# Patient Record
Sex: Male | Born: 1950 | Race: White | Hispanic: No | Marital: Married | Smoking: Current every day smoker
Health system: Southern US, Community
[De-identification: ages and names within clinical notes are randomized; demographics above are authoritative.]

## PROBLEM LIST (undated history)

## (undated) DIAGNOSIS — N184 Chronic kidney disease, stage 4 (severe): Secondary | ICD-10-CM

## (undated) DIAGNOSIS — I499 Cardiac arrhythmia, unspecified: Secondary | ICD-10-CM

## (undated) DIAGNOSIS — I471 Supraventricular tachycardia, unspecified: Secondary | ICD-10-CM

## (undated) DIAGNOSIS — I10 Essential (primary) hypertension: Secondary | ICD-10-CM

## (undated) DIAGNOSIS — J40 Bronchitis, not specified as acute or chronic: Secondary | ICD-10-CM

## (undated) DIAGNOSIS — J449 Chronic obstructive pulmonary disease, unspecified: Secondary | ICD-10-CM

## (undated) DIAGNOSIS — I5030 Unspecified diastolic (congestive) heart failure: Secondary | ICD-10-CM

## (undated) DIAGNOSIS — R058 Other specified cough: Secondary | ICD-10-CM

## (undated) DIAGNOSIS — I2510 Atherosclerotic heart disease of native coronary artery without angina pectoris: Secondary | ICD-10-CM

## (undated) DIAGNOSIS — E785 Hyperlipidemia, unspecified: Secondary | ICD-10-CM

## (undated) DIAGNOSIS — T464X5A Adverse effect of angiotensin-converting-enzyme inhibitors, initial encounter: Secondary | ICD-10-CM

## (undated) DIAGNOSIS — M1990 Unspecified osteoarthritis, unspecified site: Secondary | ICD-10-CM

## (undated) DIAGNOSIS — I509 Heart failure, unspecified: Secondary | ICD-10-CM

## (undated) DIAGNOSIS — I779 Disorder of arteries and arterioles, unspecified: Secondary | ICD-10-CM

## (undated) DIAGNOSIS — F419 Anxiety disorder, unspecified: Secondary | ICD-10-CM

## (undated) DIAGNOSIS — J189 Pneumonia, unspecified organism: Secondary | ICD-10-CM

## (undated) DIAGNOSIS — I219 Acute myocardial infarction, unspecified: Secondary | ICD-10-CM

## (undated) DIAGNOSIS — N183 Chronic kidney disease, stage 3 unspecified: Secondary | ICD-10-CM

## (undated) DIAGNOSIS — Z720 Tobacco use: Secondary | ICD-10-CM

## (undated) DIAGNOSIS — I480 Paroxysmal atrial fibrillation: Secondary | ICD-10-CM

## (undated) DIAGNOSIS — I272 Pulmonary hypertension, unspecified: Secondary | ICD-10-CM

## (undated) DIAGNOSIS — D649 Anemia, unspecified: Secondary | ICD-10-CM

## (undated) DIAGNOSIS — E119 Type 2 diabetes mellitus without complications: Secondary | ICD-10-CM

## (undated) DIAGNOSIS — R05 Cough: Secondary | ICD-10-CM

## (undated) HISTORY — DX: Cough: R05

## (undated) HISTORY — DX: Chronic kidney disease, stage 4 (severe): N18.4

## (undated) HISTORY — DX: Supraventricular tachycardia, unspecified: I47.10

## (undated) HISTORY — DX: Disorder of arteries and arterioles, unspecified: I77.9

## (undated) HISTORY — DX: Chronic obstructive pulmonary disease, unspecified: J44.9

## (undated) HISTORY — DX: Supraventricular tachycardia: I47.1

## (undated) HISTORY — DX: Other specified cough: R05.8

## (undated) HISTORY — DX: Paroxysmal atrial fibrillation: I48.0

## (undated) HISTORY — DX: Hyperlipidemia, unspecified: E78.5

## (undated) HISTORY — DX: Type 2 diabetes mellitus without complications: E11.9

## (undated) HISTORY — DX: Unspecified diastolic (congestive) heart failure: I50.30

## (undated) HISTORY — DX: Pulmonary hypertension, unspecified: I27.20

## (undated) HISTORY — DX: Tobacco use: Z72.0

## (undated) HISTORY — DX: Cardiac arrhythmia, unspecified: I49.9

## (undated) HISTORY — DX: Adverse effect of angiotensin-converting-enzyme inhibitors, initial encounter: T46.4X5A

## (undated) HISTORY — DX: Heart failure, unspecified: I50.9

## (undated) HISTORY — DX: Unspecified osteoarthritis, unspecified site: M19.90

## (undated) HISTORY — DX: Chronic kidney disease, stage 3 unspecified: N18.30

## (undated) HISTORY — DX: Anemia, unspecified: D64.9

## (undated) HISTORY — DX: Atherosclerotic heart disease of native coronary artery without angina pectoris: I25.10

## (undated) HISTORY — DX: Essential (primary) hypertension: I10

## (undated) HISTORY — DX: Anxiety disorder, unspecified: F41.9

## (undated) HISTORY — PX: ESOPHAGOGASTRODUODENOSCOPY ENDOSCOPY: SHX5814

---
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
## 1999-07-29 DIAGNOSIS — I2510 Atherosclerotic heart disease of native coronary artery without angina pectoris: Secondary | ICD-10-CM

## 1999-07-29 HISTORY — DX: Atherosclerotic heart disease of native coronary artery without angina pectoris: I25.10

## 2000-05-06 HISTORY — PX: CARDIAC CATHETERIZATION: SHX172

## 2001-04-27 ENCOUNTER — Encounter: Payer: Self-pay | Admitting: Family Medicine

## 2001-04-27 LAB — CONVERTED CEMR LAB: PSA: 0.4 ng/mL

## 2001-11-25 ENCOUNTER — Encounter: Payer: Self-pay | Admitting: Family Medicine

## 2002-07-28 ENCOUNTER — Encounter: Payer: Self-pay | Admitting: Family Medicine

## 2003-04-28 ENCOUNTER — Encounter: Payer: Self-pay | Admitting: Family Medicine

## 2003-08-29 ENCOUNTER — Encounter: Payer: Self-pay | Admitting: Family Medicine

## 2004-06-18 ENCOUNTER — Ambulatory Visit: Payer: Self-pay | Admitting: Physician Assistant

## 2004-07-18 ENCOUNTER — Other Ambulatory Visit: Payer: Self-pay

## 2004-07-25 ENCOUNTER — Ambulatory Visit: Payer: Self-pay | Admitting: Unknown Physician Specialty

## 2004-07-25 HISTORY — PX: KNEE ARTHROSCOPY: SUR90

## 2004-09-25 ENCOUNTER — Encounter: Payer: Self-pay | Admitting: Family Medicine

## 2004-09-25 LAB — CONVERTED CEMR LAB
Hgb A1c MFr Bld: 7.8 %
Hgb A1c MFr Bld: 7.8 %

## 2004-09-27 ENCOUNTER — Ambulatory Visit: Payer: Self-pay | Admitting: Family Medicine

## 2004-11-01 ENCOUNTER — Ambulatory Visit: Payer: Self-pay | Admitting: Family Medicine

## 2004-11-22 ENCOUNTER — Ambulatory Visit: Payer: Self-pay | Admitting: Family Medicine

## 2004-12-26 ENCOUNTER — Encounter: Payer: Self-pay | Admitting: Family Medicine

## 2005-01-01 ENCOUNTER — Ambulatory Visit: Payer: Self-pay | Admitting: Family Medicine

## 2005-01-03 ENCOUNTER — Ambulatory Visit: Payer: Self-pay | Admitting: Family Medicine

## 2005-02-25 ENCOUNTER — Encounter: Payer: Self-pay | Admitting: Family Medicine

## 2005-02-25 LAB — CONVERTED CEMR LAB: PSA: 0.5 ng/mL

## 2005-03-14 ENCOUNTER — Ambulatory Visit: Payer: Self-pay | Admitting: Family Medicine

## 2005-03-18 ENCOUNTER — Ambulatory Visit: Payer: Self-pay | Admitting: Family Medicine

## 2005-04-25 ENCOUNTER — Ambulatory Visit: Payer: Self-pay | Admitting: Family Medicine

## 2005-08-28 ENCOUNTER — Encounter: Payer: Self-pay | Admitting: Family Medicine

## 2005-08-28 LAB — CONVERTED CEMR LAB
Hgb A1c MFr Bld: 6.7 %
Hgb A1c MFr Bld: 6.7 %

## 2005-09-16 ENCOUNTER — Ambulatory Visit: Payer: Self-pay | Admitting: Family Medicine

## 2005-09-18 ENCOUNTER — Ambulatory Visit: Payer: Self-pay | Admitting: Family Medicine

## 2006-06-27 ENCOUNTER — Encounter: Payer: Self-pay | Admitting: Family Medicine

## 2006-06-27 LAB — CONVERTED CEMR LAB
Hgb A1c MFr Bld: 7.1 %
Microalbumin U total vol: 3.2 mg/L

## 2006-07-06 ENCOUNTER — Ambulatory Visit: Payer: Self-pay | Admitting: Family Medicine

## 2006-07-08 ENCOUNTER — Ambulatory Visit: Payer: Self-pay | Admitting: Family Medicine

## 2006-08-06 ENCOUNTER — Ambulatory Visit: Payer: Self-pay | Admitting: Family Medicine

## 2007-01-06 ENCOUNTER — Ambulatory Visit: Payer: Self-pay | Admitting: Family Medicine

## 2007-01-06 DIAGNOSIS — IMO0002 Reserved for concepts with insufficient information to code with codable children: Secondary | ICD-10-CM | POA: Insufficient documentation

## 2007-01-06 DIAGNOSIS — E1159 Type 2 diabetes mellitus with other circulatory complications: Secondary | ICD-10-CM

## 2007-01-06 DIAGNOSIS — E119 Type 2 diabetes mellitus without complications: Secondary | ICD-10-CM | POA: Insufficient documentation

## 2007-01-06 DIAGNOSIS — N521 Erectile dysfunction due to diseases classified elsewhere: Secondary | ICD-10-CM

## 2007-01-06 DIAGNOSIS — F17200 Nicotine dependence, unspecified, uncomplicated: Secondary | ICD-10-CM | POA: Insufficient documentation

## 2007-01-06 DIAGNOSIS — R454 Irritability and anger: Secondary | ICD-10-CM | POA: Insufficient documentation

## 2007-01-06 DIAGNOSIS — E785 Hyperlipidemia, unspecified: Secondary | ICD-10-CM | POA: Insufficient documentation

## 2007-01-06 DIAGNOSIS — Z720 Tobacco use: Secondary | ICD-10-CM | POA: Insufficient documentation

## 2007-01-06 DIAGNOSIS — J449 Chronic obstructive pulmonary disease, unspecified: Secondary | ICD-10-CM | POA: Insufficient documentation

## 2007-01-06 DIAGNOSIS — E1165 Type 2 diabetes mellitus with hyperglycemia: Secondary | ICD-10-CM

## 2007-01-06 LAB — CONVERTED CEMR LAB: Hgb A1c MFr Bld: 8.3 % — ABNORMAL HIGH (ref 4.6–6.0)

## 2007-01-07 DIAGNOSIS — I10 Essential (primary) hypertension: Secondary | ICD-10-CM | POA: Insufficient documentation

## 2007-01-08 ENCOUNTER — Encounter (INDEPENDENT_AMBULATORY_CARE_PROVIDER_SITE_OTHER): Payer: Self-pay | Admitting: *Deleted

## 2007-01-12 ENCOUNTER — Telehealth (INDEPENDENT_AMBULATORY_CARE_PROVIDER_SITE_OTHER): Payer: Self-pay | Admitting: *Deleted

## 2007-01-12 ENCOUNTER — Ambulatory Visit: Payer: Self-pay | Admitting: Family Medicine

## 2007-03-09 ENCOUNTER — Ambulatory Visit: Payer: Self-pay | Admitting: Oncology

## 2007-06-16 ENCOUNTER — Telehealth: Payer: Self-pay | Admitting: Family Medicine

## 2007-07-01 ENCOUNTER — Ambulatory Visit: Payer: Self-pay | Admitting: Family Medicine

## 2007-07-01 LAB — CONVERTED CEMR LAB
ALT: 32 units/L (ref 0–53)
AST: 23 units/L (ref 0–37)
Albumin: 4 g/dL (ref 3.5–5.2)
Alkaline Phosphatase: 44 units/L (ref 39–117)
BUN: 11 mg/dL (ref 6–23)
Basophils Absolute: 0 10*3/uL (ref 0.0–0.1)
Calcium: 9.3 mg/dL (ref 8.4–10.5)
Chloride: 102 meq/L (ref 96–112)
Creatinine, Ser: 1.2 mg/dL (ref 0.4–1.5)
Direct LDL: 96.8 mg/dL
GFR calc non Af Amer: 66 mL/min
HCT: 46.5 % (ref 39.0–52.0)
HDL: 25.6 mg/dL — ABNORMAL LOW (ref >39.0)
Hgb A1c MFr Bld: 8.4 % — ABNORMAL HIGH (ref 4.6–6.0)
MCHC: 35.3 g/dL (ref 30.0–36.0)
Monocytes Relative: 8.1 % (ref 3.0–11.0)
Neutrophils Relative %: 56.2 % (ref 43.0–77.0)
PSA: 0.4 ng/mL (ref 0.10–4.00)
Platelets: 238 10*3/uL (ref 150–400)
RBC: 5.39 M/uL (ref 4.22–5.81)
RDW: 12.9 % (ref 11.5–14.6)
Total Bilirubin: 0.7 mg/dL (ref 0.3–1.2)
Total CHOL/HDL Ratio: 6
Triglycerides: 214 mg/dL (ref 0–149)

## 2007-07-27 ENCOUNTER — Ambulatory Visit: Payer: Self-pay | Admitting: Family Medicine

## 2007-08-05 ENCOUNTER — Telehealth: Payer: Self-pay | Admitting: Family Medicine

## 2007-10-18 ENCOUNTER — Ambulatory Visit: Payer: Self-pay | Admitting: Family Medicine

## 2007-10-18 LAB — CONVERTED CEMR LAB: Hgb A1c MFr Bld: 8.1 % — ABNORMAL HIGH (ref 4.6–6.0)

## 2007-10-22 ENCOUNTER — Telehealth: Payer: Self-pay | Admitting: Internal Medicine

## 2007-10-25 ENCOUNTER — Ambulatory Visit: Payer: Self-pay | Admitting: Family Medicine

## 2007-10-28 ENCOUNTER — Telehealth: Payer: Self-pay | Admitting: Family Medicine

## 2007-12-06 ENCOUNTER — Telehealth: Payer: Self-pay | Admitting: Family Medicine

## 2007-12-22 ENCOUNTER — Telehealth: Payer: Self-pay | Admitting: Family Medicine

## 2007-12-28 ENCOUNTER — Telehealth: Payer: Self-pay | Admitting: Family Medicine

## 2008-01-20 ENCOUNTER — Ambulatory Visit: Payer: Self-pay | Admitting: Family Medicine

## 2008-01-20 LAB — CONVERTED CEMR LAB: Hgb A1c MFr Bld: 8.3 % — ABNORMAL HIGH (ref 4.6–6.0)

## 2008-01-25 ENCOUNTER — Ambulatory Visit: Payer: Self-pay | Admitting: Family Medicine

## 2008-05-17 ENCOUNTER — Ambulatory Visit: Payer: Self-pay | Admitting: Family Medicine

## 2008-06-26 ENCOUNTER — Ambulatory Visit: Payer: Self-pay | Admitting: Family Medicine

## 2008-06-26 LAB — CONVERTED CEMR LAB
ALT: 34 units/L (ref 0–53)
AST: 20 units/L (ref 0–37)
Albumin: 4 g/dL (ref 3.5–5.2)
BUN: 14 mg/dL (ref 6–23)
Basophils Absolute: 0 10*3/uL (ref 0.0–0.1)
Basophils Relative: 0.4 % (ref 0.0–3.0)
CO2: 31 meq/L (ref 19–32)
Calcium: 9.3 mg/dL (ref 8.4–10.5)
Chloride: 103 meq/L (ref 96–112)
Creatinine, Ser: 1.2 mg/dL (ref 0.4–1.5)
Creatinine,U: 48.4 mg/dL
Eosinophils Absolute: 0.2 10*3/uL (ref 0.0–0.7)
Eosinophils Relative: 2.5 % (ref 0.0–5.0)
Hemoglobin: 16.9 g/dL (ref 13.0–17.0)
Lymphocytes Relative: 33.1 % (ref 12.0–46.0)
MCHC: 34 g/dL (ref 30.0–36.0)
MCV: 86.4 fL (ref 78.0–100.0)
Microalb, Ur: 0.6 mg/dL (ref 0.0–1.9)
Neutro Abs: 3.8 10*3/uL (ref 1.4–7.7)
Neutrophils Relative %: 56.2 % (ref 43.0–77.0)
RBC: 5.76 M/uL (ref 4.22–5.81)
Total Bilirubin: 0.6 mg/dL (ref 0.3–1.2)
Total Protein: 6.9 g/dL (ref 6.0–8.3)
WBC: 6.7 10*3/uL (ref 4.5–10.5)

## 2008-06-29 ENCOUNTER — Ambulatory Visit: Payer: Self-pay | Admitting: Family Medicine

## 2008-07-10 ENCOUNTER — Telehealth: Payer: Self-pay | Admitting: Family Medicine

## 2008-07-12 ENCOUNTER — Telehealth: Payer: Self-pay | Admitting: Family Medicine

## 2008-07-20 ENCOUNTER — Ambulatory Visit: Payer: Self-pay | Admitting: Family Medicine

## 2008-07-20 ENCOUNTER — Encounter (INDEPENDENT_AMBULATORY_CARE_PROVIDER_SITE_OTHER): Payer: Self-pay | Admitting: *Deleted

## 2008-07-20 LAB — CONVERTED CEMR LAB
OCCULT 1: NEGATIVE
OCCULT 2: NEGATIVE
OCCULT 3: NEGATIVE

## 2008-08-07 ENCOUNTER — Ambulatory Visit: Payer: Self-pay | Admitting: Family Medicine

## 2008-08-07 LAB — CONVERTED CEMR LAB: Hgb A1c MFr Bld: 7.7 % — ABNORMAL HIGH (ref 4.6–6.0)

## 2008-08-10 ENCOUNTER — Ambulatory Visit: Payer: Self-pay | Admitting: Family Medicine

## 2008-10-31 ENCOUNTER — Ambulatory Visit: Payer: Self-pay | Admitting: Family Medicine

## 2008-12-14 ENCOUNTER — Telehealth: Payer: Self-pay | Admitting: Family Medicine

## 2009-01-02 ENCOUNTER — Ambulatory Visit: Payer: Self-pay | Admitting: Family Medicine

## 2009-01-18 ENCOUNTER — Telehealth: Payer: Self-pay | Admitting: Family Medicine

## 2009-04-26 ENCOUNTER — Telehealth: Payer: Self-pay | Admitting: Family Medicine

## 2009-07-09 ENCOUNTER — Telehealth: Payer: Self-pay | Admitting: Family Medicine

## 2009-07-13 ENCOUNTER — Ambulatory Visit: Payer: Self-pay | Admitting: Family Medicine

## 2009-07-19 ENCOUNTER — Encounter: Payer: Self-pay | Admitting: Family Medicine

## 2009-09-06 ENCOUNTER — Telehealth: Payer: Self-pay | Admitting: Family Medicine

## 2009-09-17 ENCOUNTER — Telehealth: Payer: Self-pay | Admitting: Family Medicine

## 2009-10-12 ENCOUNTER — Telehealth: Payer: Self-pay | Admitting: Family Medicine

## 2009-11-19 ENCOUNTER — Telehealth: Payer: Self-pay | Admitting: Family Medicine

## 2009-11-21 ENCOUNTER — Ambulatory Visit: Payer: Self-pay | Admitting: Family Medicine

## 2009-11-21 LAB — CONVERTED CEMR LAB
AST: 24 units/L (ref 0–37)
Albumin: 4.2 g/dL (ref 3.5–5.2)
BUN: 14 mg/dL (ref 6–23)
Basophils Absolute: 0.1 10*3/uL (ref 0.0–0.1)
Calcium: 9.4 mg/dL (ref 8.4–10.5)
Cholesterol: 169 mg/dL (ref 0–200)
Creatinine, Ser: 1.2 mg/dL (ref 0.4–1.5)
Creatinine,U: 87.7 mg/dL
Eosinophils Absolute: 0.2 10*3/uL (ref 0.0–0.7)
GFR calc non Af Amer: 65.57 mL/min (ref >60)
Glucose, Bld: 173 mg/dL — ABNORMAL HIGH (ref 70–99)
Hemoglobin: 16.5 g/dL (ref 13.0–17.0)
Hgb A1c MFr Bld: 8.4 % — ABNORMAL HIGH (ref 4.6–6.5)
Lymphocytes Relative: 34.2 % (ref 12.0–46.0)
Lymphs Abs: 2.4 10*3/uL (ref 0.7–4.0)
MCHC: 34 g/dL (ref 30.0–36.0)
Microalb, Ur: 1.1 mg/dL (ref 0.0–1.9)
Neutro Abs: 4 10*3/uL (ref 1.4–7.7)
Platelets: 253 10*3/uL (ref 150.0–400.0)
Potassium: 4.8 meq/L (ref 3.5–5.1)
RDW: 14.1 % (ref 11.5–14.6)
TSH: 1.65 microintl units/mL (ref 0.35–5.50)
Total Bilirubin: 0.6 mg/dL (ref 0.3–1.2)
Triglycerides: 265 mg/dL — ABNORMAL HIGH (ref 0.0–149.0)
VLDL: 53 mg/dL — ABNORMAL HIGH (ref 0.0–40.0)

## 2009-11-28 ENCOUNTER — Ambulatory Visit: Payer: Self-pay | Admitting: Family Medicine

## 2009-12-26 ENCOUNTER — Ambulatory Visit: Payer: Self-pay | Admitting: Family Medicine

## 2009-12-26 ENCOUNTER — Encounter: Payer: Self-pay | Admitting: Family Medicine

## 2010-01-01 ENCOUNTER — Ambulatory Visit: Payer: Self-pay | Admitting: Family Medicine

## 2010-01-01 LAB — CONVERTED CEMR LAB
OCCULT 2: NEGATIVE
OCCULT 3: NEGATIVE

## 2010-01-01 LAB — FECAL OCCULT BLOOD, GUAIAC: Fecal Occult Blood: NEGATIVE

## 2010-01-02 ENCOUNTER — Encounter (INDEPENDENT_AMBULATORY_CARE_PROVIDER_SITE_OTHER): Payer: Self-pay | Admitting: *Deleted

## 2010-01-29 ENCOUNTER — Telehealth: Payer: Self-pay | Admitting: Family Medicine

## 2010-02-26 ENCOUNTER — Ambulatory Visit: Payer: Self-pay | Admitting: Family Medicine

## 2010-03-04 ENCOUNTER — Telehealth: Payer: Self-pay | Admitting: Family Medicine

## 2010-03-05 ENCOUNTER — Ambulatory Visit: Payer: Self-pay | Admitting: Family Medicine

## 2010-03-05 LAB — HM DIABETES FOOT EXAM

## 2010-03-13 ENCOUNTER — Telehealth: Payer: Self-pay | Admitting: Family Medicine

## 2010-03-25 ENCOUNTER — Telehealth: Payer: Self-pay | Admitting: Family Medicine

## 2010-04-17 ENCOUNTER — Ambulatory Visit: Payer: Self-pay | Admitting: Gastroenterology

## 2010-04-17 ENCOUNTER — Encounter (INDEPENDENT_AMBULATORY_CARE_PROVIDER_SITE_OTHER): Payer: Self-pay | Admitting: *Deleted

## 2010-05-29 ENCOUNTER — Telehealth: Payer: Self-pay | Admitting: Family Medicine

## 2010-06-03 ENCOUNTER — Ambulatory Visit: Payer: Self-pay | Admitting: Gastroenterology

## 2010-06-03 LAB — HM COLONOSCOPY

## 2010-06-05 ENCOUNTER — Encounter: Payer: Self-pay | Admitting: Gastroenterology

## 2010-08-01 ENCOUNTER — Telehealth: Payer: Self-pay | Admitting: Family Medicine

## 2010-08-09 ENCOUNTER — Telehealth: Payer: Self-pay | Admitting: Family Medicine

## 2010-08-28 NOTE — Progress Notes (Signed)
Summary: refill request for xanax  Phone Note Refill Request   Refills Requested: Medication #1:  XANAX 0.5 MG TABS Take 1 tablet by mouth every six hours as needed   Last Refilled: 11/19/2009 Faxed request from [facility], [phone].  Initial call taken by: Marty Heck CMA,  January 29, 2010 9:47 AM  Follow-up for Phone Call        Medication phoned to pharmacy.  Follow-up by: Christena Deem CMA Deborra Medina),  January 29, 2010 2:31 PM    Prescriptions: XANAX 0.5 MG TABS (ALPRAZOLAM) Take 1 tablet by mouth every six hours as needed  #50 x 0   Entered and Authorized by:   Elsie Stain MD   Signed by:   Elsie Stain MD on 01/29/2010   Method used:   Telephoned to ...       [id number]       [address], [place]  [address]       Ph: [phone] or [id number]       Fax: [phone]   RxID:   [id number]

## 2010-08-28 NOTE — Progress Notes (Signed)
Summary: fenofibrate  Phone Note Refill Request Call back at Home Phone [phone] Message from:  Patient on March 13, 2010 9:27 AM  Refills Requested: Medication #1:  FENOFIBRATE 160 MG TABS one tab by mouth daily Patient was given rx on 5-4, patient says that he never had it filled and lost the rx. He is [facility] [address]   Initial call taken by: Lacretia Nicks,  March 13, 2010 9:27 AM    Prescriptions: FENOFIBRATE 160 MG TABS (FENOFIBRATE) one tab by mouth daily  #90 x 3   Entered and Authorized by:   Elsie Stain MD   Signed by:   Elsie Stain MD on 03/13/2010   Method used:   [facility]ronically [facility]        [id number]       [address], [place]  [address]       Ph: [phone] or [id number]       Fax: [phone]   RxID:   [id number]

## 2010-08-28 NOTE — Letter (Signed)
Summary: Results Follow up Letter  [place] [facility]  [address] [place], [place] [address]   Phone: [phone]  Fax: [phone]    01/02/2010 MRN: [id number]  [facility] [address] [place], [place]  [address]  Dear Mr. Braniff,  The following are the results of your recent test(s):  Test         Result    Pap Smear:        Normal _____  Not Normal _____ Comments: ______________________________________________________ Cholesterol: LDL(Bad cholesterol):         Your goal is less than:         HDL (Good cholesterol):       Your goal is more than: Comments:  ______________________________________________________ Mammogram:        Normal _____  Not Normal _____ Comments:  ___________________________________________________________________ Hemoccult:        Normal ___X__  Not normal _______ Comments:  Please repeat in one year.  _____________________________________________________________________ Other Tests:    We routinely do not discuss normal results over the telephone.  If you desire a copy of the results, or you have any questions about this information we can discuss them at your next office visit.   Sincerely,    Teresa Pelton, MD

## 2010-08-28 NOTE — Letter (Signed)
Summary: Anticoagulation Modification Letter  [facility]  [place], [place] [address]   Phone: [phone]  Fax: [phone]    April 17, 2010  Re:    Joshua Roy DOB:    1950-12-05 MRN:  [id number]    Dear Dr. Damita Dunnings:  We have scheduled the above patient for an endoscopic procedure with Dr. Ardis Hughs. Our records show that he is on anticoagulation therapy. Please advise as to how long the patient may come off their therapy of Plavix prior to the scheduled procedure on June 03, 2010   Please fax back the completed form to Christian Mate, [place] at [phone].  Thank you for your help with this matter.  Sincerely,  Abelino Derrick CMA Deborra Medina)   Physician Recommendation:  Hold Plavix 5 days prior ________________   Appended Document: Anticoagulation Modification Letter Pt is to hold plavix for 5 days before procedure.  Please fax back.   Appended Document: Anticoagulation Modification Letter Faxed.  Appended Document: Anticoagulation Modification Letter not sure if you saw this was returned  Appended Document: Anticoagulation Modification Letter left message on machine to call back   Appended Document: Anticoagulation Modification Letter pt aware of the plavix hold

## 2010-08-28 NOTE — Progress Notes (Signed)
Summary: Rx Xanax  Phone Note Refill Request Call back at [phone] Message from:  Gerrard on November 19, 2009 9:33 AM  Refills Requested: Medication #1:  XANAX 0.5 MG TABS Take 1 tablet by mouth every six hours as needed   Last Refilled: 10/12/2009 Received faxed refill request   Method Requested: Telephone to Pharmacy Initial call taken by: Sherrian Divers CMA Deborra Medina),  November 19, 2009 9:33 AM  Follow-up for Phone Call        Pls let pt know, he needs to be seen for next refill. Follow-up by: Raenette Rover MD,  November 19, 2009 1:12 PM  Additional Follow-up for Phone Call Additional follow up Details #1::        Rx called to pharmacy, left message on machine at home for patient to return call.  Sherrian Divers CMA Deborra Medina)  November 19, 2009 1:25 PM   Advised pt's wife.         Marty Heck CMA  November 20, 2009 8:19 AM     Prescriptions: Duanne Moron 0.5 MG TABS (ALPRAZOLAM) Take 1 tablet by mouth every six hours as needed  #50 x 0   Entered and Authorized by:   Raenette Rover MD   Signed by:   Raenette Rover MD on 11/19/2009   Method used:   Telephoned to ...       [id number]       [address] [place], [place]  [address]       Ph: [phone] or [id number]       Fax: [phone]   RxID:   [id number]

## 2010-08-28 NOTE — Progress Notes (Signed)
Summary: refill request for alprazolam  Phone Note Refill Request Message from:  Fax from Pharmacy  Refills Requested: Medication #1:  XANAX 0.5 MG TABS Take 1 tablet by mouth every six hours as needed   Last Refilled: 01/29/2010 Faxed request from [facility], [phone].  Initial call taken by: Marty Heck CMA,  March 25, 2010 9:17 AM  Follow-up for Phone Call        please call in.  Follow-up by: Elsie Stain MD,  March 25, 2010 1:53 PM  Additional Follow-up for Phone Call Additional follow up Details #1::        Medication phoned to pharmacy.  Additional Follow-up by: Christena Deem CMA Deborra Medina),  March 25, 2010 2:44 PM    New/Updated Medications: XANAX 0.5 MG TABS (ALPRAZOLAM) Take 1 tablet by mouth every six hours as needed Prescriptions: XANAX 0.5 MG TABS (ALPRAZOLAM) Take 1 tablet by mouth every six hours as needed  #50 x 0   Entered and Authorized by:   Elsie Stain MD   Signed by:   Elsie Stain MD on 03/25/2010   Method used:   Telephoned to ...       [id number]       [address], [place]  [address]       Ph: [phone] or [phone]       Fax: [phone]   RxID:   [id number]

## 2010-08-28 NOTE — Assessment & Plan Note (Signed)
Summary: CPX/CLE   Vital Signs:  Patient profile:   60 year old male Weight:      255 pounds Temp:     98.2 degrees F oral Pulse rate:   80 / minute Pulse rhythm:   regular BP sitting:   140 / 88  (left arm) Cuff size:   large  Vitals Entered By: Emelia Salisbury LPN (May  4, [id number] PM) CC: 30 Minute checkup, hemoccult cards given to patient   History of Present Illness: Pt here for Comp Exam. His use of Xanax has increased because his wife has been using them.  He is having ringing in the ears. He works in a lab with high speed equipment. He has a mole on his back. He has bilat knee pain. He used Vicodin he had been given for back pain. He fell off wagon in thanksgiving holiday time and started watching carefully a few weks ago.  Preventive Screening-Counseling & Management  Alcohol-Tobacco     Alcohol drinks/day: <1     Alcohol type: [place]     Smoking Status: current     Smoking Cessation Counseling: yes     Packs/Day: 1.0+     Year Started: 1968     Cans of tobacco/week: no     Passive Smoke Exposure: no  Caffeine-Diet-Exercise     Caffeine use/day: 3     Does Patient Exercise: no  Problems Prior to Update: 1)  Neck Pain  (ICD-723.1) 2)  Bronchitis, Acute  (ICD-466.0) 3)  Special Screening Malig Neoplasms Other Sites  (ICD-V76.49) 4)  Health Maintenance Exam  (ICD-V70.0) 5)  Special Screening Malignant Neoplasm of Prostate  (ICD-V76.44) 6)  Disorder, Tobacco Use  (ICD-305.1) 7)  Personal Problem  () 8)  Hypertension  (ICD-401.9) 9)  Hyperlipidemia  (ICD-272.4) 10)  Coronary Artery Disease Non Q Wave Mi  (ICD-414.00) 11)  COPD  (ICD-496) 12)  Anxiety  (ICD-300.00) 13)  Diabetes Mellitus, Type II  (ICD-250.00)  Medications Prior to Update: 1)  Viagra 100 Mg Tabs (Sildenafil Citrate) .... 1/2 Tablet 1 Hour Prior As Needed 2)  Simvastatin 80 Mg Tabs (Simvastatin) .Marland Kitchen.. 1 By Mouth At Bedtime 3)  Plavix 75 Mg Tabs (Clopidogrel Bisulfate) .Marland Kitchen.. 1 By Mouth Once  Daily 4)  Xanax 0.5 Mg Tabs (Alprazolam) .... Take 1 Tablet By Mouth Every Six Hours As Needed 5)  Aspir-Low 81 Mg Tbec (Aspirin) .Marland Kitchen.. 1 By Mouth Daily 6)  Multivitamins  Tabs (Multiple Vitamin) .Marland Kitchen.. 1 By Mouth Daily 7)  Fish Oil 1000 Mg Caps (Omega-3 Fatty Acids) .Marland Kitchen.. 1 By Mouth Two Times A Day 8)  Glucophage 1000 Mg  Tabs (Metformin Hcl) .... One Tab By Mouth Two Times A Day 9)  Accu-Chek Active   Strp (Glucose Blood) .... Check Daily 10)  Tricor 145 Mg  Tabs (Fenofibrate) .Marland Kitchen.. 1 Daily By Mouth 11)  Cyclobenzaprine Hcl 10 Mg  Tabs (Cyclobenzaprine Hcl) .Marland Kitchen.. 1 By Mouth 2 Times Daily As Needed For Back Pain 12)  Hydrocodone-Acetaminophen 5-325 Mg Tabs (Hydrocodone-Acetaminophen) .Marland Kitchen.. 1 By Mouth Up To 4 Times Per Day As Needed For Pain  Allergies: 1)  ! Altace 2)  ! 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 hypokinesis EF  49% 100% ROA 11/01        ETT nml. Fair exercise for age 41/28/01   Echo nml. EF 55% 05/13/02   Exercise cardiolite (+) EF 46% 07/25/04   Left knee arthroscopy  Family History: Last updated: 11/28/2009 Father: Dec. 76 CAD, MI x 3, HTN Mother: Alive 26 Siblings: 7     l Sister DM, died 2023/05/27    1 Brother  A 69  Lymphoma, remission    1 Sister A 66  CABG x 3    4 others okay CV:  (+) father BP:  (+) father DM:  (+) sister CA:  (+) brother lymphoma ETOH:  (+) father  Social History: Last updated: 01/06/2007 Marital Status: Married, lives with wife Children: 3  Occupation: Dental Tech/Dental Lab Current Smoker, 2 PPD since 40 Alcohol use-yes, 5-6 cocktails 2 x/week Drug use-no  Risk Factors: Alcohol Use: <1 (11/28/2009) Caffeine Use: 3 (11/28/2009) Exercise: no (11/28/2009)  Risk Factors: Smoking Status:  current (11/28/2009) Packs/Day: 1.0+ (11/28/2009) Cans of tobacco/wk: no (11/28/2009) Passive Smoke Exposure: no (11/28/2009)  Family History: Father: Dec. 76 CAD, MI x 3, HTN Mother: Alive 27 Siblings: 7     l Sister DM, died 2023-05-27    1 Brother  A 56  Lymphoma, remission    1 Sister A 66  CABG x 3    4 others okay CV:  (+) father BP:  (+) father DM:  (+) sister CA:  (+) brother lymphoma ETOH:  (+) father  Social History: Packs/Day:  1.0+  Review of Systems General:  Complains of fatigue; denies chills, fever, sweats, weakness, and weight loss. Eyes:  Denies blurring, discharge, and eye pain. ENT:  Complains of ringing in ears; denies decreased hearing and earache. CV:  Complains of shortness of breath with exertion; denies chest pain or discomfort, fainting, fatigue, palpitations, swelling of feet, and swelling of hands. Resp:  Denies cough, shortness of breath, and wheezing. GI:  Complains of hemorrhoids; denies abdominal pain, bloody stools, change in bowel habits, constipation, dark tarry stools, diarrhea, indigestion, loss of appetite, nausea, vomiting, vomiting blood, and yellowish skin color. GU:  Denies discharge, dysuria, nocturia, and urinary frequency. MS:  Complains of joint pain and stiffness; denies low back pain, muscle aches, and cramps; knees . Derm:  Denies dryness, itching, and rash; mole on back. Neuro:  Denies numbness, poor balance, tingling, and tremors.  Physical Exam  General:  Well-developed,well-nourished,in no acute distress; alert,appropriate and cooperative throughout examination, obese. Head:  Normocephalic and atraumatic without obvious abnormalities. No apparent alopecia or balding. Sinuses NT. Eyes:  Conjunctiva clear bilaterally.  Ears:  External ear exam shows no significant lesions or deformities.  Otoscopic examination reveals clear canals, tympanic membranes are intact bilaterally without bulging, retraction, inflammation or discharge.  Hearing is grossly normal bilaterally. Mild to mod cerumen bilat. Nose:  External nasal examination shows no deformity or inflammation. Nasal mucosa are pink and moist without lesions or exudates. Mouth:  Oral mucosa and oropharynx without lesions or exudates.  Teeth in good repair. Neck:  No deformities, masses, or tenderness noted. Chest Wall:  No deformities, masses, tenderness or gynecomastia noted. Breasts:  No masses or gynecomastia noted Lungs:  Normal respiratory effort, chest expands symmetrically. Lungs are clear to auscultation, no crackles or wheezes. Heart:  Normal rate and regular rhythm. S1 and S2 normal without gallop, murmur, click, rub or other extra sounds. Abdomen:  Bowel sounds positive,abdomen soft and non-tender without masses, organomegaly or hernias noted. Panniculous noted. Rectal:  No external abnormalities noted. Normal sphincter tone. No rectal masses or tenderness. G neg. Genitalia:  Testes bilaterally descended without nodularity, tenderness or masses. No scrotal masses or lesions. No penis lesions or urethral discharge. Prostate:  Prostate gland firm and smooth, no enlargement, nodularity, tenderness, mass, asymmetry or induration. 20-30gms. Msk:  No deformity or scoliosis noted of thoracic or lumbar spine.   Pulses:  R and L carotid,radial,femoral,dorsalis pedis and posterior tibial pulses are full and equal bilaterally Extremities:  No clubbing, cyanosis, edema, or deformity noted with normal full range of motion of all joints, knees slightly decreased.   Neurologic:  No cranial nerve deficits noted. Station and gait are normal. Plantar reflexes are down-going bilaterally. DTRs are symmetrical throughout. Sensory, motor and coordinative functions appear intact. Skin:  Intact without suspicious lesions or rashes, dry skin of LEs and feet bilat. Cervical Nodes:  No lymphadenopathy noted Inguinal Nodes:  No significant adenopathy Psych:  Cognition and judgment appear  intact. Alert and cooperative with normal attention span and concentration. No apparent delusions, illusions, hallucinations  Diabetes Management Exam:    Foot Exam (with socks and/or shoes not present):       Sensory-Pinprick/Light touch:          Left medial foot (L-4): normal          Left dorsal foot (L-5): normal          Left lateral foot (S-1): normal          Right medial foot (L-4): normal          Right dorsal foot (L-5): normal          Right lateral foot (S-1): normal       Sensory-Monofilament:          Left foot: normal          Right foot: normal       Sensory-other: Dry skin bilat.       Inspection:          Left foot: normal          Right foot: normal       Nails:          Left foot: normal          Right foot: normal   Impression & Recommendations:  Problem # 1:  HEALTH MAINTENANCE EXAM (ICD-V70.0)  Reviewed preventive care protocols, scheduled due services, and updated immunizations.  Problem # 2:  KNEE PAIN, BILATERAL L>R (ICD-719.46) Declines Referral at this point, will lose weight and see if improves. He is again motivated to get back on the routine. The following medications were removed from the medication list:    Cyclobenzaprine Hcl 10 Mg Tabs (Cyclobenzaprine hcl) .Marland Kitchen... 1 by mouth 2 times daily as needed for back pain    Hydrocodone-acetaminophen 5-325 Mg Tabs (Hydrocodone-acetaminophen) .Marland Kitchen... 1 by mouth up to 4 times per day as needed for pain His updated medication list for this problem includes:    Aspir-low 81 Mg Tbec (Aspirin) .Marland Kitchen... 1 by mouth daily  Problem # 3:  SPECIAL SCREENING MALIGNANT NEOPLASM OF PROSTATE (ICD-V76.44) Assessment: Unchanged Stable PSA and exam.  Problem # 4:  DISORDER, TOBACCO USE (ICD-305.1) Assessment: Improved  Is cutting down. Again encouraged to quit.  Encouraged smoking cessation and discussed different methods for smoking cessation.   Problem # 5:  * PERSONAL PROBLEM Assessment: Unchanged Stable but  needs to be careful with Xanax use. He understands and has been careful. He is not  the one who has been using his XAnax. He uses them very infrequently.  Problem # 6:  HYPERTENSION (ICD-401.9) Assessment: Improved But still high. He really should be on ACEI tx but continues to decline. He thinks he can get his BP ddown. I want him on irrespective of that but he declines. BP today: 140/88 Prior BP: 154/92 (07/13/2009)  Labs Reviewed: K+: 4.8 (11/21/2009) Creat: : 1.2 (11/21/2009)   Chol: 169 (11/21/2009)   HDL: 33.90 (11/21/2009)   LDL: DEL (06/26/2008)   TG: 265.0 (11/21/2009)  Problem # 7:  HYPERLIPIDEMIA (ICD-272.4) Assessment: Deteriorated Nos higher because his diet has been uncontrolled for months. Cont meds. Says he will get back in gear. His updated medication list for this problem includes:    Simvastatin 80 Mg Tabs (Simvastatin) .Marland Kitchen... 1 by mouth at bedtime    Fenofibrate 160 Mg Tabs (Fenofibrate) ..... One tab by mouth daily  Labs Reviewed: SGOT: 24 (11/21/2009)   SGPT: 31 (11/21/2009)   HDL:33.90 (11/21/2009), 32.7 (06/26/2008)  LDL:DEL (06/26/2008), DEL (07/01/2007)  Chol:169 (11/21/2009), 181 (06/26/2008)  Trig:265.0 (11/21/2009), 237 (06/26/2008)  Problem # 8:  CORONARY ARTERY DISEASE  NON Q WAVE MI (ICD-414.00) Assessment: Unchanged  Big risk. His updated medication list for this problem includes:    Plavix 75 Mg Tabs (Clopidogrel bisulfate) .Marland Kitchen... 1 by mouth once daily    Aspir-low 81 Mg Tbec (Aspirin) .Marland Kitchen... 1 by mouth daily  Labs Reviewed: Chol: 169 (11/21/2009)   HDL: 33.90 (11/21/2009)   LDL: DEL (06/26/2008)   TG: 265.0 (11/21/2009)  Problem # 9:  DIABETES MELLITUS, TYPE II (ICD-250.00) Assessment: Deteriorated Bad news...needs to be back in the groove...discussed at length. His updated medication list for this problem includes:    Aspir-low 81 Mg Tbec (Aspirin) .Marland Kitchen... 1 by mouth daily    Glucophage 1000 Mg Tabs (Metformin hcl) ..... One tab by mouth two times  a day  Labs Reviewed: Creat: 1.2 (11/21/2009)   Microalbumin: 3.2 (06/27/2006) Reviewed HgBA1c results: 8.4 (11/21/2009)  7.7 (08/07/2008)  Problem # 10:  ANXIETY (ICD-300.00) Assessment: Unchanged Stable. Needs to restrict use of Xanax to himself. His updated medication list for this problem includes:    Xanax 0.5 Mg Tabs (Alprazolam) .Marland Kitchen... Take 1 tablet by mouth every six hours as needed  Discussed medication use and relaxation techniques.   Problem # 11:  COPD (ICD-496) Assessment: Unchanged Will get much better if quits smoking. Vaccines Reviewed: Flu Vax: Fluvax 3+ (05/17/2008)  Complete Medication List: 1)  Viagra 100 Mg Tabs (Sildenafil citrate) .... 1/2 tablet 1 hour prior as needed 2)  Simvastatin 80 Mg Tabs (Simvastatin) .Marland Kitchen.. 1 by mouth at bedtime 3)  Plavix 75 Mg Tabs (Clopidogrel bisulfate) .Marland Kitchen.. 1 by mouth once daily 4)  Xanax 0.5 Mg Tabs (Alprazolam) .... Take 1 tablet by mouth every six hours as needed 5)  Aspir-low 81 Mg Tbec (Aspirin) .Marland Kitchen.. 1 by mouth daily 6)  Multivitamins Tabs (Multiple vitamin) .Marland Kitchen.. 1 by mouth daily 7)  Fish Oil 1000 Mg Caps (Omega-3 fatty acids) .Marland Kitchen.. 1 by mouth two times a day 8)  Glucophage 1000 Mg Tabs (Metformin hcl) .... One tab by mouth two times a day 9)  Accu-chek Active Strp (Glucose blood) .... Check daily 10)  Fenofibrate 160 Mg Tabs (Fenofibrate) .... One tab by mouth daily  Patient Instructions: 1)  RTC 3 mos with [name] 2)  A1C prior 250.00 Prescriptions: FENOFIBRATE 160 MG TABS (FENOFIBRATE) one tab by mouth daily  #90 x 3   Entered  and Authorized by:   Raenette Rover MD   Signed by:   Raenette Rover MD on 11/28/2009   Method used:   Print then Give to Patient   RxID:   [id number] GLUCOPHAGE 1000 MG  TABS (METFORMIN HCL) one tab by mouth two times a day  #180 x 3   Entered and Authorized by:   Raenette Rover MD   Signed by:   Raenette Rover MD on 11/28/2009   Method used:   [id number]       [address], [place]  [address]       Ph: [phone] or [id number]       Fax: [phone]   RxID:   [id number] PLAVIX 75 MG TABS (CLOPIDOGREL BISULFATE) 1 by mouth once daily  #90 Tablet x 3   Entered and Authorized by:   Raenette Rover MD   Signed by:   Raenette Rover MD on 11/28/2009   Method used:   [facility]        [id number]       [address], [place]  [address]       Ph: [phone] or [id number]       Fax: [phone]   RxID:   [id number] SIMVASTATIN 80 MG TABS (SIMVASTATIN) 1 by mouth at bedtime  #90 Tablet x 3   Entered and Authorized by:   Raenette Rover MD   Signed by:   Raenette Rover MD on 11/28/2009   Method used:   [id number]       [address], [place]  [address]       Ph: [phone] or [id number]       Fax: [phone]   [id number]   Current Allergies (reviewed today): ! ALTACE ! DIOVAN

## 2010-08-28 NOTE — Procedures (Signed)
Summary: Colonoscopy  Patient: Joshua Roy Note: All result statuses are Final unless otherwise noted.  Tests: (1) Colonoscopy (COL)   COL Colonoscopy           [facility].     [place], [place]  [address]           COLONOSCOPY PROCEDURE REPORT           PATIENT:  Joshua Roy, Joshua Roy  MR#:  [id number]     BIRTHDATE:  07/20/1951, 58 yrs. old  GENDER:  male     ENDOSCOPIST:  Milus Banister, MD     REF. BY:  Elsie Stain, M.D.     PROCEDURE DATE:  06/03/2010     PROCEDURE:  Colonoscopy with snare polypectomy     ASA CLASS:  Class II     INDICATIONS:  Routine Risk Screening     MEDICATIONS:   Fentanyl 75 mcg IV, Versed 5 mg IV           DESCRIPTION OF PROCEDURE:   After the risks benefits and     alternatives of the procedure were thoroughly explained, informed     consent was obtained.  Digital rectal exam was performed and     revealed no rectal masses.   The LB [id number] endoscope     was introduced through the anus and advanced to the cecum, which     was identified by both the appendix and ileocecal valve, without     limitations.  The quality of the prep was good, using MoviPrep.     The instrument was then slowly withdrawn as the colon was fully     examined.     <<PROCEDUREIMAGES>>     FINDINGS:  Five small, sessile polyps were found. All were removed     and all were sent to pathology (jar 1). These were located in     ascending and descending segments. Ranged in size from 41mm to 27mm.     The largest two polyps (one in descending, one in ascending) were     removed with snare/cautery. The other 3 polyps were removed with     cold snare (see image3).  This was otherwise a normal examination     of the colon (see image1, image2, and image4).   Retroflexed views     in the rectum revealed no abnormalities.    The scope was then     withdrawn from the patient and the procedure completed.     COMPLICATIONS:  None          ENDOSCOPIC IMPRESSION:     1) Five subcentimeter polyps, all removed and all sent to     pathology     2) Otherwise normal examination           RECOMMENDATIONS:     1) If the polyp(s) removed today are proven to be adenomatous     (pre-cancerous) polyps, you will need a colonoscopy in 3-5 years.     Otherwise you should continue to follow colorectal cancer     screening guidelines for "routine risk" patients with a     colonoscopy in 10 years.     2) You will receive a letter within 1-2 weeks with the results     of your biopsy as well as final recommendations. Please call my     office if you have not received a letter after  3 weeks.           ______________________________     Milus Banister, MD           n.     eSIGNED:   Milus Banister at 06/03/2010 08:56 AM           Prescilla Sours, [id number]  Note: An exclamation mark (!) indicates a result that was not dispersed into the flowsheet. Document Creation Date: 06/03/2010 8:56 AM _______________________________________________________________________  (1) Order result status: Final Collection or observation date-time: 06/03/2010 08:52 Requested date-time:  Receipt date-time:  Reported date-time:  Referring Physician:   Ordering Physician: Owens Loffler [phone]) Specimen Source:  Source: Tawanna Cooler Order Number: [id number] Lab site:   Appended Document: Colonoscopy     Procedures Next Due Date:    Colonoscopy: 05/2013

## 2010-08-28 NOTE — Progress Notes (Signed)
Summary: refill request for xanax  Phone Note Refill Request Message from:  Fax from Pharmacy  Refills Requested: Medication #1:  XANAX 0.5 MG TABS Take 1 tablet by mouth every six hours as needed   Last Refilled: 03/25/2010 Faxed request from [facility], [phone]   Initial call taken by: Marty Heck CMA, AAMA,  May 29, 2010 10:21 AM  Follow-up for Phone Call        please call in.  Follow-up by: Elsie Stain MD,  May 29, 2010 1:15 PM  Additional Follow-up for Phone Call Additional follow up Details #1::        Rx called to pharmacy Additional Follow-up by: Sherrian Divers CMA Deborra Medina),  May 29, 2010 3:02 PM    Prescriptions: XANAX 0.5 MG TABS (ALPRAZOLAM) Take 1 tablet by mouth every six hours as needed  #50 x 0   Entered and Authorized by:   Elsie Stain MD   Signed by:   Elsie Stain MD on 05/29/2010   Method used:   Telephoned to ...       [id number] * (retail)       [address], [place]  [address]       Ph: [phone] or [id number]       Fax: [phone]   RxID:   [id number]

## 2010-08-28 NOTE — Assessment & Plan Note (Signed)
Summary: 2:30 COLD,COUGH/CLE   Vital Signs:  Patient profile:   60 year old male Height:      69 inches Weight:      250.25 pounds BMI:     37.09 Temp:     97.6 degrees F oral Pulse rate:   76 / minute Pulse rhythm:   regular BP sitting:   142 / 82  (left arm) Cuff size:   large  Vitals Entered By: Ozzie Hoyle LPN (June  1, 624THL [id number] PM) CC: dry cough can't rest at night for coughing and stuffy head   History of Present Illness: came down with a cold 1 week ago monday took some mucinex  got some better and then worse  now dry cough is severe -- in fact has a sore rib as well   has had bronchitis and pneumonia in past  is a smoker- heavy has some copd   does want to quit  tried the electric cigarette knows he will worsen / copd until he does  is not sure he is ready  really stuffy head - hard to breathe   no fever  no wheeze or sob  last night was up all night -- spells of cough  has tried mucinex and alka selzer am and pm and nyquil -- just not helping   having trouble affording his cholesterol medicine     Allergies: 1)  ! Altace 2)  ! 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 hypokinesis EF 49% 100% ROA 11/01        ETT nml. Fair exercise for age 37/28/01   Echo nml. EF 55% 05/13/02   Exercise cardiolite (+) EF 46% 07/25/04   Left knee arthroscopy  Family History: Last updated: 11/28/2009 Father: Dec. 76 CAD, MI x 3, HTN Mother: Alive 85 Siblings: 7     l Sister DM, died 2023/05/10    1 Brother  A 3  Lymphoma, remission    1 Sister A 66  CABG x 3    4 others okay CV:  (+) father BP:  (+) father DM:  (+) sister CA:  (+) brother lymphoma ETOH:  (+) father  Social History: Last updated: 01/06/2007 Marital  Status: Married, lives with wife Children: 3  Occupation: Dental Tech/Dental Lab Current Smoker, 2 PPD since 42 Alcohol use-yes, 5-6 cocktails 2 x/week Drug use-no  Risk Factors: Alcohol Use: <1 (11/28/2009) Caffeine Use: 3 (11/28/2009) Exercise: no (11/28/2009)  Risk Factors: Smoking Status: current (11/28/2009) Packs/Day: 1.0+ (11/28/2009) Cans of tobacco/wk: no (11/28/2009) Passive Smoke Exposure: no (11/28/2009)  Review of Systems General:  Complains of fatigue; denies chills, fever, loss of appetite, and malaise. Eyes:  Denies blurring, discharge, and eye irritation. ENT:  Complains of hoarseness, nasal congestion, postnasal drainage, sinus pressure, and sore throat. CV:  Denies chest pain or discomfort, palpitations, shortness of breath with exertion, and swelling of feet. Resp:  Denies cough and wheezing. GI:  Denies abdominal pain, bloody stools, and change in bowel habits. MS:  Denies joint pain, joint redness, and joint swelling. Derm:  Denies lesion(s), poor wound healing, and rash.  Physical Exam  General:  overweight but generally well appearing  Head:  normocephalic, atraumatic, and no abnormalities observed.  Eyes:  vision grossly intact, pupils equal, pupils round, pupils reactive to light, and no injection.   Ears:  R ear normal and L ear normal.   Nose:  nares boggy and somewhat congested  Mouth:  pharynx pink and moist, no erythema, and no exudates.   Neck:  supple with full rom and no masses or thyromegally, no JVD or carotid bruit  Chest Wall:  No deformities, masses, tenderness or gynecomastia noted. Lungs:  diffusely distant bs rhonchi at both bases no rales harsh bs and scant wheeze on forced exp only Heart:  Normal rate and regular rhythm. S1 and S2 normal without gallop, murmur, click, rub or other extra sounds. Skin:  Intact without suspicious lesions or rashes Cervical Nodes:  No lymphadenopathy noted Psych:  normal affect, talkative and  pleasant    Impression & Recommendations:  Problem # 1:  BRONCHITIS- ACUTE (ICD-466.0) Assessment New  in smoker s/p uri with malaise and bad cough will cover with zithromax and update  tessalon for cough as needed  pt advised to update me if symptoms worsen or do not improve -- esp if any wheeze or sob adv to quit smoking disc inhaler use as needed  His updated medication list for this problem includes:    Mucinex Maximum Strength 1200 Mg Xr12h-tab (Guaifenesin) ..... Otc as directed.    Zithromax Z-pak 250 Mg Tabs (Azithromycin) .Marland Kitchen... Take by mouth as directed    Tessalon 200 Mg Caps (Benzonatate) .Marland Kitchen... 1 by mouth three times a day as needed cough  swallow whole  Orders: Prescription Created Electronically [id number])  Problem # 2:  DISORDER, TOBACCO USE (ICD-305.1)  discussed in detail risks of smoking, and possible outcomes including COPD, vascular dz, cancer and also respiratory infections/sinus problems   pt aware he likely has copd  has tried electric cigarette- does not work for him disc best time to quit is when ill  he agress and will think about it   Orders: Prescription Created Electronically [id number])  Complete Medication List: 1)  Viagra 100 Mg Tabs (Sildenafil citrate) .... 1/2 tablet 1 hour prior as needed 2)  Simvastatin 80 Mg Tabs (Simvastatin) .Marland Kitchen.. 1 by mouth at bedtime 3)  Plavix 75 Mg Tabs (Clopidogrel bisulfate) .Marland Kitchen.. 1 by mouth once daily 4)  Xanax 0.5 Mg Tabs (Alprazolam) .... Take 1 tablet by mouth every six hours as needed 5)  Aspir-low 81 Mg Tbec (Aspirin) .Marland Kitchen.. 1 by mouth daily 6)  Multivitamins Tabs (Multiple vitamin) .Marland Kitchen.. 1 by mouth daily 7)  Fish Oil 1000 Mg Caps (Omega-3 fatty acids) .Marland Kitchen.. 1 by mouth two times a day 8)  Glucophage 1000 Mg Tabs (Metformin hcl) .... One tab by mouth two times a day 9)  Accu-chek Active Strp (Glucose blood) .... Check daily 10)  Fenofibrate 160 Mg Tabs (Fenofibrate) .... One tab by mouth daily 11)  Osteo Bi-flex Regular  Strength 250-200 Mg Tabs (Glucosamine-chondroitin) .... Take two tabs by mouth daily 12)  Mucinex Maximum Strength 1200 Mg Xr12h-tab (Guaifenesin) .... Otc as directed. 13)  Zithromax Z-pak 250 Mg Tabs (Azithromycin) .... Take by mouth as directed 14)  Tessalon 200 Mg Caps (Benzonatate) .Marland Kitchen.. 1 by mouth three times a day as needed cough  swallow whole  Patient Instructions: 1)  you can try mucinex over the counter twice daily as directed and nasal saline spray for congestion 2)  tylenol over the counter as directed may help with aches, headache and fever 3)  call if symptoms worsen or if not  improved in 4-5 days  4)  try the tessalon for cough  5)  take the zithromax as directed  Prescriptions: TESSALON 200 MG CAPS (BENZONATATE) 1 by mouth three times a day as needed cough  swallow whole  #30 x 0   Entered and Authorized by:   Allena Earing MD   Signed by:   Allena Earing MD on 12/26/2009   Method used:   [facility]ron[facility] to        [id number] ([facility]       [address], [place]  [address]       Ph: [phone] or [id number]       Fax: [phone]   RxID:   [id number] ZITHROMAX Z-PAK 250 MG TABS (AZITHROMYCIN) take by mouth as directed  #1 pack x 0   Entered and Authorized by:   Allena Earing MD   Signed by:   Allena Earing MD on 12/26/2009   Method used:   [facility]        [id number] ([facility]       [address], [place]  [address]       Ph: [phone] or [id number]       Fax: [phone]   RxID:   [id number]   Current Allergies (reviewed today): ! ALTACE ! DIOVAN

## 2010-08-28 NOTE — Assessment & Plan Note (Signed)
Summary: ROA W/ [name]..PER DR [facility] CYD   Vital Signs:  Patient profile:   60 year old male Height:      69 inches Weight:      247 pounds BMI:     36.61 Temp:     97.6 degrees F oral Pulse rate:   80 / minute Pulse rhythm:   regular BP sitting:   142 / 90  (left arm) Cuff size:   large  Vitals Entered By: Christena Deem CMA Deborra Medina) (March 05, 2010 11:51 AM) CC: Transfer from [facility]   History of Present Illness: Diabetes: improved from prev A1c, working on low carb diet Using medications without difficulties: yes Hypoglycemic episodes: rare Hyperglycemic episodes: no Feet problems:no Blood Sugars averaging: 130s in AM eye exam within last year: follow up pending per patient.  Intolerant of ACE/ARB. D/w patient [id number]  Never had colonoscopy.  Asking about that.  He is also asking about second opinion from cards.  Allergies: 1)  ! Altace 2)  ! Diovan  Past History:  Family History: Last updated: 11/28/2009 Father: Dec. 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 Father: Dec. 76 CAD, MI x 3, HTN Mother: Alive 95 Siblings: 7     l Sister DM, died 05/06/2023    1 Brother  A 38  Lymphoma, remission    1 Sister A 66  CABG x 3    4 others okay CV:  (+) father BP:  (+) father DM:  (+) sister CA:  (+) brother  lymphoma ETOH:  (+) father  Social History: Reviewed history from 01/06/2007 and no changes required. 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  Otherwise negative.    Physical Exam  General:  GEN: nad, alert and oriented HEENT: mucous membranes moist NECK: supple w/o LA CV: rrr.  no murmur PULM: ctab, no inc wob ABD: soft, +bs EXT: no edema SKIN: no acute rash   Diabetes Management Exam:    Foot Exam (with socks and/or shoes not present):       Sensory-Pinprick/Light touch:          Left medial foot (L-4): normal          Left dorsal foot (L-5): normal          Left lateral foot (S-1): normal          Right medial foot (L-4): normal          Right dorsal foot (L-5): normal          Right  lateral foot (S-1): normal       Sensory-Monofilament:          Left foot: normal          Right foot: normal       Inspection:          Left foot: normal          Right foot: normal       Nails:          Left foot: normal          Right foot: normal   Impression & Recommendations:  Problem # 1:  DIABETES MELLITUS, TYPE II (ICD-250.00) No change in meds today.  patient is working on diet and exercise along with weight.  He has made some improvement.  Will recheck in 3 months.  Return for A1c.  intolerant of ACE/ARB.  Pt to monitor BP and report back.  It has usually been lower.  >25 min spent with patient, at least half of which was spent on counseling re:DM2.  His updated medication list for this problem includes:    Aspir-low 81 Mg Tbec (Aspirin) .Marland Kitchen... 1 by mouth daily    Glucophage 1000 Mg Tabs (Metformin hcl) ..... One tab by mouth two times a day  Problem # 2:  SPECIAL SCREENING MALIG NEOPLASMS OTHER SITES (ICD-V76.49)  refer to GI.  App GI help.    Orders: Gastroenterology Referral (GI)  Complete Medication List: 1)  Viagra 100 Mg Tabs (Sildenafil citrate) .... 1/2 tablet 1 hour prior as needed 2)  Simvastatin 80 Mg Tabs (Simvastatin) .Marland Kitchen.. 1 by mouth at bedtime 3)  Plavix 75 Mg Tabs (Clopidogrel bisulfate) .Marland Kitchen.. 1 by mouth once daily 4)  Xanax 0.5 Mg Tabs (Alprazolam) .... Take 1 tablet by mouth every six hours as needed 5)  Aspir-low 81 Mg Tbec (Aspirin) .Marland Kitchen.. 1 by mouth daily 6)  Multivitamins Tabs (Multiple vitamin) .Marland Kitchen.. 1 by mouth daily 7)  Fish Oil 1000 Mg Caps (Omega-3 fatty acids) .Marland Kitchen.. 1 by mouth two times a day 8)  Glucophage 1000 Mg Tabs (Metformin hcl) .... One tab by mouth two times a day 9)  Accu-chek Active Strp (Glucose blood) .... Check daily 10)  Fenofibrate 160 Mg Tabs (Fenofibrate) .... One tab by mouth daily 11)  Osteo Bi-flex Regular Strength 250-200 Mg Tabs (Glucosamine-chondroitin) .... Take two tabs by mouth daily  Patient Instructions: 1)  Call me back if you would like a referral to cardiology.  2)  See Rosaria Ferries about your referral before your leave today.   3)  Return in 3 months.  Get labs drawn a few days before the visit.  You don't have to come in fasting.  4)  Hgb A1c 250.00  Current Allergies (reviewed today): ! ALTACE ! DIOVAN

## 2010-08-28 NOTE — Progress Notes (Signed)
Summary: refill request for xanax  Phone Note Refill Request Message from:  Fax from Pharmacy  Refills Requested: Medication #1:  XANAX 0.5 MG TABS Take 1 tablet by mouth every six hours as needed   Last Refilled: 09/06/2009 Faxed request from [facility], phone [phone].  Initial call taken by: Marty Heck CMA,  September 17, 2009 10:41 AM  Follow-up for Phone Call        Even if pt taking every 6 hrs, which he typically does not do, he would not be due a refill yet. If he needs a refill now (assuming our chart is correct and he just gotr 50 11 days ago, he'll need tobe seen to discuss increased dose. Follow-up by: Raenette Rover MD,  September 17, 2009 1:23 PM  Additional Follow-up for Phone Call Additional follow up Details #1::        Advised pharmacist. Additional Follow-up by: Marty Heck CMA,  September 17, 2009 2:21 PM

## 2010-08-28 NOTE — Assessment & Plan Note (Signed)
History of Present Illness Visit Type: consult  Primary GI MD: Owens Loffler MD Primary Provider: Tonia Ghent, MD Requesting Provider: Tonia Ghent, MD Chief Complaint: Consult colon  History of Present Illness:     very pleasant 60 year old manWho is here to discuss screening colonoscopy.  A good friend of his was just diagnosed with colon cancer, s/p surgery and now chemo.    He has not had a colonoscpoy.  No contitpation, no diarrhea.  Does have minor rectal bleedint occaisionally for many years.  he has been on plavix, due to AMI at age 33.  Does not have a stent placed.  he has held the Plavix without problem for orthopedic procedures in the past.           Current Medications (verified): 1)  Viagra 100 Mg Tabs (Sildenafil Citrate) .... 1/2 Tablet 1 Hour Prior As Needed 2)  Simvastatin 80 Mg Tabs (Simvastatin) .Marland Kitchen.. 1 By Mouth At Bedtime 3)  Plavix 75 Mg Tabs (Clopidogrel Bisulfate) .Marland Kitchen.. 1 By Mouth Once Daily 4)  Xanax 0.5 Mg Tabs (Alprazolam) .... Take 1 Tablet By Mouth Every Six Hours As Needed 5)  Aspir-Low 81 Mg Tbec (Aspirin) .Marland Kitchen.. 1 By Mouth Daily 6)  Multivitamins  Tabs (Multiple Vitamin) .Marland Kitchen.. 1 By Mouth Daily 7)  Fish Oil 1000 Mg Caps (Omega-3 Fatty Acids) .Marland Kitchen.. 1 By Mouth Two Times A Day 8)  Glucophage 1000 Mg  Tabs (Metformin Hcl) .... One Tab By Mouth Two Times A Day 9)  Accu-Chek Active   Strp (Glucose Blood) .... Check Daily 10)  Fenofibrate 160 Mg Tabs (Fenofibrate) .... One Tab By Mouth Daily 11)  Osteo Bi-Flex Regular Strength 250-200 Mg Tabs (Glucosamine-Chondroitin) .... Take Two Tabs By Mouth Daily  Allergies (verified): 1)  ! Altace 2)  ! 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 hypokinesis EF 49% 100% ROA 11/01        ETT nml. Fair  exercise for age 32/28/01   Echo nml. EF 55% 05/13/02   Exercise cardiolite (+) EF 46% 07/25/04   Left knee arthroscopy     Family History: Father: Dec. 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  All other review of systems was otherwise negative.   Vital Signs:  Patient profile:   60 year old male Height:      69 inches Weight:      247 pounds BMI:  36.61 BSA:     2.26 Pulse rate:   76 / minute Pulse rhythm:   regular BP sitting:   146 / 84  (left arm) Cuff size:   regular  Vitals Entered By: Hope Pigeon CMA (April 17, 2010 8:46 AM)  Physical Exam  Additional Exam:  Constitutional: generally well appearing Psychiatric: alert and oriented times 3 Eyes: extraocular movements intact Mouth: oropharynx moist, no lesions Neck: supple, no lymphadenopathy Cardiovascular: heart regular rate and rythm Lungs: CTA bilaterally Abdomen: soft, non-tender, non-distended, no obvious ascites, no peritoneal signs, normal bowel sounds Extremities: no lower extremity edema bilaterally Skin: no lesions on visible extremities    Impression & Recommendations:  Problem # 1:  routine risk for colon cancer he is on Plavix for coronary artery disease. He had a non-Q-wave MI 10 years ago and has been taking Plavix ever since. It is not. He had a stent  placed. He wonders if he still needs to be on Plavix I told him I don't have the expertise really to answer that question. He does understand that staying on Plavix we'll put him at increased risk for bleeding complications during a colonoscopy and so we will get in touch with his primary care physician about him holding the medicine for 5 days prior to such a procedure. He may be interested in getting a new cardiologist, he wants to think about that.  Problem # 2:  smoker I gave him information on smoking cessation.  Patient Instructions: 1)  You will be scheduled to have a colonoscopy.  2)  We will get in touch with Dr. Damita Dunnings about holding your plavix for 5 days prior to colonoscopy. 3)  One of your biggest health concerns is your smoking.  You should try your absolute best to stop.  If you need assistence, please contact your PCP or Smoking Cessation Class at [facility] [phone]) or [place] (1-800-QUIT-NOW). 4)  The medication list was reviewed and reconciled.  All changed / newly prescribed medications were explained.  A complete medication list was provided to the patient / caregiver.  Appended Document: Orders Update    Clinical Lists Changes  Problems: Added new problem of SPECIAL SCREENING FOR MALIGNANT NEOPLASMS COLON (ICD-V76.51) Medications: Added new medication of MOVIPREP 100 GM  SOLR (PEG-KCL-NACL-NASULF-NA ASC-C) As per prep instructions. - Signed Rx of MOVIPREP 100 GM  SOLR (PEG-KCL-NACL-NASULF-NA ASC-C) As per prep instructions.;  #1 x 0;  Signed;  Entered by: Abelino Derrick CMA (AAMA);  Authorized by: Milus Banister MD;  Method used: [id number] [address], [place], [place]  [address], Ph: [phone] or [phone], Fax: [phone] Orders: Added new Test order of Colonoscopy (Colon) - Signed    Prescriptions: MOVIPREP 100 GM  SOLR (PEG-KCL-NACL-NASULF-NA ASC-C) As per prep instructions.  #1 x 0    Entered by:   Abelino Derrick CMA ([place])   Authorized by:   Milus Banister MD   Signed by:   Abelino Derrick CMA ([place]) on 04/17/2010   Method used:   [facility]        [id number]ail[facility]       [address], [place]  [address]       Ph: [phone] or [phone]       Fax: [phone]   RxID:   [id number]

## 2010-08-28 NOTE — Progress Notes (Signed)
Summary: refill request for xanax  Phone Note Refill Request Message from:  Fax from Pharmacy  Refills Requested: Medication #1:  XANAX 0.5 MG TABS Take 1 tablet by mouth every six hours as needed   Last Refilled: 07/25/2009 Faxed request from [facility], phone [phone].  Initial call taken by: Marty Heck CMA,  September 06, 2009 11:19 AM  Follow-up for Phone Call        [facility] Follow-up by: Marty Heck CMA,  September 06, 2009 12:44 PM    Prescriptions: Duanne Moron 0.5 MG TABS (ALPRAZOLAM) Take 1 tablet by mouth every six hours as needed  #50 x 0   Entered and Authorized by:   Raenette Rover MD   Signed by:   Raenette Rover MD on 09/06/2009   Method used:   Telephoned to ...       [id number] (retail[facility]       [address] [place] [place], [place]  [address]       Ph: [phone] or [phone]       Fax: [phone]   RxID:   [id number]

## 2010-08-28 NOTE — Progress Notes (Signed)
Summary: Rx Alprazolam  Phone Note Refill Request Call back at [phone] Message [facility]:  [facility] on October 12, 2009 9:42 AM  Refills Requested: Medication #1:  XANAX 0.5 MG TABS Take 1 tablet by mouth every six hours as needed   Last Refilled: 09/06/2009 Received faxed refill request, please advise.  Dr. Council Mechanic on vacation all next week.     Method Requested: Telephone to Pharmacy Initial call taken by: Sherrian Divers CMA Deborra Medina),  October 12, 2009 9:42 AM  Follow-up for Phone Call        Medication phoned to pharmacy.  Follow-up by: Christena Deem CMA Deborra Medina),  October 12, 2009 11:16 AM    Prescriptions: XANAX 0.5 MG TABS (ALPRAZOLAM) Take 1 tablet by mouth every six hours as needed  #50 x 0   Entered and Authorized by:   Arnette Norris MD   Signed by:   Arnette Norris MD on 10/12/2009   Method used:   Handwritten   [id number]

## 2010-08-28 NOTE — Progress Notes (Signed)
Summary: Rx Viagra  Phone Note Refill Request Call back at [phone] Message from:  [place]. on January 29, 2010 8:32 AM  Refills Requested: Medication #1:  VIAGRA 100 MG TABS 1/2 tablet 1 hour prior as needed  Method Requested: Electronic Initial call taken by: Emelia Salisbury LPN,  July  5, 624THL 075-GRM AM  Follow-up for Phone Call        ? are Dr Melanie Crazier pt calls now supposed to go to [name]? - please let me know  Follow-up by: Allena Earing MD,  January 29, 2010 8:36 AM  Additional Follow-up for Phone Call Additional follow up Details #1::        Rx sent and will see patient as scheduled.  Additional Follow-up by: Elsie Stain MD,  January 29, 2010 2:48 PM    Prescriptions: VIAGRA 100 MG TABS (SILDENAFIL CITRATE) 1/2 tablet 1 hour prior as needed  #9 x 12   Entered and Authorized by:   Elsie Stain MD   Signed by:   Elsie Stain MD on 01/29/2010   Method used:   [facility]        [id number]ail[facility]       [address] [place], [place]  [address]       Ph: [phone] or [phone]       Fax: [phone]   RxID:   [id number]

## 2010-08-28 NOTE — Letter (Signed)
Summary: [facility] Instructions  [facility]  [place], [place] [address]   Phone: [phone]  Fax: [phone]       BERIC PANAGAKOS    06-07-1951    MRN: [id number]      Procedure Day Sudie Grumbling: Adelfa Koh, 06/03/10     Arrival Time: 7:30 AM      Procedure Time: 8:30 AM    Location of Procedure:                    _X_  [facility] (4th Floor)  [place]   Starting 5 days prior to your procedure 05/29/10 do not eat nuts, seeds, popcorn, corn, beans, peas,  salads, or any raw vegetables.  Do not take any fiber supplements (e.g. Metamucil, Citrucel, and Benefiber).  THE DAY BEFORE YOUR PROCEDURE         SUNDAY, 06/03/10  1.  Drink clear liquids the entire day-NO SOLID FOOD  2.  Do not drink anything colored red or purple.  Avoid juices with pulp.  No orange juice.  3.  Drink at least 64 oz. (8 glasses) of fluid/clear liquids during the day to prevent dehydration and help the prep work efficiently.  CLEAR LIQUIDS INCLUDE: Water Jello Ice Popsicles Tea (sugar ok, no milk/cream) Powdered fruit flavored drinks Coffee (sugar ok, no milk/cream) Gatorade Juice: apple, white grape, white cranberry  Lemonade Clear bullion, consomm, broth Carbonated beverages (any kind) Strained chicken noodle soup Hard Candy                           4.  In the morning, mix first dose of MoviPrep solution:    Empty 1 Pouch A and 1 Pouch B into the disposable container    Add lukewarm drinking water to the top line of the container. Mix to dissolve    Refrigerate (mixed solution should be used within 24 hrs)  5.  Begin drinking the prep at 5:00 p.m. The MoviPrep container is divided by 4 marks.   Every 15 minutes drink the solution down to the next mark (approximately 8 oz) until the full liter is complete.   6.  Follow completed prep with 16 oz of clear liquid of your choice (Nothing red or purple).  Continue to drink clear  liquids until bedtime.  7.  Before going to bed, mix second dose of MoviPrep solution:    Empty 1 Pouch A and 1 Pouch B into the disposable container    Add lukewarm drinking water to the top line of the container. Mix to dissolve    Refrigerate  THE DAY OF YOUR PROCEDURE      MONDAY, 06/03/10  Beginning at 3:30 AM (5 hours before procedure):         1. Every 15 minutes, drink the solution down to the next mark (approx 8 oz) until the full liter is complete.  2. Follow completed prep with 16 oz. of clear liquid of your choice.    3. You may drink clear liquids until 6:30 AM (2 HOURS BEFORE PROCEDURE).  MEDICATION INSTRUCTIONS  Unless otherwise instructed, you should take regular prescription medications with a small sip of water   as early as possible the morning of your procedure.  Stop taking Plavix or Aggrenox on  _  _  (7 days before procedure).  You will be contacted by our office prior to your procedure for directions on  holding your Plavix.  If you do not hear from our office 1 week prior to your scheduled procedure, please call [phone] to discuss.    Additional medication instructions: No Viagra 48 hours prior to procedure.        OTHER INSTRUCTIONS  You will need a responsible adult at least 62 years of age to accompany you and drive you home.   This person must remain in the waiting room during your procedure.  Wear loose fitting clothing that is easily removed.  Leave jewelry and other valuables at home.  However, you may wish to bring a book to read or  an iPod/MP3 player to listen to music as you wait for your procedure to start.  Remove all body piercing jewelry and leave at home.  Total time from sign-in until discharge is approximately 2-3 hours.  You should go home directly after your procedure and rest.  You can resume normal activities the  day after your procedure.              The day of your procedure you should not:   Drive    Make legal decisions   Operate machinery   Drink alcohol   Return to work  You will receive specific instructions about eating, activities and medications before you leave.    The above instructions have been reviewed and explained to me by   _______________________    I fully understand and can verbalize these instructions _____________________________ Date _________

## 2010-08-28 NOTE — Progress Notes (Signed)
Summary: Rx Tricor  Phone Note Refill Request Call back at [phone] Message [id number] on March 04, 2010 9:40 AM  Received a refill request from pharmcy for Tricor 145mg , take one by mouth daily. Medications is not on med sheet.    Method Requested: Electronic Initial call taken by: Emelia Salisbury LPN,  August  8, 624THL 9:42 AM  Follow-up for Phone Call        Deny this.  The patient prev had rx for 160mg  strength sent in.  please verify with pharmacy.  see note from cpe 5/11.  Follow-up by: Elsie Stain MD,  March 04, 2010 1:55 PM  Additional Follow-up for Phone Call Additional follow up Details #1::        Denied. Additional Follow-up by: Christena Deem CMA ([place]),  March 04, 2010 2:26 PM

## 2010-08-28 NOTE — Miscellaneous (Signed)
Summary: Controlled Substance Agreement  Controlled Substance Agreement   Imported By: Edmonia James 01/02/2010 09:01:54  _____________________________________________________________________  External Attachment:    Type:   Image     Comment:   External Document

## 2010-08-28 NOTE — Letter (Signed)
Summary: Results Letter  [facility]  [place], [place] [address]   Phone: [phone]  Fax: [phone]        June 05, 2010 MRN: [id number]    [facility] [place],   [address]    Dear Mr. 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  These are pre-cancerous polyps that may have grown into cancers if they had not been removed.  Based on current nationally recognized surveillance guidelines, I recommend that you have a repeat colonoscopy in 3 years.   We will therefore put your information in our reminder system and will contact you in 3 years to schedule a repeat procedure.  Please call if you have any questions or concerns.       Sincerely,  Milus Banister MD  This letter has been electronically signed by your physician.  Appended Document: Results Letter letter mailed

## 2010-08-29 NOTE — Progress Notes (Signed)
Summary: wants rx for tessalon   Phone Note Call from Patient Call back at Home Phone [phone] Call back at [phone]   Caller: Patient Call For: Elsie Stain MD Summary of Call: Patient sasy that he has a really nagging cough that is expecially bad at night, making it hard for him to sleep. I offered patient appt, but he says with his work schedule and him being 30 min away it is very difficult for him to come in. He is asking if he can get a rx for tessalon perles. He says that he has used this in the past and it really worked well for him. [facility]. Please advise.  Initial call taken by: Lacretia Nicks,  August 01, 2010 10:43 AM  Follow-up for Phone Call        tessalon sent in.  please have patient follow up if the cough doesn't improve.  thanks.  Follow-up by: Elsie Stain MD,  August 01, 2010 1:44 PM  Additional Follow-up for Phone Call Additional follow up Details #1::        Patient Advised.  Additional Follow-up by: Christena Deem CMA ([place]),  August 01, 2010 2:53 PM    New/Updated Medications: TESSALON 200 MG CAPS (BENZONATATE) 1 by mouth three times a day as needed for cough Prescriptions: TESSALON 200 MG CAPS (BENZONATATE) 1 by mouth three times a day as needed for cough  #30 x 1   Entered and Authorized by:   Elsie Stain MD   Signed by:   Elsie Stain MD on 08/01/2010   Method used:   [facility]        [id number]       [address], [place]  [address]       Ph: [phone] or [phone]       Fax: [phone]   RxID:   [id number]

## 2010-08-29 NOTE — Progress Notes (Signed)
Summary: xanax   Phone Note Refill Request Message from:  Fax from Pharmacy on August 09, 2010 2:30 PM  Refills Requested: Medication #1:  XANAX 0.5 MG TABS Take 1 tablet by mouth every six hours as needed   Last Refilled: 05/29/2010 [facility] [address] [id number]  Initial call taken by: Lacretia Nicks,  August 09, 2010 2:33 PM  Follow-up for Phone Call        please call in.  He's due for A1c.  see OV note from 03/05/10.  please get him 27min OV a few days after the labs are done.  Follow-up by: Elsie Stain MD,  August 09, 2010 2:49 PM  Additional Follow-up for Phone Call Additional follow up Details #1::        Medication phoned to pharmacy.  Patient Advised. Lab appointment scheduled  11/12/2010.  Appointment scheduled  11/14/2010 Additional Follow-up by: Christena Deem CMA ([place]),  August 09, 2010 3:15 PM    Prescriptions: XANAX 0.5 MG TABS (ALPRAZOLAM) Take 1 tablet by mouth every six hours as needed  #50 x 0   Entered and Authorized by:   Elsie Stain MD   Signed by:   Elsie Stain MD on 08/09/2010   Method used:   Telephoned to ...       [id number]       [address], [place]  [address]       Ph: [phone] or [phone]       Fax: [phone]   RxID:   [id number]

## 2010-09-17 ENCOUNTER — Ambulatory Visit (INDEPENDENT_AMBULATORY_CARE_PROVIDER_SITE_OTHER): Payer: Self-pay | Admitting: Family Medicine

## 2010-09-17 ENCOUNTER — Encounter: Payer: Self-pay | Admitting: Family Medicine

## 2010-09-17 DIAGNOSIS — J449 Chronic obstructive pulmonary disease, unspecified: Secondary | ICD-10-CM

## 2010-09-24 NOTE — Assessment & Plan Note (Signed)
Summary: ccough/rbh   Vital Signs:  Patient profile:   60 year old male Weight:      256.50 pounds O2 Sat:      96 % on Room air Temp:     98.3 degrees F Pulse rate:   64 / minute Pulse rhythm:   regular BP sitting:   160 / 98  (left arm) Cuff size:   large  Vitals Entered By: Emelia Salisbury LPN (February 21, [id number] 3:07 PM)  O2 Flow:  Room air CC: Productive cough/white X 1 week   History of Present Illness: Cough.  H/o COPD.  "I thought I had a cold and cough."  Had been using some tessalon and was getting better until last week.  Some URI symptoms with congestion but coughing fits are bothering the patient.  Some wheezing, worse at night.  Cutting back on smoking.  No fevers.  Some white sputum.  No ear pain, ST.  Occ HA from the coughing.     Allergies: 1)  ! Altace 2)  ! Diovan  Past History:  Past Medical History: Anxiety COPD Coronary artery disease Hyperlipidemia Hypertension ACE cough    Diabetes mellitus, type II  Social History: Reviewed history from 04/17/2010 and no changes required. 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  Otherwise negative.    Physical Exam  General:  no apparent distress normocephalic atraumatic tm wnl nasal exam with some clear rhinorrhea, moderate congestion OP w/o exudates neck supple regular rate and rhythm clear to auscultation bilaterally except for occ exp wheezes.  no focal decrease in bs. no increase in wob.    Impression & Recommendations:  Problem # 1:  COPD (ICD-496) I would hold the antibiotics for now since he is w/o fever, shortness of breath, and discoloration of sputum.  I would start symbicort, use ventolin as needed, and cut back on  smoking.  Rinse after symbicort use.  He used the sample symbicort here in the office w/o difficulty.  He can refill the tessalon.  He agrees/understands.  Nontoxic and okay for outpatient follow up.  His updated medication list for this problem includes:    Symbicort 160-4.5 Mcg/act Aero (Budesonide-formoterol fumarate) .Marland Kitchen... 1 puff two times a day, rinse after use    Ventolin Hfa 108 (90 Base) Mcg/act Aers (Albuterol sulfate) .Marland Kitchen... 2 puffs every 4 hours as needed for cough  Complete Medication List: 1)  Viagra 100 Mg Tabs (Sildenafil citrate) .... 1/2 tablet 1 hour prior as needed 2)  Simvastatin 80 Mg Tabs (Simvastatin) .Marland Kitchen.. 1 by mouth at bedtime 3)  Plavix 75 Mg Tabs (Clopidogrel bisulfate) .Marland Kitchen.. 1 by mouth once daily 4)  Xanax 0.5 Mg Tabs (Alprazolam) .... Take 1 tablet by mouth every six hours as needed 5)  Aspir-low 81 Mg Tbec (Aspirin) .Marland Kitchen.. 1 by mouth daily 6)  Multivitamins Tabs (Multiple vitamin) .Marland Kitchen.. 1 by mouth daily 7)  Fish Oil 1000 Mg Caps (Omega-3 fatty acids) .Marland Kitchen.. 1 by mouth two times a day 8)  Glucophage 1000 Mg Tabs (Metformin hcl) .... One tab by mouth two times a day 9)  Accu-chek Active Strp (Glucose blood) .... Check daily 10)  Fenofibrate 160 Mg Tabs (Fenofibrate) .Marland KitchenMarland KitchenMarland Kitchen  One tab by mouth daily 11)  Osteo Bi-flex Regular Strength 250-200 Mg Tabs (Glucosamine-chondroitin) .... Take two tabs by mouth daily 12)  Tessalon 200 Mg Caps (Benzonatate) .Marland Kitchen.. 1 by mouth three times a day as needed for cough 13)  Symbicort 160-4.5 Mcg/act Aero (Budesonide-formoterol fumarate) .Marland Kitchen.. 1 puff two times a day, rinse after use 14)  Ventolin Hfa 108 (90 Base) Mcg/act Aers (Albuterol sulfate) .... 2 puffs every 4 hours as needed for cough 15)  Zithromax 250 Mg Tabs (Azithromycin) .... 2 by mouth x1 day and then 1 by mouth once daily for 4days.  Patient Instructions: 1)  Get a fasting cmet/lipid/A1c/MALB in 10/2010. 250.00 2)  Come see me for a 5min OV a few days later.  3)  Check your pressure  a few times before you come to the visit and bring in the records.   4)  Use the symbicort 1 puff  two times a day  and rinse after use.  5)  Use the ventolin 2 puffs every 4 hours as needed. 6)  Start the antibiotics in 2-3 days if you aren't improving.   7)  Try to cut down on smoking.   Prescriptions: VENTOLIN HFA 108 (90 BASE) MCG/ACT AERS (ALBUTEROL SULFATE) 2 puffs every 4 hours as needed for cough  #1 x 2   Entered and Authorized by:   Elsie Stain MD   Signed by:   Elsie Stain MD on 09/17/2010   Method used:   [facility]        [id number]       [address], [place]  [address]       Ph: [phone] or [phone]       Fax: [phone]   RxID:   [id number] ZITHROMAX 250 MG TABS (AZITHROMYCIN) 2 by mouth x1 day and then 1 by mouth once daily for 4days.  #6 x 0   Entered and Authorized by:   Elsie Stain MD   Signed by:   Elsie Stain MD on 09/17/2010   Method used:   Print then Give to Patient   RxID:   [id number]    Orders Added: 1)  Est. Patient Level III [id number]    Current Allergies (reviewed today): ! ALTACE ! DIOVAN

## 2010-09-25 ENCOUNTER — Encounter: Payer: Self-pay | Admitting: Family Medicine

## 2010-09-26 ENCOUNTER — Encounter: Payer: Self-pay | Admitting: Family Medicine

## 2010-09-26 ENCOUNTER — Telehealth: Payer: Self-pay | Admitting: Family Medicine

## 2010-10-03 NOTE — Miscellaneous (Signed)
  Clinical Lists Changes  Medications: Added new medication of TRICOR 145 MG TABS (FENOFIBRATE) Take 1 tablet by mouth once a day

## 2010-10-03 NOTE — Progress Notes (Signed)
Summary: [place]  Phone Note Refill Request Message from:  Fax from Pharmacy on September 26, 2010 4:26 PM  Refills Requested: Medication #1:  TRICOR 145 MG TABS Take 1 tablet by mouth once a day.   Supp[facility] Requested[facility] [place], [place]  Phone:   [phone]   Fax:   [phone]    This was not on patient's med list, added for refill request.   Method Requested: Electronic Initial call taken by: Christena Deem CMA Deborra Medina),  September 26, 2010 4:27 PM  Follow-up for Phone Call        he was prev on fenofibrate 160mg .  this is okay to change.  rx sent in.  Follow-up by: Elsie Stain MD,  September 26, 2010 4:48 PM    Prescriptions: TRICOR 145 MG TABS (FENOFIBRATE) Take 1 tablet by mouth once a day  #90 x 3   Entered and Authorized by:   Elsie Stain MD   Signed by:   Elsie Stain MD on 09/26/2010   Method used:   [facility]        [facility]  [id number] [facility] ([facility]       [address], [place]  [id number]       Ph: [phone] or [phone]       Fax: [phone]   RxID:   [id number]

## 2010-10-08 LAB — GLUCOSE, CAPILLARY

## 2010-10-15 ENCOUNTER — Other Ambulatory Visit: Payer: Self-pay | Admitting: Family Medicine

## 2010-10-15 MED ORDER — ALPRAZOLAM 0.5 MG PO TABS: 0.5000 mg | ORAL_TABLET | Freq: Four times a day (QID) | ORAL | Status: DC | PRN

## 2010-10-16 NOTE — Telephone Encounter (Signed)
Dr. Damita Dunnings patient

## 2010-10-16 NOTE — Telephone Encounter (Signed)
Phoned medication in to pharmacy.

## 2010-11-08 ENCOUNTER — Other Ambulatory Visit: Payer: Self-pay | Admitting: Family Medicine

## 2010-11-08 DIAGNOSIS — E785 Hyperlipidemia, unspecified: Secondary | ICD-10-CM

## 2010-11-08 DIAGNOSIS — E119 Type 2 diabetes mellitus without complications: Secondary | ICD-10-CM

## 2010-11-08 DIAGNOSIS — I10 Essential (primary) hypertension: Secondary | ICD-10-CM

## 2010-11-12 ENCOUNTER — Other Ambulatory Visit (INDEPENDENT_AMBULATORY_CARE_PROVIDER_SITE_OTHER): Payer: BLUE CROSS/BLUE SHIELD | Admitting: Family Medicine

## 2010-11-12 DIAGNOSIS — E785 Hyperlipidemia, unspecified: Secondary | ICD-10-CM

## 2010-11-12 DIAGNOSIS — E119 Type 2 diabetes mellitus without complications: Secondary | ICD-10-CM

## 2010-11-12 DIAGNOSIS — I10 Essential (primary) hypertension: Secondary | ICD-10-CM

## 2010-11-12 LAB — MICROALBUMIN / CREATININE URINE RATIO
Creatinine,U: 73.9 mg/dL
Microalb Creat Ratio: 1.6 mg/g (ref 0.0–30.0)

## 2010-11-12 LAB — COMPREHENSIVE METABOLIC PANEL
ALT: 24 U/L (ref 0–53)
AST: 20 U/L (ref 0–37)
Alkaline Phosphatase: 36 U/L — ABNORMAL LOW (ref 39–117)
BUN: 17 mg/dL (ref 6–23)
Creatinine, Ser: 1.2 mg/dL (ref 0.4–1.5)

## 2010-11-12 LAB — LIPID PANEL
Cholesterol: 164 mg/dL (ref 0–200)
HDL: 33.1 mg/dL — ABNORMAL LOW (ref >39.00)
Total CHOL/HDL Ratio: 5
VLDL: 49 mg/dL — ABNORMAL HIGH (ref 0.0–40.0)

## 2010-11-12 LAB — HEMOGLOBIN A1C: Hgb A1c MFr Bld: 8.4 % — ABNORMAL HIGH (ref 4.6–6.5)

## 2010-11-12 LAB — LDL CHOLESTEROL, DIRECT: Direct LDL: 104.8 mg/dL

## 2010-11-14 ENCOUNTER — Encounter: Payer: Self-pay | Admitting: Family Medicine

## 2010-11-14 ENCOUNTER — Ambulatory Visit (INDEPENDENT_AMBULATORY_CARE_PROVIDER_SITE_OTHER): Payer: BLUE CROSS/BLUE SHIELD | Admitting: Family Medicine

## 2010-11-14 VITALS — BP 146/86 | HR 84 | Temp 98.3°F | Wt 250.0 lb

## 2010-11-14 DIAGNOSIS — E119 Type 2 diabetes mellitus without complications: Secondary | ICD-10-CM

## 2010-11-14 NOTE — Progress Notes (Signed)
I was behind and patient needed to leave.  I called and apologized to him.  I was behind due to other clinical duties.  He understood.  He said he would rechedule.  He didn't get his copay back for today, so we'll credit him on the next visit.

## 2010-11-14 NOTE — Assessment & Plan Note (Signed)
Will reschedule

## 2010-11-18 ENCOUNTER — Encounter: Payer: Self-pay | Admitting: Family Medicine

## 2010-11-18 ENCOUNTER — Ambulatory Visit (INDEPENDENT_AMBULATORY_CARE_PROVIDER_SITE_OTHER): Payer: BLUE CROSS/BLUE SHIELD | Admitting: Family Medicine

## 2010-11-18 VITALS — BP 146/86 | HR 84 | Temp 98.3°F | Wt 250.6 lb

## 2010-11-18 DIAGNOSIS — F17200 Nicotine dependence, unspecified, uncomplicated: Secondary | ICD-10-CM

## 2010-11-18 DIAGNOSIS — E119 Type 2 diabetes mellitus without complications: Secondary | ICD-10-CM

## 2010-11-18 DIAGNOSIS — M25569 Pain in unspecified knee: Secondary | ICD-10-CM | POA: Insufficient documentation

## 2010-11-18 DIAGNOSIS — I2510 Atherosclerotic heart disease of native coronary artery without angina pectoris: Secondary | ICD-10-CM

## 2010-11-18 DIAGNOSIS — I10 Essential (primary) hypertension: Secondary | ICD-10-CM

## 2010-11-18 DIAGNOSIS — E785 Hyperlipidemia, unspecified: Secondary | ICD-10-CM

## 2010-11-18 MED ORDER — TRAMADOL HCL 50 MG PO TABS: 50.0000 mg | ORAL_TABLET | Freq: Four times a day (QID) | ORAL | Status: AC | PRN

## 2010-11-18 MED ORDER — SIMVASTATIN 80 MG PO TABS: 40.0000 mg | ORAL_TABLET | Freq: Every day | ORAL | Status: DC

## 2010-11-18 NOTE — Assessment & Plan Note (Signed)
Will need to work on weight.  Will follow in clinic. Cut simva to 40mg  a day.  D/w pt.

## 2010-11-18 NOTE — Assessment & Plan Note (Addendum)
Refer to ortho.  Tramadol for pain in meantime.

## 2010-11-18 NOTE — Assessment & Plan Note (Addendum)
Will need to work on weight.  D/w pt re:DM2 diet and handout given.  Will follow in clinic. Return for labs.  If A1c not improved, will need more medical intervention. He understood.

## 2010-11-18 NOTE — Progress Notes (Signed)
L knee pain.  H/o B knee surgery/OA.  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  Having to take steps slowly.  The knee feels weak.  Pain with each step.  Taking advil frequently.  See plan.   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  No CP, sob, ble edema.  Smoker.  He is asking about second opinion on cards and plavix.  He would like to be set up with [employer] [employer] cards.    Diabetes:  Using medications without difficulties:yes Hypoglycemic episodes:no Hyperglycemic episodes:no Feet problems:no Blood Sugars averaging: ~150-160 in AM Last eye exam: pt to call about this.   "I'm not doing good eating wise."  Hypertension:    Using medication without problems or lightheadedness: yes Chest pain with exertion:no Edema:no Short of breath:no Average home BPs:not checked.  Elevated Cholesterol: Using medications without problems:now Muscle aches: occ aches, mild Other complaints:see above.   PMH and SH reviewed.   Vital signs, Meds and allergies reviewed.  ROS: See HPI.  Otherwise nontributory.   GEN: nad, alert and oriented HEENT: mucous membranes moist NECK: supple w/o LA CV: rrr PULM: ctab, no inc wob ABD: soft, +bs EXT: no edema SKIN: no acute rash  Diabetic foot exam: Normal inspection No skin breakdown No calluses  Normal DP pulses Normal sensation to light tough and monofilament Nails normal

## 2010-11-18 NOTE — Assessment & Plan Note (Signed)
Will need to work on weight.  Will follow in clinic.

## 2010-11-18 NOTE — Assessment & Plan Note (Signed)
Encouraged to quit. 

## 2010-11-18 NOTE — Patient Instructions (Addendum)
See Rosaria Ferries about your referral before your leave today. Schedule a follow up appointment for labs in 3 months with a 34min OV a few days after that. Work on a low carb diet in the meantime to decrease your weight.  Try to cut bask on salt and fried foods to help your blood pressure.  Take care.

## 2010-11-27 ENCOUNTER — Ambulatory Visit: Payer: BLUE CROSS/BLUE SHIELD | Admitting: Cardiovascular Disease

## 2010-12-11 ENCOUNTER — Other Ambulatory Visit: Payer: Self-pay | Admitting: *Deleted

## 2010-12-11 MED ORDER — ALPRAZOLAM 0.5 MG PO TABS: 0.5000 mg | ORAL_TABLET | Freq: Four times a day (QID) | ORAL | Status: DC | PRN

## 2010-12-11 NOTE — Telephone Encounter (Signed)
Ok to phone in.  Notify pt.

## 2010-12-11 NOTE — Telephone Encounter (Signed)
Rx called in as directed.   

## 2010-12-20 ENCOUNTER — Other Ambulatory Visit: Payer: Self-pay | Admitting: *Deleted

## 2010-12-20 MED ORDER — SIMVASTATIN 80 MG PO TABS: 40.0000 mg | ORAL_TABLET | Freq: Every day | ORAL | Status: DC

## 2010-12-30 ENCOUNTER — Other Ambulatory Visit: Payer: Self-pay | Admitting: *Deleted

## 2010-12-30 MED ORDER — CLOPIDOGREL BISULFATE 75 MG PO TABS: 75.0000 mg | ORAL_TABLET | Freq: Every day | ORAL | Status: DC

## 2010-12-30 NOTE — Telephone Encounter (Signed)
Pt states plavix is now available in generic and he would like to change to that, due to cost,  [facility].

## 2010-12-30 NOTE — Telephone Encounter (Signed)
Done

## 2011-01-02 ENCOUNTER — Other Ambulatory Visit: Payer: Self-pay | Admitting: Family Medicine

## 2011-01-02 MED ORDER — FENOFIBRATE 160 MG PO TABS: 160.0000 mg | ORAL_TABLET | Freq: Every day | ORAL | Status: DC

## 2011-02-06 ENCOUNTER — Other Ambulatory Visit: Payer: Self-pay | Admitting: Family Medicine

## 2011-02-06 MED ORDER — SILDENAFIL CITRATE 100 MG PO TABS: ORAL_TABLET | ORAL | Status: DC

## 2011-02-06 MED ORDER — ALPRAZOLAM 0.5 MG PO TABS: 0.5000 mg | ORAL_TABLET | Freq: Four times a day (QID) | ORAL | Status: DC | PRN

## 2011-02-06 NOTE — Telephone Encounter (Signed)
Okay to refill? He has an appt scheduled in 2 weeks.

## 2011-02-06 NOTE — Telephone Encounter (Signed)
Meds called in as directed. Simvastatin was 40mg  daily.

## 2011-02-06 NOTE — Telephone Encounter (Signed)
The simvastatin was cut to 40mg  a day at the last OV.  Please clarify with pharmacy.  Please refill 40mg , #90, 3rf.  Thanks.  Please call in the others.

## 2011-02-17 ENCOUNTER — Other Ambulatory Visit (INDEPENDENT_AMBULATORY_CARE_PROVIDER_SITE_OTHER): Payer: BLUE CROSS/BLUE SHIELD | Admitting: Family Medicine

## 2011-02-17 DIAGNOSIS — E119 Type 2 diabetes mellitus without complications: Secondary | ICD-10-CM

## 2011-02-19 ENCOUNTER — Ambulatory Visit (INDEPENDENT_AMBULATORY_CARE_PROVIDER_SITE_OTHER): Payer: BLUE CROSS/BLUE SHIELD | Admitting: Family Medicine

## 2011-02-19 ENCOUNTER — Encounter: Payer: Self-pay | Admitting: Family Medicine

## 2011-02-19 VITALS — BP 150/88 | HR 69 | Temp 97.8°F | Ht 69.0 in | Wt 242.0 lb

## 2011-02-19 DIAGNOSIS — I10 Essential (primary) hypertension: Secondary | ICD-10-CM

## 2011-02-19 DIAGNOSIS — E119 Type 2 diabetes mellitus without complications: Secondary | ICD-10-CM

## 2011-02-19 DIAGNOSIS — F17200 Nicotine dependence, unspecified, uncomplicated: Secondary | ICD-10-CM

## 2011-02-19 DIAGNOSIS — M25569 Pain in unspecified knee: Secondary | ICD-10-CM

## 2011-02-19 NOTE — Assessment & Plan Note (Signed)
Improved, esp considering the steroid injection.  Continue to work on [place].  Recheck A1c 3 months.

## 2011-02-19 NOTE — Assessment & Plan Note (Signed)
Improved, s/p injection.

## 2011-02-19 NOTE — Progress Notes (Signed)
Diabetes:  Using medications without difficulties:yes Hypoglycemic episodes:no Hyperglycemic episodes: as below Feet problems:no Blood Sugars averaging: ~120 in AM eye exam within last year: pending- he's going soon.  A1c some better at 7.8.  D/w pt. Down 8 lbs with diet change.  Low carb diet.    Cards eval- he's going to call about this.  He got his records to take over.    Knee pain.  He had ortho eval.  He asked about injection.  He had steroid injection.  He had a brief bump in glucose but o/w it was controlled.  Pain in knee is better.    Smoking- ~1.5 PPD.  We talked about options.  He's thinking about electric/smokeless cigarettes.  It's worth trying and we talked about it.    PMH and SH reviewed  Meds, vitals, and allergies reviewed.   ROS: See HPI.  Otherwise negative.    GEN: nad, alert and oriented HEENT: mucous membranes moist NECK: supple w/o LA CV: rrr. PULM: ctab, no inc wob ABD: soft, +bs EXT: no edema SKIN: no acute rash  Diabetic foot exam: Normal inspection No skin breakdown No calluses  Normal DP pulses Normal sensation to light touch and monofilament Nails normal

## 2011-02-19 NOTE — Assessment & Plan Note (Signed)
D/w pt about cessation.

## 2011-02-19 NOTE — Assessment & Plan Note (Signed)
Not at goal, continue weight loss.  D/w pt about salt in diet.

## 2011-02-19 NOTE — Patient Instructions (Signed)
Keep working on [employer].  Let me know if you want to talk about stopping smoking.  Call about the heart and eye appointments. Take care.  Glad to see you.   Recheck A1c in 3 months with 30 min OV a few days after that.

## 2011-03-20 ENCOUNTER — Other Ambulatory Visit: Payer: Self-pay | Admitting: *Deleted

## 2011-03-20 MED ORDER — ALPRAZOLAM 0.5 MG PO TABS: 0.5000 mg | ORAL_TABLET | Freq: Four times a day (QID) | ORAL | Status: DC | PRN

## 2011-03-20 NOTE — Telephone Encounter (Signed)
Please call in

## 2011-03-21 NOTE — Telephone Encounter (Signed)
Rx. Phoned to pharmacy

## 2011-04-08 ENCOUNTER — Other Ambulatory Visit: Payer: Self-pay | Admitting: *Deleted

## 2011-04-08 MED ORDER — GLUCOSE BLOOD VI STRP: ORAL_STRIP | Status: AC

## 2011-05-08 ENCOUNTER — Encounter: Payer: Self-pay | Admitting: Cardiovascular Disease

## 2011-05-12 ENCOUNTER — Ambulatory Visit (INDEPENDENT_AMBULATORY_CARE_PROVIDER_SITE_OTHER): Payer: BLUE CROSS/BLUE SHIELD | Admitting: Cardiovascular Disease

## 2011-05-12 ENCOUNTER — Encounter: Payer: Self-pay | Admitting: Cardiovascular Disease

## 2011-05-12 ENCOUNTER — Encounter: Payer: Self-pay | Admitting: *Deleted

## 2011-05-12 DIAGNOSIS — R0602 Shortness of breath: Secondary | ICD-10-CM

## 2011-05-12 DIAGNOSIS — E119 Type 2 diabetes mellitus without complications: Secondary | ICD-10-CM

## 2011-05-12 DIAGNOSIS — R079 Chest pain, unspecified: Secondary | ICD-10-CM

## 2011-05-12 DIAGNOSIS — F17200 Nicotine dependence, unspecified, uncomplicated: Secondary | ICD-10-CM

## 2011-05-12 DIAGNOSIS — E785 Hyperlipidemia, unspecified: Secondary | ICD-10-CM

## 2011-05-12 DIAGNOSIS — I10 Essential (primary) hypertension: Secondary | ICD-10-CM

## 2011-05-12 MED ORDER — METOPROLOL TARTRATE 25 MG PO TABS: 25.0000 mg | ORAL_TABLET | Freq: Two times a day (BID) | ORAL | Status: DC

## 2011-05-12 NOTE — Progress Notes (Signed)
Patient ID: Joshua Roy, male    DOB: June 30, 1951, 60 y.o.   MRN: [id number]  HPI Comments: Mr. Gunsallus a very pleasant 59 year old gentleman with a history of coronary artery disease, occluded RCA noted by cardiac catheterization in 2001, residual 60% mid LAD disease at that time, 30% diagonal disease, 10% left main disease, 20% proximal left circumflex disease. He has a long history of smoking and continues to smoke at least one pack per day. He has hyperlipidemia, obesity and diabetes.   He presents to establish care. He reports that he has had some shortness of breath with exertion. 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 Recently, with digging holes to put posts in the ground, he did have some shortness of breath. He denies any lower extremity edema. Uncertain if he is having chest pain or chest tightness with shortness of breath. He does not check his blood pressure at home. He reports that he has never been on a beta blocker.  His last stress test was 2009 when he had a small fixed inferior perfusion defect, ejection fraction 51%  EKG today shows normal sinus rhythm with diffuse T-wave abnormality in the anterior lateral leads, anterior leads     Outpatient Encounter Prescriptions as of 05/12/2011  Medication Sig Dispense Refill  . albuterol (VENTOLIN HFA) 108 (90 BASE) MCG/ACT inhaler Inhale 2 puffs into the lungs every 4 (four) hours as needed.        . ALPRAZolam (XANAX) 0.5 MG tablet Take 1 tablet (0.5 mg total) by mouth every 6 (six) hours as needed.  50 tablet  0  . aspirin 81 MG tablet Take 81 mg by mouth daily.        . clopidogrel (PLAVIX) 75 MG tablet Take 1 tablet (75 mg total) by mouth daily.  30 tablet  12  . fenofibrate 160 MG tablet Take 1 tablet (160 mg total) by mouth daily.  90 tablet  3  . glucose blood (ACCU-CHEK AVIVA PLUS) test strip Use as instructed  100 each  12  . metFORMIN (GLUCOPHAGE) 1000 MG tablet TAKE 1 TABLET BY MOUTH  TWICE A DAY  180 tablet  3  . Multiple Vitamins-Minerals (MULTIVITAMIN WITH MINERALS) tablet Take 1 tablet by mouth daily.        . Omega-3 Fatty Acids (FISH OIL) 1000 MG CAPS Take by mouth 2 (two) times daily.        . sildenafil (VIAGRA) 100 MG tablet Take one half tablet 1 hour prior as needed  10 tablet  3  . simvastatin (ZOCOR) 80 MG tablet TAKE 1 TABLET BY MOUTH AT BEDTIME  90 tablet  2  . traMADol (ULTRAM) 50 MG tablet Take 1 tablet (50 mg total) by mouth every 6 (six) hours as needed for pain.  50 tablet  0    Review of Systems  Constitutional: Positive for unexpected weight change.  HENT: Negative.   Eyes: Negative.   Respiratory: Positive for shortness of breath.   Cardiovascular: Negative.   Gastrointestinal: Negative.   Musculoskeletal: Negative.   Skin: Negative.   Neurological: Negative.   Hematological: Negative.   Psychiatric/Behavioral: Negative.   All other systems reviewed and are negative.    BP 160/91  Pulse 74  Ht 5\' 10"  (1.778 m)  Wt 241 lb (109.317 kg)  BMI 34.58 kg/m2  Physical Exam  Nursing note and vitals reviewed. Constitutional: He is oriented to person, place, and time. He appears well-developed and well-nourished.  obese  HENT:  Head: Normocephalic.  Nose: Nose normal.  Mouth/Throat: Oropharynx is clear and moist.  Eyes: Conjunctivae are normal. Pupils are equal, round, and reactive to light.  Neck: Normal range of motion. Neck supple. No JVD present.  Cardiovascular: Normal rate, regular rhythm, S1 normal, S2 normal, normal heart sounds and intact distal pulses.  Exam reveals no gallop and no friction rub.   No murmur heard. Pulmonary/Chest: Effort normal and breath sounds normal. No respiratory distress. He has no wheezes. He has no rales. He exhibits no tenderness.  Abdominal: Soft. Bowel sounds are normal. He exhibits no distension. There is no tenderness.  Musculoskeletal: Normal range of motion. He exhibits no edema and no  tenderness.  Lymphadenopathy:    He has no cervical adenopathy.  Neurological: He is alert and oriented to person, place, and time. Coordination normal.  Skin: Skin is warm and dry. No rash noted. No erythema.  Psychiatric: He has a normal mood and affect. His behavior is normal. Judgment and thought content normal.           Assessment and Plan

## 2011-05-12 NOTE — Assessment & Plan Note (Signed)
We have strongly encouraged him to stop smoking. There are many modalities. He will talk with his wife whether he would like to try chantix. She also smokes which will make it difficult for him

## 2011-05-12 NOTE — Assessment & Plan Note (Signed)
We have stressed to him the importance of hemoglobin A1c less than 7. He will continue to work on his diet

## 2011-05-12 NOTE — Patient Instructions (Addendum)
You are doing well. Please start metoprolol 25 in the am and PM If you have significant fatigue, cut the pill in half We will schedule you for the stress test Please call us if you have new issues that need to be addressed before your next appt.   The office will contact you for a follow up Appt. In 6 months  Your physician has requested that you have en exercise stress myoview. For further information please visit [email or web address]. Please follow instruction sheet, as given. HOLD METOPROLOL THE EVENING PRIOR TO PROCEDURE AND MORNING OF.

## 2011-05-12 NOTE — Assessment & Plan Note (Signed)
Blood pressure is elevated today. We will add metoprolol tartrate 25 mg b.i.d.. If he has profound fatigue, we have suggested he cut this in half and take BID

## 2011-05-12 NOTE — Assessment & Plan Note (Signed)
Cholesterol is slightly above goal. I suspect that it should improve with his most recent weight loss. We will continue him on his current medications.

## 2011-05-12 NOTE — Assessment & Plan Note (Signed)
Shortness of breath is concerning for angina. He does have diabetes, continues to smoke, and 60% LAD disease 10 years ago. Last stress test was 3 years ago. We have ordered a treadmill Myoview further later this week.

## 2011-05-14 ENCOUNTER — Other Ambulatory Visit (INDEPENDENT_AMBULATORY_CARE_PROVIDER_SITE_OTHER): Payer: BC Managed Care – PPO

## 2011-05-14 DIAGNOSIS — E119 Type 2 diabetes mellitus without complications: Secondary | ICD-10-CM

## 2011-05-15 ENCOUNTER — Ambulatory Visit: Payer: Self-pay | Admitting: Cardiovascular Disease

## 2011-05-15 DIAGNOSIS — R079 Chest pain, unspecified: Secondary | ICD-10-CM

## 2011-05-19 ENCOUNTER — Other Ambulatory Visit: Payer: Self-pay | Admitting: *Deleted

## 2011-05-19 ENCOUNTER — Encounter: Payer: Self-pay | Admitting: *Deleted

## 2011-05-19 MED ORDER — ALPRAZOLAM 0.5 MG PO TABS: 0.5000 mg | ORAL_TABLET | Freq: Four times a day (QID) | ORAL | Status: DC | PRN

## 2011-05-19 NOTE — Telephone Encounter (Signed)
Please call in, not pharmacy on label.

## 2011-05-20 NOTE — Telephone Encounter (Signed)
Medication phoned to pharmacy.  

## 2011-05-21 ENCOUNTER — Encounter: Payer: Self-pay | Admitting: Family Medicine

## 2011-05-21 ENCOUNTER — Encounter: Payer: Self-pay | Admitting: Cardiovascular Disease

## 2011-05-21 ENCOUNTER — Ambulatory Visit (INDEPENDENT_AMBULATORY_CARE_PROVIDER_SITE_OTHER): Payer: BC Managed Care – PPO | Admitting: Family Medicine

## 2011-05-21 DIAGNOSIS — E785 Hyperlipidemia, unspecified: Secondary | ICD-10-CM

## 2011-05-21 DIAGNOSIS — I2510 Atherosclerotic heart disease of native coronary artery without angina pectoris: Secondary | ICD-10-CM

## 2011-05-21 DIAGNOSIS — F411 Generalized anxiety disorder: Secondary | ICD-10-CM

## 2011-05-21 DIAGNOSIS — E119 Type 2 diabetes mellitus without complications: Secondary | ICD-10-CM

## 2011-05-21 MED ORDER — DIAZEPAM 5 MG PO TABS: 2.5000 mg | ORAL_TABLET | Freq: Three times a day (TID) | ORAL | Status: DC | PRN

## 2011-05-21 NOTE — Assessment & Plan Note (Signed)
D/w pt about try BB and if fatigued, then cut back to 1/2 dose.  He agreed.  Will call back as needed.

## 2011-05-21 NOTE — Assessment & Plan Note (Signed)
Change to valium for prn use with sedation caution.  He'll call back with update as needed.  He was using this episodically and hopefully with business improving, he'll do some better.

## 2011-05-21 NOTE — Progress Notes (Signed)
Anxiety.  Inc in job stress.  Asking about getting off xanax and changing to valium.  He had a 'hangover' from the xanax and asked about changing.  He had good effect o/w with episodic use.  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  He didn't fill the last xanax rx.    CAD:  Concerned about potential side effects from metoprolol and he didn't want to start it.   We talked about this.  See plan.   Diabetes:  Using medications without difficulties: yes Hypoglycemic episodes: no Hyperglycemic episodes: no Feet problems: no Blood Sugars averaging: ~120-130 eye exam within last year: yes  Elevated Cholesterol: Using medications without problems:yes Muscle aches: no Diet compliance: discussed today Exercise: at home, around the house  PMH and SH reviewed.   Vital signs, Meds and allergies reviewed.  ROS: See HPI.  Otherwise nontributory.   GEN: nad, alert and oriented HEENT: mucous membranes moist NECK: supple w/o LA CV: rrr PULM: ctab, no inc wob ABD: soft, +bs EXT: no edema SKIN: no acute rash  Diabetic foot exam: Normal inspection No skin breakdown No calluses  Normal DP pulses Normal sensation to light tough and monofilament Nails normal

## 2011-05-21 NOTE — Assessment & Plan Note (Signed)
Current meds tolerated, given his h/o CAD, no change in statin.  No aches.

## 2011-05-21 NOTE — Assessment & Plan Note (Signed)
Not at goal, but some improvement. Continue with diet and exercise and current meds.  Recheck A1c in 6 months.

## 2011-05-21 NOTE — Patient Instructions (Addendum)
Schedule a physical in April 2013 with labs ahead of time.  Try to keep working on your diet and weight.   Take care and try to plan ahead for the holidays.  Take the valium instead of the xanax.  If you have trouble with the metoprolol, let me know.

## 2011-05-22 ENCOUNTER — Telehealth: Payer: Self-pay | Admitting: *Deleted

## 2011-05-22 NOTE — Telephone Encounter (Signed)
I was asked by Dr. Damita Dunnings yesterday (Oct. 24,2012) to call and cancel the prescription for Alprazolam for this patient that was sent in on 05/19/11.  I called and left this message on the pharmacy voicemail.  Early this morning, I received a call from the pharmacy saying that the prescription that we wanted cancelled had already been picked up.  Dr. Damita Dunnings asked me to call the patient and ask that the prescription be returned to him (Dr. Damita Dunnings).  The patient says it was picked up by his wife when she went to get her medications and that he's not going to bring it back up here but he will return it to the pharmacy.

## 2011-05-22 NOTE — Telephone Encounter (Signed)
Noted.  Please have pharmacy on notice for the return of the rx.

## 2011-05-22 NOTE — Telephone Encounter (Signed)
Pharmacy advised  

## 2011-05-23 ENCOUNTER — Telehealth: Payer: Self-pay | Admitting: *Deleted

## 2011-05-23 NOTE — Telephone Encounter (Signed)
Pharmacist from [facility] called.  They were told to have pt bring the xanax that he was given back in, which he did, so now they are waiting for a script for something else to be called in.  Please advise.

## 2011-05-23 NOTE — Telephone Encounter (Signed)
LMOVM to submit new Rx to pharmacy for filling.

## 2011-05-23 NOTE — Telephone Encounter (Signed)
I called pharmacy.  They are now aware that I printed off the valium rx.  Pt can get that filled.  Please notify pt to take it in (I don't think it's been dropped off yet).  Thanks.

## 2011-06-24 ENCOUNTER — Encounter: Payer: Self-pay | Admitting: Cardiovascular Disease

## 2011-08-11 ENCOUNTER — Other Ambulatory Visit: Payer: Self-pay | Admitting: Internal Medicine

## 2011-08-11 NOTE — Telephone Encounter (Signed)
Pharmacy faxed refill request last fill date was on 10.26.12.  Was last on 10.24.12

## 2011-08-12 MED ORDER — DIAZEPAM 5 MG PO TABS: 2.5000 mg | ORAL_TABLET | Freq: Three times a day (TID) | ORAL | Status: DC | PRN

## 2011-08-12 NOTE — Telephone Encounter (Signed)
Left message on machine at home advising patient that Rx is ready for pick up will be left at front desk.

## 2011-08-12 NOTE — Telephone Encounter (Signed)
Please give to pt.

## 2011-10-20 ENCOUNTER — Telehealth: Payer: Self-pay | Admitting: Family Medicine

## 2011-10-20 NOTE — Telephone Encounter (Signed)
I called pt.  Pain in L side of back, not very low in back.  Progressive since yesterday.  Slept on a heating pad.  Throbbing pain.  No blood in urine, no dysuria, no abd pain.  Never had kidney stones, no radiation.  No fevers.  No triggers known.  The area isn't tender.  Benign hx, rec stretching, local heat and tylenol.  Call back as needed.

## 2011-10-20 NOTE — Telephone Encounter (Signed)
Pt called, having pain on his left side back area, this pain began on Sunday. Says pain is getting worse as the day goes on. Call back # [phone].Marland KitchenMarland Kitchen

## 2011-12-03 ENCOUNTER — Other Ambulatory Visit: Payer: Self-pay | Admitting: Family Medicine

## 2011-12-03 MED ORDER — DIAZEPAM 5 MG PO TABS: 2.5000 mg | ORAL_TABLET | Freq: Three times a day (TID) | ORAL | Status: DC | PRN

## 2011-12-03 NOTE — Telephone Encounter (Signed)
Rx called in as directed.   

## 2011-12-03 NOTE — Telephone Encounter (Signed)
Ok to refill 

## 2011-12-03 NOTE — Telephone Encounter (Signed)
Patient left message and requesting refill on generic Valium. Please call back.

## 2011-12-03 NOTE — Telephone Encounter (Signed)
Okay to fill, please call in.

## 2012-01-20 ENCOUNTER — Other Ambulatory Visit: Payer: Self-pay | Admitting: Family Medicine

## 2012-01-21 NOTE — Telephone Encounter (Signed)
OK to refill? No recent labs.

## 2012-01-21 NOTE — Telephone Encounter (Signed)
Due for CPE.  Needs labs ahead.  rx sent.

## 2012-01-22 NOTE — Telephone Encounter (Signed)
LMOVM of home phone.

## 2012-01-26 ENCOUNTER — Other Ambulatory Visit: Payer: Self-pay | Admitting: Family Medicine

## 2012-01-27 NOTE — Telephone Encounter (Signed)
Sent!

## 2012-01-27 NOTE — Telephone Encounter (Signed)
Electronic refill request

## 2012-01-29 ENCOUNTER — Other Ambulatory Visit: Payer: Self-pay | Admitting: Family Medicine

## 2012-03-09 ENCOUNTER — Other Ambulatory Visit: Payer: Self-pay | Admitting: Family Medicine

## 2012-03-25 ENCOUNTER — Other Ambulatory Visit: Payer: Self-pay | Admitting: Family Medicine

## 2012-03-25 NOTE — Telephone Encounter (Signed)
Please call in valium.  Needs to schedule a physical with labs ahead of time.  Was due for physical in 4/13.

## 2012-03-25 NOTE — Telephone Encounter (Signed)
Electronic refill request.  Pt last seen in October 2012.  Please advise.

## 2012-03-26 NOTE — Telephone Encounter (Signed)
Medication phoned to pharmacy.  

## 2012-03-26 NOTE — Telephone Encounter (Signed)
Patient says he does not have insurance right now and will try to get it under way by January and he will schedule that CPE at the first of the year.  He asked me to put this notice in his chart so that he will not continue to get these phone calls.  I am not aware of how to do that but will ask for help.

## 2012-04-07 ENCOUNTER — Other Ambulatory Visit: Payer: Self-pay | Admitting: Family Medicine

## 2012-04-08 NOTE — Telephone Encounter (Signed)
This is the gentleman that "blesses me out" every time I call to let him know that he needs to schedule an appt for his refills.  He states he does not have insurance right now and is struggling with his income and hopes to have insurance beginning in January.  He asks that we refill his meds until then.

## 2012-04-09 NOTE — Telephone Encounter (Signed)
Sent!

## 2012-04-12 ENCOUNTER — Other Ambulatory Visit: Payer: Self-pay | Admitting: Family Medicine

## 2012-04-25 ENCOUNTER — Other Ambulatory Visit: Payer: Self-pay | Admitting: Family Medicine

## 2012-05-15 ENCOUNTER — Other Ambulatory Visit: Payer: Self-pay | Admitting: Family Medicine

## 2012-06-15 ENCOUNTER — Telehealth: Payer: Self-pay

## 2012-06-15 NOTE — Telephone Encounter (Signed)
Pt has questions to decide what health care plan to choose; wants ball park figures of different charges for office visits and test.Advise pt I do not have those figures; gave pt billing info # [phone].

## 2012-06-29 ENCOUNTER — Other Ambulatory Visit: Payer: Self-pay | Admitting: Family Medicine

## 2012-06-29 NOTE — Telephone Encounter (Signed)
This is the patient that reminds me each time that he cannot schedule an appt right now because he has no insurance right now.  He is in hopes of getting insurance in January.  Please advise.

## 2012-06-30 NOTE — Telephone Encounter (Signed)
Please call in.  Thanks.   

## 2012-06-30 NOTE — Telephone Encounter (Signed)
Rx called to pharmacy as instructed. 

## 2012-07-24 ENCOUNTER — Other Ambulatory Visit: Payer: Self-pay | Admitting: Family Medicine

## 2012-08-25 ENCOUNTER — Other Ambulatory Visit: Payer: Self-pay | Admitting: *Deleted

## 2012-08-25 NOTE — Telephone Encounter (Signed)
OK to refill? Has not been seen or had labs in over 1 year. Wants 90 day supply.

## 2012-08-26 ENCOUNTER — Encounter: Payer: Self-pay | Admitting: *Deleted

## 2012-08-26 MED ORDER — METFORMIN HCL 1000 MG PO TABS: 1000.0000 mg | ORAL_TABLET | Freq: Two times a day (BID) | ORAL | Status: DC

## 2012-08-26 MED ORDER — FENOFIBRATE 160 MG PO TABS: 160.0000 mg | ORAL_TABLET | Freq: Every day | ORAL | Status: DC

## 2012-08-26 NOTE — Telephone Encounter (Signed)
Sent.  Needs 55min OV.

## 2012-09-20 ENCOUNTER — Ambulatory Visit (INDEPENDENT_AMBULATORY_CARE_PROVIDER_SITE_OTHER): Payer: PRIVATE HEALTH INSURANCE | Admitting: Family Medicine

## 2012-09-20 ENCOUNTER — Encounter: Payer: Self-pay | Admitting: Family Medicine

## 2012-09-20 VITALS — BP 132/90 | HR 73 | Temp 98.5°F | Wt 250.0 lb

## 2012-09-20 DIAGNOSIS — I2510 Atherosclerotic heart disease of native coronary artery without angina pectoris: Secondary | ICD-10-CM

## 2012-09-20 DIAGNOSIS — I10 Essential (primary) hypertension: Secondary | ICD-10-CM

## 2012-09-20 DIAGNOSIS — E785 Hyperlipidemia, unspecified: Secondary | ICD-10-CM

## 2012-09-20 DIAGNOSIS — E119 Type 2 diabetes mellitus without complications: Secondary | ICD-10-CM

## 2012-09-20 LAB — COMPREHENSIVE METABOLIC PANEL
Albumin: 4.2 g/dL (ref 3.5–5.2)
CO2: 30 mEq/L (ref 19–32)
GFR: 72.58 mL/min (ref >60.00)
Glucose, Bld: 165 mg/dL — ABNORMAL HIGH (ref 70–99)
Potassium: 5.3 mEq/L — ABNORMAL HIGH (ref 3.5–5.1)
Sodium: 140 mEq/L (ref 135–145)
Total Bilirubin: 0.5 mg/dL (ref 0.3–1.2)
Total Protein: 7.3 g/dL (ref 6.0–8.3)

## 2012-09-20 LAB — LIPID PANEL: HDL: 37.3 mg/dL — ABNORMAL LOW (ref >39.00)

## 2012-09-20 LAB — HEMOGLOBIN A1C: Hgb A1c MFr Bld: 8.7 % — ABNORMAL HIGH (ref 4.6–6.5)

## 2012-09-20 MED ORDER — SIMVASTATIN 80 MG PO TABS: ORAL_TABLET | ORAL | Status: DC

## 2012-09-20 MED ORDER — GLUCOSE BLOOD VI STRP: ORAL_STRIP | Status: DC

## 2012-09-20 MED ORDER — CLOPIDOGREL BISULFATE 75 MG PO TABS: 75.0000 mg | ORAL_TABLET | Freq: Every day | ORAL | Status: DC

## 2012-09-20 MED ORDER — FENOFIBRATE 160 MG PO TABS: 160.0000 mg | ORAL_TABLET | Freq: Every day | ORAL | Status: DC

## 2012-09-20 MED ORDER — METFORMIN HCL 1000 MG PO TABS: 1000.0000 mg | ORAL_TABLET | Freq: Two times a day (BID) | ORAL | Status: DC

## 2012-09-20 MED ORDER — ALPRAZOLAM 0.5 MG PO TABS: ORAL_TABLET | ORAL | Status: DC

## 2012-09-20 MED ORDER — METOPROLOL TARTRATE 25 MG PO TABS: 25.0000 mg | ORAL_TABLET | Freq: Two times a day (BID) | ORAL | Status: DC

## 2012-09-20 NOTE — Progress Notes (Signed)
Diabetes:  Using medications without difficulties: yes Hypoglycemic episodes: very rare, if prolonged fasting Hyperglycemic episodes: no sx Feet problems: no Blood Sugars averaging: not checked eye exam within last year: due, he'll call about that  Hypertension:    Using medication without problems or lightheadedness: no meds currently Chest pain with exertion: no Edema: no Short of breath: no Pt agreed to try the metoprolol.   Elevated Cholesterol: Using medications without problems: yes Muscle aches: some occ cramping at night, d/w pt Diet compliance: d/w pt.  Exercise: "very little."  D/w pt.   CAD and d/w pt about smoking cessation.  He's thinking about using the E cig.  PMH and SH reviewed.   Vital signs, Meds and allergies reviewed.  ROS: See HPI.  Otherwise nontributory.   GEN: nad, alert and oriented HEENT: mucous membranes moist NECK: supple w/o LA CV: rrr.  no murmur PULM: ctab, no inc wob ABD: soft, +bs EXT: no edema SKIN: no acute rash  Diabetic foot exam: Normal inspection No skin breakdown No calluses  Normal DP pulses Normal sensation to light tough and monofilament Nails normal

## 2012-09-20 NOTE — Assessment & Plan Note (Signed)
D/w pt about smoking cessation.   

## 2012-09-20 NOTE — Assessment & Plan Note (Signed)
Needs to work on diet and weight.   Continue current meds for now.  It aches are worse, he'll notify me.

## 2012-09-20 NOTE — Assessment & Plan Note (Signed)
He agreed to try the metoprolol. He'll cut the dose in half if more fatigued on the medicine.

## 2012-09-20 NOTE — Assessment & Plan Note (Signed)
Needs to work on diet and weight.  Would recheck A1c in 3 months, continue current meds for now.

## 2012-09-20 NOTE — Patient Instructions (Addendum)
Go to the lab on the way out.  We'll contact you with your lab report. Take care.  Keep working on your weight and exercise.   Recheck in 6 months (correction- based on labs would recheck in 3 months).  Labs ahead of time.

## 2012-09-21 ENCOUNTER — Encounter: Payer: Self-pay | Admitting: *Deleted

## 2012-11-20 ENCOUNTER — Other Ambulatory Visit: Payer: Self-pay | Admitting: Cardiovascular Disease

## 2012-11-22 NOTE — Telephone Encounter (Signed)
Lmtcb regarding refill. Pt has not been seen since 04/2011 overdue for 6 month f/u.

## 2012-11-24 ENCOUNTER — Ambulatory Visit: Payer: PRIVATE HEALTH INSURANCE | Admitting: Cardiovascular Disease

## 2012-11-30 ENCOUNTER — Other Ambulatory Visit: Payer: Self-pay | Admitting: Family Medicine

## 2012-12-02 ENCOUNTER — Other Ambulatory Visit: Payer: Self-pay | Admitting: Family Medicine

## 2012-12-02 DIAGNOSIS — E119 Type 2 diabetes mellitus without complications: Secondary | ICD-10-CM

## 2012-12-10 ENCOUNTER — Encounter: Payer: Self-pay | Admitting: Radiology

## 2012-12-13 ENCOUNTER — Other Ambulatory Visit (INDEPENDENT_AMBULATORY_CARE_PROVIDER_SITE_OTHER): Payer: PRIVATE HEALTH INSURANCE

## 2012-12-13 DIAGNOSIS — E119 Type 2 diabetes mellitus without complications: Secondary | ICD-10-CM

## 2012-12-13 LAB — HEMOGLOBIN A1C: Hgb A1c MFr Bld: 8.8 % — ABNORMAL HIGH (ref 4.6–6.5)

## 2012-12-16 ENCOUNTER — Ambulatory Visit: Payer: PRIVATE HEALTH INSURANCE | Admitting: Cardiovascular Disease

## 2012-12-16 ENCOUNTER — Encounter: Payer: Self-pay | Admitting: Family Medicine

## 2012-12-16 ENCOUNTER — Ambulatory Visit (INDEPENDENT_AMBULATORY_CARE_PROVIDER_SITE_OTHER): Payer: PRIVATE HEALTH INSURANCE | Admitting: Family Medicine

## 2012-12-16 ENCOUNTER — Telehealth: Payer: Self-pay | Admitting: Family Medicine

## 2012-12-16 VITALS — BP 146/102 | HR 73 | Temp 97.7°F | Wt 245.2 lb

## 2012-12-16 DIAGNOSIS — F1490 Cocaine use, unspecified, uncomplicated: Secondary | ICD-10-CM | POA: Insufficient documentation

## 2012-12-16 DIAGNOSIS — E119 Type 2 diabetes mellitus without complications: Secondary | ICD-10-CM

## 2012-12-16 MED ORDER — METOPROLOL TARTRATE 25 MG PO TABS: 25.0000 mg | ORAL_TABLET | Freq: Two times a day (BID) | ORAL | Status: DC

## 2012-12-16 MED ORDER — GLIMEPIRIDE 2 MG PO TABS: 2.0000 mg | ORAL_TABLET | Freq: Every day | ORAL | Status: DC

## 2012-12-16 NOTE — Progress Notes (Signed)
Diabetes:  Using medications without difficulties: yes Hypoglycemic episodes: only if prolonged fasting.   Hyperglycemic episodes: not checked often, had checked some recently Feet problems:no Blood Sugars averaging: ~140-150 in AM recently A1c 8.8.  Discussed.  Not improved from last check.  He needs to lose weight.  Discussed.   "I got a sweet tooth." discussed.   We talked about a low carb diet. I offered nutrition eval.  He declined.    Has been off BB for a month with cards f/u pending.  D/w pt about his being overdue for cards f/u.  Still smoking.  Discussed, encouraged cessation.  No chest pain.   PMH and SH reviewed  Meds, vitals, and allergies reviewed.   ROS: See HPI.  Otherwise negative.    GEN: nad, alert and oriented HEENT: mucous membranes moist NECK: supple w/o LA CV: rrr. PULM: ctab, no inc wob ABD: soft, +bs EXT: no edema SKIN: no acute rash

## 2012-12-16 NOTE — Telephone Encounter (Signed)
Called pt.  Cocaine pos on testing.  Per patient, "I didn't know you were going to test me for that." He admitted recent use but became irritated on the phone. I advised him to stop talking and he complied.  Advised pt that we wouldn't rx controlled meds.  He declined NA information.  "I don't have a drug problem."  Advised him that cocaine use, esp with DM2 and his cardiac hx, would not be a healthy habit.  I'll still see him as a patient, with the restrictions above.  He was not dismissed from the practice.

## 2012-12-16 NOTE — Patient Instructions (Addendum)
Start the glimepiride but cut it in half if you have low sugars.   Recheck A1c in 3 months.  Come see me after that.   Take care.

## 2012-12-16 NOTE — Assessment & Plan Note (Signed)
D/w pt.  Would start glimepride in meantime, work on diet and recheck in 3 months.  D/w pt about smoking, weight, labs.  BB rx sent.  Will defer ARB for now. Prev ACE cough noted.

## 2012-12-23 ENCOUNTER — Encounter: Payer: Self-pay | Admitting: Family Medicine

## 2013-01-04 ENCOUNTER — Ambulatory Visit: Payer: PRIVATE HEALTH INSURANCE | Admitting: Cardiovascular Disease

## 2013-01-11 ENCOUNTER — Ambulatory Visit (INDEPENDENT_AMBULATORY_CARE_PROVIDER_SITE_OTHER): Payer: PRIVATE HEALTH INSURANCE | Admitting: Cardiovascular Disease

## 2013-01-11 ENCOUNTER — Encounter: Payer: Self-pay | Admitting: Cardiovascular Disease

## 2013-01-11 VITALS — BP 124/82 | HR 62 | Ht 69.0 in | Wt 248.8 lb

## 2013-01-11 DIAGNOSIS — I2510 Atherosclerotic heart disease of native coronary artery without angina pectoris: Secondary | ICD-10-CM

## 2013-01-11 DIAGNOSIS — I10 Essential (primary) hypertension: Secondary | ICD-10-CM

## 2013-01-11 DIAGNOSIS — E119 Type 2 diabetes mellitus without complications: Secondary | ICD-10-CM

## 2013-01-11 DIAGNOSIS — E785 Hyperlipidemia, unspecified: Secondary | ICD-10-CM

## 2013-01-11 DIAGNOSIS — F17200 Nicotine dependence, unspecified, uncomplicated: Secondary | ICD-10-CM

## 2013-01-11 MED ORDER — SIMVASTATIN 80 MG PO TABS: 80.0000 mg | ORAL_TABLET | Freq: Every day | ORAL | Status: DC

## 2013-01-11 NOTE — Progress Notes (Signed)
Patient ID: JOANNE NOSBISCH, male    DOB: 1950/10/20, 62 y.o.   MRN: [place]:[id number]  HPI Comments: Mr. Landau is a very pleasant 62 year old gentleman with a history of coronary artery disease, occluded mid to distal RCA  by cardiac catheterization in 2001, residual 60% proximal to mid LAD disease at that time, 30% diagonal disease, 10% left main disease, 20% proximal left circumflex disease. He has a long history of smoking and continues to smoke at least one pack per day. He has hyperlipidemia, obesity and diabetes (poorly controlled).  He was last seen in 2012 at which time he had no symptoms. He presents today and states that we will not renew his medications and he therefore came in for followup visit. He has been having trouble finding good health insurance as he is a very high deductible. States that he had a hemoglobin A1c recently that cost him $500 out of pocket.  He is reluctant to have any testing at this time including even an EKG as he does not know what the cost will be out of pocket. He is looking forward to 60 12/14/2008 son up for Medicare.  He did restart his metoprolol when he saw Dr. Damita Dunnings recently and has not had any significant side effects.  He's been trying to lose weight and feels that he has lost 5 pounds in the past several months   His last stress test was 2009 when he had a small fixed inferior perfusion defect, ejection fraction 51%  Previous EKG showed normal sinus rhythm with diffuse T-wave abnormality in the anterior lateral leads, anterior leads     Outpatient Encounter Prescriptions as of 01/11/2013  Medication Sig Dispense Refill  . aspirin 81 MG tablet Take 81 mg by mouth daily.        . clopidogrel (PLAVIX) 75 MG tablet Take 1 tablet (75 mg total) by mouth daily.  30 tablet  12  . fenofibrate 160 MG tablet Take 1 tablet (160 mg total) by mouth daily.  30 tablet  12  . glimepiride (AMARYL) 2 MG tablet Take 1 mg by mouth daily before breakfast.       . glucose blood (ACCU-CHEK AVIVA PLUS) test strip Use as instructed to check sugar, dx 250.00  50 each  3  . metFORMIN (GLUCOPHAGE) 1000 MG tablet TAKE 1 TABLET BY MOUTH TWICE A DAY  180 tablet  0  . metoprolol tartrate (LOPRESSOR) 25 MG tablet Take 1 tablet (25 mg total) by mouth 2 (two) times daily.  60 tablet  12  . Multiple Vitamins-Minerals (MULTIVITAMIN WITH MINERALS) tablet Take 1 tablet by mouth daily.        . Omega-3 Fatty Acids (FISH OIL) 1000 MG CAPS Take by mouth 2 (two) times daily.        Marland Kitchen VIAGRA 100 MG tablet TAKE  1/2 TABLET BY MOUTH 1 HOUR PRIOR TO RELATIONS  24 tablet  5  . simvastatin (ZOCOR) 80 MG tablet TAKE 1/2 TABLET BY MOUTH EVERY NIGHT AT BEDTIME  15 tablet  12    Review of Systems  HENT: Negative.   Eyes: Negative.   Cardiovascular: Negative.   Gastrointestinal: Negative.   Musculoskeletal: Negative.   Skin: Negative.   Neurological: Negative.   Psychiatric/Behavioral: Negative.   All other systems reviewed and are negative.    BP 124/82  Pulse 62  Ht 5\' 9"  (1.753 m)  Wt 248 lb 12 oz (112.832 kg)  BMI 36.72 kg/m2  Physical Exam  Nursing  note and vitals reviewed. Constitutional: He is oriented to person, place, and time. He appears well-developed and well-nourished.  obese  HENT:  Head: Normocephalic.  Nose: Nose normal.  Mouth/Throat: Oropharynx is clear and moist.  Eyes: Conjunctivae are normal. Pupils are equal, round, and reactive to light.  Neck: Normal range of motion. Neck supple. No JVD present.  Cardiovascular: Normal rate, regular rhythm, S1 normal, S2 normal, normal heart sounds and intact distal pulses.  Exam reveals no gallop and no friction rub.   No murmur heard. Pulmonary/Chest: Effort normal and breath sounds normal. No respiratory distress. He has no wheezes. He has no rales. He exhibits no tenderness.  Abdominal: Soft. Bowel sounds are normal. He exhibits no distension. There is no tenderness.  Musculoskeletal: Normal range of  motion. He exhibits no edema and no tenderness.  Lymphadenopathy:    He has no cervical adenopathy.  Neurological: He is alert and oriented to person, place, and time. Coordination normal.  Skin: Skin is warm and dry. No rash noted. No erythema.  Psychiatric: He has a normal mood and affect. His behavior is normal. Judgment and thought content normal.      Assessment and Plan

## 2013-01-11 NOTE — Assessment & Plan Note (Signed)
Currently with no symptoms of angina. No further workup at this time. Continue current medication regimen. He did not want an EKG today as he was unsure of the cost out of pocket. He does not want any testing at this time and prefers to wait until he is covered by Medicare.

## 2013-01-11 NOTE — Patient Instructions (Addendum)
You are doing well. Please increase the simvastatin to 80 mg daily  Call for any shortness of breath or chest pain  Please call us if you have new issues that need to be addressed before your next appt.  Your physician wants you to follow-up in: 12 months.  You will receive a reminder letter in the mail two months in advance. If you don't receive a letter, please call our office to schedule the follow-up appointment.

## 2013-01-11 NOTE — Assessment & Plan Note (Signed)
We have encouraged continued exercise, careful diet management in an effort to lose weight. Hemoglobin A1c is elevated. Encouraged him to continue his weight loss program.

## 2013-01-11 NOTE — Assessment & Plan Note (Signed)
We have encouraged him to continue to work on weaning his cigarettes and smoking cessation. He will continue to work on this and does not want any assistance with chantix.  

## 2013-01-11 NOTE — Assessment & Plan Note (Signed)
We'll increase the dose of his simvastatin to 80 mg daily which he had been taking for many years. He is uncertain at that point the dose was decreased to 40 mg. Cholesterol not at goal on recent lab check. Diabetes not well controlled and he continues to smoke.

## 2013-01-11 NOTE — Assessment & Plan Note (Signed)
Blood pressure is well controlled on today's visit. No changes made to the medications. 

## 2013-02-03 ENCOUNTER — Other Ambulatory Visit: Payer: Self-pay

## 2013-03-14 ENCOUNTER — Other Ambulatory Visit (INDEPENDENT_AMBULATORY_CARE_PROVIDER_SITE_OTHER): Payer: PRIVATE HEALTH INSURANCE

## 2013-03-14 ENCOUNTER — Other Ambulatory Visit: Payer: PRIVATE HEALTH INSURANCE

## 2013-03-14 DIAGNOSIS — E1059 Type 1 diabetes mellitus with other circulatory complications: Secondary | ICD-10-CM

## 2013-03-15 LAB — HEMOGLOBIN A1C: Hgb A1c MFr Bld: 7.6 % — ABNORMAL HIGH (ref 4.8–5.6)

## 2013-03-16 ENCOUNTER — Encounter: Payer: Self-pay | Admitting: Gastroenterology

## 2013-03-21 ENCOUNTER — Ambulatory Visit: Payer: PRIVATE HEALTH INSURANCE | Admitting: Family Medicine

## 2013-03-31 ENCOUNTER — Other Ambulatory Visit: Payer: PRIVATE HEALTH INSURANCE

## 2013-04-07 ENCOUNTER — Ambulatory Visit (INDEPENDENT_AMBULATORY_CARE_PROVIDER_SITE_OTHER): Payer: PRIVATE HEALTH INSURANCE | Admitting: Family Medicine

## 2013-04-07 ENCOUNTER — Encounter: Payer: Self-pay | Admitting: Family Medicine

## 2013-04-07 VITALS — BP 144/94 | HR 69 | Temp 98.1°F | Wt 251.5 lb

## 2013-04-07 DIAGNOSIS — E119 Type 2 diabetes mellitus without complications: Secondary | ICD-10-CM

## 2013-04-07 NOTE — Patient Instructions (Addendum)
I would get a flu shot each fall.   See about getting the flu and pneumonia shot at the pharmacy.  Recheck in 6 months at a physical.  Labs ahead of time.  Check on getting an eye exam.  Take care.

## 2013-04-07 NOTE — Progress Notes (Signed)
Diabetes:  Using medications without difficulties:yes Hypoglycemic episodes: see below Hyperglycemic episodes: no Feet problems:no Blood Sugars averaging: ~100-135 eye exam within last year:  Due, encouraged.   A1c 7.6.  This is improved. Weight is increased.  Diet discussed.   "I don't want more medicine."    He had an episode of presyncope on a hot day, fasting all day.  His BP was low at the time.  He skipped lunch and played golf all day.  That was about 1 month ago. He went home and drank some OJ.  No CP.  Not SOB.  The episode resolved.   Meds, vitals, and allergies reviewed.   ROS: See HPI.  Otherwise negative.    GEN: nad, alert and oriented HEENT: mucous membranes moist NECK: supple w/o LA CV: rrr. PULM: ctab, no inc wob ABD: soft, +bs EXT: no edema SKIN: no acute rash  Diabetic foot exam: Normal inspection No skin breakdown No calluses  Normal DP pulses Normal sensation to light touch and monofilament Nails normal

## 2013-04-08 NOTE — Assessment & Plan Note (Addendum)
Labs discussed, needs to lose weight and continue current meds.  Needs more exercise.  D/w pt about risk of hypoglycemia, needs to avoid prolonged fasting.  See instructions.

## 2013-05-13 ENCOUNTER — Inpatient Hospital Stay: Payer: Self-pay | Admitting: Internal Medicine

## 2013-05-13 ENCOUNTER — Telehealth: Payer: Self-pay | Admitting: Family Medicine

## 2013-05-13 LAB — CBC
HCT: 40.5 % (ref 40.0–52.0)
MCHC: 34.4 g/dL (ref 32.0–36.0)
MCV: 83 fL (ref 80–100)
RDW: 14.2 % (ref 11.5–14.5)
WBC: 10.8 10*3/uL — ABNORMAL HIGH (ref 3.8–10.6)

## 2013-05-13 LAB — DIFFERENTIAL
Basophil #: 0.1 10*3/uL (ref 0.0–0.1)
Basophil %: 0.9 %
Neutrophil #: 6.8 10*3/uL — ABNORMAL HIGH (ref 1.4–6.5)
Neutrophil %: 62.9 %

## 2013-05-13 LAB — COMPREHENSIVE METABOLIC PANEL
Albumin: 3.6 g/dL (ref 3.4–5.0)
Alkaline Phosphatase: 35 U/L — ABNORMAL LOW (ref 50–136)
Anion Gap: 4 — ABNORMAL LOW (ref 7–16)
Bilirubin,Total: 0.4 mg/dL (ref 0.2–1.0)
Co2: 30 mmol/L (ref 21–32)
Creatinine: 1.25 mg/dL (ref 0.60–1.30)
EGFR (African American): >60
Glucose: 169 mg/dL — ABNORMAL HIGH (ref 65–99)
Osmolality: 284 (ref 275–301)
SGOT(AST): 17 U/L (ref 15–37)
SGPT (ALT): 22 U/L (ref 12–78)
Sodium: 135 mmol/L — ABNORMAL LOW (ref 136–145)

## 2013-05-13 LAB — HEMOGLOBIN: HGB: 12.1 g/dL — ABNORMAL LOW (ref 13.0–18.0)

## 2013-05-13 LAB — LIPASE, BLOOD: Lipase: 202 U/L (ref 73–393)

## 2013-05-13 NOTE — Telephone Encounter (Signed)
Patient Information:  Caller Name: [name]  Phone: [phone]  Patient: Joshua Roy, Joshua Roy  Gender: Male  DOB: 03-17-1951  Age: 62 Years  PCP: Elsie Stain Brigitte Pulse) [facility])  Office Follow Up:  Does the office need to follow up with this patient?: No  Instructions For The Office: N/A  RN Note:  Spouse states patient had several episodes of diaphoresis 05/12/13. States patient had vomiting of "black" liquid X 1 05/13/13 and had near syncopal episode 05/12/13. States patient complains of feeling weak. Denies chest pain or dyspnea. States patient had chest tightness 05/07/13 and 05/10/13. Denies chest pain or dyspnea at present. No diarrhea. Denies melena. States patient complains of dizziness when standing. Patient states he feels too weak to ambulate. Care advice given per guidelines. Spouse advised to call 911 for transport to ED immediately. Spouse advised not to leave patient alone. Spouse verbalizes understanding and agreeable.  Symptoms  Reason For Call & Symptoms: Vomiting  Reviewed Health History In EMR: Yes  Reviewed Medications In EMR: Yes  Reviewed Allergies In EMR: Yes  Reviewed Surgeries / Procedures: Yes  Date of Onset of Symptoms: 05/12/2013  Guideline(s) Used:  Vomiting  Disposition Per Guideline:   Call EMS 911 Now  Reason For Disposition Reached:   Shock suspected (e.g., cold/pale/clammy skin, too weak to stand)  Advice Given:  N/A  Patient Will Follow Care Advice:  YES

## 2013-05-13 NOTE — Telephone Encounter (Signed)
Agree with ER.  Thanks.  

## 2013-05-14 LAB — BASIC METABOLIC PANEL
Anion Gap: 6 — ABNORMAL LOW (ref 7–16)
Chloride: 105 mmol/L (ref 98–107)
Co2: 29 mmol/L (ref 21–32)
Creatinine: 1.22 mg/dL (ref 0.60–1.30)
EGFR (African American): >60
EGFR (Non-African Amer.): >60
Glucose: 115 mg/dL — ABNORMAL HIGH (ref 65–99)
Osmolality: 288 (ref 275–301)
Potassium: 3.9 mmol/L (ref 3.5–5.1)

## 2013-05-14 LAB — CBC WITH DIFFERENTIAL/PLATELET
Eosinophil #: 0.1 10*3/uL (ref 0.0–0.7)
Eosinophil %: 2.2 %
HCT: 33.5 % — ABNORMAL LOW (ref 40.0–52.0)
Lymphocyte #: 2.7 10*3/uL (ref 1.0–3.6)
Lymphocyte %: 44.5 %
MCH: 28.6 pg (ref 26.0–34.0)
MCV: 83 fL (ref 80–100)
Monocyte #: 0.4 x10 3/mm (ref 0.2–1.0)

## 2013-05-14 LAB — PROTIME-INR: Prothrombin Time: 13.8 secs (ref 11.5–14.7)

## 2013-05-14 LAB — LIPID PANEL: Ldl Cholesterol, Calc: 44 mg/dL (ref 0–100)

## 2013-05-14 LAB — HEMOGLOBIN A1C: Hemoglobin A1C: 7.9 % — ABNORMAL HIGH (ref 4.2–6.3)

## 2013-05-16 ENCOUNTER — Encounter: Payer: Self-pay | Admitting: Family Medicine

## 2013-05-16 DIAGNOSIS — K259 Gastric ulcer, unspecified as acute or chronic, without hemorrhage or perforation: Secondary | ICD-10-CM | POA: Insufficient documentation

## 2013-05-19 ENCOUNTER — Telehealth: Payer: Self-pay

## 2013-05-19 NOTE — Telephone Encounter (Signed)
Pt has a bleeding ulcer and has been taken off the aspirin and plavix, has questions how long he should be off this. Please call.

## 2013-05-19 NOTE — Telephone Encounter (Signed)
I spoke with the patient. He states that he was in the hospital on Friday and there until Sunday. He had an upper GI and was diagnosed with two bleeding ulcers. He was discharged Sunday. His aspirin and plavix are on hold. He was seen by Dr. Gustavo Lah who performed his procedure. He is not due to see him back for about 3 weeks. He was wanting to make Dr. Rockey Situ aware and to see how long he felt like he could be off aspirin and plavix. I explained if he is actively bleeding, he should stay off his aspirin and plavix for now. Will forward to Dr. Rockey Situ for review and recommendations.

## 2013-05-20 NOTE — Telephone Encounter (Signed)
Spoke w/ pt.  States that he feels fine, appetite is good, taking medications.   He would like a specific time frame to stay off of blood thinners.   He doesn't think he is actively bleeding, denies dark stools or vomiting blood. He sees his GI doc in 3 weeks and wants to make sure that the should stay off of blood thinners for that long.  Instructed him to speak w/ Dr. Marton Redwood office to ensure that ulcers have healed before restarting.

## 2013-05-20 NOTE — Telephone Encounter (Signed)
GI doctor called me and explained the situation. We need time for his ulcer to heal. Certainly at risk without any blood thinners, but at high risk with the blood thinners

## 2013-05-30 ENCOUNTER — Telehealth: Payer: Self-pay

## 2013-05-30 MED ORDER — PNEUMOCOCCAL VAC POLYVALENT 25 MCG/0.5ML IJ INJ: 0.5000 mL | INJECTION | Freq: Once | INTRAMUSCULAR | Status: DC

## 2013-05-30 NOTE — Telephone Encounter (Signed)
Sent. Thanks.   

## 2013-05-30 NOTE — Telephone Encounter (Signed)
Patient advised.

## 2013-05-30 NOTE — Telephone Encounter (Signed)
Pt said had flu shot 05/23/13 and request order for pneumonia vaccine sent to [facility]; pt is aware will need to schedule at pharmacy 30 days after getting flu shot. Pt request cb when prescription for pneumovax sent.

## 2013-05-31 ENCOUNTER — Encounter: Payer: Self-pay | Admitting: Family Medicine

## 2013-06-12 ENCOUNTER — Other Ambulatory Visit: Payer: Self-pay | Admitting: Family Medicine

## 2013-06-13 NOTE — Telephone Encounter (Signed)
Sent!

## 2013-06-13 NOTE — Telephone Encounter (Signed)
Electronic refill request.  Please advise. 

## 2013-08-01 ENCOUNTER — Encounter: Payer: Self-pay | Admitting: Family Medicine

## 2013-08-01 ENCOUNTER — Ambulatory Visit (INDEPENDENT_AMBULATORY_CARE_PROVIDER_SITE_OTHER): Payer: PRIVATE HEALTH INSURANCE | Admitting: Family Medicine

## 2013-08-01 VITALS — BP 168/100 | HR 89 | Temp 97.7°F | Wt 256.5 lb

## 2013-08-01 DIAGNOSIS — R059 Cough, unspecified: Secondary | ICD-10-CM

## 2013-08-01 DIAGNOSIS — R05 Cough: Secondary | ICD-10-CM | POA: Insufficient documentation

## 2013-08-01 MED ORDER — BENZONATATE 200 MG PO CAPS: 200.0000 mg | ORAL_CAPSULE | Freq: Three times a day (TID) | ORAL | Status: DC | PRN

## 2013-08-01 MED ORDER — DOXYCYCLINE HYCLATE 100 MG PO TABS: 100.0000 mg | ORAL_TABLET | Freq: Two times a day (BID) | ORAL | Status: DC

## 2013-08-01 NOTE — Progress Notes (Signed)
Pre-visit discussion using our clinic review tool. No additional management support is needed unless otherwise documented below in the visit note.  He had an episode of vomiting blood in ~10/14.  No more sx in the meantime.  He had a discharge packet but forgot to bring it in today.  I asked him to bring this in.   ED worse over the last few months.  Smoker.  Taking viagra at baseline.  We discussed options.  Advised to stop smoking.    URI sx started about 8 days ago.  Cough continues.  No fevers.  Wife was sick.  More sputum and rhinorrhea than baseline.  Wheeze occ.  Smoker.  No vomiting, no diarrhea, no rash.  BP elevated and insomnia noted after taking mucinex D.   Meds, vitals, and allergies reviewed.   ROS: See HPI.  Otherwise, noncontributory.  GEN: nad, alert and oriented HEENT: mucous membranes moist, tm w/o erythema, nasal exam w/o erythema, clear discharge noted,  OP with cobblestoning, sinuses not ttp x4 NECK: supple w/o LA CV: rrr.   PULM: scattered rhonchi and scant exp wheezes B, no inc wob, speaking in complete sentences.  EXT: no edema SKIN: no acute rash

## 2013-08-01 NOTE — Assessment & Plan Note (Addendum)
Smoker, advised to quit.  Inc in sputum recently, with rhonchi on exam.  Start doxy. Didn't add on SABA due to cardiac hx and BP elevation today.  BP elevation likely from cough meds, advised to stop.  F/u if not improved, if BP not better.

## 2013-08-01 NOTE — Patient Instructions (Addendum)
Drop off the packet from the hospitalization in the meantime.   Stop the mucinex D.  If you BP doesn't go back to normal, then follow up in the clinic next week.   Start doxycycline and tessalon today.  Take care.

## 2013-08-02 ENCOUNTER — Other Ambulatory Visit: Payer: Self-pay | Admitting: Family Medicine

## 2013-08-02 ENCOUNTER — Encounter: Payer: Self-pay | Admitting: Family Medicine

## 2013-08-02 DIAGNOSIS — K2990 Gastroduodenitis, unspecified, without bleeding: Secondary | ICD-10-CM

## 2013-08-02 DIAGNOSIS — K2970 Gastritis, unspecified, without bleeding: Secondary | ICD-10-CM | POA: Insufficient documentation

## 2013-08-02 MED ORDER — PANTOPRAZOLE SODIUM 40 MG PO TBEC: 40.0000 mg | DELAYED_RELEASE_TABLET | Freq: Two times a day (BID) | ORAL | Status: DC

## 2013-09-02 ENCOUNTER — Telehealth: Payer: Self-pay | Admitting: *Deleted

## 2013-09-02 NOTE — Telephone Encounter (Signed)
Received prior auth request viagra. Auth paperwork obtained and placed in your inbox.

## 2013-09-04 NOTE — Telephone Encounter (Signed)
Done, send his DM2 labs with the forms.

## 2013-09-08 NOTE — Telephone Encounter (Signed)
Prior auth forms faxed, pending notification from insurance company.

## 2013-09-09 NOTE — Telephone Encounter (Signed)
Pt request cb when receive PA response for Viagra. Pt can be reached [phone].

## 2013-09-14 NOTE — Telephone Encounter (Signed)
Received notification from insurance that PA request for Viagra has been denied. Denial forms placed in your inbox.

## 2013-09-14 NOTE — Telephone Encounter (Signed)
Noted, notify pt.  Thanks.

## 2013-09-15 NOTE — Telephone Encounter (Signed)
Patient advised.

## 2013-09-23 ENCOUNTER — Other Ambulatory Visit: Payer: Self-pay | Admitting: Family Medicine

## 2013-09-23 DIAGNOSIS — E119 Type 2 diabetes mellitus without complications: Secondary | ICD-10-CM

## 2013-09-26 ENCOUNTER — Other Ambulatory Visit (INDEPENDENT_AMBULATORY_CARE_PROVIDER_SITE_OTHER): Payer: PRIVATE HEALTH INSURANCE

## 2013-09-26 DIAGNOSIS — E119 Type 2 diabetes mellitus without complications: Secondary | ICD-10-CM

## 2013-09-26 LAB — LIPID PANEL
CHOL/HDL RATIO: 5.4 ratio
Cholesterol: 168 mg/dL (ref 0–200)
HDL: 31 mg/dL — ABNORMAL LOW (ref >39)
LDL Cholesterol: 93 mg/dL (ref 0–99)
Triglycerides: 221 mg/dL — ABNORMAL HIGH (ref <150)
VLDL: 44 mg/dL — AB (ref 0–40)

## 2013-09-26 LAB — COMPREHENSIVE METABOLIC PANEL
ALT: 16 U/L (ref 0–53)
AST: 15 U/L (ref 0–37)
Albumin: 4.2 g/dL (ref 3.5–5.2)
Alkaline Phosphatase: 36 U/L — ABNORMAL LOW (ref 39–117)
BILIRUBIN TOTAL: 0.3 mg/dL (ref 0.2–1.2)
BUN: 17 mg/dL (ref 6–23)
CO2: 30 meq/L (ref 19–32)
Calcium: 9.3 mg/dL (ref 8.4–10.5)
Chloride: 100 mEq/L (ref 96–112)
Creat: 1.17 mg/dL (ref 0.50–1.35)
Glucose, Bld: 181 mg/dL — ABNORMAL HIGH (ref 70–99)
Potassium: 5.4 mEq/L — ABNORMAL HIGH (ref 3.5–5.3)
Sodium: 139 mEq/L (ref 135–145)
Total Protein: 6.6 g/dL (ref 6.0–8.3)

## 2013-09-27 LAB — HEMOGLOBIN A1C
Hgb A1c MFr Bld: 9.7 % — ABNORMAL HIGH (ref <5.7)
MEAN PLASMA GLUCOSE: 232 mg/dL — AB (ref <117)

## 2013-10-03 ENCOUNTER — Encounter: Payer: Self-pay | Admitting: Family Medicine

## 2013-10-03 ENCOUNTER — Ambulatory Visit (INDEPENDENT_AMBULATORY_CARE_PROVIDER_SITE_OTHER): Payer: PRIVATE HEALTH INSURANCE | Admitting: Family Medicine

## 2013-10-03 VITALS — BP 150/84 | HR 92 | Temp 98.0°F | Ht 70.0 in | Wt 259.5 lb

## 2013-10-03 DIAGNOSIS — E669 Obesity, unspecified: Secondary | ICD-10-CM

## 2013-10-03 DIAGNOSIS — N529 Male erectile dysfunction, unspecified: Secondary | ICD-10-CM

## 2013-10-03 DIAGNOSIS — L821 Other seborrheic keratosis: Secondary | ICD-10-CM

## 2013-10-03 DIAGNOSIS — M25569 Pain in unspecified knee: Secondary | ICD-10-CM

## 2013-10-03 DIAGNOSIS — N521 Erectile dysfunction due to diseases classified elsewhere: Secondary | ICD-10-CM

## 2013-10-03 DIAGNOSIS — F17200 Nicotine dependence, unspecified, uncomplicated: Secondary | ICD-10-CM

## 2013-10-03 DIAGNOSIS — K259 Gastric ulcer, unspecified as acute or chronic, without hemorrhage or perforation: Secondary | ICD-10-CM

## 2013-10-03 DIAGNOSIS — E119 Type 2 diabetes mellitus without complications: Secondary | ICD-10-CM

## 2013-10-03 DIAGNOSIS — I2510 Atherosclerotic heart disease of native coronary artery without angina pectoris: Secondary | ICD-10-CM

## 2013-10-03 DIAGNOSIS — E1169 Type 2 diabetes mellitus with other specified complication: Secondary | ICD-10-CM

## 2013-10-03 LAB — MICROALBUMIN / CREATININE URINE RATIO
Creatinine,U: 118.6 mg/dL
MICROALB/CREAT RATIO: 3.6 mg/g (ref 0.0–30.0)
Microalb, Ur: 4.3 mg/dL — ABNORMAL HIGH (ref 0.0–1.9)

## 2013-10-03 MED ORDER — TADALAFIL 20 MG PO TABS: 10.0000 mg | ORAL_TABLET | ORAL | Status: DC | PRN

## 2013-10-03 NOTE — Patient Instructions (Signed)
Go to the lab on the way out.  We'll contact you with your lab report. Cut back on sugars and work on your weight.  Recheck A1c in 3 months before another visit.  Call about the cardiology and ophthalmology follow up appointments. The skin spot looks like a benign seborrheic keratosis.   Try the cialis in the meantime.  Cut back smoking.  Take care.

## 2013-10-03 NOTE — Assessment & Plan Note (Signed)
Not inflamed, reassured.

## 2013-10-03 NOTE — Assessment & Plan Note (Signed)
Encouraged attention to diet and gradual weight loss.

## 2013-10-03 NOTE — Assessment & Plan Note (Signed)
Encouraged taper then cessation.

## 2013-10-03 NOTE — Assessment & Plan Note (Signed)
Uncontrolled, admitted diet "was garbage" last few months.  Trial with current meds, attention to diet.  Recheck in 3 months.  MALB on the way out today.  I didn't alter his BP meds yet.

## 2013-10-03 NOTE — Assessment & Plan Note (Signed)
Ortho f/u pending.

## 2013-10-03 NOTE — Assessment & Plan Note (Signed)
Discussed smoking, DM2 control.  Trial of cialis for now.  He agrees.

## 2013-10-03 NOTE — Assessment & Plan Note (Addendum)
Continue PPI qd for now.  He is on plavix, off ASA for now.  I'll defer ASA to cards.

## 2013-10-03 NOTE — Assessment & Plan Note (Signed)
No CP, he'll call about f/u with cards.

## 2013-10-03 NOTE — Progress Notes (Signed)
Pre visit review using our clinic review tool, if applicable. No additional management support is needed unless otherwise documented below in the visit note.  CPE was tabled due to other concerns.  See below.   Diabetes:  Using medications without difficulties: yes Hypoglycemic episodes: no sx Hyperglycemic episodes: no sx Feet problems:no Blood Sugars averaging: not checked eye exam within last year: due, encouraged.  He didn't work on diet.  He did continue his medicine.  A1c up.  D/w pt.    ED.   Couldn't get coverage for viagra.  Discussed options.   H/o CAD.  Due for cards f/u.  Discussed, encouraged smoking less, then quitting.  No CP, not SOB, no BLE edema. He is going to f/u with cards soon.    Skin lesion on back.  Wanted eval.   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  I encouraged him.    His knee pain (L) continues.  Has ortho f/u pending for f/u injection.    PMH and SH reviewed  Meds, vitals, and allergies reviewed.   ROS: See HPI.  Otherwise negative.    GEN: nad, alert and oriented HEENT: mucous membranes moist NECK: supple w/o LA CV: rrr. PULM: ctab, no inc wob ABD: soft, +bs EXT: no edema SKIN: no acute rash but small benign appearing SK noted on the upper mid back.   Diabetic foot exam: Normal inspection No skin breakdown No calluses  Normal DP pulses Normal sensation to light touch and monofilament Nails normal

## 2013-10-04 ENCOUNTER — Telehealth: Payer: Self-pay | Admitting: Family Medicine

## 2013-10-04 ENCOUNTER — Other Ambulatory Visit: Payer: Self-pay | Admitting: Family Medicine

## 2013-10-04 DIAGNOSIS — E119 Type 2 diabetes mellitus without complications: Secondary | ICD-10-CM

## 2013-10-04 MED ORDER — LOSARTAN POTASSIUM 25 MG PO TABS: 25.0000 mg | ORAL_TABLET | Freq: Every day | ORAL | Status: DC

## 2013-10-04 NOTE — Telephone Encounter (Signed)
Relevant patient education assigned to patient using Emmi. ° °

## 2013-10-05 ENCOUNTER — Telehealth: Payer: Self-pay

## 2013-10-05 NOTE — Telephone Encounter (Signed)
Relevant patient education assigned to patient using Emmi. ° °

## 2013-10-17 ENCOUNTER — Other Ambulatory Visit: Payer: Self-pay | Admitting: Family Medicine

## 2013-10-18 ENCOUNTER — Telehealth: Payer: Self-pay | Admitting: Family Medicine

## 2013-10-18 ENCOUNTER — Ambulatory Visit: Payer: PRIVATE HEALTH INSURANCE

## 2013-10-18 ENCOUNTER — Other Ambulatory Visit (INDEPENDENT_AMBULATORY_CARE_PROVIDER_SITE_OTHER): Payer: PRIVATE HEALTH INSURANCE

## 2013-10-18 DIAGNOSIS — E119 Type 2 diabetes mellitus without complications: Secondary | ICD-10-CM

## 2013-10-18 NOTE — Telephone Encounter (Signed)
Pt says that his glucose meter is malfunctioning and he is requesting RX for new meter.

## 2013-10-18 NOTE — Telephone Encounter (Signed)
rx hand written for meter.  Thanks .

## 2013-10-18 NOTE — Telephone Encounter (Signed)
[name]

## 2013-10-19 LAB — BASIC METABOLIC PANEL
BUN/Creatinine Ratio: 12 (ref 10–22)
BUN: 15 mg/dL (ref 8–27)
CALCIUM: 9.6 mg/dL (ref 8.6–10.2)
CO2: 27 mmol/L (ref 18–29)
CREATININE: 1.22 mg/dL (ref 0.76–1.27)
Chloride: 96 mmol/L — ABNORMAL LOW (ref 97–108)
GFR calc Af Amer: 72 mL/min/{1.73_m2} (ref >59)
GFR calc non Af Amer: 62 mL/min/{1.73_m2} (ref >59)
Glucose: 213 mg/dL — ABNORMAL HIGH (ref 65–99)
Potassium: 5.4 mmol/L — ABNORMAL HIGH (ref 3.5–5.2)
SODIUM: 138 mmol/L (ref 134–144)

## 2013-10-20 ENCOUNTER — Other Ambulatory Visit: Payer: Self-pay | Admitting: Family Medicine

## 2013-10-20 DIAGNOSIS — E875 Hyperkalemia: Secondary | ICD-10-CM

## 2013-10-20 MED ORDER — SODIUM POLYSTYRENE SULFONATE 15 GM/60ML PO SUSP: 15.0000 g | Freq: Every day | ORAL | Status: DC

## 2013-10-26 ENCOUNTER — Other Ambulatory Visit (INDEPENDENT_AMBULATORY_CARE_PROVIDER_SITE_OTHER): Payer: PRIVATE HEALTH INSURANCE

## 2013-10-26 ENCOUNTER — Other Ambulatory Visit: Payer: Self-pay | Admitting: *Deleted

## 2013-10-26 DIAGNOSIS — E875 Hyperkalemia: Secondary | ICD-10-CM

## 2013-10-26 MED ORDER — GLUCOSE BLOOD VI STRP: ORAL_STRIP | Status: DC

## 2013-10-26 NOTE — Telephone Encounter (Signed)
Received faxed refill request from pharmacy. Refill sent to pharmacy electronically. 

## 2013-10-27 ENCOUNTER — Other Ambulatory Visit: Payer: Self-pay

## 2013-10-27 LAB — BASIC METABOLIC PANEL
BUN/Creatinine Ratio: 13 (ref 10–22)
BUN: 15 mg/dL (ref 8–27)
CHLORIDE: 96 mmol/L — AB (ref 97–108)
CO2: 27 mmol/L (ref 18–29)
Calcium: 9.4 mg/dL (ref 8.6–10.2)
Creatinine, Ser: 1.18 mg/dL (ref 0.76–1.27)
GFR calc Af Amer: 75 mL/min/{1.73_m2} (ref >59)
GFR calc non Af Amer: 65 mL/min/{1.73_m2} (ref >59)
GLUCOSE: 213 mg/dL — AB (ref 65–99)
Potassium: 5.3 mmol/L — ABNORMAL HIGH (ref 3.5–5.2)
Sodium: 138 mmol/L (ref 134–144)

## 2013-10-27 MED ORDER — GLUCOSE BLOOD VI STRP: ORAL_STRIP | Status: DC

## 2013-10-27 NOTE — Telephone Encounter (Signed)
Joshua Roy with [name] request more detailed instruction for use of test strips; needs how often pt should ck BS. In last Office note said pt was not checking BS. How ofter does [name] want pt to ck BS.

## 2013-10-27 NOTE — Telephone Encounter (Signed)
Sent. Clarified.

## 2013-11-14 ENCOUNTER — Encounter: Payer: Self-pay | Admitting: Gastroenterology

## 2013-11-17 ENCOUNTER — Other Ambulatory Visit: Payer: Self-pay | Admitting: Family Medicine

## 2013-12-20 ENCOUNTER — Other Ambulatory Visit: Payer: Self-pay | Admitting: Family Medicine

## 2013-12-20 NOTE — Telephone Encounter (Signed)
Electronic refill request. This medication is not on his current meds list.  Please advise.

## 2013-12-21 NOTE — Telephone Encounter (Signed)
It appears he was on it and it expired from his med list.  Sent.

## 2013-12-26 ENCOUNTER — Other Ambulatory Visit: Payer: Self-pay | Admitting: Family Medicine

## 2013-12-26 DIAGNOSIS — E119 Type 2 diabetes mellitus without complications: Secondary | ICD-10-CM

## 2014-01-02 ENCOUNTER — Other Ambulatory Visit (INDEPENDENT_AMBULATORY_CARE_PROVIDER_SITE_OTHER): Payer: No Typology Code available for payment source

## 2014-01-02 DIAGNOSIS — E119 Type 2 diabetes mellitus without complications: Secondary | ICD-10-CM

## 2014-01-03 LAB — HEMOGLOBIN A1C
ESTIMATED AVERAGE GLUCOSE: 226 mg/dL
HEMOGLOBIN A1C: 9.5 % — AB (ref 4.8–5.6)

## 2014-01-05 ENCOUNTER — Ambulatory Visit (INDEPENDENT_AMBULATORY_CARE_PROVIDER_SITE_OTHER): Payer: No Typology Code available for payment source | Admitting: Family Medicine

## 2014-01-05 ENCOUNTER — Encounter (INDEPENDENT_AMBULATORY_CARE_PROVIDER_SITE_OTHER): Payer: Self-pay

## 2014-01-05 ENCOUNTER — Encounter: Payer: Self-pay | Admitting: Family Medicine

## 2014-01-05 VITALS — BP 142/76 | HR 72 | Temp 98.1°F | Wt 252.5 lb

## 2014-01-05 DIAGNOSIS — IMO0001 Reserved for inherently not codable concepts without codable children: Secondary | ICD-10-CM

## 2014-01-05 DIAGNOSIS — E119 Type 2 diabetes mellitus without complications: Secondary | ICD-10-CM

## 2014-01-05 DIAGNOSIS — E1165 Type 2 diabetes mellitus with hyperglycemia: Secondary | ICD-10-CM

## 2014-01-05 DIAGNOSIS — IMO0002 Reserved for concepts with insufficient information to code with codable children: Secondary | ICD-10-CM

## 2014-01-05 MED ORDER — INSULIN GLARGINE 100 UNIT/ML SOLOSTAR PEN: PEN_INJECTOR | SUBCUTANEOUS | Status: DC

## 2014-01-05 MED ORDER — INSULIN PEN NEEDLE 31G X 5 MM MISC: Status: DC

## 2014-01-05 NOTE — Patient Instructions (Signed)
Check to make sure your insurance will cover the diabetic teaching.  You'll give your first insulin shot at the class.  Rosaria Ferries will call about your referral. Recheck A1c in about 3 months.  Take care.

## 2014-01-05 NOTE — Progress Notes (Signed)
Pre visit review using our clinic review tool, if applicable. No additional management support is needed unless otherwise documented below in the visit note.  Diabetes:  Using medications without difficulties:yes  Hypoglycemic episodes:no Hyperglycemic episodes:no, see below Feet problems:no Blood Sugars averaging: 120-150s in AMs A1c not much improved. 9.7--> 9.5.   Work demand fell off recently.   BP is improved today.    Meds, vitals, and allergies reviewed.   ROS: See HPI.  Otherwise negative.    GEN: nad, alert and oriented HEENT: mucous membranes moist NECK: supple w/o LA CV: rrr. PULM: ctab, no inc wob ABD: soft, +bs EXT: no edema SKIN: no acute rash  Diabetic foot exam: Normal inspection No skin breakdown No calluses  Normal DP pulses Normal sensation to light touch and monofilament Nails normal

## 2014-01-06 NOTE — Assessment & Plan Note (Signed)
Needs about 40-50 lbs weight loss over the next ~2 years.  D/w pt.   Needs insulin start given his A1c.  Needs work on diet and more exercise. All d/w pt.  rx for lantus and needles given to patient.  Discussed technique.  Will gradually inc his insulin dose based on sugar readings.  Refer for DM2 teaching.  Recheck A1c in about 3 months.

## 2014-03-15 ENCOUNTER — Other Ambulatory Visit: Payer: Self-pay | Admitting: Family Medicine

## 2014-04-07 ENCOUNTER — Other Ambulatory Visit: Payer: Self-pay | Admitting: Family Medicine

## 2014-04-07 DIAGNOSIS — E1165 Type 2 diabetes mellitus with hyperglycemia: Principal | ICD-10-CM

## 2014-04-07 DIAGNOSIS — IMO0001 Reserved for inherently not codable concepts without codable children: Secondary | ICD-10-CM

## 2014-04-09 ENCOUNTER — Other Ambulatory Visit: Payer: Self-pay | Admitting: Family Medicine

## 2014-04-09 DIAGNOSIS — IMO0002 Reserved for concepts with insufficient information to code with codable children: Secondary | ICD-10-CM

## 2014-04-09 DIAGNOSIS — E1165 Type 2 diabetes mellitus with hyperglycemia: Secondary | ICD-10-CM

## 2014-04-10 ENCOUNTER — Other Ambulatory Visit (INDEPENDENT_AMBULATORY_CARE_PROVIDER_SITE_OTHER): Payer: No Typology Code available for payment source

## 2014-04-10 DIAGNOSIS — IMO0001 Reserved for inherently not codable concepts without codable children: Secondary | ICD-10-CM

## 2014-04-10 DIAGNOSIS — E1165 Type 2 diabetes mellitus with hyperglycemia: Secondary | ICD-10-CM

## 2014-04-10 DIAGNOSIS — IMO0002 Reserved for concepts with insufficient information to code with codable children: Secondary | ICD-10-CM

## 2014-04-10 NOTE — Addendum Note (Signed)
Addended by: Ellamae Sia on: 04/10/2014 09:07 AM   Modules accepted: Orders

## 2014-04-11 LAB — HEMOGLOBIN A1C
ESTIMATED AVERAGE GLUCOSE: 200 mg/dL
Hgb A1c MFr Bld: 8.6 % — ABNORMAL HIGH (ref 4.8–5.6)

## 2014-04-13 ENCOUNTER — Ambulatory Visit (INDEPENDENT_AMBULATORY_CARE_PROVIDER_SITE_OTHER): Payer: No Typology Code available for payment source | Admitting: Family Medicine

## 2014-04-13 ENCOUNTER — Encounter: Payer: Self-pay | Admitting: Family Medicine

## 2014-04-13 VITALS — BP 140/86 | HR 70 | Temp 98.1°F | Wt 255.8 lb

## 2014-04-13 DIAGNOSIS — Z23 Encounter for immunization: Secondary | ICD-10-CM

## 2014-04-13 DIAGNOSIS — E1149 Type 2 diabetes mellitus with other diabetic neurological complication: Secondary | ICD-10-CM

## 2014-04-13 MED ORDER — GLIMEPIRIDE 2 MG PO TABS: ORAL_TABLET | ORAL | Status: DC

## 2014-04-13 MED ORDER — SILDENAFIL CITRATE 100 MG PO TABS: 50.0000 mg | ORAL_TABLET | Freq: Every day | ORAL | Status: DC | PRN

## 2014-04-13 NOTE — Patient Instructions (Addendum)
Thanks for your effort with your diet.  Check with your insurance to see if they will cover the shingles shot.   Prevnar pneumonia shot at 6.   When you get on medicare and change/set up a pharmacy, let us know and we'll send your meds.  Recheck in 07/2013.  Labs ahead of time.

## 2014-04-13 NOTE — Progress Notes (Signed)
Pre visit review using our clinic review tool, if applicable. No additional management support is needed unless otherwise documented below in the visit note.  DM2 He couldn't get the DM2 teaching covered, so he really got strict about his diet.  He cut his A1c down a point, d/w pt.  He didn't get started on insulin in the meantime.  He had been losing some weight on his scales at home.  Labs d/w pt.  A1c improved.  R medial 1st toe numb.  Eye exam f/u pending.   Meds, vitals, and allergies reviewed.   ROS: See HPI.  Otherwise, noncontributory.  GEN: nad, alert and oriented HEENT: mucous membranes moist NECK: supple w/o LA CV: rrr.  no murmur PULM: ctab, no inc wob ABD: soft, +bs EXT: no edema SKIN: no acute rash  Diabetic foot exam: Normal inspection No skin breakdown No calluses  Normal DP pulses Normal sensation to light touch and monofilament except for R medial 1st toe numb Nails normal

## 2014-04-14 NOTE — Assessment & Plan Note (Signed)
Now with strict diet, not on insulin. A1c improved, likely lags his progress.  D/w pt.  Continue meds as is for now.  Foot changes noted.  Recheck in about 3 months.  He agrees.

## 2014-05-28 LAB — HM DIABETES EYE EXAM

## 2014-06-13 ENCOUNTER — Other Ambulatory Visit: Payer: Self-pay | Admitting: Family Medicine

## 2014-06-26 ENCOUNTER — Other Ambulatory Visit: Payer: Self-pay

## 2014-06-26 NOTE — Telephone Encounter (Signed)
Pt left v/m; pt is starting on medicare 06/27/14; pt request printed rx for all meds; pt is going to start using a mail order pharmacy. Left v/m for pt to cb to get names of meds needed.

## 2014-06-30 NOTE — Telephone Encounter (Signed)
Pt request printed rx for metoformin, pantoprazole,plavix,metoprolol,glimepiride,losartan,simvastatin,fenofibrate and viagra. Pt going to establish with new mail order pharmacy. Call pt when ready for pick up.

## 2014-07-02 MED ORDER — PANTOPRAZOLE SODIUM 40 MG PO TBEC: 40.0000 mg | DELAYED_RELEASE_TABLET | Freq: Every day | ORAL | Status: DC

## 2014-07-02 MED ORDER — FENOFIBRATE 160 MG PO TABS: 160.0000 mg | ORAL_TABLET | Freq: Every day | ORAL | Status: DC

## 2014-07-02 MED ORDER — GLIMEPIRIDE 2 MG PO TABS: ORAL_TABLET | ORAL | Status: DC

## 2014-07-02 MED ORDER — SIMVASTATIN 80 MG PO TABS: ORAL_TABLET | ORAL | Status: DC

## 2014-07-02 MED ORDER — METFORMIN HCL 1000 MG PO TABS: 1000.0000 mg | ORAL_TABLET | Freq: Two times a day (BID) | ORAL | Status: DC

## 2014-07-02 MED ORDER — LOSARTAN POTASSIUM 25 MG PO TABS: 25.0000 mg | ORAL_TABLET | Freq: Every day | ORAL | Status: DC

## 2014-07-02 MED ORDER — CLOPIDOGREL BISULFATE 75 MG PO TABS: 75.0000 mg | ORAL_TABLET | Freq: Every day | ORAL | Status: DC

## 2014-07-02 MED ORDER — SILDENAFIL CITRATE 100 MG PO TABS: 50.0000 mg | ORAL_TABLET | Freq: Every day | ORAL | Status: DC | PRN

## 2014-07-02 MED ORDER — METOPROLOL TARTRATE 25 MG PO TABS: 25.0000 mg | ORAL_TABLET | Freq: Two times a day (BID) | ORAL | Status: DC

## 2014-07-02 NOTE — Telephone Encounter (Signed)
Printed.  Thanks.  

## 2014-07-03 NOTE — Telephone Encounter (Signed)
Patient notified by telephone that scripts are up front ready for pickup.

## 2014-08-07 ENCOUNTER — Other Ambulatory Visit: Payer: Self-pay | Admitting: Family Medicine

## 2014-08-07 DIAGNOSIS — E1149 Type 2 diabetes mellitus with other diabetic neurological complication: Secondary | ICD-10-CM

## 2014-08-09 ENCOUNTER — Other Ambulatory Visit (INDEPENDENT_AMBULATORY_CARE_PROVIDER_SITE_OTHER): Payer: Medicare Other

## 2014-08-09 DIAGNOSIS — E1149 Type 2 diabetes mellitus with other diabetic neurological complication: Secondary | ICD-10-CM

## 2014-08-09 DIAGNOSIS — E1140 Type 2 diabetes mellitus with diabetic neuropathy, unspecified: Secondary | ICD-10-CM

## 2014-08-09 LAB — HEMOGLOBIN A1C: Hgb A1c MFr Bld: 9.1 % — ABNORMAL HIGH (ref 4.6–6.5)

## 2014-08-16 ENCOUNTER — Ambulatory Visit (INDEPENDENT_AMBULATORY_CARE_PROVIDER_SITE_OTHER): Payer: Medicare Other | Admitting: Family Medicine

## 2014-08-16 ENCOUNTER — Encounter: Payer: Self-pay | Admitting: Family Medicine

## 2014-08-16 VITALS — BP 140/78 | HR 80 | Temp 98.4°F | Wt 255.5 lb

## 2014-08-16 DIAGNOSIS — E1140 Type 2 diabetes mellitus with diabetic neuropathy, unspecified: Secondary | ICD-10-CM

## 2014-08-16 DIAGNOSIS — E1149 Type 2 diabetes mellitus with other diabetic neurological complication: Secondary | ICD-10-CM

## 2014-08-16 MED ORDER — SILDENAFIL CITRATE 100 MG PO TABS: 50.0000 mg | ORAL_TABLET | Freq: Every day | ORAL | Status: DC | PRN

## 2014-08-16 NOTE — Progress Notes (Signed)
Pre visit review using our clinic review tool, if applicable. No additional management support is needed unless otherwise documented below in the visit note.  Diabetes: he was working on his diet and his weight isn't up.  A1c was up.   Using medications without difficulties: yes Hypoglycemic episodes:no sx Hyperglycemic episodes: no sx Feet problems: no Blood Sugars averaging: not checked often eye exam within last year: 1 month ago, no retinopathy.   A1c d/w pt.  Needs rx for a meter.  Done today.    Recent cold sx.  Some better recently.    He is going to see Dr. Maureen Ralphs about a knee injection, Synvisc.  He's checking on this in the meantime.    ED.  100mg  viagra worked for patient.  Needs a new rx.   PMH and SH reviewed  Meds, vitals, and allergies reviewed.   ROS: See HPI.  Otherwise negative.    GEN: nad, alert and oriented HEENT: mucous membranes moist NECK: supple w/o LA CV: rrr. PULM: ctab, no inc wob ABD: soft, +bs EXT: no edema SKIN: no acute rash  Diabetic foot exam: Normal inspection No skin breakdown No calluses  Normal DP pulses Normal sensation to light touch and monofilament except for numbness on R 1st toe.  Nails normal

## 2014-08-16 NOTE — Patient Instructions (Signed)
Recheck labs before a physical in about 3 months.   Check your sugar before and 2 hours after some meals to find your culprit foods and cut back on those.  Give me an update in a few weeks.   Take care.  Glad to see you.

## 2014-08-17 NOTE — Assessment & Plan Note (Signed)
A1c up.  D/w pt.  He'll get a meter and start checking sugar pre/post meals to find offending agents.  D/w pt about weight and diet and exercise.  Exercise limited by knee pain and he'll f/u with ortho about that.  No change in meds at this point.  He can update me with his sugars in the meantime, o/w recheck labs in about 3 months.  He agrees.  rx written for meter/lancets/strips. >25 minutes spent in face to face time with patient, >50% spent in counselling or coordination of care.

## 2014-09-06 DIAGNOSIS — M1711 Unilateral primary osteoarthritis, right knee: Secondary | ICD-10-CM | POA: Diagnosis not present

## 2014-09-06 DIAGNOSIS — M170 Bilateral primary osteoarthritis of knee: Secondary | ICD-10-CM | POA: Diagnosis not present

## 2014-09-06 DIAGNOSIS — M1712 Unilateral primary osteoarthritis, left knee: Secondary | ICD-10-CM | POA: Diagnosis not present

## 2014-09-13 DIAGNOSIS — M1712 Unilateral primary osteoarthritis, left knee: Secondary | ICD-10-CM | POA: Diagnosis not present

## 2014-09-13 DIAGNOSIS — M1711 Unilateral primary osteoarthritis, right knee: Secondary | ICD-10-CM | POA: Diagnosis not present

## 2014-09-20 DIAGNOSIS — M1712 Unilateral primary osteoarthritis, left knee: Secondary | ICD-10-CM | POA: Diagnosis not present

## 2014-09-20 DIAGNOSIS — M1711 Unilateral primary osteoarthritis, right knee: Secondary | ICD-10-CM | POA: Diagnosis not present

## 2014-10-02 ENCOUNTER — Encounter: Payer: Self-pay | Admitting: Cardiovascular Disease

## 2014-10-02 ENCOUNTER — Ambulatory Visit (INDEPENDENT_AMBULATORY_CARE_PROVIDER_SITE_OTHER): Payer: Medicare Other | Admitting: Cardiovascular Disease

## 2014-10-02 VITALS — BP 164/92 | HR 75 | Ht 70.0 in | Wt 257.5 lb

## 2014-10-02 DIAGNOSIS — I10 Essential (primary) hypertension: Secondary | ICD-10-CM | POA: Diagnosis not present

## 2014-10-02 DIAGNOSIS — R0602 Shortness of breath: Secondary | ICD-10-CM

## 2014-10-02 DIAGNOSIS — E1149 Type 2 diabetes mellitus with other diabetic neurological complication: Secondary | ICD-10-CM

## 2014-10-02 DIAGNOSIS — I2510 Atherosclerotic heart disease of native coronary artery without angina pectoris: Secondary | ICD-10-CM | POA: Diagnosis not present

## 2014-10-02 DIAGNOSIS — J439 Emphysema, unspecified: Secondary | ICD-10-CM | POA: Diagnosis not present

## 2014-10-02 DIAGNOSIS — E1140 Type 2 diabetes mellitus with diabetic neuropathy, unspecified: Secondary | ICD-10-CM | POA: Diagnosis not present

## 2014-10-02 DIAGNOSIS — E785 Hyperlipidemia, unspecified: Secondary | ICD-10-CM

## 2014-10-02 NOTE — Patient Instructions (Addendum)
You are doing well. No medication changes were made.  Please call us if you have new issues that need to be addressed before your next appt.  Your physician wants you to follow-up in: 6 months.  You will receive a reminder letter in the mail two months in advance. If you don't receive a letter, please call our office to schedule the follow-up appointment.  PLEASE CALL THE OFFICE IF YOU WOULD LIKE TO SCHEDULE A STRESS TEST:  [place]  Your caregiver has ordered a Stress Test with nuclear imaging. The purpose of this test is to evaluate the blood supply to your heart muscle. This procedure is referred to as a "Non-Invasive Stress Test." This is because other than having an IV started in your vein, nothing is inserted or "invades" your body. Cardiac stress tests are done to find areas of poor blood flow to the heart by determining the extent of coronary artery disease (CAD). Some patients exercise on a treadmill, which naturally increases the blood flow to your heart, while others who are  unable to walk on a treadmill due to physical limitations have a pharmacologic/chemical stress agent called Lexiscan . This medicine will mimic walking on a treadmill by temporarily increasing your coronary blood flow.   Please note: these test may take anywhere between 2-4 hours to complete  PLEASE REPORT TO [place] AT THE FIRST DESK WILL DIRECT YOU WHERE TO GO  Date of Procedure:_____________________________________  Arrival Time for Procedure:______________________________  Instructions regarding medication:   __X__ : Hold diabetes medication the night before or morning of procedure:  METFORMIN   PLEASE NOTIFY THE OFFICE AT LEAST 24 HOURS IN ADVANCE IF YOU ARE UNABLE TO KEEP YOUR APPOINTMENT.  [phone] AND  PLEASE NOTIFY NUCLEAR MEDICINE AT [facility] AT LEAST 24 HOURS IN ADVANCE IF YOU ARE UNABLE TO KEEP YOUR APPOINTMENT. [phone]  How to prepare for your  Myoview test:  1. Do not eat or drink after midnight 2. No caffeine for 24 hours prior to test 3. No smoking 24 hours prior to test. 4. Your medication may be taken with water.  If your doctor stopped a medication because of this test, do not take that medication. 5. Ladies, please do not wear dresses.  Skirts or pants are appropriate. Please wear a short sleeve shirt. 6. No perfume, cologne or lotion.

## 2014-10-02 NOTE — Assessment & Plan Note (Signed)
No recent lipid panel. He will follow-up with primary care for repeat lab work If LDL above goal of less than 70, could consider simvastatin 80 mg or Lipitor 80 mg

## 2014-10-02 NOTE — Assessment & Plan Note (Signed)
Reports blood pressures better controlled at home. Not interested in additional medications

## 2014-10-02 NOTE — Assessment & Plan Note (Signed)
We have encouraged continued exercise, careful diet management in an effort to lose weight. 

## 2014-10-02 NOTE — Assessment & Plan Note (Addendum)
Very high risk of worsening coronary artery disease given poorly controlled diabetes, blood pressure, continued smoking He is requesting a stress test that needs to "think about it" as he is uncertain if he can stop smoking for 24 hours prior to the test which is a requirement We will wait to hear back from him

## 2014-10-02 NOTE — Assessment & Plan Note (Signed)
Discussed watching his diet, increasing exercise in an effort to lose weight

## 2014-10-02 NOTE — Progress Notes (Signed)
Patient ID: Joshua Roy, male    DOB: January 05, 1951, 64 y.o.   MRN: [place]:[id number]  HPI Comments: Joshua Roy is 64 year old gentleman with a history of coronary artery disease, occluded mid to distal RCA  by cardiac catheterization in 2001, residual 60% proximal to mid LAD disease at that time, 30% diagonal disease, 10% left main disease, 20% proximal left circumflex disease. He has a long history of smoking and continues to smoke at least 1 1/2 pack per day. He has hyperlipidemia, obesity and diabetes (poorly controlled). He presents today for follow-up of his coronary artery disease  He reports having worsening shortness of breath at times. Not very active at baseline Previously had difficulty with his medications as he did not have insurance. Now has Medicare Requesting a stress test but does not think he can stop smoking 24 hours prior to the test (test requirement) Denies any significant chest pain, only rare shortness of breath episodes He does not want insulin for his diabetes. Does not watch his weight, only recently started monitoring his diet  Other past medical history His last stress test was 2009 when he had a small fixed inferior perfusion defect, ejection fraction 51%  EKG today  shows normal sinus rhythm with diffuse T-wave abnormality in the anterior lateral leads, anterior leads    Allergies  Allergen Reactions  . Mucinex D [Pseudoephedrine-Guaifenesin Er]     Elevated BP, insomnia  . Ramipril     REACTION: Cough    Outpatient Encounter Prescriptions as of 10/02/2014  Medication Sig  . clopidogrel (PLAVIX) 75 MG tablet Take 1 tablet (75 mg total) by mouth daily.  . fenofibrate 160 MG tablet Take 1 tablet (160 mg total) by mouth daily.  Marland Kitchen glimepiride (AMARYL) 2 MG tablet Take one half tablet (1 mg) by mouth each morning  . glucose blood (ACCU-CHEK AVIVA PLUS) test strip Use as instructed to check sugar, dx 250.00  . losartan (COZAAR) 25 MG tablet Take 1 tablet  (25 mg total) by mouth daily.  . metFORMIN (GLUCOPHAGE) 1000 MG tablet Take 1 tablet (1,000 mg total) by mouth 2 (two) times daily.  . metoprolol tartrate (LOPRESSOR) 25 MG tablet Take 1 tablet (25 mg total) by mouth 2 (two) times daily.  . Multiple Vitamins-Minerals (MULTIVITAMIN WITH MINERALS) tablet Take 1 tablet by mouth daily.    . Omega-3 Fatty Acids (FISH OIL) 1000 MG CAPS Take by mouth 2 (two) times daily.    . pantoprazole (PROTONIX) 40 MG tablet Take 1 tablet (40 mg total) by mouth daily.  . sildenafil (VIAGRA) 100 MG tablet Take 0.5-1 tablets (50-100 mg total) by mouth daily as needed for erectile dysfunction.  . simvastatin (ZOCOR) 80 MG tablet TAKE 1/2 TABLET BY MOUTH EVERY EVENING AT BEDTIME  . [DISCONTINUED] Chlorphen-Phenyleph-ASA (ALKA-SELTZER PLUS COLD PO) Take by mouth as needed.  . [DISCONTINUED] benzonatate (TESSALON) 200 MG capsule Take 200 mg by mouth 3 (three) times daily as needed for cough.  . [DISCONTINUED] guaiFENesin (MUCINEX) 600 MG 12 hr tablet Take by mouth 2 (two) times daily.    Past Medical History  Diagnosis Date  . Anxiety   . COPD (chronic obstructive pulmonary disease)     cath, mild inf. hypokinesis EF 49%, 100% ROA  . Hyperlipidemia   . Hypertension   . ACE-inhibitor cough   . Diabetes mellitus type II   . OA (osteoarthritis)     knee OA, injected 2012 by ortho  . Coronary artery disease 2001  . Coronary  atherosclerosis     Past Surgical History  Procedure Laterality Date  . Knee surgery  1969    right, open surgery- repair  . Knee arthroscopy  07/25/04    left  . Cardiac catheterization  05/06/2000    @ [facility]    Social History  reports that he has been smoking.  He has never used smokeless tobacco. He reports that he drinks alcohol. He reports that he does not use illicit drugs.  Family History family history includes Alcohol abuse in his father; Cancer in his brother; Diabetes in his sister; Heart disease in his father and sister;  Hypertension in his father. There is no history of Colon cancer or Prostate cancer.   Review of Systems  HENT: Negative.   Eyes: Negative.   Respiratory: Positive for shortness of breath.   Cardiovascular: Negative.   Gastrointestinal: Negative.   Musculoskeletal: Negative.   Skin: Negative.   Neurological: Negative.   Psychiatric/Behavioral: Negative.   All other systems reviewed and are negative.   BP 164/92 mmHg  Pulse 75  Ht 5\' 10"  (1.778 m)  Wt 257 lb 8 oz (116.801 kg)  BMI 36.95 kg/m2  Physical Exam  Constitutional: He is oriented to person, place, and time. He appears well-developed and well-nourished.  obese  HENT:  Head: Normocephalic.  Nose: Nose normal.  Mouth/Throat: Oropharynx is clear and moist.  Eyes: Conjunctivae are normal. Pupils are equal, round, and reactive to light.  Neck: Normal range of motion. Neck supple. No JVD present.  Cardiovascular: Normal rate, regular rhythm, S1 normal, S2 normal, normal heart sounds and intact distal pulses.  Exam reveals no gallop and no friction rub.   No murmur heard. Pulmonary/Chest: Effort normal and breath sounds normal. No respiratory distress. He has no wheezes. He has no rales. He exhibits no tenderness.  Abdominal: Soft. Bowel sounds are normal. He exhibits no distension. There is no tenderness.  Musculoskeletal: Normal range of motion. He exhibits no edema or tenderness.  Lymphadenopathy:    He has no cervical adenopathy.  Neurological: He is alert and oriented to person, place, and time. Coordination normal.  Skin: Skin is warm and dry. No rash noted. No erythema.  Psychiatric: He has a normal mood and affect. His behavior is normal. Judgment and thought content normal.      Assessment and Plan   Nursing note and vitals reviewed.

## 2014-10-02 NOTE — Assessment & Plan Note (Signed)
Continues to smoke 1.5 packs per day We have encouraged him to continue to work on weaning his cigarettes and smoking cessation. He will continue to work on this and does not want any assistance with chantix.

## 2014-10-02 NOTE — Assessment & Plan Note (Signed)
Very mild shortness of breath. We have offered stress testing as above. Recommended smoking cessation. Likely underlying COPD

## 2014-11-06 ENCOUNTER — Other Ambulatory Visit: Payer: Self-pay | Admitting: Family Medicine

## 2014-11-06 NOTE — Telephone Encounter (Signed)
Pt started Medicare 07/2014 and has been getting med from [facility] mail order; pt has spoken with [facility] and they are sending pts meds but pt request 7 day refill of plavix and losartan to [facility]. Pt will ck to see if any other meds are needed and if so will ck with [facility] to see if refills are available.

## 2014-11-14 ENCOUNTER — Other Ambulatory Visit: Payer: Self-pay | Admitting: Family Medicine

## 2014-11-14 DIAGNOSIS — E1149 Type 2 diabetes mellitus with other diabetic neurological complication: Secondary | ICD-10-CM

## 2014-11-15 ENCOUNTER — Other Ambulatory Visit (INDEPENDENT_AMBULATORY_CARE_PROVIDER_SITE_OTHER): Payer: Medicare Other

## 2014-11-15 DIAGNOSIS — E1140 Type 2 diabetes mellitus with diabetic neuropathy, unspecified: Secondary | ICD-10-CM

## 2014-11-15 DIAGNOSIS — E1149 Type 2 diabetes mellitus with other diabetic neurological complication: Secondary | ICD-10-CM

## 2014-11-15 LAB — HEMOGLOBIN A1C: HEMOGLOBIN A1C: 8.5 % — AB (ref 4.6–6.5)

## 2014-11-15 LAB — COMPREHENSIVE METABOLIC PANEL
ALK PHOS: 48 U/L (ref 39–117)
ALT: 27 U/L (ref 0–53)
AST: 19 U/L (ref 0–37)
Albumin: 4.1 g/dL (ref 3.5–5.2)
BUN: 17 mg/dL (ref 6–23)
CALCIUM: 9.4 mg/dL (ref 8.4–10.5)
CO2: 30 mEq/L (ref 19–32)
Chloride: 101 mEq/L (ref 96–112)
Creatinine, Ser: 1.18 mg/dL (ref 0.40–1.50)
GFR: 65.78 mL/min (ref >60.00)
Glucose, Bld: 160 mg/dL — ABNORMAL HIGH (ref 70–99)
Potassium: 4.6 mEq/L (ref 3.5–5.1)
SODIUM: 137 meq/L (ref 135–145)
TOTAL PROTEIN: 6.5 g/dL (ref 6.0–8.3)
Total Bilirubin: 0.4 mg/dL (ref 0.2–1.2)

## 2014-11-15 LAB — LIPID PANEL
CHOLESTEROL: 155 mg/dL (ref 0–200)
HDL: 35 mg/dL — AB (ref >39.00)
NonHDL: 120
Total CHOL/HDL Ratio: 4
Triglycerides: 204 mg/dL — ABNORMAL HIGH (ref 0.0–149.0)
VLDL: 40.8 mg/dL — ABNORMAL HIGH (ref 0.0–40.0)

## 2014-11-15 LAB — LDL CHOLESTEROL, DIRECT: LDL DIRECT: 109 mg/dL

## 2014-11-16 ENCOUNTER — Other Ambulatory Visit: Payer: Medicare Other

## 2014-11-17 NOTE — Consult Note (Signed)
PATIENT NAME:  Joshua Roy, Joshua Roy MR#:  [id number] DATE OF BIRTH:  12-Aug-1950  DATE OF CONSULTATION:  05/13/2013  REFERRING PHYSICIAN:  Dr. Bridgette Habermann.  CONSULTING PHYSICIAN:  Lollie Sails, MD  REASON FOR CONSULTATION: Melena, hematemesis,   HISTORY OF PRESENT ILLNESS: Joshua Roy is a 64 year old male who came to the Emergency Room earlier today with problems of coffee-ground-type emesis and black stools. He states that he had an episode of coffee-ground-colored emesis yesterday at about 4 p.m. That has not been recurrent. He did have some dizziness yesterday. Further, he reports multiple black stools at home, the first yesterday morning, the last this morning. He states he had never had anything like this previously. He had no antecedent nausea or vomiting. His appetite has been good. He has had no weight loss. There has been no heartburn. There is some occasional discomfort in the distal esophagus when swallowing. He has been taking an 81 mg aspirin as well as daily Plavix for at least 10 years. Further, he has been taking some Advil for his knees and arthritic problems, at least 600 mg several times a week. He has no previous history of peptic ulcer disease. He has never had an upper scope done. He did have a colonoscopy he states 2 to 3 years ago. Was told he had some polyps and had to have a 5-year followup. That was not done at this institution. I do not have those records. Currently, he states he is feeling okay. He was quite restless and wanting to perhaps go home.   GASTROINTESTINAL FAMILY HISTORY: Negative for colorectal cancer, liver disease or ulcers.   PAST MEDICAL HISTORY: He has a history of noninsulin-dependent diabetes, history of myocardial infarction. History of hypercholesterolemia as well as hypertension. He has had knee surgeries x 2. He denies other surgeries.   SOCIAL HISTORY: He will drink multiple alcoholic beverages over the course of a weekend. He smokes about 1/2  to 1 pack of cigarettes daily. He does not use any illicit drugs.   OUTPATIENT MEDICATIONS: Include acetaminophen/hydrocodone 300/7.5 mg, aspirin 81 mg once a day, clopidogrel 75 mg once a day, cyclobenzaprine 10 mg a day, fenofibrate 160 mg once a day, fish oil 1000 mg once a day, glimepiride 2 mg once a day, metformin 1000 mg twice a day, metoprolol 25 mg twice a day and simvastatin 80 mg 1/2 tablet once a day. He does state that he will occasionally take Advil and go immediately to bed. He also occasionally mixes the Advil with alcohol on the weekends.   ALLERGIES: He is allergic to ALTACE.    PHYSICAL EXAMINATION:  VITAL SIGNS: Temperature is 98.4, pulse 67, respirations 18, blood pressure 143/78, pulse ox 96%.  GENERAL: He is a well-appearing, 64 year old, Caucasian male in no acute distress.  HEENT: Normocephalic, atraumatic. EYES: Anicteric. NOSE: Septum midline. OROPHARYNX: No lesions.  NECK: Supple. No JVD.  HEART: Regular rate and rhythm.  LUNGS: Clear.  ABDOMEN: Soft, protuberant, obese, nontender, nondistended. Bowel sounds positive, normoactive.  ANORECTAL: Deferred.  EXTREMITIES: No clubbing, cyanosis or edema.  NEUROLOGICAL: Cranial nerves II through XII grossly intact. Muscle strength bilaterally equal and symmetric.   LABORATORIES: Include the following: On admission to the hospital, he had a glucose of 169, BUN 42, creatinine 1.25, sodium 135, potassium 4.3, chloride 101, bicarb 30. Osmolality 284. Calcium 9.5. Lipase 202. He had a hepatic profile which was normal. Hemogram showed a white count of 10.8, H and H 13.9/40.5, platelet count of 271, MCV was  83. He had an 8 hour follow-up hemoglobin decreasing slightly to 12.1. He has had no bowel movements over the course of the day since this morning. He had a portable chest film done that showed no evidence of acute cardiopulmonary abnormality.   ASSESSMENT: Coffee-ground emesis and melena, several short-term episodes over the  course of the past 2 days. He has been hemodynamically stable today with no further emesis or bowel movement since this morning, no emesis since yesterday. He is currently on a Protonix drip. Serial hemoglobins are being done.   RECOMMENDATION: Will try to arrange EGD for tomorrow morning. I have discussed the risks, benefits and complications of EGD to include, not limited to, bleeding, infection, perforation and the risk of sedation, and he wishes to proceed. I have discussed that there is increased bleeding risk due to his recently taking Plavix, and indeed he took it the morning that he came to the hospital, that is this morning. Further recommendations to follow.   ____________________________ Lollie Sails, MD mus:gb D: 05/13/2013 21:47:22 ET T: 05/13/2013 21:57:26 ET JOB#: [id number]  cc: Lollie Sails, MD, <Dictator> Lollie Sails MD ELECTRONICALLY SIGNED 05/28/2013 15:40

## 2014-11-17 NOTE — Consult Note (Signed)
Chief Complaint:  Subjective/Chief Complaint seen for hematemesis, melena.  no further evidence of recurrent bleeding since admission.  no n/v or abdominal pain.   VITAL SIGNS/ANCILLARY NOTES: **Vital Signs.:   18-Oct-14 08:51  Vital Signs Type Q 4hr  Temperature Temperature (F) 98  Celsius 36.6  Temperature Source oral  Pulse Pulse 75  Respirations Respirations 20  Systolic BP Systolic BP 749  Diastolic BP (mmHg) Diastolic BP (mmHg) 77  Mean BP 95  Pulse Ox % Pulse Ox % 95  Pulse Ox Activity Level  At rest  Oxygen Delivery Room Air/ 21 %   Brief Assessment:  Cardiac Regular   Respiratory clear BS   Gastrointestinal details normal Soft  Nontender  Nondistended  Bowel sounds normal   Lab Results: Routine Chem:  18-Oct-14 02:52   BUN  34  Creatinine (comp) 1.22  Sodium, Serum 140  Potassium, Serum 3.9  Chloride, Serum 105  CO2, Serum 29  Calcium (Total), Serum 8.6  Anion Gap  6  Osmolality (calc) 288  eGFR (African American) >60  eGFR (Non-African American) >60 (eGFR values <37m/min/1.73 m2 may be an indication of chronic kidney disease (CKD). Calculated eGFR is useful in patients with stable renal function. The eGFR calculation will not be reliable in acutely ill patients when serum creatinine is changing rapidly. It is not useful in  patients on dialysis. The eGFR calculation may not be applicable to patients at the low and high extremes of body sizes, pregnant women, and vegetarians.)  Hemoglobin A1c ([facility])  7.9 (The American Diabetes Association recommends that a primary goal of therapy should be <7% and that physicians should reevaluate the treatment regimen in patients with HbA1c values consistently >8%.)  Cholesterol, Serum 113  Triglycerides, Serum  232  HDL (INHOUSE)  23  VLDL Cholesterol Calculated  46  LDL Cholesterol Calculated 44 (Result(s) reported on 14 May 2013 at 03:52AM.)  Routine Coag:  18-Oct-14 02:52   Prothrombin 13.8  INR 1.0 (INR  reference interval applies to patients on anticoagulant therapy. A single INR therapeutic range for coumarins is not optimal for all indications; however, the suggested range for most indications is 2.0 - 3.0. Exceptions to the INR Reference Range may include: Prosthetic heart valves, acute myocardial infarction, prevention of myocardial infarction, and combinations of aspirin and anticoagulant. The need for a higher or lower target INR must be assessed individually. Reference: The Pharmacology and Management of the Vitamin K  antagonists: the seventh ACCP Conference on Antithrombotic and Thrombolytic Therapy. CSWHQP.5916Sept:126 (3suppl): [id number] A HCT value >55% may artifactually increase the PT.  In one study,  the increase was an average of 25%. Reference:  "Effect on Routine and Special Coagulation Testing Values of Citrate Anticoagulant Adjustment in Patients with High HCT Values." American Journal of Clinical Pathology 2006;126:400-405.)  Routine Hem:  17-Oct-14 11:06   Hemoglobin (CBC) -  Hemoglobin (CBC) 13.9    19:47   Hemoglobin (CBC)  12.1 (Result(s) reported on 13 May 2013 at 08:01PM.)  18-Oct-14 02:52   WBC (CBC) 6.0  RBC (CBC)  4.03  Hemoglobin (CBC)  11.5  Hematocrit (CBC)  33.5  Platelet Count (CBC) 225  MCV 83  MCH 28.6  MCHC 34.4  RDW  14.6  Neutrophil % 45.4  Lymphocyte % 44.5  Monocyte % 7.0  Eosinophil % 2.2  Basophil % 0.9  Neutrophil # 2.7  Lymphocyte # 2.7  Monocyte # 0.4  Eosinophil # 0.1  Basophil # 0.1 (Result(s) reported on 14 May 2013  at 03:43AM.)   Assessment/Plan:  Assessment/Plan:  Assessment 1) hematemesis, melena-stable.  patietn on asa/plavix, taking moderate doses of IBP.  hemodynamically stable, mild drift of hgb.   Plan 1) for egd today.  I have discussed the risks benefits and complicatiosn of egd to include not limited to bleeding infection perforation and sedation and he wishes to proceed.  further recs to follow.    Electronic Signatures: Loistine Simas (MD)  (Signed 18-Oct-14 10:28)  Authored: Chief Complaint, VITAL SIGNS/ANCILLARY NOTES, Brief Assessment, Lab Results, Assessment/Plan   Last Updated: 18-Oct-14 10:28 by Loistine Simas (MD)

## 2014-11-17 NOTE — H&P (Signed)
PATIENT NAME:  Joshua Roy, BOXX MR#:  [id number] DATE OF BIRTH:  1950-12-04  DATE OF ADMISSION:  05/13/2013  REFERRING PHYSICIAN:  Dr. Karma Greaser.  PRIMARY CARE PHYSICIAN: Dr. Elsie Stain.   CHIEF COMPLAINT: Coffee-ground emesis, melena.   HISTORY OF PRESENT ILLNESS: The patient is pleasant 64 year old obese male with a history of MI and hypertension, diabetes, on aspirin and Plavix, who came in for likely GI bleed. The patient had an episode of dark vomitus coffee-ground yesterday and has had 2 dark stools yesterday and then 1 today. The patient has been having some epigastric discomfort without any pain or radiation.  He has had no appetite. Yesterday, ate some crackers, this morning some broth, but came into the hospital. He has no pains in the chest or dizziness. He had some epigastric pain a week ago or so, as well. Here, he was found to have possible upper GI bleed and hospitalist services were contacted for further evaluation and management.   PAST MEDICAL HISTORY: Hypertension, hyperlipidemia, history of MI, diabetes, history of knee surgeries x2.   ALLERGIES: ALTACE, CAUSING COUGH.   FAMILY HISTORY: Dad, heart disease and diabetes. A brother with lymphoma.   SOCIAL HISTORY: Smokes 1-1/2 packs a day, occasional alcohol over the weekends. No drugs.   OUTPATIENT MEDICATIONS: Aspirin 81 mg daily, Plavix 75 mg daily, metoprolol 25 mg daily, acetaminophen/hydrocodone 300/7.5 mg 1 tablet every 4 hours as needed for pain, cyclobenzaprine 10 mg 3 times a day as needed for spasms, fenofibrate 160 mg once a day, fish oil 1000 mg daily, glimepiride 2 mg daily, metformin 1000 mg 2 times a day, simvastatin 40 mg daily.   REVIEW OF SYSTEMS:  CONSTITUTIONAL: No fever. Positive for weakness and fatigue. No weight changes.  EYES: No blurry vision or double vision.  ENT: No tinnitus, hearing loss or snoring.  RESPIRATORY: No cough, wheezing, felt a little short of breath today. No COPD.   CARDIOVASCULAR: No chest pain, nor swelling in the legs. Has hypertension and history of MI. No palpitations.  GASTROINTESTINAL: No nausea, vomiting, diarrhea. Has loose stools. Has melena. No bright red blood per rectum or coughing up bright red blood. No rectal bleeding.  GENITOURINARY: Denies dysuria or hematuria. HEMATOLOGIC AND LYMPHATICS:  No anemia or  easy bruising.  SKIN: No rashes.  MUSCULOSKELETAL: Has chronic knee pain and arthritis.  NEUROLOGIC:  No focal weakness or numbness. Has global weakness.  PSYCHIATRIC: Denies anxiety or insomnia.   PHYSICAL EXAMINATION: VITAL SIGNS: Temperature on arrival was noted to be 97.6, pulse rate 79, respiratory rate 15, blood pressure 144/89, O2 sat 98% on room air.  GENERAL: The patient is a well-developed, obese male lying in bed, no obvious distress.  HEENT: Normocephalic, atraumatic. Pupils are equal and reactive. Anicteric sclerae. Extraocular muscles intact. Dark covering on tongue.    NECK: Supple. No thyroid tenderness. No cervical lymphadenopathy.  CARDIOVASCULAR: S1, S2, irregularly irregular. No murmurs, rubs or gallops.  LUNGS: Clear to auscultation without wheezing, rhonchi or rales.  ABDOMEN: Soft, nontender. Positive bowel sounds in all quadrants. No organomegaly appreciated. No rebound or guarding.  EXTREMITIES: No significant lower extremity edema.  SKIN: No obvious rashes.  NEUROLOGIC: Cranial nerves III through XII grossly intact. Strength is 5/5 in all extremities. Sensation is intact to light touch.  PSYCHIATRIC:  Awake, alert, oriented x 3.    LABORATORY DATA:  WBC 10.8, hemoglobin 13.9. Platelets are 271. LFTs shows alk phos  of 35, otherwise within normal limits. Lipase is 202. BUN is  42, creatinine 1.25, sodium 135, potassium 4.3. X-ray of the chest shows no evidence of acute cardiopulmonary abnormalities.   ASSESSMENT AND PLAN: We have a pleasant 64 year old obese male on aspirin and Plavix, history of diabetes,  hypertension, hyperlipidemia, who comes in with suspected gastrointestinal bleed, upper likely in nature. The patient has been having melena, dark stools, is a little short of breath. At this point, we will admit the patient to the hospital, obtain a GI consult, start him on gentle fluids and cycle hemoglobins. I have discussed the risks and benefits of a blood transfusion, and he has consented, but he is above the threshold for a transfusion at this point, but I suspect that if he is  having acute GI bleed, hemoglobin will trend down. I would hold aspirin and Plavix for now. I would continue Protonix drip at this point, put him on Zofran p.r.n. In regards to his diabetes, I will  hold his oral medications, as he is going to be n.p.o. except meds, and we will put him on sliding scale insulin. Continue his beta blocker at this point, but hold other nonessential medications. I would continue simvastatin. For DVT prophylaxis, he will  be on SCDs and TEDs.   He is FULL CODE.   Total Time Spent: 50 minutes.    ____________________________ Vivien Presto, MD sa:dmm D: 05/13/2013 13:02:42 ET T: 05/13/2013 13:18:20 ET JOB#: [id number]  cc: Vivien Presto, MD, <Dictator> Elveria Rising. Damita Dunnings, MD Vivien Presto MD ELECTRONICALLY SIGNED 05/27/2013 14:53

## 2014-11-17 NOTE — Consult Note (Signed)
Brief Consult Note: Diagnosis: coffeeground emesis, melena.   Patient was seen by consultant.   Consult note dictated.   Recommend to proceed with surgery or procedure.   Orders entered.   Discussed with Attending MD.   Comments: Please see full GI consult [phone].  Patietn admitted with coffeeground emesis and melena in the setting of daily asa and plavix use, with frequent nsaid use and occasional ETOH.  Continue iv ppi drip, will allow ice chips, npo otherwise, will try to arrange for egd with anesthesia assistance tomorrow, unsure of timing.  I have discussed the risks benefits and complicatiosn of egd to include not limited to bleeding infectionperforation and sedation and he wishes to proceed.  Electronic Signatures: Loistine Simas (MD)  (Signed 17-Oct-14 21:50)  Authored: Brief Consult Note   Last Updated: 17-Oct-14 21:50 by Loistine Simas (MD)

## 2014-11-17 NOTE — Discharge Summary (Signed)
PATIENT NAME:  Joshua, Roy MR#:  [id number] DATE OF BIRTH:  June 29, 1951  DATE OF ADMISSION:  05/13/2013 DATE OF DISCHARGE:  05/15/2013  PRESENTING COMPLAINT: Coffee-ground emesis.   DISCHARGE DIAGNOSES: 1. Upper gastrointestinal bleed secondary to acute gastritis, duodenitis.  2. Hypertension.  3. Type 2 diabetes.  4. Morbid obesity.   PROCEDURES: Upper GI endoscopy done by Dr. Gustavo Lah 10/18 showed erosive gastritis, normal esophagus, gastric ulcer with clean base. 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   CONDITION ON DISCHARGE: Fair. Hemoglobin is 10.8. White count is 6. Basic metabolic panel within normal limits. PT/INR is 13.8 and 1.0, hemoglobin A1c is 7.9.   GI CONSULTATION: Dr. Gustavo Lah.   MEDICATIONS AT DISCHARGE: 1. Protonix 40 mg b.i.d.  2. Acetaminophen/hydrocodone 7.5/300, 1 tablet every four hours as needed.  3. Metoprolol 25 mg b.i.d.  4. Glimepiride 2 mg p.o. daily.  5. Metformin 1000 mg b.i.d.  6. Fenofibrate 160 mg p.o. daily.  7. Simvastatin 80 mg 1/2 tablet at bedtime.  8. Fish oil 1000 mg p.o. daily.   DIET:  1. Low sodium,carbohydratecontrolled diet.  2. Full liquid diet for 3 to 4 days.   FOLLOWUP:  1. With Dr. Rockey Situ in 1 to 2 weeks.  2. With Dr. Gustavo Lah in 2 to 4 weeks.   BRIEF SUMMARY OF HOSPITAL COURSE: [name] is a 64 year old Caucasian gentleman with past medical history of hypertension and type 2 diabetes. Comes into the Emergency Room after he had coffee-ground emesis. He was admitted with:  1. Upper GI bleed. The patient was started on IV Protonix drip, seen by Dr. Gustavo Lah, recommended EGD that showed normal esophagus, erosive gastritis, gastric ulcer with clean base, along with acute on chronic duodenitis. The patient's hemoglobin remained stable. He was started on clear liquid diet, changed to a full liquid diet at discharge. The patient's aspirin and Plavix will be held at least for about a week. He will follow up with Dr. Rockey Situ and  resume  thereafter.  2. Type 2 diabetes and sliding scale insulin.  3. Hypertension. Resumed home meds.   Hospital stay otherwise remained stable.   CODE STATUS: THE PATIENT REMAINED A FULL CODE.   TIME SPENT: 40 minutes.    ____________________________ Hart Rochester Posey Pronto, MD sap:sg D: 05/16/2013 07:13:00 ET T: 05/16/2013 07:36:53 ET JOB#: [id number]  cc: Romi Rathel A. Posey Pronto, MD, <Dictator> Elveria Rising. Damita Dunnings, MD Minna Merritts, MD Lollie Sails, MD  Ilda Basset MD ELECTRONICALLY SIGNED 05/22/2013 10:47

## 2014-11-21 ENCOUNTER — Ambulatory Visit (INDEPENDENT_AMBULATORY_CARE_PROVIDER_SITE_OTHER): Payer: Medicare Other | Admitting: Family Medicine

## 2014-11-21 ENCOUNTER — Encounter: Payer: Self-pay | Admitting: Family Medicine

## 2014-11-21 VITALS — BP 164/88 | HR 78 | Temp 98.5°F | Ht 70.0 in | Wt 255.2 lb

## 2014-11-21 DIAGNOSIS — R454 Irritability and anger: Secondary | ICD-10-CM

## 2014-11-21 DIAGNOSIS — Z0000 Encounter for general adult medical examination without abnormal findings: Secondary | ICD-10-CM | POA: Diagnosis not present

## 2014-11-21 DIAGNOSIS — N521 Erectile dysfunction due to diseases classified elsewhere: Secondary | ICD-10-CM

## 2014-11-21 DIAGNOSIS — E785 Hyperlipidemia, unspecified: Secondary | ICD-10-CM

## 2014-11-21 DIAGNOSIS — E119 Type 2 diabetes mellitus without complications: Secondary | ICD-10-CM

## 2014-11-21 DIAGNOSIS — I2510 Atherosclerotic heart disease of native coronary artery without angina pectoris: Secondary | ICD-10-CM | POA: Diagnosis not present

## 2014-11-21 DIAGNOSIS — Z23 Encounter for immunization: Secondary | ICD-10-CM

## 2014-11-21 DIAGNOSIS — E1159 Type 2 diabetes mellitus with other circulatory complications: Secondary | ICD-10-CM

## 2014-11-21 DIAGNOSIS — Z7189 Other specified counseling: Secondary | ICD-10-CM

## 2014-11-21 DIAGNOSIS — I10 Essential (primary) hypertension: Secondary | ICD-10-CM

## 2014-11-21 DIAGNOSIS — IMO0002 Reserved for concepts with insufficient information to code with codable children: Secondary | ICD-10-CM

## 2014-11-21 DIAGNOSIS — E1165 Type 2 diabetes mellitus with hyperglycemia: Secondary | ICD-10-CM

## 2014-11-21 MED ORDER — SITAGLIPTIN PHOSPHATE 100 MG PO TABS: 100.0000 mg | ORAL_TABLET | Freq: Every day | ORAL | Status: DC

## 2014-11-21 MED ORDER — ATORVASTATIN CALCIUM 80 MG PO TABS: 80.0000 mg | ORAL_TABLET | Freq: Every day | ORAL | Status: DC

## 2014-11-21 MED ORDER — ALPRAZOLAM 0.25 MG PO TABS: 0.2500 mg | ORAL_TABLET | Freq: Two times a day (BID) | ORAL | Status: DC | PRN

## 2014-11-21 NOTE — Progress Notes (Signed)
Pre visit review using our clinic review tool, if applicable. No additional management support is needed unless otherwise documented below in the visit note.  I have personally reviewed the Medicare Annual Wellness questionnaire and have noted 1. The patient's medical and social history 2. Their use of alcohol, tobacco or illicit drugs 3. Their current medications and supplements 4. The patient's functional ability including ADL's, fall risks, home safety risks and hearing or visual             impairment. 5. Diet and physical activities 6. Evidence for depression or mood disorders  The patients weight, height, BMI have been recorded in the chart and visual acuity is per eye clinic.  I have made referrals, counseling and provided education to the patient based review of the above and I have provided the pt with a written personalized care plan for preventive services.  Provider list updated- see scanned forms.  Routine anticipatory guidance given to patient.  See health maintenance.  Flu 2015 Shingles d/w pt.  PNA 2014 Tetanus 2015 Colonoscopy 2011 Prostate cancer screening and PSA options (with potential risks and benefits of testing vs not testing) were discussed along with recent recs/guidelines.  He declined testing PSA at this point. Advance directive- wife designated if patient were incapacitated.  Cognitive function addressed- see scanned forms- and if abnormal then additional documentation follows.   Diabetes:  Using medications without difficulties:yes Hypoglycemic episodes:no Hyperglycemic episodes:no Feet problems:no Blood Sugars averaging: 120-160 per patient in AM eye exam within last year: yes A1c better but still >8. D/w pt about diet and exercise.   Hypertension:    Using medication without problems or lightheadedness: yes Chest pain with exertion:no Edema:no Short of breath:occ, likely smoking related, not worse, d/w pt about smoking cessation.   Elevated  Cholesterol: Using medications without problems: yes Muscle aches: no Diet compliance: yes Exercise: some D/w pt about lipids and possible change to lipitor 80mg .   Anxiety/irritability.  Work stress.  No SI/HI.  Episodic.  Asking about options.  Stable home situation but wife has noted more irritability per patient.   PMH and SH reviewed.   Vital signs, Meds and allergies reviewed.  ROS: See HPI.  Otherwise nontributory.   GEN: nad, alert and oriented HEENT: mucous membranes moist NECK: supple w/o LA CV: rrr.  no murmur PULM: ctab, no inc wob ABD: soft, +bs EXT: no edema SKIN: no acute rash  Diabetic foot exam: Normal inspection No skin breakdown No calluses  Normal DP pulses Normal sensation to light tough and monofilament Nails normal

## 2014-11-21 NOTE — Patient Instructions (Addendum)
Check with your insurance to see if they will cover the shingles shot. GI should call you about a colonoscopy this fall.  If they don't, then please call them.   Add on januvia.  Recheck sugar in about 3 months before a visit.  Change from simvastatin to atorvastatin 80mg  a day.  If you have aches on the medicine, then stop it an update me.  Use xanax if needed, use sparingly.  Sedation caution.  Take care.  Glad to see you.  Update me with your BP in about 1 week.

## 2014-11-23 ENCOUNTER — Encounter: Payer: Self-pay | Admitting: Family Medicine

## 2014-11-23 DIAGNOSIS — Z7189 Other specified counseling: Secondary | ICD-10-CM | POA: Insufficient documentation

## 2014-11-23 DIAGNOSIS — Z0000 Encounter for general adult medical examination without abnormal findings: Secondary | ICD-10-CM | POA: Insufficient documentation

## 2014-11-23 NOTE — Assessment & Plan Note (Signed)
Change to atorvastatin 80mg  a day. D/w pt.  He agrees.   Not at goal with simva 40 or 80mg  prev.  If aches noted on med, then he'll update me.

## 2014-11-23 NOTE — Assessment & Plan Note (Signed)
Already with mult med changes, will monitor.  D/w pt about smoking, diet and exercise.

## 2014-11-23 NOTE — Assessment & Plan Note (Signed)
Will add on prn xanax.  Sedation caution.  He denies illicit use now.  Okay for outpatient f/u.

## 2014-11-23 NOTE — Assessment & Plan Note (Signed)
Flu 2015  Shingles d/w pt.  PNA 2014  Tetanus 2015  Colonoscopy 2011  Prostate cancer screening and PSA options (with potential risks and benefits of testing vs not testing) were discussed along with recent recs/guidelines. He declined testing PSA at this point.  Advance directive- wife designated if patient were incapacitated.  Cognitive function addressed- see scanned forms- and if abnormal then additional documentation follows.

## 2014-11-23 NOTE — Assessment & Plan Note (Signed)
Add on januvia, continue work on diet and exercise.  Recheck A1c in about 3 months.  All d/w pt.

## 2014-11-23 NOTE — Assessment & Plan Note (Signed)
No CP, see all risk factor mgmt above.  All d/w pt.

## 2014-12-11 ENCOUNTER — Telehealth: Payer: Self-pay | Admitting: Family Medicine

## 2014-12-11 NOTE — Telephone Encounter (Signed)
Pt needs a new blood sugar meter.  His [facility] is [facility] in [place]. They told him that he must have included on the rx that lancet strips need to be included and ICD 10 code needed and specific directions needed as well on prescription. Pt states to call him with Questions.

## 2014-12-11 NOTE — Telephone Encounter (Addendum)
Spoke with patient and he will find out from his insurance company if there is a certain brand of meter that they will cover and get back with me.  Ayano Douthitt will call patient back on Thursday, May 19th.

## 2014-12-14 ENCOUNTER — Other Ambulatory Visit: Payer: Self-pay | Admitting: *Deleted

## 2014-12-14 MED ORDER — GLUCOSE BLOOD VI STRP: ORAL_STRIP | Status: DC

## 2014-12-14 MED ORDER — ACCU-CHEK AVIVA PLUS W/DEVICE KIT: PACK | Status: DC

## 2014-12-14 MED ORDER — ACCU-CHEK MULTICLIX LANCETS MISC: Status: DC

## 2014-12-14 NOTE — Telephone Encounter (Signed)
Patient states that Medicare covers the Accu-Chek meter.  Meter, lancets and strips [facility] to [facility] [name].

## 2015-01-10 ENCOUNTER — Telehealth: Payer: Self-pay

## 2015-01-10 NOTE — Telephone Encounter (Signed)
Pt left v/m; is now on medicare and pt request cb about refills of med. Left v/m requesting pt to cb.

## 2015-01-11 ENCOUNTER — Other Ambulatory Visit: Payer: Self-pay

## 2015-01-11 MED ORDER — METOPROLOL TARTRATE 25 MG PO TABS: 25.0000 mg | ORAL_TABLET | Freq: Two times a day (BID) | ORAL | Status: DC

## 2015-01-11 MED ORDER — CLOPIDOGREL BISULFATE 75 MG PO TABS: 75.0000 mg | ORAL_TABLET | Freq: Every day | ORAL | Status: DC

## 2015-01-11 MED ORDER — LOSARTAN POTASSIUM 25 MG PO TABS: 25.0000 mg | ORAL_TABLET | Freq: Every day | ORAL | Status: DC

## 2015-01-11 MED ORDER — FENOFIBRATE 160 MG PO TABS: 160.0000 mg | ORAL_TABLET | Freq: Every day | ORAL | Status: DC

## 2015-01-11 MED ORDER — METFORMIN HCL 1000 MG PO TABS: 1000.0000 mg | ORAL_TABLET | Freq: Two times a day (BID) | ORAL | Status: DC

## 2015-01-11 MED ORDER — GLIMEPIRIDE 2 MG PO TABS: ORAL_TABLET | ORAL | Status: DC

## 2015-01-11 NOTE — Telephone Encounter (Signed)
error 

## 2015-01-11 NOTE — Telephone Encounter (Signed)
Pt request refills clopidogrel,fenofibrate,glimepiride,losartan,metformin,and metoprolol to [facility] mail order advised pt done per protocol. Refills had been [facility] [name] but spoke with [facility] and those refills will be deleted.

## 2015-01-22 DIAGNOSIS — M170 Bilateral primary osteoarthritis of knee: Secondary | ICD-10-CM | POA: Diagnosis not present

## 2015-02-13 ENCOUNTER — Other Ambulatory Visit: Payer: Self-pay

## 2015-02-13 MED ORDER — PANTOPRAZOLE SODIUM 40 MG PO TBEC: 40.0000 mg | DELAYED_RELEASE_TABLET | Freq: Every day | ORAL | Status: DC

## 2015-02-13 NOTE — Telephone Encounter (Signed)
Pt request refill pantoprazole to [facility]; advised done per protocol.

## 2015-02-16 ENCOUNTER — Other Ambulatory Visit (INDEPENDENT_AMBULATORY_CARE_PROVIDER_SITE_OTHER): Payer: Medicare Other

## 2015-02-16 ENCOUNTER — Telehealth: Payer: Self-pay

## 2015-02-16 DIAGNOSIS — E119 Type 2 diabetes mellitus without complications: Secondary | ICD-10-CM

## 2015-02-16 LAB — HEMOGLOBIN A1C: Hgb A1c MFr Bld: 7.4 % — ABNORMAL HIGH (ref 4.6–6.5)

## 2015-02-16 NOTE — Telephone Encounter (Signed)
Pt came into office for labs. Pt's name was UDS list and pt was asked to renew CSA as it has been over 2 years. Pt refused and stated he had recently been away with "some buddies" and they "did some marijuana" so pt refused to provide a sample. Just FYI

## 2015-02-18 NOTE — Telephone Encounter (Signed)
Noted  

## 2015-02-21 ENCOUNTER — Encounter: Payer: Self-pay | Admitting: Family Medicine

## 2015-02-21 ENCOUNTER — Ambulatory Visit (INDEPENDENT_AMBULATORY_CARE_PROVIDER_SITE_OTHER): Payer: Medicare Other | Admitting: Family Medicine

## 2015-02-21 VITALS — BP 138/80 | HR 68 | Temp 97.9°F | Wt 250.0 lb

## 2015-02-21 DIAGNOSIS — N521 Erectile dysfunction due to diseases classified elsewhere: Secondary | ICD-10-CM | POA: Diagnosis not present

## 2015-02-21 DIAGNOSIS — E1165 Type 2 diabetes mellitus with hyperglycemia: Principal | ICD-10-CM

## 2015-02-21 DIAGNOSIS — E1159 Type 2 diabetes mellitus with other circulatory complications: Secondary | ICD-10-CM

## 2015-02-21 DIAGNOSIS — I2510 Atherosclerotic heart disease of native coronary artery without angina pectoris: Secondary | ICD-10-CM

## 2015-02-21 DIAGNOSIS — IMO0002 Reserved for concepts with insufficient information to code with codable children: Secondary | ICD-10-CM

## 2015-02-21 NOTE — Patient Instructions (Signed)
Your A1c was better.  You'll need to get a new doctor.  You'll get a letter in the mail.  Take care.  I wish you the best.

## 2015-02-21 NOTE — Progress Notes (Signed)
Pre visit review using our clinic review tool, if applicable. No additional management support is needed unless otherwise documented below in the visit note.  Diabetes:  Using medications without difficulties:yes Hypoglycemic episodes:no Hyperglycemic episodes: no sx, highest was ~200 recently Feet problems:no His clothes are looser and he's down a belt loop.  Blood Sugars averaging: was ~100 initially with starting [place].  Higher recently, ~150s.   He's been more active recently.   He's been working on his diet.  Fewer carbs.   UDS hx d/w pt.  He had prev pos, then refused UDS recently and admitted to illicit use recently.   Meds, vitals, and allergies reviewed.   ROS: See HPI.  Otherwise negative.    GEN: nad, alert and oriented HEENT: mucous membranes moist NECK: supple w/o LA CV: rrr. PULM: ctab, no inc wob ABD: soft, +bs EXT: no edema SKIN: no acute rash

## 2015-02-22 ENCOUNTER — Encounter: Payer: Self-pay | Admitting: Family Medicine

## 2015-02-22 NOTE — Assessment & Plan Note (Signed)
Improved, continue current meds and continue work on diet and weight.  He'll need to est care with new MD.  I dismissed him at the OV due to UDS hx.  The OV ended amicably.

## 2015-02-26 ENCOUNTER — Encounter: Payer: Self-pay | Admitting: Family Medicine

## 2015-02-27 ENCOUNTER — Telehealth: Payer: Self-pay | Admitting: Family Medicine

## 2015-02-27 NOTE — Telephone Encounter (Signed)
Patient dismissed from [facility] by Elsie Stain MD , effective February 26, 2015. Dismissal letter sent out by certified / registered mail. DAJ  Received signed domestic return receipt verifying delivery of certified letter on March 13, 2015. Article number [id number] DAJ

## 2015-03-02 DIAGNOSIS — L03119 Cellulitis of unspecified part of limb: Secondary | ICD-10-CM | POA: Diagnosis not present

## 2015-03-13 NOTE — Telephone Encounter (Signed)
Noted  

## 2015-03-20 DIAGNOSIS — M170 Bilateral primary osteoarthritis of knee: Secondary | ICD-10-CM | POA: Diagnosis not present

## 2015-03-20 DIAGNOSIS — M1712 Unilateral primary osteoarthritis, left knee: Secondary | ICD-10-CM | POA: Diagnosis not present

## 2015-03-20 DIAGNOSIS — M1711 Unilateral primary osteoarthritis, right knee: Secondary | ICD-10-CM | POA: Diagnosis not present

## 2015-03-21 DIAGNOSIS — D225 Melanocytic nevi of trunk: Secondary | ICD-10-CM | POA: Diagnosis not present

## 2015-03-21 DIAGNOSIS — D2262 Melanocytic nevi of left upper limb, including shoulder: Secondary | ICD-10-CM | POA: Diagnosis not present

## 2015-03-21 DIAGNOSIS — D2272 Melanocytic nevi of left lower limb, including hip: Secondary | ICD-10-CM | POA: Diagnosis not present

## 2015-03-21 DIAGNOSIS — L570 Actinic keratosis: Secondary | ICD-10-CM | POA: Diagnosis not present

## 2015-05-07 ENCOUNTER — Encounter: Payer: Self-pay | Admitting: Gastroenterology

## 2015-05-22 DIAGNOSIS — E119 Type 2 diabetes mellitus without complications: Secondary | ICD-10-CM | POA: Diagnosis not present

## 2015-05-22 DIAGNOSIS — E784 Other hyperlipidemia: Secondary | ICD-10-CM | POA: Diagnosis not present

## 2015-05-22 DIAGNOSIS — I10 Essential (primary) hypertension: Secondary | ICD-10-CM | POA: Diagnosis not present

## 2015-06-07 DIAGNOSIS — E1142 Type 2 diabetes mellitus with diabetic polyneuropathy: Secondary | ICD-10-CM | POA: Diagnosis not present

## 2015-06-07 DIAGNOSIS — I2510 Atherosclerotic heart disease of native coronary artery without angina pectoris: Secondary | ICD-10-CM | POA: Diagnosis not present

## 2015-06-07 DIAGNOSIS — Z23 Encounter for immunization: Secondary | ICD-10-CM | POA: Diagnosis not present

## 2015-06-14 DIAGNOSIS — Z23 Encounter for immunization: Secondary | ICD-10-CM | POA: Diagnosis not present

## 2015-07-11 ENCOUNTER — Encounter: Payer: Medicare Other | Admitting: Gastroenterology

## 2015-08-09 ENCOUNTER — Telehealth: Payer: Self-pay

## 2015-08-09 NOTE — Telephone Encounter (Signed)
Joshua Roy with [employer]. Left v/m requesting refill on all meds to new mail order pharmacy. Pt was discharged from [facility] 02/26/15. Spoke with pt and he said he was no longer a pt at [facility] and his refills had already been taken care of.

## 2015-08-15 ENCOUNTER — Ambulatory Visit (INDEPENDENT_AMBULATORY_CARE_PROVIDER_SITE_OTHER): Payer: Medicare Other | Admitting: Cardiovascular Disease

## 2015-08-15 ENCOUNTER — Encounter: Payer: Self-pay | Admitting: Cardiovascular Disease

## 2015-08-15 VITALS — BP 150/82 | HR 70 | Ht 70.0 in | Wt 254.5 lb

## 2015-08-15 DIAGNOSIS — N521 Erectile dysfunction due to diseases classified elsewhere: Secondary | ICD-10-CM

## 2015-08-15 DIAGNOSIS — I10 Essential (primary) hypertension: Secondary | ICD-10-CM

## 2015-08-15 DIAGNOSIS — I2510 Atherosclerotic heart disease of native coronary artery without angina pectoris: Secondary | ICD-10-CM | POA: Diagnosis not present

## 2015-08-15 DIAGNOSIS — E1165 Type 2 diabetes mellitus with hyperglycemia: Secondary | ICD-10-CM

## 2015-08-15 DIAGNOSIS — E1159 Type 2 diabetes mellitus with other circulatory complications: Secondary | ICD-10-CM | POA: Diagnosis not present

## 2015-08-15 DIAGNOSIS — J439 Emphysema, unspecified: Secondary | ICD-10-CM

## 2015-08-15 DIAGNOSIS — IMO0002 Reserved for concepts with insufficient information to code with codable children: Secondary | ICD-10-CM

## 2015-08-15 DIAGNOSIS — F17200 Nicotine dependence, unspecified, uncomplicated: Secondary | ICD-10-CM

## 2015-08-15 DIAGNOSIS — E785 Hyperlipidemia, unspecified: Secondary | ICD-10-CM

## 2015-08-15 MED ORDER — EZETIMIBE 10 MG PO TABS: 10.0000 mg | ORAL_TABLET | Freq: Every day | ORAL | Status: DC

## 2015-08-15 NOTE — Assessment & Plan Note (Signed)
Recommended he increase Lipitor up to 80 mg daily, start zetia 10 mg daily Goal LDL less than 70 High risk of coronary disease given continued smoking

## 2015-08-15 NOTE — Assessment & Plan Note (Signed)
Currently with no symptoms of angina. No further workup at this time. Continue current medication regimen. Smoking cessation recommended

## 2015-08-15 NOTE — Progress Notes (Signed)
Patient ID: Joshua Roy, male    DOB: 11/28/1950, 65 y.o.   MRN: [id number]  HPI Comments: Joshua Roy is 65 year old gentleman with a history of coronary artery disease, occluded mid to distal RCA  by cardiac catheterization in 2001, residual 60% proximal to mid LAD disease at that time, 30% diagonal disease, 10% left main disease, 20% proximal left circumflex disease. He has a long history of smoking and continues to smoke at least 1 1/2 pack per day. He has hyperlipidemia, obesity and diabetes (poorly controlled). He presents today for follow-up of his coronary artery disease  In follow-up today, he reports 65 that he is still smoking but smoking last. Typically 1 pack per day or less Reports his diabetes numbers are slowly improving, hemoglobin A1c down to 6.4. Numbers were reviewed with him Also discussed cholesterol panel, total cholesterol 155, LDL 109  He denies any chest pain concerning for angina. Likes to go travel with his RV, recently went to [facility], and had some shortness of breath which he felt was secondary to altitude  EKG on today's visit shows normal sinus rhythm with rate 70 bpm, T-wave abnormality anterolateral leads as well as 1, aVL  Other past medical history His last stress test was 2009 when he had a small fixed inferior perfusion defect, ejection fraction 51%   Allergies  Allergen Reactions  . Mucinex D [Pseudoephedrine-Guaifenesin Er]     Elevated BP, insomnia  . Ramipril     REACTION: Cough    Outpatient Encounter Prescriptions as of 08/15/2015  Medication Sig  . atorvastatin (LIPITOR) 80 MG tablet Take 1 tablet (80 mg total) by mouth daily.  . Blood Glucose Monitoring Suppl (ACCU-CHEK AVIVA PLUS) W/DEVICE KIT Use as instructed to check blood sugar once daily or as needed.  Diagnosis:  E11.9   Non insulin-dependent.  . clopidogrel (PLAVIX) 75 MG tablet Take 1 tablet (75 mg total) by mouth daily.  . fenofibrate 160 MG tablet Take 1 tablet (160  mg total) by mouth daily.  Marland Kitchen glimepiride (AMARYL) 2 MG tablet Take one half tablet (1 mg) by mouth each morning  . glucose blood (ACCU-CHEK AVIVA PLUS) test strip Use to check blood sugar once daily or as needed.  Diagnosis:  E11.9   Non insulin-dependent.  . Lancets (ACCU-CHEK MULTICLIX) lancets Use as instructed to check blood sugar once daily or as needed.  Diagnosis:  E11.9   Non insulin-dependent.  Marland Kitchen losartan (COZAAR) 25 MG tablet Take 1 tablet (25 mg total) by mouth daily.  . metFORMIN (GLUCOPHAGE) 1000 MG tablet Take 1 tablet (1,000 mg total) by mouth 2 (two) times daily.  . metoprolol tartrate (LOPRESSOR) 25 MG tablet Take 1 tablet (25 mg total) by mouth 2 (two) times daily.  . Multiple Vitamins-Minerals (MULTIVITAMIN WITH MINERALS) tablet Take 1 tablet by mouth daily.    . Omega-3 Fatty Acids (FISH OIL) 1000 MG CAPS Take by mouth 2 (two) times daily.    . pantoprazole (PROTONIX) 40 MG tablet Take 1 tablet (40 mg total) by mouth daily.  . sildenafil (VIAGRA) 100 MG tablet Take 0.5-1 tablets (50-100 mg total) by mouth daily as needed for erectile dysfunction.  . sitaGLIPtin (JANUVIA) 100 MG tablet Take 1 tablet (100 mg total) by mouth daily.   No facility-administered encounter medications on file as of 08/15/2015.    Past Medical History  Diagnosis Date  . Anxiety   . COPD (chronic obstructive pulmonary disease) (HCC)     cath, mild inf. hypokinesis EF  49%, 100% ROA  . Hyperlipidemia   . Hypertension   . ACE-inhibitor cough   . Diabetes mellitus type II   . OA (osteoarthritis)     knee OA, injected 2012 by ortho  . Coronary artery disease 2001  . Coronary atherosclerosis     Past Surgical History  Procedure Laterality Date  . Knee surgery  1969    right, open surgery- repair  . Knee arthroscopy  07/25/04    left  . Cardiac catheterization  05/06/2000    @ [facility]    Social History  reports that he has been smoking.  He has never used smokeless tobacco. He reports that he  drinks alcohol. He reports that he does not use illicit drugs.  Family History family history includes Alcohol abuse in his father; Cancer in his brother; Colon cancer in his brother; Diabetes in his father and sister; Heart disease in his father and sister; Hypertension in his father. There is no history of Prostate cancer.   Review of Systems  Eyes: Negative.   Respiratory: Positive for shortness of breath.   Cardiovascular: Negative.   Gastrointestinal: Negative.   Musculoskeletal: Negative.   Neurological: Negative.   Psychiatric/Behavioral: Negative.   All other systems reviewed and are negative.   BP 150/82 mmHg  Pulse 70  Ht 5' 10"  (1.778 m)  Wt 254 lb 8 oz (115.44 kg)  BMI 36.52 kg/m2  Physical Exam  Constitutional: He is oriented to person, place, and time. He appears well-developed and well-nourished.  obese  HENT:  Head: Normocephalic.  Nose: Nose normal.  Mouth/Throat: Oropharynx is clear and moist.  Eyes: Conjunctivae are normal. Pupils are equal, round, and reactive to light.  Neck: Normal range of motion. Neck supple. No JVD present.  Cardiovascular: Normal rate, regular rhythm, S1 normal, S2 normal, normal heart sounds and intact distal pulses.  Exam reveals no gallop and no friction rub.   No murmur heard. Pulmonary/Chest: Effort normal and breath sounds normal. No respiratory distress. He has no wheezes. He has no rales. He exhibits no tenderness.  Abdominal: Soft. Bowel sounds are normal. He exhibits no distension. There is no tenderness.  Musculoskeletal: Normal range of motion. He exhibits no edema or tenderness.  Lymphadenopathy:    He has no cervical adenopathy.  Neurological: He is alert and oriented to person, place, and time. Coordination normal.  Skin: Skin is warm and dry. No rash noted. No erythema.  Psychiatric: He has a normal mood and affect. His behavior is normal. Judgment and thought content normal.      Assessment and Plan   Nursing  note and vitals reviewed.

## 2015-08-15 NOTE — Patient Instructions (Addendum)
You are doing well. Please start zetia one a day for cholesterol Continue on lipitor  Please call us if you have new issues that need to be addressed before your next appt.  Your physician wants you to follow-up in: 12 months.  You will receive a reminder letter in the mail two months in advance. If you don't receive a letter, please call our office to schedule the follow-up appointment.

## 2015-08-15 NOTE — Assessment & Plan Note (Signed)
We have encouraged continued exercise, careful diet management in an effort to lose weight. 

## 2015-08-15 NOTE — Assessment & Plan Note (Signed)
We have encouraged him to continue to work on weaning his cigarettes and smoking cessation. He will continue to work on this and does not want any assistance with chantix.  

## 2015-08-15 NOTE — Assessment & Plan Note (Signed)
We have encouraged continued exercise, careful diet management in an effort to lose weight. It is encouraging hemoglobin A1c is slowly improving

## 2015-08-15 NOTE — Assessment & Plan Note (Signed)
Chronic mild shortness of breath on exertion, he denies any change Recommended smoking cessation

## 2015-08-15 NOTE — Assessment & Plan Note (Signed)
Blood pressure elevated on today's visit. He reports it is better controlled at home Encouraged him to watch his blood pressure, call our office if this continues to run high

## 2015-10-02 DIAGNOSIS — E1142 Type 2 diabetes mellitus with diabetic polyneuropathy: Secondary | ICD-10-CM | POA: Diagnosis not present

## 2015-10-03 DIAGNOSIS — E119 Type 2 diabetes mellitus without complications: Secondary | ICD-10-CM | POA: Diagnosis not present

## 2015-10-08 DIAGNOSIS — E1142 Type 2 diabetes mellitus with diabetic polyneuropathy: Secondary | ICD-10-CM | POA: Diagnosis not present

## 2015-10-08 DIAGNOSIS — E119 Type 2 diabetes mellitus without complications: Secondary | ICD-10-CM | POA: Diagnosis not present

## 2015-10-08 DIAGNOSIS — I2510 Atherosclerotic heart disease of native coronary artery without angina pectoris: Secondary | ICD-10-CM | POA: Diagnosis not present

## 2016-02-04 DIAGNOSIS — E119 Type 2 diabetes mellitus without complications: Secondary | ICD-10-CM | POA: Diagnosis not present

## 2016-02-07 DIAGNOSIS — E1142 Type 2 diabetes mellitus with diabetic polyneuropathy: Secondary | ICD-10-CM | POA: Diagnosis not present

## 2016-02-07 DIAGNOSIS — E119 Type 2 diabetes mellitus without complications: Secondary | ICD-10-CM | POA: Diagnosis not present

## 2016-04-01 DIAGNOSIS — L918 Other hypertrophic disorders of the skin: Secondary | ICD-10-CM | POA: Diagnosis not present

## 2016-05-01 DIAGNOSIS — M1712 Unilateral primary osteoarthritis, left knee: Secondary | ICD-10-CM | POA: Diagnosis not present

## 2016-05-01 DIAGNOSIS — M170 Bilateral primary osteoarthritis of knee: Secondary | ICD-10-CM | POA: Diagnosis not present

## 2016-05-08 ENCOUNTER — Ambulatory Visit: Payer: Self-pay | Admitting: Orthopedic Surgery

## 2016-05-08 ENCOUNTER — Telehealth: Payer: Self-pay | Admitting: Cardiovascular Disease

## 2016-05-08 NOTE — Telephone Encounter (Signed)
Received cardiac clearance request for pt to proceed w/ Left TKA - medial & lateral w/wo patella resurfacing on 07/14/16 w/ Dr. Hector Shade @ [facility]. Please route clearance to [facility] @ [phone].

## 2016-05-09 DIAGNOSIS — E119 Type 2 diabetes mellitus without complications: Secondary | ICD-10-CM | POA: Diagnosis not present

## 2016-05-11 NOTE — Telephone Encounter (Signed)
Acceptable risk for surgery Would confirm not having any new anginal sx Needs to spot plavix 5 days before the procedure

## 2016-05-12 NOTE — Telephone Encounter (Signed)
Routed to fax # provided. 

## 2016-05-13 DIAGNOSIS — I2510 Atherosclerotic heart disease of native coronary artery without angina pectoris: Secondary | ICD-10-CM | POA: Diagnosis not present

## 2016-05-13 DIAGNOSIS — E1142 Type 2 diabetes mellitus with diabetic polyneuropathy: Secondary | ICD-10-CM | POA: Diagnosis not present

## 2016-05-13 DIAGNOSIS — Z23 Encounter for immunization: Secondary | ICD-10-CM | POA: Diagnosis not present

## 2016-05-13 DIAGNOSIS — E119 Type 2 diabetes mellitus without complications: Secondary | ICD-10-CM | POA: Diagnosis not present

## 2016-05-13 DIAGNOSIS — Z01818 Encounter for other preprocedural examination: Secondary | ICD-10-CM | POA: Diagnosis not present

## 2016-05-15 DIAGNOSIS — E1142 Type 2 diabetes mellitus with diabetic polyneuropathy: Secondary | ICD-10-CM | POA: Diagnosis not present

## 2016-05-15 DIAGNOSIS — R079 Chest pain, unspecified: Secondary | ICD-10-CM | POA: Diagnosis not present

## 2016-05-22 DIAGNOSIS — M1711 Unilateral primary osteoarthritis, right knee: Secondary | ICD-10-CM | POA: Diagnosis not present

## 2016-05-23 ENCOUNTER — Ambulatory Visit: Payer: Self-pay | Admitting: Orthopedic Surgery

## 2016-05-27 DIAGNOSIS — E1142 Type 2 diabetes mellitus with diabetic polyneuropathy: Secondary | ICD-10-CM | POA: Diagnosis not present

## 2016-05-27 DIAGNOSIS — I2510 Atherosclerotic heart disease of native coronary artery without angina pectoris: Secondary | ICD-10-CM | POA: Diagnosis not present

## 2016-05-27 DIAGNOSIS — I10 Essential (primary) hypertension: Secondary | ICD-10-CM | POA: Diagnosis not present

## 2016-05-27 DIAGNOSIS — R079 Chest pain, unspecified: Secondary | ICD-10-CM | POA: Diagnosis not present

## 2016-06-12 ENCOUNTER — Encounter (HOSPITAL_COMMUNITY): Payer: Self-pay | Admitting: *Deleted

## 2016-06-16 DIAGNOSIS — M1712 Unilateral primary osteoarthritis, left knee: Secondary | ICD-10-CM | POA: Diagnosis not present

## 2016-06-27 DIAGNOSIS — J209 Acute bronchitis, unspecified: Secondary | ICD-10-CM | POA: Diagnosis not present

## 2016-06-27 DIAGNOSIS — E1142 Type 2 diabetes mellitus with diabetic polyneuropathy: Secondary | ICD-10-CM | POA: Diagnosis not present

## 2016-06-27 DIAGNOSIS — I2510 Atherosclerotic heart disease of native coronary artery without angina pectoris: Secondary | ICD-10-CM | POA: Diagnosis not present

## 2016-06-27 DIAGNOSIS — J40 Bronchitis, not specified as acute or chronic: Secondary | ICD-10-CM

## 2016-06-27 DIAGNOSIS — I10 Essential (primary) hypertension: Secondary | ICD-10-CM | POA: Diagnosis not present

## 2016-06-27 HISTORY — DX: Bronchitis, not specified as acute or chronic: J40

## 2016-07-02 ENCOUNTER — Ambulatory Visit
Admission: RE | Admit: 2016-07-02 | Discharge: 2016-07-02 | Disposition: A | Discharge status: Home / Self Care | Payer: Medicare Other | Source: Ambulatory Visit | Attending: Internal Medicine | Admitting: Internal Medicine

## 2016-07-02 ENCOUNTER — Other Ambulatory Visit: Payer: Self-pay | Admitting: Internal Medicine

## 2016-07-02 DIAGNOSIS — R059 Cough, unspecified: Secondary | ICD-10-CM

## 2016-07-02 DIAGNOSIS — R05 Cough: Secondary | ICD-10-CM

## 2016-07-02 DIAGNOSIS — E785 Hyperlipidemia, unspecified: Secondary | ICD-10-CM | POA: Diagnosis not present

## 2016-07-02 DIAGNOSIS — I2510 Atherosclerotic heart disease of native coronary artery without angina pectoris: Secondary | ICD-10-CM | POA: Diagnosis not present

## 2016-07-02 DIAGNOSIS — E119 Type 2 diabetes mellitus without complications: Secondary | ICD-10-CM | POA: Diagnosis not present

## 2016-07-02 DIAGNOSIS — J218 Acute bronchiolitis due to other specified organisms: Secondary | ICD-10-CM | POA: Diagnosis not present

## 2016-07-02 DIAGNOSIS — E1142 Type 2 diabetes mellitus with diabetic polyneuropathy: Secondary | ICD-10-CM | POA: Diagnosis not present

## 2016-07-07 DIAGNOSIS — I10 Essential (primary) hypertension: Secondary | ICD-10-CM | POA: Diagnosis not present

## 2016-07-07 DIAGNOSIS — E119 Type 2 diabetes mellitus without complications: Secondary | ICD-10-CM | POA: Diagnosis not present

## 2016-07-07 DIAGNOSIS — I2510 Atherosclerotic heart disease of native coronary artery without angina pectoris: Secondary | ICD-10-CM | POA: Diagnosis not present

## 2016-07-07 DIAGNOSIS — E1142 Type 2 diabetes mellitus with diabetic polyneuropathy: Secondary | ICD-10-CM | POA: Diagnosis not present

## 2016-07-07 NOTE — Patient Instructions (Addendum)
Joshua Roy  07/07/2016   Your procedure is scheduled on: 07/14/16  Report to [facility] Main  Entrance take [place]  elevators to 3rd floor to  [place] at [place] AM.  Call this number if you have problems the morning of surgery [phone]   Remember: ONLY 1 PERSON MAY GO WITH YOU TO SHORT STAY TO GET  READY MORNING OF YOUR SURGERY.  Do not eat food or drink liquids :After Midnight.     Take these medicines the morning of surgery with A SIP OF WATER:METOPROLOL,Pantoprazole   Use Brio Ellipta if needed  DO NOT TAKE ANY DIABETIC MEDICATIONS DAY OF YOUR SURGERY                               You may not have any metal on your body including hair pins and              piercings  Do not wear jewelry, make-up, lotions, powders or perfumes, deodorant             Do not wear nail polish.  Do not shave  48 hours prior to surgery.              Men may shave face and neck.   Do not bring valuables to the hospital. [place].  Contacts, dentures or bridgework may not be worn into surgery.  Leave suitcase in the car. After surgery it may be brought to your room. r:  Special Instructions: N/A              Please read over the following fact sheets you were given: _____________________________________________________________________             How to Manage Your Diabetes Before and After Surgery  Why is it important to control my blood sugar before and after surgery? . Improving blood sugar levels before and after surgery helps healing and can limit problems. . A way of improving blood sugar control is eating a healthy diet by: o  Eating less sugar and carbohydrates o  Increasing activity/exercise o  Talking with your doctor about reaching your blood sugar goals . High blood sugars (greater than 180 mg/dL) can raise your risk of infections and slow your recovery, so you will need to focus on  controlling your diabetes during the weeks before surgery. . Make sure that the doctor who takes care of your diabetes knows about your planned surgery including the date and location.  How do I manage my blood sugar before surgery? . Check your blood sugar at least 4 times a day, starting 2 days before surgery, to make sure that the level is not too high or low. o Check your blood sugar the morning of your surgery when you wake up and every 2 hours until you get to the Short Stay unit. . If your blood sugar is less than 70 mg/dL, you will need to treat for low blood sugar: o Do not take insulin. o Treat a low blood sugar (less than 70 mg/dL) with  cup of clear juice (cranberry or apple), 4 glucose tablets, OR glucose gel. o Recheck blood sugar in 15 minutes after treatment (to make sure it is greater than  70 mg/dL). If your blood sugar is not greater than 70 mg/dL on recheck, call [phone] for further instructions. . Report your blood sugar to the short stay nurse when you get to Short Stay.  . If you are admitted to the hospital after surgery: o Your blood sugar will be checked by the staff and you will probably be given insulin after surgery (instead of oral diabetes medicines) to make sure you have good blood sugar levels. o The goal for blood sugar control after surgery is 80-180 mg/dL.   WHAT DO I DO ABOUT MY DIABETES MEDICATION?  Marland Kitchen Do not take oral diabetes medicines (pills) the morning of surgery.  .     Patient Signature:  Date:   Nurse Signature:  Date:   Reviewed and Endorsed by [facility] Patient Education Committee, August 2015[facility] - Preparing for Surgery Before surgery, you can play an important role.  Because skin is not sterile, your skin needs to be as free of germs as possible.  You can reduce the number of germs on your skin by washing with CHG (chlorahexidine gluconate) soap before surgery.  CHG is an antiseptic cleaner which kills germs and bonds with  the skin to continue killing germs even after washing. Please DO NOT use if you have an allergy to CHG or antibacterial soaps.  If your skin becomes reddened/irritated stop using the CHG and inform your nurse when you arrive at Short Stay. Do not shave (including legs and underarms) for at least 48 hours prior to the first CHG shower.  You may shave your face/neck. Please follow these instructions carefully:  1.  Shower with CHG Soap the night before surgery and the  morning of Surgery.  2.  If you choose to wash your hair, wash your hair first as usual with your  normal  shampoo.  3.  After you shampoo, rinse your hair and body thoroughly to remove the  shampoo.                           4.  Use CHG as you would any other liquid soap.  You can apply chg directly  to the skin and wash                       Gently with a scrungie or clean washcloth.  5.  Apply the CHG Soap to your body ONLY FROM THE NECK DOWN.   Do not use on face/ open                           Wound or open sores. Avoid contact with eyes, ears mouth and genitals (private parts).                       Wash face,  Genitals (private parts) with your normal soap.             6.  Wash thoroughly, paying special attention to the area where your surgery  will be performed.  7.  Thoroughly rinse your body with warm water from the neck down.  8.  DO NOT shower/wash with your normal soap after using and rinsing off  the CHG Soap.                9.  Pat yourself dry with a clean towel.  10.  Wear clean pajamas.            11.  Place clean sheets on your bed the night of your first shower and do not  sleep with pets. Day of Surgery : Do not apply any lotions/deodorants the morning of surgery.  Please wear clean clothes to the hospital/surgery center.  FAILURE TO FOLLOW THESE INSTRUCTIONS MAY RESULT IN THE CANCELLATION OF YOUR SURGERY   Incentive Spirometer  An incentive spirometer is a tool that can help keep your lungs clear  and active. This tool measures how well you are filling your lungs with each breath. Taking long deep breaths may help reverse or decrease the chance of developing breathing (pulmonary) problems (especially infection) following:  A long period of time when you are unable to move or be active. BEFORE THE PROCEDURE   If the spirometer includes an indicator to show your best effort, your nurse or respiratory therapist will set it to a desired goal.  If possible, sit up straight or lean slightly forward. Try not to slouch.  Hold the incentive spirometer in an upright position. INSTRUCTIONS FOR USE  1. Sit on the edge of your bed if possible, or sit up as far as you can in bed or on a chair. 2. Hold the incentive spirometer in an upright position. 3. Breathe out normally. 4. Place the mouthpiece in your mouth and seal your lips tightly around it. 5. Breathe in slowly and as deeply as possible, raising the piston or the ball toward the top of the column. 6. Hold your breath for 3-5 seconds or for as long as possible. Allow the piston or ball to fall to the bottom of the column. 7. Remove the mouthpiece from your mouth and breathe out normally. 8. Rest for a few seconds and repeat Steps 1 through 7 at least 10 times every 1-2 hours when you are awake. Take your time and take a few normal breaths between deep breaths. 9. The spirometer may include an indicator to show your best effort. Use the indicator as a goal to work toward during each repetition. 10. After each set of 10 deep breaths, practice coughing to be sure your lungs are clear. If you have an incision (the cut made at the time of surgery), support your incision when coughing by placing a pillow or rolled up towels firmly against it. Once you are able to get out of bed, walk around indoors and cough well. You may stop using the incentive spirometer when instructed by your caregiver.  RISKS AND COMPLICATIONS  Take your time so you do not  get dizzy or light-headed.  If you are in pain, you may need to take or ask for pain medication before doing incentive spirometry. It is harder to take a deep breath if you are having pain. AFTER USE  Rest and breathe slowly and easily.  It can be helpful to keep track of a log of your progress. Your caregiver can provide you with a simple table to help with this. If you are using the spirometer at home, follow these instructions: [place] IF:   You are having difficultly using the spirometer.  You have trouble using the spirometer as often as instructed.  Your pain medication is not giving enough relief while using the spirometer.  You develop fever of 100.5 F (38.1 C) or higher. SEEK IMMEDIATE MEDICAL CARE IF:   You cough up bloody sputum that had not been present before.  You develop fever of 102 F (38.9 C) or greater.  You develop worsening pain at or near the incision site. MAKE SURE YOU:   Understand these instructions.  Will watch your condition.  Will get help right away if you are not doing well or get worse. Document Released: 11/24/2006 Document Revised: 10/06/2011 Document Reviewed: 01/25/2007 ExitCare Patient Information 2014 ExitCare, [place].   ________________________________________________________________________  WHAT IS A BLOOD TRANSFUSION? Blood Transfusion Information  A transfusion is the replacement of blood or some of its parts. Blood is made up of multiple cells which provide different functions.  Red blood cells carry oxygen and are used for blood loss replacement.  White blood cells fight against infection.  Platelets control bleeding.  Plasma helps clot blood.  Other blood products are available for specialized needs, such as hemophilia or other clotting disorders. BEFORE THE TRANSFUSION  Who gives blood for transfusions?   Healthy volunteers who are fully evaluated to make sure their blood is safe. This is blood bank  blood. Transfusion therapy is the safest it has ever been in the practice of medicine. Before blood is taken from a donor, a complete history is taken to make sure that person has no history of diseases nor engages in risky social behavior (examples are intravenous drug use or sexual activity with multiple partners). The donor's travel history is screened to minimize risk of transmitting infections, such as malaria. The donated blood is tested for signs of infectious diseases, such as HIV and hepatitis. The blood is then tested to be sure it is compatible with you in order to minimize the chance of a transfusion reaction. If you or a relative donates blood, this is often done in anticipation of surgery and is not appropriate for emergency situations. It takes many days to process the donated blood. RISKS AND COMPLICATIONS Although transfusion therapy is very safe and saves many lives, the main dangers of transfusion include:   Getting an infectious disease.  Developing a transfusion reaction. This is an allergic reaction to something in the blood you were given. Every precaution is taken to prevent this. The decision to have a blood transfusion has been considered carefully by your caregiver before blood is given. Blood is not given unless the benefits outweigh the risks. AFTER THE TRANSFUSION  Right after receiving a blood transfusion, you will usually feel much better and more energetic. This is especially true if your red blood cells have gotten low (anemic). The transfusion raises the level of the red blood cells which carry oxygen, and this usually causes an energy increase.  The nurse administering the transfusion will monitor you carefully for complications. HOME CARE INSTRUCTIONS  No special instructions are needed after a transfusion. You may find your energy is better. Speak with your caregiver about any limitations on activity for underlying diseases you may have. SEEK MEDICAL CARE IF:    Your condition is not improving after your transfusion.  You develop redness or irritation at the intravenous (IV) site. SEEK IMMEDIATE MEDICAL CARE IF:  Any of the following symptoms occur over the next 12 hours:  Shaking chills.  You have a temperature by mouth above 102 F (38.9 C), not controlled by medicine.  Chest, back, or muscle pain.  People around you feel you are not acting correctly or are confused.  Shortness of breath or difficulty breathing.  Dizziness and fainting.  You get a rash or develop hives.  You have a decrease in urine output.  Your urine turns  a dark color or changes to pink, red, or brown. Any of the following symptoms occur over the next 10 days:  You have a temperature by mouth above 102 F (38.9 C), not controlled by medicine.  Shortness of breath.  Weakness after normal activity.  The white part of the eye turns yellow (jaundice).  You have a decrease in the amount of urine or are urinating less often.  Your urine turns a dark color or changes to pink, red, or brown. Document Released: 07/11/2000 Document Revised: 10/06/2011 Document Reviewed: 02/28/2008 [facility] Patient Information 2014 [place], [place].  _______________________________________________________________________

## 2016-07-07 NOTE — Progress Notes (Signed)
lov dr Dellia Beckwith 07/02/16,06/27/16 on chart,ov with eccho report on chart, ekg 12 6 17  on chart, stress test report 10/17 on chart with ekg

## 2016-07-08 ENCOUNTER — Encounter (HOSPITAL_COMMUNITY)
Admission: RE | Admit: 2016-07-08 | Discharge: 2016-07-08 | Disposition: A | Discharge status: Home / Self Care | Payer: Medicare Other | Source: Ambulatory Visit | Attending: Orthopedic Surgery | Admitting: Orthopedic Surgery

## 2016-07-08 ENCOUNTER — Encounter (HOSPITAL_COMMUNITY): Payer: Self-pay

## 2016-07-08 DIAGNOSIS — Z01812 Encounter for preprocedural laboratory examination: Secondary | ICD-10-CM | POA: Diagnosis not present

## 2016-07-08 DIAGNOSIS — M1712 Unilateral primary osteoarthritis, left knee: Secondary | ICD-10-CM | POA: Diagnosis not present

## 2016-07-08 HISTORY — DX: Bronchitis, not specified as acute or chronic: J40

## 2016-07-08 HISTORY — DX: Pneumonia, unspecified organism: J18.9

## 2016-07-08 LAB — GLUCOSE, CAPILLARY: GLUCOSE-CAPILLARY: 101 mg/dL — AB (ref 65–99)

## 2016-07-08 LAB — URINALYSIS, ROUTINE W REFLEX MICROSCOPIC
BILIRUBIN URINE: NEGATIVE
Glucose, UA: NEGATIVE mg/dL
HGB URINE DIPSTICK: NEGATIVE
KETONES UR: NEGATIVE mg/dL
Leukocytes, UA: NEGATIVE
Nitrite: NEGATIVE
PROTEIN: NEGATIVE mg/dL
SPECIFIC GRAVITY, URINE: 1.009 (ref 1.005–1.030)
pH: 6 (ref 5.0–8.0)

## 2016-07-08 LAB — COMPREHENSIVE METABOLIC PANEL
ALBUMIN: 3.9 g/dL (ref 3.5–5.0)
ALK PHOS: 25 U/L — AB (ref 38–126)
ALT: 21 U/L (ref 17–63)
AST: 19 U/L (ref 15–41)
Anion gap: 6 (ref 5–15)
BILIRUBIN TOTAL: 0.6 mg/dL (ref 0.3–1.2)
BUN: 22 mg/dL — AB (ref 6–20)
CALCIUM: 8.7 mg/dL — AB (ref 8.9–10.3)
CO2: 30 mmol/L (ref 22–32)
CREATININE: 1.17 mg/dL (ref 0.61–1.24)
Chloride: 104 mmol/L (ref 101–111)
GFR calc Af Amer: >60 mL/min (ref >60)
GFR calc non Af Amer: >60 mL/min (ref >60)
GLUCOSE: 86 mg/dL (ref 65–99)
Potassium: 4.6 mmol/L (ref 3.5–5.1)
SODIUM: 140 mmol/L (ref 135–145)
Total Protein: 6.6 g/dL (ref 6.5–8.1)

## 2016-07-08 LAB — CBC
HEMATOCRIT: 50.5 % (ref 39.0–52.0)
HEMOGLOBIN: 16.7 g/dL (ref 13.0–17.0)
MCH: 27 pg (ref 26.0–34.0)
MCHC: 33.1 g/dL (ref 30.0–36.0)
MCV: 81.7 fL (ref 78.0–100.0)
Platelets: 283 10*3/uL (ref 150–400)
RBC: 6.18 MIL/uL — AB (ref 4.22–5.81)
RDW: 18.5 % — ABNORMAL HIGH (ref 11.5–15.5)
WBC: 11.2 10*3/uL — ABNORMAL HIGH (ref 4.0–10.5)

## 2016-07-08 LAB — APTT: APTT: 26 s (ref 24–36)

## 2016-07-08 LAB — PROTIME-INR
INR: 0.95
Prothrombin Time: 12.7 seconds (ref 11.4–15.2)

## 2016-07-08 LAB — SURGICAL PCR SCREEN
MRSA, PCR: NEGATIVE
STAPHYLOCOCCUS AUREUS: POSITIVE — AB

## 2016-07-08 LAB — ABO/RH: ABO/RH(D): O NEG

## 2016-07-08 NOTE — Progress Notes (Signed)
   07/08/16 1030  OBSTRUCTIVE SLEEP APNEA  Have you ever been diagnosed with sleep apnea through a sleep study? No  Do you snore loudly (loud enough to be heard through closed doors)?  1  Do you often feel tired, fatigued, or sleepy during the daytime (such as falling asleep during driving or talking to someone)? 0  Has anyone observed you stop breathing during your sleep? 1  Do you have, or are you being treated for high blood pressure? 1  BMI more than 35 kg/m2? 0  Age > 50 (1-yes) 1  Neck circumference greater than:Male 16 inches or larger, Male 17inches or larger? 0  Male Gender (Yes=1) 1  Obstructive Sleep Apnea Score 5

## 2016-07-09 LAB — HEMOGLOBIN A1C
HEMOGLOBIN A1C: 6.6 % — AB (ref 4.8–5.6)
MEAN PLASMA GLUCOSE: 143 mg/dL

## 2016-07-13 ENCOUNTER — Ambulatory Visit: Payer: Self-pay | Admitting: Orthopedic Surgery

## 2016-07-13 NOTE — H&P (Signed)
Joshua Roy DOB: [birth year] Married / Language: English / Race: White Male Date of Admission:  07/14/2016 CC:  Left Knee Pain History of Present Illness The patient is a 65 year old male who comes in for a preoperative History and Physical. The patient is scheduled for a left total knee arthroplasty to be performed by Dr. Dione Plover. Aluisio, MD at [facility] on 07-14-2016. The patient is a 65 year old male who presents today for follow up of their knee. The patient is being followed for their right knee pain and osteoarthritis. Symptoms reported include: pain, pain with weightbearing, difficulty ambulating, difficulty arising from a kneeling position and difficulty arising from chair. The patient feels that they are doing poorly and report their pain level to be moderate to severe. Current treatment includes: glucosamine. The following medication has been used for pain control: Oxycodone. Unfortunately, both knees are getting progressively worse. He is self employed and says it is getting harder for him to do the activities that he desires. The knee is hurting at all times. It is limiting what he can and cannot do. He states that the pain is starting to occur at night now also. He does not get any swelling. The knee does not give out on him. Has not locked on him. He is not having any associated hip pain. He is not having lower extremity weakness or paresthesia with it. AP and lateral of the knees show that he has bone on bone arthritis medial and patellofemoral both knees, worse on the left than the right. At this point, the most predictable means of improving pain and function is total knee arthroplasty. He got advanced arthritic change both knees, left more symptomatic than the right and left worse radiographically. He is at a stage now where the knee has essentially taken over his life. Long term the only anything is going to make him better would be a total knee arthroplasty. He  would like to proceed with surgery. They have been treated conservatively in the past for the above stated problem and despite conservative measures, they continue to have progressive pain and severe functional limitations and dysfunction. They have failed non-operative management including home exercise, medications, and injections. It is felt that they would benefit from undergoing total joint replacement. Risks and benefits of the procedure have been discussed with the patient and they elect to proceed with surgery. There are no active contraindications to surgery such as ongoing infection or rapidly progressive neurological disease.  Problem List/Past Medical  Hypercholesterolemia  Osteoarthritis  Diabetes Mellitus, Type II  Coronary artery disease  Primary osteoarthritis of left knee (M17.12)  Myocardial infarction  2001 Gastric Ulcer   Allergies NSAIDs  CANNOT USE NSAIDS DUE TO RECENT DX OF ULCER Altace *ANTIHYPERTENSIVES*  Cough.  Family History Diabetes Mellitus  First Degree Relatives. father and sister Heart Disease  father Heart disease in male family member before age 55  Hypertension  mother and father Rheumatoid Arthritis  sister and brother  Social History Previously in rehab  no Tobacco use  Current every day smoker. current every day smoker; smoke(d) 1 pack(s) per day Tobacco / smoke exposure  no Most recent primary occupation  Curator Alcohol use  current drinker; drinks hard liquor; only occasionally per week Pain Contract  no Number of flights of stairs before winded  2-3 Marital status  married Living situation  live with spouse Illicit drug use  no Exercise  Exercises weekly; does other Drug/Alcohol  Rehab (Currently)  no Current work status  working full time Children  3  Medication History Januvia (100MG  Tablet, Oral) Active. Glimepiride (2MG  Tablet, Oral) Active. Losartan Potassium (25MG  Tablet, Oral)  Active. Metoprolol Tartrate (25MG  Tablet, Oral) Active. Pantoprazole Sodium (40MG  Tablet DR, Oral) Active. Clopidogrel Bisulfate (75MG  Tablet, Oral) Active. Fenofibrate (160MG  Tablet, Oral) Active. MetFORMIN HCl (1000MG  Tablet, Oral) Active. Simvastatin (80MG  Tablet, Oral) Active.  Past Surgical History Vasectomy  Arthroscopy of Knee  left   Review of Systems General Not Present- Chills, Fatigue, Fever, Memory Loss, Night Sweats, Weight Gain and Weight Loss. Skin Not Present- Eczema, Hives, Itching, Lesions and Rash. HEENT Not Present- Dentures, Double Vision, Headache, Hearing Loss, Tinnitus and Visual Loss. Respiratory Present- Cough, Shortness of breath with exertion and Sputum Production (recent cold). Not Present- Allergies, Chronic Cough, Coughing up blood and Shortness of breath at rest. Cardiovascular Not Present- Chest Pain, Difficulty Breathing Lying Down, Murmur, Palpitations, Racing/skipping heartbeats and Swelling. Gastrointestinal Not Present- Abdominal Pain, Bloody Stool, Constipation, Diarrhea, Difficulty Swallowing, Heartburn, Jaundice, Loss of appetitie, Nausea and Vomiting. Male Genitourinary Present- Urinating at Night. Not Present- Blood in Urine, Discharge, Flank Pain, Incontinence, Painful Urination, Urgency, Urinary frequency, Urinary Retention and Weak urinary stream. Musculoskeletal Present- Joint Pain and Joint Swelling. Not Present- Back Pain, Morning Stiffness, Muscle Pain, Muscle Weakness and Spasms. Neurological Not Present- Blackout spells, Difficulty with balance, Dizziness, Paralysis, Tremor and Weakness. Psychiatric Present- Insomnia.  Vitals  Weight: 225 lb Height: 69in Weight was reported by patient. Height was reported by patient. Body Surface Area: 2.17 m Body Mass Index: 33.23 kg/m  Pulse: 60 (Regular)  BP: 146/72 (Sitting, Right Arm, Standard)  Physical Exam General Mental Status -Alert, cooperative and good  historian. General Appearance-pleasant, Not in acute distress. Orientation-Oriented X3. Build & Nutrition-Well nourished and Well developed.  Head and Neck Head-normocephalic, atraumatic . Neck Global Assessment - supple, no bruit auscultated on the right, no bruit auscultated on the left.  Eye Pupil - Bilateral-Regular and Round. Motion - Bilateral-EOMI.  Chest and Lung Exam Auscultation Breath sounds - clear at anterior chest wall and clear at posterior chest wall. Adventitious sounds - No Adventitious sounds.  Cardiovascular Auscultation Rhythm - Regular rate and rhythm. Heart Sounds - S1 WNL and S2 WNL. Murmurs & Other Heart Sounds - Auscultation of the heart reveals - No Murmurs.  Abdomen Palpation/Percussion Tenderness - Abdomen is non-tender to palpation. Rigidity (guarding) - Abdomen is soft. Auscultation Auscultation of the abdomen reveals - Bowel sounds normal.  Male Genitourinary Note: Not done, not pertinent to present illness   Musculoskeletal Note: Well developed male in no distress. Evaluations of his hips show normal range of motion, no discomfort. Left knee shows no effusion. Range about 10 to 125, marked crepitus on range of motion with tenderness medial greater than lateral. No instability. His right knee shows no effusion. Range is about 5 to 120. Marked crepitus on range of motion. Tender medial greater than lateral, no instability. There is no deformity on the right.  RADIOGRAPHS AP and lateral of the knees show that he has bone on bone arthritis medial and patellofemoral both knees, worse on the left than the right.  Assessment & Plan Primary osteoarthritis of right knee (M17.11) Primary osteoarthritis of left knee (M17.12)  Note:Surgical Plans: Left Right Total Knee Hip Replacement - Anterior Approach  Disposition: Home with wife  Cards: Dr. Johnny Bridge - Patient has been seen preoperatively and felt to be stable for surgery.  "Needs to stop Plavix 5  days before the procedure."  Topical TXA - CAD  Anesthesia Issues: Nausea when he was a teenager  Signed electronically by Ok Edwards, III PA-C

## 2016-07-14 ENCOUNTER — Inpatient Hospital Stay (HOSPITAL_COMMUNITY)
Admission: RE | Admit: 2016-07-14 | Discharge: 2016-07-16 | DRG: 470 | Disposition: A | Discharge status: Home Health | Payer: Medicare Other | Source: Ambulatory Visit | Attending: Orthopedic Surgery | Admitting: Orthopedic Surgery

## 2016-07-14 ENCOUNTER — Inpatient Hospital Stay (HOSPITAL_COMMUNITY): Payer: Medicare Other | Admitting: Anesthesiology

## 2016-07-14 ENCOUNTER — Encounter (HOSPITAL_COMMUNITY): Payer: Self-pay | Admitting: *Deleted

## 2016-07-14 ENCOUNTER — Encounter (HOSPITAL_COMMUNITY)
Admission: RE | Disposition: A | Discharge status: Home Health | Payer: Self-pay | Source: Ambulatory Visit | Attending: Orthopedic Surgery

## 2016-07-14 DIAGNOSIS — E7800 Pure hypercholesterolemia, unspecified: Secondary | ICD-10-CM | POA: Diagnosis present

## 2016-07-14 DIAGNOSIS — I2510 Atherosclerotic heart disease of native coronary artery without angina pectoris: Secondary | ICD-10-CM | POA: Diagnosis present

## 2016-07-14 DIAGNOSIS — E785 Hyperlipidemia, unspecified: Secondary | ICD-10-CM | POA: Diagnosis present

## 2016-07-14 DIAGNOSIS — Z96652 Presence of left artificial knee joint: Secondary | ICD-10-CM

## 2016-07-14 DIAGNOSIS — J449 Chronic obstructive pulmonary disease, unspecified: Secondary | ICD-10-CM | POA: Diagnosis present

## 2016-07-14 DIAGNOSIS — Z7984 Long term (current) use of oral hypoglycemic drugs: Secondary | ICD-10-CM

## 2016-07-14 DIAGNOSIS — M1710 Unilateral primary osteoarthritis, unspecified knee: Secondary | ICD-10-CM | POA: Diagnosis present

## 2016-07-14 DIAGNOSIS — I252 Old myocardial infarction: Secondary | ICD-10-CM

## 2016-07-14 DIAGNOSIS — M1712 Unilateral primary osteoarthritis, left knee: Secondary | ICD-10-CM | POA: Diagnosis present

## 2016-07-14 DIAGNOSIS — M25562 Pain in left knee: Secondary | ICD-10-CM | POA: Diagnosis not present

## 2016-07-14 DIAGNOSIS — M179 Osteoarthritis of knee, unspecified: Secondary | ICD-10-CM | POA: Diagnosis present

## 2016-07-14 DIAGNOSIS — I10 Essential (primary) hypertension: Secondary | ICD-10-CM | POA: Diagnosis present

## 2016-07-14 DIAGNOSIS — E119 Type 2 diabetes mellitus without complications: Secondary | ICD-10-CM | POA: Diagnosis present

## 2016-07-14 DIAGNOSIS — G8918 Other acute postprocedural pain: Secondary | ICD-10-CM | POA: Diagnosis not present

## 2016-07-14 DIAGNOSIS — F17210 Nicotine dependence, cigarettes, uncomplicated: Secondary | ICD-10-CM | POA: Diagnosis present

## 2016-07-14 HISTORY — PX: TOTAL KNEE ARTHROPLASTY: SHX125

## 2016-07-14 LAB — GLUCOSE, CAPILLARY
GLUCOSE-CAPILLARY: 137 mg/dL — AB (ref 65–99)
GLUCOSE-CAPILLARY: 117 mg/dL — AB (ref 65–99)
GLUCOSE-CAPILLARY: 158 mg/dL — AB (ref 65–99)
Glucose-Capillary: 175 mg/dL — ABNORMAL HIGH (ref 65–99)

## 2016-07-14 LAB — TYPE AND SCREEN
ABO/RH(D): O NEG
Antibody Screen: NEGATIVE

## 2016-07-14 SURGERY — ARTHROPLASTY, KNEE, TOTAL
Anesthesia: Spinal | Site: Knee | Laterality: Left

## 2016-07-14 MED ORDER — CEFAZOLIN SODIUM-DEXTROSE 2-4 GM/100ML-% IV SOLN
INTRAVENOUS | Status: AC
  Filled 2016-07-14: qty 100

## 2016-07-14 MED ORDER — ROPIVACAINE HCL 7.5 MG/ML IJ SOLN
INTRAMUSCULAR | Status: DC | PRN
  Administered 2016-07-14: 20 mL via PERINEURAL

## 2016-07-14 MED ORDER — FENTANYL CITRATE (PF) 100 MCG/2ML IJ SOLN
INTRAMUSCULAR | Status: AC
  Filled 2016-07-14: qty 2

## 2016-07-14 MED ORDER — HYDROMORPHONE HCL 2 MG/ML IJ SOLN
2.0000 mg | Freq: Once | INTRAMUSCULAR | Status: AC
  Administered 2016-07-14: 2 mg via INTRAVENOUS
  Filled 2016-07-14: qty 1

## 2016-07-14 MED ORDER — PANTOPRAZOLE SODIUM 40 MG PO TBEC
40.0000 mg | DELAYED_RELEASE_TABLET | Freq: Every day | ORAL | Status: DC
  Administered 2016-07-15 – 2016-07-16 (×2): 40 mg via ORAL
  Filled 2016-07-14 (×2): qty 1

## 2016-07-14 MED ORDER — OXYCODONE HCL 5 MG PO TABS
5.0000 mg | ORAL_TABLET | ORAL | Status: DC | PRN
  Administered 2016-07-14: 5 mg via ORAL
  Administered 2016-07-14 – 2016-07-15 (×4): 10 mg via ORAL
  Filled 2016-07-14 (×4): qty 2
  Filled 2016-07-14: qty 1

## 2016-07-14 MED ORDER — MIDAZOLAM HCL 2 MG/2ML IJ SOLN
1.0000 mg | Freq: Once | INTRAMUSCULAR | Status: AC
  Administered 2016-07-14: 1 mg via INTRAVENOUS

## 2016-07-14 MED ORDER — LACTATED RINGERS IV SOLN
INTRAVENOUS | Status: DC
  Administered 2016-07-14 (×2): via INTRAVENOUS

## 2016-07-14 MED ORDER — GLIPIZIDE ER 5 MG PO TB24
10.0000 mg | ORAL_TABLET | Freq: Every day | ORAL | Status: DC
  Administered 2016-07-15 – 2016-07-16 (×2): 10 mg via ORAL
  Filled 2016-07-14 (×2): qty 2

## 2016-07-14 MED ORDER — METOCLOPRAMIDE HCL 5 MG PO TABS: 5.0000 mg | ORAL_TABLET | Freq: Three times a day (TID) | ORAL | Status: DC | PRN

## 2016-07-14 MED ORDER — POLYETHYLENE GLYCOL 3350 17 G PO PACK: 17.0000 g | PACK | Freq: Every day | ORAL | Status: DC | PRN

## 2016-07-14 MED ORDER — PHENOL 1.4 % MT LIQD
1.0000 | OROMUCOSAL | Status: DC | PRN
Start: 2016-07-14 — End: 2016-07-16

## 2016-07-14 MED ORDER — CHLORHEXIDINE GLUCONATE 4 % EX LIQD: 60.0000 mL | Freq: Once | CUTANEOUS | Status: DC

## 2016-07-14 MED ORDER — MIDAZOLAM HCL 5 MG/5ML IJ SOLN
INTRAMUSCULAR | Status: DC | PRN
  Administered 2016-07-14: 2 mg via INTRAVENOUS

## 2016-07-14 MED ORDER — ACETAMINOPHEN 325 MG PO TABS: 650.0000 mg | ORAL_TABLET | Freq: Four times a day (QID) | ORAL | Status: DC | PRN

## 2016-07-14 MED ORDER — ACETAMINOPHEN 10 MG/ML IV SOLN: 1000.0000 mg | Freq: Once | INTRAVENOUS | Status: DC

## 2016-07-14 MED ORDER — ACETAMINOPHEN 650 MG RE SUPP: 650.0000 mg | Freq: Four times a day (QID) | RECTAL | Status: DC | PRN

## 2016-07-14 MED ORDER — BUPIVACAINE HCL (PF) 0.25 % IJ SOLN
INTRAMUSCULAR | Status: AC
  Filled 2016-07-14: qty 30

## 2016-07-14 MED ORDER — SODIUM CHLORIDE 0.9 % IR SOLN
Status: DC | PRN
  Administered 2016-07-14: 1000 mL

## 2016-07-14 MED ORDER — ONDANSETRON HCL 4 MG/2ML IJ SOLN
4.0000 mg | Freq: Four times a day (QID) | INTRAMUSCULAR | Status: DC | PRN
  Administered 2016-07-16: 4 mg via INTRAVENOUS
  Filled 2016-07-14: qty 2

## 2016-07-14 MED ORDER — TRANEXAMIC ACID 1000 MG/10ML IV SOLN
2000.0000 mg | Freq: Once | INTRAVENOUS | Status: DC
  Filled 2016-07-14: qty 20

## 2016-07-14 MED ORDER — TRANEXAMIC ACID 1000 MG/10ML IV SOLN
INTRAVENOUS | Status: DC | PRN
  Administered 2016-07-14: 2000 mg via TOPICAL

## 2016-07-14 MED ORDER — ACETAMINOPHEN 10 MG/ML IV SOLN
1000.0000 mg | Freq: Once | INTRAVENOUS | Status: AC
  Administered 2016-07-14: 1000 mg via INTRAVENOUS

## 2016-07-14 MED ORDER — MENTHOL 3 MG MT LOZG
1.0000 | LOZENGE | OROMUCOSAL | Status: DC | PRN
Start: 2016-07-14 — End: 2016-07-16

## 2016-07-14 MED ORDER — ACETAMINOPHEN 500 MG PO TABS
1000.0000 mg | ORAL_TABLET | Freq: Four times a day (QID) | ORAL | Status: AC
  Administered 2016-07-14 – 2016-07-15 (×4): 1000 mg via ORAL
  Filled 2016-07-14 (×4): qty 2

## 2016-07-14 MED ORDER — METOCLOPRAMIDE HCL 5 MG/ML IJ SOLN: 5.0000 mg | Freq: Three times a day (TID) | INTRAMUSCULAR | Status: DC | PRN

## 2016-07-14 MED ORDER — DEXAMETHASONE SODIUM PHOSPHATE 10 MG/ML IJ SOLN
INTRAMUSCULAR | Status: AC
  Filled 2016-07-14: qty 1

## 2016-07-14 MED ORDER — BUPIVACAINE LIPOSOME 1.3 % IJ SUSP
20.0000 mL | Freq: Once | INTRAMUSCULAR | Status: DC
  Filled 2016-07-14: qty 20

## 2016-07-14 MED ORDER — INSULIN ASPART 100 UNIT/ML ~~LOC~~ SOLN
0.0000 [IU] | Freq: Three times a day (TID) | SUBCUTANEOUS | Status: DC
  Administered 2016-07-14: 3 [IU] via SUBCUTANEOUS
  Administered 2016-07-15 – 2016-07-16 (×2): 2 [IU] via SUBCUTANEOUS
  Filled 2016-07-14: qty 1

## 2016-07-14 MED ORDER — DEXAMETHASONE SODIUM PHOSPHATE 10 MG/ML IJ SOLN: 10.0000 mg | Freq: Once | INTRAMUSCULAR | Status: DC

## 2016-07-14 MED ORDER — ONDANSETRON HCL 4 MG/2ML IJ SOLN
INTRAMUSCULAR | Status: DC | PRN
  Administered 2016-07-14: 4 mg via INTRAVENOUS

## 2016-07-14 MED ORDER — SODIUM CHLORIDE 0.9 % IJ SOLN
INTRAMUSCULAR | Status: DC | PRN
  Administered 2016-07-14: 30 mL

## 2016-07-14 MED ORDER — LIDOCAINE 2% (20 MG/ML) 5 ML SYRINGE
INTRAMUSCULAR | Status: AC
  Filled 2016-07-14: qty 5

## 2016-07-14 MED ORDER — ONDANSETRON HCL 4 MG/2ML IJ SOLN
INTRAMUSCULAR | Status: AC
  Filled 2016-07-14: qty 2

## 2016-07-14 MED ORDER — TRAMADOL HCL 50 MG PO TABS
50.0000 mg | ORAL_TABLET | Freq: Four times a day (QID) | ORAL | Status: DC | PRN
  Administered 2016-07-15: 100 mg via ORAL
  Filled 2016-07-14 (×2): qty 2

## 2016-07-14 MED ORDER — DEXAMETHASONE SODIUM PHOSPHATE 10 MG/ML IJ SOLN
10.0000 mg | Freq: Once | INTRAMUSCULAR | Status: AC
  Administered 2016-07-14: 10 mg via INTRAVENOUS

## 2016-07-14 MED ORDER — BUPIVACAINE IN DEXTROSE 0.75-8.25 % IT SOLN
INTRATHECAL | Status: DC | PRN
  Administered 2016-07-14: 1.8 mL via INTRATHECAL

## 2016-07-14 MED ORDER — FLEET ENEMA 7-19 GM/118ML RE ENEM: 1.0000 | ENEMA | Freq: Once | RECTAL | Status: DC | PRN

## 2016-07-14 MED ORDER — PROPOFOL 500 MG/50ML IV EMUL
INTRAVENOUS | Status: DC | PRN
  Administered 2016-07-14: 140 ug/kg/min via INTRAVENOUS

## 2016-07-14 MED ORDER — PROPOFOL 10 MG/ML IV BOLUS
INTRAVENOUS | Status: AC
  Filled 2016-07-14: qty 60

## 2016-07-14 MED ORDER — LOSARTAN POTASSIUM 25 MG PO TABS
25.0000 mg | ORAL_TABLET | Freq: Every day | ORAL | Status: DC
  Administered 2016-07-15 – 2016-07-16 (×2): 25 mg via ORAL
  Filled 2016-07-14 (×2): qty 1

## 2016-07-14 MED ORDER — METHOCARBAMOL 500 MG PO TABS
500.0000 mg | ORAL_TABLET | Freq: Four times a day (QID) | ORAL | Status: DC | PRN
  Administered 2016-07-14 – 2016-07-16 (×5): 500 mg via ORAL
  Filled 2016-07-14 (×5): qty 1

## 2016-07-14 MED ORDER — BUPIVACAINE LIPOSOME 1.3 % IJ SUSP
INTRAMUSCULAR | Status: DC | PRN
  Administered 2016-07-14: 20 mL

## 2016-07-14 MED ORDER — ROPIVACAINE HCL 7.5 MG/ML IJ SOLN
INTRAMUSCULAR | Status: AC
  Filled 2016-07-14: qty 20

## 2016-07-14 MED ORDER — SODIUM CHLORIDE 0.9 % IV SOLN
INTRAVENOUS | Status: DC
  Administered 2016-07-14: 17:00:00 via INTRAVENOUS

## 2016-07-14 MED ORDER — CEFAZOLIN SODIUM-DEXTROSE 2-4 GM/100ML-% IV SOLN
2.0000 g | INTRAVENOUS | Status: AC
  Administered 2016-07-14: 2 g via INTRAVENOUS
  Filled 2016-07-14: qty 100

## 2016-07-14 MED ORDER — PROPOFOL 10 MG/ML IV BOLUS
INTRAVENOUS | Status: DC | PRN
  Administered 2016-07-14: 20 mg via INTRAVENOUS

## 2016-07-14 MED ORDER — HYDROMORPHONE HCL 2 MG/ML IJ SOLN: 0.2500 mg | INTRAMUSCULAR | Status: DC | PRN

## 2016-07-14 MED ORDER — RIVAROXABAN 10 MG PO TABS
10.0000 mg | ORAL_TABLET | Freq: Every day | ORAL | Status: DC
  Administered 2016-07-15 – 2016-07-16 (×2): 10 mg via ORAL
  Filled 2016-07-14 (×2): qty 1

## 2016-07-14 MED ORDER — MIDAZOLAM HCL 2 MG/2ML IJ SOLN
INTRAMUSCULAR | Status: AC
  Filled 2016-07-14: qty 2

## 2016-07-14 MED ORDER — ACETAMINOPHEN 10 MG/ML IV SOLN
INTRAVENOUS | Status: AC
Start: 2016-07-14 — End: 2016-07-14
  Filled 2016-07-14: qty 100

## 2016-07-14 MED ORDER — LINAGLIPTIN 5 MG PO TABS
5.0000 mg | ORAL_TABLET | Freq: Every day | ORAL | Status: DC
Start: 2016-07-15 — End: 2016-07-16
  Administered 2016-07-15 – 2016-07-16 (×2): 5 mg via ORAL
  Filled 2016-07-14 (×2): qty 1

## 2016-07-14 MED ORDER — ONDANSETRON HCL 4 MG PO TABS: 4.0000 mg | ORAL_TABLET | Freq: Four times a day (QID) | ORAL | Status: DC | PRN

## 2016-07-14 MED ORDER — FENTANYL CITRATE (PF) 100 MCG/2ML IJ SOLN
INTRAMUSCULAR | Status: DC | PRN
  Administered 2016-07-14: 100 ug via INTRAVENOUS

## 2016-07-14 MED ORDER — CEFAZOLIN SODIUM-DEXTROSE 2-4 GM/100ML-% IV SOLN
2.0000 g | Freq: Four times a day (QID) | INTRAVENOUS | Status: AC
  Administered 2016-07-14 – 2016-07-15 (×2): 2 g via INTRAVENOUS
  Filled 2016-07-14: qty 100

## 2016-07-14 MED ORDER — BUPIVACAINE HCL 0.25 % IJ SOLN
INTRAMUSCULAR | Status: DC | PRN
  Administered 2016-07-14: 20 mL

## 2016-07-14 MED ORDER — DOCUSATE SODIUM 100 MG PO CAPS
100.0000 mg | ORAL_CAPSULE | Freq: Two times a day (BID) | ORAL | Status: DC
  Administered 2016-07-14 – 2016-07-16 (×4): 100 mg via ORAL
  Filled 2016-07-14 (×4): qty 1

## 2016-07-14 MED ORDER — BUPIVACAINE LIPOSOME 1.3 % IJ SUSP: 20.0000 mL | Freq: Once | INTRAMUSCULAR | Status: DC

## 2016-07-14 MED ORDER — FENTANYL CITRATE (PF) 100 MCG/2ML IJ SOLN
50.0000 ug | Freq: Once | INTRAMUSCULAR | Status: AC
  Administered 2016-07-14: 50 ug via INTRAVENOUS

## 2016-07-14 MED ORDER — SODIUM CHLORIDE 0.9 % IJ SOLN
INTRAMUSCULAR | Status: AC
Start: 2016-07-14 — End: 2016-07-14
  Filled 2016-07-14: qty 50

## 2016-07-14 MED ORDER — METOPROLOL TARTRATE 25 MG PO TABS
25.0000 mg | ORAL_TABLET | Freq: Two times a day (BID) | ORAL | Status: DC
  Administered 2016-07-14 – 2016-07-16 (×4): 25 mg via ORAL
  Filled 2016-07-14 (×4): qty 1

## 2016-07-14 MED ORDER — ATORVASTATIN CALCIUM 20 MG PO TABS
80.0000 mg | ORAL_TABLET | Freq: Every day | ORAL | Status: DC
  Administered 2016-07-15 – 2016-07-16 (×2): 80 mg via ORAL
  Filled 2016-07-14 (×2): qty 4

## 2016-07-14 MED ORDER — DIPHENHYDRAMINE HCL 12.5 MG/5ML PO ELIX: 12.5000 mg | ORAL_SOLUTION | ORAL | Status: DC | PRN

## 2016-07-14 MED ORDER — MORPHINE SULFATE (PF) 2 MG/ML IV SOLN
1.0000 mg | INTRAVENOUS | Status: DC | PRN
  Administered 2016-07-14 – 2016-07-15 (×3): 1 mg via INTRAVENOUS
  Filled 2016-07-14 (×3): qty 1

## 2016-07-14 MED ORDER — PROMETHAZINE HCL 25 MG/ML IJ SOLN: 6.2500 mg | INTRAMUSCULAR | Status: DC | PRN

## 2016-07-14 MED ORDER — METHOCARBAMOL 1000 MG/10ML IJ SOLN
500.0000 mg | Freq: Four times a day (QID) | INTRAVENOUS | Status: DC | PRN
  Filled 2016-07-14: qty 5

## 2016-07-14 MED ORDER — BISACODYL 10 MG RE SUPP: 10.0000 mg | Freq: Every day | RECTAL | Status: DC | PRN

## 2016-07-14 SURGICAL SUPPLY — 51 items
BAG DECANTER FOR FLEXI CONT (MISCELLANEOUS) ×2 IMPLANT
BAG ZIPLOCK 12X15 (MISCELLANEOUS) ×2 IMPLANT
BANDAGE ACE 6X5 VEL STRL LF (GAUZE/BANDAGES/DRESSINGS) ×2 IMPLANT
BANDAGE ELASTIC 6 VELCRO ST LF (GAUZE/BANDAGES/DRESSINGS) ×2 IMPLANT
BLADE SAG 18X100X1.27 (BLADE) ×2 IMPLANT
BLADE SAW SGTL 11.0X1.19X90.0M (BLADE) ×2 IMPLANT
BOWL SMART MIX CTS (DISPOSABLE) ×2 IMPLANT
CAPT KNEE TOTAL 3 ATTUNE ×2 IMPLANT
CEMENT HV SMART SET (Cement) ×4 IMPLANT
CLOTH BEACON ORANGE TIMEOUT ST (SAFETY) ×2 IMPLANT
CUFF TOURN SGL QUICK 34 (TOURNIQUET CUFF) ×1
CUFF TRNQT CYL 34X4X40X1 (TOURNIQUET CUFF) ×1 IMPLANT
DECANTER SPIKE VIAL GLASS SM (MISCELLANEOUS) ×2 IMPLANT
DRAPE U-SHAPE 47X51 STRL (DRAPES) ×2 IMPLANT
DRSG ADAPTIC 3X8 NADH LF (GAUZE/BANDAGES/DRESSINGS) ×2 IMPLANT
DRSG PAD ABDOMINAL 8X10 ST (GAUZE/BANDAGES/DRESSINGS) ×2 IMPLANT
DURAPREP 26ML APPLICATOR (WOUND CARE) ×2 IMPLANT
ELECT REM PT RETURN 9FT ADLT (ELECTROSURGICAL) ×2
ELECTRODE REM PT RTRN 9FT ADLT (ELECTROSURGICAL) ×1 IMPLANT
EVACUATOR 1/8 PVC DRAIN (DRAIN) ×2 IMPLANT
GAUZE SPONGE 4X4 12PLY STRL (GAUZE/BANDAGES/DRESSINGS) ×2 IMPLANT
GLOVE BIO SURGEON STRL SZ7.5 (GLOVE) IMPLANT
GLOVE BIO SURGEON STRL SZ8 (GLOVE) ×2 IMPLANT
GLOVE BIOGEL PI IND STRL 6.5 (GLOVE) IMPLANT
GLOVE BIOGEL PI IND STRL 8 (GLOVE) ×1 IMPLANT
GLOVE BIOGEL PI INDICATOR 6.5 (GLOVE)
GLOVE BIOGEL PI INDICATOR 8 (GLOVE) ×1
GLOVE SURG SS PI 6.5 STRL IVOR (GLOVE) IMPLANT
GOWN STRL REUS W/TWL LRG LVL3 (GOWN DISPOSABLE) ×2 IMPLANT
GOWN STRL REUS W/TWL XL LVL3 (GOWN DISPOSABLE) IMPLANT
HANDPIECE INTERPULSE COAX TIP (DISPOSABLE) ×1
IMMOBILIZER KNEE 20 (SOFTGOODS) ×2
IMMOBILIZER KNEE 20 THIGH 36 (SOFTGOODS) ×1 IMPLANT
MANIFOLD NEPTUNE II (INSTRUMENTS) ×2 IMPLANT
NS IRRIG 1000ML POUR BTL (IV SOLUTION) ×2 IMPLANT
PACK TOTAL KNEE CUSTOM (KITS) ×2 IMPLANT
PAD ABD 8X10 STRL (GAUZE/BANDAGES/DRESSINGS) ×2 IMPLANT
PADDING CAST COTTON 6X4 STRL (CAST SUPPLIES) ×2 IMPLANT
POSITIONER SURGICAL ARM (MISCELLANEOUS) ×2 IMPLANT
SET HNDPC FAN SPRY TIP SCT (DISPOSABLE) ×1 IMPLANT
STRIP CLOSURE SKIN 1/2X4 (GAUZE/BANDAGES/DRESSINGS) ×2 IMPLANT
SUT MNCRL AB 4-0 PS2 18 (SUTURE) ×2 IMPLANT
SUT VIC AB 2-0 CT1 27 (SUTURE) ×3
SUT VIC AB 2-0 CT1 TAPERPNT 27 (SUTURE) ×3 IMPLANT
SUT VLOC 180 0 24IN GS25 (SUTURE) ×2 IMPLANT
SYR 50ML LL SCALE MARK (SYRINGE) ×2 IMPLANT
TRAY FOLEY W/METER SILVER 16FR (SET/KITS/TRAYS/PACK) ×2 IMPLANT
WATER STERILE IRR 1000ML POUR (IV SOLUTION) ×4 IMPLANT
WATER STERILE IRR 1500ML POUR (IV SOLUTION) ×2 IMPLANT
WRAP KNEE MAXI GEL POST OP (GAUZE/BANDAGES/DRESSINGS) ×2 IMPLANT
YANKAUER SUCT BULB TIP 10FT TU (MISCELLANEOUS) ×2 IMPLANT

## 2016-07-14 NOTE — Progress Notes (Signed)
Assisted Dr. Rose with left, ultrasound guided, adductor canal block. Side rails up, monitors on throughout procedure. See vital signs in flow sheet. Tolerated Procedure well.  

## 2016-07-14 NOTE — Anesthesia Procedure Notes (Signed)
Anesthesia Regional Block:  Adductor canal block  Pre-Anesthetic Checklist: ,, timeout performed, Correct Patient, Correct Site, Correct Laterality, Correct Procedure, Correct Position, site marked, Risks and benefits discussed,  Surgical consent,  Pre-op evaluation,  At surgeon's request and post-op pain management  Laterality: Left  Prep: chloraprep       Needles:  Injection technique: Single-shot  Needle Type: Echogenic Needle     Needle Length: 9cm 9 cm Needle Gauge: 21 G    Additional Needles:  Procedures: ultrasound guided (picture in chart) Adductor canal block Narrative:  Start time: 07/14/2016 11:55 AM End time: 07/14/2016 12:58 PM Injection made incrementally with aspirations every 5 mL.  Performed by: Personally  Anesthesiologist: Jolinda Pinkstaff  Additional Notes: Patient tolerated the procedure well without complications

## 2016-07-14 NOTE — Anesthesia Postprocedure Evaluation (Signed)
Anesthesia Post Note  Patient: Joshua Roy  Procedure(s) Performed: Procedure(s) (LRB): LEFT TOTAL KNEE ARTHROPLASTY (Left)  Patient location during evaluation: PACU Anesthesia Type: Spinal Level of consciousness: oriented and awake and alert Pain management: pain level controlled Vital Signs Assessment: post-procedure vital signs reviewed and stable Respiratory status: spontaneous breathing, respiratory function stable and patient connected to nasal cannula oxygen Cardiovascular status: blood pressure returned to baseline and stable Postop Assessment: no headache and no backache Anesthetic complications: no       Last Vitals:  Vitals:   07/14/16 1515 07/14/16 1530  BP: 140/79 (!) 158/91  Pulse: (!) 58 (!) 59  Resp: 17 16  Temp:  36.5 C    Last Pain:  Vitals:   07/14/16 1515  TempSrc:   PainSc: 0-No pain    LLE Motor Response: Purposeful movement (07/14/16 1530) LLE Sensation: Tingling (07/14/16 1530) RLE Motor Response: Purposeful movement (07/14/16 1530) RLE Sensation: Tingling (07/14/16 1530) L Sensory Level: S1-Sole of foot, small toes (07/14/16 1530) R Sensory Level: S1-Sole of foot, small toes (07/14/16 1530)  Stacia Feazell S

## 2016-07-14 NOTE — Anesthesia Procedure Notes (Signed)
Spinal  Patient location during procedure: OR Start time: 07/14/2016 12:40 PM End time: 07/14/2016 12:44 PM Staffing Anesthesiologist: Kasten Leveque, Iona Beard Performed: anesthesiologist  Preanesthetic Checklist Completed: patient identified, site marked, surgical consent, pre-op evaluation, timeout performed, IV checked, risks and benefits discussed and monitors and equipment checked Spinal Block Patient position: sitting Prep: Betadine Patient monitoring: heart rate, continuous pulse ox and blood pressure Injection technique: single-shot Needle Needle type: Sprotte  Needle gauge: 24 G Needle length: 9 cm Additional Notes Expiration date of kit checked and confirmed. Patient tolerated procedure well, without complications.

## 2016-07-14 NOTE — Op Note (Signed)
OPERATIVE REPORT-TOTAL KNEE ARTHROPLASTY   Pre-operative diagnosis- Osteoarthritis  Left knee(s)  Post-operative diagnosis- Osteoarthritis Left knee(s)  Procedure-  Left  Total Knee Arthroplasty  Surgeon- Dione Plover. Brantleigh Mifflin, MD  Assistant- Arlee Muslim, PA-C   Anesthesia-  Spinal  EBL-* No blood loss amount entered *   Drains Hemovac  Tourniquet time- 39 minutes @ 294 mm Hg   Complications- None  Condition-PACU - hemodynamically stable.   Brief Clinical Note   Joshua Roy is a 65 y.o. year old male with end stage OA of his left knee with progressively worsening pain and dysfunction. He has constant pain, with activity and at rest and significant functional deficits with difficulties even with ADLs. He has had extensive non-op management including analgesics, injections of cortisone and viscosupplements, and home exercise program, but remains in significant pain with significant dysfunction. Radiographs show bone on bone arthritis medial and patellofemoral. He presents now for left Total Knee Arthroplasty.     Procedure in detail---   The patient is brought into the operating room and positioned supine on the operating table. After successful administration of  Spinal,   a tourniquet is placed high on the  Left thigh(s) and the lower extremity is prepped and draped in the usual sterile fashion. Time out is performed by the operating team and then the  Left lower extremity is wrapped in Esmarch, knee flexed and the tourniquet inflated to 300 mmHg.       A midline incision is made with a ten blade through the subcutaneous tissue to the level of the extensor mechanism. A fresh blade is used to make a medial parapatellar arthrotomy. Soft tissue over the proximal medial tibia is subperiosteally elevated to the joint line with a knife and into the semimembranosus bursa with a Cobb elevator. Soft tissue over the proximal lateral tibia is elevated with attention being paid to  avoiding the patellar tendon on the tibial tubercle. The patella is everted, knee flexed 90 degrees and the ACL and PCL are removed. Findings are bone on bone medial and patellofemoral with large global osteophytes.        The drill is used to create a starting hole in the distal femur and the canal is thoroughly irrigated with sterile saline to remove the fatty contents. The 5 degree Left  valgus alignment guide is placed into the femoral canal and the distal femoral cutting block is pinned to remove 9 mm off the distal femur. Resection is made with an oscillating saw.      The tibia is subluxed forward and the menisci are removed. The extramedullary alignment guide is placed referencing proximally at the medial aspect of the tibial tubercle and distally along the second metatarsal axis and tibial crest. The block is pinned to remove 19mm off the more deficient medial  side. Resection is made with an oscillating saw. Size 7is the most appropriate size for the tibia and the proximal tibia is prepared with the modular drill and keel punch for that size.      The femoral sizing guide is placed and size 8 is most appropriate. Rotation is marked off the epicondylar axis and confirmed by creating a rectangular flexion gap at 90 degrees. The size 8 cutting block is pinned in this rotation and the anterior, posterior and chamfer cuts are made with the oscillating saw. The intercondylar block is then placed and that cut is made.      Trial size 7 tibial component, trial size 8 posterior  stabilized femur and a 6  mm posterior stabilized rotating platform insert trial is placed. Full extension is achieved with excellent varus/valgus and anterior/posterior balance throughout full range of motion. The patella is everted and thickness measured to be 27  mm. Free hand resection is taken to 15 mm, a 41 template is placed, lug holes are drilled, trial patella is placed, and it tracks normally. Osteophytes are removed off the  posterior femur with the trial in place. All trials are removed and the cut bone surfaces prepared with pulsatile lavage. Cement is mixed and once ready for implantation, the size 7 tibial implant, size  8 posterior stabilized femoral component, and the size 41 patella are cemented in place and the patella is held with the clamp. The trial insert is placed and the knee held in full extension. The Exparel (20 ml mixed with 30 ml saline) and .25% Bupivicaine, are injected into the extensor mechanism, posterior capsule, medial and lateral gutters and subcutaneous tissues.  All extruded cement is removed and once the cement is hard the permanent 6 mm posterior stabilized rotating platform insert is placed into the tibial tray.      The wound is copiously irrigated with saline solution and the extensor mechanism closed over a hemovac drain with #1 V-loc suture. The tourniquet is released for a total tourniquet time of 39  minutes. Flexion against gravity is 140 degrees and the patella tracks normally. Subcutaneous tissue is closed with 2.0 vicryl and subcuticular with running 4.0 Monocryl. The incision is cleaned and dried and steri-strips and a bulky sterile dressing are applied. The limb is placed into a knee immobilizer and the patient is awakened and transported to recovery in stable condition.      Please note that a surgical assistant was a medical necessity for this procedure in order to perform it in a safe and expeditious manner. Surgical assistant was necessary to retract the ligaments and vital neurovascular structures to prevent injury to them and also necessary for proper positioning of the limb to allow for anatomic placement of the prosthesis.   Dione Plover Ubaldo Daywalt, MD    07/14/2016, 1:45 PM

## 2016-07-14 NOTE — Progress Notes (Signed)
Orthopedic Tech Progress Note Patient Details:  Joshua Roy Oct 08, 1950 [id number]  OHF w/ Trapeze CPM Left Knee CPM Left Knee: On Left Knee Flexion (Degrees): 40 Left Knee Extension (Degrees): 10   Charlott Rakes 07/14/2016, 3:35 PM

## 2016-07-14 NOTE — Interval H&P Note (Signed)
History and Physical Interval Note:  07/14/2016 12:25 PM  Joshua Roy  has presented today for surgery, with the diagnosis of LEFT KNEE OA  The various methods of treatment have been discussed with the patient and family. After consideration of risks, benefits and other options for treatment, the patient has consented to  Procedure(s): LEFT TOTAL KNEE ARTHROPLASTY (Left) as a surgical intervention .  The patient's history has been reviewed, patient examined, no change in status, stable for surgery.  I have reviewed the patient's chart and labs.  Questions were answered to the patient's satisfaction.     Gearlean Alf

## 2016-07-14 NOTE — Transfer of Care (Signed)
Immediate Anesthesia Transfer of Care Note  Patient: Joshua Roy  Procedure(s) Performed: Procedure(s): LEFT TOTAL KNEE ARTHROPLASTY (Left)  Patient Location: PACU  Anesthesia Type:Spinal  Level of Consciousness: awake, alert  and oriented  Airway & Oxygen Therapy: Patient Spontanous Breathing and Patient connected to face mask oxygen  Post-op Assessment: Report given to RN and Post -op Vital signs reviewed and stable  Post vital signs: Reviewed and stable  Last Vitals:  Vitals:   07/14/16 1220 07/14/16 1225  BP:    Pulse: (!) 58 (!) 58  Resp:    Temp:      Last Pain:  Vitals:   07/14/16 0922  TempSrc: Oral         Complications: No apparent anesthesia complications

## 2016-07-14 NOTE — H&P (View-Only) (Signed)
Joshua Roy DOB: [birth year] Married / Language: English / Race: White Male Date of Admission:  07/14/2016 CC:  Left Knee Pain History of Present Illness The patient is a 65 year old male who comes in for a preoperative History and Physical. The patient is scheduled for a left total knee arthroplasty to be performed by Dr. Dione Plover. Aluisio, MD at [facility] on 07-14-2016. The patient is a 65 year old male who presents today for follow up of their knee. The patient is being followed for their right knee pain and osteoarthritis. Symptoms reported include: pain, pain with weightbearing, difficulty ambulating, difficulty arising from a kneeling position and difficulty arising from chair. The patient feels that they are doing poorly and report their pain level to be moderate to severe. Current treatment includes: glucosamine. The following medication has been used for pain control: Oxycodone. Unfortunately, both knees are getting progressively worse. He is self employed and says it is getting harder for him to do the activities that he desires. The knee is hurting at all times. It is limiting what he can and cannot do. He states that the pain is starting to occur at night now also. He does not get any swelling. The knee does not give out on him. Has not locked on him. He is not having any associated hip pain. He is not having lower extremity weakness or paresthesia with it. AP and lateral of the knees show that he has bone on bone arthritis medial and patellofemoral both knees, worse on the left than the right. At this point, the most predictable means of improving pain and function is total knee arthroplasty. He got advanced arthritic change both knees, left more symptomatic than the right and left worse radiographically. He is at a stage now where the knee has essentially taken over his life. Long term the only anything is going to make him better would be a total knee arthroplasty. He  would like to proceed with surgery. They have been treated conservatively in the past for the above stated problem and despite conservative measures, they continue to have progressive pain and severe functional limitations and dysfunction. They have failed non-operative management including home exercise, medications, and injections. It is felt that they would benefit from undergoing total joint replacement. Risks and benefits of the procedure have been discussed with the patient and they elect to proceed with surgery. There are no active contraindications to surgery such as ongoing infection or rapidly progressive neurological disease.  Problem List/Past Medical  Hypercholesterolemia  Osteoarthritis  Diabetes Mellitus, Type II  Coronary artery disease  Primary osteoarthritis of left knee (M17.12)  Myocardial infarction  2001 Gastric Ulcer   Allergies NSAIDs  CANNOT USE NSAIDS DUE TO RECENT DX OF ULCER Altace *ANTIHYPERTENSIVES*  Cough.  Family History Diabetes Mellitus  First Degree Relatives. father and sister Heart Disease  father Heart disease in male family member before age 67  Hypertension  mother and father Rheumatoid Arthritis  sister and brother  Social History Previously in rehab  no Tobacco use  Current every day smoker. current every day smoker; smoke(d) 1 pack(s) per day Tobacco / smoke exposure  no Most recent primary occupation  Curator Alcohol use  current drinker; drinks hard liquor; only occasionally per week Pain Contract  no Number of flights of stairs before winded  2-3 Marital status  married Living situation  live with spouse Illicit drug use  no Exercise  Exercises weekly; does other Drug/Alcohol  Rehab (Currently)  no Current work status  working full time Children  3  Medication History Januvia (100MG  Tablet, Oral) Active. Glimepiride (2MG  Tablet, Oral) Active. Losartan Potassium (25MG  Tablet, Oral)  Active. Metoprolol Tartrate (25MG  Tablet, Oral) Active. Pantoprazole Sodium (40MG  Tablet DR, Oral) Active. Clopidogrel Bisulfate (75MG  Tablet, Oral) Active. Fenofibrate (160MG  Tablet, Oral) Active. MetFORMIN HCl (1000MG  Tablet, Oral) Active. Simvastatin (80MG  Tablet, Oral) Active.  Past Surgical History Vasectomy  Arthroscopy of Knee  left   Review of Systems General Not Present- Chills, Fatigue, Fever, Memory Loss, Night Sweats, Weight Gain and Weight Loss. Skin Not Present- Eczema, Hives, Itching, Lesions and Rash. HEENT Not Present- Dentures, Double Vision, Headache, Hearing Loss, Tinnitus and Visual Loss. Respiratory Present- Cough, Shortness of breath with exertion and Sputum Production (recent cold). Not Present- Allergies, Chronic Cough, Coughing up blood and Shortness of breath at rest. Cardiovascular Not Present- Chest Pain, Difficulty Breathing Lying Down, Murmur, Palpitations, Racing/skipping heartbeats and Swelling. Gastrointestinal Not Present- Abdominal Pain, Bloody Stool, Constipation, Diarrhea, Difficulty Swallowing, Heartburn, Jaundice, Loss of appetitie, Nausea and Vomiting. Male Genitourinary Present- Urinating at Night. Not Present- Blood in Urine, Discharge, Flank Pain, Incontinence, Painful Urination, Urgency, Urinary frequency, Urinary Retention and Weak urinary stream. Musculoskeletal Present- Joint Pain and Joint Swelling. Not Present- Back Pain, Morning Stiffness, Muscle Pain, Muscle Weakness and Spasms. Neurological Not Present- Blackout spells, Difficulty with balance, Dizziness, Paralysis, Tremor and Weakness. Psychiatric Present- Insomnia.  Vitals  Weight: 225 lb Height: 69in Weight was reported by patient. Height was reported by patient. Body Surface Area: 2.17 m Body Mass Index: 33.23 kg/m  Pulse: 60 (Regular)  BP: 146/72 (Sitting, Right Arm, Standard)  Physical Exam General Mental Status -Alert, cooperative and good  historian. General Appearance-pleasant, Not in acute distress. Orientation-Oriented X3. Build & Nutrition-Well nourished and Well developed.  Head and Neck Head-normocephalic, atraumatic . Neck Global Assessment - supple, no bruit auscultated on the right, no bruit auscultated on the left.  Eye Pupil - Bilateral-Regular and Round. Motion - Bilateral-EOMI.  Chest and Lung Exam Auscultation Breath sounds - clear at anterior chest wall and clear at posterior chest wall. Adventitious sounds - No Adventitious sounds.  Cardiovascular Auscultation Rhythm - Regular rate and rhythm. Heart Sounds - S1 WNL and S2 WNL. Murmurs & Other Heart Sounds - Auscultation of the heart reveals - No Murmurs.  Abdomen Palpation/Percussion Tenderness - Abdomen is non-tender to palpation. Rigidity (guarding) - Abdomen is soft. Auscultation Auscultation of the abdomen reveals - Bowel sounds normal.  Male Genitourinary Note: Not done, not pertinent to present illness   Musculoskeletal Note: Well developed male in no distress. Evaluations of his hips show normal range of motion, no discomfort. Left knee shows no effusion. Range about 10 to 125, marked crepitus on range of motion with tenderness medial greater than lateral. No instability. His right knee shows no effusion. Range is about 5 to 120. Marked crepitus on range of motion. Tender medial greater than lateral, no instability. There is no deformity on the right.  RADIOGRAPHS AP and lateral of the knees show that he has bone on bone arthritis medial and patellofemoral both knees, worse on the left than the right.  Assessment & Plan Primary osteoarthritis of right knee (M17.11) Primary osteoarthritis of left knee (M17.12)  Note:Surgical Plans: Left Right Total Knee Hip Replacement - Anterior Approach  Disposition: Home with wife  Cards: Dr. Johnny Bridge - Patient has been seen preoperatively and felt to be stable for surgery.  "Needs to stop Plavix 5  days before the procedure."  Topical TXA - CAD  Anesthesia Issues: Nausea when he was a teenager  Signed electronically by Ok Edwards, III PA-C

## 2016-07-14 NOTE — Anesthesia Preprocedure Evaluation (Signed)
Anesthesia Evaluation  Patient identified by MRN, date of birth, ID band Patient awake    Reviewed: Allergy & Precautions, NPO status , Patient's Chart, lab work & pertinent test results  Airway Mallampati: II  TM Distance: >3 FB Neck ROM: Full    Dental no notable dental hx.    Pulmonary COPD, Current Smoker,    Pulmonary exam normal breath sounds clear to auscultation       Cardiovascular hypertension, + CAD  Normal cardiovascular exam Rhythm:Regular Rate:Normal     Neuro/Psych negative neurological ROS  negative psych ROS   GI/Hepatic negative GI ROS, Neg liver ROS,   Endo/Other  negative endocrine ROSdiabetes  Renal/GU negative Renal ROS  negative genitourinary   Musculoskeletal negative musculoskeletal ROS (+)   Abdominal   Peds negative pediatric ROS (+)  Hematology negative hematology ROS (+)   Anesthesia Other Findings   Reproductive/Obstetrics negative OB ROS                             Anesthesia Physical Anesthesia Plan  ASA: III  Anesthesia Plan: Spinal   Post-op Pain Management:    Induction: Intravenous  Airway Management Planned: Simple Face Mask  Additional Equipment:   Intra-op Plan:   Post-operative Plan:   Informed Consent: I have reviewed the patients History and Physical, chart, labs and discussed the procedure including the risks, benefits and alternatives for the proposed anesthesia with the patient or authorized representative who has indicated his/her understanding and acceptance.   Dental advisory given  Plan Discussed with: CRNA and Surgeon  Anesthesia Plan Comments:         Anesthesia Quick Evaluation

## 2016-07-15 ENCOUNTER — Encounter (HOSPITAL_COMMUNITY): Payer: Self-pay | Admitting: Orthopedic Surgery

## 2016-07-15 LAB — BASIC METABOLIC PANEL
Anion gap: 6 (ref 5–15)
BUN: 18 mg/dL (ref 6–20)
CHLORIDE: 101 mmol/L (ref 101–111)
CO2: 29 mmol/L (ref 22–32)
CREATININE: 1.02 mg/dL (ref 0.61–1.24)
Calcium: 8.6 mg/dL — ABNORMAL LOW (ref 8.9–10.3)
GFR calc non Af Amer: >60 mL/min (ref >60)
Glucose, Bld: 193 mg/dL — ABNORMAL HIGH (ref 65–99)
Potassium: 4.6 mmol/L (ref 3.5–5.1)
Sodium: 136 mmol/L (ref 135–145)

## 2016-07-15 LAB — CBC
HEMATOCRIT: 43 % (ref 39.0–52.0)
Hemoglobin: 14.4 g/dL (ref 13.0–17.0)
MCH: 26.9 pg (ref 26.0–34.0)
MCHC: 33.5 g/dL (ref 30.0–36.0)
MCV: 80.2 fL (ref 78.0–100.0)
Platelets: 239 10*3/uL (ref 150–400)
RBC: 5.36 MIL/uL (ref 4.22–5.81)
RDW: 17.6 % — ABNORMAL HIGH (ref 11.5–15.5)
WBC: 16.3 10*3/uL — ABNORMAL HIGH (ref 4.0–10.5)

## 2016-07-15 LAB — GLUCOSE, CAPILLARY
GLUCOSE-CAPILLARY: 168 mg/dL — AB (ref 65–99)
GLUCOSE-CAPILLARY: 147 mg/dL — AB (ref 65–99)
GLUCOSE-CAPILLARY: 104 mg/dL — AB (ref 65–99)
Glucose-Capillary: 148 mg/dL — ABNORMAL HIGH (ref 65–99)

## 2016-07-15 MED ORDER — HYDROMORPHONE HCL 1 MG/ML IJ SOLN
1.0000 mg | INTRAMUSCULAR | Status: DC | PRN
  Administered 2016-07-15 (×3): 1 mg via INTRAVENOUS
  Filled 2016-07-15 (×4): qty 1

## 2016-07-15 MED ORDER — RIVAROXABAN 10 MG PO TABS: 10.0000 mg | ORAL_TABLET | Freq: Every day | ORAL | 0 refills | Status: DC

## 2016-07-15 MED ORDER — OXYCODONE HCL 5 MG PO TABS
10.0000 mg | ORAL_TABLET | ORAL | Status: DC | PRN
  Administered 2016-07-15 – 2016-07-16 (×5): 20 mg via ORAL
  Filled 2016-07-15 (×5): qty 4

## 2016-07-15 MED ORDER — TRAMADOL HCL 50 MG PO TABS: 50.0000 mg | ORAL_TABLET | Freq: Four times a day (QID) | ORAL | 1 refills | Status: DC | PRN

## 2016-07-15 MED ORDER — OXYCODONE HCL 10 MG PO TABS: 10.0000 mg | ORAL_TABLET | ORAL | 0 refills | Status: DC | PRN

## 2016-07-15 MED ORDER — METHOCARBAMOL 500 MG PO TABS: 500.0000 mg | ORAL_TABLET | Freq: Four times a day (QID) | ORAL | 0 refills | Status: DC | PRN

## 2016-07-15 NOTE — Evaluation (Signed)
Occupational Therapy Evaluation Patient Details Name: Joshua Roy MRN: [id number] DOB: Dec 03, 1950 Today's Date: 07/15/2016    History of Present Illness Pt is a 65 year old male s/p L TKA   Clinical Impression   Pt with decline in function and safety with ADLs and ADL mobility with decreased endurance and pain. Pt will have 24/7 assist and sup form his wife at home. Pt would benefit from acute OT services to address impairments to increase level of function and safety    Follow Up Recommendations  No OT follow up;Supervision - Intermittent    Equipment Recommendations  None recommended by OT    Recommendations for Other Services       Precautions / Restrictions Precautions Precautions: Fall;Knee Precaution Comments: reviewed no pillow under knee Required Braces or Orthoses: Knee Immobilizer - Left Knee Immobilizer - Left: Discontinue once straight leg raise with < 10 degree lag Restrictions Weight Bearing Restrictions: No Other Position/Activity Restrictions: WBAT      Mobility Bed Mobility               General bed mobility comments: pt up in recliner  Transfers Overall transfer level: Needs assistance Equipment used: Rolling walker (2 wheeled) Transfers: Sit to/from Stand Sit to Stand: Min guard              Balance Overall balance assessment: No apparent balance deficits (not formally assessed)                                          ADL Overall ADL's : Needs assistance/impaired     Grooming: Wash/dry hands;Wash/dry face;Standing;Set up;Supervision/safety   Upper Body Bathing: Set up   Lower Body Bathing: Moderate assistance   Upper Body Dressing : Set up   Lower Body Dressing: Moderate assistance   Toilet Transfer: Min guard;RW;Ambulation;Comfort height toilet;Grab bars   Toileting- Clothing Manipulation and Hygiene: Minimal assistance       Functional mobility during ADLs: Min guard General ADL  Comments: educated pt and his wife on ADL A/E for home use. Pt has reacher and LH shoe horn at home     Vision Vision Assessment?: No apparent visual deficits              Pertinent Vitals/Pain Pain Assessment: 0-10 Pain Score: 4  Pain Location: L knee Pain Descriptors / Indicators: Aching;Sore Pain Intervention(s): Monitored during session;Premedicated before session;Repositioned     Hand Dominance Right   Extremity/Trunk Assessment Upper Extremity Assessment Upper Extremity Assessment: Overall WFL for tasks assessed   Lower Extremity Assessment Lower Extremity Assessment: Defer to PT evaluation LLE Deficits / Details: unable to perform SLR, AAROM L knee flexion 50*   Cervical / Trunk Assessment Cervical / Trunk Assessment: Normal   Communication Communication Communication: No difficulties   Cognition Arousal/Alertness: Awake/alert Behavior During Therapy: WFL for tasks assessed/performed Overall Cognitive Status: Within Functional Limits for tasks assessed                     General Comments   Pt very pleasant and cooperative, wife very supportive                 Home Living Family/patient expects to be discharged to:: Private residence Living Arrangements: Spouse/significant other Available Help at Discharge: Family;Available 24 hours/day Type of Home: House Home Access: Stairs to enter [employer] of Steps: 5 Entrance  Stairs-Rails: Can reach both;Left;Right Home Layout: Able to live on main level with bedroom/bathroom     Bathroom Shower/Tub: Occupational psychologist: Standard     Home Equipment: Environmental consultant - 2 wheels;Bedside commode;Shower seat;Hand held shower head;Adaptive equipment Adaptive Equipment: Reacher;Long-handled shoe horn        Prior Functioning/Environment Level of Independence: Independent                 OT Problem List: Decreased activity tolerance;Pain   OT Treatment/Interventions:  Self-care/ADL training;DME and/or AE instruction;Therapeutic activities;Patient/family education    OT Goals(Current goals can be found in the care plan section) Acute Rehab OT Goals Patient Stated Goal: go home OT Goal Formulation: With patient/family Time For Goal Achievement: 07/22/16 Potential to Achieve Goals: Good ADL Goals Pt Will Perform Lower Body Bathing: with min assist;with caregiver independent in assisting;sitting/lateral leans;sit to/from stand Pt Will Perform Lower Body Dressing: with min assist;with caregiver independent in assisting;sitting/lateral leans;sit to/from stand Pt Will Transfer to Toilet: with supervision;ambulating Pt Will Perform Toileting - Clothing Manipulation and hygiene: with supervision;with min guard assist;sit to/from stand;with caregiver independent in assisting Pt Will Perform Tub/Shower Transfer: with min guard assist;with supervision;shower seat;ambulating;rolling walker;grab bars;3 in 1  OT Frequency: Min 2X/week   Barriers to D/C:    no barriers                     End of Session Equipment Utilized During Treatment: Rolling walker;Other (comment) (3 in 1) CPM Left Knee CPM Left Knee: Off  Activity Tolerance: Patient tolerated treatment well Patient left: in chair;with call bell/phone within reach;with family/visitor present   Time: 2482-5003 OT Time Calculation (min): 29 min Charges:  OT General Charges $OT Visit: 1 Procedure OT Evaluation $OT Eval Moderate Complexity: 1 Procedure OT Treatments $Therapeutic Activity: 8-22 mins G-Codes:    Britt Bottom 07/15/2016, 12:07 PM

## 2016-07-15 NOTE — Progress Notes (Signed)
   Subjective: 1 Day Post-Op Procedure(s) (LRB): LEFT TOTAL KNEE ARTHROPLASTY (Left) Patient reports pain as severe.   Patient seen in rounds for Dr. Wynelle Link this morning Patient is having problems with pain in the knee, requiring pain medications We will start therapy today.  Plan is to go Home after hospital stay.  Objective: Vital signs in last 24 hours: Temp:  [97.5 F (36.4 C)-98.4 F (36.9 C)] 97.5 F (36.4 C) (12/19 2141) Pulse Rate:  [61-77] 72 (12/19 2141) Resp:  [16-20] 18 (12/19 2141) BP: (142-165)/(51-90) 160/72 (12/19 2141) SpO2:  [92 %-97 %] 95 % (12/19 2141)  Intake/Output from previous day:  Intake/Output Summary (Last 24 hours) at 07/15/16 2202 Last data filed at 07/15/16 2141  Gross per 24 hour  Intake          2638.34 ml  Output             2445 ml  Net           193.34 ml    Intake/Output this shift: Total I/O In: 60 [P.O.:60] Out: 200 [Urine:200]  Labs:  Recent Labs  07/15/16 0403  HGB 14.4    Recent Labs  07/15/16 0403  WBC 16.3*  RBC 5.36  HCT 43.0  PLT 239    Recent Labs  07/15/16 0403  NA 136  K 4.6  CL 101  CO2 29  BUN 18  CREATININE 1.02  GLUCOSE 193*  CALCIUM 8.6*   No results for input(s): LABPT, INR in the last 72 hours.  EXAM General - Patient is Alert, Appropriate and Oriented Extremity - Neurovascular intact Sensation intact distally Intact pulses distally Dorsiflexion/Plantar flexion intact Dressing - dressing C/D/I Motor Function - intact, moving foot and toes well on exam.  Hemovac pulled without difficulty.  Past Medical History:  Diagnosis Date  . ACE-inhibitor cough   . Anxiety   . Bronchitis 06/27/2016  . COPD (chronic obstructive pulmonary disease) (HCC)    cath, mild inf. hypokinesis EF 49%, 100% ROA  . Coronary artery disease 2001  . Coronary atherosclerosis   . Diabetes mellitus type II   . Hyperlipidemia   . Hypertension   . OA (osteoarthritis)    knee OA, injected 2012 by ortho  .  Pneumonia     Assessment/Plan: 1 Day Post-Op Procedure(s) (LRB): LEFT TOTAL KNEE ARTHROPLASTY (Left) Principal Problem:   OA (osteoarthritis) of knee  Estimated body mass index is 33.29 kg/m as calculated from the following:   Height as of this encounter: 5\' 10"  (1.778 m).   Weight as of this encounter: 105.2 kg (232 lb). Up with therapy Plan for discharge tomorrow Discharge home -straight to outpatient therapy  DVT Prophylaxis - Xarelto Weight-Bearing as tolerated to left leg D/C O2 and Pulse OX and try on Room Air  Arlee Muslim, PA-C Orthopaedic Surgery 07/15/2016, 10:02 PM

## 2016-07-15 NOTE — Care Management Note (Signed)
Case Management Note  Patient Details  Name: Joshua Roy MRN: [id number] Date of Birth: 04/03/1951  Subjective/Objective:                  Left  Total Knee Arthroplasty Action/Plan: Discharge planning Expected Discharge Date:  07/16/16               Expected Discharge Plan:  Home/Self Care  In-House Referral:     Discharge planning Services  CM Consult  Post Acute Care Choice:  NA Choice offered to:  Patient  DME Arranged:  N/A DME Agency:  NA  HH Arranged:  NA HH Agency:  NA  Status of Service:  Completed, signed off  If discussed at [place] of Stay Meetings, dates discussed:    Additional Comments: CM met with pt in room to confirm plan is for outpt PT; pt confirms.  Pt states he has all DME needed at home.  NO other CM needs were communicated. Dellie Catholic, RN 07/15/2016, 3:37 PM

## 2016-07-15 NOTE — Evaluation (Signed)
Physical Therapy Evaluation Patient Details Name: Joshua Roy MRN: [id number] DOB: 1951/06/05 Today's Date: 07/15/2016   History of Present Illness  Pt is a 65 year old male s/p L TKA  Clinical Impression  Pt is s/p TKA resulting in the deficits listed below (see PT Problem List).  Pt will benefit from skilled PT to increase their independence and safety with mobility to allow discharge to the venue listed below.  Pt assisted with ambulating in hallway and performing LE exercises POD #1.  Pt plans to d/c home with assist from spouse.     Follow Up Recommendations Home health PT    Equipment Recommendations  None recommended by PT    Recommendations for Other Services       Precautions / Restrictions Precautions Precautions: Fall;Knee Precaution Comments: reviewed no pillow under knee Required Braces or Orthoses: Knee Immobilizer - Left Knee Immobilizer - Left: Discontinue once straight leg raise with < 10 degree lag Restrictions Weight Bearing Restrictions: No Other Position/Activity Restrictions: WBAT      Mobility  Bed Mobility Overal bed mobility: Needs Assistance Bed Mobility: Supine to Sit     Supine to sit: Supervision;HOB elevated     General bed mobility comments: pt self assisted L LE with R LE  Transfers Overall transfer level: Needs assistance Equipment used: Rolling walker (2 wheeled) Transfers: Sit to/from Stand Sit to Stand: Min guard         General transfer comment: verbal cues for UE and LE positioning  Ambulation/Gait Ambulation/Gait assistance: Min guard Ambulation Distance (Feet): 120 Feet Assistive device: Rolling walker (2 wheeled) Gait Pattern/deviations: Step-through pattern;Decreased stride length;Decreased stance time - left     General Gait Details: verbal cues for step length, RW positioning, posture  Stairs            Wheelchair Mobility    Modified Rankin (Stroke Patients Only)       Balance Overall  balance assessment: No apparent balance deficits (not formally assessed)                                           Pertinent Vitals/Pain Pain Assessment: 0-10 Pain Score: 4  Pain Location: L knee Pain Descriptors / Indicators: Aching;Sore Pain Intervention(s): Monitored during session;Premedicated before session;Repositioned    Home Living Family/patient expects to be discharged to:: Private residence Living Arrangements: Spouse/significant other Available Help at Discharge: Family;Available 24 hours/day Type of Home: House Home Access: Stairs to enter Entrance Stairs-Rails: Can reach both;Left;Right Entrance Stairs-Number of Steps: 5 Home Layout: Able to live on main level with bedroom/bathroom Home Equipment: Walker - 2 wheels;Bedside commode;Shower seat;Hand held shower head;Adaptive equipment      Prior Function Level of Independence: Independent               Hand Dominance   Dominant Hand: Right    Extremity/Trunk Assessment   Upper Extremity Assessment Upper Extremity Assessment: Overall WFL for tasks assessed    Lower Extremity Assessment Lower Extremity Assessment: Defer to PT evaluation LLE Deficits / Details: unable to perform SLR, AAROM L knee flexion 50*    Cervical / Trunk Assessment Cervical / Trunk Assessment: Normal  Communication   Communication: No difficulties  Cognition Arousal/Alertness: Awake/alert Behavior During Therapy: WFL for tasks assessed/performed Overall Cognitive Status: Within Functional Limits for tasks assessed  General Comments      Exercises Total Joint Exercises Ankle Circles/Pumps: AROM;10 reps;Both Quad Sets: AROM;Left;10 reps Short Arc QuadSinclair Ship;Left;10 reps Heel Slides: AAROM;Left;10 reps Hip ABduction/ADduction: AROM;Left;10 reps Straight Leg Raises: AAROM;Left;10 reps   Assessment/Plan    PT Assessment Patient needs continued PT services  PT Problem List  Decreased strength;Decreased activity tolerance;Decreased range of motion;Decreased mobility;Pain;Decreased knowledge of precautions;Decreased knowledge of use of DME          PT Treatment Interventions DME instruction;Gait training;Functional mobility training;Stair training;Therapeutic exercise;Therapeutic activities;Patient/family education    PT Goals (Current goals can be found in the Care Plan section)  Acute Rehab PT Goals Patient Stated Goal: go home PT Goal Formulation: With patient Time For Goal Achievement: 07/18/16 Potential to Achieve Goals: Good    Frequency Min 3X/week   Barriers to discharge        Co-evaluation               End of Session Equipment Utilized During Treatment: Gait belt;Left knee immobilizer Activity Tolerance: Patient tolerated treatment well Patient left: in chair;with call bell/phone within reach;with family/visitor present           Time: 1051-1107 PT Time Calculation (min) (ACUTE ONLY): 16 min   Charges:   PT Evaluation $PT Eval Low Complexity: 1 Procedure     PT G Codes:        Alexias Margerum,KATHrine E 07/15/2016, 12:16 PM Carmelia Bake, PT, DPT 07/15/2016 Pager: [phone]

## 2016-07-15 NOTE — Discharge Summary (Signed)
Physician Discharge Summary   Patient ID: Joshua Roy MRN: [id number] DOB/AGE: 1951-05-24 65 y.o.  Admit date: 07/14/2016 Discharge date: 07/16/2016  Primary Diagnosis:  Osteoarthritis  Left knee(s)  Admission Diagnoses:  Past Medical History:  Diagnosis Date  . ACE-inhibitor cough   . Anxiety   . Bronchitis 06/27/2016  . COPD (chronic obstructive pulmonary disease) (HCC)    cath, mild inf. hypokinesis EF 49%, 100% ROA  . Coronary artery disease 2001  . Coronary atherosclerosis   . Diabetes mellitus type II   . Hyperlipidemia   . Hypertension   . OA (osteoarthritis)    knee OA, injected 2012 by ortho  . Pneumonia    Discharge Diagnoses:   Principal Problem:   OA (osteoarthritis) of knee  Estimated body mass index is 33.29 kg/m as calculated from the following:   Height as of this encounter: 5' 10"  (1.778 m).   Weight as of this encounter: 105.2 kg (232 lb).  Procedure:  Procedure(s) (LRB): LEFT TOTAL KNEE ARTHROPLASTY (Left)   Consults: None  HPI: Joshua Roy is a 65 y.o. year old male with end stage OA of his left knee with progressively worsening pain and dysfunction. He has constant pain, with activity and at rest and significant functional deficits with difficulties even with ADLs. He has had extensive non-op management including analgesics, injections of cortisone and viscosupplements, and home exercise program, but remains in significant pain with significant dysfunction. Radiographs show bone on bone arthritis medial and patellofemoral. He presents now for left Total Knee Arthroplasty.   Laboratory Data: Admission on 07/14/2016  Component Date Value Ref Range Status  . Glucose-Capillary 07/14/2016 137* 65 - 99 mg/dL Final  . Glucose-Capillary 07/14/2016 117* 65 - 99 mg/dL Final  . Glucose-Capillary 07/14/2016 158* 65 - 99 mg/dL Final  . WBC 07/15/2016 16.3* 4.0 - 10.5 K/uL Final  . RBC 07/15/2016 5.36  4.22 - 5.81 MIL/uL Final  .  Hemoglobin 07/15/2016 14.4  13.0 - 17.0 g/dL Final  . HCT 07/15/2016 43.0  39.0 - 52.0 % Final  . MCV 07/15/2016 80.2  78.0 - 100.0 fL Final  . MCH 07/15/2016 26.9  26.0 - 34.0 pg Final  . MCHC 07/15/2016 33.5  30.0 - 36.0 g/dL Final  . RDW 07/15/2016 17.6* 11.5 - 15.5 % Final  . Platelets 07/15/2016 239  150 - 400 K/uL Final  . Sodium 07/15/2016 136  135 - 145 mmol/L Final  . Potassium 07/15/2016 4.6  3.5 - 5.1 mmol/L Final  . Chloride 07/15/2016 101  101 - 111 mmol/L Final  . CO2 07/15/2016 29  22 - 32 mmol/L Final  . Glucose, Bld 07/15/2016 193* 65 - 99 mg/dL Final  . BUN 07/15/2016 18  6 - 20 mg/dL Final  . Creatinine, Ser 07/15/2016 1.02  0.61 - 1.24 mg/dL Final  . Calcium 07/15/2016 8.6* 8.9 - 10.3 mg/dL Final  . GFR calc non Af Amer 07/15/2016 >60  >60 mL/min Final  . GFR calc Af Amer 07/15/2016 >60  >60 mL/min Final   Comment: (NOTE) The eGFR has been calculated using the CKD EPI equation. This calculation has not been validated in all clinical situations. eGFR's persistently <60 mL/min signify possible Chronic Kidney Disease.   . Anion gap 07/15/2016 6  5 - 15 Final  . Glucose-Capillary 07/14/2016 175* 65 - 99 mg/dL Final  . Comment 1 07/14/2016 Notify RN   Final  . Comment 2 07/14/2016 Document in Chart   Final  . Glucose-Capillary 07/15/2016  148* 65 - 99 mg/dL Final  . Comment 1 07/15/2016 Notify RN   Final  . Comment 2 07/15/2016 Document in Chart   Final  . Glucose-Capillary 07/15/2016 168* 65 - 99 mg/dL Final  . WBC 07/16/2016 14.7* 4.0 - 10.5 K/uL Final  . RBC 07/16/2016 5.48  4.22 - 5.81 MIL/uL Final  . Hemoglobin 07/16/2016 14.8  13.0 - 17.0 g/dL Final  . HCT 07/16/2016 42.9  39.0 - 52.0 % Final  . MCV 07/16/2016 78.3  78.0 - 100.0 fL Final  . MCH 07/16/2016 27.0  26.0 - 34.0 pg Final  . MCHC 07/16/2016 34.5  30.0 - 36.0 g/dL Final  . RDW 07/16/2016 17.2* 11.5 - 15.5 % Final  . Platelets 07/16/2016 203  150 - 400 K/uL Final  . Sodium 07/16/2016 133* 135 - 145  mmol/L Final  . Potassium 07/16/2016 4.1  3.5 - 5.1 mmol/L Final  . Chloride 07/16/2016 95* 101 - 111 mmol/L Final  . CO2 07/16/2016 28  22 - 32 mmol/L Final  . Glucose, Bld 07/16/2016 168* 65 - 99 mg/dL Final  . BUN 07/16/2016 15  6 - 20 mg/dL Final  . Creatinine, Ser 07/16/2016 1.04  0.61 - 1.24 mg/dL Final  . Calcium 07/16/2016 9.0  8.9 - 10.3 mg/dL Final  . GFR calc non Af Amer 07/16/2016 >60  >60 mL/min Final  . GFR calc Af Amer 07/16/2016 >60  >60 mL/min Final   Comment: (NOTE) The eGFR has been calculated using the CKD EPI equation. This calculation has not been validated in all clinical situations. eGFR's persistently <60 mL/min signify possible Chronic Kidney Disease.   . Anion gap 07/16/2016 10  5 - 15 Final  . Glucose-Capillary 07/15/2016 147* 65 - 99 mg/dL Final  . Glucose-Capillary 07/15/2016 104* 65 - 99 mg/dL Final  . Glucose-Capillary 07/16/2016 145* 65 - 99 mg/dL Final  Hospital Outpatient Visit on 07/08/2016  Component Date Value Ref Range Status  . Hgb A1c MFr Bld 07/09/2016 6.6* 4.8 - 5.6 % Final   Comment: (NOTE)         Pre-diabetes: 5.7 - 6.4         Diabetes: >6.4         Glycemic control for adults with diabetes: <7.0   . Mean Plasma Glucose 07/09/2016 143  mg/dL Final   Comment: (NOTE) Performed At: [facility] [place], [place] [id number] Lindon Romp MD VO:[id number]   . aPTT 07/08/2016 26  24 - 36 seconds Final  . WBC 07/08/2016 11.2* 4.0 - 10.5 K/uL Final  . RBC 07/08/2016 6.18* 4.22 - 5.81 MIL/uL Final  . Hemoglobin 07/08/2016 16.7  13.0 - 17.0 g/dL Final  . HCT 07/08/2016 50.5  39.0 - 52.0 % Final  . MCV 07/08/2016 81.7  78.0 - 100.0 fL Final  . MCH 07/08/2016 27.0  26.0 - 34.0 pg Final  . MCHC 07/08/2016 33.1  30.0 - 36.0 g/dL Final  . RDW 07/08/2016 18.5* 11.5 - 15.5 % Final  . Platelets 07/08/2016 283  150 - 400 K/uL Final  . Sodium 07/08/2016 140  135 - 145 mmol/L Final  . Potassium 07/08/2016 4.6  3.5 - 5.1  mmol/L Final  . Chloride 07/08/2016 104  101 - 111 mmol/L Final  . CO2 07/08/2016 30  22 - 32 mmol/L Final  . Glucose, Bld 07/08/2016 86  65 - 99 mg/dL Final  . BUN 07/08/2016 22* 6 - 20 mg/dL Final  . Creatinine, Ser 07/08/2016  1.17  0.61 - 1.24 mg/dL Final  . Calcium 07/08/2016 8.7* 8.9 - 10.3 mg/dL Final  . Total Protein 07/08/2016 6.6  6.5 - 8.1 g/dL Final  . Albumin 07/08/2016 3.9  3.5 - 5.0 g/dL Final  . AST 07/08/2016 19  15 - 41 U/L Final  . ALT 07/08/2016 21  17 - 63 U/L Final  . Alkaline Phosphatase 07/08/2016 25* 38 - 126 U/L Final  . Total Bilirubin 07/08/2016 0.6  0.3 - 1.2 mg/dL Final  . GFR calc non Af Amer 07/08/2016 >60  >60 mL/min Final  . GFR calc Af Amer 07/08/2016 >60  >60 mL/min Final   Comment: (NOTE) The eGFR has been calculated using the CKD EPI equation. This calculation has not been validated in all clinical situations. eGFR's persistently <60 mL/min signify possible Chronic Kidney Disease.   . Anion gap 07/08/2016 6  5 - 15 Final  . Prothrombin Time 07/08/2016 12.7  11.4 - 15.2 seconds Final  . INR 07/08/2016 0.95   Final  . ABO/RH(D) 07/14/2016 O NEG   Final  . Antibody Screen 07/14/2016 NEG   Final  . Sample Expiration 07/14/2016 07/17/2016   Final  . Extend sample reason 07/14/2016 NO TRANSFUSIONS OR PREGNANCY IN THE PAST 3 MONTHS   Final  . MRSA, PCR 07/08/2016 NEGATIVE  NEGATIVE Final  . Staphylococcus aureus 07/08/2016 POSITIVE* NEGATIVE Final   Comment:        The Xpert SA Assay (FDA approved for NASAL specimens in patients over 20 years of age), is one component of a comprehensive surveillance program.  Test performance has been validated by [facility] for patients greater than or equal to 83 year old. It is not intended to diagnose infection nor to guide or monitor treatment.   . Glucose-Capillary 07/08/2016 101* 65 - 99 mg/dL Final  . Color, Urine 07/08/2016 YELLOW  YELLOW Final  . APPearance 07/08/2016 CLEAR  CLEAR Final  .  Specific Gravity, Urine 07/08/2016 1.009  1.005 - 1.030 Final  . pH 07/08/2016 6.0  5.0 - 8.0 Final  . Glucose, UA 07/08/2016 NEGATIVE  NEGATIVE mg/dL Final  . Hgb urine dipstick 07/08/2016 NEGATIVE  NEGATIVE Final  . Bilirubin Urine 07/08/2016 NEGATIVE  NEGATIVE Final  . Ketones, ur 07/08/2016 NEGATIVE  NEGATIVE mg/dL Final  . Protein, ur 07/08/2016 NEGATIVE  NEGATIVE mg/dL Final  . Nitrite 07/08/2016 NEGATIVE  NEGATIVE Final  . Leukocytes, UA 07/08/2016 NEGATIVE  NEGATIVE Final  . ABO/RH(D) 07/08/2016 O NEG   Final     X-Rays:Dg Chest 2 View  Result Date: 07/02/2016 CLINICAL DATA:  Dry cough for 3 weeks, scheduled for knee surgery this month. Smoking history. EXAM: CHEST  2 VIEW COMPARISON:  Chest x-ray of 05/13/2013 FINDINGS: - no active infiltrate or effusion is seen. Mediastinal and hilar contours are unremarkable. The heart is within normal limits in size. No acute bony abnormality is seen. There are degenerative changes throughout the lower thoracic spine. IMPRESSION: No active cardiopulmonary disease. Electronically Signed   By: Ivar Drape M.D.   On: 07/02/2016 15:47    EKG: Orders placed or performed in visit on 08/15/15  . EKG 12-Lead     Hospital Course: Joshua Roy is a 65 y.o. who was admitted to [facility]. They were brought to the operating room on 07/14/2016 and underwent Procedure(s): LEFT TOTAL KNEE ARTHROPLASTY.  Patient tolerated the procedure well and was later transferred to the recovery room and then to the orthopaedic floor for postoperative care.  They were given PO and IV analgesics for pain control following their surgery.  They were given 24 hours of postoperative antibiotics of  Anti-infectives    Start     Dose/Rate Route Frequency Ordered Stop   07/14/16 1800  ceFAZolin (ANCEF) IVPB 2g/100 mL premix     2 g 200 mL/hr over 30 Minutes Intravenous Every 6 hours 07/14/16 1601 07/15/16 0036   07/14/16 0939  ceFAZolin (ANCEF) IVPB 2g/100 mL  premix     2 g 200 mL/hr over 30 Minutes Intravenous On call to O.R. 07/14/16 [id number] 07/14/16 1238     and started on DVT prophylaxis in the form of Xarelto.   PT and OT were ordered for total joint protocol.  Discharge planning consulted to help with postop disposition and equipment needs.  Patient had a topugh night on the evening of surgery.  They started to get up OOB with therapy on day one. Hemovac drain was pulled without difficulty.  Continued to work with therapy into day two.  Dressing was changed on day two and the incision was healing well.  Patient was seen in rounds and was ready to go home.  Discharge home - straight to outpatient Diet - Cardiac diet and Diabetic diet Follow up - in 2 weeks Activity - WBAT Disposition - Home Condition Upon Discharge - Good D/C Meds - See DC Summary DVT Prophylaxis - Xarelto   Discharge Instructions    Call MD / Call 911    Complete by:  As directed    If you experience chest pain or shortness of breath, CALL 911 and be transported to the hospital emergency room.  If you develope a fever above 101 F, pus (white drainage) or increased drainage or redness at the wound, or calf pain, call your surgeon's office.   Change dressing    Complete by:  As directed    Change dressing daily with sterile 4 x 4 inch gauze dressing and apply TED hose. Do not submerge the incision under water.   Constipation Prevention    Complete by:  As directed    Drink plenty of fluids.  Prune juice may be helpful.  You may use a stool softener, such as Colace (over the counter) 100 mg twice a day.  Use MiraLax (over the counter) for constipation as needed.   Diet - low sodium heart healthy    Complete by:  As directed    Discharge instructions    Complete by:  As directed    Pick up stool softner and laxative for home use following surgery while on pain medications. Do not submerge incision under water. Please use good hand washing techniques while changing dressing  each day. May shower starting three days after surgery. Please use a clean towel to pat the incision dry following showers. Continue to use ice for pain and swelling after surgery. Do not use any lotions or creams on the incision until instructed by your surgeon.   Postoperative Constipation Protocol  Constipation - defined medically as fewer than three stools per week and severe constipation as less than one stool per week.  One of the most common issues patients have following surgery is constipation.  Even if you have a regular bowel pattern at home, your normal regimen is likely to be disrupted due to multiple reasons following surgery.  Combination of anesthesia, postoperative narcotics, change in appetite and fluid intake all can affect your bowels.  In order to avoid complications following surgery, here  are some recommendations in order to help you during your recovery period.  Colace (docusate) - Pick up an over-the-counter form of Colace or another stool softener and take twice a day as long as you are requiring postoperative pain medications.  Take with a full glass of water daily.  If you experience loose stools or diarrhea, hold the colace until you stool forms back up.  If your symptoms do not get better within 1 week or if they get worse, check with your doctor.  Dulcolax (bisacodyl) - Pick up over-the-counter and take as directed by the product packaging as needed to assist with the movement of your bowels.  Take with a full glass of water.  Use this product as needed if not relieved by Colace only.   MiraLax (polyethylene glycol) - Pick up over-the-counter to have on hand.  MiraLax is a solution that will increase the amount of water in your bowels to assist with bowel movements.  Take as directed and can mix with a glass of water, juice, soda, coffee, or tea.  Take if you go more than two days without a movement. Do not use MiraLax more than once per day. Call your doctor if you  are still constipated or irregular after using this medication for 7 days in a row.  If you continue to have problems with postoperative constipation, please contact the office for further assistance and recommendations.  If you experience "the worst abdominal pain ever" or develop nausea or vomiting, please contact the office immediatly for further recommendations for treatment.   Take Xarelto for two and a half more weeks, then discontinue Xarelto. Once the patient has completed the Xarelto, they may resume the Plavix 75 mg daily.   Do not put a pillow under the knee. Place it under the heel.    Complete by:  As directed    Do not sit on low chairs, stoools or toilet seats, as it may be difficult to get up from low surfaces    Complete by:  As directed    Driving restrictions    Complete by:  As directed    No driving until released by the physician.   Increase activity slowly as tolerated    Complete by:  As directed    Lifting restrictions    Complete by:  As directed    No lifting until released by the physician.   Patient may shower    Complete by:  As directed    You may shower without a dressing once there is no drainage.  Do not wash over the wound.  If drainage remains, do not shower until drainage stops.   TED hose    Complete by:  As directed    Use stockings (TED hose) for 3 weeks on both leg(s).  You may remove them at night for sleeping.   Weight bearing as tolerated    Complete by:  As directed    Laterality:  left   Extremity:  Lower     Allergies as of 07/16/2016      Reactions   Mucinex D [pseudoephedrine-guaifenesin Er]    Elevated BP, insomnia   Ramipril    REACTION: Cough      Medication List    STOP taking these medications   ACCU-CHEK AVIVA PLUS w/Device Kit   azithromycin 250 MG tablet Commonly known as:  ZITHROMAX   clopidogrel 75 MG tablet Commonly known as:  PLAVIX   methylPREDNISolone 4 MG Tbpk tablet Commonly  known as:  MEDROL DOSEPAK    sildenafil 100 MG tablet Commonly known as:  VIAGRA     TAKE these medications   accu-chek multiclix lancets Use as instructed to check blood sugar once daily or as needed.  Diagnosis:  E11.9   Non insulin-dependent.   atorvastatin 80 MG tablet Commonly known as:  LIPITOR Take 1 tablet (80 mg total) by mouth daily.   BREO ELLIPTA 100-25 MCG/INH Aepb Generic drug:  fluticasone furoate-vilanterol Inhale 1 puff into the lungs daily.   ezetimibe 10 MG tablet Commonly known as:  ZETIA Take 1 tablet (10 mg total) by mouth daily.   fenofibrate 160 MG tablet Take 1 tablet (160 mg total) by mouth daily.   glimepiride 2 MG tablet Commonly known as:  AMARYL Take one half tablet (1 mg) by mouth each morning   glipiZIDE 10 MG 24 hr tablet Commonly known as:  GLUCOTROL XL Take 10 mg by mouth daily with breakfast.   glucose blood test strip Commonly known as:  ACCU-CHEK AVIVA PLUS Use to check blood sugar once daily or as needed.  Diagnosis:  E11.9   Non insulin-dependent.   losartan 25 MG tablet Commonly known as:  COZAAR Take 1 tablet (25 mg total) by mouth daily.   metFORMIN 1000 MG tablet Commonly known as:  GLUCOPHAGE Take 1 tablet (1,000 mg total) by mouth 2 (two) times daily.   methocarbamol 500 MG tablet Commonly known as:  ROBAXIN Take 1 tablet (500 mg total) by mouth every 6 (six) hours as needed for muscle spasms.   metoprolol tartrate 25 MG tablet Commonly known as:  LOPRESSOR Take 1 tablet (25 mg total) by mouth 2 (two) times daily.   nicotine 21 mg/24hr patch Commonly known as:  NICODERM CQ - dosed in mg/24 hours Place 21 mg onto the skin daily.   oxyCODONE 5 MG immediate release tablet Commonly known as:  Oxy IR/ROXICODONE Take 1-3 tablets (5-15 mg total) by mouth every 3 (three) hours as needed for moderate pain or severe pain.   pantoprazole 40 MG tablet Commonly known as:  PROTONIX Take 1 tablet (40 mg total) by mouth daily.   rivaroxaban 10 MG Tabs  tablet Commonly known as:  XARELTO Take 1 tablet (10 mg total) by mouth daily with breakfast. Take Xarelto for two and a half more weeks, then discontinue Xarelto. Once the patient has completed the Xarelto, they may resume the Plavix 75 mg daily.   sitaGLIPtin 100 MG tablet Commonly known as:  JANUVIA Take 1 tablet (100 mg total) by mouth daily.   traMADol 50 MG tablet Commonly known as:  ULTRAM Take 1-2 tablets (50-100 mg total) by mouth every 6 (six) hours as needed (mild pain).      Follow-up Information    Gearlean Alf, MD. Schedule an appointment as soon as possible for a visit on 07/29/2016.   Specialty:  Orthopedic Surgery Contact information: [address] [place] [address] [phone]           Signed: Arlee Muslim, PA-C Orthopaedic Surgery 07/16/2016, 7:19 AM

## 2016-07-15 NOTE — Discharge Instructions (Addendum)
° °Dr. Frank Aluisio °Total Joint Specialist °[facility] °[address] °[place], [place] [address] °[phone] ° °TOTAL KNEE REPLACEMENT POSTOPERATIVE DIRECTIONS ° °Knee Rehabilitation, Guidelines Following Surgery  °Results after knee surgery are often greatly improved when you follow the exercise, range of motion and muscle strengthening exercises prescribed by your doctor. Safety measures are also important to protect the knee from further injury. Any time any of these exercises cause you to have increased pain or swelling in your knee joint, decrease the amount until you are comfortable again and slowly increase them. If you have problems or questions, call your caregiver or physical therapist for advice.  ° °HOME CARE INSTRUCTIONS  °Remove items at home which could result in a fall. This includes throw rugs or furniture in walking pathways.  °· ICE to the affected knee every three hours for 30 minutes at a time and then as needed for pain and swelling.  Continue to use ice on the knee for pain and swelling from surgery. You may notice swelling that will progress down to the foot and ankle.  This is normal after surgery.  Elevate the leg when you are not up walking on it.   °· Continue to use the breathing machine which will help keep your temperature down.  It is common for your temperature to cycle up and down following surgery, especially at night when you are not up moving around and exerting yourself.  The breathing machine keeps your lungs expanded and your temperature down. °· Do not place pillow under knee, focus on keeping the knee straight while resting ° °DIET °You may resume your previous home diet once your are discharged from the hospital. ° °DRESSING / WOUND CARE / SHOWERING °You may shower 3 days after surgery, but keep the wounds dry during showering.  You may use an occlusive plastic wrap (Press'n Seal for example), NO SOAKING/SUBMERGING IN THE BATHTUB.  If the  bandage gets wet, change with a clean dry gauze.  If the incision gets wet, pat the wound dry with a clean towel. °You may start showering once you are discharged home but do not submerge the incision under water. Just pat the incision dry and apply a dry gauze dressing on daily. °Change the surgical dressing daily and reapply a dry dressing each time. ° °ACTIVITY °Walk with your walker as instructed. °Use walker as long as suggested by your caregivers. °Avoid periods of inactivity such as sitting longer than an hour when not asleep. This helps prevent blood clots.  °You may resume a sexual relationship in one month or when given the OK by your doctor.  °You may return to work once you are cleared by your doctor.  °Do not drive a car for 6 weeks or until released by you surgeon.  °Do not drive while taking narcotics. ° °WEIGHT BEARING °Weight bearing as tolerated with assist device (walker, cane, etc) as directed, use it as long as suggested by your surgeon or therapist, typically at least 4-6 weeks. ° °POSTOPERATIVE CONSTIPATION PROTOCOL °Constipation - defined medically as fewer than three stools per week and severe constipation as less than one stool per week. ° °One of the most common issues patients have following surgery is constipation.  Even if you have a regular bowel pattern at home, your normal regimen is likely to be disrupted due to multiple reasons following surgery.  Combination of anesthesia, postoperative narcotics, change in appetite and fluid intake all can affect your bowels.    In order to avoid complications following surgery, here are some recommendations in order to help you during your recovery period. ° °Colace (docusate) - Pick up an over-the-counter form of Colace or another stool softener and take twice a day as long as you are requiring postoperative pain medications.  Take with a full glass of water daily.  If you experience loose stools or diarrhea, hold the colace until you stool forms  back up.  If your symptoms do not get better within 1 week or if they get worse, check with your doctor. ° °Dulcolax (bisacodyl) - Pick up over-the-counter and take as directed by the product packaging as needed to assist with the movement of your bowels.  Take with a full glass of water.  Use this product as needed if not relieved by Colace only.  ° °MiraLax (polyethylene glycol) - Pick up over-the-counter to have on hand.  MiraLax is a solution that will increase the amount of water in your bowels to assist with bowel movements.  Take as directed and can mix with a glass of water, juice, soda, coffee, or tea.  Take if you go more than two days without a movement. °Do not use MiraLax more than once per day. Call your doctor if you are still constipated or irregular after using this medication for 7 days in a row. ° °If you continue to have problems with postoperative constipation, please contact the office for further assistance and recommendations.  If you experience "the worst abdominal pain ever" or develop nausea or vomiting, please contact the office immediatly for further recommendations for treatment. ° °ITCHING ° If you experience itching with your medications, try taking only a single pain pill, or even half a pain pill at a time.  You can also use Benadryl over the counter for itching or also to help with sleep.  ° °TED HOSE STOCKINGS °Wear the elastic stockings on both legs for three weeks following surgery during the day but you may remove then at night for sleeping. ° °MEDICATIONS °See your medication summary on the “After Visit Summary” that the nursing staff will review with you prior to discharge.  You may have some home medications which will be placed on hold until you complete the course of blood thinner medication.  It is important for you to complete the blood thinner medication as prescribed by your surgeon.  Continue your approved medications as instructed at time of  discharge. ° °PRECAUTIONS °If you experience chest pain or shortness of breath - call 911 immediately for transfer to the hospital emergency department.  °If you develop a fever greater that 101 F, purulent drainage from wound, increased redness or drainage from wound, foul odor from the wound/dressing, or calf pain - CONTACT YOUR SURGEON.   °                                                °FOLLOW-UP APPOINTMENTS °Make sure you keep all of your appointments after your operation with your surgeon and caregivers. You should call the office at the above phone number and make an appointment for approximately two weeks after the date of your surgery or on the date instructed by your surgeon outlined in the "After Visit Summary". ° ° °RANGE OF MOTION AND STRENGTHENING EXERCISES  °Rehabilitation of the knee is important following a knee injury or   an operation. After just a few days of immobilization, the muscles of the thigh which control the knee become weakened and shrink (atrophy). Knee exercises are designed to build up the tone and strength of the thigh muscles and to improve knee motion. Often times heat used for twenty to thirty minutes before working out will loosen up your tissues and help with improving the range of motion but do not use heat for the first two weeks following surgery. These exercises can be done on a training (exercise) mat, on the floor, on a table or on a bed. Use what ever works the best and is most comfortable for you Knee exercises include:  Leg Lifts - While your knee is still immobilized in a splint or cast, you can do straight leg raises. Lift the leg to 60 degrees, hold for 3 sec, and slowly lower the leg. Repeat 10-20 times 2-3 times daily. Perform this exercise against resistance later as your knee gets better.  Quad and Hamstring Sets - Tighten up the muscle on the front of the thigh (Quad) and hold for 5-10 sec. Repeat this 10-20 times hourly. Hamstring sets are done by pushing the  foot backward against an object and holding for 5-10 sec. Repeat as with quad sets.   Leg Slides: Lying on your back, slowly slide your foot toward your buttocks, bending your knee up off the floor (only go as far as is comfortable). Then slowly slide your foot back down until your leg is flat on the floor again.  Angel Wings: Lying on your back spread your legs to the side as far apart as you can without causing discomfort.  A rehabilitation program following serious knee injuries can speed recovery and prevent re-injury in the future due to weakened muscles. Contact your doctor or a physical therapist for more information on knee rehabilitation.   IF YOU ARE TRANSFERRED TO A SKILLED REHAB FACILITY If the patient is transferred to a skilled rehab facility following release from the hospital, a list of the current medications will be sent to the facility for the patient to continue.  When discharged from the skilled rehab facility, please have the facility set up the patient's [place] prior to being released. Also, the skilled facility will be responsible for providing the patient with their medications at time of release from the facility to include their pain medication, the muscle relaxants, and their blood thinner medication. If the patient is still at the rehab facility at time of the two week follow up appointment, the skilled rehab facility will also need to assist the patient in arranging follow up appointment in our office and any transportation needs.  MAKE SURE YOU:  Understand these instructions.  Get help right away if you are not doing well or get worse.    Pick up stool softner and laxative for home use following surgery while on pain medications. Do not submerge incision under water. Please use good hand washing techniques while changing dressing each day. May shower starting three days after surgery. Please use a clean towel to pat the incision dry following  showers. Continue to use ice for pain and swelling after surgery. Do not use any lotions or creams on the incision until instructed by your surgeon.  Take Xarelto for two and a half more weeks, then discontinue Xarelto. Once the patient has completed the Xarelto, they may resume the Plavix 75 mg daily.    Information on my medicine - XARELTO (  Rivaroxaban)  This medication education was reviewed with me or my healthcare representative as part of my discharge preparation.  The pharmacist that spoke with me during my hospital stay was:  [facility]  Why was Xarelto prescribed for you? Xarelto was prescribed for you to reduce the risk of blood clots forming after orthopedic surgery. The medical term for these abnormal blood clots is venous thromboembolism (VTE).  What do you need to know about xarelto ? Take your Xarelto ONCE DAILY at the same time every day. You may take it either with or without food.  If you have difficulty swallowing the tablet whole, you may crush it and mix in applesauce just prior to taking your dose.  Take Xarelto exactly as prescribed by your doctor and DO NOT stop taking Xarelto without talking to the doctor who prescribed the medication.  Stopping without other VTE prevention medication to take the place of Xarelto may increase your risk of developing a clot.  After discharge, you should have regular check-up appointments with your healthcare provider that is prescribing your Xarelto.    What do you do if you miss a dose? If you miss a dose, take it as soon as you remember on the same day then continue your regularly scheduled once daily regimen the next day. Do not take two doses of Xarelto on the same day.   Important Safety Information A possible side effect of Xarelto is bleeding. You should call your healthcare provider right away if you experience any of the following: ? Bleeding from an injury or your nose that does not  stop. ? Unusual colored urine (red or dark brown) or unusual colored stools (red or black). ? Unusual bruising for unknown reasons. ? A serious fall or if you hit your head (even if there is no bleeding).  Some medicines may interact with Xarelto and might increase your risk of bleeding while on Xarelto. To help avoid this, consult your healthcare provider or pharmacist prior to using any new prescription or non-prescription medications, including herbals, vitamins, non-steroidal anti-inflammatory drugs (NSAIDs) and supplements.  This website has more information on Xarelto: [email or web address]

## 2016-07-15 NOTE — Progress Notes (Signed)
   07/15/16 1500  PT Visit Information  Last PT Received On 07/15/16  Assistance Needed +1  History of Present Illness Pt is a 65 year old male s/p L TKA  Subjective Data  Subjective Pt ambulated in hallway again this afternoon.    Precautions  Precautions Fall;Knee  Required Braces or Orthoses Knee Immobilizer - Left  Knee Immobilizer - Left Discontinue once straight leg raise with < 10 degree lag  Restrictions  Other Position/Activity Restrictions WBAT  Pain Assessment  Pain Assessment 0-10  Pain Score 3  Pain Location L knee  Pain Descriptors / Indicators Aching;Sore  Pain Intervention(s) Limited activity within patient's tolerance;Monitored during session;Repositioned  Cognition  Arousal/Alertness Awake/alert  Behavior During Therapy WFL for tasks assessed/performed  Overall Cognitive Status Within Functional Limits for tasks assessed  Bed Mobility  Overal bed mobility Needs Assistance  Bed Mobility Supine to Sit;Sit to Supine  Supine to sit Supervision  Sit to supine Supervision  General bed mobility comments pt self assisted L LE with R LE  Transfers  Overall transfer level Needs assistance  Equipment used Rolling walker (2 wheeled)  Transfers Sit to/from Stand  Sit to Stand Min guard  General transfer comment verbal cues for UE and LE positioning  Ambulation/Gait  Ambulation/Gait assistance Min guard  Ambulation Distance (Feet) 160 Feet  Assistive device Rolling walker (2 wheeled)  Gait Pattern/deviations Step-through pattern;Decreased stride length;Decreased stance time - left  General Gait Details verbal cues for step length, RW positioning, posture  PT - End of Session  Equipment Utilized During Treatment Left knee immobilizer  Activity Tolerance Patient tolerated treatment well  Patient left in bed;with call bell/phone within reach;with family/visitor present  PT - Assessment/Plan  PT Plan Current plan remains appropriate  PT Frequency (ACUTE ONLY) Min  3X/week  Follow Up Recommendations Home health PT  PT equipment None recommended by PT  PT Goal Progression  Progress towards PT goals Progressing toward goals  PT Time Calculation  PT Start Time (ACUTE ONLY) 1346  PT Stop Time (ACUTE ONLY) 1355  PT Time Calculation (min) (ACUTE ONLY) 9 min  PT General Charges  $$ ACUTE PT VISIT 1 Procedure  PT Treatments  $Gait Training 8-22 mins   Carmelia Bake, PT, DPT 07/15/2016 Pager: [phone]

## 2016-07-16 LAB — CBC
HCT: 42.9 % (ref 39.0–52.0)
Hemoglobin: 14.8 g/dL (ref 13.0–17.0)
MCH: 27 pg (ref 26.0–34.0)
MCHC: 34.5 g/dL (ref 30.0–36.0)
MCV: 78.3 fL (ref 78.0–100.0)
PLATELETS: 203 10*3/uL (ref 150–400)
RBC: 5.48 MIL/uL (ref 4.22–5.81)
RDW: 17.2 % — ABNORMAL HIGH (ref 11.5–15.5)
WBC: 14.7 10*3/uL — AB (ref 4.0–10.5)

## 2016-07-16 LAB — BASIC METABOLIC PANEL
ANION GAP: 10 (ref 5–15)
BUN: 15 mg/dL (ref 6–20)
CO2: 28 mmol/L (ref 22–32)
Calcium: 9 mg/dL (ref 8.9–10.3)
Chloride: 95 mmol/L — ABNORMAL LOW (ref 101–111)
Creatinine, Ser: 1.04 mg/dL (ref 0.61–1.24)
GLUCOSE: 168 mg/dL — AB (ref 65–99)
POTASSIUM: 4.1 mmol/L (ref 3.5–5.1)
Sodium: 133 mmol/L — ABNORMAL LOW (ref 135–145)

## 2016-07-16 LAB — GLUCOSE, CAPILLARY
GLUCOSE-CAPILLARY: 145 mg/dL — AB (ref 65–99)
Glucose-Capillary: 147 mg/dL — ABNORMAL HIGH (ref 65–99)

## 2016-07-16 MED ORDER — OXYCODONE HCL 5 MG PO TABS: 5.0000 mg | ORAL_TABLET | ORAL | 0 refills | Status: DC | PRN

## 2016-07-16 MED ORDER — OXYCODONE HCL 5 MG PO TABS
5.0000 mg | ORAL_TABLET | ORAL | Status: DC | PRN
  Administered 2016-07-16: 10 mg via ORAL
  Filled 2016-07-16: qty 2

## 2016-07-16 MED ORDER — METHOCARBAMOL 500 MG PO TABS: 500.0000 mg | ORAL_TABLET | Freq: Four times a day (QID) | ORAL | 0 refills | Status: DC | PRN

## 2016-07-16 MED ORDER — RIVAROXABAN 10 MG PO TABS: 10.0000 mg | ORAL_TABLET | Freq: Every day | ORAL | 0 refills | Status: DC

## 2016-07-16 MED ORDER — TRAMADOL HCL 50 MG PO TABS: 50.0000 mg | ORAL_TABLET | Freq: Four times a day (QID) | ORAL | 1 refills | Status: DC | PRN

## 2016-07-16 NOTE — Progress Notes (Signed)
   Subjective: 2 Days Post-Op Procedure(s) (LRB): LEFT TOTAL KNEE ARTHROPLASTY (Left) Patient reports pain as moderate.   Patient seen in rounds with Dr. Wynelle Link. Patient is well, but has had some minor complaints of pain in the knee, requiring pain medications Patient is ready to go home  Objective: Vital signs in last 24 hours: Temp:  [97.5 F (36.4 C)-98.4 F (36.9 C)] 98.4 F (36.9 C) (12/20 0604) Pulse Rate:  [62-88] 88 (12/20 0604) Resp:  [16-20] 16 (12/20 0604) BP: (142-167)/(51-74) 144/73 (12/20 0604) SpO2:  [95 %-96 %] 95 % (12/20 0604)  Intake/Output from previous day:  Intake/Output Summary (Last 24 hours) at 07/16/16 0717 Last data filed at 07/16/16 0605  Gross per 24 hour  Intake             1320 ml  Output             1905 ml  Net             -585 ml    Intake/Output this shift: No intake/output data recorded.  Labs:  Recent Labs  07/15/16 0403 07/16/16 0435  HGB 14.4 14.8    Recent Labs  07/15/16 0403 07/16/16 0435  WBC 16.3* 14.7*  RBC 5.36 5.48  HCT 43.0 42.9  PLT 239 203    Recent Labs  07/15/16 0403 07/16/16 0435  NA 136 133*  K 4.6 4.1  CL 101 95*  CO2 29 28  BUN 18 15  CREATININE 1.02 1.04  GLUCOSE 193* 168*  CALCIUM 8.6* 9.0   No results for input(s): LABPT, INR in the last 72 hours.  EXAM: General - Patient is Alert, Appropriate and Oriented Extremity - Neurovascular intact Sensation intact distally Intact pulses distally Incision - clean, dry, no drainage Motor Function - intact, moving foot and toes well on exam.   Assessment/Plan: 2 Days Post-Op Procedure(s) (LRB): LEFT TOTAL KNEE ARTHROPLASTY (Left) Procedure(s) (LRB): LEFT TOTAL KNEE ARTHROPLASTY (Left) Past Medical History:  Diagnosis Date  . ACE-inhibitor cough   . Anxiety   . Bronchitis 06/27/2016  . COPD (chronic obstructive pulmonary disease) (HCC)    cath, mild inf. hypokinesis EF 49%, 100% ROA  . Coronary artery disease 2001  . Coronary  atherosclerosis   . Diabetes mellitus type II   . Hyperlipidemia   . Hypertension   . OA (osteoarthritis)    knee OA, injected 2012 by ortho  . Pneumonia    Principal Problem:   OA (osteoarthritis) of knee  Estimated body mass index is 33.29 kg/m as calculated from the following:   Height as of this encounter: 5\' 10"  (1.778 m).   Weight as of this encounter: 105.2 kg (232 lb). Up with therapy Discharge home - straight to outpatient Diet - Cardiac diet and Diabetic diet Follow up - in 2 weeks Activity - WBAT Disposition - Home Condition Upon Discharge - Good D/C Meds - See DC Summary DVT Prophylaxis - Xarelto  Arlee Muslim, PA-C Orthopaedic Surgery 07/16/2016, 7:17 AM

## 2016-07-16 NOTE — Progress Notes (Signed)
Occupational Therapy Treatment Patient Details Name: MARINO ROGERSON MRN: [id number] DOB: [birth year] Today's Date: 07/16/2016    History of present illness Pt is a 65 year old male s/p L TKA   OT comments  All education completed this session  Follow Up Recommendations  No OT follow up;Supervision - Intermittent    Equipment Recommendations  None recommended by OT    Recommendations for Other Services      Precautions / Restrictions Precautions Precautions: Fall;Knee Precaution Comments: reviewed no pillow under knee Required Braces or Orthoses: Knee Immobilizer - Left Knee Immobilizer - Left: Discontinue once straight leg raise with < 10 degree lag Restrictions Weight Bearing Restrictions: No Other Position/Activity Restrictions: WBAT       Mobility Bed Mobility                  Transfers   Equipment used: Rolling walker (2 wheeled)   Sit to Stand: Supervision         General transfer comment: vcs to extend LLE before sitting    Balance                                   ADL                                   Tub/ Shower Transfer: Walk-in shower;Min guard;Ambulation;3 in 1     General ADL Comments: reviewed precautions. Wife present.  Practiced shower transfer.  She will assist as needed with adls.. Pt has AE, but he cannot lift leg on his own yet.  He supports it by crossing RLE under it when getting into/out of chair      Vision                     Perception     Praxis      Cognition   Behavior During Therapy: [facility] for tasks assessed/performed Overall Cognitive Status: Within Functional Limits for tasks assessed                       Extremity/Trunk Assessment               Exercises     Shoulder Instructions       General Comments      Pertinent Vitals/ Pain       Pain Score: 6  Pain Location: L knee and thigh Pain Descriptors / Indicators: Aching Pain  Intervention(s): Limited activity within patient's tolerance;Monitored during session;Premedicated before session;Repositioned;Ice applied  Home Living                                          Prior Functioning/Environment              Frequency           Progress Toward Goals  OT Goals(current goals can now be found in the care plan section)  Progress towards OT goals: Progressing toward goals (no further OT is  needed)     Plan      Co-evaluation                 End of Session CPM Left Knee CPM Left Knee: Off   Activity Tolerance  Patient limited by pain   Patient Left in chair;with call bell/phone within reach;with family/visitor present   Nurse Communication          Time: 2111-5520 OT Time Calculation (min): 10 min  Charges: OT General Charges $OT Visit: 1 Procedure OT Treatments $Self Care/Home Management : 8-22 mins  Hakiem Malizia 07/16/2016, 10:09 AM Lesle Chris, OTR/L [phone] 07/16/2016

## 2016-07-16 NOTE — Progress Notes (Signed)
Pt to d/c home. Outpatient PT scheduled and No DME needs. AVS reviewed and "My Chart" discussed with pt. Pt capable of verbalizing medications, dressing changes, signs and symptoms of infection, and follow-up appointments. Remains hemodynamically stable. No signs and symptoms of distress. Educated pt to return to ER in the case of SOB, dizziness, or chest pain.

## 2016-07-16 NOTE — Progress Notes (Signed)
Physical Therapy Treatment Patient Details Name: Joshua Roy MRN: [id number] DOB: 07-08-1951 Today's Date: 07/16/2016    History of Present Illness Pt is a 65 year old male s/p L TKA    PT Comments    Spouse educated on assisting pt with KI.  Pt and spouse educated on performing safe stair technique.  Pt ambulated in hallway and reports feeling very stiff today.  Provided and reviewed HEP handout with pt and spouse.  Pt feels ready for d/c home today.   Follow Up Recommendations  Home health PT     Equipment Recommendations  None recommended by PT    Recommendations for Other Services       Precautions / Restrictions Precautions Precautions: Fall;Knee Precaution Comments: reviewed no pillow under knee Required Braces or Orthoses: Knee Immobilizer - Left Knee Immobilizer - Left: Discontinue once straight leg raise with < 10 degree lag Restrictions Other Position/Activity Restrictions: WBAT    Mobility  Bed Mobility Overal bed mobility: Needs Assistance Bed Mobility: Supine to Sit     Supine to sit: Supervision     General bed mobility comments: pt self assisted L LE with R LE  Transfers Overall transfer level: Needs assistance Equipment used: Rolling walker (2 wheeled) Transfers: Sit to/from Stand Sit to Stand: Min guard         General transfer comment: verbal cues for UE and LE positioning  Ambulation/Gait Ambulation/Gait assistance: Min guard Ambulation Distance (Feet): 120 Feet Assistive device: Rolling walker (2 wheeled) Gait Pattern/deviations: Step-through pattern;Decreased stride length;Decreased stance time - left     General Gait Details: verbal cues for step length, RW positioning, posture   Stairs Stairs: Yes   Stair Management: Step to pattern;Backwards;With walker Number of Stairs: 3 General stair comments: verbal cues for sequence, safety, technique, spouse present and observed.  therapist also demonstrated going up  forwards holding L rail with both hands  Wheelchair Mobility    Modified Rankin (Stroke Patients Only)       Balance                                    Cognition Arousal/Alertness: Awake/alert Behavior During Therapy: WFL for tasks assessed/performed Overall Cognitive Status: Within Functional Limits for tasks assessed                      Exercises      General Comments        Pertinent Vitals/Pain Pain Assessment: 0-10 Pain Score: 5  Pain Location: L knee and thigh Pain Descriptors / Indicators: Aching;Sore Pain Intervention(s): Limited activity within patient's tolerance;Monitored during session;Repositioned    Home Living                      Prior Function            PT Goals (current goals can now be found in the care plan section) Progress towards PT goals: Progressing toward goals    Frequency    Min 3X/week      PT Plan Current plan remains appropriate    Co-evaluation             End of Session Equipment Utilized During Treatment: Left knee immobilizer Activity Tolerance: Patient tolerated treatment well Patient left: in chair;with call bell/phone within reach;with family/visitor present     Time: 4580-9983 PT Time Calculation (min) (ACUTE ONLY): 17 min  Charges:  $Gait Training: 8-22 mins                    G Codes:      Elantra Caprara,KATHrine E 31-Jul-2016, 1:22 PM Carmelia Bake, PT, DPT Jul 31, 2016 Pager: [phone]

## 2016-07-25 DIAGNOSIS — Z96652 Presence of left artificial knee joint: Secondary | ICD-10-CM | POA: Diagnosis not present

## 2016-07-25 DIAGNOSIS — M25562 Pain in left knee: Secondary | ICD-10-CM | POA: Diagnosis not present

## 2016-07-29 DIAGNOSIS — Z471 Aftercare following joint replacement surgery: Secondary | ICD-10-CM | POA: Diagnosis not present

## 2016-07-29 DIAGNOSIS — Z96652 Presence of left artificial knee joint: Secondary | ICD-10-CM | POA: Diagnosis not present

## 2016-07-30 DIAGNOSIS — Z96652 Presence of left artificial knee joint: Secondary | ICD-10-CM | POA: Diagnosis not present

## 2016-07-30 DIAGNOSIS — M25562 Pain in left knee: Secondary | ICD-10-CM | POA: Diagnosis not present

## 2016-08-01 DIAGNOSIS — Z96652 Presence of left artificial knee joint: Secondary | ICD-10-CM | POA: Diagnosis not present

## 2016-08-01 DIAGNOSIS — M25562 Pain in left knee: Secondary | ICD-10-CM | POA: Diagnosis not present

## 2016-08-04 DIAGNOSIS — M25562 Pain in left knee: Secondary | ICD-10-CM | POA: Diagnosis not present

## 2016-08-04 DIAGNOSIS — Z96652 Presence of left artificial knee joint: Secondary | ICD-10-CM | POA: Diagnosis not present

## 2016-08-05 DIAGNOSIS — E785 Hyperlipidemia, unspecified: Secondary | ICD-10-CM | POA: Diagnosis not present

## 2016-08-05 DIAGNOSIS — E119 Type 2 diabetes mellitus without complications: Secondary | ICD-10-CM | POA: Diagnosis not present

## 2016-08-05 DIAGNOSIS — I10 Essential (primary) hypertension: Secondary | ICD-10-CM | POA: Diagnosis not present

## 2016-08-05 DIAGNOSIS — F411 Generalized anxiety disorder: Secondary | ICD-10-CM | POA: Diagnosis not present

## 2016-08-07 DIAGNOSIS — Z96652 Presence of left artificial knee joint: Secondary | ICD-10-CM | POA: Diagnosis not present

## 2016-08-07 DIAGNOSIS — M25562 Pain in left knee: Secondary | ICD-10-CM | POA: Diagnosis not present

## 2016-08-11 DIAGNOSIS — Z96652 Presence of left artificial knee joint: Secondary | ICD-10-CM | POA: Diagnosis not present

## 2016-08-11 DIAGNOSIS — M25562 Pain in left knee: Secondary | ICD-10-CM | POA: Diagnosis not present

## 2016-08-18 DIAGNOSIS — M25562 Pain in left knee: Secondary | ICD-10-CM | POA: Diagnosis not present

## 2016-08-18 DIAGNOSIS — Z96652 Presence of left artificial knee joint: Secondary | ICD-10-CM | POA: Diagnosis not present

## 2016-08-19 DIAGNOSIS — Z96652 Presence of left artificial knee joint: Secondary | ICD-10-CM | POA: Diagnosis not present

## 2016-08-19 DIAGNOSIS — Z471 Aftercare following joint replacement surgery: Secondary | ICD-10-CM | POA: Diagnosis not present

## 2016-08-21 DIAGNOSIS — M25562 Pain in left knee: Secondary | ICD-10-CM | POA: Diagnosis not present

## 2016-08-21 DIAGNOSIS — Z96652 Presence of left artificial knee joint: Secondary | ICD-10-CM | POA: Diagnosis not present

## 2016-08-25 DIAGNOSIS — Z96652 Presence of left artificial knee joint: Secondary | ICD-10-CM | POA: Diagnosis not present

## 2016-08-25 DIAGNOSIS — M25562 Pain in left knee: Secondary | ICD-10-CM | POA: Diagnosis not present

## 2016-08-28 DIAGNOSIS — Z96652 Presence of left artificial knee joint: Secondary | ICD-10-CM | POA: Diagnosis not present

## 2016-08-28 DIAGNOSIS — M25562 Pain in left knee: Secondary | ICD-10-CM | POA: Diagnosis not present

## 2016-09-01 DIAGNOSIS — Z96652 Presence of left artificial knee joint: Secondary | ICD-10-CM | POA: Diagnosis not present

## 2016-09-01 DIAGNOSIS — M25562 Pain in left knee: Secondary | ICD-10-CM | POA: Diagnosis not present

## 2016-09-04 DIAGNOSIS — M25562 Pain in left knee: Secondary | ICD-10-CM | POA: Diagnosis not present

## 2016-09-04 DIAGNOSIS — Z96652 Presence of left artificial knee joint: Secondary | ICD-10-CM | POA: Diagnosis not present

## 2016-09-09 DIAGNOSIS — M25562 Pain in left knee: Secondary | ICD-10-CM | POA: Diagnosis not present

## 2016-09-09 DIAGNOSIS — Z96652 Presence of left artificial knee joint: Secondary | ICD-10-CM | POA: Diagnosis not present

## 2016-09-16 DIAGNOSIS — Z96652 Presence of left artificial knee joint: Secondary | ICD-10-CM | POA: Diagnosis not present

## 2016-09-16 DIAGNOSIS — M25562 Pain in left knee: Secondary | ICD-10-CM | POA: Diagnosis not present

## 2016-09-18 DIAGNOSIS — Z96652 Presence of left artificial knee joint: Secondary | ICD-10-CM | POA: Diagnosis not present

## 2016-09-18 DIAGNOSIS — M25562 Pain in left knee: Secondary | ICD-10-CM | POA: Diagnosis not present

## 2016-09-19 DIAGNOSIS — E119 Type 2 diabetes mellitus without complications: Secondary | ICD-10-CM | POA: Diagnosis not present

## 2016-09-22 DIAGNOSIS — Z0000 Encounter for general adult medical examination without abnormal findings: Secondary | ICD-10-CM | POA: Diagnosis not present

## 2016-09-23 DIAGNOSIS — M25562 Pain in left knee: Secondary | ICD-10-CM | POA: Diagnosis not present

## 2016-09-23 DIAGNOSIS — Z96652 Presence of left artificial knee joint: Secondary | ICD-10-CM | POA: Diagnosis not present

## 2016-09-25 DIAGNOSIS — Z96652 Presence of left artificial knee joint: Secondary | ICD-10-CM | POA: Diagnosis not present

## 2016-09-25 DIAGNOSIS — Z471 Aftercare following joint replacement surgery: Secondary | ICD-10-CM | POA: Diagnosis not present

## 2016-09-29 ENCOUNTER — Ambulatory Visit: Payer: Medicare Other | Admitting: Cardiovascular Disease

## 2016-09-30 DIAGNOSIS — Z96652 Presence of left artificial knee joint: Secondary | ICD-10-CM | POA: Diagnosis not present

## 2016-09-30 DIAGNOSIS — M25562 Pain in left knee: Secondary | ICD-10-CM | POA: Diagnosis not present

## 2016-10-02 DIAGNOSIS — Z96652 Presence of left artificial knee joint: Secondary | ICD-10-CM | POA: Diagnosis not present

## 2016-10-02 DIAGNOSIS — M25562 Pain in left knee: Secondary | ICD-10-CM | POA: Diagnosis not present

## 2016-10-14 DIAGNOSIS — Z96652 Presence of left artificial knee joint: Secondary | ICD-10-CM | POA: Diagnosis not present

## 2016-10-14 DIAGNOSIS — M25562 Pain in left knee: Secondary | ICD-10-CM | POA: Diagnosis not present

## 2016-10-21 DIAGNOSIS — Z96652 Presence of left artificial knee joint: Secondary | ICD-10-CM | POA: Diagnosis not present

## 2016-10-21 DIAGNOSIS — M25562 Pain in left knee: Secondary | ICD-10-CM | POA: Diagnosis not present

## 2016-10-30 DIAGNOSIS — M7732 Calcaneal spur, left foot: Secondary | ICD-10-CM | POA: Diagnosis not present

## 2016-10-30 DIAGNOSIS — M722 Plantar fascial fibromatosis: Secondary | ICD-10-CM | POA: Diagnosis not present

## 2016-10-30 DIAGNOSIS — M2042 Other hammer toe(s) (acquired), left foot: Secondary | ICD-10-CM | POA: Diagnosis not present

## 2016-10-30 DIAGNOSIS — M79672 Pain in left foot: Secondary | ICD-10-CM | POA: Diagnosis not present

## 2016-11-14 ENCOUNTER — Ambulatory Visit: Payer: Medicare Other | Admitting: Podiatry

## 2016-11-20 ENCOUNTER — Ambulatory Visit: Payer: Medicare Other | Admitting: Cardiovascular Disease

## 2016-11-20 DIAGNOSIS — Z471 Aftercare following joint replacement surgery: Secondary | ICD-10-CM | POA: Diagnosis not present

## 2016-11-20 DIAGNOSIS — Z96652 Presence of left artificial knee joint: Secondary | ICD-10-CM | POA: Diagnosis not present

## 2016-11-26 NOTE — Progress Notes (Signed)
Cardiology Office Note  Date:  11/27/2016   ID:  Joshua Roy, DOB 01/09/1951, MRN [id number]  PCP:  [name], MD   Chief Complaint  Patient presents with  . other    12 month follow up. Meds reviewed by the pt. verbally. "doing well."     HPI:  Joshua Roy is 67 year old gentleman with a history of  coronary artery disease,  occluded mid to distal RCA  by cardiac catheterization in 2001,  residual 60% proximal to mid LAD disease at that time, 30% diagonal disease, 10% left main disease, 20% proximal left circumflex disease.  smoking and continues to smoke at least 1 1/2 pack per day hyperlipidemia,  obesity and diabetes  He presents today for follow-up of his coronary artery disease  Sugars much better HBA1C better Changed his diet Eating less carbohydrate  Still smoking 1.5 packs per day   diabetes hemoglobin A1c down to 6.4.  total cholesterol 155, LDL 109 We do not have his most recent numbers from primary care  He denies any chest pain concerning for angina. Planning another trip in his RV in September 2018  EKG personally reviewed by myself on todays visit Shows normal sinus rhythm with rate 65 bpm  T-wave abnormality anterolateral leads as well as 1, aVL. No significant change  Other past medical history His last stress test was 2009 when he had a small fixed inferior perfusion defect, ejection fraction 51%  PMH:   has a past medical history of ACE-inhibitor cough; Anxiety; Bronchitis (06/27/2016); COPD (chronic obstructive pulmonary disease) ([place]); Coronary artery disease (2001); Coronary atherosclerosis; Diabetes mellitus type II; Hyperlipidemia; Hypertension; OA (osteoarthritis); and Pneumonia.  PSH:    Past Surgical History:  Procedure Laterality Date  . CARDIAC CATHETERIZATION  05/06/2000   @ [place]  . ESOPHAGOGASTRODUODENOSCOPY ENDOSCOPY    . KNEE ARTHROSCOPY  07/25/04   left  . [place]   right, open surgery- repair  . TOTAL  KNEE ARTHROPLASTY Left 07/14/2016   Procedure: LEFT TOTAL KNEE ARTHROPLASTY;  Surgeon: Gaynelle Arabian, MD;  Location: [facility];  Service: Orthopedics;  Laterality: Left;    Current Outpatient Prescriptions  Medication Sig Dispense Refill  . atorvastatin (LIPITOR) 80 MG tablet Take 1 tablet (80 mg total) by mouth daily. 90 tablet 3  . BREO ELLIPTA 100-25 MCG/INH AEPB Inhale 1 puff into the lungs daily.  0  . ezetimibe (ZETIA) 10 MG tablet Take 1 tablet (10 mg total) by mouth daily. 90 tablet 3  . fenofibrate 160 MG tablet Take 1 tablet (160 mg total) by mouth daily. 90 tablet 1  . glimepiride (AMARYL) 2 MG tablet Take one half tablet (1 mg) by mouth each morning 45 tablet 1  . glipiZIDE (GLUCOTROL XL) 10 MG 24 hr tablet Take 10 mg by mouth daily with breakfast.    . glucose blood (ACCU-CHEK AVIVA PLUS) test strip Use to check blood sugar once daily or as needed.  Diagnosis:  E11.9   Non insulin-dependent. 100 each 3  . Lancets (ACCU-CHEK MULTICLIX) lancets Use as instructed to check blood sugar once daily or as needed.  Diagnosis:  E11.9   Non insulin-dependent. 102 each 3  . losartan (COZAAR) 25 MG tablet Take 1 tablet (25 mg total) by mouth daily. 90 tablet 1  . metFORMIN (GLUCOPHAGE) 1000 MG tablet Take 1 tablet (1,000 mg total) by mouth 2 (two) times daily. 180 tablet 1  . metoprolol tartrate (LOPRESSOR) 25 MG tablet Take 1 tablet (25 mg total)  by mouth 2 (two) times daily. 180 tablet 1  . pantoprazole (PROTONIX) 40 MG tablet Take 1 tablet (40 mg total) by mouth daily. 90 tablet 1  . rivaroxaban (XARELTO) 10 MG TABS tablet Take 1 tablet (10 mg total) by mouth daily with breakfast. Take Xarelto for two and a half more weeks, then discontinue Xarelto. Once the patient has completed the Xarelto, they may resume the Plavix 75 mg daily. 19 tablet 0  . sitaGLIPtin (JANUVIA) 100 MG tablet Take 1 tablet (100 mg total) by mouth daily. 90 tablet 3  . traMADol (ULTRAM) 50 MG tablet Take 1-2 tablets (50-100  mg total) by mouth every 6 (six) hours as needed (mild pain). 80 tablet 1   No current facility-administered medications for this visit.      Allergies:   Mucinex d [pseudoephedrine-guaifenesin er] and Ramipril   Social History:  The patient  reports that he has been smoking.  He has a 20.00 pack-year smoking history. He has never used smokeless tobacco. He reports that he drinks alcohol. He reports that he does not use drugs.   Family History:   family history includes Alcohol abuse in his father; Cancer in his brother; Colon cancer in his brother; Diabetes in his father and sister; Heart disease in his father and sister; Hypertension in his father.    Review of Systems: Review of Systems  Constitutional: Negative.   Respiratory: Negative.   Cardiovascular: Negative.   Gastrointestinal: Negative.   Musculoskeletal: Negative.   Neurological: Negative.   Psychiatric/Behavioral: Negative.   All other systems reviewed and are negative.    PHYSICAL EXAM: VS:  BP 130/72 (BP Location: Left Arm, Patient Position: Sitting, Cuff Size: Normal)   Pulse 69   Ht 5\' 10"  (1.778 m)   Wt 231 lb 12 oz (105.1 kg)   BMI 33.25 kg/m  , BMI Body mass index is 33.25 kg/m. GEN: Well nourished, well developed, in no acute distress  HEENT: normal  Neck: no JVD, carotid bruits, or masses Cardiac: RRR; no murmurs, rubs, or gallops,no edema  Respiratory:  mildly decreased breath sounds throughout , normal work of breathing GI: soft, nontender, nondistended, + BS MS: no deformity or atrophy  Skin: warm and dry, no rash Neuro:  Strength and sensation are intact Psych: euthymic mood, full affect    Recent Labs: 07/08/2016: ALT 21 07/16/2016: BUN 15; Creatinine, Ser 1.04; Hemoglobin 14.8; Platelets 203; Potassium 4.1; Sodium 133    Lipid Panel Lab Results  Component Value Date   CHOL 155 11/15/2014   HDL 35.00 (L) 11/15/2014   LDLCALC 93 09/26/2013   TRIG 204.0 (H) 11/15/2014      Wt  Readings from Last 3 Encounters:  11/27/16 231 lb 12 oz (105.1 kg)  07/14/16 232 lb (105.2 kg)  07/08/16 232 lb (105.2 kg)       ASSESSMENT AND PLAN:  Coronary artery disease of native artery of native heart with stable angina pectoris ([place]) Stressed importance of smoking cessation Stressed importance of continued weight loss, aggressive diabetes control  Uncontrolled type 2 diabetes mellitus with circulatory disorder causing erectile dysfunction ([place]) He has been working with primary care, dramatic improvement in his hemoglobin A1c  Mixed hyperlipidemia Encouraged him to continue his current medications Goal LDL less than 70  Essential hypertension Blood pressure is well controlled on today's visit. No changes made to the medications.  DISORDER, TOBACCO USE We have encouraged him to continue to work on weaning his cigarettes and smoking cessation. He  will continue to work on this and does not want any assistance with chantix.   Centrilobular emphysema (HCC) Stable, no recent exacerbation of his COPD  SOB (shortness of breath)  Morbid obesity ([place]) We have encouraged continued exercise, careful diet management in an effort to lose weight.   Total encounter time more than 25 minutes  Greater than 50% was spent in counseling and coordination of care with the patient   Disposition:   F/U  6 months  No orders of the defined types were placed in this encounter.    Signed, Esmond Plants, M.D., Ph.D. 11/27/2016  [facility] [place], [place] [phone]

## 2016-11-27 ENCOUNTER — Ambulatory Visit (INDEPENDENT_AMBULATORY_CARE_PROVIDER_SITE_OTHER): Payer: Medicare Other | Admitting: Cardiovascular Disease

## 2016-11-27 ENCOUNTER — Encounter: Payer: Self-pay | Admitting: Cardiovascular Disease

## 2016-11-27 VITALS — BP 130/72 | HR 69 | Ht 70.0 in | Wt 231.8 lb

## 2016-11-27 DIAGNOSIS — R0602 Shortness of breath: Secondary | ICD-10-CM

## 2016-11-27 DIAGNOSIS — E1165 Type 2 diabetes mellitus with hyperglycemia: Secondary | ICD-10-CM | POA: Diagnosis not present

## 2016-11-27 DIAGNOSIS — N521 Erectile dysfunction due to diseases classified elsewhere: Secondary | ICD-10-CM | POA: Diagnosis not present

## 2016-11-27 DIAGNOSIS — E1159 Type 2 diabetes mellitus with other circulatory complications: Secondary | ICD-10-CM

## 2016-11-27 DIAGNOSIS — E782 Mixed hyperlipidemia: Secondary | ICD-10-CM | POA: Diagnosis not present

## 2016-11-27 DIAGNOSIS — I10 Essential (primary) hypertension: Secondary | ICD-10-CM | POA: Diagnosis not present

## 2016-11-27 DIAGNOSIS — I25118 Atherosclerotic heart disease of native coronary artery with other forms of angina pectoris: Secondary | ICD-10-CM | POA: Diagnosis not present

## 2016-11-27 DIAGNOSIS — I209 Angina pectoris, unspecified: Secondary | ICD-10-CM | POA: Diagnosis not present

## 2016-11-27 DIAGNOSIS — J432 Centrilobular emphysema: Secondary | ICD-10-CM | POA: Diagnosis not present

## 2016-11-27 DIAGNOSIS — F17200 Nicotine dependence, unspecified, uncomplicated: Secondary | ICD-10-CM

## 2016-11-27 DIAGNOSIS — IMO0002 Reserved for concepts with insufficient information to code with codable children: Secondary | ICD-10-CM

## 2016-11-27 NOTE — Patient Instructions (Addendum)

## 2016-12-11 DIAGNOSIS — I2510 Atherosclerotic heart disease of native coronary artery without angina pectoris: Secondary | ICD-10-CM | POA: Diagnosis not present

## 2016-12-11 DIAGNOSIS — E1142 Type 2 diabetes mellitus with diabetic polyneuropathy: Secondary | ICD-10-CM | POA: Diagnosis not present

## 2016-12-11 DIAGNOSIS — I10 Essential (primary) hypertension: Secondary | ICD-10-CM | POA: Diagnosis not present

## 2016-12-11 DIAGNOSIS — M5440 Lumbago with sciatica, unspecified side: Secondary | ICD-10-CM | POA: Diagnosis not present

## 2016-12-12 DIAGNOSIS — M4686 Other specified inflammatory spondylopathies, lumbar region: Secondary | ICD-10-CM | POA: Diagnosis not present

## 2017-02-03 ENCOUNTER — Other Ambulatory Visit: Payer: Self-pay | Admitting: Family Medicine

## 2017-02-16 DIAGNOSIS — M79672 Pain in left foot: Secondary | ICD-10-CM | POA: Diagnosis not present

## 2017-02-16 DIAGNOSIS — M722 Plantar fascial fibromatosis: Secondary | ICD-10-CM | POA: Diagnosis not present

## 2017-03-17 DIAGNOSIS — R5381 Other malaise: Secondary | ICD-10-CM | POA: Diagnosis not present

## 2017-03-17 DIAGNOSIS — E119 Type 2 diabetes mellitus without complications: Secondary | ICD-10-CM | POA: Diagnosis not present

## 2017-03-17 DIAGNOSIS — C61 Malignant neoplasm of prostate: Secondary | ICD-10-CM | POA: Diagnosis not present

## 2017-03-17 DIAGNOSIS — I10 Essential (primary) hypertension: Secondary | ICD-10-CM | POA: Diagnosis not present

## 2017-03-17 DIAGNOSIS — E784 Other hyperlipidemia: Secondary | ICD-10-CM | POA: Diagnosis not present

## 2017-03-19 DIAGNOSIS — E119 Type 2 diabetes mellitus without complications: Secondary | ICD-10-CM | POA: Diagnosis not present

## 2017-03-19 DIAGNOSIS — I2510 Atherosclerotic heart disease of native coronary artery without angina pectoris: Secondary | ICD-10-CM | POA: Diagnosis not present

## 2017-03-19 DIAGNOSIS — E1142 Type 2 diabetes mellitus with diabetic polyneuropathy: Secondary | ICD-10-CM | POA: Diagnosis not present

## 2017-03-19 DIAGNOSIS — I10 Essential (primary) hypertension: Secondary | ICD-10-CM | POA: Diagnosis not present

## 2017-03-25 DIAGNOSIS — L821 Other seborrheic keratosis: Secondary | ICD-10-CM | POA: Diagnosis not present

## 2017-03-25 DIAGNOSIS — D225 Melanocytic nevi of trunk: Secondary | ICD-10-CM | POA: Diagnosis not present

## 2017-03-25 DIAGNOSIS — D2272 Melanocytic nevi of left lower limb, including hip: Secondary | ICD-10-CM | POA: Diagnosis not present

## 2017-03-25 DIAGNOSIS — D2261 Melanocytic nevi of right upper limb, including shoulder: Secondary | ICD-10-CM | POA: Diagnosis not present

## 2017-04-22 DIAGNOSIS — M722 Plantar fascial fibromatosis: Secondary | ICD-10-CM | POA: Diagnosis not present

## 2017-04-22 DIAGNOSIS — M79672 Pain in left foot: Secondary | ICD-10-CM | POA: Diagnosis not present

## 2017-05-19 ENCOUNTER — Telehealth: Payer: Self-pay | Admitting: Cardiovascular Disease

## 2017-05-19 DIAGNOSIS — Z01810 Encounter for preprocedural cardiovascular examination: Secondary | ICD-10-CM

## 2017-05-19 NOTE — Telephone Encounter (Signed)
Spoke with patient and he denies any new symptoms from his last visit. He denies any shortness of breath and denies chest pain. Let him know that I would make Dr. Rockey Situ aware for his review for clearance. He verbalized understanding with no further questions at this time.

## 2017-05-19 NOTE — Telephone Encounter (Signed)
Received request for cardiac clearance for patients scheduled Left TKA-Medial & Lateral w/wo patella resurfacing to be done 07/13/17 with Dr. Wynelle Link. Please route clearance to Attention Fabio Asa [phone]

## 2017-05-20 NOTE — Telephone Encounter (Signed)
He needs lexiscan myoview to clear his heart One vessel was blocked, RCA, in 2001 LAD had 60% He smoked 1.5 ppd since then

## 2017-05-21 NOTE — Telephone Encounter (Signed)
Left voicemail message for patient to call back.

## 2017-05-22 ENCOUNTER — Encounter: Payer: Self-pay | Admitting: *Deleted

## 2017-05-22 NOTE — Telephone Encounter (Signed)
Spoke with patient and he wants to move forward with scheduling stress test. Scheduled stress test for Wednesday Novembert 7th at 07:30 AM with arrival time of 07:15AM to register. Reviewed all instructions for procedure and will mail letter to patient with those instructions as well. He verbalized understanding and had no further questions at this time.

## 2017-06-01 ENCOUNTER — Telehealth: Payer: Self-pay | Admitting: Cardiovascular Disease

## 2017-06-01 NOTE — Telephone Encounter (Signed)
Spoke with patient and reviewed stress test and instructions. He verbalized understanding and confirmed date and time as well. He verbalized understanding of our conversation and had no further questions at this time.

## 2017-06-01 NOTE — Telephone Encounter (Signed)
Pt has some questions regarding his stress test. Please call.

## 2017-06-03 ENCOUNTER — Encounter
Admission: RE | Admit: 2017-06-03 | Discharge: 2017-06-03 | Disposition: A | Discharge status: Home / Self Care | Payer: Medicare Other | Source: Ambulatory Visit | Attending: Cardiovascular Disease | Admitting: Cardiovascular Disease

## 2017-06-03 DIAGNOSIS — Z01810 Encounter for preprocedural cardiovascular examination: Secondary | ICD-10-CM | POA: Insufficient documentation

## 2017-06-03 LAB — NM MYOCAR MULTI W/SPECT W/WALL MOTION / EF
CHL CUP NUCLEAR SRS: 5
CHL CUP NUCLEAR SSS: 8
CHL CUP STRESS STAGE 1 GRADE: 0 %
CHL CUP STRESS STAGE 1 HR: 80 {beats}/min
CHL CUP STRESS STAGE 1 SPEED: 0 mph
CHL CUP STRESS STAGE 2 HR: 80 {beats}/min
CHL CUP STRESS STAGE 4 DBP: 94 mmHg
CHL CUP STRESS STAGE 4 SPEED: 2.5 mph
CHL CUP STRESS STAGE 5 GRADE: 0 %
CHL CUP STRESS STAGE 5 HR: 120 {beats}/min
CHL CUP STRESS STAGE 5 SPEED: 0 mph
CHL CUP STRESS STAGE 6 DBP: 81 mmHg
CHL CUP STRESS STAGE 6 GRADE: 0 %
CSEPPBP: 200 mmHg
CSEPPMHR: 89 %
Estimated workload: 7 METS
Exercise duration (min): 5 min
Exercise duration (sec): 46 s
LV dias vol: 124 mL (ref 62–150)
LVSYSVOL: 66 mL
MPHR: 153 {beats}/min
Peak HR: 137 {beats}/min
Percent HR: 90 %
Rest HR: 68 {beats}/min
SDS: 4
Stage 2 Grade: 0 %
Stage 2 Speed: 0 mph
Stage 3 DBP: 94 mmHg
Stage 3 Grade: 10 %
Stage 3 HR: 115 {beats}/min
Stage 3 SBP: 198 mmHg
Stage 3 Speed: 1.7 mph
Stage 4 Grade: 12 %
Stage 4 HR: 137 {beats}/min
Stage 4 SBP: 200 mmHg
Stage 6 HR: 86 {beats}/min
Stage 6 SBP: 160 mmHg
Stage 6 Speed: 0 mph
TID: 0.86

## 2017-06-03 MED ORDER — TECHNETIUM TC 99M TETROFOSMIN IV KIT
26.7600 | PACK | Freq: Once | INTRAVENOUS | Status: AC | PRN
  Administered 2017-06-03: 26.76 via INTRAVENOUS

## 2017-06-03 MED ORDER — TECHNETIUM TC 99M TETROFOSMIN IV KIT
9.1700 | PACK | Freq: Once | INTRAVENOUS | Status: AC | PRN
  Administered 2017-06-03: 9.17 via INTRAVENOUS

## 2017-06-26 ENCOUNTER — Ambulatory Visit: Payer: Self-pay | Admitting: Orthopedic Surgery

## 2017-06-26 NOTE — H&P (View-Only) (Signed)
Joshua Roy DOB: 21-May-1951 Married / Language: English / Race: White Male Date of Admission:  07/13/2017 CC:  Right knee pain History of Present Illness The patient is a 66 year old male who comes in for a preoperative History and Physical. The patient is scheduled for a right total knee arthroplasty to be performed by Dr. Dione Plover. Aluisio, MD at [facility] on 07-13-2017. The patient is a 66 year old male who presented for follow up of their knees. The patient is being followed for their right knee pain and osteoarthritis. Symptoms reported include: pain, pain with weightbearing, difficulty ambulating, difficulty arising from a kneeling position and difficulty arising from chair. The patient feels that they are doing poorly and report their pain level to be moderate to severe. Current treatment includes: glucosamine. The following medication has been used for pain control: Oxycodone. He is self employed and says it is getting harder for him to do the activities that he desires. The knee is hurting at all times. It is limiting what he can and cannot do. He states that the pain is starting to occur at night now also. He does not get any swelling. The knee does not give out on him. Has not locked on him. He is not having any associated hip pain. He is not having lower extremity weakness or paresthesia with it. AP and lateral of the knees show that he has bone on bone arthritis medial and patellofemoral both knees. The left knee has since been replaced but the right knee continues to be problematic. At this point, the most predictable means of improving pain and function is total knee arthroplasty. He is at a stage now where the knee has essentially taken over his life. Long term the only anything is going to make him better would be a total knee arthroplasty. He would like to proceed with surgery. They have been treated conservatively in the past for the above stated problem and  despite conservative measures, they continue to have progressive pain and severe functional limitations and dysfunction. They have failed non-operative management including home exercise, medications, and injections. It is felt that they would benefit from undergoing total joint replacement. Risks and benefits of the procedure have been discussed with the patient and they elect to proceed with surgery. There are no active contraindications to surgery such as ongoing infection or rapidly progressive neurological disease.   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  Family History Diabetes Mellitus  First Degree Relatives. father and sister Heart Disease  father Heart disease in male family member before age 70  Hypertension  mother and father Rheumatoid Arthritis  sister and brother  Social History Previously in rehab  no Tobacco use  Current every day smoker. 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  Medication History Atorvastatin  Calcium (20MG  Tablet, Oral) Active. MetFORMIN HCl (1000MG  Tablet, Oral) Active. Glimepiride (1MG  Tablet, .5 pill daily Oral) Active. Metoprolol Tartrate (25MG  Tablet, Oral) Active. Clopidogrel Bisulfate (75MG  Tablet, Oral) Active. Losartan Potassium (25MG  Tablet, Oral) Active. Pantoprazole Sodium (40MG  Tablet DR, Oral) Active. Fenofibrate (160MG  Tablet, Oral) Active. Fish Oil Active.  Past Surgical History Vasectomy  Arthroscopy of Knee  left Total Knee Replacement - Left  Date: 06/2016.   Review of Systems General Not Present- Chills, Fatigue, Fever, Memory Loss, Night Sweats, Weight Gain and Weight Loss. Skin Not Present- Eczema, Hives, Itching, Lesions and Rash. HEENT Not Present- Dentures, Double Vision, Headache, Hearing Loss, Tinnitus and Visual Loss. Respiratory Present- Cough (History of smoking) and Shortness of breath with exertion. Not Present- Allergies, Chronic Cough, Coughing up blood and Shortness of breath at rest. Cardiovascular Not Present- Chest Pain, Difficulty Breathing Lying Down, Murmur, Palpitations, Racing/skipping heartbeats and Swelling. Gastrointestinal Not Present- Abdominal Pain, Bloody Stool, Constipation, Diarrhea, Difficulty Swallowing, Heartburn, Jaundice, Loss of appetitie, Nausea and Vomiting. Male Genitourinary Present- Urinating at Night and Weak urinary stream. Not Present- Blood in Urine, Discharge, Flank Pain, Incontinence, Painful Urination, Urgency, Urinary frequency and Urinary Retention. Musculoskeletal Present- Joint Pain. Not Present- Back Pain, Joint Swelling, Morning Stiffness, Muscle Pain, Muscle Weakness and Spasms. Neurological Not Present- Blackout spells, Difficulty with balance, Dizziness, Paralysis, Tremor and Weakness. Psychiatric Not Present- Insomnia.  Vitals  Weight: 235 lb Height: 70in Weight was reported by patient. Height was reported by patient. Body Surface Area: 2.24 m Body Mass Index: 33.72 kg/m   Pulse: 78 (Regular)  BP: 138/78 (Sitting, Right Arm, Standard)   Physical Exam General Mental Status -Alert, cooperative and good historian. General Appearance-pleasant, Not in acute distress. Orientation-Oriented X3. Build & Nutrition-Well nourished and Well developed.  Head and Neck Head-normocephalic, atraumatic . Neck Global Assessment - supple, no bruit auscultated on the right, no bruit auscultated on the left.  Eye Pupil - Bilateral-Regular and Round. Motion - Bilateral-EOMI.  Chest and Lung Exam Auscultation Breath sounds - clear at anterior chest wall and clear at posterior chest wall. Adventitious sounds - Inspiratory wheeze - Right Lower Lobe (Posterior), Right Upper Lobe (Posterior), Left Lower Lobe (Posterior) and Left Upper Lobe (Posterior). Note: end-inspiratory wheeze.  Cardiovascular Auscultation Rhythm - Regular rate and rhythm. Heart Sounds - S1 WNL and S2 WNL. Murmurs & Other Heart Sounds - Auscultation of the heart reveals - No Murmurs.  Abdomen Palpation/Percussion Tenderness - Abdomen is non-tender to palpation. Rigidity (guarding) - Abdomen is soft. Auscultation Auscultation of the abdomen reveals - Bowel sounds normal.  Male Genitourinary Note: Not done, not pertinent to present illness   Musculoskeletal Note: Left knee looks excellent. There is no swelling in the knee. He ranges 7 to 120. There is no instability.  His right knee shows no effusion. Range is about 5 to 120. Marked crepitus on range of motion. Tender medial greater than lateral, no instability. There is no deformity on the right.  RADIOGRAPHS AP and lateral of the knees show that he has bone on bone arthritis medial and patellofemoral both knees, worse on the left than the right. ( The left knee has since been replaced).   Assessment & Plan Primary osteoarthritis of right knee (M17.11)  Note:Surgical Plans: Right Total Knee Replacement  Disposition:  Home, Straight to outpatient - [place]  PCP: Dr. [name] Cards: Dr. Rockey Situ  Topical TXA - History of MI  Anesthesia Issues: NONE  Patient was instructed on what medications to stop prior to surgery.  Signed electronically by Joelene Millin, III PA-C

## 2017-06-26 NOTE — H&P (Signed)
Joshua Roy DOB: [birth year] Married / Language: English / Race: White Male Date of Admission:  07/13/2017 CC:  Right knee pain History of Present Illness The patient is a 66 year old male who comes in for a preoperative History and Physical. The patient is scheduled for a right total knee arthroplasty to be performed by Dr. Dione Plover. Aluisio, MD at [facility] on 07-13-2017. The patient is a 66 year old male who presented for follow up of their knees. The patient is being followed for their right knee pain and osteoarthritis. Symptoms reported include: pain, pain with weightbearing, difficulty ambulating, difficulty arising from a kneeling position and difficulty arising from chair. The patient feels that they are doing poorly and report their pain level to be moderate to severe. Current treatment includes: glucosamine. The following medication has been used for pain control: Oxycodone. He is self employed and says it is getting harder for him to do the activities that he desires. The knee is hurting at all times. It is limiting what he can and cannot do. He states that the pain is starting to occur at night now also. He does not get any swelling. The knee does not give out on him. Has not locked on him. He is not having any associated hip pain. He is not having lower extremity weakness or paresthesia with it. AP and lateral of the knees show that he has bone on bone arthritis medial and patellofemoral both knees. The left knee has since been replaced but the right knee continues to be problematic. At this point, the most predictable means of improving pain and function is total knee arthroplasty. He is at a stage now where the knee has essentially taken over his life. Long term the only anything is going to make him better would be a total knee arthroplasty. He would like to proceed with surgery. They have been treated conservatively in the past for the above stated problem and  despite conservative measures, they continue to have progressive pain and severe functional limitations and dysfunction. They have failed non-operative management including home exercise, medications, and injections. It is felt that they would benefit from undergoing total joint replacement. Risks and benefits of the procedure have been discussed with the patient and they elect to proceed with surgery. There are no active contraindications to surgery such as ongoing infection or rapidly progressive neurological disease.   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  Family History Diabetes Mellitus  First Degree Relatives. father and sister Heart Disease  father Heart disease in male family member before age 17  Hypertension  mother and father Rheumatoid Arthritis  sister and brother  Social History Previously in rehab  no Tobacco use  Current every day smoker. current every day smoker; smoke(d) 1 pack(s) per day Tobacco / smoke exposure  no Most recent primary occupation  Curator Alcohol use  current drinker; drinks hard liquor; only occasionally per week Pain Contract  no Number of flights of stairs before winded  2-3 Marital status  married Living situation  live with spouse Illicit drug use  no  Exercise  Exercises weekly; does other Drug/Alcohol Rehab (Currently)  no Current work status  working full time [place] With Family, Home, Straight to Outpatient Therapy.  Medication History Atorvastatin  Calcium (20MG  Tablet, Oral) Active. MetFORMIN HCl (1000MG  Tablet, Oral) Active. Glimepiride (1MG  Tablet, .5 pill daily Oral) Active. Metoprolol Tartrate (25MG  Tablet, Oral) Active. Clopidogrel Bisulfate (75MG  Tablet, Oral) Active. Losartan Potassium (25MG  Tablet, Oral) Active. Pantoprazole Sodium (40MG  Tablet DR, Oral) Active. Fenofibrate (160MG  Tablet, Oral) Active. Fish Oil Active.  Past Surgical History Vasectomy  Arthroscopy of Knee  left Total Knee Replacement - Left  Date: 06/2016.   Review of Systems General Not Present- Chills, Fatigue, Fever, Memory Loss, Night Sweats, Weight Gain and Weight Loss. Skin Not Present- Eczema, Hives, Itching, Lesions and Rash. HEENT Not Present- Dentures, Double Vision, Headache, Hearing Loss, Tinnitus and Visual Loss. Respiratory Present- Cough (History of smoking) and Shortness of breath with exertion. Not Present- Allergies, Chronic Cough, Coughing up blood and Shortness of breath at rest. Cardiovascular Not Present- Chest Pain, Difficulty Breathing Lying Down, Murmur, Palpitations, Racing/skipping heartbeats and Swelling. Gastrointestinal Not Present- Abdominal Pain, Bloody Stool, Constipation, Diarrhea, Difficulty Swallowing, Heartburn, Jaundice, Loss of appetitie, Nausea and Vomiting. Male Genitourinary Present- Urinating at Night and Weak urinary stream. Not Present- Blood in Urine, Discharge, Flank Pain, Incontinence, Painful Urination, Urgency, Urinary frequency and Urinary Retention. Musculoskeletal Present- Joint Pain. Not Present- Back Pain, Joint Swelling, Morning Stiffness, Muscle Pain, Muscle Weakness and Spasms. Neurological Not Present- Blackout spells, Difficulty with balance, Dizziness, Paralysis, Tremor and Weakness. Psychiatric Not Present- Insomnia.  Vitals  Weight: 235 lb Height: 70in Weight was reported by patient. Height was reported by patient. Body Surface Area: 2.24 m Body Mass Index: 33.72 kg/m   Pulse: 78 (Regular)  BP: 138/78 (Sitting, Right Arm, Standard)   Physical Exam General Mental Status -Alert, cooperative and good historian. General Appearance-pleasant, Not in acute distress. Orientation-Oriented X3. Build & Nutrition-Well nourished and Well developed.  Head and Neck Head-normocephalic, atraumatic . Neck Global Assessment - supple, no bruit auscultated on the right, no bruit auscultated on the left.  Eye Pupil - Bilateral-Regular and Round. Motion - Bilateral-EOMI.  Chest and Lung Exam Auscultation Breath sounds - clear at anterior chest wall and clear at posterior chest wall. Adventitious sounds - Inspiratory wheeze - Right Lower Lobe (Posterior), Right Upper Lobe (Posterior), Left Lower Lobe (Posterior) and Left Upper Lobe (Posterior). Note: end-inspiratory wheeze.  Cardiovascular Auscultation Rhythm - Regular rate and rhythm. Heart Sounds - S1 WNL and S2 WNL. Murmurs & Other Heart Sounds - Auscultation of the heart reveals - No Murmurs.  Abdomen Palpation/Percussion Tenderness - Abdomen is non-tender to palpation. Rigidity (guarding) - Abdomen is soft. Auscultation Auscultation of the abdomen reveals - Bowel sounds normal.  Male Genitourinary Note: Not done, not pertinent to present illness   Musculoskeletal Note: Left knee looks excellent. There is no swelling in the knee. He ranges 7 to 120. There is no instability.  His right knee shows no effusion. Range is about 5 to 120. Marked crepitus on range of motion. Tender medial greater than lateral, no instability. There is no deformity on the right.  RADIOGRAPHS AP and lateral of the knees show that he has bone on bone arthritis medial and patellofemoral both knees, worse on the left than the right. ( The left knee has since been replaced).   Assessment & Plan Primary osteoarthritis of right knee (M17.11)  Note:Surgical Plans: Right Total Knee Replacement  Disposition:  Home, Straight to outpatient - [place]  PCP: Dr. [name] Cards: Dr. Rockey Situ  Topical TXA - History of MI  Anesthesia Issues: NONE  Patient was instructed on what medications to stop prior to surgery.  Signed electronically by Joelene Millin, III PA-C

## 2017-07-03 DIAGNOSIS — I10 Essential (primary) hypertension: Secondary | ICD-10-CM | POA: Diagnosis not present

## 2017-07-03 DIAGNOSIS — E119 Type 2 diabetes mellitus without complications: Secondary | ICD-10-CM | POA: Diagnosis not present

## 2017-07-03 DIAGNOSIS — H6690 Otitis media, unspecified, unspecified ear: Secondary | ICD-10-CM | POA: Diagnosis not present

## 2017-07-03 DIAGNOSIS — I519 Heart disease, unspecified: Secondary | ICD-10-CM | POA: Diagnosis not present

## 2017-07-07 DIAGNOSIS — I2510 Atherosclerotic heart disease of native coronary artery without angina pectoris: Secondary | ICD-10-CM | POA: Diagnosis not present

## 2017-07-07 DIAGNOSIS — I519 Heart disease, unspecified: Secondary | ICD-10-CM | POA: Diagnosis not present

## 2017-07-07 DIAGNOSIS — I10 Essential (primary) hypertension: Secondary | ICD-10-CM | POA: Diagnosis not present

## 2017-07-07 DIAGNOSIS — Z01818 Encounter for other preprocedural examination: Secondary | ICD-10-CM | POA: Diagnosis not present

## 2017-07-07 DIAGNOSIS — E119 Type 2 diabetes mellitus without complications: Secondary | ICD-10-CM | POA: Diagnosis not present

## 2017-07-07 NOTE — Progress Notes (Signed)
Stress test 06-03-17 epic (reviwed by cardiologist Dr Ida Rogue )  ekg 11-27-16 epic  [place] cardiology Dr Ida Rogue 11-27-16 epic   LOV Dr [name] 03-19-17 on chart from [place] on 03-17-17 on chart from [facility] : CMP, CBCDIFF, LIPID PANEL, TSH, HBGA1C , PSA

## 2017-07-07 NOTE — Patient Instructions (Signed)
Bobak Oguinn Hehl  07/07/2017   Your procedure is scheduled on: 07-13-17  Report to [facility] Main  Entrance    Report to admitting at [facility]   Call this number if you have problems the morning of surgery [phone]   Remember: ONLY 1 PERSON MAY GO WITH YOU TO SHORT STAY TO GET  READY MORNING OF YOUR SURGERY.    Do not eat food or drink liquids :After Midnight.     Take these medicines the morning of surgery with A SIP OF WATER: tylenol if needed, metoprolol, pantoprazole, atorvastatin                                 You may not have any metal on your body including hair pins and              piercings  Do not wear jewelry, make-up, lotions, powders or perfumes, deodorant                 Men may shave face and neck.   Do not bring valuables to the hospital. [place].  Contacts, dentures or bridgework may not be worn into surgery.  Leave suitcase in the car. After surgery it may be brought to your room.                 Please read over the following fact sheets you were given: _____________________________________________________________________            How to Manage Your Diabetes Before and After Surgery  Why is it important to control my blood sugar before and after surgery? . Improving blood sugar levels before and after surgery helps healing and can limit problems. . A way of improving blood sugar control is eating a healthy diet by: o  Eating less sugar and carbohydrates o  Increasing activity/exercise o  Talking with your doctor about reaching your blood sugar goals . High blood sugars (greater than 180 mg/dL) can raise your risk of infections and slow your recovery, so you will need to focus on controlling your diabetes during the weeks before surgery. . Make sure that the doctor who takes care of your diabetes knows about your planned surgery including the date and location.  How  do I manage my blood sugar before surgery? . Check your blood sugar at least 4 times a day, starting 2 days before surgery, to make sure that the level is not too high or low. o Check your blood sugar the morning of your surgery when you wake up and every 2 hours until you get to the Short Stay unit. . If your blood sugar is less than 70 mg/dL, you will need to treat for low blood sugar: o Do not take insulin. o Treat a low blood sugar (less than 70 mg/dL) with  cup of clear juice (cranberry or apple), 4 glucose tablets, OR glucose gel. o Recheck blood sugar in 15 minutes after treatment (to make sure it is greater than 70 mg/dL). If your blood sugar is not greater than 70 mg/dL on recheck, call [phone] for further instructions. . Report your blood sugar to the short stay nurse when you get to Short Stay.  . If you are  admitted to the hospital after surgery: o Your blood sugar will be checked by the staff and you will probably be given insulin after surgery (instead of oral diabetes medicines) to make sure you have good blood sugar levels. o The goal for blood sugar control after surgery is 80-180 mg/dL.   WHAT DO I DO ABOUT MY DIABETES MEDICATION?   . THE DAY BEFORE SURGERY, o Take METFORMIN as normal  o Take GLIMEPIRIDE  as normal       . THE MORNING OF SURGERY o Do not take oral diabetes medicines (pills) the morning of surgery!!    Patient Signature:  Date:   Nurse Signature:  Date:   Reviewed and Endorsed by [facility] Patient Education Committee, August 2015    [facility] - Preparing for Surgery Before surgery, you can play an important role.  Because skin is not sterile, your skin needs to be as free of germs as possible.  You can reduce the number of germs on your skin by washing with CHG (chlorahexidine gluconate) soap before surgery.  CHG is an antiseptic cleaner which kills germs and bonds with the skin to continue killing germs even after washing. Please DO  NOT use if you have an allergy to CHG or antibacterial soaps.  If your skin becomes reddened/irritated stop using the CHG and inform your nurse when you arrive at Short Stay. Do not shave (including legs and underarms) for at least 48 hours prior to the first CHG shower.  You may shave your face/neck. Please follow these instructions carefully:  1.  Shower with CHG Soap the night before surgery and the  morning of Surgery.  2.  If you choose to wash your hair, wash your hair first as usual with your  normal  shampoo.  3.  After you shampoo, rinse your hair and body thoroughly to remove the  shampoo.                           4.  Use CHG as you would any other liquid soap.  You can apply chg directly  to the skin and wash                       Gently with a scrungie or clean washcloth.  5.  Apply the CHG Soap to your body ONLY FROM THE NECK DOWN.   Do not use on face/ open                           Wound or open sores. Avoid contact with eyes, ears mouth and genitals (private parts).                       Wash face,  Genitals (private parts) with your normal soap.             6.  Wash thoroughly, paying special attention to the area where your surgery  will be performed.  7.  Thoroughly rinse your body with warm water from the neck down.  8.  DO NOT shower/wash with your normal soap after using and rinsing off  the CHG Soap.                9.  Pat yourself dry with a clean towel.            10.  Wear clean pajamas.  11.  Place clean sheets on your bed the night of your first shower and do not  sleep with pets. Day of Surgery : Do not apply any lotions/deodorants the morning of surgery.  Please wear clean clothes to the hospital/surgery center.  FAILURE TO FOLLOW THESE INSTRUCTIONS MAY RESULT IN THE CANCELLATION OF YOUR SURGERY PATIENT SIGNATURE_________________________________  NURSE  SIGNATURE__________________________________  ________________________________________________________________________   Adam Phenix  An incentive spirometer is a tool that can help keep your lungs clear and active. This tool measures how well you are filling your lungs with each breath. Taking long deep breaths may help reverse or decrease the chance of developing breathing (pulmonary) problems (especially infection) following:  A long period of time when you are unable to move or be active. BEFORE THE PROCEDURE   If the spirometer includes an indicator to show your [place] effort, your nurse or respiratory therapist will set it to a desired goal.  If possible, sit up straight or lean slightly forward. Try not to slouch.  Hold the incentive spirometer in an upright position. INSTRUCTIONS FOR USE  1. Sit on the edge of your bed if possible, or sit up as far as you can in bed or on a chair. 2. Hold the incentive spirometer in an upright position. 3. Breathe out normally. 4. Place the mouthpiece in your mouth and seal your lips tightly around it. 5. Breathe in slowly and as deeply as possible, raising the piston or the ball toward the top of the column. 6. Hold your breath for 3-5 seconds or for as long as possible. Allow the piston or ball to fall to the bottom of the column. 7. Remove the mouthpiece from your mouth and breathe out normally. 8. Rest for a few seconds and repeat Steps 1 through 7 at least 10 times every 1-2 hours when you are awake. Take your time and take a few normal breaths between deep breaths. 9. The spirometer may include an indicator to show your [place] effort. Use the indicator as a goal to work toward during each repetition. 10. After each set of 10 deep breaths, practice coughing to be sure your lungs are clear. If you have an incision (the cut made at the time of surgery), support your incision when coughing by placing a pillow or rolled up towels firmly  against it. Once you are able to get out of bed, walk around indoors and cough well. You may stop using the incentive spirometer when instructed by your caregiver.  RISKS AND COMPLICATIONS  Take your time so you do not get dizzy or light-headed.  If you are in pain, you may need to take or ask for pain medication before doing incentive spirometry. It is harder to take a deep breath if you are having pain. AFTER USE  Rest and breathe slowly and easily.  It can be helpful to keep track of a log of your progress. Your caregiver can provide you with a simple table to help with this. If you are using the spirometer at home, follow these instructions: [place] IF:   You are having difficultly using the spirometer.  You have trouble using the spirometer as often as instructed.  Your pain medication is not giving enough relief while using the spirometer.  You develop fever of 100.5 F (38.1 C) or higher. SEEK IMMEDIATE MEDICAL CARE IF:   You cough up bloody sputum that had not been present before.  You develop fever of 102 F (38.9 C)  or greater.  You develop worsening pain at or near the incision site. MAKE SURE YOU:   Understand these instructions.  Will watch your condition.  Will get help right away if you are not doing well or get worse. Document Released: 11/24/2006 Document Revised: 10/06/2011 Document Reviewed: 01/25/2007 ExitCare Patient Information 2014 ExitCare, [place].   ________________________________________________________________________  WHAT IS A BLOOD TRANSFUSION? Blood Transfusion Information  A transfusion is the replacement of blood or some of its parts. Blood is made up of multiple cells which provide different functions.  Red blood cells carry oxygen and are used for blood loss replacement.  White blood cells fight against infection.  Platelets control bleeding.  Plasma helps clot blood.  Other blood products are available for  specialized needs, such as hemophilia or other clotting disorders. BEFORE THE TRANSFUSION  Who gives blood for transfusions?   Healthy volunteers who are fully evaluated to make sure their blood is safe. This is blood bank blood. Transfusion therapy is the safest it has ever been in the practice of medicine. Before blood is taken from a donor, a complete history is taken to make sure that person has no history of diseases nor engages in risky social behavior (examples are intravenous drug use or sexual activity with multiple partners). The donor's travel history is screened to minimize risk of transmitting infections, such as malaria. The donated blood is tested for signs of infectious diseases, such as HIV and hepatitis. The blood is then tested to be sure it is compatible with you in order to minimize the chance of a transfusion reaction. If you or a relative donates blood, this is often done in anticipation of surgery and is not appropriate for emergency situations. It takes many days to process the donated blood. RISKS AND COMPLICATIONS Although transfusion therapy is very safe and saves many lives, the main dangers of transfusion include:   Getting an infectious disease.  Developing a transfusion reaction. This is an allergic reaction to something in the blood you were given. Every precaution is taken to prevent this. The decision to have a blood transfusion has been considered carefully by your caregiver before blood is given. Blood is not given unless the benefits outweigh the risks. AFTER THE TRANSFUSION  Right after receiving a blood transfusion, you will usually feel much better and more energetic. This is especially true if your red blood cells have gotten low (anemic). The transfusion raises the level of the red blood cells which carry oxygen, and this usually causes an energy increase.  The nurse administering the transfusion will monitor you carefully for complications. HOME CARE  INSTRUCTIONS  No special instructions are needed after a transfusion. You may find your energy is better. Speak with your caregiver about any limitations on activity for underlying diseases you may have. SEEK MEDICAL CARE IF:   Your condition is not improving after your transfusion.  You develop redness or irritation at the intravenous (IV) site. SEEK IMMEDIATE MEDICAL CARE IF:  Any of the following symptoms occur over the next 12 hours:  Shaking chills.  You have a temperature by mouth above 102 F (38.9 C), not controlled by medicine.  Chest, back, or muscle pain.  People around you feel you are not acting correctly or are confused.  Shortness of breath or difficulty breathing.  Dizziness and fainting.  You get a rash or develop hives.  You have a decrease in urine output.  Your urine turns a dark color or changes to pink, red,  or brown. Any of the following symptoms occur over the next 10 days:  You have a temperature by mouth above 102 F (38.9 C), not controlled by medicine.  Shortness of breath.  Weakness after normal activity.  The white part of the eye turns yellow (jaundice).  You have a decrease in the amount of urine or are urinating less often.  Your urine turns a dark color or changes to pink, red, or brown. Document Released: 07/11/2000 Document Revised: 10/06/2011 Document Reviewed: 02/28/2008 [facility] Patient Information 2014 [place], [place].  _______________________________________________________________________

## 2017-07-08 ENCOUNTER — Other Ambulatory Visit: Payer: Self-pay

## 2017-07-08 ENCOUNTER — Encounter (HOSPITAL_COMMUNITY)
Admission: RE | Admit: 2017-07-08 | Discharge: 2017-07-08 | Disposition: A | Discharge status: Home / Self Care | Payer: Medicare Other | Source: Ambulatory Visit | Attending: Orthopedic Surgery | Admitting: Orthopedic Surgery

## 2017-07-08 ENCOUNTER — Encounter (HOSPITAL_COMMUNITY): Payer: Self-pay

## 2017-07-08 DIAGNOSIS — M1711 Unilateral primary osteoarthritis, right knee: Secondary | ICD-10-CM | POA: Diagnosis not present

## 2017-07-08 DIAGNOSIS — Z01812 Encounter for preprocedural laboratory examination: Secondary | ICD-10-CM | POA: Insufficient documentation

## 2017-07-08 HISTORY — DX: Acute myocardial infarction, unspecified: I21.9

## 2017-07-08 LAB — COMPREHENSIVE METABOLIC PANEL
ALK PHOS: 33 U/L — AB (ref 38–126)
ALT: 22 U/L (ref 17–63)
AST: 24 U/L (ref 15–41)
Albumin: 4.2 g/dL (ref 3.5–5.0)
Anion gap: 10 (ref 5–15)
BUN: 16 mg/dL (ref 6–20)
CALCIUM: 9.2 mg/dL (ref 8.9–10.3)
CHLORIDE: 101 mmol/L (ref 101–111)
CO2: 25 mmol/L (ref 22–32)
CREATININE: 1.36 mg/dL — AB (ref 0.61–1.24)
GFR calc Af Amer: >60 mL/min (ref >60)
GFR calc non Af Amer: 52 mL/min — ABNORMAL LOW (ref >60)
GLUCOSE: 129 mg/dL — AB (ref 65–99)
Potassium: 4.7 mmol/L (ref 3.5–5.1)
SODIUM: 136 mmol/L (ref 135–145)
Total Bilirubin: 0.9 mg/dL (ref 0.3–1.2)
Total Protein: 7.2 g/dL (ref 6.5–8.1)

## 2017-07-08 LAB — CBC
HCT: 48.3 % (ref 39.0–52.0)
HEMOGLOBIN: 16.1 g/dL (ref 13.0–17.0)
MCH: 27.3 pg (ref 26.0–34.0)
MCHC: 33.3 g/dL (ref 30.0–36.0)
MCV: 82 fL (ref 78.0–100.0)
PLATELETS: 279 10*3/uL (ref 150–400)
RBC: 5.89 MIL/uL — AB (ref 4.22–5.81)
RDW: 15.5 % (ref 11.5–15.5)
WBC: 8.7 10*3/uL (ref 4.0–10.5)

## 2017-07-08 LAB — HEMOGLOBIN A1C
Hgb A1c MFr Bld: 7.4 % — ABNORMAL HIGH (ref 4.8–5.6)
Mean Plasma Glucose: 165.68 mg/dL

## 2017-07-08 LAB — SURGICAL PCR SCREEN
MRSA, PCR: NEGATIVE
STAPHYLOCOCCUS AUREUS: POSITIVE — AB

## 2017-07-08 LAB — GLUCOSE, CAPILLARY: GLUCOSE-CAPILLARY: 168 mg/dL — AB (ref 65–99)

## 2017-07-08 LAB — PROTIME-INR
INR: 1.02
Prothrombin Time: 13.3 seconds (ref 11.4–15.2)

## 2017-07-08 LAB — APTT: APTT: 29 s (ref 24–36)

## 2017-07-08 NOTE — Telephone Encounter (Signed)
Acceptable risk for knee surgery, no further testing needed

## 2017-07-08 NOTE — Progress Notes (Signed)
RN called and spoke with Mickel Baas at [facility] to request cardiac clearance from Dr Wanita Chamberlain who sent patient for stress test in 05-2017. Per Mickel Baas, she will send request to nurse and have nurse contact this RN . RN will continue to f/u.

## 2017-07-08 NOTE — Telephone Encounter (Signed)
Pre Surgical has not received any more notification if patient is cleared for knee replacement surgery on 12/17 after stress test Please contact by phone [phone] or by fax [phone]

## 2017-07-08 NOTE — Telephone Encounter (Signed)
Patient had myoview on 06/03/17. Routing to Dr Rockey Situ for clearance.

## 2017-07-09 NOTE — Telephone Encounter (Signed)
Clearance routed to Pre-Admission testing via [place] fax.

## 2017-07-09 NOTE — Progress Notes (Signed)
LOV/CLEARANCE Dr [name] 07-07-17 on chart   EKG 07-07-17 on chart from [facility] office

## 2017-07-09 NOTE — Progress Notes (Signed)
CARDIOLOGY CLEARANCE from Dr Ida Rogue [facility] on chart 07-08-17

## 2017-07-13 ENCOUNTER — Encounter (HOSPITAL_COMMUNITY): Payer: Self-pay | Admitting: *Deleted

## 2017-07-13 ENCOUNTER — Inpatient Hospital Stay (HOSPITAL_COMMUNITY): Payer: Medicare Other | Admitting: Anesthesiology

## 2017-07-13 ENCOUNTER — Ambulatory Visit (HOSPITAL_COMMUNITY)
Admission: RE | Admit: 2017-07-13 | Discharge: 2017-07-14 | Disposition: A | Discharge status: Home / Self Care | Payer: Medicare Other | Source: Ambulatory Visit | Attending: Orthopedic Surgery | Admitting: Orthopedic Surgery

## 2017-07-13 ENCOUNTER — Other Ambulatory Visit: Payer: Self-pay

## 2017-07-13 ENCOUNTER — Encounter (HOSPITAL_COMMUNITY)
Admission: RE | Disposition: A | Discharge status: Home / Self Care | Payer: Self-pay | Source: Ambulatory Visit | Attending: Orthopedic Surgery

## 2017-07-13 DIAGNOSIS — E7800 Pure hypercholesterolemia, unspecified: Secondary | ICD-10-CM | POA: Insufficient documentation

## 2017-07-13 DIAGNOSIS — M25761 Osteophyte, right knee: Secondary | ICD-10-CM | POA: Insufficient documentation

## 2017-07-13 DIAGNOSIS — M1710 Unilateral primary osteoarthritis, unspecified knee: Secondary | ICD-10-CM | POA: Diagnosis present

## 2017-07-13 DIAGNOSIS — I2510 Atherosclerotic heart disease of native coronary artery without angina pectoris: Secondary | ICD-10-CM | POA: Diagnosis not present

## 2017-07-13 DIAGNOSIS — Z7984 Long term (current) use of oral hypoglycemic drugs: Secondary | ICD-10-CM | POA: Diagnosis not present

## 2017-07-13 DIAGNOSIS — Z79899 Other long term (current) drug therapy: Secondary | ICD-10-CM | POA: Insufficient documentation

## 2017-07-13 DIAGNOSIS — I10 Essential (primary) hypertension: Secondary | ICD-10-CM | POA: Diagnosis not present

## 2017-07-13 DIAGNOSIS — I252 Old myocardial infarction: Secondary | ICD-10-CM | POA: Diagnosis not present

## 2017-07-13 DIAGNOSIS — E119 Type 2 diabetes mellitus without complications: Secondary | ICD-10-CM | POA: Insufficient documentation

## 2017-07-13 DIAGNOSIS — J449 Chronic obstructive pulmonary disease, unspecified: Secondary | ICD-10-CM | POA: Insufficient documentation

## 2017-07-13 DIAGNOSIS — M25561 Pain in right knee: Secondary | ICD-10-CM | POA: Diagnosis present

## 2017-07-13 DIAGNOSIS — F17210 Nicotine dependence, cigarettes, uncomplicated: Secondary | ICD-10-CM | POA: Insufficient documentation

## 2017-07-13 DIAGNOSIS — M1711 Unilateral primary osteoarthritis, right knee: Principal | ICD-10-CM | POA: Insufficient documentation

## 2017-07-13 DIAGNOSIS — M179 Osteoarthritis of knee, unspecified: Secondary | ICD-10-CM | POA: Diagnosis present

## 2017-07-13 DIAGNOSIS — G8918 Other acute postprocedural pain: Secondary | ICD-10-CM | POA: Diagnosis not present

## 2017-07-13 HISTORY — PX: TOTAL KNEE ARTHROPLASTY: SHX125

## 2017-07-13 LAB — GLUCOSE, CAPILLARY
GLUCOSE-CAPILLARY: 269 mg/dL — AB (ref 65–99)
GLUCOSE-CAPILLARY: 324 mg/dL — AB (ref 65–99)
GLUCOSE-CAPILLARY: 388 mg/dL — AB (ref 65–99)
Glucose-Capillary: 173 mg/dL — ABNORMAL HIGH (ref 65–99)
Glucose-Capillary: 152 mg/dL — ABNORMAL HIGH (ref 65–99)

## 2017-07-13 LAB — TYPE AND SCREEN
ABO/RH(D): O NEG
Antibody Screen: NEGATIVE

## 2017-07-13 SURGERY — ARTHROPLASTY, KNEE, TOTAL
Anesthesia: Spinal | Site: Knee | Laterality: Right

## 2017-07-13 MED ORDER — ACETAMINOPHEN 10 MG/ML IV SOLN
INTRAVENOUS | Status: AC
  Filled 2017-07-13: qty 100

## 2017-07-13 MED ORDER — ONDANSETRON HCL 4 MG/2ML IJ SOLN: 4.0000 mg | Freq: Four times a day (QID) | INTRAMUSCULAR | Status: DC | PRN

## 2017-07-13 MED ORDER — FENTANYL CITRATE (PF) 100 MCG/2ML IJ SOLN: 25.0000 ug | INTRAMUSCULAR | Status: DC | PRN

## 2017-07-13 MED ORDER — DEXAMETHASONE SODIUM PHOSPHATE 10 MG/ML IJ SOLN
INTRAMUSCULAR | Status: AC
  Filled 2017-07-13: qty 1

## 2017-07-13 MED ORDER — MEPERIDINE HCL 50 MG/ML IJ SOLN: 6.2500 mg | INTRAMUSCULAR | Status: DC | PRN

## 2017-07-13 MED ORDER — FENTANYL CITRATE (PF) 100 MCG/2ML IJ SOLN
INTRAMUSCULAR | Status: AC
  Administered 2017-07-13: 50 ug via INTRAVENOUS
  Filled 2017-07-13: qty 2

## 2017-07-13 MED ORDER — FLEET ENEMA 7-19 GM/118ML RE ENEM: 1.0000 | ENEMA | Freq: Once | RECTAL | Status: DC | PRN

## 2017-07-13 MED ORDER — OXYCODONE HCL 5 MG PO TABS
10.0000 mg | ORAL_TABLET | ORAL | Status: DC | PRN
  Administered 2017-07-13 – 2017-07-14 (×4): 10 mg via ORAL
  Filled 2017-07-13 (×4): qty 2

## 2017-07-13 MED ORDER — POLYETHYLENE GLYCOL 3350 17 G PO PACK: 17.0000 g | PACK | Freq: Every day | ORAL | Status: DC | PRN

## 2017-07-13 MED ORDER — CEFAZOLIN SODIUM-DEXTROSE 2-4 GM/100ML-% IV SOLN
2.0000 g | Freq: Four times a day (QID) | INTRAVENOUS | Status: AC
  Administered 2017-07-13 (×2): 2 g via INTRAVENOUS
  Filled 2017-07-13 (×2): qty 100

## 2017-07-13 MED ORDER — BUPIVACAINE LIPOSOME 1.3 % IJ SUSP
20.0000 mL | Freq: Once | INTRAMUSCULAR | Status: DC
  Filled 2017-07-13: qty 20

## 2017-07-13 MED ORDER — ONDANSETRON HCL 4 MG/2ML IJ SOLN
INTRAMUSCULAR | Status: DC | PRN
  Administered 2017-07-13: 4 mg via INTRAVENOUS

## 2017-07-13 MED ORDER — SODIUM CHLORIDE 0.9 % IJ SOLN
INTRAMUSCULAR | Status: DC | PRN
  Administered 2017-07-13: 60 mL

## 2017-07-13 MED ORDER — MIDAZOLAM HCL 2 MG/2ML IJ SOLN
INTRAMUSCULAR | Status: DC | PRN
  Administered 2017-07-13 (×2): 1 mg via INTRAVENOUS

## 2017-07-13 MED ORDER — DEXAMETHASONE SODIUM PHOSPHATE 10 MG/ML IJ SOLN
10.0000 mg | Freq: Once | INTRAMUSCULAR | Status: AC
  Administered 2017-07-14: 10 mg via INTRAVENOUS
  Filled 2017-07-13: qty 1

## 2017-07-13 MED ORDER — TRANEXAMIC ACID 1000 MG/10ML IV SOLN
2000.0000 mg | Freq: Once | INTRAVENOUS | Status: DC
  Filled 2017-07-13: qty 20

## 2017-07-13 MED ORDER — ACETAMINOPHEN 500 MG PO TABS
1000.0000 mg | ORAL_TABLET | Freq: Four times a day (QID) | ORAL | Status: DC
  Administered 2017-07-13 – 2017-07-14 (×3): 1000 mg via ORAL
  Filled 2017-07-13 (×3): qty 2

## 2017-07-13 MED ORDER — LACTATED RINGERS IV SOLN
INTRAVENOUS | Status: DC
  Administered 2017-07-13 (×2): via INTRAVENOUS

## 2017-07-13 MED ORDER — PROPOFOL 10 MG/ML IV BOLUS
INTRAVENOUS | Status: AC
  Filled 2017-07-13: qty 60

## 2017-07-13 MED ORDER — GABAPENTIN 300 MG PO CAPS
ORAL_CAPSULE | ORAL | Status: AC
  Administered 2017-07-13: 300 mg via ORAL
  Filled 2017-07-13: qty 1

## 2017-07-13 MED ORDER — BISACODYL 10 MG RE SUPP: 10.0000 mg | Freq: Every day | RECTAL | Status: DC | PRN

## 2017-07-13 MED ORDER — ACETAMINOPHEN 10 MG/ML IV SOLN
1000.0000 mg | Freq: Once | INTRAVENOUS | Status: AC
  Administered 2017-07-13: 1000 mg via INTRAVENOUS

## 2017-07-13 MED ORDER — DEXAMETHASONE SODIUM PHOSPHATE 10 MG/ML IJ SOLN
10.0000 mg | Freq: Once | INTRAMUSCULAR | Status: AC
  Administered 2017-07-13: 10 mg via INTRAVENOUS

## 2017-07-13 MED ORDER — SODIUM CHLORIDE 0.9 % IV SOLN
INTRAVENOUS | Status: AC | PRN
  Administered 2017-07-13: 2000 mg via TOPICAL

## 2017-07-13 MED ORDER — ACETAMINOPHEN 650 MG RE SUPP: 650.0000 mg | RECTAL | Status: DC | PRN

## 2017-07-13 MED ORDER — METOPROLOL TARTRATE 25 MG PO TABS
25.0000 mg | ORAL_TABLET | Freq: Two times a day (BID) | ORAL | Status: DC
  Administered 2017-07-13 – 2017-07-14 (×2): 25 mg via ORAL
  Filled 2017-07-13 (×2): qty 1

## 2017-07-13 MED ORDER — ACETAMINOPHEN 325 MG PO TABS: 650.0000 mg | ORAL_TABLET | ORAL | Status: DC | PRN

## 2017-07-13 MED ORDER — BUPIVACAINE LIPOSOME 1.3 % IJ SUSP
INTRAMUSCULAR | Status: DC | PRN
  Administered 2017-07-13: 20 mL

## 2017-07-13 MED ORDER — PANTOPRAZOLE SODIUM 40 MG PO TBEC
40.0000 mg | DELAYED_RELEASE_TABLET | Freq: Every day | ORAL | Status: DC
  Administered 2017-07-14: 40 mg via ORAL
  Filled 2017-07-13: qty 1

## 2017-07-13 MED ORDER — SODIUM CHLORIDE 0.9 % IV SOLN
INTRAVENOUS | Status: DC
  Administered 2017-07-13: 15:00:00 via INTRAVENOUS

## 2017-07-13 MED ORDER — MIDAZOLAM HCL 2 MG/2ML IJ SOLN
INTRAMUSCULAR | Status: AC
  Administered 2017-07-13: 1 mg via INTRAVENOUS
  Filled 2017-07-13: qty 2

## 2017-07-13 MED ORDER — LOSARTAN POTASSIUM 25 MG PO TABS
25.0000 mg | ORAL_TABLET | Freq: Every day | ORAL | Status: DC
  Administered 2017-07-13 – 2017-07-14 (×2): 25 mg via ORAL
  Filled 2017-07-13 (×2): qty 1

## 2017-07-13 MED ORDER — ATORVASTATIN CALCIUM 40 MG PO TABS
80.0000 mg | ORAL_TABLET | Freq: Every day | ORAL | Status: DC
  Administered 2017-07-14: 80 mg via ORAL
  Filled 2017-07-13: qty 2

## 2017-07-13 MED ORDER — GABAPENTIN 300 MG PO CAPS
300.0000 mg | ORAL_CAPSULE | Freq: Three times a day (TID) | ORAL | Status: DC
  Administered 2017-07-13 – 2017-07-14 (×3): 300 mg via ORAL
  Filled 2017-07-13 (×3): qty 1

## 2017-07-13 MED ORDER — CEFAZOLIN SODIUM-DEXTROSE 2-4 GM/100ML-% IV SOLN
INTRAVENOUS | Status: AC
  Filled 2017-07-13: qty 100

## 2017-07-13 MED ORDER — GABAPENTIN 300 MG PO CAPS
300.0000 mg | ORAL_CAPSULE | Freq: Once | ORAL | Status: AC
  Administered 2017-07-13: 300 mg via ORAL

## 2017-07-13 MED ORDER — METOCLOPRAMIDE HCL 5 MG/ML IJ SOLN: 10.0000 mg | Freq: Once | INTRAMUSCULAR | Status: DC | PRN

## 2017-07-13 MED ORDER — PHENOL 1.4 % MT LIQD
1.0000 | OROMUCOSAL | Status: DC | PRN
  Filled 2017-07-13: qty 177

## 2017-07-13 MED ORDER — METOCLOPRAMIDE HCL 5 MG/ML IJ SOLN: 5.0000 mg | Freq: Three times a day (TID) | INTRAMUSCULAR | Status: DC | PRN

## 2017-07-13 MED ORDER — OXYCODONE HCL 5 MG PO TABS
5.0000 mg | ORAL_TABLET | ORAL | Status: DC | PRN
  Administered 2017-07-13 (×2): 5 mg via ORAL
  Filled 2017-07-13 (×2): qty 1

## 2017-07-13 MED ORDER — SODIUM CHLORIDE 0.9 % IR SOLN
Status: DC | PRN
  Administered 2017-07-13: 1000 mL

## 2017-07-13 MED ORDER — 0.9 % SODIUM CHLORIDE (POUR BTL) OPTIME
TOPICAL | Status: DC | PRN
  Administered 2017-07-13: 1000 mL

## 2017-07-13 MED ORDER — MENTHOL 3 MG MT LOZG: 1.0000 | LOZENGE | OROMUCOSAL | Status: DC | PRN

## 2017-07-13 MED ORDER — FENTANYL CITRATE (PF) 100 MCG/2ML IJ SOLN
50.0000 ug | INTRAMUSCULAR | Status: DC
  Administered 2017-07-13 (×2): 50 ug via INTRAVENOUS

## 2017-07-13 MED ORDER — BUPIVACAINE IN DEXTROSE 0.75-8.25 % IT SOLN
INTRATHECAL | Status: DC | PRN
  Administered 2017-07-13: 1.5 mL via INTRATHECAL

## 2017-07-13 MED ORDER — PROPOFOL 500 MG/50ML IV EMUL
INTRAVENOUS | Status: DC | PRN
  Administered 2017-07-13: 50 ug/kg/min via INTRAVENOUS

## 2017-07-13 MED ORDER — CLOPIDOGREL BISULFATE 75 MG PO TABS
75.0000 mg | ORAL_TABLET | Freq: Every day | ORAL | Status: DC
  Administered 2017-07-14: 75 mg via ORAL
  Filled 2017-07-13: qty 1

## 2017-07-13 MED ORDER — METHOCARBAMOL 500 MG PO TABS
500.0000 mg | ORAL_TABLET | Freq: Four times a day (QID) | ORAL | Status: DC | PRN
Start: 2017-07-13 — End: 2017-07-14
  Administered 2017-07-13 – 2017-07-14 (×2): 500 mg via ORAL
  Filled 2017-07-13 (×3): qty 1

## 2017-07-13 MED ORDER — INSULIN ASPART 100 UNIT/ML ~~LOC~~ SOLN: 0.0000 [IU] | Freq: Three times a day (TID) | SUBCUTANEOUS | Status: DC

## 2017-07-13 MED ORDER — GLIMEPIRIDE 1 MG PO TABS
1.0000 mg | ORAL_TABLET | Freq: Every day | ORAL | Status: DC
  Administered 2017-07-14: 1 mg via ORAL
  Filled 2017-07-13: qty 1

## 2017-07-13 MED ORDER — ROPIVACAINE HCL 7.5 MG/ML IJ SOLN
INTRAMUSCULAR | Status: DC | PRN
  Administered 2017-07-13: 20 mL via PERINEURAL

## 2017-07-13 MED ORDER — LACTATED RINGERS IV SOLN
INTRAVENOUS | Status: DC
Start: 2017-07-13 — End: 2017-07-13

## 2017-07-13 MED ORDER — DIPHENHYDRAMINE HCL 12.5 MG/5ML PO ELIX: 12.5000 mg | ORAL_SOLUTION | ORAL | Status: DC | PRN

## 2017-07-13 MED ORDER — CHLORHEXIDINE GLUCONATE 4 % EX LIQD: 60.0000 mL | Freq: Once | CUTANEOUS | Status: DC

## 2017-07-13 MED ORDER — PROPOFOL 10 MG/ML IV BOLUS
INTRAVENOUS | Status: DC | PRN
  Administered 2017-07-13 (×2): 20 mg via INTRAVENOUS

## 2017-07-13 MED ORDER — ASPIRIN EC 325 MG PO TBEC
325.0000 mg | DELAYED_RELEASE_TABLET | Freq: Every day | ORAL | Status: DC
  Administered 2017-07-14: 325 mg via ORAL
  Filled 2017-07-13: qty 1

## 2017-07-13 MED ORDER — METOCLOPRAMIDE HCL 5 MG PO TABS: 5.0000 mg | ORAL_TABLET | Freq: Three times a day (TID) | ORAL | Status: DC | PRN

## 2017-07-13 MED ORDER — ONDANSETRON HCL 4 MG/2ML IJ SOLN
INTRAMUSCULAR | Status: AC
  Filled 2017-07-13: qty 2

## 2017-07-13 MED ORDER — CEFAZOLIN SODIUM-DEXTROSE 2-4 GM/100ML-% IV SOLN
2.0000 g | INTRAVENOUS | Status: AC
  Administered 2017-07-13: 2 g via INTRAVENOUS

## 2017-07-13 MED ORDER — DOCUSATE SODIUM 100 MG PO CAPS
100.0000 mg | ORAL_CAPSULE | Freq: Two times a day (BID) | ORAL | Status: DC
  Administered 2017-07-13 – 2017-07-14 (×2): 100 mg via ORAL
  Filled 2017-07-13 (×2): qty 1

## 2017-07-13 MED ORDER — ONDANSETRON HCL 4 MG PO TABS: 4.0000 mg | ORAL_TABLET | Freq: Four times a day (QID) | ORAL | Status: DC | PRN

## 2017-07-13 MED ORDER — METHOCARBAMOL 1000 MG/10ML IJ SOLN
500.0000 mg | Freq: Four times a day (QID) | INTRAVENOUS | Status: DC | PRN
  Filled 2017-07-13: qty 5

## 2017-07-13 MED ORDER — MORPHINE SULFATE (PF) 4 MG/ML IV SOLN: 1.0000 mg | INTRAVENOUS | Status: DC | PRN

## 2017-07-13 MED ORDER — MIDAZOLAM HCL 2 MG/2ML IJ SOLN
INTRAMUSCULAR | Status: AC
  Filled 2017-07-13: qty 2

## 2017-07-13 MED ORDER — SODIUM CHLORIDE 0.9 % IJ SOLN
INTRAMUSCULAR | Status: AC
  Filled 2017-07-13: qty 10

## 2017-07-13 MED ORDER — MIDAZOLAM HCL 2 MG/2ML IJ SOLN
1.0000 mg | INTRAMUSCULAR | Status: DC
  Administered 2017-07-13 (×2): 1 mg via INTRAVENOUS

## 2017-07-13 MED ORDER — SODIUM CHLORIDE 0.9 % IJ SOLN
INTRAMUSCULAR | Status: AC
  Filled 2017-07-13: qty 50

## 2017-07-13 SURGICAL SUPPLY — 50 items
BAG DECANTER FOR FLEXI CONT (MISCELLANEOUS) ×2 IMPLANT
BAG ZIPLOCK 12X15 (MISCELLANEOUS) ×2 IMPLANT
BANDAGE ACE 6X5 VEL STRL LF (GAUZE/BANDAGES/DRESSINGS) ×2 IMPLANT
BLADE SAG 18X100X1.27 (BLADE) ×2 IMPLANT
BLADE SAW SGTL 11.0X1.19X90.0M (BLADE) ×2 IMPLANT
BOWL SMART MIX CTS (DISPOSABLE) ×2 IMPLANT
CAPT KNEE TOTAL 3 ATTUNE ×2 IMPLANT
CEMENT HV SMART SET (Cement) ×4 IMPLANT
COVER SURGICAL LIGHT HANDLE (MISCELLANEOUS) ×2 IMPLANT
CUFF TOURN SGL QUICK 34 (TOURNIQUET CUFF) ×1
CUFF TRNQT CYL 34X4X40X1 (TOURNIQUET CUFF) ×1 IMPLANT
DECANTER SPIKE VIAL GLASS SM (MISCELLANEOUS) ×2 IMPLANT
DRAPE U-SHAPE 47X51 STRL (DRAPES) ×2 IMPLANT
DRSG ADAPTIC 3X8 NADH LF (GAUZE/BANDAGES/DRESSINGS) ×2 IMPLANT
DRSG PAD ABDOMINAL 8X10 ST (GAUZE/BANDAGES/DRESSINGS) IMPLANT
DURAPREP 26ML APPLICATOR (WOUND CARE) ×2 IMPLANT
ELECT REM PT RETURN 15FT ADLT (MISCELLANEOUS) ×2 IMPLANT
EVACUATOR 1/8 PVC DRAIN (DRAIN) ×2 IMPLANT
GAUZE SPONGE 4X4 12PLY STRL (GAUZE/BANDAGES/DRESSINGS) ×2 IMPLANT
GLOVE BIO SURGEON STRL SZ7.5 (GLOVE) IMPLANT
GLOVE BIO SURGEON STRL SZ8 (GLOVE) ×2 IMPLANT
GLOVE BIOGEL PI IND STRL 6.5 (GLOVE) IMPLANT
GLOVE BIOGEL PI IND STRL 8 (GLOVE) ×2 IMPLANT
GLOVE BIOGEL PI INDICATOR 6.5 (GLOVE)
GLOVE BIOGEL PI INDICATOR 8 (GLOVE) ×2
GLOVE SURG SS PI 6.5 STRL IVOR (GLOVE) IMPLANT
GOWN STRL REUS W/TWL LRG LVL3 (GOWN DISPOSABLE) ×6 IMPLANT
GOWN STRL REUS W/TWL XL LVL3 (GOWN DISPOSABLE) ×2 IMPLANT
HANDPIECE INTERPULSE COAX TIP (DISPOSABLE) ×1
IMMOBILIZER KNEE 20 (SOFTGOODS) ×2 IMPLANT
IMMOBILIZER KNEE 20 THIGH 36 (SOFTGOODS) IMPLANT
MANIFOLD NEPTUNE II (INSTRUMENTS) ×2 IMPLANT
NS IRRIG 1000ML POUR BTL (IV SOLUTION) ×2 IMPLANT
PACK TOTAL KNEE CUSTOM (KITS) ×2 IMPLANT
PAD ABD 8X10 STRL (GAUZE/BANDAGES/DRESSINGS) ×2 IMPLANT
PADDING CAST COTTON 6X4 STRL (CAST SUPPLIES) ×4 IMPLANT
POSITIONER SURGICAL ARM (MISCELLANEOUS) ×2 IMPLANT
SET HNDPC FAN SPRY TIP SCT (DISPOSABLE) ×1 IMPLANT
STRIP CLOSURE SKIN 1/2X4 (GAUZE/BANDAGES/DRESSINGS) ×4 IMPLANT
SUT MNCRL AB 4-0 PS2 18 (SUTURE) ×2 IMPLANT
SUT STRATAFIX 0 PDS 27 VIOLET (SUTURE) ×2
SUT VIC AB 2-0 CT1 27 (SUTURE) ×3
SUT VIC AB 2-0 CT1 TAPERPNT 27 (SUTURE) ×3 IMPLANT
SUTURE STRATFX 0 PDS 27 VIOLET (SUTURE) ×1 IMPLANT
SYR 30ML LL (SYRINGE) ×4 IMPLANT
SYR 50ML LL SCALE MARK (SYRINGE) ×2 IMPLANT
TRAY FOLEY W/METER SILVER 16FR (SET/KITS/TRAYS/PACK) ×2 IMPLANT
WATER STERILE IRR 1000ML POUR (IV SOLUTION) ×2 IMPLANT
WRAP KNEE MAXI GEL POST OP (GAUZE/BANDAGES/DRESSINGS) ×2 IMPLANT
YANKAUER SUCT BULB TIP 10FT TU (MISCELLANEOUS) ×2 IMPLANT

## 2017-07-13 NOTE — Anesthesia Procedure Notes (Signed)
Procedure Name: MAC Date/Time: 07/13/2017 10:48 AM Performed by: Dione Booze, CRNA Pre-anesthesia Checklist: Emergency Drugs available, Patient identified, Suction available and Patient being monitored Patient Re-evaluated:Patient Re-evaluated prior to induction Oxygen Delivery Method: Simple face mask Placement Confirmation: positive ETCO2

## 2017-07-13 NOTE — Evaluation (Signed)
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 Pt ambulated 13' with RW and performed TKA exercises with min assist. Good progress expected.  Pt will benefit from skilled PT to increase their independence and safety with mobility to allow discharge to the venue listed below.      Follow Up Recommendations DC plan and follow up therapy as arranged by surgeon    Equipment Recommendations  None recommended by PT    Recommendations for Other Services       Precautions / Restrictions Precautions Precautions: Knee Restrictions Weight Bearing Restrictions: No Other Position/Activity Restrictions: WBAT      Mobility  Bed Mobility Overal bed mobility: Modified Independent             General bed mobility comments: HOB up  Transfers Overall transfer level: Needs assistance Equipment used: Rolling walker (2 wheeled) Transfers: Sit to/from Stand Sit to Stand: Min guard         General transfer comment: VCs hand placement  Ambulation/Gait Ambulation/Gait assistance: Min guard Ambulation Distance (Feet): 50 Feet Assistive device: Rolling walker (2 wheeled) Gait Pattern/deviations: Step-to pattern;Antalgic   Gait velocity interpretation: Below normal speed for age/gender General Gait Details: steady with RW, no LOB, VCs sequencing  Stairs            Wheelchair Mobility    Modified Rankin (Stroke Patients Only)       Balance Overall balance assessment: Modified Independent                                           Pertinent Vitals/Pain Pain Assessment: 0-10 Pain Score: 4  Pain Location: R knee Pain Descriptors / Indicators: Sore Pain Intervention(s): Limited activity within patient's  tolerance;Monitored during session;Premedicated before session;Repositioned;Ice applied    Home Living Family/patient expects to be discharged to:: Private residence Living Arrangements: Spouse/significant other Available Help at Discharge: Family;Available 24 hours/day Type of Home: House Home Access: Stairs to enter Entrance Stairs-Rails: Right;Left;Can reach both Entrance Stairs-Number of Steps: 5 Home Layout: Able to live on main level with bedroom/bathroom;Two level Home Equipment: Walker - 2 wheels;Shower seat;Bedside commode;Hand held shower head;Adaptive equipment      Prior Function Level of Independence: Independent               Hand Dominance        Extremity/Trunk Assessment   Upper Extremity Assessment Upper Extremity Assessment: Overall WFL for tasks assessed    Lower Extremity Assessment Lower Extremity Assessment: RLE deficits/detail RLE Deficits / Details: 0-80* AAROM R knee, SLR 3/5    Cervical / Trunk Assessment Cervical / Trunk Assessment: Normal  Communication   Communication: No difficulties  Cognition Arousal/Alertness: Awake/alert Behavior During Therapy: WFL for tasks assessed/performed Overall Cognitive Status: Within Functional Limits for tasks assessed                                        General Comments      Exercises Total Joint Exercises Ankle Circles/Pumps: AROM;Both;10 reps;Supine Quad Sets: AROM;Both;10 reps;Supine Heel Slides: AAROM;Right;10 reps;Supine Straight Leg Raises: AROM;Right;5 reps;Supine  Long Arc Quad: AROM;Right;5 reps;Seated Goniometric ROM: 0-80* AAROM    Assessment/Plan    PT Assessment Patient needs continued PT services  PT Problem List Decreased strength;Decreased range of motion;Decreased activity tolerance;Pain;Decreased mobility       PT Treatment Interventions      PT Goals (Current goals can be found in the Care Plan section)  Acute Rehab PT Goals Patient Stated Goal:  to be able to walk PT Goal Formulation: With patient/family Time For Goal Achievement: 07/20/17 Potential to Achieve Goals: Good    Frequency 7X/week   Barriers to discharge        Co-evaluation               AM-PAC PT "6 Clicks" Daily Activity  Outcome Measure Difficulty turning over in bed (including adjusting bedclothes, sheets and blankets)?: A Little Difficulty moving from lying on back to sitting on the side of the bed? : A Little Difficulty sitting down on and standing up from a chair with arms (e.g., wheelchair, bedside commode, etc,.)?: A Little Help needed moving to and from a bed to chair (including a wheelchair)?: A Little Help needed walking in hospital room?: A Little Help needed climbing 3-5 steps with a railing? : Total 6 Click Score: 16    End of Session Equipment Utilized During Treatment: Gait belt;Right knee immobilizer Activity Tolerance: Patient tolerated treatment well Patient left: in chair;with call bell/phone within reach;with chair alarm set;with family/visitor present Nurse Communication: Mobility status PT Visit Diagnosis: Difficulty in walking, not elsewhere classified (R26.2);Muscle weakness (generalized) (M62.81);Pain Pain - Right/Left: Right Pain - part of body: Knee    Time: 2263-3354 PT Time Calculation (min) (ACUTE ONLY): 29 min   Charges:   PT Evaluation $PT Eval Low Complexity: 1 Low PT Treatments $Gait Training: 8-22 mins   PT G Codes:          Philomena Doheny 07/13/2017, 4:58 PM [phone]

## 2017-07-13 NOTE — Op Note (Signed)
OPERATIVE REPORT-TOTAL KNEE ARTHROPLASTY   Pre-operative diagnosis- Osteoarthritis  Right knee(s)  Post-operative diagnosis- Osteoarthritis Right knee(s)  Procedure-  Right  Total Knee Arthroplasty  Surgeon- Dione Plover. Arber Wiemers, MD  Assistant- Arlee Muslim, PA-C   Anesthesia-  Adductor canal block and spinal  EBL- 25 ml   Drains Hemovac  Tourniquet time-  Total Tourniquet Time Documented: Thigh (Right) - 37 minutes Total: Thigh (Right) - 37 minutes     Complications- None  Condition-PACU - hemodynamically stable.   Brief Clinical Note  Joshua Roy is a 66 y.o. year old male with end stage OA of his right knee with progressively worsening pain and dysfunction. He has constant pain, with activity and at rest and significant functional deficits with difficulties even with ADLs. He has had extensive non-op management including analgesics, injections of cortisone and viscosupplements, and home exercise program, but remains in significant pain with significant dysfunction. Radiographs show bone on bone arthritis medial and patellofemoral. He presents now for right Total Knee Arthroplasty.    Procedure in detail---   The patient is brought into the operating room and positioned supine on the operating table. After successful administration of  Adductor canal block and spinal,   a tourniquet is placed high on the  Right thigh(s) and the lower extremity is prepped and draped in the usual sterile fashion. Time out is performed by the operating team and then the  Right lower extremity is wrapped in Esmarch, knee flexed and the tourniquet inflated to 300 mmHg.       A midline incision is made with a ten blade through the subcutaneous tissue to the level of the extensor mechanism. A fresh blade is used to make a medial parapatellar arthrotomy. Soft tissue over the proximal medial tibia is subperiosteally elevated to the joint line with a knife and into the semimembranosus bursa with a  Cobb elevator. Soft tissue over the proximal lateral tibia is elevated with attention being paid to avoiding the patellar tendon on the tibial tubercle. The patella is everted, knee flexed 90 degrees and the ACL and PCL are removed. Findings are bone on bone medial and patellofemoral with large global osteophytes.        The drill is used to create a starting hole in the distal femur and the canal is thoroughly irrigated with sterile saline to remove the fatty contents. The 5 degree Right  valgus alignment guide is placed into the femoral canal and the distal femoral cutting block is pinned to remove 10 mm off the distal femur. Resection is made with an oscillating saw.      The tibia is subluxed forward and the menisci are removed. The extramedullary alignment guide is placed referencing proximally at the medial aspect of the tibial tubercle and distally along the second metatarsal axis and tibial crest. The block is pinned to remove 61mm off the more deficient medial  side. Resection is made with an oscillating saw. Size 8is the most appropriate size for the tibia and the proximal tibia is prepared with the modular drill and keel punch for that size.      The femoral sizing guide is placed and size 8 is most appropriate. Rotation is marked off the epicondylar axis and confirmed by creating a rectangular flexion gap at 90 degrees. The size 8 cutting block is pinned in this rotation and the anterior, posterior and chamfer cuts are made with the oscillating saw. The intercondylar block is then placed and that cut is made.  Trial size 8 tibial component, trial size 8 posterior stabilized femur and a 8  mm posterior stabilized rotating platform insert trial is placed. Full extension is achieved with excellent varus/valgus and anterior/posterior balance throughout full range of motion. The patella is everted and thickness measured to be 27  mm. Free hand resection is taken to 15 mm, a 41 template is placed, lug  holes are drilled, trial patella is placed, and it tracks normally. Osteophytes are removed off the posterior femur with the trial in place. All trials are removed and the cut bone surfaces prepared with pulsatile lavage. Cement is mixed and once ready for implantation, the size 8 tibial implant, size  8 posterior stabilized femoral component, and the size 41 patella are cemented in place and the patella is held with the clamp. The trial insert is placed and the knee held in full extension. The Exparel (20 ml mixed with 60 ml saline) is injected into the extensor mechanism, posterior capsule, medial and lateral gutters and subcutaneous tissues.  All extruded cement is removed and once the cement is hard the permanent 8 mm posterior stabilized rotating platform insert is placed into the tibial tray.      The wound is copiously irrigated with saline solution and the extensor mechanism closed over a hemovac drain with #1 V-loc suture. The tourniquet is released for a total tourniquet time of 37  minutes. Flexion against gravity is 140 degrees and the patella tracks normally. Subcutaneous tissue is closed with 2.0 vicryl and subcuticular with running 4.0 Monocryl. The incision is cleaned and dried and steri-strips and a bulky sterile dressing are applied. The limb is placed into a knee immobilizer and the patient is awakened and transported to recovery in stable condition.      Please note that a surgical assistant was a medical necessity for this procedure in order to perform it in a safe and expeditious manner. Surgical assistant was necessary to retract the ligaments and vital neurovascular structures to prevent injury to them and also necessary for proper positioning of the limb to allow for anatomic placement of the prosthesis.   Dione Plover Joshua Shetley, MD    07/13/2017, 11:49 AM

## 2017-07-13 NOTE — Interval H&P Note (Signed)
History and Physical Interval Note:  07/13/2017 8:32 AM  Joshua Roy  has presented today for surgery, with the diagnosis of Osteoarthritis Right knee   The various methods of treatment have been discussed with the patient and family. After consideration of risks, benefits and other options for treatment, the patient has consented to  Procedure(s): RIGHT TOTAL KNEE ARTHROPLASTY (Right) as a surgical intervention .  The patient's history has been reviewed, patient examined, no change in status, stable for surgery.  I have reviewed the patient's chart and labs.  Questions were answered to the patient's satisfaction.     Pilar Plate Adri Schloss

## 2017-07-13 NOTE — Anesthesia Preprocedure Evaluation (Signed)
Anesthesia Evaluation  Patient identified by MRN, date of birth, ID band Patient awake    Reviewed: Allergy & Precautions, NPO status , Patient's Chart, lab work & pertinent test results  Airway Mallampati: II  TM Distance: >3 FB Neck ROM: Full    Dental no notable dental hx.    Pulmonary COPD, Current Smoker,    Pulmonary exam normal breath sounds clear to auscultation       Cardiovascular hypertension, + CAD and + Past MI  Normal cardiovascular exam Rhythm:Regular Rate:Normal     Neuro/Psych negative neurological ROS  negative psych ROS   GI/Hepatic negative GI ROS, Neg liver ROS,   Endo/Other  diabetes  Renal/GU negative Renal ROS  negative genitourinary   Musculoskeletal negative musculoskeletal ROS (+)   Abdominal   Peds negative pediatric ROS (+)  Hematology negative hematology ROS (+)   Anesthesia Other Findings   Reproductive/Obstetrics negative OB ROS                            Anesthesia Physical Anesthesia Plan  ASA: III  Anesthesia Plan: Spinal   Post-op Pain Management:  Regional for Post-op pain   Induction:   PONV Risk Score and Plan: 0  Airway Management Planned: Simple Face Mask  Additional Equipment:   Intra-op Plan:   Post-operative Plan:   Informed Consent: I have reviewed the patients History and Physical, chart, labs and discussed the procedure including the risks, benefits and alternatives for the proposed anesthesia with the patient or authorized representative who has indicated his/her understanding and acceptance.   Dental advisory given  Plan Discussed with:   Anesthesia Plan Comments:         Anesthesia Quick Evaluation

## 2017-07-13 NOTE — Anesthesia Procedure Notes (Signed)
Spinal  Patient location during procedure: OR Staffing Anesthesiologist: Montez Hageman, MD Performed: anesthesiologist  Preanesthetic Checklist Completed: patient identified, site marked, surgical consent, pre-op evaluation, timeout performed, IV checked, risks and benefits discussed and monitors and equipment checked Spinal Block Patient position: sitting Prep: DuraPrep Patient monitoring: heart rate, continuous pulse ox and blood pressure Approach: right paramedian Location: L4-5 Injection technique: single-shot Needle Needle type: Sprotte  Needle gauge: 24 G Needle length: 9 cm Additional Notes Expiration date of kit checked and confirmed. Patient tolerated procedure well, without complications.

## 2017-07-13 NOTE — Anesthesia Procedure Notes (Signed)
Anesthesia Regional Block: Adductor canal block   Pre-Anesthetic Checklist: ,, timeout performed, Correct Patient, Correct Site, Correct Laterality, Correct Procedure, Correct Position, site marked, Risks and benefits discussed,  Surgical consent,  Pre-op evaluation,  At surgeon's request and post-op pain management  Laterality: Right and Lower  Prep: Maximum Sterile Barrier Precautions used, chloraprep       Needles:  Injection technique: Single-shot  Needle Type: Echogenic Stimulator Needle     Needle Length: 10cm      Additional Needles:   Procedures:,,,, ultrasound used (permanent image in chart),,,,  Narrative:  Start time: 07/13/2017 10:13 AM End time: 07/13/2017 10:23 AM Injection made incrementally with aspirations every 5 mL.  Performed by: Personally  Anesthesiologist: Montez Hageman, MD  Additional Notes: Risks, benefits and alternative to block explained extensively.  Patient tolerated procedure well, without complications.

## 2017-07-13 NOTE — Anesthesia Postprocedure Evaluation (Signed)
Anesthesia Post Note  Patient: Joshua Roy  Procedure(s) Performed: RIGHT TOTAL KNEE ARTHROPLASTY (Right Knee)     Patient location during evaluation: PACU Anesthesia Type: Spinal Level of consciousness: awake and alert Pain management: pain level controlled Vital Signs Assessment: post-procedure vital signs reviewed and stable Respiratory status: spontaneous breathing, nonlabored ventilation, respiratory function stable and patient connected to nasal cannula oxygen Cardiovascular status: blood pressure returned to baseline and stable Postop Assessment: no apparent nausea or vomiting Anesthetic complications: no    Last Vitals:  Vitals:   07/13/17 1458 07/13/17 1555  BP: 135/76 (!) 164/87  Pulse: 65 77  Resp: 16 16  Temp: (!) 36.1 C 36.7 C  SpO2: 96% 94%    Last Pain:  Vitals:   07/13/17 1646  TempSrc:   PainSc: 8                  Montez Hageman

## 2017-07-13 NOTE — Plan of Care (Signed)
Plan of care reviewed with patient.  Verbalizes u/o plan.

## 2017-07-13 NOTE — Progress Notes (Signed)
AssistedDr. Carignan with right, ultrasound guided, adductor canal block. Side rails up, monitors on throughout procedure. See vital signs in flow sheet. Tolerated Procedure well.  

## 2017-07-13 NOTE — Transfer of Care (Signed)
Immediate Anesthesia Transfer of Care Note  Patient: Joshua Bonura Trebilcock  Procedure(s) Performed: RIGHT TOTAL KNEE ARTHROPLASTY (Right Knee)  Patient Location: PACU  Anesthesia Type:Spinal and MAC combined with regional for post-op pain  Level of Consciousness: awake, drowsy and responds to stimulation  Airway & Oxygen Therapy: Patient Spontanous Breathing and Patient connected to face mask oxygen  Post-op Assessment: Report given to RN and Post -op Vital signs reviewed and stable  Post vital signs: Reviewed and stable  Last Vitals:  Vitals:   07/13/17 1020 07/13/17 1025  BP: (!) 169/78 (!) 162/76  Pulse: 63 60  Resp:  14  Temp:    SpO2: 94% 95%    Last Pain:  Vitals:   07/13/17 0839  TempSrc: Oral         Complications: No apparent anesthesia complications

## 2017-07-14 DIAGNOSIS — I10 Essential (primary) hypertension: Secondary | ICD-10-CM | POA: Diagnosis not present

## 2017-07-14 DIAGNOSIS — M25761 Osteophyte, right knee: Secondary | ICD-10-CM | POA: Diagnosis not present

## 2017-07-14 DIAGNOSIS — E119 Type 2 diabetes mellitus without complications: Secondary | ICD-10-CM | POA: Diagnosis not present

## 2017-07-14 DIAGNOSIS — I252 Old myocardial infarction: Secondary | ICD-10-CM | POA: Diagnosis not present

## 2017-07-14 DIAGNOSIS — M1711 Unilateral primary osteoarthritis, right knee: Secondary | ICD-10-CM | POA: Diagnosis not present

## 2017-07-14 DIAGNOSIS — I2510 Atherosclerotic heart disease of native coronary artery without angina pectoris: Secondary | ICD-10-CM | POA: Diagnosis not present

## 2017-07-14 LAB — CBC
HEMATOCRIT: 43.7 % (ref 39.0–52.0)
Hemoglobin: 14.1 g/dL (ref 13.0–17.0)
MCH: 26.7 pg (ref 26.0–34.0)
MCHC: 32.3 g/dL (ref 30.0–36.0)
MCV: 82.6 fL (ref 78.0–100.0)
Platelets: 281 10*3/uL (ref 150–400)
RBC: 5.29 MIL/uL (ref 4.22–5.81)
RDW: 15.4 % (ref 11.5–15.5)
WBC: 18.3 10*3/uL — ABNORMAL HIGH (ref 4.0–10.5)

## 2017-07-14 LAB — BASIC METABOLIC PANEL
Anion gap: 7 (ref 5–15)
BUN: 22 mg/dL — AB (ref 6–20)
CALCIUM: 8.9 mg/dL (ref 8.9–10.3)
CO2: 27 mmol/L (ref 22–32)
CREATININE: 1.19 mg/dL (ref 0.61–1.24)
Chloride: 101 mmol/L (ref 101–111)
GFR calc Af Amer: >60 mL/min (ref >60)
GFR calc non Af Amer: >60 mL/min (ref >60)
GLUCOSE: 250 mg/dL — AB (ref 65–99)
Potassium: 4.6 mmol/L (ref 3.5–5.1)
Sodium: 135 mmol/L (ref 135–145)

## 2017-07-14 LAB — GLUCOSE, CAPILLARY
Glucose-Capillary: 191 mg/dL — ABNORMAL HIGH (ref 65–99)
Glucose-Capillary: 305 mg/dL — ABNORMAL HIGH (ref 65–99)

## 2017-07-14 MED ORDER — OXYCODONE HCL 5 MG PO TABS: 5.0000 mg | ORAL_TABLET | ORAL | 0 refills | Status: DC | PRN

## 2017-07-14 MED ORDER — TRAMADOL HCL 50 MG PO TABS
50.0000 mg | ORAL_TABLET | Freq: Four times a day (QID) | ORAL | 1 refills | Status: DC | PRN
Start: 2017-07-14 — End: 2019-09-01

## 2017-07-14 MED ORDER — GABAPENTIN 300 MG PO CAPS: 300.0000 mg | ORAL_CAPSULE | Freq: Three times a day (TID) | ORAL | 0 refills | Status: DC

## 2017-07-14 MED ORDER — ASPIRIN 325 MG PO TBEC: 325.0000 mg | DELAYED_RELEASE_TABLET | Freq: Every day | ORAL | 0 refills | Status: DC

## 2017-07-14 MED ORDER — METHOCARBAMOL 500 MG PO TABS: 500.0000 mg | ORAL_TABLET | Freq: Four times a day (QID) | ORAL | 0 refills | Status: DC | PRN

## 2017-07-14 NOTE — Care Management CC44 (Signed)
Condition Code 44 Documentation Completed  Patient Details  Name: Joshua Roy MRN: [id number] Date of Birth: [birth year]   Condition Code 44 given:    Patient signature on Condition Code 44 notice:    Documentation of 2 MD's agreement:    Code 44 added to claim:       Guadalupe Maple, RN 07/14/2017, 2:25 PM

## 2017-07-14 NOTE — Care Management Obs Status (Signed)
[place] NOTIFICATION   Patient Details  Name: Joshua Roy MRN: [id number] Date of Birth: 10/16/1950   Medicare Observation Status Notification Given:  Yes    Guadalupe Maple, RN 07/14/2017, 12:39 PM

## 2017-07-14 NOTE — Progress Notes (Signed)
Discharge planning, no HH needs identified. Plan for OP PT, has DME. [phone]

## 2017-07-14 NOTE — Discharge Summary (Signed)
Physician Discharge Summary   Patient ID: Joshua Roy MRN: [id number] DOB/AGE: 03/22/1951 66 y.o.  Admit date: 07/13/2017 Discharge date: 07-14-2017  Primary Diagnosis:  Osteoarthritis Right knee(s)  Admission Diagnoses:  Past Medical History:  Diagnosis Date  . ACE-inhibitor cough   . Anxiety   . Bronchitis 06/27/2016  . COPD (chronic obstructive pulmonary disease) (HCC)    cath, mild inf. hypokinesis EF 49%, 100% ROA  . Coronary artery disease 2001  . Coronary atherosclerosis   . Diabetes mellitus type II   . Hyperlipidemia   . Hypertension   . MI (myocardial infarction) ([place]) age 11  . OA (osteoarthritis)    knee OA, injected 2012 by ortho  . Pneumonia    Discharge Diagnoses:   Active Problems:   OA (osteoarthritis) of knee  Estimated body mass index is 35.3 kg/m as calculated from the following:   Height as of this encounter: 5' 10"  (1.778 m).   Weight as of this encounter: 111.6 kg (246 lb).  Procedure:  Procedure(s) (LRB): RIGHT TOTAL KNEE ARTHROPLASTY (Right)   Consults: None  HPI: Joshua Roy is a 66 y.o. year old male with end stage OA of his right knee with progressively worsening pain and dysfunction. He has constant pain, with activity and at rest and significant functional deficits with difficulties even with ADLs. He has had extensive non-op management including analgesics, injections of cortisone and viscosupplements, and home exercise program, but remains in significant pain with significant dysfunction. Radiographs show bone on bone arthritis medial and patellofemoral. He presents now for right Total Knee Arthroplasty.     Laboratory Data: Admission on 07/13/2017  Component Date Value Ref Range Status  . Glucose-Capillary 07/13/2017 173* 65 - 99 mg/dL Final  . Comment 1 07/13/2017 Notify RN   Final  . Glucose-Capillary 07/13/2017 152* 65 - 99 mg/dL Final  . Comment 1 07/13/2017 Notify RN   Final  . Comment 2 07/13/2017 Document  in Chart   Final  . Glucose-Capillary 07/13/2017 269* 65 - 99 mg/dL Final  . WBC 07/14/2017 18.3* 4.0 - 10.5 K/uL Final  . RBC 07/14/2017 5.29  4.22 - 5.81 MIL/uL Final  . Hemoglobin 07/14/2017 14.1  13.0 - 17.0 g/dL Final  . HCT 07/14/2017 43.7  39.0 - 52.0 % Final  . MCV 07/14/2017 82.6  78.0 - 100.0 fL Final  . MCH 07/14/2017 26.7  26.0 - 34.0 pg Final  . MCHC 07/14/2017 32.3  30.0 - 36.0 g/dL Final  . RDW 07/14/2017 15.4  11.5 - 15.5 % Final  . Platelets 07/14/2017 281  150 - 400 K/uL Final  . Sodium 07/14/2017 135  135 - 145 mmol/L Final  . Potassium 07/14/2017 4.6  3.5 - 5.1 mmol/L Final  . Chloride 07/14/2017 101  101 - 111 mmol/L Final  . CO2 07/14/2017 27  22 - 32 mmol/L Final  . Glucose, Bld 07/14/2017 250* 65 - 99 mg/dL Final  . BUN 07/14/2017 22* 6 - 20 mg/dL Final  . Creatinine, Ser 07/14/2017 1.19  0.61 - 1.24 mg/dL Final  . Calcium 07/14/2017 8.9  8.9 - 10.3 mg/dL Final  . GFR calc non Af Amer 07/14/2017 >60  >60 mL/min Final  . GFR calc Af Amer 07/14/2017 >60  >60 mL/min Final   Comment: (NOTE) The eGFR has been calculated using the CKD EPI equation. This calculation has not been validated in all clinical situations. eGFR's persistently <60 mL/min signify possible Chronic Kidney Disease.   Georgiann Hahn gap 07/14/2017  7  5 - 15 Final  . Glucose-Capillary 07/13/2017 324* 65 - 99 mg/dL Final  . Glucose-Capillary 07/13/2017 388* 65 - 99 mg/dL Final  . Glucose-Capillary 07/14/2017 191* 65 - 99 mg/dL Final  Hospital Outpatient Visit on 07/08/2017  Component Date Value Ref Range Status  . Glucose-Capillary 07/08/2017 168* 65 - 99 mg/dL Final  . Hgb A1c MFr Bld 07/08/2017 7.4* 4.8 - 5.6 % Final   Comment: (NOTE) Pre diabetes:          5.7%-6.4% Diabetes:              >6.4% Glycemic control for   <7.0% adults with diabetes   . Mean Plasma Glucose 07/08/2017 165.68  mg/dL Final   Performed at [facility] [address], [place], [place] [address]  . MRSA, PCR  07/08/2017 NEGATIVE  NEGATIVE Final  . Staphylococcus aureus 07/08/2017 POSITIVE* NEGATIVE Final   Comment: (NOTE) The Xpert SA Assay (FDA approved for NASAL specimens in patients 68 years of age and older), is one component of a comprehensive surveillance program. It is not intended to diagnose infection nor to guide or monitor treatment.   Marland Kitchen aPTT 07/08/2017 29  24 - 36 seconds Final  . WBC 07/08/2017 8.7  4.0 - 10.5 K/uL Final  . RBC 07/08/2017 5.89* 4.22 - 5.81 MIL/uL Final  . Hemoglobin 07/08/2017 16.1  13.0 - 17.0 g/dL Final  . HCT 07/08/2017 48.3  39.0 - 52.0 % Final  . MCV 07/08/2017 82.0  78.0 - 100.0 fL Final  . MCH 07/08/2017 27.3  26.0 - 34.0 pg Final  . MCHC 07/08/2017 33.3  30.0 - 36.0 g/dL Final  . RDW 07/08/2017 15.5  11.5 - 15.5 % Final  . Platelets 07/08/2017 279  150 - 400 K/uL Final  . Sodium 07/08/2017 136  135 - 145 mmol/L Final  . Potassium 07/08/2017 4.7  3.5 - 5.1 mmol/L Final  . Chloride 07/08/2017 101  101 - 111 mmol/L Final  . CO2 07/08/2017 25  22 - 32 mmol/L Final  . Glucose, Bld 07/08/2017 129* 65 - 99 mg/dL Final  . BUN 07/08/2017 16  6 - 20 mg/dL Final  . Creatinine, Ser 07/08/2017 1.36* 0.61 - 1.24 mg/dL Final  . Calcium 07/08/2017 9.2  8.9 - 10.3 mg/dL Final  . Total Protein 07/08/2017 7.2  6.5 - 8.1 g/dL Final  . Albumin 07/08/2017 4.2  3.5 - 5.0 g/dL Final  . AST 07/08/2017 24  15 - 41 U/L Final  . ALT 07/08/2017 22  17 - 63 U/L Final  . Alkaline Phosphatase 07/08/2017 33* 38 - 126 U/L Final  . Total Bilirubin 07/08/2017 0.9  0.3 - 1.2 mg/dL Final  . GFR calc non Af Amer 07/08/2017 52* >60 mL/min Final  . GFR calc Af Amer 07/08/2017 >60  >60 mL/min Final   Comment: (NOTE) The eGFR has been calculated using the CKD EPI equation. This calculation has not been validated in all clinical situations. eGFR's persistently <60 mL/min signify possible Chronic Kidney Disease.   . Anion gap 07/08/2017 10  5 - 15 Final  . Prothrombin Time 07/08/2017  13.3  11.4 - 15.2 seconds Final  . INR 07/08/2017 1.02   Final  . ABO/RH(D) 07/08/2017 O NEG   Final  . Antibody Screen 07/08/2017 NEG   Final  . Sample Expiration 07/08/2017 07/16/2017   Final  . Extend sample reason 07/08/2017 NO TRANSFUSIONS OR PREGNANCY IN THE PAST 3 MONTHS   Final  Hospital Outpatient Visit on 06/03/2017  Component Date Value Ref Range Status  . Rest HR 06/03/2017 68  bpm Final  . Rest BP 06/03/2017 153/86  mmHg Final  . Exercise duration (sec) 06/03/2017 46  sec Final  . Percent HR 06/03/2017 90  % Final  . Exercise duration (min) 06/03/2017 5  min Final  . Estimated workload 06/03/2017 7.0  METS Final  . Peak HR 06/03/2017 137  BPM Final  . Peak BP 06/03/2017 200  mmHg Final  . MPHR 06/03/2017 153  bpm Final  . SSS 06/03/2017 8   Final  . SRS 06/03/2017 5   Final  . SDS 06/03/2017 4   Final  . TID 06/03/2017 0.86   Final  . LV sys vol 06/03/2017 66  mL Final  . LV dias vol 06/03/2017 124  62 - 150 mL Final  . Phase 1 name 06/03/2017 PRETEST   Final  . Stage 1 Name 06/03/2017 SUPINE   Final  . Stage 1 Time 06/03/2017 00:01:20   Final  . Stage 1 Speed 06/03/2017 0.0  mph Final  . Stage 1 Grade 06/03/2017 0.0  % Final  . Stage 1 HR 06/03/2017 80  bpm Final  . Phase 2 Name 06/03/2017 Exercise   Final  . Stage 2 Name 06/03/2017 STAGE 1   Final  . Stage 2 Attribute 06/03/2017 Baseline   Final  . Stage 2 Time 06/03/2017 00:00:01   Final  . Stage 2 Speed 06/03/2017 0.0  mph Final  . Stage 2 Grade 06/03/2017 0.0  % Final  . Stage 2 HR 06/03/2017 80  bpm Final  . Phase 3 Name 06/03/2017 Exercise   Final  . Stage 3 Name 06/03/2017 STAGE 1   Final  . Stage 3 Time 06/03/2017 00:03:00   Final  . Stage 3 Speed 06/03/2017 1.7  mph Final  . Stage 3 Grade 06/03/2017 10.0  % Final  . Stage 3 HR 06/03/2017 115  bpm Final  . Stage 3 SBP 06/03/2017 198  mmHg Final  . Stage 3 DBP 06/03/2017 94  mmHg Final  . Phase 4 Name 06/03/2017 Exercise   Final  . Stage 4 Name  06/03/2017 STAGE 2   Final  . Stage 4 Attribute 06/03/2017 Peak   Final  . Stage 4 Time 06/03/2017 00:02:47   Final  . Stage 4 Speed 06/03/2017 2.5  mph Final  . Stage 4 Grade 06/03/2017 12.0  % Final  . Stage 4 HR 06/03/2017 137  bpm Final  . Stage 4 SBP 06/03/2017 200  mmHg Final  . Stage 4 DBP 06/03/2017 94  mmHg Final  . Phase 5 Name 06/03/2017 Recovery   Final  . Stage 5 Attribute 06/03/2017 Recovery 82mn   Final  . Stage 5 Time 06/03/2017 00:00:59   Final  . Stage 5 Speed 06/03/2017 0.0  mph Final  . Stage 5 Grade 06/03/2017 0.0  % Final  . Stage 5 HR 06/03/2017 120  bpm Final  . Phase 6 Name 06/03/2017 Recovery   Final  . Stage 6 Time 06/03/2017 00:07:06   Final  . Stage 6 Speed 06/03/2017 0.0  mph Final  . Stage 6 Grade 06/03/2017 0.0  % Final  . Stage 6 HR 06/03/2017 86  bpm Final  . Stage 6 SBP 06/03/2017 160  mmHg Final  . Stage 6 DBP 06/03/2017 81  mmHg Final  . Percent of predicted max HR 06/03/2017 89  % Final  X-Rays:No results found.  EKG: Orders placed or performed in visit on 11/27/16  . EKG 12-Lead     Hospital Course: Joshua Roy is a 66 y.o. who was admitted to [facility]. They were brought to the operating room on 07/13/2017 and underwent Procedure(s): RIGHT TOTAL KNEE ARTHROPLASTY.  Patient tolerated the procedure well and was later transferred to the recovery room and then to the orthopaedic floor for postoperative care.  They were given PO and IV analgesics for pain control following their surgery.  They were given 24 hours of postoperative antibiotics of  Anti-infectives (From admission, onward)   Start     Dose/Rate Route Frequency Ordered Stop   07/13/17 1700  ceFAZolin (ANCEF) IVPB 2g/100 mL premix     2 g 200 mL/hr over 30 Minutes Intravenous Every 6 hours 07/13/17 1407 07/13/17 2248   07/13/17 0907  ceFAZolin (ANCEF) 2-4 GM/100ML-% IVPB    Comments:  Bridget Hartshorn   : cabinet override      07/13/17 0907 07/13/17 1055    07/13/17 0857  ceFAZolin (ANCEF) IVPB 2g/100 mL premix     2 g 200 mL/hr over 30 Minutes Intravenous On call to O.R. 07/13/17 0857 07/13/17 1110     and started on DVT prophylaxis in the form of Aspirin and Plavix.   PT and OT were ordered for total joint protocol.  Discharge planning consulted to help with postop disposition and equipment needs.  Patient had a good night on the evening of surgery.  They started to get up OOB with therapy on day one and walked well with therapy. Hemovac drain was pulled without difficulty.  Dressing was checked and was good. Patient was seen in rounds and was ready to go home later that afternoon on day one.   Discharge home - straight to outpatient therapy Diet - Cardiac diet and Diabetic diet Follow up - in 2 weeks Activity - WBAT Disposition - Home Condition Upon Discharge - improving D/C Meds - See DC Summary DVT Prophylaxis - Aspirin and Plavix 75 mg    Discharge Instructions    Call MD / Call 911   Complete by:  As directed    If you experience chest pain or shortness of breath, CALL 911 and be transported to the hospital emergency room.  If you develope a fever above 101 F, pus (white drainage) or increased drainage or redness at the wound, or calf pain, call your surgeon's office.   Change dressing   Complete by:  As directed    Change dressing daily with sterile 4 x 4 inch gauze dressing and apply TED hose. Do not submerge the incision under water.   Constipation Prevention   Complete by:  As directed    Drink plenty of fluids.  Prune juice may be helpful.  You may use a stool softener, such as Colace (over the counter) 100 mg twice a day.  Use MiraLax (over the counter) for constipation as needed.   Diet - low sodium heart healthy   Complete by:  As directed    Diet Carb Modified   Complete by:  As directed    Discharge instructions   Complete by:  As directed    Take a full dose 325 mg Aspirin daily for three more weeks. Once the patient  has completed the full dose Aspirin, then reduce to a Baby 81 mg Aspirin daily for three more weeks.  Resume the Plavix 75 mg daily at home following discharge.  Pick up stool softner and laxative for home use following surgery while on pain medications. Do not submerge incision under water. Please use good hand washing techniques while changing dressing each day. May shower starting three days after surgery. Please use a clean towel to pat the incision dry following showers. Continue to use ice for pain and swelling after surgery. Do not use any lotions or creams on the incision until instructed by your surgeon.  Wear both TED hose on both legs during the day every day for three weeks, but may remove the TED hose at night at home.  Postoperative Constipation Protocol  Constipation - defined medically as fewer than three stools per week and severe constipation as less than one stool per week.  One of the most common issues patients have following surgery is constipation.  Even if you have a regular bowel pattern at home, your normal regimen is likely to be disrupted due to multiple reasons following surgery.  Combination of anesthesia, postoperative narcotics, change in appetite and fluid intake all can affect your bowels.  In order to avoid complications following surgery, here are some recommendations in order to help you during your recovery period.  Colace (docusate) - Pick up an over-the-counter form of Colace or another stool softener and take twice a day as long as you are requiring postoperative pain medications.  Take with a full glass of water daily.  If you experience loose stools or diarrhea, hold the colace until you stool forms back up.  If your symptoms do not get better within 1 week or if they get worse, check with your doctor.  Dulcolax (bisacodyl) - Pick up over-the-counter and take as directed by the product packaging as needed to assist with the movement of your bowels.   Take with a full glass of water.  Use this product as needed if not relieved by Colace only.   MiraLax (polyethylene glycol) - Pick up over-the-counter to have on hand.  MiraLax is a solution that will increase the amount of water in your bowels to assist with bowel movements.  Take as directed and can mix with a glass of water, juice, soda, coffee, or tea.  Take if you go more than two days without a movement. Do not use MiraLax more than once per day. Call your doctor if you are still constipated or irregular after using this medication for 7 days in a row.  If you continue to have problems with postoperative constipation, please contact the office for further assistance and recommendations.  If you experience "the worst abdominal pain ever" or develop nausea or vomiting, please contact the office immediatly for further recommendations for treatment.   Do not put a pillow under the knee. Place it under the heel.   Complete by:  As directed    Do not sit on low chairs, stoools or toilet seats, as it may be difficult to get up from low surfaces   Complete by:  As directed    Driving restrictions   Complete by:  As directed    No driving until released by the physician.   Increase activity slowly as tolerated   Complete by:  As directed    Lifting restrictions   Complete by:  As directed    No lifting until released by the physician.   Patient may shower   Complete by:  As directed    You may shower without a dressing once there is no drainage.  Do not wash  over the wound.  If drainage remains, do not shower until drainage stops.   TED hose   Complete by:  As directed    Use stockings (TED hose) for 3 weeks on both leg(s).  You may remove them at night for sleeping.   Weight bearing as tolerated   Complete by:  As directed    Laterality:  right   Extremity:  Lower     Allergies as of 07/14/2017      Reactions   Mucinex D [pseudoephedrine-guaifenesin Er]    Elevated BP, insomnia    Ramipril    REACTION: Cough      Medication List    STOP taking these medications   Fish Oil 1000 MG Caps   rivaroxaban 10 MG Tabs tablet Commonly known as:  XARELTO   sildenafil 20 MG tablet Commonly known as:  REVATIO   sitaGLIPtin 100 MG tablet Commonly known as:  JANUVIA   ZITHROMAX Z-PAK 250 MG tablet Generic drug:  azithromycin     TAKE these medications   accu-chek multiclix lancets Use as instructed to check blood sugar once daily or as needed.  Diagnosis:  E11.9   Non insulin-dependent.   acetaminophen 500 MG tablet Commonly known as:  TYLENOL Take 1,000 mg by mouth every 8 (eight) hours as needed for mild pain or headache.   aspirin 325 MG EC tablet Take 1 tablet (325 mg total) by mouth daily. Take a full dose 325 mg Aspirin daily for three more weeks. Once the patient has completed the full dose Aspirin, then reduce to a Baby 81 mg Aspirin daily for three more weeks.   atorvastatin 80 MG tablet Commonly known as:  LIPITOR Take 1 tablet (80 mg total) by mouth daily.   clopidogrel 75 MG tablet Commonly known as:  PLAVIX Take 75 mg by mouth daily.   ezetimibe 10 MG tablet Commonly known as:  ZETIA Take 1 tablet (10 mg total) by mouth daily.   fenofibrate 160 MG tablet Take 1 tablet (160 mg total) by mouth daily.   gabapentin 300 MG capsule Commonly known as:  NEURONTIN Take 1 capsule (300 mg total) by mouth 3 (three) times daily. Gabapentin 300 mg Protocol Take a 300 mg capsule three times a day for one week, Then a 300 mg capsule twice a day for one week, Then a 300 mg capsule once a day for one week, then discontinue the Gabapentin.   glimepiride 2 MG tablet Commonly known as:  AMARYL Take one half tablet (1 mg) by mouth each morning What changed:    how much to take  how to take this  when to take this  additional instructions   glucose blood test strip Commonly known as:  ACCU-CHEK AVIVA PLUS Use to check blood sugar once daily or as  needed.  Diagnosis:  E11.9   Non insulin-dependent.   losartan 25 MG tablet Commonly known as:  COZAAR Take 1 tablet (25 mg total) by mouth daily.   metFORMIN 1000 MG tablet Commonly known as:  GLUCOPHAGE Take 1 tablet (1,000 mg total) by mouth 2 (two) times daily.   methocarbamol 500 MG tablet Commonly known as:  ROBAXIN Take 1 tablet (500 mg total) by mouth every 6 (six) hours as needed for muscle spasms.   metoprolol tartrate 25 MG tablet Commonly known as:  LOPRESSOR Take 1 tablet (25 mg total) by mouth 2 (two) times daily.   oxyCODONE 5 MG immediate release tablet Commonly known as:  Oxy  IR/ROXICODONE Take 1-2 tablets (5-10 mg total) by mouth every 4 (four) hours as needed for moderate pain or severe pain.   pantoprazole 40 MG tablet Commonly known as:  PROTONIX Take 1 tablet (40 mg total) by mouth daily.   traMADol 50 MG tablet Commonly known as:  ULTRAM Take 1-2 tablets (50-100 mg total) by mouth every 6 (six) hours as needed (mild pain).            Discharge Care Instructions  (From admission, onward)        Start     Ordered   07/14/17 0000  Weight bearing as tolerated    Question Answer Comment  Laterality right   Extremity Lower      07/14/17 0842   07/14/17 0000  Change dressing    Comments:  Change dressing daily with sterile 4 x 4 inch gauze dressing and apply TED hose. Do not submerge the incision under water.   07/14/17 6712     Follow-up Information    Gaynelle Arabian, MD. Schedule an appointment as soon as possible for a visit in 2 week(s).   Specialty:  Orthopedic Surgery Contact information: [address] [place] [address] [phone]           Signed: Arlee Muslim, PA-C Orthopaedic Surgery 07/14/2017, 8:43 AM

## 2017-07-14 NOTE — Progress Notes (Signed)
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   Follow Up Recommendations  DC plan and follow up therapy as arranged by surgeon;Outpatient PT     Equipment Recommendations  None recommended by PT    Recommendations for Other Services       Precautions / Restrictions Precautions Precautions: Knee Restrictions Weight Bearing Restrictions: No Other Position/Activity Restrictions: WBAT    Mobility  Bed Mobility               General bed mobility comments: OOB in recliner  Transfers Overall transfer level: Needs assistance Equipment used: Rolling walker (2 wheeled) Transfers: Sit to/from Stand Sit to Stand: Supervision;Min guard         General transfer comment: VCs hand placement  Ambulation/Gait Ambulation/Gait assistance: Supervision;Min guard Ambulation Distance (Feet): 75 Feet Assistive device: Rolling walker (2 wheeled) Gait Pattern/deviations: Step-to pattern;Antalgic Gait velocity: decreased   General Gait Details: steady with RW, no LOB, VCs sequencing   Stairs            Wheelchair Mobility    Modified Rankin (Stroke Patients Only)       Balance                                            Cognition Arousal/Alertness: Awake/alert Behavior During Therapy: WFL for tasks assessed/performed Overall Cognitive Status: Within Functional Limits for tasks assessed                                        Exercises   Total Knee Replacement TE's 10 reps B LE ankle pumps 10 reps towel squeezes 10 reps knee presses 10 reps heel slides  10 reps SAQ's 10 reps SLR's 10 reps ABD Followed by ICE     General Comments         Pertinent Vitals/Pain Pain Assessment: 0-10 Pain Score: 4  Pain Location: R knee Pain Descriptors / Indicators: Sore;Grimacing;Operative site guarding Pain Intervention(s): Monitored during session;Repositioned;Premedicated before session;Ice applied    Home Living                      Prior Function            PT Goals (current goals can now be found in the care plan section) Progress towards PT goals: Progressing toward goals    Frequency    7X/week      PT Plan Current plan remains appropriate    Co-evaluation              AM-PAC PT "6 Clicks" Daily Activity  Outcome Measure  Difficulty turning over in bed (including adjusting bedclothes, sheets and blankets)?: Unable Difficulty moving from lying on back to sitting on the side of the bed? : Unable Difficulty sitting down on and standing up from a chair with arms (e.g., wheelchair, bedside commode, etc,.)?: Unable Help needed moving to and from a bed to chair (including a wheelchair)?: Total Help needed walking in hospital room?: Total Help  needed climbing 3-5 steps with a railing? : Total 6 Click Score: 6    End of Session Equipment Utilized During Treatment: Gait belt;Right knee immobilizer   Patient left: in chair;with call bell/phone within reach;with chair alarm set;with family/visitor present Nurse Communication: Mobility status PT Visit Diagnosis: Difficulty in walking, not elsewhere classified (R26.2);Muscle weakness (generalized) (M62.81);Pain Pain - Right/Left: Right Pain - part of body: Knee     Time: 8182-9937 PT Time Calculation (min) (ACUTE ONLY): 27 min  Charges:  $Gait Training: 8-22 mins $Therapeutic Exercise: 8-22 mins                    G Codes:       Rica Koyanagi  PTA [facility]  Acute  Rehab Pager      [phone]

## 2017-07-14 NOTE — Progress Notes (Signed)
   Subjective: 1 Day Post-Op Procedure(s) (LRB): RIGHT TOTAL KNEE ARTHROPLASTY (Right) Patient reports pain as mild.   Patient seen in rounds for Dr. Wynelle Link. Patient is well, but has had some minor complaints of pain in the knee, requiring pain medications We will start therapy today.  If they do well with therapy and meets all goals, then will allow home later this afternoon following therapy. Plan is to go Home after hospital stay.  Objective: Vital signs in last 24 hours: Temp:  [97 F (36.1 C)-98.8 F (37.1 C)] 98.3 F (36.8 C) (12/18 0621) Pulse Rate:  [55-86] 71 (12/18 0621) Resp:  [12-23] 16 (12/18 0621) BP: (83-208)/(60-126) 139/60 (12/18 0621) SpO2:  [90 %-99 %] 93 % (12/18 0621) Weight:  [111.6 kg (246 lb)] 111.6 kg (246 lb) (12/17 0933)  Intake/Output from previous day:  Intake/Output Summary (Last 24 hours) at 07/14/2017 0833 Last data filed at 07/14/2017 0620 Gross per 24 hour  Intake 3605.22 ml  Output 3605 ml  Net 0.22 ml    Intake/Output this shift: No intake/output data recorded.  Labs: Recent Labs    07/14/17 0504  HGB 14.1   Recent Labs    07/14/17 0504  WBC 18.3*  RBC 5.29  HCT 43.7  PLT 281   Recent Labs    07/14/17 0504  NA 135  K 4.6  CL 101  CO2 27  BUN 22*  CREATININE 1.19  GLUCOSE 250*  CALCIUM 8.9   No results for input(s): LABPT, INR in the last 72 hours.  EXAM General - Patient is Alert, Appropriate and Oriented Extremity - Neurovascular intact Sensation intact distally Intact pulses distally Dressing - dressing C/D/I Motor Function - intact, moving foot and toes well on exam.  Hemovac pulled without difficulty.  Past Medical History:  Diagnosis Date  . ACE-inhibitor cough   . Anxiety   . Bronchitis 06/27/2016  . COPD (chronic obstructive pulmonary disease) (HCC)    cath, mild inf. hypokinesis EF 49%, 100% ROA  . Coronary artery disease 2001  . Coronary atherosclerosis   . Diabetes mellitus type II   .  Hyperlipidemia   . Hypertension   . MI (myocardial infarction) ([place]) age 43  . OA (osteoarthritis)    knee OA, injected 2012 by ortho  . Pneumonia     Assessment/Plan: 1 Day Post-Op Procedure(s) (LRB): RIGHT TOTAL KNEE ARTHROPLASTY (Right) Active Problems:   OA (osteoarthritis) of knee  Estimated body mass index is 35.3 kg/m as calculated from the following:   Height as of this encounter: 5\' 10"  (1.778 m).   Weight as of this encounter: 111.6 kg (246 lb). Advance diet Up with therapy  DVT Prophylaxis - Aspirin and Plavix Weight-Bearing as tolerated to right leg D/C O2 and Pulse OX and try on Room Air  If meets goals and able to go home: Discharge home - straight to outpatient therapy Diet - Cardiac diet and Diabetic diet Follow up - in 2 weeks Activity - WBAT Disposition - Home Condition Upon Discharge - pending therapy D/C Meds - See DC Summary DVT Prophylaxis - Aspirin and Plavix 75 mg  Arlee Muslim, PA-C Orthopaedic Surgery

## 2017-07-14 NOTE — Progress Notes (Signed)
OT Cancellation Note  Patient Details Name: Joshua Roy MRN: [id number] DOB: [birth year]   Cancelled Treatment:    Reason Eval/Treat Not Completed: OT screened, no needs identified, will sign off  Shiniqua Groseclose 07/14/2017, 10:36 AM  Lesle Chris, OTR/L [phone] 07/14/2017

## 2017-07-14 NOTE — Progress Notes (Signed)
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  Pt ready to D/C to home.   Follow Up Recommendations  DC plan and follow up therapy as arranged by surgeon;Outpatient PT     Equipment Recommendations  None recommended by PT    Recommendations for Other Services       Precautions / Restrictions Precautions Precautions: Knee Restrictions Weight Bearing Restrictions: No Other Position/Activity Restrictions: WBAT    Mobility  Bed Mobility               General bed mobility comments: OOB in recliner  Transfers Overall transfer level: Needs assistance Equipment used: Rolling walker (2 wheeled) Transfers: Sit to/from Stand Sit to Stand: Supervision;Min guard         General transfer comment: VCs hand placement  Ambulation/Gait Ambulation/Gait assistance: Supervision;Min guard Ambulation Distance (Feet): 110 Feet Assistive device: Rolling walker (2 wheeled) Gait Pattern/deviations: Step-to pattern;Antalgic Gait velocity: decreased   General Gait Details: steady with RW, no LOB, VCs sequencing   Stairs Stairs: Yes   Stair Management: Two rails;Forwards Number of Stairs: 4 General stair comments: <25% VC's on proper sequencing/safety  Wheelchair Mobility    Modified Rankin (Stroke Patients Only)       Balance                                            Cognition Arousal/Alertness: Awake/alert Behavior During Therapy: WFL for tasks assessed/performed Overall Cognitive Status: Within Functional Limits for tasks assessed                                        Exercises      General Comments        Pertinent Vitals/Pain Pain Assessment: 0-10 Pain Score: 4  Pain Location: R knee Pain  Descriptors / Indicators: Sore;Grimacing;Operative site guarding Pain Intervention(s): Monitored during session;Repositioned;Premedicated before session;Ice applied    Home Living                      Prior Function            PT Goals (current goals can now be found in the care plan section) Progress towards PT goals: Progressing toward goals    Frequency    7X/week      PT Plan Current plan remains appropriate    Co-evaluation              AM-PAC PT "6 Clicks" Daily Activity  Outcome Measure  Difficulty turning over in bed (including adjusting bedclothes, sheets and blankets)?: Unable Difficulty moving from lying on back to sitting on the side of the bed? : Unable Difficulty sitting down on and standing up from a chair with arms (e.g., wheelchair, bedside commode, etc,.)?: Unable Help needed moving to and from a bed to chair (including a wheelchair)?: Total Help needed walking in hospital room?: Total Help needed climbing 3-5 steps with a railing? : Total 6 Click Score: 6    End of Session Equipment Utilized During Treatment: Gait belt;Right knee immobilizer   Patient  left: in chair;with call bell/phone within reach;with chair alarm set;with family/visitor present Nurse Communication: Mobility status PT Visit Diagnosis: Difficulty in walking, not elsewhere classified (R26.2);Muscle weakness (generalized) (M62.81);Pain Pain - Right/Left: Right Pain - part of body: Knee     Time: 1200-1215 PT Time Calculation (min) (ACUTE ONLY): 15 min  Charges:  $Gait Training: 8-22 mins                    G Codes:       {Maya Arcand  PTA [facility]  Acute  Rehab Pager      [phone]

## 2017-07-14 NOTE — Discharge Instructions (Signed)
° °Dr. Frank Aluisio °Total Joint Specialist °[facility] °[address] °[place], [place] [address] °[phone] ° °TOTAL KNEE REPLACEMENT POSTOPERATIVE DIRECTIONS ° °Knee Rehabilitation, Guidelines Following Surgery  °Results after knee surgery are often greatly improved when you follow the exercise, range of motion and muscle strengthening exercises prescribed by your doctor. Safety measures are also important to protect the knee from further injury. Any time any of these exercises cause you to have increased pain or swelling in your knee joint, decrease the amount until you are comfortable again and slowly increase them. If you have problems or questions, call your caregiver or physical therapist for advice.  ° °HOME CARE INSTRUCTIONS  °Remove items at home which could result in a fall. This includes throw rugs or furniture in walking pathways.  °· ICE to the affected knee every three hours for 30 minutes at a time and then as needed for pain and swelling.  Continue to use ice on the knee for pain and swelling from surgery. You may notice swelling that will progress down to the foot and ankle.  This is normal after surgery.  Elevate the leg when you are not up walking on it.   °· Continue to use the breathing machine which will help keep your temperature down.  It is common for your temperature to cycle up and down following surgery, especially at night when you are not up moving around and exerting yourself.  The breathing machine keeps your lungs expanded and your temperature down. °· Do not place pillow under knee, focus on keeping the knee straight while resting ° °DIET °You may resume your previous home diet once your are discharged from the hospital. ° °DRESSING / WOUND CARE / SHOWERING °You may shower 3 days after surgery, but keep the wounds dry during showering.  You may use an occlusive plastic wrap (Press'n Seal for example), NO SOAKING/SUBMERGING IN THE BATHTUB.  If the  bandage gets wet, change with a clean dry gauze.  If the incision gets wet, pat the wound dry with a clean towel. °You may start showering once you are discharged home but do not submerge the incision under water. Just pat the incision dry and apply a dry gauze dressing on daily. °Change the surgical dressing daily and reapply a dry dressing each time. ° °ACTIVITY °Walk with your walker as instructed. °Use walker as long as suggested by your caregivers. °Avoid periods of inactivity such as sitting longer than an hour when not asleep. This helps prevent blood clots.  °You may resume a sexual relationship in one month or when given the OK by your doctor.  °You may return to work once you are cleared by your doctor.  °Do not drive a car for 6 weeks or until released by you surgeon.  °Do not drive while taking narcotics. ° °WEIGHT BEARING °Weight bearing as tolerated with assist device (walker, cane, etc) as directed, use it as long as suggested by your surgeon or therapist, typically at least 4-6 weeks. ° °POSTOPERATIVE CONSTIPATION PROTOCOL °Constipation - defined medically as fewer than three stools per week and severe constipation as less than one stool per week. ° °One of the most common issues patients have following surgery is constipation.  Even if you have a regular bowel pattern at home, your normal regimen is likely to be disrupted due to multiple reasons following surgery.  Combination of anesthesia, postoperative narcotics, change in appetite and fluid intake all can affect your bowels.    In order to avoid complications following surgery, here are some recommendations in order to help you during your recovery period. ° °Colace (docusate) - Pick up an over-the-counter form of Colace or another stool softener and take twice a day as long as you are requiring postoperative pain medications.  Take with a full glass of water daily.  If you experience loose stools or diarrhea, hold the colace until you stool forms  back up.  If your symptoms do not get better within 1 week or if they get worse, check with your doctor. ° °Dulcolax (bisacodyl) - Pick up over-the-counter and take as directed by the product packaging as needed to assist with the movement of your bowels.  Take with a full glass of water.  Use this product as needed if not relieved by Colace only.  ° °MiraLax (polyethylene glycol) - Pick up over-the-counter to have on hand.  MiraLax is a solution that will increase the amount of water in your bowels to assist with bowel movements.  Take as directed and can mix with a glass of water, juice, soda, coffee, or tea.  Take if you go more than two days without a movement. °Do not use MiraLax more than once per day. Call your doctor if you are still constipated or irregular after using this medication for 7 days in a row. ° °If you continue to have problems with postoperative constipation, please contact the office for further assistance and recommendations.  If you experience "the worst abdominal pain ever" or develop nausea or vomiting, please contact the office immediatly for further recommendations for treatment. ° °ITCHING ° If you experience itching with your medications, try taking only a single pain pill, or even half a pain pill at a time.  You can also use Benadryl over the counter for itching or also to help with sleep.  ° °TED HOSE STOCKINGS °Wear the elastic stockings on both legs for three weeks following surgery during the day but you may remove then at night for sleeping. ° °MEDICATIONS °See your medication summary on the “After Visit Summary” that the nursing staff will review with you prior to discharge.  You may have some home medications which will be placed on hold until you complete the course of blood thinner medication.  It is important for you to complete the blood thinner medication as prescribed by your surgeon.  Continue your approved medications as instructed at time of  discharge. ° °PRECAUTIONS °If you experience chest pain or shortness of breath - call 911 immediately for transfer to the hospital emergency department.  °If you develop a fever greater that 101 F, purulent drainage from wound, increased redness or drainage from wound, foul odor from the wound/dressing, or calf pain - CONTACT YOUR SURGEON.   °                                                °FOLLOW-UP APPOINTMENTS °Make sure you keep all of your appointments after your operation with your surgeon and caregivers. You should call the office at the above phone number and make an appointment for approximately two weeks after the date of your surgery or on the date instructed by your surgeon outlined in the "After Visit Summary". ° ° °RANGE OF MOTION AND STRENGTHENING EXERCISES  °Rehabilitation of the knee is important following a knee injury or   an operation. After just a few days of immobilization, the muscles of the thigh which control the knee become weakened and shrink (atrophy). Knee exercises are designed to build up the tone and strength of the thigh muscles and to improve knee motion. Often times heat used for twenty to thirty minutes before working out will loosen up your tissues and help with improving the range of motion but do not use heat for the first two weeks following surgery. These exercises can be done on a training (exercise) mat, on the floor, on a table or on a bed. Use what ever works the best and is most comfortable for you Knee exercises include:  Leg Lifts - While your knee is still immobilized in a splint or cast, you can do straight leg raises. Lift the leg to 60 degrees, hold for 3 sec, and slowly lower the leg. Repeat 10-20 times 2-3 times daily. Perform this exercise against resistance later as your knee gets better.  Quad and Hamstring Sets - Tighten up the muscle on the front of the thigh (Quad) and hold for 5-10 sec. Repeat this 10-20 times hourly. Hamstring sets are done by pushing the  foot backward against an object and holding for 5-10 sec. Repeat as with quad sets.   Leg Slides: Lying on your back, slowly slide your foot toward your buttocks, bending your knee up off the floor (only go as far as is comfortable). Then slowly slide your foot back down until your leg is flat on the floor again.  Angel Wings: Lying on your back spread your legs to the side as far apart as you can without causing discomfort.  A rehabilitation program following serious knee injuries can speed recovery and prevent re-injury in the future due to weakened muscles. Contact your doctor or a physical therapist for more information on knee rehabilitation.   IF YOU ARE TRANSFERRED TO A SKILLED REHAB FACILITY If the patient is transferred to a skilled rehab facility following release from the hospital, a list of the current medications will be sent to the facility for the patient to continue.  When discharged from the skilled rehab facility, please have the facility set up the patient's [place] prior to being released. Also, the skilled facility will be responsible for providing the patient with their medications at time of release from the facility to include their pain medication, the muscle relaxants, and their blood thinner medication. If the patient is still at the rehab facility at time of the two week follow up appointment, the skilled rehab facility will also need to assist the patient in arranging follow up appointment in our office and any transportation needs.  MAKE SURE YOU:  Understand these instructions.  Get help right away if you are not doing well or get worse.    Pick up stool softner and laxative for home use following surgery while on pain medications. Do not submerge incision under water. Please use good hand washing techniques while changing dressing each day. May shower starting three days after surgery. Please use a clean towel to pat the incision dry following  showers. Continue to use ice for pain and swelling after surgery. Do not use any lotions or creams on the incision until instructed by your surgeon.  Take a full dose 325 mg Aspirin daily for three more weeks. Once the patient has completed the full dose Aspirin, then reduce to a Baby 81 mg Aspirin daily for three more weeks.  Resume  the Plavix 75 mg daily at home following discharge.

## 2017-07-17 DIAGNOSIS — M25561 Pain in right knee: Secondary | ICD-10-CM | POA: Diagnosis not present

## 2017-07-17 DIAGNOSIS — Z96651 Presence of right artificial knee joint: Secondary | ICD-10-CM | POA: Diagnosis not present

## 2017-07-23 DIAGNOSIS — Z96651 Presence of right artificial knee joint: Secondary | ICD-10-CM | POA: Diagnosis not present

## 2017-07-23 DIAGNOSIS — M25561 Pain in right knee: Secondary | ICD-10-CM | POA: Diagnosis not present

## 2017-07-28 NOTE — Progress Notes (Signed)
   07/13/17 1657  Acute Rehab PT Goals  Patient Stated Goal to be able to walk  PT Goal Formulation With patient/family  Time For Goal Achievement 07/20/17  Potential to Achieve Goals Good  PT Time Calculation  PT Start Time (ACUTE ONLY) 1558  PT Stop Time (ACUTE ONLY) 1627  PT Time Calculation (min) (ACUTE ONLY) 29 min  PT G-Codes **NOT FOR INPATIENT CLASS**  Functional Assessment Tool Used AM-PAC 6 Clicks Basic Mobility  Functional Limitation Mobility: Walking and moving around  Mobility: Walking and Moving Around Current Status (D8978) CK  Mobility: Walking and Moving Around Goal Status (E7841) CI  PT General Charges  $$ ACUTE PT VISIT 1 Visit  PT Evaluation  $PT Eval Low Complexity 1 Low  PT Treatments  $Gait Training 8-22 mins  [phone]

## 2017-07-30 DIAGNOSIS — Z96651 Presence of right artificial knee joint: Secondary | ICD-10-CM | POA: Diagnosis not present

## 2017-07-30 DIAGNOSIS — M25561 Pain in right knee: Secondary | ICD-10-CM | POA: Diagnosis not present

## 2017-08-04 DIAGNOSIS — Z96651 Presence of right artificial knee joint: Secondary | ICD-10-CM | POA: Diagnosis not present

## 2017-08-04 DIAGNOSIS — M25561 Pain in right knee: Secondary | ICD-10-CM | POA: Diagnosis not present

## 2017-08-06 DIAGNOSIS — M25561 Pain in right knee: Secondary | ICD-10-CM | POA: Diagnosis not present

## 2017-08-06 DIAGNOSIS — Z96651 Presence of right artificial knee joint: Secondary | ICD-10-CM | POA: Diagnosis not present

## 2017-08-11 DIAGNOSIS — M25561 Pain in right knee: Secondary | ICD-10-CM | POA: Diagnosis not present

## 2017-08-11 DIAGNOSIS — Z96651 Presence of right artificial knee joint: Secondary | ICD-10-CM | POA: Diagnosis not present

## 2017-08-13 DIAGNOSIS — Z96651 Presence of right artificial knee joint: Secondary | ICD-10-CM | POA: Diagnosis not present

## 2017-08-13 DIAGNOSIS — M25561 Pain in right knee: Secondary | ICD-10-CM | POA: Diagnosis not present

## 2017-08-17 DIAGNOSIS — Z96651 Presence of right artificial knee joint: Secondary | ICD-10-CM | POA: Diagnosis not present

## 2017-08-17 DIAGNOSIS — M25561 Pain in right knee: Secondary | ICD-10-CM | POA: Diagnosis not present

## 2017-08-18 DIAGNOSIS — Z471 Aftercare following joint replacement surgery: Secondary | ICD-10-CM | POA: Diagnosis not present

## 2017-08-18 DIAGNOSIS — Z96651 Presence of right artificial knee joint: Secondary | ICD-10-CM | POA: Diagnosis not present

## 2017-08-20 DIAGNOSIS — Z96651 Presence of right artificial knee joint: Secondary | ICD-10-CM | POA: Diagnosis not present

## 2017-08-20 DIAGNOSIS — M25561 Pain in right knee: Secondary | ICD-10-CM | POA: Diagnosis not present

## 2017-08-24 DIAGNOSIS — Z96651 Presence of right artificial knee joint: Secondary | ICD-10-CM | POA: Diagnosis not present

## 2017-08-24 DIAGNOSIS — M25561 Pain in right knee: Secondary | ICD-10-CM | POA: Diagnosis not present

## 2017-08-27 DIAGNOSIS — Z23 Encounter for immunization: Secondary | ICD-10-CM | POA: Diagnosis not present

## 2017-09-02 DIAGNOSIS — E119 Type 2 diabetes mellitus without complications: Secondary | ICD-10-CM | POA: Diagnosis not present

## 2017-09-03 DIAGNOSIS — M25561 Pain in right knee: Secondary | ICD-10-CM | POA: Diagnosis not present

## 2017-09-03 DIAGNOSIS — Z96651 Presence of right artificial knee joint: Secondary | ICD-10-CM | POA: Diagnosis not present

## 2017-09-07 DIAGNOSIS — Z96651 Presence of right artificial knee joint: Secondary | ICD-10-CM | POA: Diagnosis not present

## 2017-09-07 DIAGNOSIS — M25561 Pain in right knee: Secondary | ICD-10-CM | POA: Diagnosis not present

## 2017-09-08 DIAGNOSIS — Z96653 Presence of artificial knee joint, bilateral: Secondary | ICD-10-CM | POA: Diagnosis not present

## 2017-09-08 DIAGNOSIS — I10 Essential (primary) hypertension: Secondary | ICD-10-CM | POA: Diagnosis not present

## 2017-09-08 DIAGNOSIS — E119 Type 2 diabetes mellitus without complications: Secondary | ICD-10-CM | POA: Diagnosis not present

## 2017-09-08 DIAGNOSIS — I2510 Atherosclerotic heart disease of native coronary artery without angina pectoris: Secondary | ICD-10-CM | POA: Diagnosis not present

## 2017-09-10 DIAGNOSIS — M25561 Pain in right knee: Secondary | ICD-10-CM | POA: Diagnosis not present

## 2017-09-10 DIAGNOSIS — Z96651 Presence of right artificial knee joint: Secondary | ICD-10-CM | POA: Diagnosis not present

## 2017-09-14 DIAGNOSIS — M25561 Pain in right knee: Secondary | ICD-10-CM | POA: Diagnosis not present

## 2017-09-14 DIAGNOSIS — Z96651 Presence of right artificial knee joint: Secondary | ICD-10-CM | POA: Diagnosis not present

## 2017-09-17 DIAGNOSIS — Z96651 Presence of right artificial knee joint: Secondary | ICD-10-CM | POA: Diagnosis not present

## 2017-09-17 DIAGNOSIS — M25561 Pain in right knee: Secondary | ICD-10-CM | POA: Diagnosis not present

## 2017-09-21 DIAGNOSIS — M25561 Pain in right knee: Secondary | ICD-10-CM | POA: Diagnosis not present

## 2017-09-21 DIAGNOSIS — Z96651 Presence of right artificial knee joint: Secondary | ICD-10-CM | POA: Diagnosis not present

## 2017-09-24 DIAGNOSIS — M25561 Pain in right knee: Secondary | ICD-10-CM | POA: Diagnosis not present

## 2017-09-24 DIAGNOSIS — Z96651 Presence of right artificial knee joint: Secondary | ICD-10-CM | POA: Diagnosis not present

## 2017-09-30 DIAGNOSIS — M25561 Pain in right knee: Secondary | ICD-10-CM | POA: Diagnosis not present

## 2017-09-30 DIAGNOSIS — Z96651 Presence of right artificial knee joint: Secondary | ICD-10-CM | POA: Diagnosis not present

## 2017-10-05 DIAGNOSIS — M7541 Impingement syndrome of right shoulder: Secondary | ICD-10-CM | POA: Diagnosis not present

## 2018-01-11 DIAGNOSIS — E119 Type 2 diabetes mellitus without complications: Secondary | ICD-10-CM | POA: Diagnosis not present

## 2018-01-15 DIAGNOSIS — E119 Type 2 diabetes mellitus without complications: Secondary | ICD-10-CM | POA: Diagnosis not present

## 2018-01-15 DIAGNOSIS — I10 Essential (primary) hypertension: Secondary | ICD-10-CM | POA: Diagnosis not present

## 2018-01-15 DIAGNOSIS — I2510 Atherosclerotic heart disease of native coronary artery without angina pectoris: Secondary | ICD-10-CM | POA: Diagnosis not present

## 2018-01-15 DIAGNOSIS — I519 Heart disease, unspecified: Secondary | ICD-10-CM | POA: Diagnosis not present

## 2018-03-10 DIAGNOSIS — E119 Type 2 diabetes mellitus without complications: Secondary | ICD-10-CM | POA: Diagnosis not present

## 2018-03-10 DIAGNOSIS — Z0000 Encounter for general adult medical examination without abnormal findings: Secondary | ICD-10-CM | POA: Diagnosis not present

## 2018-03-10 DIAGNOSIS — I2510 Atherosclerotic heart disease of native coronary artery without angina pectoris: Secondary | ICD-10-CM | POA: Diagnosis not present

## 2018-03-10 DIAGNOSIS — I519 Heart disease, unspecified: Secondary | ICD-10-CM | POA: Diagnosis not present

## 2018-03-10 DIAGNOSIS — I10 Essential (primary) hypertension: Secondary | ICD-10-CM | POA: Diagnosis not present

## 2018-03-11 ENCOUNTER — Telehealth: Payer: Self-pay | Admitting: Gastroenterology

## 2018-03-11 NOTE — Telephone Encounter (Signed)
Gastroenterology Pre-Procedure Review  Request Date: 04/20/18   [facility] Requesting Physician: Dr. Allen Norris  PATIENT REVIEW QUESTIONS: The patient responded to the following health history questions as indicated:    1. Are you having any GI issues? no 2. Do you have a personal history of Polyps? no 3. Do you have a family history of Colon Cancer or Polyps? no 4. Diabetes Mellitus? yes (Type II) 5. Joint replacements in the past 12 months?yes (Both kneew 06/2017 & 06/2016) 6. Major health problems in the past 3 months?no 7. Any artificial heart valves, MVP, or defibrillator?no    MEDICATIONS & ALLERGIES:    Patient reports the following regarding taking any anticoagulation/antiplatelet therapy:   Plavix, Coumadin, Eliquis, Xarelto, Lovenox, Pradaxa, Brilinta, or Effient? yes (Plavix  Cardiologist   Dr. Rockey Situ) Aspirin? no  Patient confirms/reports the following medications:  Current Outpatient Medications  Medication Sig Dispense Refill  . acetaminophen (TYLENOL) 500 MG tablet Take 1,000 mg by mouth every 8 (eight) hours as needed for mild pain or headache.    Marland Kitchen aspirin EC 325 MG EC tablet Take 1 tablet (325 mg total) by mouth daily. Take a full dose 325 mg Aspirin daily for three more weeks. Once the patient has completed the full dose Aspirin, then reduce to a Baby 81 mg Aspirin daily for three more weeks. 21 tablet 0  . atorvastatin (LIPITOR) 80 MG tablet Take 1 tablet (80 mg total) by mouth daily. 90 tablet 3  . clopidogrel (PLAVIX) 75 MG tablet Take 75 mg by mouth daily.    Marland Kitchen ezetimibe (ZETIA) 10 MG tablet Take 1 tablet (10 mg total) by mouth daily. (Patient not taking: Reported on 07/02/2017) 90 tablet 3  . fenofibrate 160 MG tablet Take 1 tablet (160 mg total) by mouth daily. 90 tablet 1  . gabapentin (NEURONTIN) 300 MG capsule Take 1 capsule (300 mg total) by mouth 3 (three) times daily. Gabapentin 300 mg Protocol Take a 300 mg capsule three times a day for one week, Then a 300 mg  capsule twice a day for one week, Then a 300 mg capsule once a day for one week, then discontinue the Gabapentin. 42 capsule 0  . glimepiride (AMARYL) 2 MG tablet Take one half tablet (1 mg) by mouth each morning (Patient taking differently: Take 1 mg by mouth daily with breakfast. ) 45 tablet 1  . glucose blood (ACCU-CHEK AVIVA PLUS) test strip Use to check blood sugar once daily or as needed.  Diagnosis:  E11.9   Non insulin-dependent. 100 each 3  . Lancets (ACCU-CHEK MULTICLIX) lancets Use as instructed to check blood sugar once daily or as needed.  Diagnosis:  E11.9   Non insulin-dependent. 102 each 3  . losartan (COZAAR) 25 MG tablet Take 1 tablet (25 mg total) by mouth daily. 90 tablet 1  . metFORMIN (GLUCOPHAGE) 1000 MG tablet Take 1 tablet (1,000 mg total) by mouth 2 (two) times daily. 180 tablet 1  . methocarbamol (ROBAXIN) 500 MG tablet Take 1 tablet (500 mg total) by mouth every 6 (six) hours as needed for muscle spasms. 80 tablet 0  . metoprolol tartrate (LOPRESSOR) 25 MG tablet Take 1 tablet (25 mg total) by mouth 2 (two) times daily. 180 tablet 1  . oxyCODONE (OXY IR/ROXICODONE) 5 MG immediate release tablet Take 1-2 tablets (5-10 mg total) by mouth every 4 (four) hours as needed for moderate pain or severe pain. 30 tablet 0  . pantoprazole (PROTONIX) 40 MG tablet Take 1 tablet (40  mg total) by mouth daily. 90 tablet 1  . traMADol (ULTRAM) 50 MG tablet Take 1-2 tablets (50-100 mg total) by mouth every 6 (six) hours as needed (mild pain). 56 tablet 1   No current facility-administered medications for this visit.     Patient confirms/reports the following allergies:  Allergies  Allergen Reactions  . Mucinex D [Pseudoephedrine-Guaifenesin Er]     Elevated BP, insomnia  . Ramipril     REACTION: Cough    No orders of the defined types were placed in this encounter.   AUTHORIZATION INFORMATION Primary Insurance: 1D#: Group #:  Secondary Insurance: 1D#: Group #:  SCHEDULE  INFORMATION: Date: 04/20/18   Allen Norris Time: Location:  [facility]

## 2018-03-12 ENCOUNTER — Other Ambulatory Visit: Payer: Self-pay

## 2018-03-12 DIAGNOSIS — I10 Essential (primary) hypertension: Secondary | ICD-10-CM | POA: Diagnosis not present

## 2018-03-12 DIAGNOSIS — E7849 Other hyperlipidemia: Secondary | ICD-10-CM | POA: Diagnosis not present

## 2018-03-12 DIAGNOSIS — C61 Malignant neoplasm of prostate: Secondary | ICD-10-CM | POA: Diagnosis not present

## 2018-03-12 DIAGNOSIS — R5381 Other malaise: Secondary | ICD-10-CM | POA: Diagnosis not present

## 2018-03-12 DIAGNOSIS — E119 Type 2 diabetes mellitus without complications: Secondary | ICD-10-CM | POA: Diagnosis not present

## 2018-03-12 DIAGNOSIS — Z1211 Encounter for screening for malignant neoplasm of colon: Secondary | ICD-10-CM

## 2018-03-24 DIAGNOSIS — D2271 Melanocytic nevi of right lower limb, including hip: Secondary | ICD-10-CM | POA: Diagnosis not present

## 2018-03-24 DIAGNOSIS — L821 Other seborrheic keratosis: Secondary | ICD-10-CM | POA: Diagnosis not present

## 2018-03-24 DIAGNOSIS — D2272 Melanocytic nevi of left lower limb, including hip: Secondary | ICD-10-CM | POA: Diagnosis not present

## 2018-03-24 DIAGNOSIS — D225 Melanocytic nevi of trunk: Secondary | ICD-10-CM | POA: Diagnosis not present

## 2018-03-24 DIAGNOSIS — R600 Localized edema: Secondary | ICD-10-CM | POA: Diagnosis not present

## 2018-03-24 DIAGNOSIS — D2261 Melanocytic nevi of right upper limb, including shoulder: Secondary | ICD-10-CM | POA: Diagnosis not present

## 2018-03-24 DIAGNOSIS — D2262 Melanocytic nevi of left upper limb, including shoulder: Secondary | ICD-10-CM | POA: Diagnosis not present

## 2018-03-30 DIAGNOSIS — I10 Essential (primary) hypertension: Secondary | ICD-10-CM | POA: Diagnosis not present

## 2018-03-30 DIAGNOSIS — E119 Type 2 diabetes mellitus without complications: Secondary | ICD-10-CM | POA: Diagnosis not present

## 2018-03-30 DIAGNOSIS — I2510 Atherosclerotic heart disease of native coronary artery without angina pectoris: Secondary | ICD-10-CM | POA: Diagnosis not present

## 2018-03-30 DIAGNOSIS — E1142 Type 2 diabetes mellitus with diabetic polyneuropathy: Secondary | ICD-10-CM | POA: Diagnosis not present

## 2018-04-20 ENCOUNTER — Ambulatory Visit
Admission: RE | Admit: 2018-04-20 | Discharge: 2018-04-20 | Disposition: A | Discharge status: Home / Self Care | Payer: Medicare Other | Source: Ambulatory Visit | Attending: Gastroenterology | Admitting: Gastroenterology

## 2018-04-20 ENCOUNTER — Ambulatory Visit: Payer: Medicare Other | Admitting: Anesthesiology

## 2018-04-20 ENCOUNTER — Encounter
Admission: RE | Disposition: A | Discharge status: Home / Self Care | Payer: Self-pay | Source: Ambulatory Visit | Attending: Gastroenterology

## 2018-04-20 ENCOUNTER — Encounter: Payer: Self-pay | Admitting: Anesthesiology

## 2018-04-20 DIAGNOSIS — Z888 Allergy status to other drugs, medicaments and biological substances status: Secondary | ICD-10-CM | POA: Diagnosis not present

## 2018-04-20 DIAGNOSIS — Z860100 Personal history of colon polyps, unspecified: Secondary | ICD-10-CM

## 2018-04-20 DIAGNOSIS — J449 Chronic obstructive pulmonary disease, unspecified: Secondary | ICD-10-CM | POA: Insufficient documentation

## 2018-04-20 DIAGNOSIS — I252 Old myocardial infarction: Secondary | ICD-10-CM | POA: Insufficient documentation

## 2018-04-20 DIAGNOSIS — D122 Benign neoplasm of ascending colon: Secondary | ICD-10-CM | POA: Diagnosis not present

## 2018-04-20 DIAGNOSIS — E785 Hyperlipidemia, unspecified: Secondary | ICD-10-CM | POA: Insufficient documentation

## 2018-04-20 DIAGNOSIS — Z7982 Long term (current) use of aspirin: Secondary | ICD-10-CM | POA: Diagnosis not present

## 2018-04-20 DIAGNOSIS — Z79899 Other long term (current) drug therapy: Secondary | ICD-10-CM | POA: Diagnosis not present

## 2018-04-20 DIAGNOSIS — E119 Type 2 diabetes mellitus without complications: Secondary | ICD-10-CM | POA: Diagnosis not present

## 2018-04-20 DIAGNOSIS — Z96652 Presence of left artificial knee joint: Secondary | ICD-10-CM | POA: Insufficient documentation

## 2018-04-20 DIAGNOSIS — F419 Anxiety disorder, unspecified: Secondary | ICD-10-CM | POA: Diagnosis not present

## 2018-04-20 DIAGNOSIS — F17210 Nicotine dependence, cigarettes, uncomplicated: Secondary | ICD-10-CM | POA: Diagnosis not present

## 2018-04-20 DIAGNOSIS — D125 Benign neoplasm of sigmoid colon: Secondary | ICD-10-CM | POA: Diagnosis not present

## 2018-04-20 DIAGNOSIS — Z1211 Encounter for screening for malignant neoplasm of colon: Secondary | ICD-10-CM | POA: Diagnosis not present

## 2018-04-20 DIAGNOSIS — Z8249 Family history of ischemic heart disease and other diseases of the circulatory system: Secondary | ICD-10-CM | POA: Diagnosis not present

## 2018-04-20 DIAGNOSIS — E1151 Type 2 diabetes mellitus with diabetic peripheral angiopathy without gangrene: Secondary | ICD-10-CM | POA: Insufficient documentation

## 2018-04-20 DIAGNOSIS — I2510 Atherosclerotic heart disease of native coronary artery without angina pectoris: Secondary | ICD-10-CM | POA: Diagnosis not present

## 2018-04-20 DIAGNOSIS — Z7984 Long term (current) use of oral hypoglycemic drugs: Secondary | ICD-10-CM | POA: Insufficient documentation

## 2018-04-20 DIAGNOSIS — D124 Benign neoplasm of descending colon: Secondary | ICD-10-CM | POA: Diagnosis not present

## 2018-04-20 DIAGNOSIS — I10 Essential (primary) hypertension: Secondary | ICD-10-CM | POA: Insufficient documentation

## 2018-04-20 DIAGNOSIS — K635 Polyp of colon: Secondary | ICD-10-CM | POA: Diagnosis not present

## 2018-04-20 HISTORY — PX: COLONOSCOPY WITH PROPOFOL: SHX5780

## 2018-04-20 LAB — GLUCOSE, CAPILLARY: GLUCOSE-CAPILLARY: 159 mg/dL — AB (ref 70–99)

## 2018-04-20 SURGERY — COLONOSCOPY WITH PROPOFOL
Anesthesia: General

## 2018-04-20 MED ORDER — FENTANYL CITRATE (PF) 100 MCG/2ML IJ SOLN
INTRAMUSCULAR | Status: AC
  Filled 2018-04-20: qty 2

## 2018-04-20 MED ORDER — SODIUM CHLORIDE 0.9 % IV SOLN
INTRAVENOUS | Status: DC
  Administered 2018-04-20: 08:00:00 via INTRAVENOUS

## 2018-04-20 MED ORDER — PROPOFOL 500 MG/50ML IV EMUL
INTRAVENOUS | Status: AC
  Filled 2018-04-20: qty 50

## 2018-04-20 MED ORDER — MIDAZOLAM HCL 2 MG/2ML IJ SOLN
INTRAMUSCULAR | Status: AC
  Filled 2018-04-20: qty 2

## 2018-04-20 MED ORDER — PROPOFOL 500 MG/50ML IV EMUL
INTRAVENOUS | Status: DC | PRN
  Administered 2018-04-20: 120 ug/kg/min via INTRAVENOUS

## 2018-04-20 MED ORDER — MIDAZOLAM HCL 2 MG/2ML IJ SOLN
INTRAMUSCULAR | Status: DC | PRN
  Administered 2018-04-20: 2 mg via INTRAVENOUS

## 2018-04-20 MED ORDER — FENTANYL CITRATE (PF) 100 MCG/2ML IJ SOLN
INTRAMUSCULAR | Status: DC | PRN
  Administered 2018-04-20: 50 ug via INTRAVENOUS

## 2018-04-20 NOTE — Op Note (Signed)
[facility] Patient Name: Joshua Roy Procedure Date: 04/20/2018 8:47 AM MRN: [id number] Account #: [id number] Date of Birth: 22-Feb-1951 Admit Type: Outpatient Age: 67 Room: [facility] ENDO ROOM 4 Gender: Male Note Status: Finalized Procedure:            Colonoscopy Indications:          High risk colon cancer surveillance: Personal history                        of colonic polyps, last polyps were on 06/03/2010 Providers:            [name] MD, MD Referring MD:         [name], MD (Referring MD) Medicines:            Propofol per Anesthesia Complications:        No immediate complications. Procedure:            Pre-Anesthesia Assessment:                       - Prior to the procedure, a History and Physical was                        performed, and patient medications and allergies were                        reviewed. The patient's tolerance of previous                        anesthesia was also reviewed. The risks and benefits of                        the procedure and the sedation options and risks were                        discussed with the patient. All questions were                        answered, and informed consent was obtained. Prior                        Anticoagulants: The patient has taken no previous                        anticoagulant or antiplatelet agents. ASA Grade                        Assessment: II - A patient with mild systemic disease.                        After reviewing the risks and benefits, the patient was                        deemed in satisfactory condition to undergo the                        procedure.                       - Prior to the procedure, a History and Physical was  performed, and patient medications and allergies were                        reviewed. The patient's tolerance of previous                        anesthesia was also reviewed. The risks and  benefits of                        the procedure and the sedation options and risks were                        discussed with the patient. All questions were                        answered, and informed consent was obtained. Prior                        Anticoagulants: The patient has taken no previous                        anticoagulant or antiplatelet agents. ASA Grade                        Assessment: II - A patient with mild systemic disease.                        After reviewing the risks and benefits, the patient was                        deemed in satisfactory condition to undergo the                        procedure.                       After obtaining informed consent, the colonoscope was                        passed under direct vision. Throughout the procedure,                        the patient's blood pressure, pulse, and oxygen                        saturations were monitored continuously. The                        Colonoscope was introduced through the anus and                        advanced to the the cecum, identified by appendiceal                        orifice and ileocecal valve. The colonoscopy was                        performed without difficulty. The patient tolerated the                        procedure  well. The quality of the bowel preparation                        was good. Findings:      The perianal and digital rectal examinations were normal.      Two sessile polyps were found in the ascending colon. The polyps were 3       to 6 mm in size. These polyps were removed with a cold snare. Resection       and retrieval were complete.      Two sessile polyps were found in the descending colon. The polyps were 3       to 4 mm in size. These polyps were removed with a cold snare. Resection       and retrieval were complete.      A 4 mm polyp was found in the sigmoid colon. The polyp was sessile. The       polyp was removed with a cold snare.  Resection and retrieval were       complete. Impression:           - Two 3 to 6 mm polyps in the ascending colon, removed                        with a cold snare. Resected and retrieved.                       - Two 3 to 4 mm polyps in the descending colon, removed                        with a cold snare. Resected and retrieved.                       - One 4 mm polyp in the sigmoid colon, removed with a                        cold snare. Resected and retrieved. Recommendation:       - Discharge patient to home.                       - Resume previous diet.                       - Continue present medications.                       - Await pathology results.                       - Repeat colonoscopy in 3 years for surveillance. Procedure Code(s):    --- Professional ---                       [phone], Colonoscopy, flexible; with removal of tumor(s),                        polyp(s), or other lesion(s) by snare technique Diagnosis Code(s):    --- Professional ---                       Z86.010, Personal history of colonic polyps  D12.2, Benign neoplasm of ascending colon                       D12.4, Benign neoplasm of descending colon                       D12.5, Benign neoplasm of sigmoid colon CPT copyright 2017 American Medical Association. All rights reserved. The codes documented in this report are preliminary and upon coder review may  be revised to meet current compliance requirements. [name] MD, MD 04/20/2018 9:08:21 AM This report has been signed electronically. Number of Addenda: 0 Note Initiated On: 04/20/2018 8:47 AM Scope Withdrawal Time: 0 hours 6 minutes 36 seconds  Total Procedure Duration: 0 hours 9 minutes 59 seconds       [facility]

## 2018-04-20 NOTE — H&P (Signed)
Joshua Lame, MD [name] [name], [place] [address] Phone:[phone] Fax : [phone]  Primary Care Physician:  Joshua Athens, MD Primary Gastroenterologist:  Dr. Allen Roy  Pre-Procedure History & Physical: HPI:  Joshua Roy is a 67 y.o. male is here for an colonoscopy.   Past Medical History:  Diagnosis Date  . ACE-inhibitor cough   . Anxiety   . Bronchitis 06/27/2016  . COPD (chronic obstructive pulmonary disease) (HCC)    cath, mild inf. hypokinesis EF 49%, 100% ROA  . Coronary artery disease 2001  . Coronary atherosclerosis   . Diabetes mellitus type II   . Hyperlipidemia   . Hypertension   . MI (myocardial infarction) ([place]) age 10  . OA (osteoarthritis)    knee OA, injected 2012 by ortho  . Pneumonia     Past Surgical History:  Procedure Laterality Date  . CARDIAC CATHETERIZATION  05/06/2000   @ [facility]  . ESOPHAGOGASTRODUODENOSCOPY ENDOSCOPY    . KNEE ARTHROSCOPY  07/25/04   left  . [place]   right, open surgery- repair  . 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 TOTAL KNEE ARTHROPLASTY Right 07/13/2017   Procedure: RIGHT TOTAL KNEE ARTHROPLASTY;  Surgeon: Gaynelle Arabian, MD;  Location: [facility];  Service: Orthopedics;  Laterality: Right;    Prior to Admission medications   Medication Sig Start Date End Date Taking? Authorizing Provider  atorvastatin (LIPITOR) 80 MG tablet Take 1 tablet (80 mg total) by mouth daily. 11/21/14  Yes Tonia Ghent, MD  clopidogrel (PLAVIX) 75 MG tablet Take 75 mg by mouth daily.   Yes [provider]  fenofibrate 160 MG tablet Take 1 tablet (160 mg total) by mouth daily. 01/11/15  Yes Tonia Ghent, MD  glimepiride (AMARYL) 2 MG tablet Take one half tablet (1 mg) by mouth each morning Patient taking differently: Take 1 mg by mouth daily with  breakfast.  01/11/15  Yes Tonia Ghent, MD  losartan (COZAAR) 25 MG tablet Take 1 tablet (25 mg total) by mouth daily. 01/11/15  Yes Tonia Ghent, MD  metFORMIN (GLUCOPHAGE) 1000 MG tablet Take 1 tablet (1,000 mg total) by mouth 2 (two) times daily. 01/11/15  Yes Tonia Ghent, MD  metoprolol tartrate (LOPRESSOR) 25 MG tablet Take 1 tablet (25 mg total) by mouth 2 (two) times daily. 01/11/15  Yes Tonia Ghent, MD  pantoprazole (PROTONIX) 40 MG tablet Take 1 tablet (40 mg total) by mouth daily. 02/13/15  Yes Tonia Ghent, MD  acetaminophen (TYLENOL) 500 MG tablet Take 1,000 mg by mouth every 8 (eight) hours as needed for mild pain or headache.    [provider]  aspirin EC 325 MG EC tablet Take 1 tablet (325 mg total) by mouth daily. Take a full dose 325 mg Aspirin daily for three more weeks. Once the patient has completed the full dose Aspirin, then reduce to a Baby 81 mg Aspirin daily for three more weeks. Patient not taking: Reported on 04/20/2018 07/14/17   Dara Lords, Alexzandrew L, PA-C  ezetimibe (ZETIA) 10 MG tablet Take 1 tablet (10 mg total) by mouth daily. Patient not taking: Reported on 07/02/2017 08/15/15   Minna Merritts, MD  gabapentin (NEURONTIN) 300 MG capsule Take 1 capsule (300 mg total) by mouth 3 (three) times daily. Gabapentin 300 mg Protocol Take a 300  mg capsule three times a day for one week, Then a 300 mg capsule twice a day for one week, Then a 300 mg capsule once a day for one week, then discontinue the Gabapentin. Patient not taking: Reported on 04/20/2018 07/14/17   Dara Lords, Alexzandrew L, PA-C  glucose blood (ACCU-CHEK AVIVA PLUS) test strip Use to check blood sugar once daily or as needed.  Diagnosis:  E11.9   Non insulin-dependent. 12/14/14   Tonia Ghent, MD  Lancets (ACCU-CHEK MULTICLIX) lancets Use as instructed to check blood sugar once daily or as needed.  Diagnosis:  E11.9   Non insulin-dependent. 12/14/14   Tonia Ghent, MD    methocarbamol (ROBAXIN) 500 MG tablet Take 1 tablet (500 mg total) by mouth every 6 (six) hours as needed for muscle spasms. Patient not taking: Reported on 04/20/2018 07/14/17   Dara Lords, Alexzandrew L, PA-C  oxyCODONE (OXY IR/ROXICODONE) 5 MG immediate release tablet Take 1-2 tablets (5-10 mg total) by mouth every 4 (four) hours as needed for moderate pain or severe pain. Patient not taking: Reported on 04/20/2018 07/14/17   Dara Lords, Alexzandrew L, PA-C  traMADol (ULTRAM) 50 MG tablet Take 1-2 tablets (50-100 mg total) by mouth every 6 (six) hours as needed (mild pain). Patient not taking: Reported on 04/20/2018 07/14/17   Dara Lords, Alexzandrew L, PA-C    Allergies as of 03/12/2018 - Review Complete 07/13/2017  Allergen Reaction Noted  . Mucinex d [pseudoephedrine-guaifenesin er]  08/01/2013  . Ramipril      Family History  Problem Relation Age of Onset  . Heart disease Father        CAD, MI x 3  . Hypertension Father   . Alcohol abuse Father   . Diabetes Father   . Diabetes Sister   . Cancer Brother        lymphoma, in remission  . Colon cancer Brother   . Heart disease Sister        CABG x 3  . Prostate cancer Neg Hx     Social History   Socioeconomic History  . Marital status: Married    Spouse name: Not on file  . Number of children: 3  . Years of education: Not on file  . Highest education level: Not on file  Occupational History  . Occupation: Engineer, manufacturing, Estate agent  Social Needs  . Financial resource strain: Not on file  . Food insecurity:    Worry: Not on file    Inability: Not on file  . Transportation needs:    Medical: Not on file    Non-medical: Not on file  Tobacco Use  . Smoking status: Current Every Day Smoker    Packs/day: 1.00    Years: 40.00    Pack years: 40.00  . Smokeless tobacco: Never Used  Substance and Sexual Activity  . Alcohol use: Yes    Alcohol/week: 0.0 standard drinks    Comment: 5-6 cocktails, 1 times a week  . Drug use:  No  . Sexual activity: Not on file  Lifestyle  . Physical activity:    Days per week: Not on file    Minutes per session: Not on file  . Stress: Not on file  Relationships  . Social connections:    Talks on phone: Not on file    Gets together: Not on file    Attends religious service: Not on file    Active member of club or organization: Not on file    Attends meetings  of clubs or organizations: Not on file    Relationship status: Not on file  . Intimate partner violence:    Fear of current or ex partner: Not on file    Emotionally abused: Not on file    Physically abused: Not on file    Forced sexual activity: Not on file  Other Topics Concern  . Not on file  Social History Narrative   From [place].  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    Review of Systems: See HPI, otherwise negative ROS  Physical Exam: BP (!) 161/82   Pulse 88   Temp (!) 96 F (35.6 C) (Tympanic)   Resp 20   Ht 5\' 10"  (1.778 m)   Wt 106.6 kg   SpO2 96%   BMI 33.72 kg/m  General:   Alert,  pleasant and cooperative in NAD Head:  Normocephalic and atraumatic. Neck:  Supple; no masses or thyromegaly. Lungs:  Clear throughout to auscultation.    Heart:  Regular rate and rhythm. Abdomen:  Soft, nontender and nondistended. Normal bowel sounds, without guarding, and without rebound.   Neurologic:  Alert and  oriented x4;  grossly normal neurologically.  Impression/Plan: Kanai Berrios Seider is here for an colonoscopy to be performed for history of colon polyps on 06/03/2010  Risks, benefits, limitations, and alternatives regarding  colonoscopy have been reviewed with the patient.  Questions have been answered.  All parties agreeable.   Joshua Lame, MD  04/20/2018, 8:39 AM

## 2018-04-20 NOTE — Anesthesia Postprocedure Evaluation (Signed)
Anesthesia Post Note  Patient: Joshua Roy  Procedure(s) Performed: COLONOSCOPY WITH PROPOFOL (N/A )  Patient location during evaluation: PACU Anesthesia Type: General Level of consciousness: awake and alert Pain management: pain level controlled Vital Signs Assessment: post-procedure vital signs reviewed and stable Respiratory status: spontaneous breathing, nonlabored ventilation, respiratory function stable and patient connected to nasal cannula oxygen Cardiovascular status: blood pressure returned to baseline and stable Postop Assessment: no apparent nausea or vomiting Anesthetic complications: no     Last Vitals:  Vitals:   04/20/18 0934 04/20/18 0944  BP: (!) 147/69 140/75  Pulse:    Resp:  20  Temp:    SpO2:      Last Pain:  Vitals:   04/20/18 0944  TempSrc:   PainSc: 0-No pain                 Durenda Hurt

## 2018-04-20 NOTE — Anesthesia Preprocedure Evaluation (Addendum)
Anesthesia Evaluation  Patient identified by MRN, date of birth, ID band Patient awake    Reviewed: Allergy & Precautions, H&P , NPO status , Patient's Chart, lab work & pertinent test results  Airway Mallampati: III  TM Distance: >3 FB Neck ROM: full    Dental  (+) Teeth Intact   Pulmonary COPD, Current Smoker,     + wheezing (scattered wheeze)      Cardiovascular Exercise Tolerance: Good hypertension, + CAD, + Past MI (many years ago) and + Peripheral Vascular Disease   Rhythm:regular Rate:Normal  Pt reports normal stress test 1 year ago   Neuro/Psych Anxiety negative neurological ROS  negative psych ROS   GI/Hepatic Neg liver ROS, PUD,   Endo/Other  diabetes  Renal/GU negative Renal ROS  negative genitourinary   Musculoskeletal  (+) Arthritis ,   Abdominal   Peds  Hematology negative hematology ROS (+)   Anesthesia Other Findings Past Medical History: No date: ACE-inhibitor cough No date: Anxiety 06/27/2016: Bronchitis No date: COPD (chronic obstructive pulmonary disease) (HCC)     Comment:  cath, mild inf. hypokinesis EF 49%, 100% ROA 2001: Coronary artery disease No date: Coronary atherosclerosis No date: Diabetes mellitus type II No date: Hyperlipidemia No date: Hypertension age 67: MI (myocardial infarction) ([place]) No date: OA (osteoarthritis)     Comment:  knee OA, injected 2012 by ortho No date: Pneumonia  Past Surgical History: 05/06/2000: CARDIAC CATHETERIZATION     Comment:  @ [facility] No date: ESOPHAGOGASTRODUODENOSCOPY ENDOSCOPY 07/25/04: KNEE ARTHROSCOPY     Comment:  left 1969: KNEE SURGERY     Comment:  right, open surgery- repair 07/14/2016: TOTAL KNEE ARTHROPLASTY; Left     Comment:  Procedure: LEFT TOTAL KNEE ARTHROPLASTY;  Surgeon: Gaynelle Arabian, MD;  Location: [facility];  Service: Orthopedics;                Laterality: Left; 07/13/2017: TOTAL KNEE ARTHROPLASTY; Right   Comment:  Procedure: RIGHT TOTAL KNEE ARTHROPLASTY;  Surgeon:               Gaynelle Arabian, MD;  Location: [facility];  Service:               Orthopedics;  Laterality: Right;  BMI    Body Mass Index:  33.72 kg/m      Reproductive/Obstetrics negative OB ROS                            Anesthesia Physical Anesthesia Plan  ASA: III  Anesthesia Plan: General   Post-op Pain Management:    Induction:   PONV Risk Score and Plan: Propofol infusion and TIVA  Airway Management Planned: Natural Airway and Nasal Cannula  Additional Equipment:   Intra-op Plan:   Post-operative Plan:   Informed Consent: I have reviewed the patients History and Physical, chart, labs and discussed the procedure including the risks, benefits and alternatives for the proposed anesthesia with the patient or authorized representative who has indicated his/her understanding and acceptance.   Dental Advisory Given  Plan Discussed with: Anesthesiologist, CRNA and Surgeon  Anesthesia Plan Comments:         Anesthesia Quick Evaluation

## 2018-04-20 NOTE — Transfer of Care (Signed)
Immediate Anesthesia Transfer of Care Note  Patient: Robby Bulkley Gianino  Procedure(s) Performed: COLONOSCOPY WITH PROPOFOL (N/A )  Patient Location: PACU  Anesthesia Type:General  Level of Consciousness: awake and sedated  Airway & Oxygen Therapy: Patient Spontanous Breathing and Patient connected to face mask oxygen  Post-op Assessment: Report given to RN and Post -op Vital signs reviewed and stable  Post vital signs: Reviewed and stable  Last Vitals:  Vitals Value Taken Time  BP    Temp    Pulse 78 04/20/2018  9:14 AM  Resp 12 04/20/2018  9:14 AM  SpO2 97 % 04/20/2018  9:14 AM  Vitals shown include unvalidated device data.  Last Pain:  Vitals:   04/20/18 0914  TempSrc: (P) Tympanic         Complications: No apparent anesthesia complications

## 2018-04-20 NOTE — Anesthesia Post-op Follow-up Note (Signed)
Anesthesia QCDR form completed.        

## 2018-04-21 LAB — SURGICAL PATHOLOGY

## 2018-04-22 ENCOUNTER — Encounter: Payer: Self-pay | Admitting: Gastroenterology

## 2018-05-10 DIAGNOSIS — Z23 Encounter for immunization: Secondary | ICD-10-CM | POA: Diagnosis not present

## 2018-06-22 DIAGNOSIS — E119 Type 2 diabetes mellitus without complications: Secondary | ICD-10-CM | POA: Diagnosis not present

## 2018-06-29 DIAGNOSIS — E1142 Type 2 diabetes mellitus with diabetic polyneuropathy: Secondary | ICD-10-CM | POA: Diagnosis not present

## 2018-06-29 DIAGNOSIS — E785 Hyperlipidemia, unspecified: Secondary | ICD-10-CM | POA: Diagnosis not present

## 2018-06-29 DIAGNOSIS — R609 Edema, unspecified: Secondary | ICD-10-CM | POA: Diagnosis not present

## 2018-06-29 DIAGNOSIS — S336XXS Sprain of sacroiliac joint, sequela: Secondary | ICD-10-CM | POA: Diagnosis not present

## 2018-12-27 DIAGNOSIS — H6121 Impacted cerumen, right ear: Secondary | ICD-10-CM | POA: Diagnosis not present

## 2018-12-27 DIAGNOSIS — H903 Sensorineural hearing loss, bilateral: Secondary | ICD-10-CM | POA: Diagnosis not present

## 2019-02-24 ENCOUNTER — Other Ambulatory Visit: Payer: Self-pay

## 2019-03-28 DIAGNOSIS — I872 Venous insufficiency (chronic) (peripheral): Secondary | ICD-10-CM | POA: Diagnosis not present

## 2019-03-28 DIAGNOSIS — L820 Inflamed seborrheic keratosis: Secondary | ICD-10-CM | POA: Diagnosis not present

## 2019-03-28 DIAGNOSIS — L538 Other specified erythematous conditions: Secondary | ICD-10-CM | POA: Diagnosis not present

## 2019-03-28 DIAGNOSIS — L821 Other seborrheic keratosis: Secondary | ICD-10-CM | POA: Diagnosis not present

## 2019-03-28 DIAGNOSIS — D2272 Melanocytic nevi of left lower limb, including hip: Secondary | ICD-10-CM | POA: Diagnosis not present

## 2019-03-28 DIAGNOSIS — D2271 Melanocytic nevi of right lower limb, including hip: Secondary | ICD-10-CM | POA: Diagnosis not present

## 2019-03-28 DIAGNOSIS — D2262 Melanocytic nevi of left upper limb, including shoulder: Secondary | ICD-10-CM | POA: Diagnosis not present

## 2019-03-28 DIAGNOSIS — D225 Melanocytic nevi of trunk: Secondary | ICD-10-CM | POA: Diagnosis not present

## 2019-03-28 DIAGNOSIS — D2261 Melanocytic nevi of right upper limb, including shoulder: Secondary | ICD-10-CM | POA: Diagnosis not present

## 2019-05-10 DIAGNOSIS — E119 Type 2 diabetes mellitus without complications: Secondary | ICD-10-CM | POA: Diagnosis not present

## 2019-05-10 DIAGNOSIS — C61 Malignant neoplasm of prostate: Secondary | ICD-10-CM | POA: Diagnosis not present

## 2019-05-10 DIAGNOSIS — E7849 Other hyperlipidemia: Secondary | ICD-10-CM | POA: Diagnosis not present

## 2019-05-10 DIAGNOSIS — R5381 Other malaise: Secondary | ICD-10-CM | POA: Diagnosis not present

## 2019-05-10 DIAGNOSIS — I10 Essential (primary) hypertension: Secondary | ICD-10-CM | POA: Diagnosis not present

## 2019-08-02 ENCOUNTER — Other Ambulatory Visit: Payer: Self-pay

## 2019-08-02 DIAGNOSIS — R5383 Other fatigue: Secondary | ICD-10-CM | POA: Diagnosis not present

## 2019-08-02 DIAGNOSIS — R55 Syncope and collapse: Secondary | ICD-10-CM | POA: Insufficient documentation

## 2019-08-02 DIAGNOSIS — R4781 Slurred speech: Secondary | ICD-10-CM | POA: Diagnosis not present

## 2019-08-02 DIAGNOSIS — W19XXXA Unspecified fall, initial encounter: Secondary | ICD-10-CM | POA: Diagnosis not present

## 2019-08-02 DIAGNOSIS — Z5321 Procedure and treatment not carried out due to patient leaving prior to being seen by health care provider: Secondary | ICD-10-CM | POA: Insufficient documentation

## 2019-08-02 DIAGNOSIS — R42 Dizziness and giddiness: Secondary | ICD-10-CM | POA: Diagnosis not present

## 2019-08-02 DIAGNOSIS — R000 Tachycardia, unspecified: Secondary | ICD-10-CM | POA: Diagnosis not present

## 2019-08-02 LAB — CBC
HCT: 48 % (ref 39.0–52.0)
Hemoglobin: 14.3 g/dL (ref 13.0–17.0)
MCH: 23.8 pg — ABNORMAL LOW (ref 26.0–34.0)
MCHC: 29.8 g/dL — ABNORMAL LOW (ref 30.0–36.0)
MCV: 79.9 fL — ABNORMAL LOW (ref 80.0–100.0)
Platelets: 325 10*3/uL (ref 150–400)
RBC: 6.01 MIL/uL — ABNORMAL HIGH (ref 4.22–5.81)
RDW: 17.2 % — ABNORMAL HIGH (ref 11.5–15.5)
WBC: 10.7 10*3/uL — ABNORMAL HIGH (ref 4.0–10.5)
nRBC: 0 % (ref 0.0–0.2)

## 2019-08-02 LAB — GLUCOSE, CAPILLARY: Glucose-Capillary: 199 mg/dL — ABNORMAL HIGH (ref 70–99)

## 2019-08-02 NOTE — ED Triage Notes (Addendum)
Brought to ER with EMS from home after near syncopal episode. VSS with EMS, 202 CBG. Pt reports near syncope, wife reports syncope while sitting down in car. Pt alert and oriented X 4. No injuries, color WNL. No stroke sx present. Reports recent difficulty sleeping and generalized fatigue. States he fell asleep in car, did not pass out as wife reported to EMS.

## 2019-08-03 ENCOUNTER — Telehealth: Payer: Self-pay | Admitting: Emergency Medicine

## 2019-08-03 ENCOUNTER — Emergency Department
Admission: EM | Admit: 2019-08-03 | Discharge: 2019-08-03 | Disposition: A | Discharge status: Home / Self Care | Payer: Medicare Other | Attending: Emergency Medicine | Admitting: Emergency Medicine

## 2019-08-03 LAB — BASIC METABOLIC PANEL
Anion gap: 13 (ref 5–15)
BUN: 23 mg/dL (ref 8–23)
CO2: 27 mmol/L (ref 22–32)
Calcium: 8.9 mg/dL (ref 8.9–10.3)
Chloride: 95 mmol/L — ABNORMAL LOW (ref 98–111)
Creatinine, Ser: 1.75 mg/dL — ABNORMAL HIGH (ref 0.61–1.24)
GFR calc Af Amer: 45 mL/min — ABNORMAL LOW (ref >60)
GFR calc non Af Amer: 39 mL/min — ABNORMAL LOW (ref >60)
Glucose, Bld: 225 mg/dL — ABNORMAL HIGH (ref 70–99)
Potassium: 4.9 mmol/L (ref 3.5–5.1)
Sodium: 135 mmol/L (ref 135–145)

## 2019-08-03 LAB — TROPONIN I (HIGH SENSITIVITY): Troponin I (High Sensitivity): 13 ng/L (ref <18)

## 2019-08-03 NOTE — ED Notes (Signed)
No answer when called several times from lobby 

## 2019-08-03 NOTE — Telephone Encounter (Signed)
Called patient due to lwot to inquire about condition and follow up plans. Says he has appt with pcp.  Michela Pitcher he was just tired when it happened.  When I asked to clarify he says he got dizzy and fell over.  I told him that he really needs to come back if he feels any dizziness.  I told him that the labs area available as well.

## 2019-08-08 ENCOUNTER — Other Ambulatory Visit (HOSPITAL_COMMUNITY): Payer: Self-pay | Admitting: Internal Medicine

## 2019-08-08 ENCOUNTER — Other Ambulatory Visit: Payer: Self-pay | Admitting: Internal Medicine

## 2019-08-08 DIAGNOSIS — R55 Syncope and collapse: Secondary | ICD-10-CM | POA: Diagnosis not present

## 2019-08-08 DIAGNOSIS — E119 Type 2 diabetes mellitus without complications: Secondary | ICD-10-CM | POA: Diagnosis not present

## 2019-08-08 DIAGNOSIS — E1142 Type 2 diabetes mellitus with diabetic polyneuropathy: Secondary | ICD-10-CM | POA: Diagnosis not present

## 2019-08-08 DIAGNOSIS — R0602 Shortness of breath: Secondary | ICD-10-CM

## 2019-08-08 DIAGNOSIS — R1900 Intra-abdominal and pelvic swelling, mass and lump, unspecified site: Secondary | ICD-10-CM

## 2019-08-08 DIAGNOSIS — R188 Other ascites: Secondary | ICD-10-CM | POA: Diagnosis not present

## 2019-08-08 DIAGNOSIS — R609 Edema, unspecified: Secondary | ICD-10-CM | POA: Diagnosis not present

## 2019-08-09 ENCOUNTER — Emergency Department: Payer: Medicare Other

## 2019-08-09 ENCOUNTER — Other Ambulatory Visit: Payer: Self-pay

## 2019-08-09 ENCOUNTER — Emergency Department
Admission: EM | Admit: 2019-08-09 | Discharge: 2019-08-09 | Disposition: A | Discharge status: Home / Self Care | Payer: Medicare Other | Attending: Emergency Medicine | Admitting: Emergency Medicine

## 2019-08-09 ENCOUNTER — Encounter: Payer: Self-pay | Admitting: Emergency Medicine

## 2019-08-09 DIAGNOSIS — I2510 Atherosclerotic heart disease of native coronary artery without angina pectoris: Secondary | ICD-10-CM | POA: Diagnosis not present

## 2019-08-09 DIAGNOSIS — R0902 Hypoxemia: Secondary | ICD-10-CM | POA: Diagnosis not present

## 2019-08-09 DIAGNOSIS — R0602 Shortness of breath: Secondary | ICD-10-CM | POA: Diagnosis not present

## 2019-08-09 DIAGNOSIS — F17210 Nicotine dependence, cigarettes, uncomplicated: Secondary | ICD-10-CM | POA: Insufficient documentation

## 2019-08-09 DIAGNOSIS — J449 Chronic obstructive pulmonary disease, unspecified: Secondary | ICD-10-CM | POA: Insufficient documentation

## 2019-08-09 DIAGNOSIS — Z96651 Presence of right artificial knee joint: Secondary | ICD-10-CM | POA: Diagnosis not present

## 2019-08-09 DIAGNOSIS — R609 Edema, unspecified: Secondary | ICD-10-CM | POA: Diagnosis not present

## 2019-08-09 DIAGNOSIS — Z79899 Other long term (current) drug therapy: Secondary | ICD-10-CM | POA: Diagnosis not present

## 2019-08-09 DIAGNOSIS — Z96652 Presence of left artificial knee joint: Secondary | ICD-10-CM | POA: Insufficient documentation

## 2019-08-09 DIAGNOSIS — I10 Essential (primary) hypertension: Secondary | ICD-10-CM | POA: Diagnosis not present

## 2019-08-09 DIAGNOSIS — Z7984 Long term (current) use of oral hypoglycemic drugs: Secondary | ICD-10-CM | POA: Diagnosis not present

## 2019-08-09 DIAGNOSIS — Z20822 Contact with and (suspected) exposure to covid-19: Secondary | ICD-10-CM | POA: Diagnosis not present

## 2019-08-09 DIAGNOSIS — R600 Localized edema: Secondary | ICD-10-CM | POA: Diagnosis not present

## 2019-08-09 DIAGNOSIS — E119 Type 2 diabetes mellitus without complications: Secondary | ICD-10-CM | POA: Insufficient documentation

## 2019-08-09 DIAGNOSIS — Z7982 Long term (current) use of aspirin: Secondary | ICD-10-CM | POA: Diagnosis not present

## 2019-08-09 DIAGNOSIS — R188 Other ascites: Secondary | ICD-10-CM | POA: Diagnosis not present

## 2019-08-09 DIAGNOSIS — R55 Syncope and collapse: Secondary | ICD-10-CM | POA: Diagnosis not present

## 2019-08-09 LAB — CBC
HCT: 45.3 % (ref 39.0–52.0)
Hemoglobin: 13.6 g/dL (ref 13.0–17.0)
MCH: 23.4 pg — ABNORMAL LOW (ref 26.0–34.0)
MCHC: 30 g/dL (ref 30.0–36.0)
MCV: 78.1 fL — ABNORMAL LOW (ref 80.0–100.0)
Platelets: 295 10*3/uL (ref 150–400)
RBC: 5.8 MIL/uL (ref 4.22–5.81)
RDW: 17.8 % — ABNORMAL HIGH (ref 11.5–15.5)
WBC: 8.3 10*3/uL (ref 4.0–10.5)
nRBC: 0 % (ref 0.0–0.2)

## 2019-08-09 LAB — COMPREHENSIVE METABOLIC PANEL
ALT: 23 U/L (ref 0–44)
AST: 26 U/L (ref 15–41)
Albumin: 3.7 g/dL (ref 3.5–5.0)
Alkaline Phosphatase: 28 U/L — ABNORMAL LOW (ref 38–126)
Anion gap: 10 (ref 5–15)
BUN: 27 mg/dL — ABNORMAL HIGH (ref 8–23)
CO2: 33 mmol/L — ABNORMAL HIGH (ref 22–32)
Calcium: 8.9 mg/dL (ref 8.9–10.3)
Chloride: 94 mmol/L — ABNORMAL LOW (ref 98–111)
Creatinine, Ser: 1.57 mg/dL — ABNORMAL HIGH (ref 0.61–1.24)
GFR calc Af Amer: 51 mL/min — ABNORMAL LOW (ref >60)
GFR calc non Af Amer: 44 mL/min — ABNORMAL LOW (ref >60)
Glucose, Bld: 149 mg/dL — ABNORMAL HIGH (ref 70–99)
Potassium: 4.5 mmol/L (ref 3.5–5.1)
Sodium: 137 mmol/L (ref 135–145)
Total Bilirubin: 1 mg/dL (ref 0.3–1.2)
Total Protein: 7 g/dL (ref 6.5–8.1)

## 2019-08-09 LAB — TROPONIN I (HIGH SENSITIVITY): Troponin I (High Sensitivity): 28 ng/L — ABNORMAL HIGH (ref <18)

## 2019-08-09 LAB — BRAIN NATRIURETIC PEPTIDE: B Natriuretic Peptide: 401 pg/mL — ABNORMAL HIGH (ref 0.0–100.0)

## 2019-08-09 MED ORDER — IOHEXOL 350 MG/ML SOLN
60.0000 mL | Freq: Once | INTRAVENOUS | Status: AC | PRN
  Administered 2019-08-09: 60 mL via INTRAVENOUS

## 2019-08-09 NOTE — ED Provider Notes (Signed)
[facility] Emergency Department Provider Note   ____________________________________________   I have reviewed the triage vital signs and the nursing notes.   HISTORY  Chief Complaint Leg Swelling, Groin Swelling, Weakness, and Shortness of Breath   History limited by: Not Limited   HPI Joshua Roy is a 69 y.o. male who presents to the emergency department today because of concern for leg and groin swelling, weight gain and an episode of syncope that occurred roughly 1 week ago. The patient states that the he passed out roughly 1 week ago after getting up to leave a friends house. He became dizzy and fell to the ground. It did take him a minute to recover and he was apparently slurring some of his speech as he came to. Came to the emergency department but left prior to being seen. Since then he has gained 25 pounds. Has had shortness of breath, especially with exertion. The patient states he has a history of a heart attack but no history of heart failure. His doctor did prescribe him a diuretic after the swelling started.   Records reviewed. Per medical record review patient has a history of CAD, HTN, HLD. MI.  Past Medical History:  Diagnosis Date  . ACE-inhibitor cough   . Anxiety   . Bronchitis 06/27/2016  . COPD (chronic obstructive pulmonary disease) (HCC)    cath, mild inf. hypokinesis EF 49%, 100% ROA  . Coronary artery disease 2001  . Coronary atherosclerosis   . Diabetes mellitus type II   . Hyperlipidemia   . Hypertension   . MI (myocardial infarction) ([place]) age 28  . OA (osteoarthritis)    knee OA, injected 2012 by ortho  . Pneumonia     Patient Active Problem List   Diagnosis Date Noted  . Personal history of colonic polyps   . Benign neoplasm of ascending colon   . Benign neoplasm of descending colon   . Polyp of sigmoid colon   . OA (osteoarthritis) of knee 07/14/2016  . Medicare annual wellness visit, initial 11/23/2014   . Advance care planning 11/23/2014  . Erectile dysfunction associated with type 2 diabetes mellitus ([place]) 10/03/2013  . Seborrheic keratoses 10/03/2013  . Morbid obesity ([place]) 10/03/2013  . Cough 08/01/2013  . Gastric ulcer 05/16/2013  . CAD (coronary artery disease) 05/21/2011  . SOB (shortness of breath) 05/12/2011  . Knee pain 11/18/2010  . Essential hypertension 01/07/2007  . Uncontrolled type 2 diabetes mellitus with circulatory disorder causing erectile dysfunction ([place]) 01/06/2007  . Hyperlipidemia 01/06/2007  . Irritability 01/06/2007  . DISORDER, TOBACCO USE 01/06/2007  . COPD (chronic obstructive pulmonary disease) ([place]) 01/06/2007    Past Surgical History:  Procedure Laterality Date  . CARDIAC CATHETERIZATION  05/06/2000   @ [place]  . COLONOSCOPY WITH PROPOFOL N/A 04/20/2018   Procedure: COLONOSCOPY WITH PROPOFOL;  Surgeon: [name], MD;  Location: [facility] ENDOSCOPY;  Service: Endoscopy;  Laterality: N/A;  . ESOPHAGOGASTRODUODENOSCOPY ENDOSCOPY    . KNEE ARTHROSCOPY  07/25/04   left  . [place]   right, open surgery- repair  . 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 TOTAL KNEE ARTHROPLASTY Right 07/13/2017   Procedure: RIGHT TOTAL KNEE ARTHROPLASTY;  Surgeon: Gaynelle Arabian, MD;  Location: [facility] ORS;  Service: Orthopedics;  Laterality: Right;    Prior to Admission medications   Medication Sig Start Date  End Date Taking? Authorizing Provider  acetaminophen (TYLENOL) 500 MG tablet Take 1,000 mg by mouth every 8 (eight) hours as needed for mild pain or headache.    [provider]  aspirin EC 325 MG EC tablet Take 1 tablet (325 mg total) by mouth daily. Take a full dose 325 mg Aspirin daily for three more weeks. Once the patient has completed the full dose Aspirin, then reduce to a Baby 81 mg Aspirin daily for three more  weeks. Patient not taking: Reported on 04/20/2018 07/14/17   Dara Lords, Alexzandrew L, PA-C  atorvastatin (LIPITOR) 80 MG tablet Take 1 tablet (80 mg total) by mouth daily. 11/21/14   Tonia Ghent, MD  clopidogrel (PLAVIX) 75 MG tablet Take 75 mg by mouth daily.    [provider]  ezetimibe (ZETIA) 10 MG tablet Take 1 tablet (10 mg total) by mouth daily. Patient not taking: Reported on 07/02/2017 08/15/15   Minna Merritts, MD  fenofibrate 160 MG tablet Take 1 tablet (160 mg total) by mouth daily. 01/11/15   Tonia Ghent, MD  gabapentin (NEURONTIN) 300 MG capsule Take 1 capsule (300 mg total) by mouth 3 (three) times daily. Gabapentin 300 mg Protocol Take a 300 mg capsule three times a day for one week, Then a 300 mg capsule twice a day for one week, Then a 300 mg capsule once a day for one week, then discontinue the Gabapentin. Patient not taking: Reported on 04/20/2018 07/14/17   Dara Lords, Alexzandrew L, PA-C  glimepiride (AMARYL) 2 MG tablet Take one half tablet (1 mg) by mouth each morning Patient taking differently: Take 1 mg by mouth daily with breakfast.  01/11/15   Tonia Ghent, MD  glucose blood (ACCU-CHEK AVIVA PLUS) test strip Use to check blood sugar once daily or as needed.  Diagnosis:  E11.9   Non insulin-dependent. 12/14/14   Tonia Ghent, MD  Lancets (ACCU-CHEK MULTICLIX) lancets Use as instructed to check blood sugar once daily or as needed.  Diagnosis:  E11.9   Non insulin-dependent. 12/14/14   Tonia Ghent, MD  losartan (COZAAR) 25 MG tablet Take 1 tablet (25 mg total) by mouth daily. 01/11/15   Tonia Ghent, MD  metFORMIN (GLUCOPHAGE) 1000 MG tablet Take 1 tablet (1,000 mg total) by mouth 2 (two) times daily. 01/11/15   Tonia Ghent, MD  methocarbamol (ROBAXIN) 500 MG tablet Take 1 tablet (500 mg total) by mouth every 6 (six) hours as needed for muscle spasms. Patient not taking: Reported on 04/20/2018 07/14/17   Dara Lords, Alexzandrew L, PA-C   metoprolol tartrate (LOPRESSOR) 25 MG tablet Take 1 tablet (25 mg total) by mouth 2 (two) times daily. 01/11/15   Tonia Ghent, MD  oxyCODONE (OXY IR/ROXICODONE) 5 MG immediate release tablet Take 1-2 tablets (5-10 mg total) by mouth every 4 (four) hours as needed for moderate pain or severe pain. Patient not taking: Reported on 04/20/2018 07/14/17   Dara Lords, Alexzandrew L, PA-C  pantoprazole (PROTONIX) 40 MG tablet Take 1 tablet (40 mg total) by mouth daily. 02/13/15   Tonia Ghent, MD  traMADol (ULTRAM) 50 MG tablet Take 1-2 tablets (50-100 mg total) by mouth every 6 (six) hours as needed (mild pain). Patient not taking: Reported on 04/20/2018 07/14/17   Dara Lords, Alexzandrew L, PA-C    Allergies Mucinex d [pseudoephedrine-guaifenesin er] and Ramipril  Family History  Problem Relation Age of Onset  . Heart disease Father        CAD,  MI x 3  . Hypertension Father   . Alcohol abuse Father   . Diabetes Father   . Diabetes Sister   . Cancer Brother        lymphoma, in remission  . Colon cancer Brother   . Heart disease Sister        CABG x 3  . Prostate cancer Neg Hx     Social History Social History   Tobacco Use  . Smoking status: Current Every Day Smoker    Packs/day: 1.00    Years: 40.00    Pack years: 40.00  . Smokeless tobacco: Never Used  Substance Use Topics  . Alcohol use: Yes    Alcohol/week: 0.0 standard drinks    Comment: 5-6 cocktails, 1 times a week  . Drug use: No    Review of Systems Constitutional: No fever/chills Eyes: No visual changes. ENT: No sore throat. Cardiovascular: Denies chest pain. Respiratory: Positive for shortness of breath. Gastrointestinal: No abdominal pain.  No nausea, no vomiting.  No diarrhea.   Genitourinary: Negative for dysuria. Musculoskeletal: Negative for back pain. Skin: Negative for rash. Neurological: Positive for syncopal episode.  ____________________________________________   PHYSICAL EXAM:  VITAL  SIGNS: ED Triage Vitals  Enc Vitals Group     BP 08/09/19 1440 (!) 142/48     Pulse Rate 08/09/19 1440 77     Resp 08/09/19 1440 17     Temp 08/09/19 1440 98.3 F (36.8 C)     Temp Source 08/09/19 1440 Oral     SpO2 08/09/19 1440 92 %     Weight 08/09/19 1435 275 lb (124.7 kg)     Height 08/09/19 1435 5\' 9"  (1.753 m)     Head Circumference --      Peak Flow --      Pain Score 08/09/19 1435 0   Constitutional: Alert and oriented.  Eyes: Conjunctivae are normal.  ENT      Head: Normocephalic and atraumatic.      Nose: No congestion/rhinnorhea.      Mouth/Throat: Mucous membranes are moist.      Neck: No stridor. Hematological/Lymphatic/Immunilogical: No cervical lymphadenopathy. Cardiovascular: Normal rate, regular rhythm.  No murmurs, rubs, or gallops.  Respiratory: Normal respiratory effort without tachypnea nor retractions. Breath sounds are clear and equal bilaterally. No wheezes/rales/rhonchi. Gastrointestinal: Soft and non tender. No rebound. No guarding.  Genitourinary: Deferred Musculoskeletal: Normal range of motion in all extremities. Bilateral lower extremity edema. Neurologic:  Normal speech and language. No gross focal neurologic deficits are appreciated.  Skin:  Skin is warm, dry and intact. No rash noted. Psychiatric: Mood and affect are normal. Speech and behavior are normal. Patient exhibits appropriate insight and judgment.  ____________________________________________    LABS (pertinent positives/negatives)  BNP 401.0 CMP na 137, k 4.5, glu 149, cr 1.57  ____________________________________________   EKG  I, Nance Pear, attending physician, personally viewed and interpreted this EKG  EKG Time: 1445 Rate: 77 Rhythm: normal sinus rhythm Axis: right superior axis deviation Intervals: qtc 407 QRS: narrow, q waves v1, v2 ST changes: no st elevation Impression: abnormal ekg  ____________________________________________     RADIOLOGY  CXR No acute abnormality  CT angio  No PE. Mild cardiomegaly.   ____________________________________________   PROCEDURES  Procedures  ____________________________________________   INITIAL IMPRESSION / ASSESSMENT AND PLAN / ED COURSE  Pertinent labs & imaging results that were available during my care of the patient were reviewed by me and considered in my medical decision making (see  chart for details).   Patient presented to the emergency department today with concerns for edema after a syncopal episode roughly 1 week ago.  States he has had 25 pound weight gain in the past week.  He is also noticed some shortness of breath with exertion.  On exam patient was found to be hypoxic on room air dropping down to 87 and 88.  CT angio was performed which did not show any pulmonary embolisms.  Troponin was minimally elevated BNP was elevated.  I do have concerns the patient has new onset heart failure.  Patient has history of heart attacks since possibly could have had one 1 week ago although troponins not significantly elevated at this time.  I did recommend admission to the patient for further work-up of his hypoxia.  He however declined admission.  I did discuss my concerns for hypoxia and potential heart disease and damage.  He states that he can and has been using his wife's oxygen at home over the past couple of days.  Did give my concern if he was to have any further episodes outside of the hospital environment.  Patient verbalized understanding however again declined admission.  Discussed with patient portance then of extremely close follow-up with primary care and cardiology.  Also discussed and invited patient to return to the hospital for any further episodes or concerns.  ____________________________________________   FINAL CLINICAL IMPRESSION(S) / ED DIAGNOSES  Final diagnoses:  Peripheral edema  Hypoxia     Note: This dictation was prepared with Dragon  dictation. Any transcriptional errors that result from this process are unintentional     Nance Pear, MD 08/09/19 1831

## 2019-08-09 NOTE — ED Triage Notes (Signed)
Pt reports swelling has gotten so bad that he is now swelling in his groin area.

## 2019-08-09 NOTE — ED Notes (Signed)
ED Provider at bedside. 

## 2019-08-09 NOTE — ED Notes (Signed)
Pt placed on 2 liters oxygen via [place].  Oxygen sats improved.

## 2019-08-09 NOTE — ED Notes (Signed)
Pt sent to er from dr [facility] office today with swelling of legs and sob. Sx for 1 week.  Worse today.  cig smoker  Denies chest pain.  No n/v/  Pt ambulates without diff.  Iv started and labs sent.  md at bedside.

## 2019-08-09 NOTE — ED Triage Notes (Signed)
Pt reports wakness, SOB and edema from his waist down for the past couple of weeks. Pt reports gained 25lbs over the last 2 weeks. Pt states he was here last Tuesday but left before being seen. Pt states he followed up with Dr. [name] and was advised to come to the ED.

## 2019-08-09 NOTE — Discharge Instructions (Signed)
It is important that you return if you have any further symptoms, shortness of breath, dizziness, chest pain. It is extremely important that you follow up closely with your primary care doctor and cardiologist.

## 2019-08-10 ENCOUNTER — Other Ambulatory Visit (HOSPITAL_COMMUNITY): Payer: Self-pay | Admitting: Internal Medicine

## 2019-08-10 ENCOUNTER — Other Ambulatory Visit: Payer: Self-pay | Admitting: Internal Medicine

## 2019-08-10 ENCOUNTER — Ambulatory Visit
Admission: RE | Admit: 2019-08-10 | Discharge: 2019-08-10 | Disposition: A | Discharge status: Home / Self Care | Payer: Medicare Other | Source: Ambulatory Visit | Attending: Internal Medicine | Admitting: Internal Medicine

## 2019-08-10 DIAGNOSIS — R55 Syncope and collapse: Secondary | ICD-10-CM | POA: Insufficient documentation

## 2019-08-10 DIAGNOSIS — R188 Other ascites: Secondary | ICD-10-CM

## 2019-08-11 ENCOUNTER — Other Ambulatory Visit: Payer: Self-pay

## 2019-08-11 ENCOUNTER — Encounter: Payer: Self-pay | Admitting: Nurse Practitioner

## 2019-08-11 ENCOUNTER — Ambulatory Visit (INDEPENDENT_AMBULATORY_CARE_PROVIDER_SITE_OTHER): Payer: Medicare Other

## 2019-08-11 ENCOUNTER — Ambulatory Visit (INDEPENDENT_AMBULATORY_CARE_PROVIDER_SITE_OTHER): Payer: Medicare Other | Admitting: Nurse Practitioner

## 2019-08-11 VITALS — BP 110/68 | HR 77 | Ht 69.0 in | Wt 270.2 lb

## 2019-08-11 DIAGNOSIS — R55 Syncope and collapse: Secondary | ICD-10-CM

## 2019-08-11 DIAGNOSIS — I2510 Atherosclerotic heart disease of native coronary artery without angina pectoris: Secondary | ICD-10-CM | POA: Diagnosis not present

## 2019-08-11 DIAGNOSIS — I10 Essential (primary) hypertension: Secondary | ICD-10-CM | POA: Diagnosis not present

## 2019-08-11 DIAGNOSIS — E785 Hyperlipidemia, unspecified: Secondary | ICD-10-CM

## 2019-08-11 DIAGNOSIS — I509 Heart failure, unspecified: Secondary | ICD-10-CM

## 2019-08-11 DIAGNOSIS — Z720 Tobacco use: Secondary | ICD-10-CM

## 2019-08-11 DIAGNOSIS — N179 Acute kidney failure, unspecified: Secondary | ICD-10-CM

## 2019-08-11 LAB — NOVEL CORONAVIRUS, NAA (HOSP ORDER, SEND-OUT TO REF LAB; TAT 18-24 HRS): SARS-CoV-2, NAA: NOT DETECTED

## 2019-08-11 NOTE — Progress Notes (Signed)
Office Visit    Patient Name: Joshua Roy Date of Encounter: 08/11/2019  Primary Care Provider:  [name], MD Primary Cardiologist:  Ida Rogue, MD  Chief Complaint    69 year old male with a history of CAD, hyperlipidemia, tobacco abuse, COPD, obesity, and diabetes, who presents for follow-up after recent ER visit secondary to CHF and syncope.  Past Medical History    Past Medical History:  Diagnosis Date  . ACE-inhibitor cough   . Anxiety   . Bronchitis 06/27/2016  . COPD (chronic obstructive pulmonary disease) (HCC)    cath, mild inf. hypokinesis EF 49%, 100% ROA  . Coronary artery disease 2001   a. 2001 Cath: LM 10, LAD 60p/m, D1 30, LCX 20p, RCA 151m/d; b. 05/2017 Ex Mv: Ex time 5:46, antlat twi @ rest, fixed infarct w/ mild peri-infarct ischemia.  . Diabetes mellitus type II   . Hyperlipidemia   . Hypertension   . MI (myocardial infarction) ([place]) age 69  . OA (osteoarthritis)    knee OA, injected 2012 by ortho  . Pneumonia    Past Surgical History:  Procedure Laterality Date  . CARDIAC CATHETERIZATION  05/06/2000   @ [place]  . COLONOSCOPY WITH PROPOFOL N/A 04/20/2018   Procedure: COLONOSCOPY WITH PROPOFOL;  Surgeon: [name], MD;  Location: [facility] ENDOSCOPY;  Service: Endoscopy;  Laterality: N/A;  . ESOPHAGOGASTRODUODENOSCOPY ENDOSCOPY    . KNEE ARTHROSCOPY  07/25/04   left  . [place]   right, open surgery- repair  . 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 TOTAL KNEE ARTHROPLASTY Right 07/13/2017   Procedure: RIGHT TOTAL KNEE ARTHROPLASTY;  Surgeon: Gaynelle Arabian, MD;  Location: [facility] ORS;  Service: Orthopedics;  Laterality: Right;    Allergies  Allergies  Allergen Reactions  . Mucinex D [Pseudoephedrine-Guaifenesin Er]     Elevated BP, insomnia  . Ramipril     REACTION: Cough    History  of Present Illness    69 y/o ? with a h/o CAD s/p cath in 2001 revealing an occluded mid-dist RCA w/ otw moderate CAD.  Other hx includes HTN, HL, DMII, tob abuse, obesity, and COPD.  He was last evaluated by Dr. Rockey Situ in May 2018, and subsequently underwent stress testing in 05/2017 in the setting of preop clearance.  This showed prior inferior scar w/ mild peri-infarct ischemia.  He says that he had done well since then - remaining active around his property w/o symptoms or limitations.    He was in his usoh on 1/5, when after watching some basketball and having a cocktail @ a friend's house, he got up to leave.  As he was walking down the steps outside of this person's house, he had sudden onset of dizziness and vertiginous symptoms, as though the room was spinning.  In that setting, he felt weak and fell to the ground without losing consciousness.  He says he was somewhat disoriented.  He called for his friend who came out and helped him to sit up.  He continued to feel very dizzy.  The friend called his wife who arrived at his friend's house several minutes later.  Patient was then helped into his wife's car and continued to feel disoriented but no longer dizzy.  His wife apparently witnessed him briefly lose consciousness.  She took him to the [facility] ED.  ECG was nonacute.  His creatinine was noted to be elevated above prior baseline at 1.75.  H&H were normal.  High-sensitivity troponin was 13.  Patient says that he was waiting in the waiting area for about 3 hours and decided to leave since he felt better.  Since that day, he has noted progressive weight gain (25 pounds), along with increasing dyspnea on exertion, orthopnea, and lower extremity and scrotal edema.  He saw his primary care provider late last week and had some labs drawn and when he was seen back earlier this week on January 12, he says he was more volume overloaded and referred to the emergency room.  There, he was hypoxic with  saturations into the 80s on room air.  His BNP was 401.  High-sensitivity troponin was 28.  H&H were stable.  Creatinine was slightly better at 1.57 with a BUN of 27.  His chest x-ray showed no acute disease.  CT angio of the chest was negative for PE and showed mild cardiomegaly with coronary vascular calcification and aortic atherosclerosis.  A trace right pleural effusion was noted but no significant CHF.  Admission was recommended, however patient declined and was subsequently discharged from the ED.  He has since been on Lasix 40 mg daily and has had an 8 pound weight loss in the past 48 hours.  He is scheduled for follow-up lab work next Tuesday with his primary care provider.  Despite progressive dyspnea, orthopnea, lower extremity edema, he has not experienced any chest pain or pressure.  He denies palpitations or early satiety.  He has been wearing his wife's oxygen at night.  Home Medications    Prior to Admission medications   Medication Sig Start Date End Date Taking? Authorizing Provider  acetaminophen (TYLENOL) 500 MG tablet Take 1,000 mg by mouth every 8 (eight) hours as needed for mild pain or headache.   Yes [provider]  ALPRAZolam (XANAX) 0.25 MG tablet Take 0.25 mg by mouth 2 (two) times daily as needed.  08/03/19  Yes [provider]  atorvastatin (LIPITOR) 80 MG tablet Take 1 tablet (80 mg total) by mouth daily. 11/21/14  Yes Tonia Ghent, MD  clopidogrel (PLAVIX) 75 MG tablet Take 75 mg by mouth daily.   Yes [provider]  fenofibrate 160 MG tablet Take 1 tablet (160 mg total) by mouth daily. 01/11/15  Yes Tonia Ghent, MD  FUROSEMIDE PO Take 40 mg by mouth daily.    Yes [provider]  glimepiride (AMARYL) 2 MG tablet Take one half tablet (1 mg) by mouth each morning Patient taking differently: Take 1 mg by mouth daily with breakfast.  01/11/15  Yes Tonia Ghent, MD  glucose blood (ACCU-CHEK AVIVA PLUS) test strip Use to check  blood sugar once daily or as needed.  Diagnosis:  E11.9   Non insulin-dependent. 12/14/14  Yes Tonia Ghent, MD  Lancets (ACCU-CHEK MULTICLIX) lancets Use as instructed to check blood sugar once daily or as needed.  Diagnosis:  E11.9   Non insulin-dependent. 12/14/14  Yes Tonia Ghent, MD  losartan (COZAAR) 25 MG tablet Take 1 tablet (25 mg total) by mouth daily. 01/11/15  Yes Tonia Ghent, MD  metFORMIN (GLUCOPHAGE) 1000 MG tablet Take 1 tablet (1,000 mg total) by mouth 2 (two) times daily. 01/11/15  Yes Tonia Ghent, MD  metoprolol tartrate (LOPRESSOR) 25 MG tablet Take 1 tablet (25 mg total) by mouth 2 (two) times daily. 01/11/15  Yes Tonia Ghent, MD  nicotine (NICODERM CQ - DOSED IN MG/24 HOURS) 21 mg/24hr patch Place 21 mg onto the skin daily.   Yes [provider]  pantoprazole (PROTONIX) 40 MG tablet Take 1 tablet (40 mg total) by mouth daily. 02/13/15  Yes Tonia Ghent, MD  POTASSIUM CHLORIDE ER PO Take 20 mEq by mouth daily.    Yes [provider]  gabapentin (NEURONTIN) 300 MG capsule Take 1 capsule (300 mg total) by mouth 3 (three) times daily. Gabapentin 300 mg Protocol Take a 300 mg capsule three times a day for one week, Then a 300 mg capsule twice a day for one week, Then a 300 mg capsule once a day for one week, then discontinue the Gabapentin. Patient not taking: Reported on 04/20/2018 07/14/17   Dara Lords, Alexzandrew L, PA-C  methocarbamol (ROBAXIN) 500 MG tablet Take 1 tablet (500 mg total) by mouth every 6 (six) hours as needed for muscle spasms. Patient not taking: Reported on 04/20/2018 07/14/17   Dara Lords, Alexzandrew L, PA-C  oxyCODONE (OXY IR/ROXICODONE) 5 MG immediate release tablet Take 1-2 tablets (5-10 mg total) by mouth every 4 (four) hours as needed for moderate pain or severe pain. Patient not taking: Reported on 04/20/2018 07/14/17   Dara Lords, Alexzandrew L, PA-C  traMADol (ULTRAM) 50 MG tablet Take 1-2 tablets (50-100 mg total) by  mouth every 6 (six) hours as needed (mild pain). Patient not taking: Reported on 04/20/2018 07/14/17   Joelene Millin, PA-C    Review of Systems    Over the past 2 weeks, he has had increasing lower extremity swelling, increasing abdominal girth, scrotal edema, 25 pound weight gain, progressive dyspnea, and orthopnea.  Symptoms have improved some with initiation of Lasix therapy.  He denies chest pain, palpitations, or early satiety.  All other systems reviewed and are otherwise negative except as noted above.  Physical Exam    VS:  BP 110/68 (BP Location: Left Arm, Patient Position: Sitting, Cuff Size: Large)   Pulse 77   Ht 5\' 9"  (1.753 m)   Wt 270 lb 4 oz (122.6 kg)   SpO2 90% Comment: uses O2 at night  BMI 39.91 kg/m  , BMI Body mass index is 39.91 kg/m. GEN: Well nourished, well developed, in no acute distress. HEENT: normal. Neck: Supple, moderately elevated JVP, no carotid bruits, or masses. Cardiac: RRR, 2/6 systolic murmur heard throughout, no rubs, or gallops. No clubbing, cyanosis.  2+ bilateral lower extremity edema to the lower thighs.  Radials/PT 2+ and equal bilaterally.  Respiratory:  Respirations regular and unlabored, clear to auscultation bilaterally. GI: Obese, semifirm, nontender, BS + x 4. MS: no deformity or atrophy. Skin: warm and dry, no rash. Neuro:  Strength and sensation are intact. Psych: Normal affect.  Accessory Clinical Findings    ECG personally reviewed by me today -regular sinus rhythm, 77, right axis deviation, septal infarct, inferolateral T wave inversion-compared to ECGs from 2018 and before, septal infarct appears new.  Lab Results  Component Value Date   WBC 8.3 08/09/2019   HGB 13.6 08/09/2019   HCT 45.3 08/09/2019   MCV 78.1 (L) 08/09/2019   PLT 295 08/09/2019   Lab Results  Component Value Date   CREATININE 1.57 (H) 08/09/2019   BUN 27 (H) 08/09/2019   NA 137 08/09/2019   K 4.5 08/09/2019   CL 94 (L) 08/09/2019   CO2  33 (H) 08/09/2019   Lab Results  Component Value Date   ALT 23 08/09/2019   AST 26 08/09/2019  ALKPHOS 28 (L) 08/09/2019   BILITOT 1.0 08/09/2019   Lab Results  Component Value Date   CHOL 155 11/15/2014   HDL 35.00 (L) 11/15/2014   LDLCALC 93 09/26/2013   LDLDIRECT 109.0 11/15/2014   TRIG 204.0 (H) 11/15/2014   CHOLHDL 4 11/15/2014    Lab Results  Component Value Date   HGBA1C 7.4 (H) 07/08/2017    Assessment & Plan    1.  Acute congestive heart failure: Patient without any significant prior history of clinical heart failure.  He had normal LV function by SPECT on stress testing October 2012.  More recent stress test in 2018 showed an EF of 38%, though patient was not exhibiting any signs or symptoms of heart failure at that time.  He has not had an echo in our system.  Approximately 2 weeks ago he had sudden onset of dizziness and vertiginous-like symptoms followed by what sounds like a brief syncopal episode and since then, he has had progressive dyspnea, orthopnea, 25 pound weight gain, lower extremity swelling, increasing abdominal girth, and scrotal edema.  He was recently evaluated in the ED due to volume overload and chest x-ray and CTA of the chest did not show significant CHF however, his BNP was elevated at 401.  He has since been placed on Lasix 40 mg daily and is down 8 pounds in the past 48 hours.  With weight loss, he has noticed some improvement in symptoms.  That said, he remains volume overloaded today.  As he has responded exceptionally well to his current dose of Lasix, I will not adjust this today.  I will arrange for a 2D echocardiogram to reevaluate LV function within the next few days and he will likely require right and left heart cardiac catheterization, as I am concerned about the relatively abrupt onset of his symptoms and Q waves in V1 and V2.  At this point, he remains orthopneic and so will require more diuresis prior to considering catheterization.  We will  also need to assure that renal function is stable with diuresis.  Continue beta-blocker and ARB therapy.  He is due to have follow-up labs with his primary care provider next Tuesday.  We will see him back in the next 10 days to discuss echo and next steps.  2.  Coronary artery disease: Status post prior catheterization in 2001 revealing a total occlusion of the RCA with 6% LAD stenosis and otherwise mild nonobstructive disease.  Though he has not had any chest pain, abrupt onset of heart failure symptoms is concerning for an ischemic event however, high-sensitivity troponin in the ED was only minimally elevated at 28.  As noted above, ECG notable for septal infarct which is different from May 2018 and prior.  Diurese as above.  Follow-up echocardiogram.  Continue Plavix, beta-blocker, ARB, and statin therapy.  We will plan to see him within the next 7 to 10 days after his echo provided renal function stable, we will likely need to pursue right and left heart cardiac catheterization.  3.  Essential hypertension: Stable on beta-blocker and ARB.  4.  Hyperlipidemia: On statin therapy and followed by primary care.  5.  Acute kidney injury: Recent creatinine in the ED on January 5 of 1.75 and then 1.57 on January 12.  Historically, his GFR has been greater than 60.  Certainly have some concern for potential low output in the setting of heart failure symptoms.  He is scheduled to have follow-up lab work next Tuesday with his  primary care provider after being initiated on Lasix therapy.  6.  Tobacco abuse/COPD: Continues to smoke.  He says he is recently been using his wife's oxygen at night due to orthopnea.  He has also been sleeping in recliner.  Pulse ox was 90% at rest on room air today.  Complete smoking cessation advised.    7.  Syncope: As noted above, on January 5, patient had presyncope and dizziness with what sounds like vertiginous type symptoms.  He was not experiencing chest pain, dyspnea,  diaphoresis, or nausea at that time.  After being helped into his wife's car, he said he was no longer having vertiginous symptoms but then apparently briefly lost consciousness.  He says that his wife told him he was mumbling but he has no recollection of this.  In the ED, his ECG shows sinus rhythm but at that point, he was feeling better.  Though I do not think that symptoms that evening were primarily arrhythmic, given heart failure symptoms since and concern for potential LV dysfunction, I am going to place a 2-week ZIO monitor to rule out arrhythmias.  8.  DMII:  Per IM.  9.  Disposition: Follow-up echo in [place] monitor.  Follow-up in clinic in the next 7 to 10 days.   Murray Hodgkins, NP 08/11/2019, 12:39 PM

## 2019-08-11 NOTE — Patient Instructions (Signed)
Medication Instructions:  No changes *If you need a refill on your cardiac medications before your next appointment, please call your pharmacy*  Lab Work: None ordered  If you have labs (blood work) drawn today and your tests are completely normal, you will receive your results only by: Marland Kitchen MyChart Message (if you have MyChart) OR . A paper copy in the mail If you have any lab test that is abnormal or we need to change your treatment, we will call you to review the results.  Testing/Procedures: 1- Echo  Please return to [facility] on ______________ at _______________ AM/PM for an Echocardiogram. Your physician has requested that you have an echocardiogram. Echocardiography is a painless test that uses sound waves to create images of your heart. It provides your doctor with information about the size and shape of your heart and how well your heart's chambers and valves are working. This procedure takes approximately one hour. There are no restrictions for this procedure. Please note; depending on visual quality an IV may need to be placed.   2-  A zio monitor was placed today. It will remain on for 14 days. You will then return monitor and event diary in provided box. It takes 1-2 weeks for report to be downloaded and returned to [place]. We will call you with the results. If monitor falls of or has orange flashing light, please call Zio for further instructions.     Follow-Up: At [facility], you and your health needs are our priority.  As part of our continuing mission to provide you with exceptional heart care, we have created designated Provider Care Teams.  These Care Teams include your primary Cardiologist (physician) and Advanced Practice Providers (APPs -  Physician Assistants and Nurse Practitioners) who all work together to provide you with the care you need, when you need it.  Your next appointment:   2 week(s)  The format for your next appointment:   In  Person  Provider:    You may see Ida Rogue, MD or Murray Hodgkins, NP.

## 2019-08-12 DIAGNOSIS — R55 Syncope and collapse: Secondary | ICD-10-CM | POA: Diagnosis not present

## 2019-08-16 ENCOUNTER — Ambulatory Visit
Admission: RE | Admit: 2019-08-16 | Discharge: 2019-08-16 | Disposition: A | Discharge status: Home / Self Care | Payer: Medicare Other | Source: Ambulatory Visit | Attending: Internal Medicine | Admitting: Internal Medicine

## 2019-08-16 ENCOUNTER — Other Ambulatory Visit: Payer: Self-pay

## 2019-08-16 DIAGNOSIS — R188 Other ascites: Secondary | ICD-10-CM | POA: Diagnosis not present

## 2019-08-24 ENCOUNTER — Telehealth: Payer: Self-pay | Admitting: Cardiovascular Disease

## 2019-08-24 NOTE — Telephone Encounter (Signed)
Patient wants to verify when to remove and mail zio.  Advised while waiting on call back he can call # on box to ask zio as wel.

## 2019-08-25 ENCOUNTER — Other Ambulatory Visit: Payer: Self-pay

## 2019-08-25 ENCOUNTER — Ambulatory Visit (INDEPENDENT_AMBULATORY_CARE_PROVIDER_SITE_OTHER): Payer: Medicare Other

## 2019-08-25 DIAGNOSIS — I509 Heart failure, unspecified: Secondary | ICD-10-CM | POA: Diagnosis not present

## 2019-08-25 MED ORDER — PERFLUTREN LIPID MICROSPHERE
1.0000 mL | INTRAVENOUS | Status: AC | PRN
  Administered 2019-08-25: 2 mL via INTRAVENOUS

## 2019-08-25 NOTE — Telephone Encounter (Signed)
No answer/Voicemail box is full. If he should call back he is to remove monitor today and mail back.

## 2019-08-25 NOTE — Telephone Encounter (Signed)
Patient here for echocardiogram and he removed monitor and placed in mail prior to coming in. He was appreciative for the information with no further questions.

## 2019-08-29 ENCOUNTER — Telehealth: Payer: Self-pay

## 2019-08-29 NOTE — Telephone Encounter (Signed)
Call to patient to review echo results.    Pt verbalized understanding and had no further questions at this time.   No new orders. confirmed appt later this week.   Advised pt to call for any further questions or concerns.

## 2019-08-29 NOTE — Telephone Encounter (Signed)
-----   Message from Theora Gianotti, NP sent at 08/26/2019  7:58 AM EST ----- Normal heart squeezing function, which is very good news. The heart is mildly stiff, which is common, and so we just focus on HR/BP control in that setting (both were fine at office visit).  Keep f/u next week and we will discuss next steps.

## 2019-08-31 ENCOUNTER — Encounter: Payer: Self-pay | Admitting: Nurse Practitioner

## 2019-08-31 ENCOUNTER — Ambulatory Visit (INDEPENDENT_AMBULATORY_CARE_PROVIDER_SITE_OTHER): Payer: Medicare Other | Admitting: Nurse Practitioner

## 2019-08-31 ENCOUNTER — Other Ambulatory Visit: Payer: Self-pay

## 2019-08-31 VITALS — BP 120/62 | HR 73 | Ht 70.0 in | Wt 253.2 lb

## 2019-08-31 DIAGNOSIS — N179 Acute kidney failure, unspecified: Secondary | ICD-10-CM | POA: Diagnosis not present

## 2019-08-31 DIAGNOSIS — I2510 Atherosclerotic heart disease of native coronary artery without angina pectoris: Secondary | ICD-10-CM | POA: Diagnosis not present

## 2019-08-31 DIAGNOSIS — I25119 Atherosclerotic heart disease of native coronary artery with unspecified angina pectoris: Secondary | ICD-10-CM | POA: Diagnosis not present

## 2019-08-31 DIAGNOSIS — R55 Syncope and collapse: Secondary | ICD-10-CM | POA: Diagnosis not present

## 2019-08-31 DIAGNOSIS — I739 Peripheral vascular disease, unspecified: Secondary | ICD-10-CM | POA: Diagnosis not present

## 2019-08-31 DIAGNOSIS — I5033 Acute on chronic diastolic (congestive) heart failure: Secondary | ICD-10-CM | POA: Diagnosis not present

## 2019-08-31 DIAGNOSIS — I10 Essential (primary) hypertension: Secondary | ICD-10-CM

## 2019-08-31 DIAGNOSIS — I509 Heart failure, unspecified: Secondary | ICD-10-CM | POA: Diagnosis not present

## 2019-08-31 DIAGNOSIS — E785 Hyperlipidemia, unspecified: Secondary | ICD-10-CM

## 2019-08-31 NOTE — Progress Notes (Signed)
Cardiology Clinic Note   Patient Name: Joshua Roy Date of Encounter: 08/31/2019  Primary Care Provider:  [name], MD Primary Cardiologist:  Ida Rogue, MD  Patient Profile    69 year old male with a history of CAD, hyperlipidemia, tobacco abuse, COPD, obesity, and diabetes, who presents for follow-up secondary to CHF.  Past Medical History    Past Medical History:  Diagnosis Date  . (HFpEF) heart failure with preserved ejection fraction ([facility])    a. 07/2019 Echo: EF 60-65%. Mod LVH. Gr1 DD. No rwma. Nl RV size/fxn. Mildly dil LA.  Marland Kitchen ACE-inhibitor cough   . Anxiety   . Bronchitis 06/27/2016  . COPD (chronic obstructive pulmonary disease) (H[facility])    cath, mild inf. hypokinesis EF 49%, 100% ROA  . Coronary artery disease 2001   a. 2001 Cath: LM 10, LAD 60p/m, D1 30, LCX 20p, RCA 173m/d; b. 05/2017 Ex Mv: Ex time 5:46, antlat twi @ rest, fixed infarct w/ mild peri-infarct ischemia. EF 38%.  . Diabetes mellitus type II   . Hyperlipidemia   . Hypertension   . MI (myocardial infarction) ([place]) age 10  . OA (osteoarthritis)    knee OA, injected 2012 by ortho  . Pneumonia    Past Surgical History:  Procedure Laterality Date  . CARDIAC CATHETERIZATION  05/06/2000   @ [place]  . COLONOSCOPY WITH PROPOFOL N/A 04/20/2018   Procedure: COLONOSCOPY WITH PROPOFOL;  Surgeon: [name], MD;  Location: [facility] ENDOSCOPY;  Service: Endoscopy;  Laterality: N/A;  . ESOPHAGOGASTRODUODENOSCOPY ENDOSCOPY    . KNEE ARTHROSCOPY  07/25/04   left  . [place]   right, open surgery- repair  . 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 TOTAL KNEE ARTHROPLASTY Right 07/13/2017   Procedure: RIGHT TOTAL KNEE ARTHROPLASTY;  Surgeon: Gaynelle Arabian, MD;  Location: [facility] ORS;  Service: Orthopedics;  Laterality: Right;    Allergies  Allergies  Allergen  Reactions  . Mucinex D [Pseudoephedrine-Guaifenesin Er]     Elevated BP, insomnia  . Ramipril     REACTION: Cough    History of Present Illness    69 year old male with a history of CAD status post catheterization in 2001 revealing an occluded mid-distal RCA with otherwise moderate CAD.  Other history includes hypertension, hyperlipidemia, type 2 diabetes mellitus, obesity, tobacco abuse, and COPD.  He most recently underwent stress testing in November 2018 in the setting of preoperative evaluation, and this showed prior inferior scar with mild peri-infarct ischemia.    He was in his usual state of health on January 5, when after watching some basketball and having a cocktail at a friend's house, he had sudden onset of dizziness and vertiginous symptoms, followed by weakness and fall.  He did not initially lose consciousness but noted that he was disoriented.  His friend called the patient's wife who arrived several minutes later to pick him up.  Once he was in her car, he was witnessed to briefly lose consciousness.  He remained disoriented and was taken to the [facility] ED.  There, creatinine was elevated above prior baseline at 1.75.  H&H were normal.  High-sensitivity troponin was 13.  After waiting in the waiting room for about 3 hours, he decided to leave since he felt better.  Following that day, he noted progressive weight gain (25 pounds), along with increasing dyspnea exertion, orthopnea, and lower extremity  and scrotal edema.  At a follow-up with primary care, he was noted to be volume overloaded and referred to the emergency department on January 12.  There, he was hypoxic with saturations in the 80s on room air.  BNP was 401.  High-sensitivity troponin was 28.  H&H were normal.  Creatinine was slightly improved at 1.57 with a BUN of 27.  Chest x-ray showed no acute disease.  CT angio of the chest was negative for PE and showed mild cardiomegaly with coronary vascular calcification and aortic  atherosclerosis.  Trace right pleural effusion was noted but no significant CHF.  Admission was recommended however patient declined and was subsequently discharged from the ED.  His primary care provider placed him on Lasix 40 mg daily.  I saw him on January 14, at which time his weight had come down 8 pounds over 48-hour, though he remained volume overloaded.  I set him up for an echocardiogram, which showed normal LV function and grade 1 diastolic dysfunction.  No significant valvular disease was noted.  Since his last visit, his weight is down 27 pounds.  With this, he has noted improvement in dyspnea on exertion and lower extremity swelling, though neither are back to previous baseline.  He continues to experience dyspnea on exertion after walking about 50 to 100 yards.  He has not experienced any chest pain.  He has noted significant improvement in abdominal girth and scrotal edema.  He now reports bilateral lower extremity/calf heaviness and claudication after walking about 25 yards.  He did not mention this at his last visit but says it has been more noticeable as he has been able to increase his activity some.  He denies palpitations, PND, orthopnea, dizziness, syncope, or early satiety.  Of note, he did complete his ZIO monitor and mailed it back last week however no results have been made available for our review at this point.  He did not have any symptoms during his monitoring period.  Home Medications    Prior to Admission medications   Medication Sig Start Date End Date Taking? Authorizing Provider  acetaminophen (TYLENOL) 500 MG tablet Take 1,000 mg by mouth every 8 (eight) hours as needed for mild pain or headache.    [provider]  ALPRAZolam Duanne Moron) 0.25 MG tablet Take 0.25 mg by mouth 2 (two) times daily as needed.  08/03/19   [provider]  atorvastatin (LIPITOR) 80 MG tablet Take 1 tablet (80 mg total) by mouth daily. 11/21/14   Tonia Ghent, MD  clopidogrel  (PLAVIX) 75 MG tablet Take 75 mg by mouth daily.    [provider]  fenofibrate 160 MG tablet Take 1 tablet (160 mg total) by mouth daily. 01/11/15   Tonia Ghent, MD  FUROSEMIDE PO Take 40 mg by mouth daily.     [provider]  gabapentin (NEURONTIN) 300 MG capsule Take 1 capsule (300 mg total) by mouth 3 (three) times daily. Gabapentin 300 mg Protocol Take a 300 mg capsule three times a day for one week, Then a 300 mg capsule twice a day for one week, Then a 300 mg capsule once a day for one week, then discontinue the Gabapentin. Patient not taking: Reported on 04/20/2018 07/14/17   Dara Lords, Alexzandrew L, PA-C  glimepiride (AMARYL) 2 MG tablet Take one half tablet (1 mg) by mouth each morning Patient taking differently: Take 1 mg by mouth daily with breakfast.  01/11/15   Tonia Ghent, MD  glucose  blood (ACCU-CHEK AVIVA PLUS) test strip Use to check blood sugar once daily or as needed.  Diagnosis:  E11.9   Non insulin-dependent. 12/14/14   Tonia Ghent, MD  Lancets (ACCU-CHEK MULTICLIX) lancets Use as instructed to check blood sugar once daily or as needed.  Diagnosis:  E11.9   Non insulin-dependent. 12/14/14   Tonia Ghent, MD  losartan (COZAAR) 25 MG tablet Take 1 tablet (25 mg total) by mouth daily. 01/11/15   Tonia Ghent, MD  metFORMIN (GLUCOPHAGE) 1000 MG tablet Take 1 tablet (1,000 mg total) by mouth 2 (two) times daily. 01/11/15   Tonia Ghent, MD  methocarbamol (ROBAXIN) 500 MG tablet Take 1 tablet (500 mg total) by mouth every 6 (six) hours as needed for muscle spasms. Patient not taking: Reported on 04/20/2018 07/14/17   Dara Lords, Alexzandrew L, PA-C  metoprolol tartrate (LOPRESSOR) 25 MG tablet Take 1 tablet (25 mg total) by mouth 2 (two) times daily. 01/11/15   Tonia Ghent, MD  nicotine (NICODERM CQ - DOSED IN MG/24 HOURS) 21 mg/24hr patch Place 21 mg onto the skin daily.    [provider]  oxyCODONE (OXY IR/ROXICODONE) 5 MG immediate  release tablet Take 1-2 tablets (5-10 mg total) by mouth every 4 (four) hours as needed for moderate pain or severe pain. Patient not taking: Reported on 04/20/2018 07/14/17   Dara Lords, Alexzandrew L, PA-C  pantoprazole (PROTONIX) 40 MG tablet Take 1 tablet (40 mg total) by mouth daily. 02/13/15   Tonia Ghent, MD  POTASSIUM CHLORIDE ER PO Take 20 mEq by mouth daily.     [provider]  traMADol (ULTRAM) 50 MG tablet Take 1-2 tablets (50-100 mg total) by mouth every 6 (six) hours as needed (mild pain). Patient not taking: Reported on 04/20/2018 07/14/17   Joelene Millin, PA-C    Family History    Family History  Problem Relation Age of Onset  . Heart disease Father        CAD, MI x 3  . Hypertension Father   . Alcohol abuse Father   . Diabetes Father   . Diabetes Sister   . Cancer Brother        lymphoma, in remission  . Colon cancer Brother   . Heart disease Sister        CABG x 3  . Prostate cancer Neg Hx    He indicated that his mother is deceased. He indicated that his father is deceased. He indicated that only one of his two sisters is alive. He indicated that his brother is alive. He indicated that the status of his neg hx is unknown.  Social History    Social History   Socioeconomic History  . Marital status: Married    Spouse name: Not on file  . Number of children: 3  . Years of education: Not on file  . Highest education level: Not on file  Occupational History  . Occupation: Engineer, manufacturing, Estate agent  Tobacco Use  . Smoking status: Current Every Day Smoker    Packs/day: 1.00    Years: 40.00    Pack years: 40.00  . Smokeless tobacco: Never Used  Substance and Sexual Activity  . Alcohol use: Yes    Alcohol/week: 0.0 standard drinks    Comment: 5-6 cocktails, 1 times a week  . Drug use: No  . Sexual activity: Not on file  Other Topics Concern  . Not on file  Social History Narrative  From [place].  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   Social Determinants of Health   Financial Resource Strain:   . Difficulty of Paying Living Expenses: Not on file  Food Insecurity:   . Worried About Charity fundraiser in the Last Year: Not on file  . Ran Out of Food in the Last Year: Not on file  Transportation Needs:   . Lack of Transportation (Medical): Not on file  . Lack of Transportation (Non-Medical): Not on file  Physical Activity:   . Days of Exercise per Week: Not on file  . Minutes of Exercise per Session: Not on file  Stress:   . Feeling of Stress : Not on file  Social Connections:   . Frequency of Communication with Friends and Family: Not on file  . Frequency of Social Gatherings with Friends and Family: Not on file  . Attends Religious Services: Not on file  . Active Member of Clubs or Organizations: Not on file  . Attends Archivist Meetings: Not on file  . Marital Status: Not on file  Intimate Partner Violence:   . Fear of Current or Ex-Partner: Not on file  . Emotionally Abused: Not on file  . Physically Abused: Not on file  . Sexually Abused: Not on file     Review of Systems    General:  No chills, fever, night sweats or weight changes.  Cardiovascular:  No chest pain, +++ dyspnea on exertion, +++ LE edema, resolved orthopnea, no palpitations, paroxysmal nocturnal dyspnea.  +++ bilat calf claudication. Dermatological: No rash, lesions/masses Respiratory: No cough, +++ dyspnea Urologic: No hematuria, dysuria Abdominal:   No nausea, vomiting, diarrhea, bright red blood per rectum, melena, or hematemesis Neurologic:  No visual changes, wkns, changes in mental status. All other systems reviewed and are otherwise negative except as noted above.  Physical Exam    VS:  BP 120/62 (BP Location: Left Arm, Patient Position: Sitting, Cuff Size: Normal)   Pulse 73   Ht 5\' 10"  (1.778 m)   Wt 253 lb 4 oz (114.9 kg)   SpO2 93%   BMI 36.34 kg/m  , BMI Body mass index is 36.34  kg/m. GEN: Well nourished, well developed, in no acute distress. HEENT: normal. Neck: Supple, no JVD, carotid bruits, or masses. Cardiac: RRR, no murmurs, rubs, or gallops. No clubbing, cyanosis.  1+ bilateral lower extremity edema.  Radials/PT 1+ and equal bilaterally.  Respiratory:  Respirations regular and unlabored, bibasilar crackles. GI: Obese, soft, nontender, nondistended, BS + x 4. MS: no deformity or atrophy. Skin: warm and dry, no rash. Neuro:  Strength and sensation are intact. Psych: Normal affect.  Accessory Clinical Findings    ECG personally reviewed by me today-regular sinus rhythm, 73, borderline first-degree AV block, right axis deviation prior septal infarct with inferior and anterolateral T wave inversion- No acute changes  Lab Results  Component Value Date   WBC 8.3 08/09/2019   HGB 13.6 08/09/2019   HCT 45.3 08/09/2019   MCV 78.1 (L) 08/09/2019   PLT 295 08/09/2019   Lab Results  Component Value Date   CREATININE 1.57 (H) 08/09/2019   BUN 27 (H) 08/09/2019   NA 137 08/09/2019   K 4.5 08/09/2019   CL 94 (L) 08/09/2019   CO2 33 (H) 08/09/2019   Lab Results  Component Value Date   ALT 23 08/09/2019   AST 26 08/09/2019   ALKPHOS 28 (L) 08/09/2019   BILITOT 1.0 08/09/2019  Lab Results  Component Value Date   CHOL 155 11/15/2014   HDL 35.00 (L) 11/15/2014   LDLCALC 93 09/26/2013   LDLDIRECT 109.0 11/15/2014   TRIG 204.0 (H) 11/15/2014   CHOLHDL 4 11/15/2014    Lab Results  Component Value Date   HGBA1C 7.4 (H) 07/08/2017    Assessment & Plan   1.  Acute diastolic congestive heart failure: Patient was evaluated on January 14 with complaints of the 25-30 pound weight gain associated with lower extremity edema, increasing abdominal girth, and scrotal edema.  At the time of his last visit, he had been on Lasix 40 mg daily for 2 days and was already down 8 pounds though remained markedly volume overloaded.  I arrange for an echocardiogram which  was recently performed and showed normal LV function with moderate LVH and grade 1 diastolic dysfunction.  No wall motion abnormalities or valvular heart disease.  With diuresis over the past 2 weeks, his weight is down 27 pounds and he looks much better on exam though he continues to have 1+ lower extremity edema and notes that symptoms are not back to baseline, complaining of ongoing dyspnea on exertion after walking about 100 yards.  Previous ED evaluation in January included a CT of the chest which was negative for PE though this did show coronary artery calcification.  Remote catheterization in 2001 did show 60% proximal/mid LAD disease as well as nonobstructive left main and circumflex disease.  The RCA was also occluded at that time.  I remain concerned about ischemia as a driver behind his symptoms.  We discussed this at length today along with options for ischemic evaluation.  Patient is not interested in stress testing and I will arrange for a right and left heart cardiac catheterization next week.  The patient understands that risks include but are not limited to stroke (1 in 1000), death (1 in 70), kidney failure [usually temporary] (1 in 500), bleeding (1 in 200), allergic reaction [possibly serious] (1 in 200), and agrees to proceed.  He remains on statin, beta-blocker, ARB, and Plavix therapy.  2.  Coronary artery disease: As above, remote catheterization showed occluded RCA with moderate LAD and otherwise nonobstructive disease in 2001.  In the setting of progressive dyspnea and heart failure symptoms despite normal LV function, I will pursue diagnostic catheterization.  We did discuss other options such as stress testing or coronary CTA however patient prefers and I believe he will be best served by a more definitive approach given severity of symptoms.  As he has had mild creatinine elevation at 1.57 in January, we will follow-up lab work today.  He has been on Plavix instead of aspirin in the  setting of prior lacunar stroke.  Continue statin, beta-blocker, and ARB.  3.  Essential hypertension: Stable.  4.  Hyperlipidemia: Continue statin therapy.  5.  Acute kidney injury: Creatinine was 1.75 on January 5 and then 1.57 on January 12.  Historically, GFR has been greater than 60.  Follow-up lab work today as he has not had any since he was placed on Lasix.  6.  Tobacco abuse/COPD: Continues to smoke.  Complete cessation advised.  7.  Syncope: As outlined in the HPI, he had vertiginous symptoms on January 5 followed by brief loss of consciousness.  He recently wore a ZIO monitor and did not have any symptoms during the monitoring period.  He has had a few brief episodes of lightheadedness since then.  He just mailed back his monitor  last week and we do not yet have any results.  Further recommendations pending those results.  8.  Type 2 diabetes mellitus: Followed closely by his primary care provider.  9.  Bilateral lower extremity claudication: Patient now reports lower extremity claudication after walking about 25 yards.  This affects both calves equally.  He has good distal pulses.  I will arrange for lower extremity arterial duplex/ABI.  Continue statin and Plavix therapy.    10.  Disposition: Follow-up lab work today.  Plan for right and left heart diagnostic catheterization next week with further recommendations to follow.  Murray Hodgkins, NP 08/31/2019, 6:15 PM

## 2019-08-31 NOTE — Patient Instructions (Addendum)
Medication Instructions:  Your physician recommends that you continue on your current medications as directed. Please refer to the Current Medication list given to you today.  *If you need a refill on your cardiac medications before your next appointment, please call your pharmacy*  Lab Work: Bmet and Cbc today.  You will need a COVID test prior to your procedure. Please report to the medical art building drive up test site  on 09/05/19 between 12:30pm-2:30pm.  If you have labs (blood work) drawn today and your tests are completely normal, you will receive your results only by: Marland Kitchen MyChart Message (if you have MyChart) OR . A paper copy in the mail If you have any lab test that is abnormal or we need to change your treatment, we will call you to review the results.  Testing/Procedures: Your physician has requested that you have a lower extremity arterial exercise duplex. During this test, exercise and ultrasound are used to evaluate arterial blood flow in the legs. Allow one hour for this exam. There are no restrictions or special instructions.   Follow-Up: At [facility], you and your health needs are our priority.  As part of our continuing mission to provide you with exceptional heart care, we have created designated Provider Care Teams.  These Care Teams include your primary Cardiologist (physician) and Advanced Practice Providers (APPs -  Physician Assistants and Nurse Practitioners) who all work together to provide you with the care you need, when you need it.  Your next appointment:   3 week(s)  The format for your next appointment:   In Person  Provider:    You may see Ida Rogue, MD or one of the following Advanced Practice Providers on your designated Care Team:    Murray Hodgkins, NP  Christell Faith, PA-C  Marrianne Mood, PA-C   Other Instructions [facility] Cardiac Cath Instructions   You are scheduled for a Cardiac Cath on:__2/11/21 @ 7:30am with Dr.  Gollan_______________________  Please arrive at ___7____am on the day of your procedure  Please expect a call from our [facility] to pre-register you  Do not eat/drink anything after midnight  Someone will need to drive you home  It is recommended someone be with you for the first 24 hours after your procedure  Wear clothes that are easy to get on/off and wear slip on shoes if possible   Medications bring a current list of all medications with you  __X_ You may take all of your medications the morning of your procedure with enough water to swallow safely  _X__ Do not take these medications before your procedure:_____________________________________________Fursemide the morning of the procedure.  Glipizide the morning of the procedure. Metformin the morning of the procedure and for 48 hours after.  ______________________________________________________________________________________________________________________________________________________________   Day of your procedure: Arrive at the [place] entrance.  Free valet service is available.  After entering the [facility] please check-in at the registration desk (1st desk on your right) to receive your armband. After receiving your armband someone will escort you to the cardiac cath/special procedures waiting area.  The usual length of stay after your procedure is about 2 to 3 hours.  This can vary.  If you have any questions, please call our office at [phone], or you may call the cardiac cath lab at [facility] directly at [phone]

## 2019-09-01 ENCOUNTER — Telehealth: Payer: Self-pay

## 2019-09-01 DIAGNOSIS — I509 Heart failure, unspecified: Secondary | ICD-10-CM

## 2019-09-01 LAB — CBC WITH DIFFERENTIAL/PLATELET
Basophils Absolute: 0.1 10*3/uL (ref 0.0–0.2)
Basos: 1 %
EOS (ABSOLUTE): 0 10*3/uL (ref 0.0–0.4)
Eos: 0 %
Hematocrit: 49 % (ref 37.5–51.0)
Hemoglobin: 15.3 g/dL (ref 13.0–17.7)
Immature Grans (Abs): 0 10*3/uL (ref 0.0–0.1)
Immature Granulocytes: 0 %
Lymphocytes Absolute: 2.4 10*3/uL (ref 0.7–3.1)
Lymphs: 27 %
MCH: 23.1 pg — ABNORMAL LOW (ref 26.6–33.0)
MCHC: 31.2 g/dL — ABNORMAL LOW (ref 31.5–35.7)
MCV: 74 fL — ABNORMAL LOW (ref 79–97)
Monocytes Absolute: 0.8 10*3/uL (ref 0.1–0.9)
Monocytes: 9 %
Neutrophils Absolute: 5.5 10*3/uL (ref 1.4–7.0)
Neutrophils: 63 %
Platelets: 360 10*3/uL (ref 150–450)
RBC: 6.61 x10E6/uL — ABNORMAL HIGH (ref 4.14–5.80)
RDW: 16.8 % — ABNORMAL HIGH (ref 11.6–15.4)
WBC: 8.8 10*3/uL (ref 3.4–10.8)

## 2019-09-01 LAB — BASIC METABOLIC PANEL
BUN/Creatinine Ratio: 18 (ref 10–24)
BUN: 32 mg/dL — ABNORMAL HIGH (ref 8–27)
CO2: 31 mmol/L — ABNORMAL HIGH (ref 20–29)
Calcium: 9.7 mg/dL (ref 8.6–10.2)
Chloride: 92 mmol/L — ABNORMAL LOW (ref 96–106)
Creatinine, Ser: 1.8 mg/dL — ABNORMAL HIGH (ref 0.76–1.27)
GFR calc Af Amer: 43 mL/min/{1.73_m2} — ABNORMAL LOW (ref >59)
GFR calc non Af Amer: 37 mL/min/{1.73_m2} — ABNORMAL LOW (ref >59)
Glucose: 91 mg/dL (ref 65–99)
Potassium: 4.6 mmol/L (ref 3.5–5.2)
Sodium: 137 mmol/L (ref 134–144)

## 2019-09-01 MED ORDER — FUROSEMIDE 40 MG PO TABS: 20.0000 mg | ORAL_TABLET | Freq: Every day | ORAL | 3 refills | Status: DC

## 2019-09-01 NOTE — Telephone Encounter (Signed)
-----   Message from Theora Gianotti, NP sent at 09/01/2019  7:49 AM EST ----- Kidney's look dry, which was somewhat expected given significant weight loss/urine output.  Hold lasix for two days and then resume at 20mg  daily.  He is due for cath next week.  He will need a bmet on Monday to reassess.  H/H wnl.  Based on indices, he may be mildly iron deficient.  PCP may wish to work this up if not done already.

## 2019-09-01 NOTE — Telephone Encounter (Signed)
Call to patient to review results and POC.   Pt reports that he has not taken Lasix this morning so he will hold it starting today and will go to the medical mall on Monday for labs.   Pt inquiring on how he should proceed with K Cl. Confirmed verbally with Ignacia Bayley, NP, pt will hold K CL for 2 days and resume at 10 mEq daily.   Advised pt to call for any further questions or concerns.

## 2019-09-05 ENCOUNTER — Other Ambulatory Visit
Admission: RE | Admit: 2019-09-05 | Discharge: 2019-09-05 | Disposition: A | Discharge status: Home / Self Care | Payer: Medicare Other | Source: Ambulatory Visit | Attending: Nurse Practitioner | Admitting: Nurse Practitioner

## 2019-09-05 ENCOUNTER — Other Ambulatory Visit: Payer: Self-pay

## 2019-09-05 DIAGNOSIS — Z01812 Encounter for preprocedural laboratory examination: Secondary | ICD-10-CM | POA: Diagnosis not present

## 2019-09-05 DIAGNOSIS — Z20822 Contact with and (suspected) exposure to covid-19: Secondary | ICD-10-CM | POA: Diagnosis not present

## 2019-09-06 LAB — SARS CORONAVIRUS 2 (TAT 6-24 HRS): SARS Coronavirus 2: NEGATIVE

## 2019-09-07 ENCOUNTER — Encounter: Payer: Self-pay | Admitting: Urology

## 2019-09-07 ENCOUNTER — Other Ambulatory Visit: Payer: Self-pay

## 2019-09-07 ENCOUNTER — Ambulatory Visit (INDEPENDENT_AMBULATORY_CARE_PROVIDER_SITE_OTHER): Payer: Medicare Other | Admitting: Urology

## 2019-09-07 VITALS — BP 140/74 | HR 69 | Ht 69.0 in | Wt 253.0 lb

## 2019-09-07 DIAGNOSIS — N5089 Other specified disorders of the male genital organs: Secondary | ICD-10-CM

## 2019-09-07 DIAGNOSIS — N4889 Other specified disorders of penis: Secondary | ICD-10-CM

## 2019-09-07 NOTE — Progress Notes (Signed)
09/07/2019 3:12 PM   Joshua Roy [birth year] [id number]  Referring provider: [name], MD [address] [place],  [place] [address]  Chief Complaint  Patient presents with  . Other    HPI: 69 y.o. male seen at the request of Dr. [name] for evaluation of "testicular swelling".  He was seen in mid January with increased weight gain and edema and states he had 30 pounds of water weight gain.  He did have penile and scrotal swelling which was mildly uncomfortable.  He underwent diuresis and had resolution of his edema including edema of the external genitalia.  He has no voiding complaints.  Denies previous history of urologic problems.   PMH: Past Medical History:  Diagnosis Date  . (HFpEF) heart failure with preserved ejection fraction ([place] [place])    a. 07/2019 Echo: EF 60-65%. Mod LVH. Gr1 DD. No rwma. Nl RV size/fxn. Mildly dil LA.  Marland Kitchen ACE-inhibitor cough   . Anxiety   . Bronchitis 06/27/2016  . COPD (chronic obstructive pulmonary disease) ([facility])    cath, mild inf. hypokinesis EF 49%, 100% ROA  . Coronary artery disease 2001   a. 2001 Cath: LM 10, LAD 60p/m, D1 30, LCX 20p, RCA 167m/d; b. 05/2017 Ex Mv: Ex time 5:46, antlat twi @ rest, fixed infarct w/ mild peri-infarct ischemia. EF 38%.  . Diabetes mellitus type II   . Hyperlipidemia   . Hypertension   . MI (myocardial infarction) ([place]) age 26  . OA (osteoarthritis)    knee OA, injected 2012 by ortho  . Pneumonia     Surgical History: Past Surgical History:  Procedure Laterality Date  . CARDIAC CATHETERIZATION  05/06/2000   @ [facility]  . COLONOSCOPY WITH PROPOFOL N/A 04/20/2018   Procedure: COLONOSCOPY WITH PROPOFOL;  Surgeon: [name], MD;  Location: [facility] ENDOSCOPY;  Service: Endoscopy;  Laterality: N/A;  . ESOPHAGOGASTRODUODENOSCOPY ENDOSCOPY    . KNEE ARTHROSCOPY  07/25/04   left  . [place]   right, open surgery- repair  . TOTAL KNEE ARTHROPLASTY Left 07/14/2016   Procedure: LEFT TOTAL KNEE  ARTHROPLASTY;  Surgeon: Gaynelle Arabian, MD;  Location: [facility] ORS;  Service: Orthopedics;  Laterality: Left;  . TOTAL KNEE ARTHROPLASTY Right 07/13/2017   Procedure: RIGHT TOTAL KNEE ARTHROPLASTY;  Surgeon: Gaynelle Arabian, MD;  Location: [facility] ORS;  Service: Orthopedics;  Laterality: Right;    Home Medications:  Allergies as of 09/07/2019      Reactions   Mucinex D [pseudoephedrine-guaifenesin Er]    Elevated BP, insomnia   Ramipril Cough      Medication List       Accurate as of September 07, 2019  3:12 PM. If you have any questions, ask your nurse or doctor.        accu-chek multiclix lancets Use as instructed to check blood sugar once daily or as needed.  Diagnosis:  E11.9   Non insulin-dependent.   acetaminophen 500 MG tablet Commonly known as: TYLENOL Take 1,000 mg by mouth every 8 (eight) hours as needed for mild pain or headache.   ALPRAZolam 0.25 MG tablet Commonly known as: XANAX Take 0.25 mg by mouth 2 (two) times daily as needed for anxiety.   atorvastatin 80 MG tablet Commonly known as: LIPITOR Take 1 tablet (80 mg total) by mouth daily.   clopidogrel 75 MG tablet Commonly known as: PLAVIX Take 75 mg by mouth daily.   fenofibrate 160 MG tablet Take 1 tablet (160 mg total) by mouth daily.   furosemide  40 MG tablet Commonly known as: LASIX Take 0.5 tablets (20 mg total) by mouth daily. What changed:   how much to take  when to take this   glimepiride 2 MG tablet Commonly known as: AMARYL Take one half tablet (1 mg) by mouth each morning What changed:   how much to take  how to take this  when to take this  additional instructions   glucose blood test strip Commonly known as: Accu-Chek Aviva Plus Use to check blood sugar once daily or as needed.  Diagnosis:  E11.9   Non insulin-dependent.   losartan-hydrochlorothiazide 50-12.5 MG tablet Commonly known as: HYZAAR Take 1 tablet by mouth daily.   metFORMIN 1000 MG tablet Commonly known as:  GLUCOPHAGE Take 1 tablet (1,000 mg total) by mouth 2 (two) times daily.   metoprolol tartrate 25 MG tablet Commonly known as: LOPRESSOR Take 1 tablet (25 mg total) by mouth 2 (two) times daily.   nicotine 21 mg/24hr patch Commonly known as: NICODERM CQ - dosed in mg/24 hours Place 21 mg onto the skin daily as needed (nicotine dependence).   potassium chloride SA 20 MEQ tablet Commonly known as: KLOR-CON Take 20 mEq by mouth daily.       Allergies:  Allergies  Allergen Reactions  . Mucinex D [Pseudoephedrine-Guaifenesin Er]     Elevated BP, insomnia  . Ramipril Cough    Family History: Family History  Problem Relation Age of Onset  . Heart disease Father        CAD, MI x 3  . Hypertension Father   . Alcohol abuse Father   . Diabetes Father   . Diabetes Sister   . Cancer Brother        lymphoma, in remission  . Colon cancer Brother   . Heart disease Sister        CABG x 3  . Prostate cancer Neg Hx     Social History:  reports that he has been smoking. He has a 40.00 pack-year smoking history. He has never used smokeless tobacco. He reports current alcohol use. He reports that he does not use drugs.  ROS: UROLOGY Frequent Urination?: Yes Hard to postpone urination?: No Burning/pain with urination?: No Get up at night to urinate?: Yes Leakage of urine?: Yes Urine stream starts and stops?: Yes Trouble starting stream?: No Do you have to strain to urinate?: No Blood in urine?: No Urinary tract infection?: No Sexually transmitted disease?: No Injury to kidneys or bladder?: No Painful intercourse?: No Weak stream?: Yes Erection problems?: Yes Penile pain?: No  Gastrointestinal Nausea?: No Vomiting?: No Indigestion/heartburn?: No Diarrhea?: No Constipation?: No  Constitutional Fever: No Night sweats?: No Weight loss?: No Fatigue?: Yes  Skin Skin rash/lesions?: No Itching?: No  Eyes Blurred vision?: No Double vision?:  No  Ears/Nose/Throat Sore throat?: No Sinus problems?: No  Hematologic/Lymphatic Swollen glands?: No Easy bruising?: No  Cardiovascular Leg swelling?: Yes Chest pain?: No  Respiratory Cough?: Yes Shortness of breath?: Yes  Endocrine Excessive thirst?: Yes  Musculoskeletal Back pain?: Yes Joint pain?: No  Neurological Headaches?: No Dizziness?: Yes  Psychologic Depression?: No Anxiety?: No  Physical Exam: BP 140/74   Pulse 69   Ht 5\' 9"  (1.753 m)   Wt 253 lb (114.8 kg)   BMI 37.36 kg/m   Constitutional:  Alert and oriented, No acute distress. HEENT: [place] AT, moist mucus membranes.  Trachea midline, no masses. Cardiovascular: No clubbing, cyanosis, or edema. Respiratory: Normal respiratory effort, no increased work of  breathing. GI: Abdomen is soft, nontender, nondistended, no abdominal masses GU: Phallus uncircumcised without lesions.  No penile edema.  Testes descended bilaterally without masses or tenderness.  No scrotal edema noted. Lymph: No cervical or inguinal lymphadenopathy. Skin: No rashes, bruises or suspicious lesions. Neurologic: Grossly intact, no focal deficits, moving all 4 extremities. Psychiatric: Normal mood and affect.   Assessment & Plan:    History penile/scrotal edema Resolved after diuresis.  He will follow up prn.   Abbie Sons, [place] [address], [place], [place] [address] [phone]

## 2019-09-08 ENCOUNTER — Telehealth: Payer: Self-pay | Admitting: Cardiovascular Disease

## 2019-09-08 ENCOUNTER — Other Ambulatory Visit: Payer: Self-pay | Admitting: Cardiovascular Disease

## 2019-09-08 ENCOUNTER — Other Ambulatory Visit: Payer: Self-pay

## 2019-09-08 ENCOUNTER — Ambulatory Visit
Admission: RE | Admit: 2019-09-08 | Discharge: 2019-09-08 | Disposition: A | Discharge status: Home / Self Care | Payer: Medicare Other | Attending: Cardiovascular Disease | Admitting: Cardiovascular Disease

## 2019-09-08 ENCOUNTER — Encounter: Payer: Self-pay | Admitting: Cardiology

## 2019-09-08 ENCOUNTER — Encounter
Admission: RE | Disposition: A | Discharge status: Home / Self Care | Payer: Self-pay | Source: Home / Self Care | Attending: Cardiovascular Disease

## 2019-09-08 DIAGNOSIS — F17210 Nicotine dependence, cigarettes, uncomplicated: Secondary | ICD-10-CM | POA: Insufficient documentation

## 2019-09-08 DIAGNOSIS — I110 Hypertensive heart disease with heart failure: Secondary | ICD-10-CM | POA: Insufficient documentation

## 2019-09-08 DIAGNOSIS — Z79899 Other long term (current) drug therapy: Secondary | ICD-10-CM | POA: Insufficient documentation

## 2019-09-08 DIAGNOSIS — Z8249 Family history of ischemic heart disease and other diseases of the circulatory system: Secondary | ICD-10-CM | POA: Insufficient documentation

## 2019-09-08 DIAGNOSIS — Z7984 Long term (current) use of oral hypoglycemic drugs: Secondary | ICD-10-CM | POA: Insufficient documentation

## 2019-09-08 DIAGNOSIS — M179 Osteoarthritis of knee, unspecified: Secondary | ICD-10-CM | POA: Insufficient documentation

## 2019-09-08 DIAGNOSIS — Z96653 Presence of artificial knee joint, bilateral: Secondary | ICD-10-CM | POA: Insufficient documentation

## 2019-09-08 DIAGNOSIS — Z888 Allergy status to other drugs, medicaments and biological substances status: Secondary | ICD-10-CM | POA: Diagnosis not present

## 2019-09-08 DIAGNOSIS — E119 Type 2 diabetes mellitus without complications: Secondary | ICD-10-CM | POA: Diagnosis present

## 2019-09-08 DIAGNOSIS — J449 Chronic obstructive pulmonary disease, unspecified: Secondary | ICD-10-CM | POA: Diagnosis not present

## 2019-09-08 DIAGNOSIS — Z833 Family history of diabetes mellitus: Secondary | ICD-10-CM | POA: Diagnosis not present

## 2019-09-08 DIAGNOSIS — E785 Hyperlipidemia, unspecified: Secondary | ICD-10-CM | POA: Diagnosis not present

## 2019-09-08 DIAGNOSIS — I25110 Atherosclerotic heart disease of native coronary artery with unstable angina pectoris: Secondary | ICD-10-CM | POA: Diagnosis not present

## 2019-09-08 DIAGNOSIS — Z7902 Long term (current) use of antithrombotics/antiplatelets: Secondary | ICD-10-CM | POA: Insufficient documentation

## 2019-09-08 DIAGNOSIS — I509 Heart failure, unspecified: Secondary | ICD-10-CM | POA: Diagnosis not present

## 2019-09-08 DIAGNOSIS — I5033 Acute on chronic diastolic (congestive) heart failure: Secondary | ICD-10-CM | POA: Insufficient documentation

## 2019-09-08 DIAGNOSIS — I2510 Atherosclerotic heart disease of native coronary artery without angina pectoris: Secondary | ICD-10-CM | POA: Diagnosis not present

## 2019-09-08 DIAGNOSIS — E1159 Type 2 diabetes mellitus with other circulatory complications: Secondary | ICD-10-CM | POA: Diagnosis present

## 2019-09-08 DIAGNOSIS — I70213 Atherosclerosis of native arteries of extremities with intermittent claudication, bilateral legs: Secondary | ICD-10-CM | POA: Insufficient documentation

## 2019-09-08 DIAGNOSIS — IMO0002 Reserved for concepts with insufficient information to code with codable children: Secondary | ICD-10-CM | POA: Diagnosis present

## 2019-09-08 DIAGNOSIS — I252 Old myocardial infarction: Secondary | ICD-10-CM | POA: Insufficient documentation

## 2019-09-08 DIAGNOSIS — N179 Acute kidney failure, unspecified: Secondary | ICD-10-CM | POA: Insufficient documentation

## 2019-09-08 DIAGNOSIS — R55 Syncope and collapse: Secondary | ICD-10-CM | POA: Insufficient documentation

## 2019-09-08 DIAGNOSIS — I25119 Atherosclerotic heart disease of native coronary artery with unspecified angina pectoris: Secondary | ICD-10-CM

## 2019-09-08 DIAGNOSIS — I200 Unstable angina: Secondary | ICD-10-CM

## 2019-09-08 HISTORY — PX: RIGHT/LEFT HEART CATH AND CORONARY ANGIOGRAPHY: CATH118266

## 2019-09-08 LAB — GLUCOSE, CAPILLARY: Glucose-Capillary: 132 mg/dL — ABNORMAL HIGH (ref 70–99)

## 2019-09-08 SURGERY — RIGHT/LEFT HEART CATH AND CORONARY ANGIOGRAPHY
Anesthesia: Moderate Sedation | Laterality: Bilateral

## 2019-09-08 MED ORDER — MIDAZOLAM HCL 2 MG/2ML IJ SOLN
INTRAMUSCULAR | Status: DC | PRN
  Administered 2019-09-08 (×2): 1 mg via INTRAVENOUS

## 2019-09-08 MED ORDER — ASPIRIN 81 MG PO CHEW: 81.0000 mg | CHEWABLE_TABLET | ORAL | Status: DC

## 2019-09-08 MED ORDER — ONDANSETRON HCL 4 MG/2ML IJ SOLN: 4.0000 mg | Freq: Four times a day (QID) | INTRAMUSCULAR | Status: DC | PRN

## 2019-09-08 MED ORDER — SODIUM CHLORIDE 0.9% FLUSH: 3.0000 mL | Freq: Two times a day (BID) | INTRAVENOUS | Status: DC

## 2019-09-08 MED ORDER — ACETAMINOPHEN 325 MG PO TABS: 650.0000 mg | ORAL_TABLET | ORAL | Status: DC | PRN

## 2019-09-08 MED ORDER — FENTANYL CITRATE (PF) 100 MCG/2ML IJ SOLN
INTRAMUSCULAR | Status: AC
  Filled 2019-09-08: qty 2

## 2019-09-08 MED ORDER — HEPARIN (PORCINE) IN NACL 1000-0.9 UT/500ML-% IV SOLN
INTRAVENOUS | Status: DC | PRN
  Administered 2019-09-08: 500 mL

## 2019-09-08 MED ORDER — IOHEXOL 300 MG/ML  SOLN
INTRAMUSCULAR | Status: DC | PRN
  Administered 2019-09-08: 09:00:00 80 mL

## 2019-09-08 MED ORDER — FENTANYL CITRATE (PF) 100 MCG/2ML IJ SOLN
INTRAMUSCULAR | Status: DC | PRN
  Administered 2019-09-08 (×2): 25 ug via INTRAVENOUS

## 2019-09-08 MED ORDER — LABETALOL HCL 5 MG/ML IV SOLN: 10.0000 mg | INTRAVENOUS | Status: DC | PRN

## 2019-09-08 MED ORDER — SODIUM CHLORIDE 0.9 % IV SOLN: 250.0000 mL | INTRAVENOUS | Status: DC | PRN

## 2019-09-08 MED ORDER — HEPARIN (PORCINE) IN NACL 1000-0.9 UT/500ML-% IV SOLN
INTRAVENOUS | Status: AC
  Filled 2019-09-08: qty 1000

## 2019-09-08 MED ORDER — SODIUM CHLORIDE 0.9 % IV SOLN: INTRAVENOUS | Status: DC

## 2019-09-08 MED ORDER — SODIUM CHLORIDE 0.9% FLUSH: 3.0000 mL | INTRAVENOUS | Status: DC | PRN

## 2019-09-08 MED ORDER — HYDRALAZINE HCL 20 MG/ML IJ SOLN: 10.0000 mg | INTRAMUSCULAR | Status: DC | PRN

## 2019-09-08 MED ORDER — NITROGLYCERIN 0.4 MG SL SUBL: 0.4000 mg | SUBLINGUAL_TABLET | SUBLINGUAL | 3 refills | Status: DC | PRN

## 2019-09-08 MED ORDER — MIDAZOLAM HCL 2 MG/2ML IJ SOLN
INTRAMUSCULAR | Status: AC
  Filled 2019-09-08: qty 2

## 2019-09-08 SURGICAL SUPPLY — 14 items
CATH INFINITI 5FR JL4 (CATHETERS) ×1 IMPLANT
CATH INFINITI JR4 5F (CATHETERS) ×1 IMPLANT
CATH SWANZ 7F THERMO (CATHETERS) ×1 IMPLANT
DEVICE CLOSURE MYNXGRIP 5F (Vascular Products) ×1 IMPLANT
GUIDEWIRE EMER 3M J .025X150CM (WIRE) ×1 IMPLANT
KIT MANI 3VAL PERCEP (MISCELLANEOUS) ×2 IMPLANT
NDL PERC 18GX7CM (NEEDLE) IMPLANT
NDL SMART REG 18GX2-3/4 (NEEDLE) IMPLANT
NEEDLE PERC 18GX7CM (NEEDLE) ×2 IMPLANT
NEEDLE SMART REG 18GX2-3/4 (NEEDLE) ×2 IMPLANT
PACK CARDIAC CATH (CUSTOM PROCEDURE TRAY) ×2 IMPLANT
SHEATH AVANTI 5FR X 11CM (SHEATH) ×1 IMPLANT
SHEATH AVANTI 7FRX11 (SHEATH) ×1 IMPLANT
WIRE GUIDERIGHT .035X150 (WIRE) ×1 IMPLANT

## 2019-09-08 NOTE — Telephone Encounter (Signed)
Referrals are in place and they are working on them.  Tks

## 2019-09-08 NOTE — Telephone Encounter (Signed)
Cardiac catheterization today Left main disease, three-vessel native coronary disease Moderate to severe pulmonary hypertension Markedly elevated wedge pressure out of proportion to his left ventricular end-diastolic pressure --- Known COPD, long smoking history  Pam,  can we place referral to CT surgery for consideration of surgery/CABG. Case has been discussed with interventional cardiology/arida  can we also place referral to advanced heart failure clinic in [facility] for assistance with his pulmonary hypertension -Is her way to optimize his medical management to help guide through surgery. -Underlying renal dysfunction, history of poorly controlled diabetes  thx TGollan

## 2019-09-08 NOTE — Addendum Note (Signed)
Addended by: Valora Corporal on: 09/08/2019 10:09 AM   Modules accepted: Orders

## 2019-09-08 NOTE — Telephone Encounter (Signed)
I would be happy to see.

## 2019-09-08 NOTE — Progress Notes (Signed)
Dr. Rockey Situ at bedside, speaking with pt. And his wife re: cath results and need for CABG. Both verbalize understanding of conversation.

## 2019-09-09 ENCOUNTER — Encounter: Payer: Self-pay | Admitting: Urology

## 2019-09-10 NOTE — H&P (Signed)
H&P Addendum, precardiac catheterization  Patient was seen and evaluated prior to Cardiac catheterization procedure Symptoms, prior testing details again confirmed with the patient Patient examined, no significant change from prior exam Lab work reviewed in detail personally by myself Patient understands risk and benefit of the procedure, willing to proceed  Signed, Tim Rakeya Glab, MD, Ph.D [facility]    

## 2019-09-14 ENCOUNTER — Other Ambulatory Visit: Payer: Self-pay | Admitting: *Deleted

## 2019-09-14 ENCOUNTER — Encounter: Payer: Self-pay | Admitting: Surgery

## 2019-09-14 ENCOUNTER — Other Ambulatory Visit: Payer: Self-pay

## 2019-09-14 ENCOUNTER — Institutional Professional Consult (permissible substitution) (INDEPENDENT_AMBULATORY_CARE_PROVIDER_SITE_OTHER): Payer: Medicare Other | Admitting: Surgery

## 2019-09-14 VITALS — BP 150/77 | HR 80 | Temp 97.7°F | Resp 16 | Ht 69.0 in | Wt 253.0 lb

## 2019-09-14 DIAGNOSIS — I2510 Atherosclerotic heart disease of native coronary artery without angina pectoris: Secondary | ICD-10-CM

## 2019-09-14 DIAGNOSIS — I200 Unstable angina: Secondary | ICD-10-CM

## 2019-09-15 ENCOUNTER — Encounter: Payer: Self-pay | Admitting: *Deleted

## 2019-09-15 ENCOUNTER — Encounter: Payer: Self-pay | Admitting: Surgery

## 2019-09-15 NOTE — Progress Notes (Signed)
PCP is [name], MD Referring Provider is Minna Merritts, MD  Chief Complaint  Patient presents with  . Coronary Artery Disease    eval for CABG...ECHO 08/25/19, CATH 09/08/19    HPI:  The patient is a 69 year old gentleman with history of type 2 diabetes hypertension, hyperlipidemia, longtime 2 pack a day smoker who decreased to 1 pack/day over the past month, bronchitis and COPD and coronary artery disease status post myocardial infarction at age 78.  Catheterization at that time showed an occluded mid to distal RCA with otherwise moderate coronary disease.  On January 5 he had sudden onset of dizziness and weakness followed by a fall after watching a basketball game and having a drink at a friend's house.  His wife picked him up shortly after this and he was witnessed to briefly lose consciousness.  He was taken to the [facility] ED.  He was noted to have an elevation of his creatinine to 1.75 with a high-sensitivity troponin of 13.  He waited in the emergency room for about 3 hours and felt better and decided to leave.  The following day he noted beginning of progressive weight gain and said he gained about 25 to 30 pounds over a couple week period.  This was associated with increasing dyspnea on exertion, orthopnea, and lower extremity and scrotal edema.  He was seen by his primary physician and referred back to the emergency department on January 9 where he was noted to be hypoxemic with saturations in the 80s on room air.  His high-sensitivity troponin was 28 and BNP was 401.  He had a CT angio of the chest which was negative for pulmonary embolism but did show mild cardiomegaly with coronary vascular calcification and aortic atherosclerosis.  Admission was recommended but he declined.  He was discharged from the emergency department and his PCP placed him on Lasix 40 mg daily.  His weight began to come down and he was seen in follow-up by cardiology on January 14.  A 2D echocardiogram  showed normal LV systolic function with grade 1 diastolic dysfunction and no significant valvular disease.  He continued to diurese and lost almost 30 pounds of fluid.  He continues to have exertional fatigue and shortness of breath.  He has not had any chest pain or pressure.  He underwent cardiac catheterization on 09/08/2019 which showed a 70% mid to distal left main stenosis.  The proximal to mid LAD had 70% stenosis.  The left circumflex had an 80% mid vessel stenosis.  The proximal RCA had 95% stenosis and was occluded after an acute marginal branch.  The posterior descending branch filled by collaterals.  Right heart catheterization showed moderate pulmonary hypertension with PA pressure of 67/23 and a mean of 42.  Wedge pressure was 43 if accurate.  LVEDP was only 10.  Cardiac index was 2.13.  He is here today with his wife. Past Medical History:  Diagnosis Date  . (HFpEF) heart failure with preserved ejection fraction ([place])    a. 07/2019 Echo: EF 60-65%. Mod LVH. Gr1 DD. No rwma. Nl RV size/fxn. Mildly dil LA.  Marland Kitchen ACE-inhibitor cough   . Anxiety   . Bronchitis 06/27/2016  . COPD (chronic obstructive pulmonary disease) (HCC)    cath, mild inf. hypokinesis EF 49%, 100% ROA  . Coronary artery disease 2001   a. 2001 Cath: LM 10, LAD 60p/m, D1 30, LCX 20p, RCA 141m/d; b. 05/2017 Ex Mv: Ex time 5:46, antlat twi @  rest, fixed infarct w/ mild peri-infarct ischemia. EF 38%.  . Diabetes mellitus type II   . Hyperlipidemia   . Hypertension   . MI (myocardial infarction) ([place]) age 8  . OA (osteoarthritis)    knee OA, injected 2012 by ortho  . Pneumonia     Past Surgical History:  Procedure Laterality Date  . CARDIAC CATHETERIZATION  05/06/2000   @ [place]  . COLONOSCOPY WITH PROPOFOL N/A 04/20/2018   Procedure: COLONOSCOPY WITH PROPOFOL;  Surgeon: [name], MD;  Location: [facility] ENDOSCOPY;  Service: Endoscopy;  Laterality: N/A;  . ESOPHAGOGASTRODUODENOSCOPY ENDOSCOPY    . KNEE ARTHROSCOPY   07/25/04   left  . [place]   right, open surgery- repair  . RIGHT/LEFT HEART CATH AND CORONARY ANGIOGRAPHY Bilateral 09/08/2019   Procedure: RIGHT/LEFT HEART CATH AND CORONARY ANGIOGRAPHY;  Surgeon: Minna Merritts, MD;  Location: [place] CV LAB;  Service: Cardiovascular;  Laterality: Bilateral;  . 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 TOTAL KNEE ARTHROPLASTY Right 07/13/2017   Procedure: RIGHT TOTAL KNEE ARTHROPLASTY;  Surgeon: Gaynelle Arabian, MD;  Location: [facility] ORS;  Service: Orthopedics;  Laterality: Right;    Family History  Problem Relation Age of Onset  . Heart disease Father        CAD, MI x 3  . Hypertension Father   . Alcohol abuse Father   . Diabetes Father   . Diabetes Sister   . Cancer Brother        lymphoma, in remission  . Colon cancer Brother   . Heart disease Sister        CABG x 3  . Prostate cancer Neg Hx     Social History Social History   Tobacco Use  . Smoking status: Current Every Day Smoker    Packs/day: 1.00    Years: 40.00    Pack years: 40.00  . Smokeless tobacco: Never Used  Substance Use Topics  . Alcohol use: Yes    Alcohol/week: 0.0 standard drinks    Comment: 5-6 cocktails, 1 times a week  . Drug use: No    Current Outpatient Medications  Medication Sig Dispense Refill  . acetaminophen (TYLENOL) 500 MG tablet Take 1,000 mg by mouth every 8 (eight) hours as needed for mild pain or headache.    . ALPRAZolam (XANAX) 0.25 MG tablet Take 0.25 mg by mouth 2 (two) times daily as needed for anxiety.     Marland Kitchen atorvastatin (LIPITOR) 80 MG tablet Take 1 tablet (80 mg total) by mouth daily. 90 tablet 3  . clopidogrel (PLAVIX) 75 MG tablet Take 75 mg by mouth daily.    . fenofibrate 160 MG tablet Take 1 tablet (160 mg total) by mouth daily. 90 tablet 1  . furosemide (LASIX) 40 MG tablet Take 0.5  tablets (20 mg total) by mouth daily. (Patient taking differently: Take 40 mg by mouth 2 (two) times daily. ) 30 tablet 3  . glimepiride (AMARYL) 2 MG tablet Take one half tablet (1 mg) by mouth each morning (Patient taking differently: Take 2 mg by mouth daily with breakfast. ) 45 tablet 1  . glucose blood (ACCU-CHEK AVIVA PLUS) test strip Use to check blood sugar once daily or as needed.  Diagnosis:  E11.9   Non insulin-dependent. 100 each 3  . Lancets (ACCU-CHEK MULTICLIX) lancets Use as instructed to check blood  sugar once daily or as needed.  Diagnosis:  E11.9   Non insulin-dependent. 102 each 3  . losartan-hydrochlorothiazide (HYZAAR) 50-12.5 MG tablet Take 1 tablet by mouth daily.    . metFORMIN (GLUCOPHAGE) 1000 MG tablet Take 1 tablet (1,000 mg total) by mouth 2 (two) times daily. 180 tablet 1  . metoprolol tartrate (LOPRESSOR) 25 MG tablet Take 1 tablet (25 mg total) by mouth 2 (two) times daily. 180 tablet 1  . nicotine (NICODERM CQ - DOSED IN MG/24 HOURS) 21 mg/24hr patch Place 21 mg onto the skin daily as needed (nicotine dependence).     . nitroGLYCERIN (NITROSTAT) 0.4 MG SL tablet Place 1 tablet (0.4 mg total) under the tongue every 5 (five) minutes as needed for chest pain. 25 tablet 3  . potassium chloride SA (KLOR-CON) 20 MEQ tablet Take 20 mEq by mouth daily.     No current facility-administered medications for this visit.    Allergies  Allergen Reactions  . Mucinex D [Pseudoephedrine-Guaifenesin Er]     Elevated BP, insomnia  . Ramipril Cough    Review of Systems  Constitutional: Positive for activity change and fatigue.  HENT: Negative.   Eyes: Negative.   Respiratory: Positive for apnea, cough and shortness of breath.   Cardiovascular: Positive for leg swelling. Negative for chest pain and palpitations.  Gastrointestinal: Negative.   Endocrine: Negative.   Genitourinary: Positive for frequency and scrotal swelling.  Musculoskeletal: Positive for myalgias.  Skin:  Negative.   Allergic/Immunologic: Negative.   Neurological: Positive for dizziness and syncope.  Psychiatric/Behavioral:       Depression    BP (!) 150/77 (BP Location: Left Arm, Patient Position: Sitting, Cuff Size: Normal)   Pulse 80   Temp 97.7 F (36.5 C)   Resp 16   Ht 5\' 9"  (1.753 m)   Wt 253 lb (114.8 kg)   SpO2 90%   BMI 37.36 kg/m  Physical Exam Constitutional:      Appearance: He is obese.  HENT:     Head: Normocephalic and atraumatic.  Eyes:     Extraocular Movements: Extraocular movements intact.     Conjunctiva/sclera: Conjunctivae normal.     Pupils: Pupils are equal, round, and reactive to light.  Neck:     Vascular: No carotid bruit.  Cardiovascular:     Rate and Rhythm: Normal rate and regular rhythm.     Heart sounds: Normal heart sounds. No murmur.  Pulmonary:     Effort: Pulmonary effort is normal.     Breath sounds: Normal breath sounds.  Abdominal:     Tenderness: There is no abdominal tenderness.  Musculoskeletal:        General: Swelling present. Normal range of motion.     Cervical back: Normal range of motion and neck supple.  Lymphadenopathy:     Cervical: No cervical adenopathy.  Skin:    General: Skin is warm and dry.  Neurological:     General: No focal deficit present.     Mental Status: He is alert and oriented to person, place, and time.  Psychiatric:        Mood and Affect: Mood normal.        Behavior: Behavior normal.        Thought Content: Thought content normal.        Judgment: Judgment normal.      Diagnostic Tests:   Patient Name:  Joshua Roy Date of Exam: 08/25/2019  Medical Rec #: [id number]  Height:    69.0 in  Accession #:  [id number]      Weight:    270.2 lb  Date of Birth: 1950/08/24      BSA:     2.35 m  Patient Age:  5 years       BP:      118/70 mmHg  Patient Gender: M          HR:      87 bpm.  Exam Location: [place]    Procedure: 2D Echo, Cardiac Doppler, Color Doppler and Intracardiac       Opacification Agent   Indications:  I50.20* Unspecified systolic (congestive) heart failure;  R06.02         SOB    History:    Patient has no prior history of Echocardiogram  examinations.         CHF, CAD, COPD, Signs/Symptoms:Shortness of Breath and  Syncope;         Risk Factors:Current Smoker, Hypertension, Diabetes and         Dyslipidemia.    Sonographer:  Pilar Jarvis RDMS, RVT, RDCS  Referring Phys: [place]    1. Left ventricular ejection fraction, by visual estimation, is 60 to  65%. The left ventricle has normal function. There is moderately increased  left ventricular hypertrophy.  2. Left ventricular diastolic parameters are consistent with Grade I  diastolic dysfunction (impaired relaxation).  3. The left ventricle has no regional wall motion abnormalities.  4. Global right ventricle has normal systolic function.The right  ventricular size is normal. No increase in right ventricular wall  thickness.  5. Left atrial size was mildly dilated.  6. TR signal is inadequate for assessing pulmonary artery systolic  pressure.   FINDINGS  Left Ventricle: Left ventricular ejection fraction, by visual estimation,  is 60 to 65%. The left ventricle has normal function. The left ventricle  has no regional wall motion abnormalities. There is moderately increased  left ventricular hypertrophy.  Left ventricular diastolic parameters are consistent with Grade I  diastolic dysfunction (impaired relaxation). Normal left atrial pressure.   Right Ventricle: The right ventricular size is normal. No increase in  right ventricular wall thickness. Global RV systolic function is has  normal systolic function.   Left Atrium: Left atrial size was mildly dilated.   Right Atrium: Right atrial size was normal in size    Pericardium: There is no evidence of pericardial effusion.   Mitral Valve: The mitral valve is normal in structure. No evidence of  mitral valve regurgitation. No evidence of mitral valve stenosis by  observation.   Tricuspid Valve: The tricuspid valve is normal in structure. Tricuspid  valve regurgitation is not demonstrated.   Aortic Valve: The aortic valve was not well visualized. Aortic valve  regurgitation is not visualized. Mild aortic valve sclerosis is present,  with no evidence of aortic valve stenosis. Aortic valve mean gradient  measures 5.0 mmHg. Aortic valve peak  gradient measures 9.2 mmHg. Aortic valve area, by VTI measures 2.59 cm.   Pulmonic Valve: The pulmonic valve was normal in structure. Pulmonic valve  regurgitation is not visualized. Pulmonic regurgitation is not visualized.   Aorta: The aortic root, ascending aorta and aortic arch are all  structurally normal, with no evidence of dilitation or obstruction.   Venous: The inferior vena cava is normal in size with greater than 50%  respiratory variability, suggesting right atrial pressure of 3 mmHg.   IAS/Shunts: No atrial  level shunt detected by color flow Doppler. There is  no evidence of a patent foramen ovale. No ventricular septal defect is  seen or detected. There is no evidence of an atrial septal defect.     LEFT VENTRICLE  PLAX 2D  LVIDd:     5.00 cm Diastology  LVIDs:     3.60 cm LV e' lateral:  8.05 cm/s  LV PW:     1.70 cm LV E/e' lateral: 9.7  LV IVS:    1.90 cm LV e' medial:  5.22 cm/s  LVOT diam:   2.30 cm LV E/e' medial: 15.0  LV SV:     64 ml  LV SV Index:  25.55  LVOT Area:   4.15 cm     RIGHT VENTRICLE       IVC  RV Basal diam: 3.90 cm   IVC diam: 2.10 cm  RV S prime:   14.10 cm/s  TAPSE (M-mode): 2.3 cm   LEFT ATRIUM       Index    RIGHT ATRIUM      Index  LA diam:    4.50 cm 1.92 cm/m RA Area:   21.40 cm   LA Vol (A2C):  60.1 ml 25.60 ml/m RA Volume:  63.30 ml 26.96 ml/m  LA Vol (A4C):  72.7 ml 30.96 ml/m  LA Biplane Vol: 69.8 ml 29.73 ml/m  AORTIC VALVE          PULMONIC VALVE  AV Area (Vmax):  2.73 cm   PV Vmax:    0.93 m/s  AV Area (Vmean):  2.54 cm   PV Peak grad: 3.4 mmHg  AV Area (VTI):   2.59 cm  AV Vmax:      152.00 cm/s  AV Vmean:     101.000 cm/s  AV VTI:      0.277 m  AV Peak Grad:   9.2 mmHg  AV Mean Grad:   5.0 mmHg  LVOT Vmax:     100.00 cm/s  LVOT Vmean:    61.800 cm/s  LVOT VTI:     0.173 m  LVOT/AV VTI ratio: 0.62    AORTA  Ao Root diam: 3.10 cm  Ao Asc diam: 3.60 cm   MITRAL VALVE  MV Area (PHT): 3.31 cm       SHUNTS  MV PHT:    66.41 msec      Systemic VTI: 0.17 m  MV Decel Time: 229 msec       Systemic Diam: 2.30 cm  MV E velocity: 78.10 cm/s 103 cm/s  MV A velocity: 91.20 cm/s 70.3 cm/s  MV E/A ratio: 0.86    1.5     Joshua Rogue MD  Electronically signed by Joshua Rogue MD  Signature Date/Time: 08/25/2019/6:37:13 PM     Physicians Panel Physicians Referring Physician Case Authorizing Physician  Minna Merritts, MD (Primary)       Procedures RIGHT/LEFT HEART CATH AND CORONARY ANGIOGRAPHY     Conclusion Mid LM to Dist LM lesion is 70% stenosed.  Ost LM lesion is 40% stenosed.  Prox LAD to Mid LAD lesion is 70% stenosed.  Mid Cx lesion is 80% stenosed.  Ost RCA to Prox RCA lesion is 95% stenosed.  Mid RCA to Dist RCA lesion is 100% stenosed.  RPAV lesion is 90% stenosed.  Hemodynamic findings consistent with moderate pulmonary hypertension.  LV end diastolic pressure is normal.           Indications Chronic congestive heart failure,  unspecified heart failure type ([place]) [I50.9 (ICD-10-CM)]     Procedural Details Technical Details Cardiac Catheterization Procedure Note  Name: Joshua Roy MRN: [id number] DOB:  12-Nov-1950  Procedure: Left Heart Cath, Selective Coronary Angiography, LV angiography  Indication:  69 year old male with a history of CAD status post catheterization in 2001 revealing an occluded mid-distal RCA with otherwise moderate CAD, hypertension, hyperlipidemia, type 2 diabetes mellitus, obesity, tobacco abuse, and COPD stress testing in November 2018 in the setting of preoperative evaluation, and this showed prior inferior scar with mild peri-infarct ischemia.  August 02 2019,sudden onset of dizziness and vertiginous symptoms, followed by weakness and fall. disoriented.  Once he was in car going home from party, he was witnessed to briefly lose consciousness. He remained disoriented and was taken to the [facility] ED.  There, creatinine was elevated above prior baseline at 1.75. H&H were normal. High-sensitivity troponin was 13. After waiting in the waiting room for about 3 hours, he decided to leave since he felt better. progressive weight gain (25 pounds), along with increasing dyspnea exertion, orthopnea, and lower extremity and scrotal edema.   At a follow-up with primary care, he was noted to be volume overloaded and referred to the emergency department on January 12.  hypoxic with saturations in the 80s on room air. BNP was 401. High-sensitivity troponin was 28.  Creatinine was slightly improved at 1.57 with a BUN of 27. CT angio of the chest was negative for PE and showed mild cardiomegaly with coronary vascular calcification and aortic atherosclerosis.  . Admission was recommended however patient declined   echocardiogram, normal LV function and grade 1 diastolic dysfunction. No significant valvular disease was noted. continues to experience dyspnea on exertion after walking about 50 to 100 yards. He has not experienced any chest pain. He has noted significant improvement in abdominal girth and scrotal edema.   bilateral lower extremity/calf heaviness and claudication after  walking about 25 yards.  He was referred for right and left heart cath given unstable anginal sx.   Procedural details: The right groin was prepped, draped, and anesthetized with 1% lidocaine. Using modified Seldinger technique, a 5 French sheath was introduced into the right femoral artery. Standard Judkins catheters (JL 4, JR 4 and pigtail catheter) were used for coronary angiography and left ventriculography. Catheter exchanges were performed over a guidewire. There were no immediate procedural complications. The patient was transferred to the post catheterization recovery area for further monitoring.  Moderate sedation: 1. Sedation used: 2 mg versed IV, 50 ug fentanyl IV 2. Time of administration: 8:30:AM Time patient left for recovery: 9:15 Am 3. I was Face to Face with the patient during this time: (code: [phone])   Procedural Findings:   Coronary angiography:  Coronary dominance: Right or codominant  Left mainstem: Large vessel that bifurcates into the LAD and left circumflex, mild to moderate ostial left main disease 40%, 70% distal left main disease  Left anterior descending (LAD): Large vessel that extends to the apical region, diagonal branch 2 of moderate size, long region of stenosis estimated 70%  Left circumflex (LCx): Large vessel with OM branch 2, mid left circumflex estimated at 80%  Right coronary artery (RCA): Right dominant vessel with PL and PDA, 80 to 90% proximal stenosis, occluded in the mid vessels with collaterals from left to right  Left ventriculography: Left ventricular systolic function was not measured given renal dysfunction creatinine 1.8, crossed for pressures  No significant aortic valve stenosis   Right heart pressures RA  4 RV 61/7 PA 67/23 mean 42 Wedge pressure 43 Left ventricular end-diastolic pressure 10  Cardiac index 2.13 cardiac output 4.79  Final Conclusions:  Severe three-vessel native coronary disease including left main Elevated  right heart pressures consistent with moderate to severe pulmonary hypertension\ Curiously wedge is markedly elevated with appropriate left ventricular end-diastolic pressure, etiology of disconnect is unclear. No significant MR. Recent CT scan with no significant abnormality noted, normal ejection fraction on echo. He has been on Lasix, overdiuresis contributing to worsening renal dysfunction -Severe underlying COPD from long history of smoking   Recommendations:  Case discussed with interventional cardiology, Concerned about distal left main disease Also mid circumflex, LAD Will refer to CT surgery, also advanced heart failure clinic to assist with right heart pressures --He does not want to stay in the hospital and be transferred to [facility] , with optimization of his condition Prefers to do this as an outpatient   Joshua Roy 09/08/2019, 9:28 AM  Estimated blood loss <50 mL.   During this procedure medications were administered to achieve and maintain moderate conscious sedation while the patient's heart rate, blood pressure, and oxygen saturation were continuously monitored and I was present face-to-face 100% of this time.     Medications (Filter: Administrations occurring from 09/08/19 0808 to 09/08/19 0920)  midazolam (VERSED) injection (mg) Total dose: 2 mg  Date/Time   Rate/Dose/Volume Action  09/08/19 0817  1 mg Given  0824  1 mg Given  fentaNYL (SUBLIMAZE) injection (mcg) Total dose: 50 mcg  Date/Time   Rate/Dose/Volume Action  09/08/19 0817  25 mcg Given  0824  25 mcg Given  iohexol (OMNIPAQUE) 300 MG/ML solution (mL) Total volume: 80 mL  Date/Time   Rate/Dose/Volume Action  09/08/19 0902  80 mL Given  Heparin (Porcine) in NaCl 1000-0.9 UT/500ML-% SOLN (mL) Total volume: 500 mL  Date/Time   Rate/Dose/Volume Action  09/08/19 0908  500 mL Given     Sedation Time Sedation Time Physician-1: 40 minutes 35 seconds        Contrast Medication Name Total Dose   iohexol (OMNIPAQUE) 300 MG/ML solution 80 mL     Radiation/Fluoro Fluoro time: 5.2 (min)  DAP: 31.7 (Gycm2)  Cumulative Air Kerma: 425 (mGy)        Coronary Findings Diagnostic Dominance: Right  Left Main  Ost LM lesion 40% stenosed  Ost LM lesion is 40% stenosed.  Mid LM to Dist LM lesion 70% stenosed  Mid LM to Dist LM lesion is 70% stenosed.   Left Anterior Descending  Prox LAD to Mid LAD lesion 70% stenosed  Prox LAD to Mid LAD lesion is 70% stenosed.   Left Circumflex  Mid Cx lesion 80% stenosed  Mid Cx lesion is 80% stenosed.   Right Coronary Artery  Vessel is moderate in size.  Ost RCA to Prox RCA lesion 95% stenosed  Ost RCA to Prox RCA lesion is 95% stenosed.  Mid RCA to Dist RCA lesion 100% stenosed  Mid RCA to Dist RCA lesion is 100% stenosed. The lesion is chronically occluded.   Right Posterior Descending Artery  Vessel is small in size.   Right Posterior Atrioventricular Artery  Vessel is small in size.  RPAV lesion 90% stenosed  RPAV lesion is 90% stenosed.   First Right Posterolateral Branch  Collaterals  1st RPL filled by collaterals from Dist LAD.    Intervention No interventions have been documented.          Right Heart Right Heart Pressures Hemodynamic  findings consistent with moderate pulmonary hypertension. LV EDP is normal.                    Coronary Diagrams Diagnostic Dominance: Right  &&&&&  Intervention      Implants  Vascular Products  Device Closure Mynxgrip 47f - [id number] - Implanted  Inventory item: DEVICE CLOSURE MYNXGRIP 7F Model/Cat number: [id number]  Manufacturer: [employer] Lot number: [id number]  Device identifier: [id number] Device identifier type: GS1  Area Of Implantation: Groin    GUDID Information   Request status Successful     Brand name: [facility] Version/Model: [id number]   Company name: [employer]. MRI safety info as of 09/08/19: MR Safe   Contains dry or latex rubber: No      GMDN P.T. name: Wound hydrogel dressing, non-antimicrobial     As of 09/08/2019    Status: Implanted          [employer]ngo Images Link to Procedure Log  Show images for CARDIAC CATHETERIZATION Procedure Log     Images on Long Term Storage   Show images for Joshua, Roy Data (last day) before discharge   AO Systolic Cath Pressure   AO Diastolic Cath Pressure   AO Mean Cath Pressure   LV Systolic Cath Pressure   LV End Diastolic   PA Systolic Cath Pressure   PA Diastolic Cath Pressure   PA Mean Cath Pressure   RA Wedge A Wave   RA Wedge V Wave   RV Systolic Cath Pressure   RV Diastolic Cath Pressure   RV End Diastolic   PCW A Wave   PCW V Wave   PCW Mean   AO O2 Sat   PA O2 Sat   AO O2 Sat   Fick C.O.   Fick C.I.   --  --  --  --  --  --  --  --  8 mmHg  5 mmHg  --  --  --  43 mmHg  46 mmHg  43 mmHg  --  --  --  4.79 L/min  2.13 L/min/m2   --  --  --  --  --  67 mmHg  23 mmHg  42 mmHg  --  --  --  --  --  --  --  --  --  --  --  --  --   --  --  --  --  --  --  --  --  --  --  59 mmHg  0 mmHg  7 mmHg  --  --  --  --  --  --  --  --   --  --  --  --  --  --  --  --  --  --  61 mmHg  0 mmHg  7 mmHg  --  --  --  --  --  --  --  --   113  56 mmHg  78 mmHg  --  --  --  --  --  --  --  --  --  --  --  --  --  --  --  --  --  --   --  --  --  117 mmHg  12 mmHg  --  --  --  --  --  --  --  --  --  --  --  --  --  --  --  --   --  --  --  125 mmHg  17 mmHg  --  --  --  --  --  --  --  --  --  --  --  --  --  --  --  --   --  --  --  118 mmHg  6 mmHg  --  --  --  --  --  --  --  --  --  --  --  --  --  --  --  --   --  --  --  112 mmHg  7 mmHg  --  --  --  --  --  --  --  --  --  --  --  --  --  --  --  --   --  --  --  117 mmHg  10 mmHg  --  --  --  --  --  --  --  --  --  --  --  --  --  --  --  --   115  56 mmHg  80 mmHg  --  --  --  --  --  --  --  --  --  --  --  --  --  --  --  --  --  --   --  --  --  --  --  --  --  --  --  --  --  --  --  --  --  --  90 %   --  SA  --  --   --  --  --  --  --  --  --  --  --  --  --  --  --                    Impression:  This 69 year old gentleman has 70% left main and severe three-vessel coronary disease with recent New York Heart Association class IV symptoms of exertional fatigue and shortness of breath with a 30 pound weight gain due to edema.  He is much improved with diuresis.  2D echocardiogram shows normal left ventricular systolic function with grade 1 diastolic dysfunction.  I agree that coronary bypass graft surgery is the best treatment to prevent repeated episodes of worsening congestive heart failure and progressive left ventricular deterioration.  I reviewed the cardiac catheterization films with the patient and his wife and answered all of their questions.  He is at increased risk for coronary bypass graft surgery due to ongoing heavy smoking, COPD, morbid obesity, diabetes, and stage III chronic kidney disease with a creatinine about 1.8. I discussed the operative procedure with the patient and his wife including alternatives, benefits and risks; including but not limited to bleeding, blood transfusion, infection, stroke, myocardial infarction, graft failure, heart block requiring a permanent pacemaker, organ dysfunction, renal failure requiring dialysis,and death.  Joshua Roy understands and agrees to proceed.  He would like to wait to have surgery until he has his second COVID-19 vaccine injection in early March.  I strongly advised him to decrease his smoking significantly from now until surgery and ideally completely abstain although he has been smoking heavily for many years and that is likely not possible.  Plan:  He will be scheduled for coronary bypass graft surgery on Monday, October 03, 2019.  I spent 60 minutes performing this consultation and > 50% of this time was spent face to face counseling and coordinating the care of this patient's severe multivessel coronary disease.   Joshua Roy  Alveria Apley, MD [facility] [phone]

## 2019-09-19 ENCOUNTER — Encounter: Payer: Medicare Other | Admitting: Cardiothoracic Surgery

## 2019-09-20 ENCOUNTER — Other Ambulatory Visit: Payer: Self-pay | Admitting: Nurse Practitioner

## 2019-09-20 DIAGNOSIS — I739 Peripheral vascular disease, unspecified: Secondary | ICD-10-CM

## 2019-09-21 ENCOUNTER — Other Ambulatory Visit: Payer: Self-pay

## 2019-09-28 DIAGNOSIS — Z20828 Contact with and (suspected) exposure to other viral communicable diseases: Secondary | ICD-10-CM | POA: Diagnosis not present

## 2019-09-28 DIAGNOSIS — Z03818 Encounter for observation for suspected exposure to other biological agents ruled out: Secondary | ICD-10-CM | POA: Diagnosis not present

## 2019-09-28 NOTE — Progress Notes (Signed)
[id number] - [place], [place] - [address] [place] [address] Phone: [phone] Fax: [phone]      Your procedure is scheduled on Monday, March 8. 2021.  Report to [facility] Main Entrance "A" at 5:30 A.M., and check in at the Admitting office.  Call this number if you have problems the morning of surgery:  [phone]  Call [phone] if you have any questions prior to your surgery date Monday-Friday 8am-4pm    Remember:  Do not eat or drink after midnight the night before your surgery    Take these medicines the morning of surgery with A SIP OF WATER: atorvastatin (LIPITOR) Fenofibrate metoprolol tartrate (LOPRESSOR) pantoprazole (PROTONIX)  acetaminophen (TYLENOL) - if needed ALPRAZolam Duanne Moron) - if needed nitroGLYCERIN (NITROSTAT) - if needed  7 days prior to surgery STOP taking clopidogrel (Plavix), any Aspirin (unless otherwise instructed by your surgeon), Aleve, Naproxen, Ibuprofen, Motrin, Advil, Goody's, BC's, all herbal medications, fish oil, and all vitamins.   WHAT DO I DO ABOUT MY DIABETES MEDICATION?   Marland Kitchen Do not take (metFORMIN (GLUCOPHAGE)) the morning of surgery. . Do not take glimepiride (AMARYL) the night before surgery or the morning of surgery.   HOW TO MANAGE YOUR DIABETES BEFORE AND AFTER SURGERY  Why is it important to control my blood sugar before and after surgery? . Improving blood sugar levels before and after surgery helps healing and can limit problems. . A way of improving blood sugar control is eating a healthy diet by: o  Eating less sugar and carbohydrates o  Increasing activity/exercise o  Talking with your doctor about reaching your blood sugar goals . High blood sugars (greater than 180 mg/dL) can raise your risk of infections and slow your recovery, so you will need to focus on controlling your diabetes during the weeks before surgery. . Make sure that the doctor who takes care of your diabetes  knows about your planned surgery including the date and location.  How do I manage my blood sugar before surgery? . Check your blood sugar at least 4 times a day, starting 2 days before surgery, to make sure that the level is not too high or low. . Check your blood sugar the morning of your surgery when you wake up and every 2 hours until you get to the Short Stay unit. o If your blood sugar is less than 70 mg/dL, you will need to treat for low blood sugar: - Do not take insulin. - Treat a low blood sugar (less than 70 mg/dL) with  cup of clear juice (cranberry or apple), 4 glucose tablets, OR glucose gel. - Recheck blood sugar in 15 minutes after treatment (to make sure it is greater than 70 mg/dL). If your blood sugar is not greater than 70 mg/dL on recheck, call [phone] for further instructions. . Report your blood sugar to the short stay nurse when you get to Short Stay.  . If you are admitted to the hospital after surgery: o Your blood sugar will be checked by the staff and you will probably be given insulin after surgery (instead of oral diabetes medicines) to make sure you have good blood sugar levels. o The goal for blood sugar control after surgery is 80-180 mg/dL.   The Morning of Surgery  Do not wear jewelry.  Do not wear lotions, powders, colognes, or deodorant  Men may shave face and neck.  Do not bring valuables to the hospital.  [facility]  is not responsible for any belongings or valuables.  If you are a smoker, DO NOT Smoke 24 hours prior to surgery  If you wear a CPAP at night please bring your mask the morning of surgery   Remember that you must have someone to transport you home after your surgery, and remain with you for 24 hours if you are discharged the same day.   Please bring cases for contacts, glasses, hearing aids, dentures or bridgework because it cannot be worn into surgery.    Leave your suitcase in the car.  After surgery it may be brought to your  room.  For patients admitted to the hospital, discharge time will be determined by your treatment team.  Patients discharged the day of surgery will not be allowed to drive home.    Special instructions:   [facility]- Preparing For Surgery  Before surgery, you can play an important role. Because skin is not sterile, your skin needs to be as free of germs as possible. You can reduce the number of germs on your skin by washing with CHG (chlorahexidine gluconate) Soap before surgery.  CHG is an antiseptic cleaner which kills germs and bonds with the skin to continue killing germs even after washing.    Oral Hygiene is also important to reduce your risk of infection.  Remember - BRUSH YOUR TEETH THE MORNING OF SURGERY WITH YOUR REGULAR TOOTHPASTE  Please do not use if you have an allergy to CHG or antibacterial soaps. If your skin becomes reddened/irritated stop using the CHG.  Do not shave (including legs and underarms) for at least 48 hours prior to first CHG shower. It is OK to shave your face.  Please follow these instructions carefully.   1. Shower the NIGHT BEFORE SURGERY and the MORNING OF SURGERY with CHG Soap.   2. If you chose to wash your hair, wash your hair first as usual with your normal shampoo.  3. After you shampoo, rinse your hair and body thoroughly to remove the shampoo.  4. Use CHG as you would any other liquid soap. You can apply CHG directly to the skin and wash gently with a scrungie or a clean washcloth.   5. Apply the CHG Soap to your body ONLY FROM THE NECK DOWN.  Do not use on open wounds or open sores. Avoid contact with your eyes, ears, mouth and genitals (private parts). Wash Face and genitals (private parts)  with your normal soap.   6. Wash thoroughly, paying special attention to the area where your surgery will be performed.  7. Thoroughly rinse your body with warm water from the neck down.  8. DO NOT shower/wash with your normal soap after using and  rinsing off the CHG Soap.  9. Pat yourself dry with a CLEAN TOWEL.  10. Wear CLEAN PAJAMAS to bed the night before surgery, wear comfortable clothes the morning of surgery  11. Place CLEAN SHEETS on your bed the night of your first shower and DO NOT SLEEP WITH PETS.    Day of Surgery:  Please shower the morning of surgery with the CHG soap Do not apply any deodorants/lotions. Please wear clean clothes to the hospital/surgery center.   Remember to brush your teeth WITH YOUR REGULAR TOOTHPASTE.   Please read over the following fact sheets that you were given.

## 2019-09-29 ENCOUNTER — Ambulatory Visit (HOSPITAL_COMMUNITY): Admission: RE | Admit: 2019-09-29 | Payer: Medicare Other | Source: Ambulatory Visit

## 2019-09-29 ENCOUNTER — Other Ambulatory Visit (HOSPITAL_COMMUNITY): Payer: Medicare Other

## 2019-09-29 ENCOUNTER — Inpatient Hospital Stay (HOSPITAL_COMMUNITY)
Admission: RE | Admit: 2019-09-29 | Discharge: 2019-09-29 | Disposition: A | Discharge status: Home / Self Care | Payer: Medicare Other | Source: Ambulatory Visit

## 2019-09-29 ENCOUNTER — Encounter: Payer: Self-pay | Admitting: *Deleted

## 2019-09-30 ENCOUNTER — Ambulatory Visit: Payer: Medicare Other | Admitting: Cardiovascular Disease

## 2019-10-05 ENCOUNTER — Ambulatory Visit (INDEPENDENT_AMBULATORY_CARE_PROVIDER_SITE_OTHER): Payer: Medicare Other | Admitting: Vascular Surgery

## 2019-10-05 ENCOUNTER — Ambulatory Visit (HOSPITAL_BASED_OUTPATIENT_CLINIC_OR_DEPARTMENT_OTHER)
Admission: RE | Admit: 2019-10-05 | Discharge: 2019-10-05 | Disposition: A | Discharge status: Home / Self Care | Payer: Medicare Other | Source: Ambulatory Visit | Attending: Surgery | Admitting: Surgery

## 2019-10-05 ENCOUNTER — Ambulatory Visit (HOSPITAL_COMMUNITY)
Admission: RE | Admit: 2019-10-05 | Discharge: 2019-10-05 | Disposition: A | Discharge status: Home / Self Care | Payer: Medicare Other | Source: Ambulatory Visit | Attending: Surgery | Admitting: Surgery

## 2019-10-05 ENCOUNTER — Encounter: Payer: Self-pay | Admitting: Vascular Surgery

## 2019-10-05 ENCOUNTER — Other Ambulatory Visit: Payer: Self-pay

## 2019-10-05 ENCOUNTER — Other Ambulatory Visit (HOSPITAL_COMMUNITY)
Admission: RE | Admit: 2019-10-05 | Discharge: 2019-10-05 | Disposition: A | Discharge status: Home / Self Care | Payer: Medicare Other | Source: Ambulatory Visit | Attending: Surgery | Admitting: Surgery

## 2019-10-05 ENCOUNTER — Telehealth: Payer: Self-pay | Admitting: *Deleted

## 2019-10-05 ENCOUNTER — Encounter (HOSPITAL_COMMUNITY): Payer: Self-pay

## 2019-10-05 ENCOUNTER — Encounter (HOSPITAL_COMMUNITY)
Admission: RE | Admit: 2019-10-05 | Discharge: 2019-10-05 | Disposition: A | Discharge status: Home / Self Care | Payer: Medicare Other | Source: Ambulatory Visit | Attending: Surgery | Admitting: Surgery

## 2019-10-05 DIAGNOSIS — Z20822 Contact with and (suspected) exposure to covid-19: Secondary | ICD-10-CM | POA: Diagnosis not present

## 2019-10-05 DIAGNOSIS — E785 Hyperlipidemia, unspecified: Secondary | ICD-10-CM | POA: Diagnosis not present

## 2019-10-05 DIAGNOSIS — Z01818 Encounter for other preprocedural examination: Secondary | ICD-10-CM | POA: Diagnosis not present

## 2019-10-05 DIAGNOSIS — I2510 Atherosclerotic heart disease of native coronary artery without angina pectoris: Secondary | ICD-10-CM

## 2019-10-05 DIAGNOSIS — N189 Chronic kidney disease, unspecified: Secondary | ICD-10-CM | POA: Diagnosis not present

## 2019-10-05 DIAGNOSIS — F419 Anxiety disorder, unspecified: Secondary | ICD-10-CM | POA: Diagnosis not present

## 2019-10-05 DIAGNOSIS — J449 Chronic obstructive pulmonary disease, unspecified: Secondary | ICD-10-CM | POA: Diagnosis not present

## 2019-10-05 DIAGNOSIS — I6521 Occlusion and stenosis of right carotid artery: Secondary | ICD-10-CM

## 2019-10-05 DIAGNOSIS — E119 Type 2 diabetes mellitus without complications: Secondary | ICD-10-CM | POA: Insufficient documentation

## 2019-10-05 DIAGNOSIS — I130 Hypertensive heart and chronic kidney disease with heart failure and stage 1 through stage 4 chronic kidney disease, or unspecified chronic kidney disease: Secondary | ICD-10-CM | POA: Diagnosis not present

## 2019-10-05 DIAGNOSIS — I10 Essential (primary) hypertension: Secondary | ICD-10-CM | POA: Insufficient documentation

## 2019-10-05 DIAGNOSIS — N5089 Other specified disorders of the male genital organs: Secondary | ICD-10-CM | POA: Diagnosis not present

## 2019-10-05 DIAGNOSIS — I5042 Chronic combined systolic (congestive) and diastolic (congestive) heart failure: Secondary | ICD-10-CM | POA: Diagnosis not present

## 2019-10-05 DIAGNOSIS — F17210 Nicotine dependence, cigarettes, uncomplicated: Secondary | ICD-10-CM | POA: Diagnosis not present

## 2019-10-05 DIAGNOSIS — I25110 Atherosclerotic heart disease of native coronary artery with unstable angina pectoris: Secondary | ICD-10-CM | POA: Diagnosis not present

## 2019-10-05 DIAGNOSIS — I252 Old myocardial infarction: Secondary | ICD-10-CM | POA: Insufficient documentation

## 2019-10-05 LAB — COMPREHENSIVE METABOLIC PANEL
ALT: 15 U/L (ref 0–44)
AST: 17 U/L (ref 15–41)
Albumin: 3.6 g/dL (ref 3.5–5.0)
Alkaline Phosphatase: 35 U/L — ABNORMAL LOW (ref 38–126)
Anion gap: 12 (ref 5–15)
BUN: 17 mg/dL (ref 8–23)
CO2: 27 mmol/L (ref 22–32)
Calcium: 9.3 mg/dL (ref 8.9–10.3)
Chloride: 97 mmol/L — ABNORMAL LOW (ref 98–111)
Creatinine, Ser: 1.42 mg/dL — ABNORMAL HIGH (ref 0.61–1.24)
GFR calc Af Amer: 58 mL/min — ABNORMAL LOW (ref >60)
GFR calc non Af Amer: 50 mL/min — ABNORMAL LOW (ref >60)
Glucose, Bld: 173 mg/dL — ABNORMAL HIGH (ref 70–99)
Potassium: 4.2 mmol/L (ref 3.5–5.1)
Sodium: 136 mmol/L (ref 135–145)
Total Bilirubin: 0.7 mg/dL (ref 0.3–1.2)
Total Protein: 6.7 g/dL (ref 6.5–8.1)

## 2019-10-05 LAB — URINALYSIS, ROUTINE W REFLEX MICROSCOPIC
Bilirubin Urine: NEGATIVE
Glucose, UA: NEGATIVE mg/dL
Hgb urine dipstick: NEGATIVE
Ketones, ur: NEGATIVE mg/dL
Leukocytes,Ua: NEGATIVE
Nitrite: NEGATIVE
Protein, ur: NEGATIVE mg/dL
Specific Gravity, Urine: 1.005 (ref 1.005–1.030)
pH: 5 (ref 5.0–8.0)

## 2019-10-05 LAB — CBC
HCT: 49.2 % (ref 39.0–52.0)
Hemoglobin: 14.9 g/dL (ref 13.0–17.0)
MCH: 23.6 pg — ABNORMAL LOW (ref 26.0–34.0)
MCHC: 30.3 g/dL (ref 30.0–36.0)
MCV: 78 fL — ABNORMAL LOW (ref 80.0–100.0)
Platelets: 255 10*3/uL (ref 150–400)
RBC: 6.31 MIL/uL — ABNORMAL HIGH (ref 4.22–5.81)
RDW: 20.5 % — ABNORMAL HIGH (ref 11.5–15.5)
WBC: 8.1 10*3/uL (ref 4.0–10.5)
nRBC: 0 % (ref 0.0–0.2)

## 2019-10-05 LAB — HEMOGLOBIN A1C
Hgb A1c MFr Bld: 8.2 % — ABNORMAL HIGH (ref 4.8–5.6)
Mean Plasma Glucose: 188.64 mg/dL

## 2019-10-05 LAB — BLOOD GAS, ARTERIAL
Acid-Base Excess: 6.7 mmol/L — ABNORMAL HIGH (ref 0.0–2.0)
Bicarbonate: 31.6 mmol/L — ABNORMAL HIGH (ref 20.0–28.0)
Drawn by: 421801
FIO2: 21
O2 Saturation: 91.1 %
Patient temperature: 37
pCO2 arterial: 53.3 mmHg — ABNORMAL HIGH (ref 32.0–48.0)
pH, Arterial: 7.391 (ref 7.350–7.450)
pO2, Arterial: 63.2 mmHg — ABNORMAL LOW (ref 83.0–108.0)

## 2019-10-05 LAB — SURGICAL PCR SCREEN
MRSA, PCR: NEGATIVE
Staphylococcus aureus: POSITIVE — AB

## 2019-10-05 LAB — ABO/RH: ABO/RH(D): O NEG

## 2019-10-05 LAB — TYPE AND SCREEN
ABO/RH(D): O NEG
Antibody Screen: NEGATIVE

## 2019-10-05 LAB — GLUCOSE, CAPILLARY: Glucose-Capillary: 194 mg/dL — ABNORMAL HIGH (ref 70–99)

## 2019-10-05 LAB — PROTIME-INR
INR: 1 (ref 0.8–1.2)
Prothrombin Time: 12.9 seconds (ref 11.4–15.2)

## 2019-10-05 LAB — APTT: aPTT: 26 seconds (ref 24–36)

## 2019-10-05 LAB — SARS CORONAVIRUS 2 (TAT 6-24 HRS): SARS Coronavirus 2: NEGATIVE

## 2019-10-05 NOTE — Telephone Encounter (Signed)
Virtual Visit Pre-Appointment Phone Call  Today, I spoke with Joshua Roy and performed the following actions:  1. I explained that we are currently trying to limit exposure to the COVID-19 virus by seeing patients at home rather than in the office.  I explained that the visits are best done by video, but can be done by telephone.  I asked the patient if a virtual visit that the patient would like to try instead of coming into the office. Joshua Roy agreed to proceed with the virtual visit scheduled with Joshua Roy on 10/05/19.     2. I confirmed the BEST phone number to call the day of the visit and- I included this in appointment notes.  3. I asked if the patient had access to (through a family member/friend) a smartphone with video capability to be used for his visit?"  The patient said yes -    4. I confirmed consent by  a. sending through [facility] or by email the [facility] as written at the end of this message or  b. verbally as listed below. i. This visit is being performed in the setting of COVID-19. ii. All virtual visits are billed to your insurance company just like a normal visit would be.   iii. We'd like you to understand that the technology does not allow for your provider to perform an examination, and thus may limit your provider's ability to fully assess your condition.  iv. If your provider identifies any concerns that need to be evaluated in person, we will make arrangements to do so.   v. Finally, though the technology is pretty good, we cannot assure that it will always work on either your or our end, and in the setting of a video visit, we may have to convert it to a phone-only visit.  In either situation, we cannot ensure that we have a secure connection.   vi. Are you willing to proceed?"  STAFF: Did the patient verbally acknowledge consent to telehealth visit? Document YES/NO here: YES  2. I advised the patient  to be prepared - I asked that the patient, on the day of his visit, record any information possible with the equipment at his home, such as blood pressure, pulse, oxygen saturation, and your weight and write them all down. I asked the patient to have a pen and paper handy nearby the day of the visit as well.  3. If the patient was scheduled for a video visit, I informed the patient that the visit with the doctor would start with a text to the smartphone # given to [place] by the patient.         If the patient was scheduled for a telephone call, I informed the patient that the visit with the doctor would start with a call to the telephone # given to [place] by the patient.  4. I Informed patient they will receive a phone call 15 minutes prior to their appointment time from a [place] or nurse to review medications, allergies, etc. to prepare for the visit.    TELEPHONE CALL NOTE  Joshua Roy has been deemed a candidate for a follow-up tele-health visit to limit community exposure during the Covid-19 pandemic. I spoke with the patient via phone to ensure availability of phone/video source, confirm preferred email & phone number, and discuss instructions and expectations.  I reminded Joshua Roy to be prepared with any vital sign and/or  heart rhythm information that could potentially be obtained via home monitoring, at the time of his visit. I reminded Joshua Roy to expect a phone call prior to his visit.  Joshua Roy, NT 10/05/2019 1:59 PM     FULL LENGTH CONSENT FOR TELE-HEALTH VISIT   I hereby voluntarily request, consent and authorize [facility] and its employed or contracted physicians, physician assistants, nurse practitioners or other licensed health care professionals (the Practitioner), to provide me with telemedicine health care services (the "Services") as deemed necessary by the treating Practitioner. I acknowledge and consent to receive the Services by the  Practitioner via telemedicine. I understand that the telemedicine visit will involve communicating with the Practitioner through live audiovisual communication technology and the disclosure of certain medical information by electronic transmission. I acknowledge that I have been given the opportunity to request an in-person assessment or other available alternative prior to the telemedicine visit and am voluntarily participating in the telemedicine visit.  I understand that I have the right to withhold or withdraw my consent to the use of telemedicine in the course of my care at any time, without affecting my right to future care or treatment, and that the Practitioner or I may terminate the telemedicine visit at any time. I understand that I have the right to inspect all information obtained and/or recorded in the course of the telemedicine visit and may receive copies of available information for a reasonable fee.  I understand that some of the potential risks of receiving the Services via telemedicine include:  Marland Kitchen Delay or interruption in medical evaluation due to technological equipment failure or disruption; . Information transmitted may not be sufficient (e.g. poor resolution of images) to allow for appropriate medical decision making by the Practitioner; and/or  . In rare instances, security protocols could fail, causing a breach of personal health information.  Furthermore, I acknowledge that it is my responsibility to provide information about my medical history, conditions and care that is complete and accurate to the best of my ability. I acknowledge that Practitioner's advice, recommendations, and/or decision may be based on factors not within their control, such as incomplete or inaccurate data provided by me or distortions of diagnostic images or specimens that may result from electronic transmissions. I understand that the practice of medicine is not an exact science and that Practitioner makes  no warranties or guarantees regarding treatment outcomes. I acknowledge that I will receive a copy of this consent concurrently upon execution via email to the email address I last provided but may also request a printed copy by calling the office of [facility].    I understand that my insurance will be billed for this visit.   I have read or had this consent read to me. . I understand the contents of this consent, which adequately explains the benefits and risks of the Services being provided via telemedicine.  . I have been provided ample opportunity to ask questions regarding this consent and the Services and have had my questions answered to my satisfaction. . I give my informed consent for the services to be provided through the use of telemedicine in my medical care  By participating in this telemedicine visit I agree to the above.

## 2019-10-05 NOTE — Progress Notes (Signed)
PCP - Dr. Livingston Diones Cardiologist - Dr. Ida Rogue  PPM/ICD - denies  Chest x-ray - 10/05/2019 EKG - 10/05/2019 Stress Test - 09/08/2019 ECHO - 08/25/2019 Cardiac Cath - 06/13/17  Sleep Study - denies CPAP - N/A STOP BANG score: 7 - results routed to PCP  Fasting Blood Sugar - 110 - 160  Checks Blood Sugar - 1 time in the morning daily  Blood Thinner Instructions: Per patient, last dose 10/02/2019 Aspirin Instructions: N/A  ERAS Protcol - No  COVID TEST-  Scheduled for today 10/05/2019 after PAT appointment. Patient verbalized understanding of self-quarantine instructions.  Anesthesia review: YES, cardiac hx, CAD hx  Patient denies shortness of breath, fever, cough and chest pain at PAT appointment  All instructions explained to the patient, with a verbal understanding of the material. Patient agrees to go over the instructions while at home for a better understanding. Patient also instructed to self quarantine after being tested for COVID-19. The opportunity to ask questions was provided.

## 2019-10-05 NOTE — Progress Notes (Signed)
   10/05/19 0920  OBSTRUCTIVE SLEEP APNEA  Have you ever been diagnosed with sleep apnea through a sleep study? No  Do you snore loudly (loud enough to be heard through closed doors)?  1  Do you often feel tired, fatigued, or sleepy during the daytime (such as falling asleep during driving or talking to someone)? 0  Has anyone observed you stop breathing during your sleep? 1  Do you have, or are you being treated for high blood pressure? 1  BMI more than 35 kg/m2? 1  Age > 50 (1-yes) 1  Neck circumference greater than:Male 16 inches or larger, Male 17inches or larger? 1  Male Gender (Yes=1) 1  Obstructive Sleep Apnea Score 7  Score 5 or greater  Results sent to PCP

## 2019-10-05 NOTE — Progress Notes (Signed)
Pre-CABG Dopplers completed. Refer to "CV Proc" under chart review to view preliminary results.  10/05/2019 9:10 AM Kelby Aline., MHA, RVT, RDCS, RDMS

## 2019-10-05 NOTE — Progress Notes (Addendum)
[id number] - [place], [place] - [address] [place] [address] Phone: [phone] Fax: [phone]    Your procedure is scheduled on Friday, March 12th.  Report to [facility] Main Entrance "A" at 5:30 A.M., and check in at the Admitting office.  Call this number if you have problems the morning of surgery:  [phone]  Call [phone] if you have any questions prior to your surgery date Monday-Friday 8am-4pm   Remember:  Do not eat or drink after midnight the night before your surgery    Take these medicines the morning of surgery with A SIP OF WATER: atorvastatin (LIPITOR) Fenofibrate metoprolol tartrate (LOPRESSOR) pantoprazole (PROTONIX)   nitroGLYCERIN (NITROSTAT) - if needed  If needed - acetaminophen (TYLENOL), ALPRAZolam (XANAX)  Follow your surgeon's instructions on when to stop clopidogrel (PLAVIX).  If no instructions were given by your surgeon then you will need to call the office to get those instructions.    As of today, STOP taking any Aspirin (unless otherwise instructed by your surgeon), Aleve, Naproxen, Ibuprofen, Motrin, Advil, Goody's, BC's, all herbal medications, fish oil, and all vitamins.  WHAT DO I DO ABOUT MY DIABETES MEDICATION?  Marland Kitchen Do not take (metFORMIN (GLUCOPHAGE)) the morning of surgery. . Do not take glimepiride (AMARYL) the morning of surgery.  HOW TO MANAGE YOUR DIABETES BEFORE AND AFTER SURGERY  Why is it important to control my blood sugar before and after surgery? . Improving blood sugar levels before and after surgery helps healing and can limit problems. . A way of improving blood sugar control is eating a healthy diet by: o  Eating less sugar and carbohydrates o  Increasing activity/exercise o  Talking with your doctor about reaching your blood sugar goals . High blood sugars (greater than 180 mg/dL) can raise your risk of infections and slow your recovery, so you will need to focus on controlling  your diabetes during the weeks before surgery. . Make sure that the doctor who takes care of your diabetes knows about your planned surgery including the date and location.  How do I manage my blood sugar before surgery? . Check your blood sugar at least 4 times a day, starting 2 days before surgery, to make sure that the level is not too high or low. . Check your blood sugar the morning of your surgery when you wake up and every 2 hours until you get to the Short Stay unit. o If your blood sugar is less than 70 mg/dL, you will need to treat for low blood sugar: - Do not take insulin. - Treat a low blood sugar (less than 70 mg/dL) with  cup of clear juice (cranberry or apple), 4 glucose tablets, OR glucose gel. - Recheck blood sugar in 15 minutes after treatment (to make sure it is greater than 70 mg/dL). If your blood sugar is not greater than 70 mg/dL on recheck, call [phone] for further instructions. . Report your blood sugar to the short stay nurse when you get to Short Stay.  . If you are admitted to the hospital after surgery: o Your blood sugar will be checked by the staff and you will probably be given insulin after surgery (instead of oral diabetes medicines) to make sure you have good blood sugar levels. o The goal for blood sugar control after surgery is 80-180 mg/dL.   The Morning of Surgery  Do not wear jewelry.  Do not wear lotions, powders, colognes, or deodorant  Men  may shave face and neck.  Do not bring valuables to the hospital.  [facility] is not responsible for any belongings or valuables.  If you are a smoker, DO NOT Smoke 24 hours prior to surgery  If you wear a CPAP at night please bring your mask the morning of surgery   Remember that you must have someone to transport you home after your surgery, and remain with you for 24 hours if you are discharged the same day.   Please bring cases for contacts, glasses, hearing aids, dentures or bridgework because it  cannot be worn into surgery.   Leave your suitcase in the car.  After surgery it may be brought to your room.  For patients admitted to the hospital, discharge time will be determined by your treatment team.  Patients discharged the day of surgery will not be allowed to drive home.   Special instructions:   [place]- Preparing For Surgery  Before surgery, you can play an important role. Because skin is not sterile, your skin needs to be as free of germs as possible. You can reduce the number of germs on your skin by washing with CHG (chlorahexidine gluconate) Soap before surgery.  CHG is an antiseptic cleaner which kills germs and bonds with the skin to continue killing germs even after washing.    Oral Hygiene is also important to reduce your risk of infection.  Remember - BRUSH YOUR TEETH THE MORNING OF SURGERY WITH YOUR REGULAR TOOTHPASTE  Please do not use if you have an allergy to CHG or antibacterial soaps. If your skin becomes reddened/irritated stop using the CHG.  Do not shave (including legs and underarms) for at least 48 hours prior to first CHG shower. It is OK to shave your face.  Please follow these instructions carefully.   1. Shower the NIGHT BEFORE SURGERY and the MORNING OF SURGERY with CHG Soap.   2. If you chose to wash your hair, wash your hair first as usual with your normal shampoo.  3. After you shampoo, rinse your hair and body thoroughly to remove the shampoo.  4. Use CHG as you would any other liquid soap. You can apply CHG directly to the skin and wash gently with a scrungie or a clean washcloth.   5. Apply the CHG Soap to your body ONLY FROM THE NECK DOWN.  Do not use on open wounds or open sores. Avoid contact with your eyes, ears, mouth and genitals (private parts). Wash Face and genitals (private parts)  with your normal soap.   6. Wash thoroughly, paying special attention to the area where your surgery will be performed.  7. Thoroughly rinse your  body with warm water from the neck down.  8. DO NOT shower/wash with your normal soap after using and rinsing off the CHG Soap.  9. Pat yourself dry with a CLEAN TOWEL.  10. Wear CLEAN PAJAMAS to bed the night before surgery, wear comfortable clothes the morning of surgery  11. Place CLEAN SHEETS on your bed the night of your first shower and DO NOT SLEEP WITH PETS.  Day of Surgery: Please shower the morning of surgery with the CHG soap Do not apply any deodorants/lotions. Please wear clean clothes to the hospital/surgery center.   Remember to brush your teeth WITH YOUR REGULAR TOOTHPASTE.  Please read over the following fact sheets that you were given.

## 2019-10-05 NOTE — H&P (View-Only) (Signed)
Virtual Visit via Telephone Note  Referring MD: Joshua Roy  I connected with Joshua Roy on 10/05/2019  by telephone and verified that I was speaking with the correct person using two identifiers. Patient was located at home andI am located at [employer].   The limitations of evaluation and management by telemedicine and the availability of in person appointments have been previously discussed with the patient and are documented in the patients chart. The patient expressed understanding and consented to proceed.  PCP: Joshua Athens, MD Referring MD: Joshua Roy  Chief Complaint: Asymptomatic right carotid stenosis  History of Present Illness: Joshua Roy is a 69 y.o. male with who presented with evaluation of syncopal episode and weight gain.  Work-up at [facility] hospital included cardiac catheterization revealing critical coronary disease.  He was seen by Dr. Cyndia Bent and recommended coronary artery bypass grafting.  He underwent noninvasive peripheral screening today and was found to have critical right carotid stenosis.  He has mild to moderate disease on the left.  The patient denies any prior focal neurologic deficits.  He did have a recent syncopal episode which was nonfocal.  He has no history of peripheral vascular occlusive disease.  Past Medical History:  Diagnosis Date  . (HFpEF) heart failure with preserved ejection fraction ([place])    a. 07/2019 Echo: EF 60-65%. Mod LVH. Gr1 DD. No rwma. Nl RV size/fxn. Mildly dil LA.  Marland Kitchen ACE-inhibitor cough   . Anxiety   . Bronchitis 06/27/2016  . COPD (chronic obstructive pulmonary disease) (HCC)    cath, mild inf. hypokinesis EF 49%, 100% ROA  . Coronary artery disease 2001   a. 2001 Cath: LM 10, LAD 60p/m, D1 30, LCX 20p, RCA 114m/d; b. 05/2017 Ex Mv: Ex time 5:46, antlat twi @ rest, fixed infarct w/ mild peri-infarct ischemia. EF 38%.  . Diabetes mellitus type II   . Hyperlipidemia   . Hypertension   . MI (myocardial  infarction) ([place]) age 64  . OA (osteoarthritis)    knee OA, injected 2012 by ortho  . Pneumonia     Past Surgical History:  Procedure Laterality Date  . CARDIAC CATHETERIZATION  05/06/2000   @ [place]  . COLONOSCOPY WITH PROPOFOL N/A 04/20/2018   Procedure: COLONOSCOPY WITH PROPOFOL;  Surgeon: [name], MD;  Location: [facility] ENDOSCOPY;  Service: Endoscopy;  Laterality: N/A;  . ESOPHAGOGASTRODUODENOSCOPY ENDOSCOPY    . KNEE ARTHROSCOPY  07/25/04   left  . [place]   right, open surgery- repair  . RIGHT/LEFT HEART CATH AND CORONARY ANGIOGRAPHY Bilateral 09/08/2019   Procedure: RIGHT/LEFT HEART CATH AND CORONARY ANGIOGRAPHY;  Surgeon: Minna Merritts, MD;  Location: [place] CV LAB;  Service: Cardiovascular;  Laterality: Bilateral;  . 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 TOTAL KNEE ARTHROPLASTY Right 07/13/2017   Procedure: RIGHT TOTAL KNEE ARTHROPLASTY;  Surgeon: Gaynelle Arabian, MD;  Location: [facility];  Service: Orthopedics;  Laterality: Right;    Current Meds  Medication Sig  . acetaminophen (TYLENOL) 500 MG tablet Take 1,000 mg by mouth every 8 (eight) hours as needed for mild pain or headache.  . ALPRAZolam (XANAX) 0.25 MG tablet Take 0.25 mg by mouth 2 (two) times daily as needed for anxiety.   Marland Kitchen atorvastatin (LIPITOR) 80 MG tablet Take 1 tablet (80 mg total) by mouth daily.  . clopidogrel (PLAVIX) 75 MG  tablet Take 75 mg by mouth daily.  . fenofibrate 160 MG tablet Take 1 tablet (160 mg total) by mouth daily.  . furosemide (LASIX) 40 MG tablet Take 0.5 tablets (20 mg total) by mouth daily. (Patient taking differently: Take 40 mg by mouth 2 (two) times daily. )  . glimepiride (AMARYL) 2 MG tablet Take one half tablet (1 mg) by mouth each morning (Patient taking differently: Take 2 mg by mouth daily with breakfast. )  . glucose  blood (ACCU-CHEK AVIVA PLUS) test strip Use to check blood sugar once daily or as needed.  Diagnosis:  E11.9   Non insulin-dependent.  . Lancets (ACCU-CHEK MULTICLIX) lancets Use as instructed to check blood sugar once daily or as needed.  Diagnosis:  E11.9   Non insulin-dependent.  Marland Kitchen losartan-hydrochlorothiazide (HYZAAR) 50-12.5 MG tablet Take 1 tablet by mouth daily.  . metFORMIN (GLUCOPHAGE) 1000 MG tablet Take 1 tablet (1,000 mg total) by mouth 2 (two) times daily.  . metoprolol tartrate (LOPRESSOR) 25 MG tablet Take 1 tablet (25 mg total) by mouth 2 (two) times daily.  . nicotine (NICODERM CQ - DOSED IN MG/24 HOURS) 21 mg/24hr patch Place 21 mg onto the skin daily as needed (nicotine dependence).   . nitroGLYCERIN (NITROSTAT) 0.4 MG SL tablet Place 1 tablet (0.4 mg total) under the tongue every 5 (five) minutes as needed for chest pain.  . pantoprazole (PROTONIX) 40 MG tablet Take 40 mg by mouth daily before breakfast.  . potassium chloride SA (KLOR-CON) 20 MEQ tablet Take 20 mEq by mouth daily.    12 system ROS was negative unless otherwise noted in HPI   Observations/Objective: Carotid duplex reveals a critical stenosis at the carotid bifurcation on the right.  No stenosis in the left carotid  Assessment and Plan:  Had long discussion with the patient regarding options.  I have recommended endarterectomy for reduction of stroke risk.  I discussed the option of proceeding with coronary bypass grafting with interval right carotid endarterectomy in the future.  I did explain that our practice is been combined carotid endarterectomy and coronary artery bypass grafting in appropriate patients.  I do feel that he is a good candidate for this.  I described the procedure of endarterectomy and Dacron patch angioplasty.  Also discussed the low risk for stroke.  Patient understands and wished to proceed as planned on 10/07/2019    I discussed the assessment and treatment plan with the patient. The  patient was provided an opportunity to ask questions and all were answered. The patient agreed with the plan and demonstrated an understanding of the instructions.   The patient was advised to call back or seek an in-person evaluation if the symptoms worsen or if the condition fails to improve as anticipated.  I spent 15-20 minutes with the patient via telephone encounter.   Annamary Rummage [facility] [place] Office: [phone]  10/05/2019, 2:41 PM

## 2019-10-05 NOTE — Progress Notes (Signed)
Virtual Visit via Telephone Note  Referring MD: Caffie Pinto  I connected with Joshua Roy on 10/05/2019  by telephone and verified that I was speaking with the correct person using two identifiers. Patient was located at home andI am located at [employer].   The limitations of evaluation and management by telemedicine and the availability of in person appointments have been previously discussed with the patient and are documented in the patients chart. The patient expressed understanding and consented to proceed.  PCP: [name], MD Referring MD: Caffie Pinto  Chief Complaint: Asymptomatic right carotid stenosis  History of Present Illness: Joshua Roy is a 69 y.o. male with who presented with evaluation of syncopal episode and weight gain.  Work-up at [facility] hospital included cardiac catheterization revealing critical coronary disease.  He was seen by Dr. Cyndia Bent and recommended coronary artery bypass grafting.  He underwent noninvasive peripheral screening today and was found to have critical right carotid stenosis.  He has mild to moderate disease on the left.  The patient denies any prior focal neurologic deficits.  He did have a recent syncopal episode which was nonfocal.  He has no history of peripheral vascular occlusive disease.  Past Medical History:  Diagnosis Date  . (HFpEF) heart failure with preserved ejection fraction ([place])    a. 07/2019 Echo: EF 60-65%. Mod LVH. Gr1 DD. No rwma. Nl RV size/fxn. Mildly dil LA.  Marland Kitchen ACE-inhibitor cough   . Anxiety   . Bronchitis 06/27/2016  . COPD (chronic obstructive pulmonary disease) (HCC)    cath, mild inf. hypokinesis EF 49%, 100% ROA  . Coronary artery disease 2001   a. 2001 Cath: LM 10, LAD 60p/m, D1 30, LCX 20p, RCA 190m/d; b. 05/2017 Ex Mv: Ex time 5:46, antlat twi @ rest, fixed infarct w/ mild peri-infarct ischemia. EF 38%.  . Diabetes mellitus type II   . Hyperlipidemia   . Hypertension   . MI (myocardial  infarction) ([place]) age 69  . OA (osteoarthritis)    knee OA, injected 2012 by ortho  . Pneumonia     Past Surgical History:  Procedure Laterality Date  . CARDIAC CATHETERIZATION  05/06/2000   @ [place]  . COLONOSCOPY WITH PROPOFOL N/A 04/20/2018   Procedure: COLONOSCOPY WITH PROPOFOL;  Surgeon: [name], MD;  Location[facility];  Service: Endoscopy;  Laterality: N/A;  . ESOPHAGOGASTRODUODENOSCOPY ENDOSCOPY    . KNEE ARTHROSCOPY  07/25/04   left  . [place]   right, open surgery- repair  . RIGHT/LEFT HEART CATH AND CORONARY ANGIOGRAPHY Bilateral 09/08/2019   Procedure: RIGHT/LEFT HEART CATH AND CORONARY ANGIOGRAPHY;  Surgeon: Minna Merritts, MD;  Location: [place] CV LAB;  Service: Cardiovascular;  Laterality: Bilateral;  . 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 TOTAL KNEE ARTHROPLASTY Right 07/13/2017   Procedure: RIGHT TOTAL KNEE ARTHROPLASTY;  Surgeon: Gaynelle Arabian, MD;  Location: [facility];  Service: Orthopedics;  Laterality: Right;    Current Meds  Medication Sig  . acetaminophen (TYLENOL) 500 MG tablet Take 1,000 mg by mouth every 8 (eight) hours as needed for mild pain or headache.  . ALPRAZolam (XANAX) 0.25 MG tablet Take 0.25 mg by mouth 2 (two) times daily as needed for anxiety.   Marland Kitchen atorvastatin (LIPITOR) 80 MG tablet Take 1 tablet (80 mg total) by mouth daily.  . clopidogrel (PLAVIX) 75 MG  tablet Take 75 mg by mouth daily.  . fenofibrate 160 MG tablet Take 1 tablet (160 mg total) by mouth daily.  . furosemide (LASIX) 40 MG tablet Take 0.5 tablets (20 mg total) by mouth daily. (Patient taking differently: Take 40 mg by mouth 2 (two) times daily. )  . glimepiride (AMARYL) 2 MG tablet Take one half tablet (1 mg) by mouth each morning (Patient taking differently: Take 2 mg by mouth daily with breakfast. )  . glucose  blood (ACCU-CHEK AVIVA PLUS) test strip Use to check blood sugar once daily or as needed.  Diagnosis:  E11.9   Non insulin-dependent.  . Lancets (ACCU-CHEK MULTICLIX) lancets Use as instructed to check blood sugar once daily or as needed.  Diagnosis:  E11.9   Non insulin-dependent.  Marland Kitchen losartan-hydrochlorothiazide (HYZAAR) 50-12.5 MG tablet Take 1 tablet by mouth daily.  . metFORMIN (GLUCOPHAGE) 1000 MG tablet Take 1 tablet (1,000 mg total) by mouth 2 (two) times daily.  . metoprolol tartrate (LOPRESSOR) 25 MG tablet Take 1 tablet (25 mg total) by mouth 2 (two) times daily.  . nicotine (NICODERM CQ - DOSED IN MG/24 HOURS) 21 mg/24hr patch Place 21 mg onto the skin daily as needed (nicotine dependence).   . nitroGLYCERIN (NITROSTAT) 0.4 MG SL tablet Place 1 tablet (0.4 mg total) under the tongue every 5 (five) minutes as needed for chest pain.  . pantoprazole (PROTONIX) 40 MG tablet Take 40 mg by mouth daily before breakfast.  . potassium chloride SA (KLOR-CON) 20 MEQ tablet Take 20 mEq by mouth daily.    12 system ROS was negative unless otherwise noted in HPI   Observations/Objective: Carotid duplex reveals a critical stenosis at the carotid bifurcation on the right.  No stenosis in the left carotid  Assessment and Plan:  Had long discussion with the patient regarding options.  I have recommended endarterectomy for reduction of stroke risk.  I discussed the option of proceeding with coronary bypass grafting with interval right carotid endarterectomy in the future.  I did explain that our practice is been combined carotid endarterectomy and coronary artery bypass grafting in appropriate patients.  I do feel that he is a good candidate for this.  I described the procedure of endarterectomy and Dacron patch angioplasty.  Also discussed the low risk for stroke.  Patient understands and wished to proceed as planned on 10/07/2019    I discussed the assessment and treatment plan with the patient. The  patient was provided an opportunity to ask questions and all were answered. The patient agreed with the plan and demonstrated an understanding of the instructions.   The patient was advised to call back or seek an in-person evaluation if the symptoms worsen or if the condition fails to improve as anticipated.  I spent 15-20 minutes with the patient via telephone encounter.   Annamary Rummage [facility] [place] Office: [phone]  10/05/2019, 2:41 PM

## 2019-10-06 MED ORDER — SODIUM CHLORIDE 0.9 % IV SOLN
INTRAVENOUS | Status: DC
  Filled 2019-10-06: qty 30

## 2019-10-06 MED ORDER — NITROGLYCERIN IN D5W 200-5 MCG/ML-% IV SOLN
2.0000 ug/min | INTRAVENOUS | Status: AC
  Administered 2019-10-07: 10 ug/min via INTRAVENOUS
  Filled 2019-10-06: qty 250

## 2019-10-06 MED ORDER — MAGNESIUM SULFATE 50 % IJ SOLN
40.0000 meq | INTRAMUSCULAR | Status: DC
  Filled 2019-10-06: qty 9.85

## 2019-10-06 MED ORDER — PHENYLEPHRINE HCL-NACL 20-0.9 MG/250ML-% IV SOLN
30.0000 ug/min | INTRAVENOUS | Status: AC
  Administered 2019-10-07: 25 ug/min via INTRAVENOUS
  Filled 2019-10-06: qty 250

## 2019-10-06 MED ORDER — DEXMEDETOMIDINE HCL IN NACL 400 MCG/100ML IV SOLN
0.1000 ug/kg/h | INTRAVENOUS | Status: AC
  Administered 2019-10-07: .3 ug/kg/h via INTRAVENOUS
  Filled 2019-10-06: qty 100

## 2019-10-06 MED ORDER — SODIUM CHLORIDE 0.9 % IV SOLN
750.0000 mg | INTRAVENOUS | Status: DC
  Filled 2019-10-06: qty 750

## 2019-10-06 MED ORDER — PLASMA-LYTE 148 IV SOLN
INTRAVENOUS | Status: AC
  Administered 2019-10-07: 500 mL
  Filled 2019-10-06: qty 2.5

## 2019-10-06 MED ORDER — TRANEXAMIC ACID 1000 MG/10ML IV SOLN
1.5000 mg/kg/h | INTRAVENOUS | Status: AC
  Administered 2019-10-07: 1.5 mg/kg/h via INTRAVENOUS
  Filled 2019-10-06: qty 25

## 2019-10-06 MED ORDER — VANCOMYCIN HCL 1500 MG/300ML IV SOLN
1500.0000 mg | INTRAVENOUS | Status: AC
  Administered 2019-10-07: 1500 mg via INTRAVENOUS
  Filled 2019-10-06: qty 300

## 2019-10-06 MED ORDER — MILRINONE LACTATE IN DEXTROSE 20-5 MG/100ML-% IV SOLN
0.3000 ug/kg/min | INTRAVENOUS | Status: DC
  Filled 2019-10-06: qty 100

## 2019-10-06 MED ORDER — EPINEPHRINE HCL 5 MG/250ML IV SOLN IN NS
0.0000 ug/min | INTRAVENOUS | Status: DC
  Filled 2019-10-06: qty 250

## 2019-10-06 MED ORDER — POTASSIUM CHLORIDE 2 MEQ/ML IV SOLN
80.0000 meq | INTRAVENOUS | Status: DC
  Filled 2019-10-06: qty 40

## 2019-10-06 MED ORDER — TRANEXAMIC ACID (OHS) BOLUS VIA INFUSION
15.0000 mg/kg | INTRAVENOUS | Status: AC
  Administered 2019-10-07: 1723.5 mg via INTRAVENOUS
  Filled 2019-10-06: qty 1724

## 2019-10-06 MED ORDER — INSULIN REGULAR(HUMAN) IN NACL 100-0.9 UT/100ML-% IV SOLN
INTRAVENOUS | Status: AC
  Administered 2019-10-07: 1 [IU]/h via INTRAVENOUS
  Filled 2019-10-06: qty 100

## 2019-10-06 MED ORDER — TRANEXAMIC ACID (OHS) PUMP PRIME SOLUTION
2.0000 mg/kg | INTRAVENOUS | Status: DC
  Filled 2019-10-06: qty 2.3

## 2019-10-06 MED ORDER — NOREPINEPHRINE 4 MG/250ML-% IV SOLN
0.0000 ug/min | INTRAVENOUS | Status: DC
  Filled 2019-10-06: qty 250

## 2019-10-06 MED ORDER — SODIUM CHLORIDE 0.9 % IV SOLN
1.5000 g | INTRAVENOUS | Status: AC
  Administered 2019-10-07: .75 g via INTRAVENOUS
  Administered 2019-10-07: 1.5 g via INTRAVENOUS
  Filled 2019-10-06: qty 1.5

## 2019-10-06 NOTE — H&P (Signed)
[address]       [place] [address]             [phone]      Cardiothoracic Surgery Admission History and Physical   PCP is [name], MD  Referring Provider is Minna Merritts, MD      Chief Complaint  Patient presents with  . Coronary Artery Disease       HPI:  The patient is a 69 year old gentleman with history of type 2 diabetes hypertension, hyperlipidemia, longtime 2 pack a day smoker who decreased to 1 pack/day over the past month, bronchitis and COPD and coronary artery disease status post myocardial infarction at age 66. Catheterization at that time showed an occluded mid to distal RCA with otherwise moderate coronary disease. On January 5 he had sudden onset of dizziness and weakness followed by a fall after watching a basketball game and having a drink at a friend's house. His wife picked him up shortly after this and he was witnessed to briefly lose consciousness. He was taken to the [facility] ED. He was noted to have an elevation of his creatinine to 1.75 with a high-sensitivity troponin of 13. He waited in the emergency room for about 3 hours and felt better and decided to leave. The following day he noted beginning of progressive weight gain and said he gained about 25 to 30 pounds over a couple week period. This was associated with increasing dyspnea on exertion, orthopnea, and lower extremity and scrotal edema. He was seen by his primary physician and referred back to the emergency department on January 9 where he was noted to be hypoxemic with saturations in the 80s on room air. His high-sensitivity troponin was 28 and BNP was 401. He had a CT angio of the chest which was negative for pulmonary embolism but did show mild cardiomegaly with coronary vascular calcification and aortic atherosclerosis. Admission was recommended but he declined. He was discharged from the emergency department and his PCP placed him on Lasix 40 mg daily. His weight  began to come down and he was seen in follow-up by cardiology on January 14. A 2D echocardiogram showed normal LV systolic function with grade 1 diastolic dysfunction and no significant valvular disease. He continued to diurese and lost almost 30 pounds of fluid. He continues to have exertional fatigue and shortness of breath. He has not had any chest pain or pressure. He underwent cardiac catheterization on 09/08/2019 which showed a 70% mid to distal left main stenosis. The proximal to mid LAD had 70% stenosis. The left circumflex had an 80% mid vessel stenosis. The proximal RCA had 95% stenosis and was occluded after an acute marginal branch. The posterior descending branch filled by collaterals. Right heart catheterization showed moderate pulmonary hypertension with PA pressure of 67/23 and a mean of 42. Wedge pressure was 43 if accurate. LVEDP was only 10. Cardiac index was 2.13.         Past Medical History:  Diagnosis Date  . (HFpEF) heart failure with preserved ejection fraction ([place])    a. 07/2019 Echo: EF 60-65%. Mod LVH. Gr1 DD. No rwma. Nl RV size/fxn. Mildly dil LA.  Marland Kitchen ACE-inhibitor cough   . Anxiety   . Bronchitis 06/27/2016  . COPD (chronic obstructive pulmonary disease) (HCC)    cath, mild inf. hypokinesis EF 49%, 100% ROA  . Coronary artery disease 2001   a. 2001 Cath: LM 10, LAD 60p/m, D1 30, LCX 20p,  RCA 162m/d; b. 05/2017 Ex Mv: Ex time 5:46, antlat twi @ rest, fixed infarct w/ mild peri-infarct ischemia. EF 38%.  . Diabetes mellitus type II   . Hyperlipidemia   . Hypertension   . MI (myocardial infarction) ([place]) age 55  . OA (osteoarthritis)    knee OA, injected 2012 by ortho  . Pneumonia         Past Surgical History:  Procedure Laterality Date  . CARDIAC CATHETERIZATION  05/06/2000   @ [place]  . COLONOSCOPY WITH PROPOFOL N/A 04/20/2018   Procedure: COLONOSCOPY WITH PROPOFOL; Surgeon: [name], MD; Location: [facility] ENDOSCOPY; Service: Endoscopy; Laterality: N/A;  .  ESOPHAGOGASTRODUODENOSCOPY ENDOSCOPY    . KNEE ARTHROSCOPY  07/25/04   left  . [place]   right, open surgery- repair  . RIGHT/LEFT HEART CATH AND CORONARY ANGIOGRAPHY Bilateral 09/08/2019   Procedure: RIGHT/LEFT HEART CATH AND CORONARY ANGIOGRAPHY; Surgeon: Minna Merritts, MD; Location: [place] CV LAB; Service: Cardiovascular; Laterality: Bilateral;  . TOTAL KNEE ARTHROPLASTY Left 07/14/2016   Procedure: LEFT TOTAL KNEE ARTHROPLASTY; Surgeon: Gaynelle Arabian, MD; Location: [facility] ORS; Service: Orthopedics; Laterality: Left;  . TOTAL KNEE ARTHROPLASTY Right 07/13/2017   Procedure: RIGHT TOTAL KNEE ARTHROPLASTY; Surgeon: Gaynelle Arabian, MD; Location: [facility] ORS; Service: Orthopedics; Laterality: Right;        Family History  Problem Relation Age of Onset  . Heart disease Father    CAD, MI x 3  . Hypertension Father   . Alcohol abuse Father   . Diabetes Father   . Diabetes Sister   . Cancer Brother    lymphoma, in remission  . Colon cancer Brother   . Heart disease Sister    CABG x 3  . Prostate cancer Neg Hx    Social History  Social History        Tobacco Use  . Smoking status: Current Every Day Smoker    Packs/day: 1.00    Years: 40.00    Pack years: 40.00  . Smokeless tobacco: Never Used  Substance Use Topics  . Alcohol use: Yes    Alcohol/week: 0.0 standard drinks    Comment: 5-6 cocktails, 1 times a week  . Drug use: No         Current Outpatient Medications  Medication Sig Dispense Refill  . acetaminophen (TYLENOL) 500 MG tablet Take 1,000 mg by mouth every 8 (eight) hours as needed for mild pain or headache.    . ALPRAZolam (XANAX) 0.25 MG tablet Take 0.25 mg by mouth 2 (two) times daily as needed for anxiety.     Marland Kitchen atorvastatin (LIPITOR) 80 MG tablet Take 1 tablet (80 mg total) by mouth daily. 90 tablet 3  . clopidogrel (PLAVIX) 75 MG tablet Take 75 mg by mouth daily.    . fenofibrate 160 MG tablet Take 1 tablet (160 mg total) by mouth daily. 90  tablet 1  . furosemide (LASIX) 40 MG tablet Take 0.5 tablets (20 mg total) by mouth daily. (Patient taking differently: Take 40 mg by mouth 2 (two) times daily. ) 30 tablet 3  . glimepiride (AMARYL) 2 MG tablet Take one half tablet (1 mg) by mouth each morning (Patient taking differently: Take 2 mg by mouth daily with breakfast. ) 45 tablet 1  . glucose blood (ACCU-CHEK AVIVA PLUS) test strip Use to check blood sugar once daily or as needed. Diagnosis: E11.9 Non insulin-dependent. 100 each 3  . Lancets (ACCU-CHEK MULTICLIX) lancets Use as instructed to check blood sugar once  daily or as needed. Diagnosis: E11.9 Non insulin-dependent. 102 each 3  . losartan-hydrochlorothiazide (HYZAAR) 50-12.5 MG tablet Take 1 tablet by mouth daily.    . metFORMIN (GLUCOPHAGE) 1000 MG tablet Take 1 tablet (1,000 mg total) by mouth 2 (two) times daily. 180 tablet 1  . metoprolol tartrate (LOPRESSOR) 25 MG tablet Take 1 tablet (25 mg total) by mouth 2 (two) times daily. 180 tablet 1  . nicotine (NICODERM CQ - DOSED IN MG/24 HOURS) 21 mg/24hr patch Place 21 mg onto the skin daily as needed (nicotine dependence).     . nitroGLYCERIN (NITROSTAT) 0.4 MG SL tablet Place 1 tablet (0.4 mg total) under the tongue every 5 (five) minutes as needed for chest pain. 25 tablet 3  . potassium chloride SA (KLOR-CON) 20 MEQ tablet Take 20 mEq by mouth daily.     No current facility-administered medications for this visit.        Allergies  Allergen Reactions  . Mucinex D [Pseudoephedrine-Guaifenesin Er]     Elevated BP, insomnia  . Ramipril Cough   Review of Systems  Constitutional: Positive for activity change and fatigue.  HENT: Negative.  Eyes: Negative.  Respiratory: Positive for apnea, cough and shortness of breath.  Cardiovascular: Positive for leg swelling. Negative for chest pain and palpitations.  Gastrointestinal: Negative.  Endocrine: Negative.  Genitourinary: Positive for frequency and scrotal swelling.    Musculoskeletal: Positive for myalgias.  Skin: Negative.  Allergic/Immunologic: Negative.  Neurological: Positive for dizziness and syncope.  Psychiatric/Behavioral:  Depression   BP (!) 150/77 (BP Location: Left Arm, Patient Position: Sitting, Cuff Size: Normal)  Pulse 80  Temp 97.7 F (36.5 C)  Resp 16  Ht 5\' 9"  (1.753 m)  Wt 253 lb (114.8 kg)  SpO2 90%  BMI 37.36 kg/m  Physical Exam  Constitutional:  Appearance: He is obese.  HENT:  Head: Normocephalic and atraumatic.  Eyes:  Extraocular Movements: Extraocular movements intact.  Conjunctiva/sclera: Conjunctivae normal.  Pupils: Pupils are equal, round, and reactive to light.  Neck:  Vascular: No carotid bruit.  Cardiovascular:  Rate and Rhythm: Normal rate and regular rhythm.  Heart sounds: Normal heart sounds. No murmur.  Pulmonary:  Effort: Pulmonary effort is normal.  Breath sounds: Normal breath sounds.  Abdominal:  Tenderness: There is no abdominal tenderness.  Musculoskeletal:  General: Swelling present. Normal range of motion.  Cervical back: Normal range of motion and neck supple.  Lymphadenopathy:  Cervical: No cervical adenopathy.  Skin:  General: Skin is warm and dry.  Neurological:  General: No focal deficit present.  Mental Status: He is alert and oriented to person, place, and time.  Psychiatric:  Mood and Affect: Mood normal.  Behavior: Behavior normal.  Thought Content: Thought content normal.  Judgment: Judgment normal.   Diagnostic Tests:   Patient Name: Joshua Roy Date of Exam: 08/25/2019  Medical Rec #: [id number] Height: 69.0 in  Accession #: [id number] Weight: 270.2 lb  Date of Birth: 14-Feb-1951 BSA: 2.35 m  Patient Age: 44 years BP: 118/70 mmHg  Patient Gender: M HR: 87 bpm.  Exam Location: [place]   Procedure: 2D Echo, Cardiac Doppler, Color Doppler and Intracardiac  Opacification Agent   Indications: I50.20* Unspecified systolic (congestive) heart failure;   R06.02  SOB   History: Patient has no prior history of Echocardiogram  examinations.  CHF, CAD, COPD, Signs/Symptoms:Shortness of Breath and  Syncope;  Risk Factors:Current Smoker, Hypertension, Diabetes and  Dyslipidemia.   Sonographer: Pilar Jarvis RDMS, RVT, RDCS  Referring Phys:  [place] Left ventricular ejection fraction, by visual estimation, is 60 to  65%. The left ventricle has normal function. There is moderately increased  left ventricular hypertrophy.  2. Left ventricular diastolic parameters are consistent with Grade I  diastolic dysfunction (impaired relaxation).  3. The left ventricle has no regional wall motion abnormalities.  4. Global right ventricle has normal systolic function.The right  ventricular size is normal. No increase in right ventricular wall  thickness.  5. Left atrial size was mildly dilated.  6. TR signal is inadequate for assessing pulmonary artery systolic  pressure.   FINDINGS  Left Ventricle: Left ventricular ejection fraction, by visual estimation,  is 60 to 65%. The left ventricle has normal function. The left ventricle  has no regional wall motion abnormalities. There is moderately increased  left ventricular hypertrophy.  Left ventricular diastolic parameters are consistent with Grade I  diastolic dysfunction (impaired relaxation). Normal left atrial pressure.   Right Ventricle: The right ventricular size is normal. No increase in  right ventricular wall thickness. Global RV systolic function is has  normal systolic function.   Left Atrium: Left atrial size was mildly dilated.   Right Atrium: Right atrial size was normal in size   Pericardium: There is no evidence of pericardial effusion.   Mitral Valve: The mitral valve is normal in structure. No evidence of  mitral valve regurgitation. No evidence of mitral valve stenosis by  observation.   Tricuspid Valve: The tricuspid valve is  normal in structure. Tricuspid  valve regurgitation is not demonstrated.   Aortic Valve: The aortic valve was not well visualized. Aortic valve  regurgitation is not visualized. Mild aortic valve sclerosis is present,  with no evidence of aortic valve stenosis. Aortic valve mean gradient  measures 5.0 mmHg. Aortic valve peak  gradient measures 9.2 mmHg. Aortic valve area, by VTI measures 2.59 cm.   Pulmonic Valve: The pulmonic valve was normal in structure. Pulmonic valve  regurgitation is not visualized. Pulmonic regurgitation is not visualized.   Aorta: The aortic root, ascending aorta and aortic arch are all  structurally normal, with no evidence of dilitation or obstruction.   Venous: The inferior vena cava is normal in size with greater than 50%  respiratory variability, suggesting right atrial pressure of 3 mmHg.   IAS/Shunts: No atrial level shunt detected by color flow Doppler. There is  no evidence of a patent foramen ovale. No ventricular septal defect is  seen or detected. There is no evidence of an atrial septal defect.    LEFT VENTRICLE  PLAX 2D  LVIDd: 5.00 cm Diastology  LVIDs: 3.60 cm LV e' lateral: 8.05 cm/s  LV PW: 1.70 cm LV E/e' lateral: 9.7  LV IVS: 1.90 cm LV e' medial: 5.22 cm/s  LVOT diam: 2.30 cm LV E/e' medial: 15.0  LV SV: 64 ml  LV SV Index: 25.55  LVOT Area: 4.15 cm    RIGHT VENTRICLE IVC  RV Basal diam: 3.90 cm IVC diam: 2.10 cm  RV S prime: 14.10 cm/s  TAPSE (M-mode): 2.3 cm   LEFT ATRIUM Index RIGHT ATRIUM Index  LA diam: 4.50 cm 1.92 cm/m RA Area: 21.40 cm  LA Vol (A2C): 60.1 ml 25.60 ml/m RA Volume: 63.30 ml 26.96 ml/m  LA Vol (A4C): 72.7 ml 30.96 ml/m  LA Biplane Vol: 69.8 ml 29.73 ml/m  AORTIC VALVE PULMONIC VALVE  AV Area (Vmax): 2.73 cm PV Vmax: 0.93 m/s  AV Area (  Vmean): 2.54 cm PV Peak grad: 3.4 mmHg  AV Area (VTI): 2.59 cm  AV Vmax: 152.00 cm/s  AV Vmean: 101.000 cm/s  AV VTI: 0.277 m  AV Peak Grad: 9.2 mmHg  AV  Mean Grad: 5.0 mmHg  LVOT Vmax: 100.00 cm/s  LVOT Vmean: 61.800 cm/s  LVOT VTI: 0.173 m  LVOT/AV VTI ratio: 0.62   AORTA  Ao Root diam: 3.10 cm  Ao Asc diam: 3.60 cm   MITRAL VALVE  MV Area (PHT): 3.31 cm SHUNTS  MV PHT: 66.41 msec Systemic VTI: 0.17 m  MV Decel Time: 229 msec Systemic Diam: 2.30 cm  MV E velocity: 78.10 cm/s 103 cm/s  MV A velocity: 91.20 cm/s 70.3 cm/s  MV E/A ratio: 0.86 1.5    Joshua Rogue MD  Electronically signed by Joshua Rogue MD  Signature Date/Time: 08/25/2019/6:37:13 PM  Physicians  Panel Physicians Referring Physician Case Authorizing Physician  Minna Merritts, MD (Primary)       Procedures  RIGHT/LEFT HEART CATH AND CORONARY ANGIOGRAPHY     Conclusion  Mid LM to Dist LM lesion is 70% stenosed.  Ost LM lesion is 40% stenosed.  Prox LAD to Mid LAD lesion is 70% stenosed.  Mid Cx lesion is 80% stenosed.  Ost RCA to Prox RCA lesion is 95% stenosed.  Mid RCA to Dist RCA lesion is 100% stenosed.  RPAV lesion is 90% stenosed.  Hemodynamic findings consistent with moderate pulmonary hypertension.  LV end diastolic pressure is normal.           Indications  Chronic congestive heart failure, unspecified heart failure type ([place]) [I50.9 (ICD-10-CM)]     Procedural Details  Technical Details Cardiac Catheterization Procedure Note  Name: Joshua Roy MRN: [id number] DOB: 1950/12/03  Procedure: Left Heart Cath, Selective Coronary Angiography, LV angiography  Indication:  69 year old male with a history of CAD status post catheterization in 2001 revealing an occluded mid-distal RCA with otherwise moderate CAD, hypertension, hyperlipidemia, type 2 diabetes mellitus, obesity, tobacco abuse, and COPD stress testing in November 2018 in the setting of preoperative evaluation, and this showed prior inferior scar with mild peri-infarct ischemia.  August 02 2019,sudden onset of dizziness and vertiginous symptoms, followed by weakness  and fall. disoriented.  Once he was in car going home from party, he was witnessed to briefly lose consciousness. He remained disoriented and was taken to the [facility] ED.  There, creatinine was elevated above prior baseline at 1.75. H&H were normal. High-sensitivity troponin was 13. After waiting in the waiting room for about 3 hours, he decided to leave since he felt better. progressive weight gain (25 pounds), along with increasing dyspnea exertion, orthopnea, and lower extremity and scrotal edema.   At a follow-up with primary care, he was noted to be volume overloaded and referred to the emergency department on January 12.  hypoxic with saturations in the 80s on room air. BNP was 401. High-sensitivity troponin was 28.  Creatinine was slightly improved at 1.57 with a BUN of 27. CT angio of the chest was negative for PE and showed mild cardiomegaly with coronary vascular calcification and aortic atherosclerosis.  . Admission was recommended however patient declined   echocardiogram, normal LV function and grade 1 diastolic dysfunction. No significant valvular disease was noted. continues to experience dyspnea on exertion after walking about 50 to 100 yards. He has not experienced any chest pain. He has noted significant improvement in abdominal girth and scrotal edema.   bilateral lower extremity/calf heaviness and  claudication after walking about 25 yards.  He was referred for right and left heart cath given unstable anginal sx.   Procedural details: The right groin was prepped, draped, and anesthetized with 1% lidocaine. Using modified Seldinger technique, a 5 French sheath was introduced into the right femoral artery. Standard Judkins catheters (JL 4, JR 4 and pigtail catheter) were used for coronary angiography and left ventriculography. Catheter exchanges were performed over a guidewire. There were no immediate procedural complications. The patient was transferred to the post catheterization  recovery area for further monitoring.  Moderate sedation: 1. Sedation used: 2 mg versed IV, 50 ug fentanyl IV 2. Time of administration: 8:30:AM Time patient left for recovery: 9:15 Am 3. I was Face to Face with the patient during this time: (code: [phone])   Procedural Findings:   Coronary angiography:  Coronary dominance: Right or codominant  Left mainstem: Large vessel that bifurcates into the LAD and left circumflex, mild to moderate ostial left main disease 40%, 70% distal left main disease  Left anterior descending (LAD): Large vessel that extends to the apical region, diagonal branch 2 of moderate size, long region of stenosis estimated 70%  Left circumflex (LCx): Large vessel with OM branch 2, mid left circumflex estimated at 80%  Right coronary artery (RCA): Right dominant vessel with PL and PDA, 80 to 90% proximal stenosis, occluded in the mid vessels with collaterals from left to right  Left ventriculography: Left ventricular systolic function was not measured given renal dysfunction creatinine 1.8, crossed for pressures  No significant aortic valve stenosis   Right heart pressures RA 4 RV 61/7 PA 67/23 mean 42 Wedge pressure 43 Left ventricular end-diastolic pressure 10  Cardiac index 2.13 cardiac output 4.79  Final Conclusions:  Severe three-vessel native coronary disease including left main Elevated right heart pressures consistent with moderate to severe pulmonary hypertension\ Curiously wedge is markedly elevated with appropriate left ventricular end-diastolic pressure, etiology of disconnect is unclear. No significant MR. Recent CT scan with no significant abnormality noted, normal ejection fraction on echo. He has been on Lasix, overdiuresis contributing to worsening renal dysfunction -Severe underlying COPD from long history of smoking   Recommendations:  Case discussed with interventional cardiology, Concerned about distal left main disease Also mid  circumflex, LAD Will refer to CT surgery, also advanced heart failure clinic to assist with right heart pressures --He does not want to stay in the hospital and be transferred to [facility] , with optimization of his condition Prefers to do this as an outpatient   Joshua Roy 09/08/2019, 9:28 AM  Estimated blood loss <50 mL.   During this procedure medications were administered to achieve and maintain moderate conscious sedation while the patient's heart rate, blood pressure, and oxygen saturation were continuously monitored and I was present face-to-face 100% of this time.     Medications  (Filter: Administrations occurring from 09/08/19 0808 to 09/08/19 0920)  midazolam (VERSED) injection (mg)  Total dose: 2 mg  Date/Time   Rate/Dose/Volume Action  09/08/19 0817  1 mg Given  0824  1 mg Given  fentaNYL (SUBLIMAZE) injection (mcg)  Total dose: 50 mcg  Date/Time   Rate/Dose/Volume Action  09/08/19 0817  25 mcg Given  0824  25 mcg Given  iohexol (OMNIPAQUE) 300 MG/ML solution (mL)  Total volume: 80 mL  Date/Time   Rate/Dose/Volume Action  09/08/19 0902  80 mL Given  Heparin (Porcine) in NaCl 1000-0.9 UT/500ML-% SOLN (mL)  Total volume: 500 mL  Date/Time   Rate/Dose/Volume  Action  09/08/19 0908  500 mL Given     Sedation Time  Sedation Time Physician-1: 40 minutes 35 seconds        Contrast  Medication Name Total Dose  iohexol (OMNIPAQUE) 300 MG/ML solution 80 mL     Radiation/Fluoro  Fluoro time: 5.2 (min)  DAP: 31.7 (Gycm2)  Cumulative Air Kerma: 425 (mGy)        Coronary Findings  Diagnostic  Dominance: Right  Left Main  Ost LM lesion 40% stenosed  Ost LM lesion is 40% stenosed.  Mid LM to Dist LM lesion 70% stenosed  Mid LM to Dist LM lesion is 70% stenosed.   Left Anterior Descending  Prox LAD to Mid LAD lesion 70% stenosed  Prox LAD to Mid LAD lesion is 70% stenosed.   Left Circumflex  Mid Cx lesion 80% stenosed  Mid Cx lesion is 80% stenosed.     Right Coronary Artery  Vessel is moderate in size.  Ost RCA to Prox RCA lesion 95% stenosed  Ost RCA to Prox RCA lesion is 95% stenosed.  Mid RCA to Dist RCA lesion 100% stenosed  Mid RCA to Dist RCA lesion is 100% stenosed. The lesion is chronically occluded.   Right Posterior Descending Artery  Vessel is small in size.   Right Posterior Atrioventricular Artery  Vessel is small in size.  RPAV lesion 90% stenosed  RPAV lesion is 90% stenosed.   First Right Posterolateral Branch  Collaterals  1st RPL filled by collaterals from Dist LAD.    Intervention  No interventions have been documented.          Right Heart  Right Heart Pressures Hemodynamic findings consistent with moderate pulmonary hypertension. LV EDP is normal.                    Coronary Diagrams  Diagnostic  Dominance: Right  &&&&&  Intervention       Implants                  Vascular Products  Device Closure Mynxgrip 77f - [id number] - Implanted  Inventory item: DEVICE CLOSURE MYNXGRIP 46F Model/Cat number: [id number]  Manufacturer: [employer] Lot number: [id number]  Device identifier: [id number] Device identifier type: GS1  Area Of Implantation: Groin    GUDID Information   Request status Successful     Brand name: [facility] Version/Model: [id number]   Company name: [employer]. MRI safety info as of 09/08/19: MR Safe   Contains dry or latex rubber: No     GMDN P.T. name: Wound hydrogel dressing, non-antimicrobial     As of 09/08/2019    Status: Implanted                        Syngo Images Link to Procedure Log  Show images for CARDIAC CATHETERIZATION Procedure Log     Images on Long Term Storage   Show images for Joshua Roy, Joshua Roy         Hemo Data (last day) before discharge    AO Systolic Cath Pressure   AO Diastolic Cath Pressure   AO Mean Cath Pressure   LV Systolic Cath Pressure   LV End Diastolic   PA Systolic Cath Pressure   PA Diastolic Cath Pressure    PA Mean Cath Pressure   RA Wedge A Wave   RA Wedge V Wave   RV Systolic Cath Pressure   RV Diastolic Cath Pressure  RV End Diastolic   PCW A Wave   PCW V Wave   PCW Mean   AO O2 Sat   PA O2 Sat   AO O2 Sat   Fick C.O.   Fick C.I.   --  --  --  --  --  --  --  --  8 mmHg  5 mmHg  --  --  --  43 mmHg  46 mmHg  43 mmHg  --  --  --  4.79 L/min  2.13 L/min/m2   --  --  --  --  --  67 mmHg  23 mmHg  42 mmHg  --  --  --  --  --  --  --  --  --  --  --  --  --   --  --  --  --  --  --  --  --  --  --  59 mmHg  0 mmHg  7 mmHg  --  --  --  --  --  --  --  --   --  --  --  --  --  --  --  --  --  --  61 mmHg  0 mmHg  7 mmHg  --  --  --  --  --  --  --  --   113  56 mmHg  78 mmHg  --  --  --  --  --  --  --  --  --  --  --  --  --  --  --  --  --  --   --  --  --  117 mmHg  12 mmHg  --  --  --  --  --  --  --  --  --  --  --  --  --  --  --  --   --  --  --  125 mmHg  17 mmHg  --  --  --  --  --  --  --  --  --  --  --  --  --  --  --  --   --  --  --  118 mmHg  6 mmHg  --  --  --  --  --  --  --  --  --  --  --  --  --  --  --  --   --  --  --  112 mmHg  7 mmHg  --  --  --  --  --  --  --  --  --  --  --  --  --  --  --  --   --  --  --  117 mmHg  10 mmHg  --  --  --  --  --  --  --  --  --  --  --  --  --  --  --  --   115  56 mmHg  80 mmHg  --  --  --  --  --  --  --  --  --  --  --  --  --  --  --  --  --  --   --  --  --  --  --  --  --  --  --  --  --  --  --  --  --  --  90 %  --  SA  --  --   --  --  --  --  --  --  --  --  --  --  --  --  --  Impression:   This 69 year old gentleman has 70% left main and severe three-vessel coronary disease with recent New York Heart Association class IV symptoms of exertional fatigue and shortness of breath with a 30 pound weight gain due to edema. He is much improved with diuresis. 2D echocardiogram shows normal left ventricular systolic function with grade 1 diastolic dysfunction. I agree that coronary bypass graft surgery is the  best treatment to prevent repeated episodes of worsening congestive heart failure and progressive left ventricular deterioration. Preop carotid doppler exam showed a high grade right ICA stenosis. The study was reviewed with Dr. Donnetta Hutching and he plans right carotid endarterectomy at the same time as CABG. I reviewed the cardiac catheterization films with the patient and his wife and answered all of their questions. He is at increased risk for coronary bypass graft surgery due to ongoing heavy smoking, COPD, morbid obesity, diabetes, and stage III chronic kidney disease with a creatinine about 1.8. I discussed the operative procedure with the patient and his wife including alternatives, benefits and risks; including but not limited to bleeding, blood transfusion, infection, stroke, myocardial infarction, graft failure, heart block requiring a permanent pacemaker, organ dysfunction, renal failure requiring dialysis,and death. Joshua Roy understands and agrees to proceed.   Plan:   Coronary artery bypass graft surgery with right carotid endarterectomy by Dr. Curt Jews.  Gaye Pollack, MD  [facility]  [phone]

## 2019-10-06 NOTE — Anesthesia Preprocedure Evaluation (Addendum)
Anesthesia Evaluation  Patient identified by MRN, date of birth, ID band Patient awake    Reviewed: Allergy & Precautions, H&P , NPO status , Patient's Chart, lab work & pertinent test results, reviewed documented beta blocker date and time   Airway Mallampati: II  TM Distance: >3 FB Neck ROM: Full    Dental no notable dental hx. (+) Teeth Intact, Dental Advisory Given   Pulmonary COPD,  COPD inhaler, Current Smoker and Patient abstained from smoking.,    Pulmonary exam normal breath sounds clear to auscultation       Cardiovascular Exercise Tolerance: Good hypertension, Pt. on medications and Pt. on home beta blockers + angina + CAD, + Past MI and + Peripheral Vascular Disease  negative cardio ROS   Rhythm:Regular Rate:Normal     Neuro/Psych Anxiety negative neurological ROS     GI/Hepatic Neg liver ROS, PUD,   Endo/Other  diabetes, Type 2, Oral Hypoglycemic AgentsMorbid obesity  Renal/GU negative Renal ROS  negative genitourinary   Musculoskeletal  (+) Arthritis , Osteoarthritis,    Abdominal   Peds  Hematology negative hematology ROS (+)   Anesthesia Other Findings   Reproductive/Obstetrics negative OB ROS                            Anesthesia Physical Anesthesia Plan  ASA: IV  Anesthesia Plan: General   Post-op Pain Management:    Induction: Intravenous  PONV Risk Score and Plan: 1 and Ondansetron and Midazolam  Airway Management Planned: Oral ETT  Additional Equipment: Arterial line, CVP, PA Cath, TEE and Ultrasound Guidance Line Placement  Intra-op Plan:   Post-operative Plan: Post-operative intubation/ventilation  Informed Consent: I have reviewed the patients History and Physical, chart, labs and discussed the procedure including the risks, benefits and alternatives for the proposed anesthesia with the patient or authorized representative who has indicated his/her  understanding and acceptance.     Dental advisory given  Plan Discussed with: CRNA  Anesthesia Plan Comments:         Anesthesia Quick Evaluation

## 2019-10-07 ENCOUNTER — Inpatient Hospital Stay (HOSPITAL_COMMUNITY): Payer: Medicare Other | Admitting: Physician Assistant

## 2019-10-07 ENCOUNTER — Other Ambulatory Visit: Payer: Self-pay

## 2019-10-07 ENCOUNTER — Inpatient Hospital Stay (HOSPITAL_COMMUNITY): Payer: Medicare Other

## 2019-10-07 ENCOUNTER — Encounter (HOSPITAL_COMMUNITY)
Admission: RE | Disposition: A | Discharge status: Home / Self Care | Payer: Self-pay | Source: Home / Self Care | Attending: Surgery

## 2019-10-07 ENCOUNTER — Encounter (HOSPITAL_COMMUNITY): Payer: Self-pay | Admitting: Surgery

## 2019-10-07 ENCOUNTER — Inpatient Hospital Stay (HOSPITAL_COMMUNITY): Payer: Medicare Other | Admitting: Certified Registered Nurse Anesthetist

## 2019-10-07 ENCOUNTER — Inpatient Hospital Stay (HOSPITAL_COMMUNITY)
Admission: RE | Admit: 2019-10-07 | Discharge: 2019-10-14 | DRG: 236 | Disposition: A | Discharge status: Home / Self Care | Payer: Medicare Other | Attending: Surgery | Admitting: Surgery

## 2019-10-07 DIAGNOSIS — I6521 Occlusion and stenosis of right carotid artery: Secondary | ICD-10-CM | POA: Diagnosis not present

## 2019-10-07 DIAGNOSIS — J9811 Atelectasis: Secondary | ICD-10-CM | POA: Diagnosis not present

## 2019-10-07 DIAGNOSIS — N189 Chronic kidney disease, unspecified: Secondary | ICD-10-CM | POA: Diagnosis present

## 2019-10-07 DIAGNOSIS — I130 Hypertensive heart and chronic kidney disease with heart failure and stage 1 through stage 4 chronic kidney disease, or unspecified chronic kidney disease: Secondary | ICD-10-CM | POA: Diagnosis present

## 2019-10-07 DIAGNOSIS — N5089 Other specified disorders of the male genital organs: Secondary | ICD-10-CM | POA: Diagnosis not present

## 2019-10-07 DIAGNOSIS — I4891 Unspecified atrial fibrillation: Secondary | ICD-10-CM | POA: Diagnosis not present

## 2019-10-07 DIAGNOSIS — I5042 Chronic combined systolic (congestive) and diastolic (congestive) heart failure: Secondary | ICD-10-CM | POA: Diagnosis present

## 2019-10-07 DIAGNOSIS — I252 Old myocardial infarction: Secondary | ICD-10-CM | POA: Diagnosis not present

## 2019-10-07 DIAGNOSIS — I973 Postprocedural hypertension: Secondary | ICD-10-CM | POA: Diagnosis not present

## 2019-10-07 DIAGNOSIS — F17210 Nicotine dependence, cigarettes, uncomplicated: Secondary | ICD-10-CM | POA: Diagnosis not present

## 2019-10-07 DIAGNOSIS — J449 Chronic obstructive pulmonary disease, unspecified: Secondary | ICD-10-CM | POA: Diagnosis present

## 2019-10-07 DIAGNOSIS — F419 Anxiety disorder, unspecified: Secondary | ICD-10-CM | POA: Diagnosis present

## 2019-10-07 DIAGNOSIS — Z4682 Encounter for fitting and adjustment of non-vascular catheter: Secondary | ICD-10-CM | POA: Diagnosis not present

## 2019-10-07 DIAGNOSIS — Z800 Family history of malignant neoplasm of digestive organs: Secondary | ICD-10-CM

## 2019-10-07 DIAGNOSIS — E1122 Type 2 diabetes mellitus with diabetic chronic kidney disease: Secondary | ICD-10-CM | POA: Diagnosis present

## 2019-10-07 DIAGNOSIS — E1151 Type 2 diabetes mellitus with diabetic peripheral angiopathy without gangrene: Secondary | ICD-10-CM | POA: Diagnosis present

## 2019-10-07 DIAGNOSIS — Z951 Presence of aortocoronary bypass graft: Secondary | ICD-10-CM | POA: Diagnosis not present

## 2019-10-07 DIAGNOSIS — Z79899 Other long term (current) drug therapy: Secondary | ICD-10-CM | POA: Diagnosis not present

## 2019-10-07 DIAGNOSIS — I10 Essential (primary) hypertension: Secondary | ICD-10-CM | POA: Diagnosis not present

## 2019-10-07 DIAGNOSIS — Z96653 Presence of artificial knee joint, bilateral: Secondary | ICD-10-CM | POA: Diagnosis present

## 2019-10-07 DIAGNOSIS — Z09 Encounter for follow-up examination after completed treatment for conditions other than malignant neoplasm: Secondary | ICD-10-CM

## 2019-10-07 DIAGNOSIS — Z7902 Long term (current) use of antithrombotics/antiplatelets: Secondary | ICD-10-CM

## 2019-10-07 DIAGNOSIS — Z8249 Family history of ischemic heart disease and other diseases of the circulatory system: Secondary | ICD-10-CM

## 2019-10-07 DIAGNOSIS — I25110 Atherosclerotic heart disease of native coronary artery with unstable angina pectoris: Secondary | ICD-10-CM | POA: Diagnosis not present

## 2019-10-07 DIAGNOSIS — Z20822 Contact with and (suspected) exposure to covid-19: Secondary | ICD-10-CM | POA: Diagnosis present

## 2019-10-07 DIAGNOSIS — Z833 Family history of diabetes mellitus: Secondary | ICD-10-CM

## 2019-10-07 DIAGNOSIS — I2510 Atherosclerotic heart disease of native coronary artery without angina pectoris: Secondary | ICD-10-CM

## 2019-10-07 DIAGNOSIS — Z807 Family history of other malignant neoplasms of lymphoid, hematopoietic and related tissues: Secondary | ICD-10-CM

## 2019-10-07 DIAGNOSIS — E785 Hyperlipidemia, unspecified: Secondary | ICD-10-CM | POA: Diagnosis present

## 2019-10-07 DIAGNOSIS — I358 Other nonrheumatic aortic valve disorders: Secondary | ICD-10-CM | POA: Diagnosis not present

## 2019-10-07 DIAGNOSIS — R918 Other nonspecific abnormal finding of lung field: Secondary | ICD-10-CM | POA: Diagnosis not present

## 2019-10-07 DIAGNOSIS — Z9889 Other specified postprocedural states: Secondary | ICD-10-CM

## 2019-10-07 DIAGNOSIS — Z811 Family history of alcohol abuse and dependence: Secondary | ICD-10-CM

## 2019-10-07 DIAGNOSIS — E1165 Type 2 diabetes mellitus with hyperglycemia: Secondary | ICD-10-CM | POA: Diagnosis present

## 2019-10-07 HISTORY — PX: ENDARTERECTOMY: SHX5162

## 2019-10-07 HISTORY — PX: TEE WITHOUT CARDIOVERSION: SHX5443

## 2019-10-07 HISTORY — PX: CORONARY ARTERY BYPASS GRAFT: SHX141

## 2019-10-07 HISTORY — PX: ENDOVEIN HARVEST OF GREATER SAPHENOUS VEIN: SHX5059

## 2019-10-07 LAB — POCT I-STAT, CHEM 8
BUN: 20 mg/dL (ref 8–23)
BUN: 18 mg/dL (ref 8–23)
BUN: 19 mg/dL (ref 8–23)
BUN: 19 mg/dL (ref 8–23)
BUN: 17 mg/dL (ref 8–23)
Calcium, Ion: 1.17 mmol/L (ref 1.15–1.40)
Calcium, Ion: 1.02 mmol/L — ABNORMAL LOW (ref 1.15–1.40)
Calcium, Ion: 1.17 mmol/L (ref 1.15–1.40)
Calcium, Ion: 1.08 mmol/L — ABNORMAL LOW (ref 1.15–1.40)
Calcium, Ion: 1.09 mmol/L — ABNORMAL LOW (ref 1.15–1.40)
Chloride: 97 mmol/L — ABNORMAL LOW (ref 98–111)
Chloride: 100 mmol/L (ref 98–111)
Chloride: 96 mmol/L — ABNORMAL LOW (ref 98–111)
Chloride: 98 mmol/L (ref 98–111)
Chloride: 101 mmol/L (ref 98–111)
Creatinine, Ser: 1.1 mg/dL (ref 0.61–1.24)
Creatinine, Ser: 1 mg/dL (ref 0.61–1.24)
Creatinine, Ser: 1.1 mg/dL (ref 0.61–1.24)
Creatinine, Ser: 1.1 mg/dL (ref 0.61–1.24)
Creatinine, Ser: 1 mg/dL (ref 0.61–1.24)
Glucose, Bld: 174 mg/dL — ABNORMAL HIGH (ref 70–99)
Glucose, Bld: 124 mg/dL — ABNORMAL HIGH (ref 70–99)
Glucose, Bld: 98 mg/dL (ref 70–99)
Glucose, Bld: 102 mg/dL — ABNORMAL HIGH (ref 70–99)
Glucose, Bld: 120 mg/dL — ABNORMAL HIGH (ref 70–99)
HCT: 43 % (ref 39.0–52.0)
HCT: 35 % — ABNORMAL LOW (ref 39.0–52.0)
HCT: 40 % (ref 39.0–52.0)
HCT: 36 % — ABNORMAL LOW (ref 39.0–52.0)
HCT: 33 % — ABNORMAL LOW (ref 39.0–52.0)
Hemoglobin: 14.6 g/dL (ref 13.0–17.0)
Hemoglobin: 11.9 g/dL — ABNORMAL LOW (ref 13.0–17.0)
Hemoglobin: 13.6 g/dL (ref 13.0–17.0)
Hemoglobin: 12.2 g/dL — ABNORMAL LOW (ref 13.0–17.0)
Hemoglobin: 11.2 g/dL — ABNORMAL LOW (ref 13.0–17.0)
Potassium: 3.6 mmol/L (ref 3.5–5.1)
Potassium: 3.4 mmol/L — ABNORMAL LOW (ref 3.5–5.1)
Potassium: 3.5 mmol/L (ref 3.5–5.1)
Potassium: 4.1 mmol/L (ref 3.5–5.1)
Potassium: 3.1 mmol/L — ABNORMAL LOW (ref 3.5–5.1)
Sodium: 138 mmol/L (ref 135–145)
Sodium: 138 mmol/L (ref 135–145)
Sodium: 140 mmol/L (ref 135–145)
Sodium: 138 mmol/L (ref 135–145)
Sodium: 139 mmol/L (ref 135–145)
TCO2: 31 mmol/L (ref 22–32)
TCO2: 28 mmol/L (ref 22–32)
TCO2: 30 mmol/L (ref 22–32)
TCO2: 30 mmol/L (ref 22–32)
TCO2: 29 mmol/L (ref 22–32)

## 2019-10-07 LAB — CBC
HCT: 35.2 % — ABNORMAL LOW (ref 39.0–52.0)
HCT: 40.1 % (ref 39.0–52.0)
Hemoglobin: 10.7 g/dL — ABNORMAL LOW (ref 13.0–17.0)
Hemoglobin: 12.3 g/dL — ABNORMAL LOW (ref 13.0–17.0)
MCH: 23.8 pg — ABNORMAL LOW (ref 26.0–34.0)
MCH: 23.8 pg — ABNORMAL LOW (ref 26.0–34.0)
MCHC: 30.4 g/dL (ref 30.0–36.0)
MCHC: 30.7 g/dL (ref 30.0–36.0)
MCV: 78.2 fL — ABNORMAL LOW (ref 80.0–100.0)
MCV: 77.6 fL — ABNORMAL LOW (ref 80.0–100.0)
Platelets: 157 10*3/uL (ref 150–400)
Platelets: 219 10*3/uL (ref 150–400)
RBC: 4.5 MIL/uL (ref 4.22–5.81)
RBC: 5.17 MIL/uL (ref 4.22–5.81)
RDW: 19.7 % — ABNORMAL HIGH (ref 11.5–15.5)
RDW: 20.2 % — ABNORMAL HIGH (ref 11.5–15.5)
WBC: 10.4 10*3/uL (ref 4.0–10.5)
WBC: 13.1 10*3/uL — ABNORMAL HIGH (ref 4.0–10.5)
nRBC: 0 % (ref 0.0–0.2)
nRBC: 0 % (ref 0.0–0.2)

## 2019-10-07 LAB — POCT I-STAT 7, (LYTES, BLD GAS, ICA,H+H)
Acid-Base Excess: 4 mmol/L — ABNORMAL HIGH (ref 0.0–2.0)
Acid-Base Excess: 4 mmol/L — ABNORMAL HIGH (ref 0.0–2.0)
Acid-Base Excess: 6 mmol/L — ABNORMAL HIGH (ref 0.0–2.0)
Acid-Base Excess: 5 mmol/L — ABNORMAL HIGH (ref 0.0–2.0)
Acid-Base Excess: 3 mmol/L — ABNORMAL HIGH (ref 0.0–2.0)
Acid-base deficit: 1 mmol/L (ref 0.0–2.0)
Bicarbonate: 33.2 mmol/L — ABNORMAL HIGH (ref 20.0–28.0)
Bicarbonate: 29.4 mmol/L — ABNORMAL HIGH (ref 20.0–28.0)
Bicarbonate: 32.6 mmol/L — ABNORMAL HIGH (ref 20.0–28.0)
Bicarbonate: 29.8 mmol/L — ABNORMAL HIGH (ref 20.0–28.0)
Bicarbonate: 27.8 mmol/L (ref 20.0–28.0)
Bicarbonate: 26.8 mmol/L (ref 20.0–28.0)
Calcium, Ion: 1.21 mmol/L (ref 1.15–1.40)
Calcium, Ion: 1.18 mmol/L (ref 1.15–1.40)
Calcium, Ion: 1.01 mmol/L — ABNORMAL LOW (ref 1.15–1.40)
Calcium, Ion: 1.1 mmol/L — ABNORMAL LOW (ref 1.15–1.40)
Calcium, Ion: 1.11 mmol/L — ABNORMAL LOW (ref 1.15–1.40)
Calcium, Ion: 1.17 mmol/L (ref 1.15–1.40)
HCT: 43 % (ref 39.0–52.0)
HCT: 42 % (ref 39.0–52.0)
HCT: 33 % — ABNORMAL LOW (ref 39.0–52.0)
HCT: 34 % — ABNORMAL LOW (ref 39.0–52.0)
HCT: 33 % — ABNORMAL LOW (ref 39.0–52.0)
HCT: 39 % (ref 39.0–52.0)
Hemoglobin: 14.6 g/dL (ref 13.0–17.0)
Hemoglobin: 14.3 g/dL (ref 13.0–17.0)
Hemoglobin: 11.2 g/dL — ABNORMAL LOW (ref 13.0–17.0)
Hemoglobin: 11.6 g/dL — ABNORMAL LOW (ref 13.0–17.0)
Hemoglobin: 11.2 g/dL — ABNORMAL LOW (ref 13.0–17.0)
Hemoglobin: 13.3 g/dL (ref 13.0–17.0)
O2 Saturation: 100 %
O2 Saturation: 100 %
O2 Saturation: 100 %
O2 Saturation: 100 %
O2 Saturation: 97 %
O2 Saturation: 90 %
Patient temperature: 36.2
Potassium: 3.6 mmol/L (ref 3.5–5.1)
Potassium: 3.4 mmol/L — ABNORMAL LOW (ref 3.5–5.1)
Potassium: 3.5 mmol/L (ref 3.5–5.1)
Potassium: 4.2 mmol/L (ref 3.5–5.1)
Potassium: 3.1 mmol/L — ABNORMAL LOW (ref 3.5–5.1)
Potassium: 3.3 mmol/L — ABNORMAL LOW (ref 3.5–5.1)
Sodium: 139 mmol/L (ref 135–145)
Sodium: 138 mmol/L (ref 135–145)
Sodium: 140 mmol/L (ref 135–145)
Sodium: 138 mmol/L (ref 135–145)
Sodium: 141 mmol/L (ref 135–145)
Sodium: 141 mmol/L (ref 135–145)
TCO2: 35 mmol/L — ABNORMAL HIGH (ref 22–32)
TCO2: 31 mmol/L (ref 22–32)
TCO2: 34 mmol/L — ABNORMAL HIGH (ref 22–32)
TCO2: 31 mmol/L (ref 22–32)
TCO2: 29 mmol/L (ref 22–32)
TCO2: 28 mmol/L (ref 22–32)
pCO2 arterial: 68.9 mmHg (ref 32.0–48.0)
pCO2 arterial: 46.5 mmHg (ref 32.0–48.0)
pCO2 arterial: 54.5 mmHg — ABNORMAL HIGH (ref 32.0–48.0)
pCO2 arterial: 41.9 mmHg (ref 32.0–48.0)
pCO2 arterial: 45.2 mmHg (ref 32.0–48.0)
pCO2 arterial: 53.8 mmHg — ABNORMAL HIGH (ref 32.0–48.0)
pH, Arterial: 7.291 — ABNORMAL LOW (ref 7.350–7.450)
pH, Arterial: 7.41 (ref 7.350–7.450)
pH, Arterial: 7.385 (ref 7.350–7.450)
pH, Arterial: 7.461 — ABNORMAL HIGH (ref 7.350–7.450)
pH, Arterial: 7.397 (ref 7.350–7.450)
pH, Arterial: 7.301 — ABNORMAL LOW (ref 7.350–7.450)
pO2, Arterial: 388 mmHg — ABNORMAL HIGH (ref 83.0–108.0)
pO2, Arterial: 377 mmHg — ABNORMAL HIGH (ref 83.0–108.0)
pO2, Arterial: 327 mmHg — ABNORMAL HIGH (ref 83.0–108.0)
pO2, Arterial: 230 mmHg — ABNORMAL HIGH (ref 83.0–108.0)
pO2, Arterial: 95 mmHg (ref 83.0–108.0)
pO2, Arterial: 64 mmHg — ABNORMAL LOW (ref 83.0–108.0)

## 2019-10-07 LAB — MAGNESIUM: Magnesium: 2.5 mg/dL — ABNORMAL HIGH (ref 1.7–2.4)

## 2019-10-07 LAB — PROTIME-INR
INR: 1.4 — ABNORMAL HIGH (ref 0.8–1.2)
Prothrombin Time: 16.6 seconds — ABNORMAL HIGH (ref 11.4–15.2)

## 2019-10-07 LAB — BASIC METABOLIC PANEL
Anion gap: 9 (ref 5–15)
BUN: 17 mg/dL (ref 8–23)
CO2: 23 mmol/L (ref 22–32)
Calcium: 8 mg/dL — ABNORMAL LOW (ref 8.9–10.3)
Chloride: 105 mmol/L (ref 98–111)
Creatinine, Ser: 1.17 mg/dL (ref 0.61–1.24)
GFR calc Af Amer: >60 mL/min (ref >60)
GFR calc non Af Amer: >60 mL/min (ref >60)
Glucose, Bld: 140 mg/dL — ABNORMAL HIGH (ref 70–99)
Potassium: 4.6 mmol/L (ref 3.5–5.1)
Sodium: 137 mmol/L (ref 135–145)

## 2019-10-07 LAB — HEMOGLOBIN AND HEMATOCRIT, BLOOD
HCT: 35.8 % — ABNORMAL LOW (ref 39.0–52.0)
Hemoglobin: 11.1 g/dL — ABNORMAL LOW (ref 13.0–17.0)

## 2019-10-07 LAB — ECHO INTRAOPERATIVE TEE
Height: 69 in
Weight: 4052.8 oz

## 2019-10-07 LAB — GLUCOSE, CAPILLARY
Glucose-Capillary: 227 mg/dL — ABNORMAL HIGH (ref 70–99)
Glucose-Capillary: 100 mg/dL — ABNORMAL HIGH (ref 70–99)
Glucose-Capillary: 103 mg/dL — ABNORMAL HIGH (ref 70–99)
Glucose-Capillary: 50 mg/dL — ABNORMAL LOW (ref 70–99)
Glucose-Capillary: 99 mg/dL (ref 70–99)
Glucose-Capillary: 101 mg/dL — ABNORMAL HIGH (ref 70–99)
Glucose-Capillary: 115 mg/dL — ABNORMAL HIGH (ref 70–99)
Glucose-Capillary: 115 mg/dL — ABNORMAL HIGH (ref 70–99)
Glucose-Capillary: 127 mg/dL — ABNORMAL HIGH (ref 70–99)

## 2019-10-07 LAB — PLATELET COUNT: Platelets: 174 10*3/uL (ref 150–400)

## 2019-10-07 LAB — APTT: aPTT: 30 seconds (ref 24–36)

## 2019-10-07 SURGERY — ENDARTERECTOMY, CAROTID
Anesthesia: General | Laterality: Right

## 2019-10-07 SURGERY — CORONARY ARTERY BYPASS GRAFTING (CABG)
Anesthesia: General | Site: Leg Upper | Laterality: Right

## 2019-10-07 MED ORDER — PROPOFOL 10 MG/ML IV BOLUS
INTRAVENOUS | Status: DC | PRN
  Administered 2019-10-07 (×2): 50 mg via INTRAVENOUS

## 2019-10-07 MED ORDER — CHLORHEXIDINE GLUCONATE 0.12 % MT SOLN
15.0000 mL | OROMUCOSAL | Status: AC
  Administered 2019-10-07: 15 mL via OROMUCOSAL

## 2019-10-07 MED ORDER — PANTOPRAZOLE SODIUM 40 MG PO TBEC
40.0000 mg | DELAYED_RELEASE_TABLET | Freq: Every day | ORAL | Status: DC
  Administered 2019-10-09 – 2019-10-11 (×3): 40 mg via ORAL
  Filled 2019-10-07 (×3): qty 1

## 2019-10-07 MED ORDER — SODIUM CHLORIDE 0.9% FLUSH
3.0000 mL | Freq: Two times a day (BID) | INTRAVENOUS | Status: DC
  Administered 2019-10-08 – 2019-10-11 (×7): 3 mL via INTRAVENOUS

## 2019-10-07 MED ORDER — FENTANYL CITRATE (PF) 250 MCG/5ML IJ SOLN
INTRAMUSCULAR | Status: AC
  Filled 2019-10-07: qty 20

## 2019-10-07 MED ORDER — MAGNESIUM SULFATE 4 GM/100ML IV SOLN
4.0000 g | Freq: Once | INTRAVENOUS | Status: AC
  Administered 2019-10-07: 4 g via INTRAVENOUS
  Filled 2019-10-07: qty 100

## 2019-10-07 MED ORDER — PHENYLEPHRINE 40 MCG/ML (10ML) SYRINGE FOR IV PUSH (FOR BLOOD PRESSURE SUPPORT)
PREFILLED_SYRINGE | INTRAVENOUS | Status: AC
  Filled 2019-10-07: qty 10

## 2019-10-07 MED ORDER — PROPOFOL 10 MG/ML IV BOLUS
INTRAVENOUS | Status: AC
  Filled 2019-10-07: qty 20

## 2019-10-07 MED ORDER — SODIUM CHLORIDE 0.9 % IV SOLN
INTRAVENOUS | Status: DC | PRN
  Administered 2019-10-07: 500 mL

## 2019-10-07 MED ORDER — DEXMEDETOMIDINE HCL IN NACL 400 MCG/100ML IV SOLN
0.0000 ug/kg/h | INTRAVENOUS | Status: DC
  Administered 2019-10-07: 0 ug/kg/h via INTRAVENOUS
  Administered 2019-10-08 (×2): 0.7 ug/kg/h via INTRAVENOUS
  Filled 2019-10-07 (×3): qty 100

## 2019-10-07 MED ORDER — ACETAMINOPHEN 650 MG RE SUPP
650.0000 mg | Freq: Once | RECTAL | Status: AC
  Administered 2019-10-07: 17:00:00 650 mg via RECTAL

## 2019-10-07 MED ORDER — LACTATED RINGERS IV SOLN: INTRAVENOUS | Status: DC | PRN

## 2019-10-07 MED ORDER — LIDOCAINE HCL (PF) 1 % IJ SOLN
INTRAMUSCULAR | Status: AC
  Filled 2019-10-07: qty 30

## 2019-10-07 MED ORDER — INSULIN REGULAR(HUMAN) IN NACL 100-0.9 UT/100ML-% IV SOLN
INTRAVENOUS | Status: DC
  Administered 2019-10-07: 1.7 [IU]/h via INTRAVENOUS

## 2019-10-07 MED ORDER — MIDAZOLAM HCL (PF) 10 MG/2ML IJ SOLN
INTRAMUSCULAR | Status: AC
  Filled 2019-10-07: qty 2

## 2019-10-07 MED ORDER — ONDANSETRON HCL 4 MG/2ML IJ SOLN
4.0000 mg | Freq: Four times a day (QID) | INTRAMUSCULAR | Status: DC | PRN
  Administered 2019-10-10 – 2019-10-11 (×2): 4 mg via INTRAVENOUS
  Filled 2019-10-07 (×2): qty 2

## 2019-10-07 MED ORDER — ONDANSETRON HCL 4 MG/2ML IJ SOLN
INTRAMUSCULAR | Status: AC
  Filled 2019-10-07: qty 2

## 2019-10-07 MED ORDER — TRANEXAMIC ACID-NACL 1000-0.7 MG/100ML-% IV SOLN
1000.0000 mg | INTRAVENOUS | Status: DC
  Filled 2019-10-07: qty 100

## 2019-10-07 MED ORDER — CEFAZOLIN SODIUM-DEXTROSE 2-4 GM/100ML-% IV SOLN: 2.0000 g | INTRAVENOUS | Status: DC

## 2019-10-07 MED ORDER — CEFAZOLIN SODIUM-DEXTROSE 2-4 GM/100ML-% IV SOLN
INTRAVENOUS | Status: AC
  Filled 2019-10-07: qty 100

## 2019-10-07 MED ORDER — FAMOTIDINE IN NACL 20-0.9 MG/50ML-% IV SOLN
20.0000 mg | Freq: Two times a day (BID) | INTRAVENOUS | Status: AC
  Administered 2019-10-07 (×2): 20 mg via INTRAVENOUS
  Filled 2019-10-07 (×2): qty 50

## 2019-10-07 MED ORDER — HEPARIN SODIUM (PORCINE) 1000 UNIT/ML IJ SOLN
INTRAMUSCULAR | Status: AC
  Filled 2019-10-07: qty 1

## 2019-10-07 MED ORDER — THROMBIN (RECOMBINANT) 20000 UNITS EX SOLR
CUTANEOUS | Status: AC
  Filled 2019-10-07: qty 20000

## 2019-10-07 MED ORDER — FENTANYL CITRATE (PF) 250 MCG/5ML IJ SOLN
INTRAMUSCULAR | Status: DC | PRN
  Administered 2019-10-07: 50 ug via INTRAVENOUS
  Administered 2019-10-07: 25 ug via INTRAVENOUS
  Administered 2019-10-07: 50 ug via INTRAVENOUS
  Administered 2019-10-07: 100 ug via INTRAVENOUS
  Administered 2019-10-07: 50 ug via INTRAVENOUS
  Administered 2019-10-07: 100 ug via INTRAVENOUS
  Administered 2019-10-07: 225 ug via INTRAVENOUS
  Administered 2019-10-07 (×3): 100 ug via INTRAVENOUS
  Administered 2019-10-07: 75 ug via INTRAVENOUS
  Administered 2019-10-07: 200 ug via INTRAVENOUS
  Administered 2019-10-07: 75 ug via INTRAVENOUS
  Administered 2019-10-07 (×2): 50 ug via INTRAVENOUS

## 2019-10-07 MED ORDER — ACETAMINOPHEN 500 MG PO TABS
ORAL_TABLET | ORAL | Status: AC
  Administered 2019-10-07: 1000 mg via ORAL
  Filled 2019-10-07: qty 2

## 2019-10-07 MED ORDER — ONDANSETRON HCL 4 MG/2ML IJ SOLN
INTRAMUSCULAR | Status: DC | PRN
  Administered 2019-10-07: 4 mg via INTRAVENOUS

## 2019-10-07 MED ORDER — PROTAMINE SULFATE 10 MG/ML IV SOLN
INTRAVENOUS | Status: DC | PRN
  Administered 2019-10-07: 390 mg via INTRAVENOUS
  Administered 2019-10-07: 10 mg via INTRAVENOUS

## 2019-10-07 MED ORDER — CHLORHEXIDINE GLUCONATE 0.12 % MT SOLN: 15.0000 mL | Freq: Once | OROMUCOSAL | Status: AC

## 2019-10-07 MED ORDER — MORPHINE SULFATE (PF) 2 MG/ML IV SOLN
1.0000 mg | INTRAVENOUS | Status: DC | PRN
  Administered 2019-10-08: 2 mg via INTRAVENOUS
  Filled 2019-10-07: qty 1

## 2019-10-07 MED ORDER — DEXTROSE 50 % IV SOLN: 0.0000 mL | INTRAVENOUS | Status: DC | PRN

## 2019-10-07 MED ORDER — ACETAMINOPHEN 160 MG/5ML PO SOLN: 650.0000 mg | Freq: Once | ORAL | Status: AC

## 2019-10-07 MED ORDER — CHLORHEXIDINE GLUCONATE 0.12 % MT SOLN
OROMUCOSAL | Status: AC
  Administered 2019-10-07: 15 mL via OROMUCOSAL
  Filled 2019-10-07: qty 15

## 2019-10-07 MED ORDER — NITROGLYCERIN IN D5W 200-5 MCG/ML-% IV SOLN: 0.0000 ug/min | INTRAVENOUS | Status: DC

## 2019-10-07 MED ORDER — SODIUM CHLORIDE 0.9 % IV SOLN: INTRAVENOUS | Status: DC

## 2019-10-07 MED ORDER — CHLORHEXIDINE GLUCONATE CLOTH 2 % EX PADS: 6.0000 | MEDICATED_PAD | Freq: Once | CUTANEOUS | Status: DC

## 2019-10-07 MED ORDER — THROMBIN 20000 UNITS EX SOLR
CUTANEOUS | Status: DC | PRN
  Administered 2019-10-07: 20000 [IU] via TOPICAL

## 2019-10-07 MED ORDER — HEPARIN SODIUM (PORCINE) 1000 UNIT/ML IJ SOLN
INTRAMUSCULAR | Status: AC
  Filled 2019-10-07: qty 2

## 2019-10-07 MED ORDER — LEVALBUTEROL TARTRATE 45 MCG/ACT IN AERO
2.0000 | INHALATION_SPRAY | Freq: Four times a day (QID) | RESPIRATORY_TRACT | Status: DC
  Filled 2019-10-07: qty 15

## 2019-10-07 MED ORDER — ALBUMIN HUMAN 5 % IV SOLN: INTRAVENOUS | Status: DC | PRN

## 2019-10-07 MED ORDER — DOCUSATE SODIUM 100 MG PO CAPS
200.0000 mg | ORAL_CAPSULE | Freq: Every day | ORAL | Status: DC
  Administered 2019-10-08 – 2019-10-11 (×4): 200 mg via ORAL
  Filled 2019-10-07 (×4): qty 2

## 2019-10-07 MED ORDER — MIDAZOLAM HCL 5 MG/5ML IJ SOLN
INTRAMUSCULAR | Status: DC | PRN
  Administered 2019-10-07 (×2): 3 mg via INTRAVENOUS
  Administered 2019-10-07 (×2): 2 mg via INTRAVENOUS

## 2019-10-07 MED ORDER — LACTATED RINGERS IV SOLN: 500.0000 mL | Freq: Once | INTRAVENOUS | Status: DC | PRN

## 2019-10-07 MED ORDER — SODIUM CHLORIDE 0.9% FLUSH: 3.0000 mL | INTRAVENOUS | Status: DC | PRN

## 2019-10-07 MED ORDER — METOPROLOL TARTRATE 12.5 MG HALF TABLET
12.5000 mg | ORAL_TABLET | Freq: Two times a day (BID) | ORAL | Status: DC
  Administered 2019-10-08 – 2019-10-09 (×4): 12.5 mg via ORAL
  Filled 2019-10-07 (×4): qty 1

## 2019-10-07 MED ORDER — CHLORHEXIDINE GLUCONATE 0.12% ORAL RINSE (MEDLINE KIT)
15.0000 mL | Freq: Two times a day (BID) | OROMUCOSAL | Status: DC
  Administered 2019-10-07 – 2019-10-08 (×2): 15 mL via OROMUCOSAL

## 2019-10-07 MED ORDER — ASPIRIN EC 325 MG PO TBEC
325.0000 mg | DELAYED_RELEASE_TABLET | Freq: Every day | ORAL | Status: DC
  Administered 2019-10-08 – 2019-10-11 (×4): 325 mg via ORAL
  Filled 2019-10-07 (×4): qty 1

## 2019-10-07 MED ORDER — BISACODYL 5 MG PO TBEC
10.0000 mg | DELAYED_RELEASE_TABLET | Freq: Every day | ORAL | Status: DC
  Administered 2019-10-08 – 2019-10-11 (×4): 10 mg via ORAL
  Filled 2019-10-07 (×4): qty 2

## 2019-10-07 MED ORDER — METOPROLOL TARTRATE 12.5 MG HALF TABLET: 12.5000 mg | ORAL_TABLET | Freq: Once | ORAL | Status: DC

## 2019-10-07 MED ORDER — CHLORHEXIDINE GLUCONATE 4 % EX LIQD: 30.0000 mL | CUTANEOUS | Status: DC

## 2019-10-07 MED ORDER — VANCOMYCIN HCL IN DEXTROSE 1-5 GM/200ML-% IV SOLN
1000.0000 mg | Freq: Once | INTRAVENOUS | Status: AC
  Administered 2019-10-07: 21:00:00 1000 mg via INTRAVENOUS
  Filled 2019-10-07: qty 200

## 2019-10-07 MED ORDER — ACETAMINOPHEN 500 MG PO TABS: 1000.0000 mg | ORAL_TABLET | Freq: Once | ORAL | Status: AC

## 2019-10-07 MED ORDER — METOPROLOL TARTRATE 12.5 MG HALF TABLET
ORAL_TABLET | ORAL | Status: AC
  Filled 2019-10-07: qty 1

## 2019-10-07 MED ORDER — CHLORHEXIDINE GLUCONATE CLOTH 2 % EX PADS
6.0000 | MEDICATED_PAD | Freq: Every day | CUTANEOUS | Status: DC
  Administered 2019-10-07: 6 via TOPICAL

## 2019-10-07 MED ORDER — PHENYLEPHRINE HCL-NACL 20-0.9 MG/250ML-% IV SOLN
0.0000 ug/min | INTRAVENOUS | Status: DC
  Administered 2019-10-07: 10 ug/min via INTRAVENOUS

## 2019-10-07 MED ORDER — ACETAMINOPHEN 160 MG/5ML PO SOLN
1000.0000 mg | Freq: Four times a day (QID) | ORAL | Status: DC
  Administered 2019-10-07 – 2019-10-08 (×2): 1000 mg
  Filled 2019-10-07 (×2): qty 40.6

## 2019-10-07 MED ORDER — 0.9 % SODIUM CHLORIDE (POUR BTL) OPTIME
TOPICAL | Status: DC | PRN
  Administered 2019-10-07: 5000 mL

## 2019-10-07 MED ORDER — HEPARIN SODIUM (PORCINE) 1000 UNIT/ML IJ SOLN
INTRAMUSCULAR | Status: DC | PRN
  Administered 2019-10-07: 11000 [IU] via INTRAVENOUS
  Administered 2019-10-07: 30000 [IU] via INTRAVENOUS

## 2019-10-07 MED ORDER — POTASSIUM CHLORIDE 10 MEQ/50ML IV SOLN
10.0000 meq | INTRAVENOUS | Status: AC
  Administered 2019-10-07 (×3): 10 meq via INTRAVENOUS

## 2019-10-07 MED ORDER — ALBUMIN HUMAN 5 % IV SOLN
250.0000 mL | INTRAVENOUS | Status: AC | PRN
  Administered 2019-10-07 – 2019-10-08 (×2): 12.5 g via INTRAVENOUS
  Filled 2019-10-07: qty 250

## 2019-10-07 MED ORDER — ORAL CARE MOUTH RINSE
15.0000 mL | OROMUCOSAL | Status: DC
  Administered 2019-10-07 – 2019-10-08 (×5): 15 mL via OROMUCOSAL

## 2019-10-07 MED ORDER — MIDAZOLAM HCL 2 MG/2ML IJ SOLN: 2.0000 mg | INTRAMUSCULAR | Status: DC | PRN

## 2019-10-07 MED ORDER — LEVALBUTEROL TARTRATE 45 MCG/ACT IN AERO: 2.0000 | INHALATION_SPRAY | Freq: Four times a day (QID) | RESPIRATORY_TRACT | Status: DC

## 2019-10-07 MED ORDER — FENTANYL CITRATE (PF) 250 MCG/5ML IJ SOLN
INTRAMUSCULAR | Status: AC
  Filled 2019-10-07: qty 5

## 2019-10-07 MED ORDER — BISACODYL 10 MG RE SUPP: 10.0000 mg | Freq: Every day | RECTAL | Status: DC

## 2019-10-07 MED ORDER — SODIUM CHLORIDE 0.9 % IV SOLN
INTRAVENOUS | Status: AC
  Filled 2019-10-07: qty 1.2

## 2019-10-07 MED ORDER — SODIUM CHLORIDE 0.9 % IV SOLN
1.5000 g | Freq: Two times a day (BID) | INTRAVENOUS | Status: AC
  Administered 2019-10-07 – 2019-10-09 (×4): 1.5 g via INTRAVENOUS
  Filled 2019-10-07 (×4): qty 1.5

## 2019-10-07 MED ORDER — ACETAMINOPHEN 500 MG PO TABS
1000.0000 mg | ORAL_TABLET | Freq: Four times a day (QID) | ORAL | Status: DC
  Administered 2019-10-08 – 2019-10-11 (×12): 1000 mg via ORAL
  Filled 2019-10-07 (×12): qty 2

## 2019-10-07 MED ORDER — OXYCODONE HCL 5 MG PO TABS
5.0000 mg | ORAL_TABLET | ORAL | Status: DC | PRN
  Administered 2019-10-08 – 2019-10-10 (×7): 10 mg via ORAL
  Filled 2019-10-07 (×7): qty 2

## 2019-10-07 MED ORDER — TRAMADOL HCL 50 MG PO TABS: 50.0000 mg | ORAL_TABLET | ORAL | Status: DC | PRN

## 2019-10-07 MED ORDER — ROCURONIUM BROMIDE 10 MG/ML (PF) SYRINGE
PREFILLED_SYRINGE | INTRAVENOUS | Status: DC | PRN
  Administered 2019-10-07: 50 mg via INTRAVENOUS
  Administered 2019-10-07: 100 mg via INTRAVENOUS
  Administered 2019-10-07: 50 mg via INTRAVENOUS
  Administered 2019-10-07: 20 mg via INTRAVENOUS
  Administered 2019-10-07: 100 mg via INTRAVENOUS

## 2019-10-07 MED ORDER — HEMOSTATIC AGENTS (NO CHARGE) OPTIME
TOPICAL | Status: DC | PRN
  Administered 2019-10-07 (×5): 1 via TOPICAL

## 2019-10-07 MED ORDER — LACTATED RINGERS IV SOLN
INTRAVENOUS | Status: DC
  Administered 2019-10-07: 20 mL/h via INTRAVENOUS

## 2019-10-07 MED ORDER — METOPROLOL TARTRATE 5 MG/5ML IV SOLN: 2.5000 mg | INTRAVENOUS | Status: DC | PRN

## 2019-10-07 MED ORDER — ROCURONIUM BROMIDE 10 MG/ML (PF) SYRINGE
PREFILLED_SYRINGE | INTRAVENOUS | Status: AC
  Filled 2019-10-07: qty 30

## 2019-10-07 MED ORDER — LACTATED RINGERS IV SOLN: INTRAVENOUS | Status: DC

## 2019-10-07 MED ORDER — ASPIRIN 81 MG PO CHEW
324.0000 mg | CHEWABLE_TABLET | Freq: Every day | ORAL | Status: DC
  Filled 2019-10-07: qty 4

## 2019-10-07 MED ORDER — SODIUM CHLORIDE 0.9 % IV SOLN: 250.0000 mL | INTRAVENOUS | Status: DC

## 2019-10-07 MED ORDER — ATORVASTATIN CALCIUM 80 MG PO TABS
80.0000 mg | ORAL_TABLET | Freq: Every day | ORAL | Status: DC
  Administered 2019-10-08 – 2019-10-12 (×5): 80 mg via ORAL
  Filled 2019-10-07 (×6): qty 1

## 2019-10-07 MED ORDER — METOPROLOL TARTRATE 25 MG/10 ML ORAL SUSPENSION: 12.5000 mg | Freq: Two times a day (BID) | ORAL | Status: DC

## 2019-10-07 MED ORDER — SODIUM CHLORIDE 0.45 % IV SOLN: INTRAVENOUS | Status: DC | PRN

## 2019-10-07 MED ORDER — DEXMEDETOMIDINE HCL IN NACL 200 MCG/50ML IV SOLN
INTRAVENOUS | Status: AC
  Filled 2019-10-07: qty 50

## 2019-10-07 MED ORDER — LEVALBUTEROL HCL 0.63 MG/3ML IN NEBU: 0.6300 mg | INHALATION_SOLUTION | Freq: Three times a day (TID) | RESPIRATORY_TRACT | Status: DC | PRN

## 2019-10-07 MED ORDER — ROCURONIUM BROMIDE 10 MG/ML (PF) SYRINGE
PREFILLED_SYRINGE | INTRAVENOUS | Status: AC
  Filled 2019-10-07: qty 10

## 2019-10-07 SURGICAL SUPPLY — 144 items
BAG DECANTER FOR FLEXI CONT (MISCELLANEOUS) ×6 IMPLANT
BASKET HEART (ORDER IN 25'S) (MISCELLANEOUS) ×5
BASKET HEART (ORDER IN 25S) (MISCELLANEOUS) ×4 IMPLANT
BLADE CLIPPER SURG (BLADE) ×5 IMPLANT
BLADE STERNUM SYSTEM 6 (BLADE) ×5 IMPLANT
BLADE SURG 11 STRL SS (BLADE) ×1 IMPLANT
BNDG ELASTIC 4X5.8 VLCR STR LF (GAUZE/BANDAGES/DRESSINGS) ×5 IMPLANT
BNDG ELASTIC 6X5.8 VLCR STR LF (GAUZE/BANDAGES/DRESSINGS) ×5 IMPLANT
BNDG GAUZE ELAST 4 BULKY (GAUZE/BANDAGES/DRESSINGS) ×5 IMPLANT
BOOT SUTURE AID YELLOW STND (SUTURE) ×1 IMPLANT
CANISTER SUCT 3000ML PPV (MISCELLANEOUS) ×9 IMPLANT
CANNULA ARTERIAL NVNT 3/8 22FR (MISCELLANEOUS) ×1 IMPLANT
CANNULA VESSEL 3MM 2 BLNT TIP (CANNULA) ×9 IMPLANT
CATH ROBINSON RED A/P 18FR (CATHETERS) ×15 IMPLANT
CATH THORACIC 28FR (CATHETERS) ×5 IMPLANT
CATH THORACIC 36FR (CATHETERS) ×5 IMPLANT
CATH THORACIC 36FR RT ANG (CATHETERS) ×5 IMPLANT
CLIP LIGATING EXTRA MED SLVR (CLIP) ×5 IMPLANT
CLIP LIGATING EXTRA SM BLUE (MISCELLANEOUS) ×5 IMPLANT
CLIP VESOCCLUDE MED 24/CT (CLIP) IMPLANT
CLIP VESOCCLUDE SM WIDE 24/CT (CLIP) ×1 IMPLANT
COVER WAND RF STERILE (DRAPES) ×4 IMPLANT
DECANTER SPIKE VIAL GLASS SM (MISCELLANEOUS) IMPLANT
DERMABOND ADVANCED (GAUZE/BANDAGES/DRESSINGS) ×1
DERMABOND ADVANCED .7 DNX12 (GAUZE/BANDAGES/DRESSINGS) ×4 IMPLANT
DRAIN HEMOVAC 1/8 X 5 (WOUND CARE) IMPLANT
DRAPE CARDIOVASCULAR INCISE (DRAPES) ×5
DRAPE INCISE IOBAN 66X45 STRL (DRAPES) ×1 IMPLANT
DRAPE SLUSH/WARMER DISC (DRAPES) ×5 IMPLANT
DRAPE SRG 135X102X78XABS (DRAPES) ×4 IMPLANT
DRSG COVADERM 4X14 (GAUZE/BANDAGES/DRESSINGS) ×5 IMPLANT
DRSG COVADERM 4X8 (GAUZE/BANDAGES/DRESSINGS) ×1 IMPLANT
ELECT BLADE 4.0 EZ CLEAN MEGAD (MISCELLANEOUS) ×5
ELECT CAUTERY BLADE 6.4 (BLADE) ×5 IMPLANT
ELECT REM PT RETURN 9FT ADLT (ELECTROSURGICAL) ×10
ELECTRODE BLDE 4.0 EZ CLN MEGD (MISCELLANEOUS) IMPLANT
ELECTRODE REM PT RTRN 9FT ADLT (ELECTROSURGICAL) ×12 IMPLANT
EVACUATOR SILICONE 100CC (DRAIN) IMPLANT
FELT TEFLON 1X6 (MISCELLANEOUS) ×9 IMPLANT
GAUZE SPONGE 4X4 12PLY STRL (GAUZE/BANDAGES/DRESSINGS) ×10 IMPLANT
GAUZE SPONGE 4X4 12PLY STRL LF (GAUZE/BANDAGES/DRESSINGS) ×2 IMPLANT
GLOVE BIO SURGEON STRL SZ 6 (GLOVE) ×3 IMPLANT
GLOVE BIO SURGEON STRL SZ 6.5 (GLOVE) ×4 IMPLANT
GLOVE BIO SURGEON STRL SZ7 (GLOVE) IMPLANT
GLOVE BIO SURGEON STRL SZ7.5 (GLOVE) ×1 IMPLANT
GLOVE BIOGEL PI IND STRL 6 (GLOVE) IMPLANT
GLOVE BIOGEL PI IND STRL 6.5 (GLOVE) IMPLANT
GLOVE BIOGEL PI IND STRL 7.0 (GLOVE) IMPLANT
GLOVE BIOGEL PI IND STRL 7.5 (GLOVE) IMPLANT
GLOVE BIOGEL PI IND STRL 9 (GLOVE) IMPLANT
GLOVE BIOGEL PI INDICATOR 6 (GLOVE) ×1
GLOVE BIOGEL PI INDICATOR 6.5 (GLOVE) ×7
GLOVE BIOGEL PI INDICATOR 7.0 (GLOVE) ×1
GLOVE BIOGEL PI INDICATOR 7.5 (GLOVE) ×2
GLOVE BIOGEL PI INDICATOR 9 (GLOVE) ×1
GLOVE ORTHO TXT STRL SZ7.5 (GLOVE) IMPLANT
GLOVE SS BIOGEL STRL SZ 7 (GLOVE) ×8 IMPLANT
GLOVE SS BIOGEL STRL SZ 7.5 (GLOVE) ×4 IMPLANT
GLOVE SUPERSENSE BIOGEL SZ 7 (GLOVE) ×2
GLOVE SUPERSENSE BIOGEL SZ 7.5 (GLOVE) ×2
GOWN STRL REUS W/ TWL LRG LVL3 (GOWN DISPOSABLE) ×28 IMPLANT
GOWN STRL REUS W/ TWL XL LVL3 (GOWN DISPOSABLE) ×4 IMPLANT
GOWN STRL REUS W/TWL LRG LVL3 (GOWN DISPOSABLE) ×50
GOWN STRL REUS W/TWL XL LVL3 (GOWN DISPOSABLE) ×5
HEMOSTAT POWDER SURGIFOAM 1G (HEMOSTASIS) ×3 IMPLANT
HEMOSTAT SURGICEL 2X14 (HEMOSTASIS) ×2 IMPLANT
INSERT FOGARTY 61MM (MISCELLANEOUS) IMPLANT
INSERT FOGARTY SM (MISCELLANEOUS) ×2 IMPLANT
INSERT FOGARTY XLG (MISCELLANEOUS) ×1 IMPLANT
KIT BASIN OR (CUSTOM PROCEDURE TRAY) ×9 IMPLANT
KIT CATH CPB BARTLE (MISCELLANEOUS) ×5 IMPLANT
KIT SHUNT ARGYLE CAROTID ART 6 (VASCULAR PRODUCTS) IMPLANT
KIT SUCTION CATH 14FR (SUCTIONS) ×5 IMPLANT
KIT TURNOVER KIT B (KITS) ×9 IMPLANT
KIT VASOVIEW HEMOPRO 2 VH 4000 (KITS) ×5 IMPLANT
LOOP VESSEL MAXI BLUE (MISCELLANEOUS) ×1 IMPLANT
LOOP VESSEL MINI RED (MISCELLANEOUS) ×1 IMPLANT
NEEDLE 22X1 1/2 (OR ONLY) (NEEDLE) IMPLANT
NS IRRIG 1000ML POUR BTL (IV SOLUTION) ×5 IMPLANT
PACK CAROTID (CUSTOM PROCEDURE TRAY) ×4 IMPLANT
PACK E OPEN HEART (SUTURE) ×5 IMPLANT
PACK OPEN HEART (CUSTOM PROCEDURE TRAY) ×5 IMPLANT
PAD ARMBOARD 7.5X6 YLW CONV (MISCELLANEOUS) ×18 IMPLANT
PAD ELECT DEFIB RADIOL ZOLL (MISCELLANEOUS) ×5 IMPLANT
PATCH HEMASHIELD 8X75 (Vascular Products) ×1 IMPLANT
PENCIL BUTTON HOLSTER BLD 10FT (ELECTRODE) ×5 IMPLANT
POSITIONER HEAD DONUT 9IN (MISCELLANEOUS) ×9 IMPLANT
PUNCH AORTIC ROTATE 4.0MM (MISCELLANEOUS) IMPLANT
PUNCH AORTIC ROTATE 4.5MM 8IN (MISCELLANEOUS) ×5 IMPLANT
PUNCH AORTIC ROTATE 5MM 8IN (MISCELLANEOUS) IMPLANT
SET CARDIOPLEGIA MPS 5001102 (MISCELLANEOUS) ×1 IMPLANT
SHUNT CAROTID BYPASS 10 (VASCULAR PRODUCTS) ×1 IMPLANT
SHUNT CAROTID BYPASS 12FRX15.5 (VASCULAR PRODUCTS) IMPLANT
SPONGE INTESTINAL PEANUT (DISPOSABLE) IMPLANT
SPONGE LAP 18X18 RF (DISPOSABLE) ×3 IMPLANT
SPONGE LAP 4X18 RFD (DISPOSABLE) ×5 IMPLANT
SUPPORT HEART JANKE-BARRON (MISCELLANEOUS) ×1 IMPLANT
SUT BONE WAX W31G (SUTURE) ×5 IMPLANT
SUT ETHILON 2 0 PSLX (SUTURE) ×1 IMPLANT
SUT ETHILON 3 0 PS 1 (SUTURE) IMPLANT
SUT MNCRL AB 4-0 PS2 18 (SUTURE) IMPLANT
SUT PROLENE 3 0 SH DA (SUTURE) IMPLANT
SUT PROLENE 3 0 SH1 36 (SUTURE) ×5 IMPLANT
SUT PROLENE 4 0 RB 1 (SUTURE)
SUT PROLENE 4 0 SH DA (SUTURE) IMPLANT
SUT PROLENE 4-0 RB1 .5 CRCL 36 (SUTURE) IMPLANT
SUT PROLENE 5 0 C 1 36 (SUTURE) IMPLANT
SUT PROLENE 6 0 C 1 30 (SUTURE) ×2 IMPLANT
SUT PROLENE 6 0 CC (SUTURE) ×7 IMPLANT
SUT PROLENE 7 0 BV 1 (SUTURE) IMPLANT
SUT PROLENE 7 0 BV1 MDA (SUTURE) ×5 IMPLANT
SUT PROLENE 8 0 BV175 6 (SUTURE) IMPLANT
SUT SILK  1 MH (SUTURE)
SUT SILK 1 MH (SUTURE) IMPLANT
SUT SILK 2 0 (SUTURE) ×5
SUT SILK 2 0 SH (SUTURE) IMPLANT
SUT SILK 2 0 SH CR/8 (SUTURE) ×1 IMPLANT
SUT SILK 2-0 18XBRD TIE 12 (SUTURE) IMPLANT
SUT SILK 3 0 (SUTURE) ×5
SUT SILK 3-0 18XBRD TIE 12 (SUTURE) IMPLANT
SUT SILK 4 0 (SUTURE) ×5
SUT SILK 4-0 18XBRD TIE 12 (SUTURE) IMPLANT
SUT STEEL STERNAL CCS#1 18IN (SUTURE) IMPLANT
SUT STEEL SZ 6 DBL 3X14 BALL (SUTURE) IMPLANT
SUT VIC AB 1 CTX 36 (SUTURE) ×10
SUT VIC AB 1 CTX36XBRD ANBCTR (SUTURE) ×8 IMPLANT
SUT VIC AB 2-0 CT1 27 (SUTURE) ×10
SUT VIC AB 2-0 CT1 TAPERPNT 27 (SUTURE) IMPLANT
SUT VIC AB 2-0 CTX 27 (SUTURE) IMPLANT
SUT VIC AB 3-0 SH 27 (SUTURE) ×5
SUT VIC AB 3-0 SH 27X BRD (SUTURE) ×8 IMPLANT
SUT VIC AB 3-0 X1 27 (SUTURE) ×2 IMPLANT
SUT VIC AB 4-0 PS2 18 (SUTURE) ×1 IMPLANT
SUT VICRYL 4-0 PS2 18IN ABS (SUTURE) ×5 IMPLANT
SYR CONTROL 10ML LL (SYRINGE) IMPLANT
SYSTEM SAHARA CHEST DRAIN ATS (WOUND CARE) ×5 IMPLANT
TAPE CLOTH SURG 4X10 WHT LF (GAUZE/BANDAGES/DRESSINGS) ×1 IMPLANT
TAPE PAPER 2X10 WHT MICROPORE (GAUZE/BANDAGES/DRESSINGS) ×1 IMPLANT
TOWEL GREEN STERILE (TOWEL DISPOSABLE) ×9 IMPLANT
TOWEL GREEN STERILE FF (TOWEL DISPOSABLE) ×5 IMPLANT
TRAY FOLEY SLVR 16FR TEMP STAT (SET/KITS/TRAYS/PACK) ×5 IMPLANT
TUBING LAP HI FLOW INSUFFLATIO (TUBING) ×5 IMPLANT
UNDERPAD 30X30 (UNDERPADS AND DIAPERS) ×5 IMPLANT
WATER STERILE IRR 1000ML POUR (IV SOLUTION) ×2 IMPLANT

## 2019-10-07 NOTE — Op Note (Signed)
   OPERATIVE REPORT  DATE OF SURGERY: 10/07/2019  PATIENT: Joshua Roy, 69 y.o. male MRN: [id number]  DOB: Sep 18, 1950  PRE-OPERATIVE DIAGNOSIS: Right Carotid Stenosis, Asymptomatic  POST-OPERATIVE DIAGNOSIS:  Same  PROCEDURE:  Right Carotid Endarterectomy with Dacron Patch Angioplasty  SURGEON:  Curt Jews, M.D.  PHYSICIAN ASSISTANT: Remi Haggard PA_C  ANESTHESIA:   general  EBL: Less than 200 ml  Total I/O In: -  Out: 350 [Urine:350]  BLOOD ADMINISTERED: none  DRAINS: none   SPECIMEN: none  COUNTS CORRECT:  YES  PLAN OF CARE: Admit to inpatient   PATIENT DISPOSITION: Coronary bypass grafting with Dr. Cyndia Bent  PROCEDURE DETAILS:  Patient is asymptomatic from a carotid standpoint.  Had critical coronary disease and was scheduled for coronary artery bypass grafting with Dr. Cyndia Bent.  Preoperative imaging revealed critical right carotid stenosis.  I discussed this with the patient and recommended combined right carotid artery endarterectomy in conjunction with coronary artery bypass grafting  The patient was taken to the operating room placed in supine position.  General anesthesia was administered.  The neck was prepped and draped in the usual sterile fashion.  An incision was made anterior to the sternocleidomastoid and carried down through the platysma with electrocautery.  The sternocleidomastoid was reflected posteriorly and the carotid sheath was opened.  The facial vein was ligated with 2-0 silk ties and divided.  The common carotid artery was encircled with an umbilical tape and Rummel tourniquet.  The vagus nerve was identified and preserved.  Dissection was continued onto the carotid bifurcation.  The superior thyroid artery was encircled with a 2-0 silk Potts tie.  The external carotid was encircled with a blue vessel loop and the internal carotid was encircled with an umbilical tape and Rummel tourniquet.  The hypoglossal nerve was identified and preserved.   The patient was given systemic heparin and after adequate circulation time, the internal, external and common carotid arteries were occluded with vascular clamps.  The common carotid artery was opened with an 11 blade and extended  longitudinally with Potts scissors.  A 10 shunt was passed up the internal carotid and allowed to backbleed.  It was then passed down the common carotid where it was secured with Rummel tourniquet.  The endarterectomy was begun on the common carotid artery and the plaque was divided proximally with Potts scissors.  The endarterectomy was continued onto the bifurcation.  The external carotid was endarterectomized with an eversion technique and the internal carotid was endarterectomized in an open fashion.  Remaining atheromatous debris was removed from the endarterectomy plane.  A Finesse Hemashield Dacron patch was brought onto the field and was sewn as a patch angioplasty with a running 6-0 Prolene suture.  Prior to completion of the closure the shunt was removed and the usual flushing maneuvers were undertaken.  The anastomosis was completed and flow was restored first to the external and then the internal carotid artery.  Excellent flow characteristics were noted with hand-held Doppler in the internal and external carotid arteries.   The wounds were irrigated with saline.  Hemostasis was obtained with electrocautery..  A Surgicel and Ray-Tec were placed in the base of the wound and were temporary closed with 2-0 nylon mattress sutures.  The patient then underwent coronary bypass grafting which be dictated separate note with Dr. Charlann Lange, M.D. 10/07/2019 10:48 AM

## 2019-10-07 NOTE — Anesthesia Procedure Notes (Signed)
Procedure Name: Intubation Date/Time: 10/07/2019 8:03 AM Performed by: Inda Coke, CRNA Pre-anesthesia Checklist: Patient identified, Emergency Drugs available, Suction available and Patient being monitored Patient Re-evaluated:Patient Re-evaluated prior to induction Oxygen Delivery Method: Circle System Utilized Preoxygenation: Pre-oxygenation with 100% oxygen Induction Type: IV induction Ventilation: Oral airway inserted - appropriate to patient size and Two handed mask ventilation required Laryngoscope Size: Mac and 4 Grade View: Grade III Tube type: Oral Tube size: 8.0 mm Number of attempts: 1 Airway Equipment and Method: Stylet and Oral airway Placement Confirmation: ETT inserted through vocal cords under direct vision,  positive ETCO2 and breath sounds checked- equal and bilateral Secured at: 23 cm Tube secured with: Tape Dental Injury: Teeth and Oropharynx as per pre-operative assessment

## 2019-10-07 NOTE — Brief Op Note (Signed)
10/07/2019  1:12 PM  PATIENT:  Joshua Roy  69 y.o. male  PRE-OPERATIVE DIAGNOSIS:  CORONARY ARTERY DISEASE  POST-OPERATIVE DIAGNOSIS:  CORONARY ARTERY DISEASE  PROCEDURE:  Procedure(s): CORONARY ARTERY BYPASS GRAFTING (CABG) (N/A) TRANSESOPHAGEAL ECHOCARDIOGRAM (TEE) (N/A) ENDARTERECTOMY CAROTID (Right)  LIMA-LAD SVG-OM SVG-RPD SVG-INTERM.  SURGEON:  Surgeon(s) and Role: Panel 1:    * Bartle, Fernande Boyden, MD - Primary Panel 2:    * Early, Arvilla Meres, MD - Primary  PHYSICIAN ASSISTANT: Qusay Villada PA-C  ANESTHESIA:   general  EBL:  50 mL FOR CAROTID  BLOOD ADMINISTERED:none  DRAINS: LEFT PLEURAL AND MEDIASTINAL CHEST TUBES   LOCAL MEDICATIONS USED:  NONE  SPECIMEN:  No Specimen  DISPOSITION OF SPECIMEN:  N/A  COUNTS:  YES  TOURNIQUET:  * No tourniquets in log *  DICTATION: .Dragon Dictation  PLAN OF CARE: Admit to inpatient   PATIENT DISPOSITION:  ICU - intubated and hemodynamically stable.   Delay start of Pharmacological VTE agent (>24hrs) due to surgical blood loss or risk of bleeding: yes  COMPLICATIONS: NO KNOWN

## 2019-10-07 NOTE — Anesthesia Procedure Notes (Signed)
Arterial Line Insertion Start/End3/06/2020 8:12 AM, 10/07/2019 8:16 AM Performed by: Wilburn Cornelia, CRNA, CRNA  Patient location: OR. Preanesthetic checklist: patient identified, IV checked, site marked, risks and benefits discussed, surgical consent, monitors and equipment checked, pre-op evaluation, timeout performed and anesthesia consent Patient sedated Right, radial was placed Catheter size: 20 G Hand hygiene performed  and maximum sterile barriers used  Allen's test indicative of satisfactory collateral circulation Attempts: 1 Procedure performed without using ultrasound guided technique. Following insertion, Biopatch and dressing applied. Post procedure assessment: normal  Patient tolerated the procedure well with no immediate complications.

## 2019-10-07 NOTE — Anesthesia Procedure Notes (Signed)
Arterial Line Insertion Start/End3/06/2020 6:45 AM, 10/07/2019 6:50 AM Performed by: Inda Coke, CRNA, CRNA  Preanesthetic checklist: patient identified, IV checked, site marked, risks and benefits discussed, surgical consent, monitors and equipment checked, pre-op evaluation, timeout performed and anesthesia consent Lidocaine 1% used for infiltration and patient sedated Left, radial was placed Catheter size: 20 G Hand hygiene performed  and maximum sterile barriers used  Allen's test indicative of satisfactory collateral circulation Attempts: 1 Procedure performed without using ultrasound guided technique. Following insertion, dressing applied and Biopatch. Post procedure assessment: normal  Patient tolerated the procedure well with no immediate complications.

## 2019-10-07 NOTE — Interval H&P Note (Signed)
History and Physical Interval Note:  10/07/2019 7:19 AM  Joshua Roy  has presented today for surgery, with the diagnosis of CAD.  The various methods of treatment have been discussed with the patient and family. After consideration of risks, benefits and other options for treatment, the patient has consented to  Procedure(s): CORONARY ARTERY BYPASS GRAFTING (CABG) (N/A) TRANSESOPHAGEAL ECHOCARDIOGRAM (TEE) (N/A) ENDARTERECTOMY CAROTID (Right) as a surgical intervention.  The patient's history has been reviewed, patient examined, no change in status, stable for surgery.  I have reviewed the patient's chart and labs.  Questions were answered to the patient's satisfaction.     Curt Jews

## 2019-10-07 NOTE — Transfer of Care (Signed)
Immediate Anesthesia Transfer of Care Note  Patient: Joshua Roy  Procedure(s) Performed: CORONARY ARTERY BYPASS GRAFTING (CABG) using LIMA to LAD; Endoscopic harvesting of left greater saphenous vein: SVG to RAMUS; SVG to OM1; SVG to PDA. (N/A Chest) TRANSESOPHAGEAL ECHOCARDIOGRAM (TEE) (N/A ) Endovein Harvest Of Greater Saphenous Vein (Left Leg Upper) ENDARTERECTOMY CAROTID (Right )  Patient Location: SICU  Anesthesia Type:General  Level of Consciousness: Patient remains intubated per anesthesia plan  Airway & Oxygen Therapy: Patient remains intubated per anesthesia plan and Patient placed on Ventilator (see vital sign flow sheet for setting)  Post-op Assessment: Report given to RN and Post -op Vital signs reviewed and stable  Post vital signs: Reviewed and stable  Last Vitals:  Vitals Value Taken Time  BP    Temp    Pulse    Resp    SpO2      Last Pain:  Vitals:   10/07/19 0607  TempSrc: Oral  PainSc: 0-No pain         Complications: No apparent anesthesia complications

## 2019-10-07 NOTE — Anesthesia Postprocedure Evaluation (Signed)
Anesthesia Post Note  Patient: Joshua Roy  Procedure(s) Performed: CORONARY ARTERY BYPASS GRAFTING (CABG) using LIMA to LAD; Endoscopic harvesting of left greater saphenous vein: SVG to RAMUS; SVG to OM1; SVG to PDA. (N/A Chest) TRANSESOPHAGEAL ECHOCARDIOGRAM (TEE) (N/A ) Endovein Harvest Of Greater Saphenous Vein (Left Leg Upper) ENDARTERECTOMY CAROTID (Right )     Patient location during evaluation: SICU Anesthesia Type: General Level of consciousness: sedated Pain management: pain level controlled Vital Signs Assessment: post-procedure vital signs reviewed and stable Respiratory status: patient remains intubated per anesthesia plan Cardiovascular status: stable Postop Assessment: no apparent nausea or vomiting Anesthetic complications: no    Last Vitals:  Vitals:   10/07/19 0607 10/07/19 1558  BP: (!) 160/68   Pulse: 69   Resp: 18   Temp: 37.1 C   SpO2: 96% 99%    Last Pain:  Vitals:   10/07/19 0607  TempSrc: Oral  PainSc: 0-No pain                 Madine Sarr,W. EDMOND

## 2019-10-07 NOTE — Anesthesia Procedure Notes (Signed)
Central Venous Catheter Insertion Performed by: Roderic Palau, MD, anesthesiologist Start/End3/06/2020 6:40 AM, 10/07/2019 6:55 AM Patient location: Pre-op. Preanesthetic checklist: patient identified, IV checked, site marked, risks and benefits discussed, surgical consent, monitors and equipment checked, pre-op evaluation, timeout performed and anesthesia consent Position: Trendelenburg Lidocaine 1% used for infiltration and patient sedated Hand hygiene performed , maximum sterile barriers used  and Seldinger technique used Catheter size: 9 Fr Total catheter length 10. Central line was placed.MAC introducer Procedure performed using ultrasound guided technique. Ultrasound Notes:anatomy identified, needle tip was noted to be adjacent to the nerve/plexus identified, no ultrasound evidence of intravascular and/or intraneural injection and image(s) printed for medical record Attempts: 1 Following insertion, line sutured, dressing applied and Biopatch. Post procedure assessment: blood return through all ports, free fluid flow and no air  Patient tolerated the procedure well with no immediate complications.

## 2019-10-07 NOTE — Anesthesia Procedure Notes (Signed)
Central Venous Catheter Insertion Performed by: Roderic Palau, MD, anesthesiologist Start/End3/06/2020 6:40 AM, 10/07/2019 6:55 AM Patient location: Pre-op. Preanesthetic checklist: patient identified, IV checked, site marked, risks and benefits discussed, surgical consent, monitors and equipment checked, pre-op evaluation, timeout performed and anesthesia consent Hand hygiene performed  and maximum sterile barriers used  PA cath was placed.Swan type:thermodilution PA Cath depth:50 Procedure performed without using ultrasound guided technique. Attempts: 1 Patient tolerated the procedure well with no immediate complications.

## 2019-10-07 NOTE — OR Nursing (Signed)
Sternal closure performed using Arthrex Sternal closure system: Opened 3 FiberTape Cerclage Passing Needles, Medium (ref: [id number]); 3 FiberTape Sternal Closure; and 5 TigerTape Sternal Closure, implanting 7 of the 8 opened onto the field by Dr. Gaye Pollack, MD with Vendor Representative Will Stokes available for consulting, on 10/07/2019 @ 1418.

## 2019-10-07 NOTE — Op Note (Signed)
CARDIOVASCULAR SURGERY OPERATIVE NOTE  10/07/2019  Surgeon:  Gaye Pollack, MD  First Assistant: Jadene Pierini,  PA-C   Preoperative Diagnosis:  Severe multi-vessel coronary artery disease   Postoperative Diagnosis:  Same   Procedure:  1. Median Sternotomy 2. Extracorporeal circulation 3.   Coronary artery bypass grafting x 4   Left internal mammary graft to the LAD  SVG to Ramus  SVG to OM  SVG to PDA   4.   Endoscopic vein harvest from the left leg   Anesthesia:  General Endotracheal   Clinical History/Surgical Indication:  This 69 year old gentleman has 70% left main and severe three-vessel coronary disease with recent New York Heart Association class IV symptoms of exertional fatigue and shortness of breath with a 30 pound weight gain due to edema. He is much improved with diuresis. 2D echocardiogram shows normal left ventricular systolic function with grade 1 diastolic dysfunction. I agree that coronary bypass graft surgery is the best treatment to prevent repeated episodes of worsening congestive heart failure and progressive left ventricular deterioration. Preop carotid doppler exam showed a high grade right ICA stenosis. The study was reviewed with Dr. Donnetta Hutching and he plans right carotid endarterectomy at the same time as CABG. I reviewed the cardiac catheterization films with the patient and his wife and answered all of their questions. He is at increased risk for coronary bypass graft surgery due to ongoing heavy smoking, COPD, morbid obesity, diabetes, and stage III chronic kidney disease with a creatinine about 1.8. I discussed the operative procedure with the patient and his wife including alternatives, benefits and risks; including but not limited to bleeding, blood transfusion, infection, stroke, myocardial infarction, graft failure, heart block requiring a permanent  pacemaker, organ dysfunction, renal failure requiring dialysis,and death. Venia Carbon Terhaar understands and agrees to proceed.   Preparation:  The patient was seen in the preoperative holding area and the correct patient, correct operation were confirmed with the patient after reviewing the medical record and catheterization. The consent was signed by me. Preoperative antibiotics were given. A pulmonary arterial line and radial arterial line were placed by the anesthesia team. The patient was taken back to the operating room and positioned supine on the operating room table. After being placed under general endotracheal anesthesia by the anesthesia team a foley catheter was placed. The neck, chest, abdomen, and both legs were prepped with betadine soap and solution and draped in the usual sterile manner. A surgical time-out was taken and the correct patient and operative procedure were confirmed with the nursing and anesthesia staff.   Cardiopulmonary Bypass:  After completion of the right carotid endarterectomy by Dr. Donnetta Hutching a median sternotomy was performed. The pericardium was opened in the midline. Right ventricular function appeared normal. The ascending aorta was of normal size and had no palpable plaque. There were no contraindications to aortic cannulation or cross-clamping. The patient was fully systemically heparinized and the ACT was maintained > 400 sec. The proximal aortic arch was cannulated with a 64 F aortic cannula for arterial inflow. Venous cannulation was performed via the right atrial appendage using a two-staged venous cannula. An antegrade cardioplegia/vent cannula was inserted into the mid-ascending aorta. Aortic occlusion was performed with a single cross-clamp. Systemic cooling to 32 degrees Centigrade and topical cooling of the heart with iced saline were used. Hyperkalemic antegrade cold blood cardioplegia was used to induce diastolic arrest and was then given at about 20  minute intervals throughout the period of arrest to maintain myocardial  temperature at or below 10 degrees centigrade. A temperature probe was inserted into the interventricular septum and an insulating pad was placed in the pericardium.   Left internal mammary harvest:  The left side of the sternum was retracted using the Rultract retractor. The left internal mammary artery was harvested as a pedicle graft. All side branches were clipped. It was a medium-sized vessel of good quality with excellent blood flow. It was ligated distally and divided. It was sprayed with topical papaverine solution to prevent vasospasm.   Endoscopic vein harvest:  The left greater saphenous vein was harvested endoscopically through a 2 cm incision medial to the left knee. It was harvested from the upper thigh to below the knee. It was a medium-sized vein of good quality. The side branches were all ligated with 4-0 silk ties. We initially examined the right GSV at the knee and it was small and not harvested.   Coronary arteries:  The coronary arteries were examined.   LAD:  Large vessel with minimal distal disease  LCX:  Large ramus branch with minimal distal disease. The OM was a small vessel but was suitable for grafting proximally.  RCA:  Diffusely diseased but the PDA branch was a medium sized vessel with no distal disease.   Grafts:  1. LIMA to the LAD: 2.5 mm. It was sewn end to side using 8-0 prolene continuous suture. 2. SVG to Ramus:  1.75 mm. It was sewn end to side using 7-0 prolene continuous suture. 3. SVG to OM:  1.6 mm. It was sewn end to side using 7-0 prolene continuous suture. 4. SVG to PDA:  1.75 mm. It was sewn end to side using 7-0 prolene continuous suture.  The proximal vein graft anastomoses were performed to the mid-ascending aorta using continuous 6-0 prolene suture. Graft markers were placed around the proximal anastomoses.   Completion:  The patient was rewarmed to 37  degrees Centigrade. The clamp was removed from the LIMA pedicle and there was rapid warming of the septum and return of ventricular fibrillation. The crossclamp was removed with a time of 84 minutes. There was spontaneous return of sinus rhythm. The distal and proximal anastomoses were checked for hemostasis. The position of the grafts was satisfactory. Two temporary epicardial pacing wires were placed on the right atrium and two on the right ventricle. The patient was weaned from CPB without difficulty on no inotropes. CPB time was 103 minutes. Cardiac output was not determined because anesthesia could not get the swan to float into the PA. TEE showed normal LV systolic function Heparin was fully reversed with protamine and the aortic and venous cannulas removed. Hemostasis was achieved. Mediastinal and left pleural drainage tubes were placed. The sternum was closed using the Arthrex Sternal Closure System. The fascia was closed with continuous # 1 vicryl suture. The subcutaneous tissue was closed with 2-0 vicryl continuous suture. The skin was closed with 3-0 vicryl subcuticular suture. All sponge, needle, and instrument counts were reported correct at the end of the case. Dry sterile dressings were placed over the incisions and around the chest tubes which were connected to pleurevac suction. The patient was then transported to the surgical intensive care unit in critical but stable condition.

## 2019-10-07 NOTE — OR Nursing (Signed)
Patient family updated via text messaging @ 1159, stating first procedure complete and second procedure under way. Progressing as planned.

## 2019-10-07 NOTE — Interval H&P Note (Signed)
History and Physical Interval Note:  10/07/2019 7:22 AM  Joshua Roy  has presented today for surgery, with the diagnosis of CAD.  The various methods of treatment have been discussed with the patient and family. After consideration of risks, benefits and other options for treatment, the patient has consented to  Procedure(s): CORONARY ARTERY BYPASS GRAFTING (CABG) (N/A) TRANSESOPHAGEAL ECHOCARDIOGRAM (TEE) (N/A) ENDARTERECTOMY CAROTID (Right) as a surgical intervention.  The patient's history has been reviewed, patient examined, no change in status, stable for surgery.  I have reviewed the patient's chart and labs.  Questions were answered to the patient's satisfaction.     Gaye Pollack

## 2019-10-07 NOTE — Progress Notes (Signed)
Pt placed back on full support on documented settings due to critical ABG

## 2019-10-08 ENCOUNTER — Inpatient Hospital Stay (HOSPITAL_COMMUNITY): Payer: Medicare Other

## 2019-10-08 LAB — POCT I-STAT 7, (LYTES, BLD GAS, ICA,H+H)
Acid-base deficit: 4 mmol/L — ABNORMAL HIGH (ref 0.0–2.0)
Acid-base deficit: 2 mmol/L (ref 0.0–2.0)
Acid-base deficit: 2 mmol/L (ref 0.0–2.0)
Acid-base deficit: 3 mmol/L — ABNORMAL HIGH (ref 0.0–2.0)
Bicarbonate: 26.1 mmol/L (ref 20.0–28.0)
Bicarbonate: 23.1 mmol/L (ref 20.0–28.0)
Bicarbonate: 23.8 mmol/L (ref 20.0–28.0)
Bicarbonate: 22.1 mmol/L (ref 20.0–28.0)
Calcium, Ion: 1.2 mmol/L (ref 1.15–1.40)
Calcium, Ion: 1.15 mmol/L (ref 1.15–1.40)
Calcium, Ion: 1.19 mmol/L (ref 1.15–1.40)
Calcium, Ion: 1.04 mmol/L — ABNORMAL LOW (ref 1.15–1.40)
HCT: 40 % (ref 39.0–52.0)
HCT: 35 % — ABNORMAL LOW (ref 39.0–52.0)
HCT: 36 % — ABNORMAL LOW (ref 39.0–52.0)
HCT: 32 % — ABNORMAL LOW (ref 39.0–52.0)
Hemoglobin: 13.6 g/dL (ref 13.0–17.0)
Hemoglobin: 11.9 g/dL — ABNORMAL LOW (ref 13.0–17.0)
Hemoglobin: 12.2 g/dL — ABNORMAL LOW (ref 13.0–17.0)
Hemoglobin: 10.9 g/dL — ABNORMAL LOW (ref 13.0–17.0)
O2 Saturation: 90 %
O2 Saturation: 93 %
O2 Saturation: 91 %
O2 Saturation: 93 %
Patient temperature: 36.6
Patient temperature: 37.8
Potassium: 4.6 mmol/L (ref 3.5–5.1)
Potassium: 4 mmol/L (ref 3.5–5.1)
Potassium: 4 mmol/L (ref 3.5–5.1)
Potassium: 3.6 mmol/L (ref 3.5–5.1)
Sodium: 140 mmol/L (ref 135–145)
Sodium: 139 mmol/L (ref 135–145)
Sodium: 140 mmol/L (ref 135–145)
Sodium: 142 mmol/L (ref 135–145)
TCO2: 28 mmol/L (ref 22–32)
TCO2: 24 mmol/L (ref 22–32)
TCO2: 25 mmol/L (ref 22–32)
TCO2: 23 mmol/L (ref 22–32)
pCO2 arterial: 67.3 mmHg (ref 32.0–48.0)
pCO2 arterial: 39.9 mmHg (ref 32.0–48.0)
pCO2 arterial: 42.2 mmHg (ref 32.0–48.0)
pCO2 arterial: 39 mmHg (ref 32.0–48.0)
pH, Arterial: 7.194 — CL (ref 7.350–7.450)
pH, Arterial: 7.374 (ref 7.350–7.450)
pH, Arterial: 7.36 (ref 7.350–7.450)
pH, Arterial: 7.362 (ref 7.350–7.450)
pO2, Arterial: 72 mmHg — ABNORMAL LOW (ref 83.0–108.0)
pO2, Arterial: 73 mmHg — ABNORMAL LOW (ref 83.0–108.0)
pO2, Arterial: 64 mmHg — ABNORMAL LOW (ref 83.0–108.0)
pO2, Arterial: 70 mmHg — ABNORMAL LOW (ref 83.0–108.0)

## 2019-10-08 LAB — CBC
HCT: 40 % (ref 39.0–52.0)
HCT: 40.2 % (ref 39.0–52.0)
Hemoglobin: 11.9 g/dL — ABNORMAL LOW (ref 13.0–17.0)
Hemoglobin: 11.8 g/dL — ABNORMAL LOW (ref 13.0–17.0)
MCH: 23.3 pg — ABNORMAL LOW (ref 26.0–34.0)
MCH: 23.5 pg — ABNORMAL LOW (ref 26.0–34.0)
MCHC: 29.8 g/dL — ABNORMAL LOW (ref 30.0–36.0)
MCHC: 29.4 g/dL — ABNORMAL LOW (ref 30.0–36.0)
MCV: 78.3 fL — ABNORMAL LOW (ref 80.0–100.0)
MCV: 79.9 fL — ABNORMAL LOW (ref 80.0–100.0)
Platelets: 231 10*3/uL (ref 150–400)
Platelets: 210 10*3/uL (ref 150–400)
RBC: 5.11 MIL/uL (ref 4.22–5.81)
RBC: 5.03 MIL/uL (ref 4.22–5.81)
RDW: 19.9 % — ABNORMAL HIGH (ref 11.5–15.5)
RDW: 20.3 % — ABNORMAL HIGH (ref 11.5–15.5)
WBC: 10.2 10*3/uL (ref 4.0–10.5)
WBC: 14.2 10*3/uL — ABNORMAL HIGH (ref 4.0–10.5)
nRBC: 0 % (ref 0.0–0.2)
nRBC: 0 % (ref 0.0–0.2)

## 2019-10-08 LAB — BASIC METABOLIC PANEL
Anion gap: 10 (ref 5–15)
Anion gap: 9 (ref 5–15)
BUN: 15 mg/dL (ref 8–23)
BUN: 18 mg/dL (ref 8–23)
CO2: 23 mmol/L (ref 22–32)
CO2: 22 mmol/L (ref 22–32)
Calcium: 8.2 mg/dL — ABNORMAL LOW (ref 8.9–10.3)
Calcium: 7.9 mg/dL — ABNORMAL LOW (ref 8.9–10.3)
Chloride: 105 mmol/L (ref 98–111)
Chloride: 103 mmol/L (ref 98–111)
Creatinine, Ser: 1.21 mg/dL (ref 0.61–1.24)
Creatinine, Ser: 1.41 mg/dL — ABNORMAL HIGH (ref 0.61–1.24)
GFR calc Af Amer: >60 mL/min (ref >60)
GFR calc Af Amer: 58 mL/min — ABNORMAL LOW (ref >60)
GFR calc non Af Amer: >60 mL/min (ref >60)
GFR calc non Af Amer: 50 mL/min — ABNORMAL LOW (ref >60)
Glucose, Bld: 97 mg/dL (ref 70–99)
Glucose, Bld: 218 mg/dL — ABNORMAL HIGH (ref 70–99)
Potassium: 4.3 mmol/L (ref 3.5–5.1)
Potassium: 4.5 mmol/L (ref 3.5–5.1)
Sodium: 138 mmol/L (ref 135–145)
Sodium: 134 mmol/L — ABNORMAL LOW (ref 135–145)

## 2019-10-08 LAB — GLUCOSE, CAPILLARY
Glucose-Capillary: 102 mg/dL — ABNORMAL HIGH (ref 70–99)
Glucose-Capillary: 98 mg/dL (ref 70–99)
Glucose-Capillary: 97 mg/dL (ref 70–99)
Glucose-Capillary: 100 mg/dL — ABNORMAL HIGH (ref 70–99)
Glucose-Capillary: 94 mg/dL (ref 70–99)
Glucose-Capillary: 125 mg/dL — ABNORMAL HIGH (ref 70–99)
Glucose-Capillary: 119 mg/dL — ABNORMAL HIGH (ref 70–99)
Glucose-Capillary: 111 mg/dL — ABNORMAL HIGH (ref 70–99)
Glucose-Capillary: 126 mg/dL — ABNORMAL HIGH (ref 70–99)
Glucose-Capillary: 101 mg/dL — ABNORMAL HIGH (ref 70–99)
Glucose-Capillary: 143 mg/dL — ABNORMAL HIGH (ref 70–99)
Glucose-Capillary: 115 mg/dL — ABNORMAL HIGH (ref 70–99)
Glucose-Capillary: 191 mg/dL — ABNORMAL HIGH (ref 70–99)
Glucose-Capillary: 173 mg/dL — ABNORMAL HIGH (ref 70–99)

## 2019-10-08 LAB — MAGNESIUM
Magnesium: 2.2 mg/dL (ref 1.7–2.4)
Magnesium: 2.1 mg/dL (ref 1.7–2.4)

## 2019-10-08 MED ORDER — CHLORHEXIDINE GLUCONATE 0.12 % MT SOLN
15.0000 mL | Freq: Two times a day (BID) | OROMUCOSAL | Status: DC
  Administered 2019-10-08 – 2019-10-11 (×6): 15 mL via OROMUCOSAL
  Filled 2019-10-08 (×5): qty 15

## 2019-10-08 MED ORDER — ORAL CARE MOUTH RINSE
15.0000 mL | Freq: Two times a day (BID) | OROMUCOSAL | Status: DC
  Administered 2019-10-08 – 2019-10-10 (×5): 15 mL via OROMUCOSAL

## 2019-10-08 MED ORDER — AMIODARONE HCL IN DEXTROSE 360-4.14 MG/200ML-% IV SOLN
30.0000 mg/h | INTRAVENOUS | Status: DC
  Administered 2019-10-09: 30 mg/h via INTRAVENOUS
  Filled 2019-10-08: qty 200

## 2019-10-08 MED ORDER — INSULIN DETEMIR 100 UNIT/ML ~~LOC~~ SOLN
8.0000 [IU] | Freq: Two times a day (BID) | SUBCUTANEOUS | Status: DC
  Administered 2019-10-08 – 2019-10-09 (×4): 8 [IU] via SUBCUTANEOUS
  Filled 2019-10-08 (×6): qty 0.08

## 2019-10-08 MED ORDER — AMIODARONE HCL IN DEXTROSE 360-4.14 MG/200ML-% IV SOLN
60.0000 mg/h | INTRAVENOUS | Status: DC
  Administered 2019-10-08 (×2): 60 mg/h via INTRAVENOUS
  Filled 2019-10-08 (×2): qty 200

## 2019-10-08 MED ORDER — AMIODARONE LOAD VIA INFUSION
150.0000 mg | Freq: Once | INTRAVENOUS | Status: AC
  Administered 2019-10-08: 150 mg via INTRAVENOUS
  Filled 2019-10-08: qty 83.34

## 2019-10-08 MED ORDER — INSULIN ASPART 100 UNIT/ML ~~LOC~~ SOLN
3.0000 [IU] | SUBCUTANEOUS | Status: DC
  Administered 2019-10-08 (×2): 6 [IU] via SUBCUTANEOUS
  Administered 2019-10-08: 3 [IU] via SUBCUTANEOUS
  Administered 2019-10-09: 21:00:00 9 [IU] via SUBCUTANEOUS
  Administered 2019-10-09: 6 [IU] via SUBCUTANEOUS
  Administered 2019-10-09: 3 [IU] via SUBCUTANEOUS
  Administered 2019-10-09: 6 [IU] via SUBCUTANEOUS
  Administered 2019-10-09: 3 [IU] via SUBCUTANEOUS
  Administered 2019-10-10 (×3): 6 [IU] via SUBCUTANEOUS
  Administered 2019-10-10: 3 [IU] via SUBCUTANEOUS
  Administered 2019-10-10 (×2): 6 [IU] via SUBCUTANEOUS
  Administered 2019-10-11 (×2): 3 [IU] via SUBCUTANEOUS
  Administered 2019-10-11: 9 [IU] via SUBCUTANEOUS

## 2019-10-08 MED ORDER — CHLORHEXIDINE GLUCONATE CLOTH 2 % EX PADS
6.0000 | MEDICATED_PAD | Freq: Every day | CUTANEOUS | Status: DC
  Administered 2019-10-08 – 2019-10-11 (×4): 6 via TOPICAL

## 2019-10-08 NOTE — Plan of Care (Signed)
  Problem: Cardiac: Goal: Will achieve and/or maintain hemodynamic stability Outcome: Progressing   Problem: Activity: Goal: Ability to tolerate increased activity will improve Outcome: Progressing   Problem: Education: Goal: Knowledge of General Education information will improve Description: Including pain rating scale, medication(s)/side effects and non-pharmacologic comfort measures Outcome: Progressing   Problem: Health Behavior/Discharge Planning: Goal: Ability to manage health-related needs will improve Outcome: Progressing   Problem: Clinical Measurements: Goal: Ability to maintain clinical measurements within normal limits will improve Outcome: Progressing Goal: Will remain free from infection Outcome: Progressing Goal: Diagnostic test results will improve Outcome: Progressing Goal: Respiratory complications will improve Outcome: Progressing Goal: Cardiovascular complication will be avoided Outcome: Progressing   Problem: Activity: Goal: Risk for activity intolerance will decrease Outcome: Progressing   Problem: Nutrition: Goal: Adequate nutrition will be maintained Outcome: Progressing   Problem: Coping: Goal: Level of anxiety will decrease Outcome: Progressing   Problem: Elimination: Goal: Will not experience complications related to bowel motility Outcome: Progressing Goal: Will not experience complications related to urinary retention Outcome: Progressing   Problem: Pain Managment: Goal: General experience of comfort will improve Outcome: Progressing   Problem: Safety: Goal: Ability to remain free from injury will improve Outcome: Progressing   Problem: Skin Integrity: Goal: Risk for impaired skin integrity will decrease Outcome: Progressing

## 2019-10-08 NOTE — Procedures (Signed)
Extubation Procedure Note  Patient Details:   Name: AZZAN BUTLER DOB: [birth year] MRN: [id number]   Airway Documentation:    Vent end date: 10/08/19 Vent end time: 0915   Evaluation  O2 sats: stable throughout Complications: No apparent complications Patient did tolerate procedure well. Bilateral Breath Sounds: Clear   Yes   Patient extubated to 6L nasal cannula.  Positive cuff leak noted.  No evidence of stridor.  Patient able to speak post extubation.  NIF of -30, VC of 1.5L.  Sats currently at 91% and vitals are stable.  No complications noted.    Judith Part 10/08/2019, 9:17 AM

## 2019-10-08 NOTE — Progress Notes (Signed)
1 Day Post-Op Procedure(s) (LRB): CORONARY ARTERY BYPASS GRAFTING (CABG) using LIMA to LAD; Endoscopic harvesting of left greater saphenous vein: SVG to RAMUS; SVG to OM1; SVG to PDA. (N/A) TRANSESOPHAGEAL ECHOCARDIOGRAM (TEE) (N/A) ENDARTERECTOMY CAROTID (Right) Endovein Harvest Of Greater Saphenous Vein (Left) Subjective: Intubated/sedated  Objective: Vital signs in last 24 hours: Temp:  [97.5 F (36.4 C)-100.3 F (37.9 C)] 100 F (37.8 C) (03/13 0600) Pulse Rate:  [56-91] 91 (03/13 0806) Cardiac Rhythm: A-V Sequential paced (03/13 0800) Resp:  [11-23] 14 (03/13 0806) BP: (74-137)/(51-87) 93/51 (03/13 0806) SpO2:  [88 %-100 %] 97 % (03/13 0833) Arterial Line BP: (80-142)/(40-74) 102/55 (03/13 0800) FiO2 (%):  [40 %-50 %] 40 % (03/13 0833)  Hemodynamic parameters for last 24 hours: CVP:  [3 mmHg-26 mmHg] 10 mmHg  Intake/Output from previous day: 03/12 0701 - 03/13 0700 In: 5758.2 [I.V.:4128.2; Blood:550; IV Piggyback:1080] Out: 9470 [Urine:1845; Blood:1090; Chest Tube:320] Intake/Output this shift: Total I/O In: 11.6 [I.V.:11.6] Out: 55 [Urine:35; Chest Tube:20]  General appearance: sedated Neurologic: unable to fully assess Heart: regular rate and rhythm, S1, S2 normal, no murmur, click, rub or gallop Lungs: wheezes bibasilar Abdomen: soft, non-tender; bowel sounds normal; no masses,  no organomegaly Extremities: extremities normal, atraumatic, no cyanosis or edema Wound: dressed/dry  Lab Results: Recent Labs    10/07/19 2150 10/07/19 2150 10/08/19 0358 10/08/19 0611  WBC 13.1*  --  10.2  --   HGB 12.3*   < > 11.9* 11.9*  HCT 40.1   < > 40.0 35.0*  PLT 219  --  231  --    < > = values in this interval not displayed.   BMET:  Recent Labs    10/07/19 2150 10/07/19 2150 10/08/19 0358 10/08/19 0611  NA 137   < > 138 139  K 4.6   < > 4.3 4.0  CL 105  --  105  --   CO2 23  --  23  --   GLUCOSE 140*  --  97  --   BUN 17  --  15  --   CREATININE 1.17  --   1.21  --   CALCIUM 8.0*  --  8.2*  --    < > = values in this interval not displayed.    PT/INR:  Recent Labs    10/07/19 1619  LABPROT 16.6*  INR 1.4*   ABG    Component Value Date/Time   PHART 7.374 10/08/2019 0611   HCO3 23.1 10/08/2019 0611   TCO2 24 10/08/2019 0611   ACIDBASEDEF 2.0 10/08/2019 0611   O2SAT 93.0 10/08/2019 0611   CBG (last 3)  Recent Labs    10/08/19 0453 10/08/19 0610 10/08/19 0703  GLUCAP 94 125* 119*    Assessment/Plan: S/P Procedure(s) (LRB): CORONARY ARTERY BYPASS GRAFTING (CABG) using LIMA to LAD; Endoscopic harvesting of left greater saphenous vein: SVG to RAMUS; SVG to OM1; SVG to PDA. (N/A) TRANSESOPHAGEAL ECHOCARDIOGRAM (TEE) (N/A) ENDARTERECTOMY CAROTID (Right) Endovein Harvest Of Greater Saphenous Vein (Left) extubate this am; will assess hemodynamic support once extubated.   LOS: 1 day    Wonda Olds 10/08/2019

## 2019-10-08 NOTE — Progress Notes (Signed)
Subjective  - POD #1, s/p R CEA / CABG  Remained intubated overnight secondary to acidosis   Physical Exam:  Opens eyes to command  Moves all 4 extremities to command Neck incision without hematoma       Assessment/Plan:  POD #1  Stable from CEA perspective.  Hopeful extubation later today Dr. Donnetta Hutching to re-evaluate on Monday  Joshua Roy 10/08/2019 7:08 AM --  Vitals:   10/08/19 0630 10/08/19 0645  BP: (!) 101/59 (!) 105/58  Pulse: 89 89  Resp: 20 20  Temp:    SpO2: 94% 94%    Intake/Output Summary (Last 24 hours) at 10/08/2019 0708 Last data filed at 10/08/2019 0600 Gross per 24 hour  Intake 5726.85 ml  Output 3200 ml  Net 2526.85 ml     Laboratory CBC    Component Value Date/Time   WBC 10.2 10/08/2019 0358   HGB 11.9 (L) 10/08/2019 0611   HGB 15.3 08/31/2019 1511   HCT 35.0 (L) 10/08/2019 0611   HCT 49.0 08/31/2019 1511   PLT 231 10/08/2019 0358   PLT 360 08/31/2019 1511    BMET    Component Value Date/Time   NA 139 10/08/2019 0611   NA 137 08/31/2019 1511   NA 140 05/14/2013 0252   K 4.0 10/08/2019 0611   K 3.9 05/14/2013 0252   CL 105 10/08/2019 0358   CL 105 05/14/2013 0252   CO2 23 10/08/2019 0358   CO2 29 05/14/2013 0252   GLUCOSE 97 10/08/2019 0358   GLUCOSE 115 (H) 05/14/2013 0252   BUN 15 10/08/2019 0358   BUN 32 (H) 08/31/2019 1511   BUN 34 (H) 05/14/2013 0252   CREATININE 1.21 10/08/2019 0358   CREATININE 1.17 09/26/2013 0944   CALCIUM 8.2 (L) 10/08/2019 0358   CALCIUM 8.6 05/14/2013 0252   GFRNONAA >60 10/08/2019 0358   GFRNONAA >60 05/14/2013 0252   GFRAA >60 10/08/2019 0358   GFRAA >60 05/14/2013 0252    COAG Lab Results  Component Value Date   INR 1.4 (H) 10/07/2019   INR 1.0 10/05/2019   INR 1.02 07/08/2017   No results found for: PTT  Antibiotics Anti-infectives (From admission, onward)   Start     Dose/Rate Route Frequency Ordered Stop   10/07/19 1930  vancomycin (VANCOCIN) IVPB 1000 mg/200 mL  premix     1,000 mg 200 mL/hr over 60 Minutes Intravenous  Once 10/07/19 1545 10/07/19 2219   10/07/19 1730  cefUROXime (ZINACEF) 1.5 g in sodium chloride 0.9 % 100 mL IVPB     1.5 g 200 mL/hr over 30 Minutes Intravenous Every 12 hours 10/07/19 1544 10/09/19 1729   10/07/19 0600  ceFAZolin (ANCEF) 2-4 GM/100ML-% IVPB  Status:  Discontinued    Note to Pharmacy: Gustavo Lah   : cabinet override      10/07/19 0600 10/07/19 1635   10/07/19 0557  ceFAZolin (ANCEF) IVPB 2g/100 mL premix  Status:  Discontinued     2 g 200 mL/hr over 30 Minutes Intravenous 30 min pre-op 10/07/19 0557 10/07/19 1545   10/07/19 0400  vancomycin (VANCOREADY) IVPB 1500 mg/300 mL     1,500 mg 150 mL/hr over 120 Minutes Intravenous To Surgery 10/06/19 0824 10/07/19 0916   10/07/19 0400  cefUROXime (ZINACEF) 1.5 g in sodium chloride 0.9 % 100 mL IVPB     1.5 g 200 mL/hr over 30 Minutes Intravenous To Surgery 10/06/19 0824 10/07/19 1334   10/07/19 0400  cefUROXime (ZINACEF) 750 mg in sodium  chloride 0.9 % 100 mL IVPB  Status:  Discontinued     750 mg 200 mL/hr over 30 Minutes Intravenous To Surgery 10/06/19 0824 10/07/19 1545       V. Leia Alf, M.D., [facility] [place] Office: [phone] Pager:  [phone]

## 2019-10-08 NOTE — Progress Notes (Addendum)
CRITICAL VALUE ALERT  Critical Value: pH 7.194                           pCO2 67.3                         pO2    72.0       Values obtained after patient weaned on ventilator.              Provider Notified: RN notified Dr. Orvan Seen.   Orders Received/Actions taken: Verbal orders received to change ventilator settings to Peep 8, Respirations 20.Dr. Orvan Seen wrote other orders for respiratory therapist. Respiratory therapist notified. Dr. Orvan Seen stated to leave patient on ventilator overnight and reassess ABG in the morning.

## 2019-10-08 NOTE — Progress Notes (Signed)
  Amiodarone Drug - Drug Interaction Consult Note  Recommendations: NO medication changes needed  Amiodarone is metabolized by the cytochrome P450 system and therefore has the potential to cause many drug interactions. Amiodarone has an average plasma half-life of 50 days (range 20 to 100 days).   There is potential for drug interactions to occur several weeks or months after stopping treatment and the onset of drug interactions may be slow after initiating amiodarone.   [x]  Statins: Increased risk of myopathy. Simvastatin- restrict dose to 20mg  daily. Other statins: counsel patients to report any muscle pain or weakness immediately.  []  Anticoagulants: Amiodarone can increase anticoagulant effect. Consider warfarin dose reduction. Patients should be monitored closely and the dose of anticoagulant altered accordingly, remembering that amiodarone levels take several weeks to stabilize.  []  Antiepileptics: Amiodarone can increase plasma concentration of phenytoin, the dose should be reduced. Note that small changes in phenytoin dose can result in large changes in levels. Monitor patient and counsel on signs of toxicity.  []  Beta blockers: increased risk of bradycardia, AV block and myocardial depression. Sotalol - avoid concomitant use.  []   Calcium channel blockers (diltiazem and verapamil): increased risk of bradycardia, AV block and myocardial depression.  []   Cyclosporine: Amiodarone increases levels of cyclosporine. Reduced dose of cyclosporine is recommended.  []  Digoxin dose should be halved when amiodarone is started.  []  Diuretics: increased risk of cardiotoxicity if hypokalemia occurs.  []  Oral hypoglycemic agents (glyburide, glipizide, glimepiride): increased risk of hypoglycemia. Patient's glucose levels should be monitored closely when initiating amiodarone therapy.   []  Drugs that prolong the QT interval:  Torsades de pointes risk may be increased with concurrent use - avoid  if possible.  Monitor QTc, also keep magnesium/potassium WNL if concurrent therapy can't be avoided. Marland Kitchen Antibiotics: e.g. fluoroquinolones, erythromycin. . Antiarrhythmics: e.g. quinidine, procainamide, disopyramide, sotalol. . Antipsychotics: e.g. phenothiazines, haloperidol.  . Lithium, tricyclic antidepressants, and methadone.   Bonnita Nasuti Pharm.D. CPP, BCPS Clinical Pharmacist [phone] 10/08/2019 5:36 PM

## 2019-10-09 ENCOUNTER — Inpatient Hospital Stay (HOSPITAL_COMMUNITY): Payer: Medicare Other

## 2019-10-09 LAB — CBC
HCT: 40.2 % (ref 39.0–52.0)
Hemoglobin: 11.7 g/dL — ABNORMAL LOW (ref 13.0–17.0)
MCH: 23.6 pg — ABNORMAL LOW (ref 26.0–34.0)
MCHC: 29.1 g/dL — ABNORMAL LOW (ref 30.0–36.0)
MCV: 81.2 fL (ref 80.0–100.0)
Platelets: 206 10*3/uL (ref 150–400)
RBC: 4.95 MIL/uL (ref 4.22–5.81)
RDW: 20.8 % — ABNORMAL HIGH (ref 11.5–15.5)
WBC: 13.8 10*3/uL — ABNORMAL HIGH (ref 4.0–10.5)
nRBC: 0 % (ref 0.0–0.2)

## 2019-10-09 LAB — BASIC METABOLIC PANEL
Anion gap: 10 (ref 5–15)
BUN: 21 mg/dL (ref 8–23)
CO2: 24 mmol/L (ref 22–32)
Calcium: 8.2 mg/dL — ABNORMAL LOW (ref 8.9–10.3)
Chloride: 100 mmol/L (ref 98–111)
Creatinine, Ser: 1.51 mg/dL — ABNORMAL HIGH (ref 0.61–1.24)
GFR calc Af Amer: 53 mL/min — ABNORMAL LOW (ref >60)
GFR calc non Af Amer: 46 mL/min — ABNORMAL LOW (ref >60)
Glucose, Bld: 196 mg/dL — ABNORMAL HIGH (ref 70–99)
Potassium: 4.5 mmol/L (ref 3.5–5.1)
Sodium: 134 mmol/L — ABNORMAL LOW (ref 135–145)

## 2019-10-09 LAB — GLUCOSE, CAPILLARY
Glucose-Capillary: 154 mg/dL — ABNORMAL HIGH (ref 70–99)
Glucose-Capillary: 167 mg/dL — ABNORMAL HIGH (ref 70–99)
Glucose-Capillary: 148 mg/dL — ABNORMAL HIGH (ref 70–99)
Glucose-Capillary: 145 mg/dL — ABNORMAL HIGH (ref 70–99)
Glucose-Capillary: 221 mg/dL — ABNORMAL HIGH (ref 70–99)
Glucose-Capillary: 269 mg/dL — ABNORMAL HIGH (ref 70–99)

## 2019-10-09 MED ORDER — AMIODARONE HCL 200 MG PO TABS
400.0000 mg | ORAL_TABLET | Freq: Two times a day (BID) | ORAL | Status: DC
  Administered 2019-10-09 – 2019-10-14 (×11): 400 mg via ORAL
  Filled 2019-10-09 (×11): qty 2

## 2019-10-09 MED ORDER — FUROSEMIDE 10 MG/ML IJ SOLN
60.0000 mg | Freq: Two times a day (BID) | INTRAMUSCULAR | Status: DC
  Administered 2019-10-09 – 2019-10-10 (×3): 60 mg via INTRAVENOUS
  Filled 2019-10-09 (×5): qty 6

## 2019-10-09 MED ORDER — COLCHICINE 0.3 MG HALF TABLET
0.3000 mg | ORAL_TABLET | Freq: Two times a day (BID) | ORAL | Status: DC
  Administered 2019-10-09 – 2019-10-14 (×11): 0.3 mg via ORAL
  Filled 2019-10-09 (×13): qty 1

## 2019-10-09 NOTE — Plan of Care (Signed)
  Problem: Cardiac: Goal: Will achieve and/or maintain hemodynamic stability Outcome: Progressing   Problem: Activity: Goal: Ability to tolerate increased activity will improve Outcome: Progressing   Problem: Education: Goal: Knowledge of General Education information will improve Description: Including pain rating scale, medication(s)/side effects and non-pharmacologic comfort measures Outcome: Progressing   Problem: Health Behavior/Discharge Planning: Goal: Ability to manage health-related needs will improve Outcome: Progressing   Problem: Clinical Measurements: Goal: Ability to maintain clinical measurements within normal limits will improve Outcome: Progressing Goal: Will remain free from infection Outcome: Progressing Goal: Diagnostic test results will improve Outcome: Progressing Goal: Respiratory complications will improve Outcome: Progressing Goal: Cardiovascular complication will be avoided Outcome: Progressing   Problem: Activity: Goal: Risk for activity intolerance will decrease Outcome: Progressing   Problem: Nutrition: Goal: Adequate nutrition will be maintained Outcome: Progressing   Problem: Coping: Goal: Level of anxiety will decrease Outcome: Progressing   Problem: Elimination: Goal: Will not experience complications related to bowel motility Outcome: Progressing Goal: Will not experience complications related to urinary retention Outcome: Progressing   Problem: Pain Managment: Goal: General experience of comfort will improve Outcome: Progressing   Problem: Safety: Goal: Ability to remain free from injury will improve Outcome: Progressing   Problem: Skin Integrity: Goal: Risk for impaired skin integrity will decrease Outcome: Progressing

## 2019-10-10 ENCOUNTER — Encounter: Payer: Self-pay | Admitting: *Deleted

## 2019-10-10 ENCOUNTER — Inpatient Hospital Stay (HOSPITAL_COMMUNITY): Payer: Medicare Other

## 2019-10-10 LAB — CBC
HCT: 39.2 % (ref 39.0–52.0)
Hemoglobin: 11.6 g/dL — ABNORMAL LOW (ref 13.0–17.0)
MCH: 23.6 pg — ABNORMAL LOW (ref 26.0–34.0)
MCHC: 29.6 g/dL — ABNORMAL LOW (ref 30.0–36.0)
MCV: 79.7 fL — ABNORMAL LOW (ref 80.0–100.0)
Platelets: 193 10*3/uL (ref 150–400)
RBC: 4.92 MIL/uL (ref 4.22–5.81)
RDW: 20.6 % — ABNORMAL HIGH (ref 11.5–15.5)
WBC: 10.4 10*3/uL (ref 4.0–10.5)
nRBC: 0 % (ref 0.0–0.2)

## 2019-10-10 LAB — GLUCOSE, CAPILLARY
Glucose-Capillary: 128 mg/dL — ABNORMAL HIGH (ref 70–99)
Glucose-Capillary: 145 mg/dL — ABNORMAL HIGH (ref 70–99)
Glucose-Capillary: 152 mg/dL — ABNORMAL HIGH (ref 70–99)
Glucose-Capillary: 161 mg/dL — ABNORMAL HIGH (ref 70–99)
Glucose-Capillary: 162 mg/dL — ABNORMAL HIGH (ref 70–99)
Glucose-Capillary: 185 mg/dL — ABNORMAL HIGH (ref 70–99)

## 2019-10-10 LAB — BASIC METABOLIC PANEL
Anion gap: 7 (ref 5–15)
BUN: 21 mg/dL (ref 8–23)
CO2: 29 mmol/L (ref 22–32)
Calcium: 8.4 mg/dL — ABNORMAL LOW (ref 8.9–10.3)
Chloride: 99 mmol/L (ref 98–111)
Creatinine, Ser: 1.65 mg/dL — ABNORMAL HIGH (ref 0.61–1.24)
GFR calc Af Amer: 48 mL/min — ABNORMAL LOW (ref >60)
GFR calc non Af Amer: 41 mL/min — ABNORMAL LOW (ref >60)
Glucose, Bld: 179 mg/dL — ABNORMAL HIGH (ref 70–99)
Potassium: 4.3 mmol/L (ref 3.5–5.1)
Sodium: 135 mmol/L (ref 135–145)

## 2019-10-10 MED ORDER — INSULIN DETEMIR 100 UNIT/ML ~~LOC~~ SOLN
30.0000 [IU] | Freq: Every day | SUBCUTANEOUS | Status: DC
  Administered 2019-10-10 – 2019-10-11 (×2): 30 [IU] via SUBCUTANEOUS
  Filled 2019-10-10 (×2): qty 0.3

## 2019-10-10 MED ORDER — TRAMADOL HCL 50 MG PO TABS: 50.0000 mg | ORAL_TABLET | ORAL | Status: DC | PRN

## 2019-10-10 MED ORDER — METOPROLOL TARTRATE 25 MG/10 ML ORAL SUSPENSION: 25.0000 mg | Freq: Two times a day (BID) | ORAL | Status: DC

## 2019-10-10 MED ORDER — POTASSIUM CHLORIDE CRYS ER 20 MEQ PO TBCR
20.0000 meq | EXTENDED_RELEASE_TABLET | Freq: Two times a day (BID) | ORAL | Status: AC
  Administered 2019-10-10 (×2): 20 meq via ORAL
  Filled 2019-10-10 (×2): qty 1

## 2019-10-10 MED ORDER — AMIODARONE IV BOLUS ONLY 150 MG/100ML
150.0000 mg | Freq: Once | INTRAVENOUS | Status: AC
  Administered 2019-10-10: 150 mg via INTRAVENOUS
  Filled 2019-10-10: qty 100

## 2019-10-10 MED ORDER — ORAL CARE MOUTH RINSE
15.0000 mL | Freq: Two times a day (BID) | OROMUCOSAL | Status: DC
  Administered 2019-10-10 – 2019-10-14 (×3): 15 mL via OROMUCOSAL

## 2019-10-10 MED ORDER — METOPROLOL TARTRATE 25 MG PO TABS
25.0000 mg | ORAL_TABLET | Freq: Two times a day (BID) | ORAL | Status: DC
  Administered 2019-10-10 – 2019-10-11 (×3): 25 mg via ORAL
  Filled 2019-10-10 (×3): qty 1

## 2019-10-10 MED ORDER — METOLAZONE 2.5 MG PO TABS
2.5000 mg | ORAL_TABLET | Freq: Every day | ORAL | Status: AC
  Administered 2019-10-10 – 2019-10-11 (×2): 2.5 mg via ORAL
  Filled 2019-10-10 (×2): qty 1

## 2019-10-10 MED FILL — Potassium Chloride Inj 2 mEq/ML: INTRAVENOUS | Qty: 40 | Status: AC

## 2019-10-10 MED FILL — Thrombin (Recombinant) For Soln 20000 Unit: CUTANEOUS | Qty: 1 | Status: AC

## 2019-10-10 MED FILL — Magnesium Sulfate Inj 50%: INTRAMUSCULAR | Qty: 10 | Status: AC

## 2019-10-10 MED FILL — Heparin Sodium (Porcine) Inj 1000 Unit/ML: INTRAMUSCULAR | Qty: 30 | Status: AC

## 2019-10-10 NOTE — Discharge Instructions (Signed)
[facility]  Discharge Instructions   Carotid Endarterectomy (CEA)  Please refer to the following instructions for your post-procedure care. Your surgeon or physician assistant will discuss any changes with you.  Activity  You are encouraged to walk as much as you can. You can slowly return to normal activities but must avoid strenuous activity and heavy lifting until your doctor tell you it's OK. Avoid activities such as vacuuming or swinging a golf club. You can drive after one week if you are comfortable and you are no longer taking prescription pain medications. It is normal to feel tired for serval weeks after your surgery. It is also normal to have difficulty with sleep habits, eating, and bowel movements after surgery. These will go away with time.  Bathing/Showering  You may shower after you come home. Do not soak in a bathtub, hot tub, or swim until the incision heals completely.  Incision Care  Shower every day. Clean your incision with mild soap and water. Pat the area dry with a clean towel. You do not need a bandage unless otherwise instructed. Do not apply any ointments or creams to your incision. You may have skin glue on your incision. Do not peel it off. It will come off on its own in about one week. Your incision may feel thickened and raised for several weeks after your surgery. This is normal and the skin will soften over time. For Men Only: It's OK to shave around the incision but do not shave the incision itself for 2 weeks. It is common to have numbness under your chin that could last for several months.  Diet  Resume your normal diet. There are no special food restrictions following this procedure. A low fat/low cholesterol diet is recommended for all patients with vascular disease. In order to heal from your surgery, it is CRITICAL to get adequate nutrition. Your body requires vitamins, minerals, and protein. Vegetables are the best  source of vitamins and minerals. Vegetables also provide the perfect balance of protein. Processed food has little nutritional value, so try to avoid this.        Medications  Resume taking all of your medications unless your doctor or physician assistant tells you not to. If your incision is causing pain, you may take over-the- counter pain relievers such as acetaminophen (Tylenol). If you were prescribed a stronger pain medication, please be aware these medications can cause nausea and constipation. Prevent nausea by taking the medication with a snack or meal. Avoid constipation by drinking plenty of fluids and eating foods with a high amount of fiber, such as fruits, vegetables, and grains. Do not take Tylenol if you are taking prescription pain medications.  Follow Up  Our office will schedule a follow up appointment 2-3 weeks following discharge.  Please call us immediately for any of the following conditions  Increased pain, redness, drainage (pus) from your incision site. Fever of 101 degrees or higher. If you should develop stroke (slurred speech, difficulty swallowing, weakness on one side of your body, loss of vision) you should call 911 and go to the nearest emergency room.  Reduce your risk of vascular disease:  Stop smoking. If you would like help call [facility] at 1-800-QUIT-NOW (417) 209-8991) or [place] at [phone]. Manage your cholesterol Maintain a desired weight Control your diabetes Keep your blood pressure down  If you have any questions, please call the office at [phone].    Coronary Artery Bypass Grafting, Care  After This sheet gives you information about how to care for yourself after your procedure. Your doctor may also give you more specific instructions. If you have problems or questions, call your doctor. What can I expect after the procedure? After the procedure, it is common to:  Feel sick to your stomach (nauseous).  Not want to  eat as much as normal (lack of appetite).  Have trouble pooping (constipation).  Have weakness and tiredness (fatigue).  Feel sad (depressed) or grouchy (irritable).  Have pain or discomfort around the cuts from surgery (incisions). Follow these instructions at home: Medicines  Take over-the-counter and prescription medicines only as told by your doctor. Do not stop taking medicines or start any new medicines unless your doctor says it is okay.  If you were prescribed an antibiotic medicine, take it as told by your doctor. Do not stop taking the antibiotic even if you start to feel better. Incision care   Follow instructions from your doctor about how to take care of your cuts from surgery. Make sure you: ? Wash your hands with soap and water before and after you change your bandage (dressing). If you cannot use soap and water, use hand sanitizer. ? Change your bandage as told by your doctor. ? Leave stitches (sutures), skin glue, or skin tape (adhesive) strips in place. They may need to stay in place for 2 weeks or longer. If tape strips get loose and curl up, you may trim the loose edges. Do not remove tape strips completely unless your doctor says it is okay.  Make sure the surgery cuts are clean, dry, and protected.  Check your cut areas every day for signs of infection. Check for: ? More redness, swelling, or pain. ? More fluid or blood. ? Warmth. ? Pus or a bad smell.  If cuts were made in your legs: ? Avoid crossing your legs. ? Avoid sitting for long periods of time. Change positions every 30 minutes. ? Raise (elevate) your legs when you are sitting. Bathing  Do not take baths, swim, or use a hot tub until your doctor says it is okay.  You may shower. Pat the surgery cuts dry. Do not rub the cuts to dry.  Eating and drinking   Eat foods that are high in fiber, such as beans, nuts, whole grains, and raw fruits and vegetables. Any meats you eat should be lean cut.  Avoid canned, processed, and fried foods. This can help prevent trouble pooping. This is also a part of a heart-healthy diet.  Drink enough fluid to keep your pee (urine) pale yellow.  Do not drink alcohol until you are fully recovered. Ask your doctor when it is safe to drink alcohol. Activity  Rest and limit your activity as told by your doctor. You may be told to: ? Stop any activity right away if you have chest pain, shortness of breath, irregular heartbeats, or dizziness. Get help right away if you have any of these symptoms. ? Move around often for short periods or take short walks as told by your doctor. Slowly increase your activities. ? Avoid lifting, pushing, or pulling anything that is heavier than 10 lb (4.5 kg) for at least 6 weeks or as told by your doctor.  Do physical therapy or a cardiac rehab (cardiac rehabilitation) program as told by your doctor. ? Physical therapy involves doing exercises to maintain movement and build strength and endurance. ? A cardiac rehab program includes:  Exercise training.  Education.  Counseling.  Do not drive until your doctor says it is okay.  Ask your doctor when you can go back to work.  Ask your doctor when you can be sexually active. General instructions  Do not drive or use heavy machinery while taking prescription pain medicine.  Do not use any products that contain nicotine or tobacco. These include cigarettes, e-cigarettes, and chewing tobacco. If you need help quitting, ask your doctor.  Take 2-3 deep breaths every few hours during the day while you get better. This helps expand your lungs and prevent problems.  If you were given a device called an incentive spirometer, use it several times a day to practice deep breathing. Support your chest with a pillow or your arms when you take deep breaths or cough.  Wear compression stockings as told by your doctor.  Weigh yourself every day. This helps to see if your body is  holding (retaining) fluid that may make your heart and lungs work harder.  Keep all follow-up visits as told by your doctor. This is important. Contact a doctor if:  You have more redness, swelling, or pain around any cut.  You have more fluid or blood coming from any cut.  Any cut feels warm to the touch.  You have pus or a bad smell coming from any cut.  You have a fever.  You have swelling in your ankles or legs.  You have pain in your legs.  You gain 2 lb (0.9 kg) or more a day.  You feel sick to your stomach or you throw up (vomit).  You have watery poop (diarrhea). Get help right away if:  You have chest pain that goes to your jaw or arms.  You are short of breath.  You have a fast or irregular heartbeat.  You notice a "clicking" in your breastbone (sternum) when you move.  You have any signs of a stroke. "BE FAST" is an easy way to remember the main warning signs: ? B - Balance. Signs are dizziness, sudden trouble walking, or loss of balance. ? E - Eyes. Signs are trouble seeing or a change in how you see. ? F - Face. Signs are sudden weakness or loss of feeling of the face, or the face or eyelid drooping on one side. ? A - Arms. Signs are weakness or loss of feeling in an arm. This happens suddenly and usually on one side of the body. ? S - Speech. Signs are sudden trouble speaking, slurred speech, or trouble understanding what people say. ? T - Time. Time to call emergency services. Write down what time symptoms started.  You have other signs of a stroke, such as: ? A sudden, very bad headache with no known cause. ? Feeling sick to your stomach. ? Throwing up. ? Jerky movements you cannot control (seizure). These symptoms may be an emergency. Do not wait to see if the symptoms will go away. Get medical help right away. Call your local emergency services (911 in the [place] Do not drive yourself to the hospital. Summary  After the procedure, it is common to  have pain or discomfort in the cuts from surgery (incisions).  Do not take baths, swim, or use a hot tub until your doctor says it is okay.  Slowly increase your activities. You may need physical therapy or cardiac rehab.  Weigh yourself every day. This helps to see if your body is holding fluid. This information is not intended to replace advice given  to you by your health care provider. Make sure you discuss any questions you have with your health care provider. Document Revised: 03/23/2018 Document Reviewed: 03/23/2018 Elsevier Patient Education  [employer].  Endoscopic Saphenous Vein Harvesting, Care After This sheet gives you information about how to care for yourself after your procedure. Your health care provider may also give you more specific instructions. If you have problems or questions, contact your health care provider. What can I expect after the procedure? After the procedure, it is common to have:  Pain.  Bruising.  Swelling.  Numbness. Follow these instructions at home: Incision care   Follow instructions from your health care provider about how to take care of your incisions. Make sure you: ? Wash your hands with soap and water before and after you change your bandages (dressings). If soap and water are not available, use hand sanitizer. ? Change your dressings as told by your health care provider. ? Leave stitches (sutures), skin glue, or adhesive strips in place. These skin closures may need to stay in place for 2 weeks or longer. If adhesive strip edges start to loosen and curl up, you may trim the loose edges. Do not remove adhesive strips completely unless your health care provider tells you to do that.  Check your incision areas every day for signs of infection. Check for: ? More redness, swelling, or pain. ? Fluid or blood. ? Warmth. ? Pus or a bad smell. Medicines  Take over-the-counter and prescription medicines only as told by your health  care provider.  Ask your health care provider if the medicine prescribed to you requires you to avoid driving or using heavy machinery. General instructions  Raise (elevate) your legs above the level of your heart while you are sitting or lying down.  Avoid crossing your legs.  Avoid sitting for long periods of time. Change positions every 30 minutes.  Do any exercises your health care providers have given you. These may include deep breathing, coughing, and walking exercises.  Do not take baths, swim, or use a hot tub until your health care provider approves. Ask your health care provider if you may take showers. You may only be allowed to take sponge baths.  Wear compression stockings as told by your health care provider. These stockings help to prevent blood clots and reduce swelling in your legs.  Keep all follow-up visits as told by your health care provider. This is important. Contact a health care provider if:  Medicine does not help your pain.  Your pain gets worse.  You have new leg bruises or your leg bruises get bigger.  Your leg feels numb.  You have more redness, swelling, or pain around your incision.  You have fluid or blood coming from your incision.  Your incision feels warm to the touch.  You have pus or a bad smell coming from your incision.  You have a fever. Get help right away if:  Your pain is severe.  You develop pain, tenderness, warmth, redness, or swelling in any part of your leg.  You have chest pain.  You have trouble breathing. Summary  Raise (elevate) your legs above the level of your heart while you are sitting or lying down.  Wear compression stockings as told by your health care provider.  Make sure you know which symptoms should prompt you to contact your health care provider.  Keep all follow-up visits as told by your health care provider. This information is not intended  to replace advice given to you by your health care  provider. Make sure you discuss any questions you have with your health care provider. Document Revised: 06/21/2018 Document Reviewed: 06/21/2018 Elsevier Patient Education  2020 [employer].

## 2019-10-10 NOTE — Discharge Summary (Signed)
Physician Discharge Summary  Patient ID: Joshua Roy MRN: [id number] DOB/AGE: 71-Apr-1952 71 y.o.  Admit date: 10/07/2019 Discharge date: 10/14/2019  Admission Diagnoses:unstable angina  Discharge Diagnoses:  Active Problems:   S/P CABG x 4  Patient Active Problem List   Diagnosis Date Noted  . S/P CABG x 4 10/07/2019  . Unstable angina ([place]) 09/08/2019  . Personal history of colonic polyps   . Benign neoplasm of ascending colon   . Benign neoplasm of descending colon   . Polyp of sigmoid colon   . OA (osteoarthritis) of knee 07/14/2016  . Medicare annual wellness visit, initial 11/23/2014  . Advance care planning 11/23/2014  . Erectile dysfunction associated with type 2 diabetes mellitus ([place]) 10/03/2013  . Seborrheic keratoses 10/03/2013  . Morbid obesity ([place]) 10/03/2013  . Cough 08/01/2013  . Gastric ulcer 05/16/2013  . CAD (coronary artery disease) 05/21/2011  . SOB (shortness of breath) 05/12/2011  . Knee pain 11/18/2010  . Essential hypertension 01/07/2007  . Uncontrolled type 2 diabetes mellitus with circulatory disorder causing erectile dysfunction ([place]) 01/06/2007  . Hyperlipidemia 01/06/2007  . Irritability 01/06/2007  . DISORDER, TOBACCO USE 01/06/2007  . COPD (chronic obstructive pulmonary disease) ([place]) 01/06/2007    HPI: At time of consultation  The patient is a 69 year old gentleman with history of type 2 diabetes hypertension, hyperlipidemia, longtime 2 pack a day smoker who decreased to 1 pack/day over the past month, bronchitis and COPD and coronary artery disease status post myocardial infarction at age 56. Catheterization at that time showed an occluded mid to distal RCA with otherwise moderate coronary disease. On January 5 he had sudden onset of dizziness and weakness followed by a fall after watching a basketball game and having a drink at a friend's house. His wife picked him up shortly after this and he was witnessed to briefly lose  consciousness. He was taken to the [facility] ED. He was noted to have an elevation of his creatinine to 1.75 with a high-sensitivity troponin of 13. He waited in the emergency room for about 3 hours and felt better and decided to leave. The following day he noted beginning of progressive weight gain and said he gained about 25 to 30 pounds over a couple week period. This was associated with increasing dyspnea on exertion, orthopnea, and lower extremity and scrotal edema. He was seen by his primary physician and referred back to the emergency department on January 9 where he was noted to be hypoxemic with saturations in the 80s on room air. His high-sensitivity troponin was 28 and BNP was 401. He had a CT angio of the chest which was negative for pulmonary embolism but did show mild cardiomegaly with coronary vascular calcification and aortic atherosclerosis. Admission was recommended but he declined. He was discharged from the emergency department and his PCP placed him on Lasix 40 mg daily. His weight began to come down and he was seen in follow-up by cardiology on January 14. A 2D echocardiogram showed normal LV systolic function with grade 1 diastolic dysfunction and no significant valvular disease. He continued to diurese and lost almost 30 pounds of fluid. He continues to have exertional fatigue and shortness of breath. He has not had any chest pain or pressure. He underwent cardiac catheterization on 09/08/2019 which showed a 70% mid to distal left main stenosis. The proximal to mid LAD had 70% stenosis. The left circumflex had an 80% mid vessel stenosis. The proximal RCA had 95% stenosis and was occluded after an  acute marginal branch. The posterior descending branch filled by collaterals. Right heart catheterization showed moderate pulmonary hypertension with PA pressure of 67/23 and a mean of 42. Wedge pressure was 43 if accurate. LVEDP was only 10. Cardiac index was 2.13.  The patient was admitted electively  for coronary artery bypass graft surgery(Dr Cyndia Bent) and right carotid endarterectomy by Curt Jews, MD.   Discharged Condition: good  Hospital Course: Patient was admitted electively and on 10/07/2019 and was taken to the operating where he underwent the below described procedures.  He tolerated it well was taken to the surgical intensive care unit in stable condition.  Postoperative hospital course:  The patient overall progressed nicely.  He has maintained stable hemodynamics.  He did have postoperative atrial fibrillation but has been converted to sinus rhythm on amiodarone.  He was weaned from the ventilator on postoperative day #1 without difficulty using standard protocols.  He does have a significant history of tobacco abuse but is progressing nicely in his overall recovery oxygen is being weaned.  Patient does have postoperative volume overload but is responding to diuretics.  He does have a history of chronic renal insufficiency with a creatinine in the 1.6-1.8 range.  Most recent creatinine on 10/14/2019 was 1.26 with a BUN of 21.  He also had 1 acute blood loss anemia and values are stable.  Most recent hemoglobin hematocrit are 10.5 and 34.9 respectively.  Blood sugars have been under good control using standard protocols.  He will be transitioned to his home diabetic regimen at time of discharge.  He is also been seen by a diabetic coordinator to assist with education.  He has had some postoperative hypertension and has been started on Norvasc.  We have not been able to restart his ARB due to his renal insufficiency.  This may be an option in the future as an outpatient as he further stabilizes.  There he is tolerating gradually increasing activity using standard protocols.  Incisions are noted to be healing well without evidence of infection.  He is tolerating diet.  Oxygen has been difficult to wean and it appears that he will require home oxygen for at least the short-term and this has been  arranged.  He knows to continue aggressive pulmonary toilet and continue to use his  incentive spirometer as well at home.  At the time of discharge the patient is felt to be quite stable.    Consults: None  Significant Diagnostic Studies: routine post-op labs and serial chest Xrays  Treatments: surgery:                                                                             CARDIOVASCULAR SURGERY OPERATIVE NOTE  10/07/2019  Surgeon:  Gaye Pollack, MD  First Assistant: Jadene Pierini,  PA-C   Preoperative Diagnosis:  Severe multi-vessel coronary artery disease   Postoperative Diagnosis:  Same   Procedure:  1. Median Sternotomy 2. Extracorporeal circulation 3.   Coronary artery bypass grafting x 4   Left internal mammary graft to the LAD  SVG to Ramus  SVG to OM  SVG to PDA   4.   Endoscopic vein harvest from the left leg   Anesthesia:  General Endotracheal  DATE OF SURGERY: 10/07/2019  PATIENT: Joshua Roy, 69 y.o. male MRN: [id number]  DOB: 1950-08-27  PRE-OPERATIVE DIAGNOSIS: Right Carotid Stenosis, Asymptomatic  POST-OPERATIVE DIAGNOSIS:  Same  PROCEDURE:  Right Carotid Endarterectomy with Dacron Patch Angioplasty  SURGEON:  Curt Jews, M.D.  PHYSICIAN ASSISTANT: Remi Haggard PA-C  ANESTHESIA:   general Discharge Exam: Blood pressure (!) 147/68, pulse 66, temperature 98.3 F (36.8 C), temperature source Oral, resp. rate (!) 23, height 5\' 9"  (1.753 m), weight 114.8 kg, SpO2 96 %.  General appearance: alert, cooperative and no distress Heart: regular rate and rhythm Lungs: coarse BS Abdomen: benign Extremities: + edema Wound: incis healing well  Disposition: Discharge disposition: 01-Home or Self Care       Discharge Instructions    Amb Referral to Cardiac Rehabilitation   Complete by: As directed    Diagnosis: CABG   CABG X ___: 4   After initial evaluation and assessments completed: Virtual Based  Care may be provided alone or in conjunction with Phase 2 Cardiac Rehab based on patient barriers.: Yes   Discharge patient   Complete by: As directed    With home O2 arranged   Discharge disposition: 01-Home or Self Care   Discharge patient date: 10/14/2019     Allergies as of 10/14/2019      Reactions   Ramipril Cough   Mucinex D [pseudoephedrine-guaifenesin Er] Other (See Comments), Hypertension   Elevated BP, insomnia      Medication List    STOP taking these medications   losartan-hydrochlorothiazide 50-12.5 MG tablet Commonly known as: HYZAAR   nitroGLYCERIN 0.4 MG SL tablet Commonly known as: NITROSTAT     TAKE these medications   accu-chek multiclix lancets Use as instructed to check blood sugar once daily or as needed.  Diagnosis:  E11.9   Non insulin-dependent.   acetaminophen 500 MG tablet Commonly known as: TYLENOL Take 1,000 mg by mouth every 8 (eight) hours as needed for mild pain or headache.   ALPRAZolam 0.25 MG tablet Commonly known as: XANAX Take 0.25 mg by mouth 2 (two) times daily as needed for anxiety.   amiodarone 400 MG tablet Commonly known as: PACERONE Take 1 tablet (400 mg total) by mouth 2 (two) times daily. For 7 days then once daily   amLODipine 5 MG tablet Commonly known as: NORVASC Take 1 tablet (5 mg total) by mouth daily.   aspirin EC 81 MG tablet Take 1 tablet (81 mg total) by mouth daily.   atorvastatin 80 MG tablet Commonly known as: LIPITOR Take 1 tablet (80 mg total) by mouth daily.   clopidogrel 75 MG tablet Commonly known as: PLAVIX Take 75 mg by mouth daily.   fenofibrate 160 MG tablet Take 1 tablet (160 mg total) by mouth daily.   furosemide 40 MG tablet Commonly known as: LASIX Take 1 tablet (40 mg total) by mouth daily. What changed: how much to take   glimepiride 2 MG tablet Commonly known as: AMARYL Take one half tablet (1 mg) by mouth each morning What changed:   how much to take  how to take  this  when to take this  additional instructions Notes to patient: Take tomorrow AM 10/15/2019   glucose blood test strip Commonly known as: Accu-Chek Aviva Plus Use to check blood sugar once daily or as needed.  Diagnosis:  E11.9   Non insulin-dependent.   metFORMIN 1000 MG tablet Commonly known as: GLUCOPHAGE Take 1 tablet (1,000 mg total) by mouth  2 (two) times daily.   metoprolol tartrate 25 MG tablet Commonly known as: LOPRESSOR Take 1 tablet (25 mg total) by mouth 2 (two) times daily.   nicotine 21 mg/24hr patch Commonly known as: NICODERM CQ - dosed in mg/24 hours Place 21 mg onto the skin daily as needed (nicotine dependence).   pantoprazole 40 MG tablet Commonly known as: PROTONIX Take 40 mg by mouth daily before breakfast.   Potassium Chloride ER 20 MEQ Tbcr Take 20 mEq by mouth daily.   potassium chloride SA 20 MEQ tablet Commonly known as: KLOR-CON Take 20 mEq by mouth daily.   traMADol 50 MG tablet Commonly known as: ULTRAM Take 1 tablet (50 mg total) by mouth every 6 (six) hours as needed for up to 7 days for moderate pain.            Durable Medical Equipment  (From admission, onward)         Start     Ordered   10/13/19 0715  For home use only DME oxygen  Once    Question Answer Comment  Length of Need 6 Months   Mode or (Route) Nasal cannula   Liters per Minute 3   Frequency Continuous (stationary and portable oxygen unit needed)   Oxygen delivery system Gas      10/13/19 0714         Follow-up Information    Early, Arvilla Meres, MD.   Specialties: Vascular Surgery, Cardiology Why: 2-3 weeks. The office will call the patient with an appointment (sent) Contact information: [address] [place] [address] [phone]        Gaye Pollack, MD Follow up.   Specialty: Cardiothoracic Surgery Why: Please see discharge paperwork for follow-up with Dr. Cyndia Bent and suture removal.  Obtain a chest x-ray at [facility] 1/2-hour  prior to appointment with Dr. Cyndia Bent.  It is located in the same office complex. Contact information: [address] [place] [address] [phone]        Minna Merritts, MD Follow up.   Specialty: Cardiology Why: see discharge paperwork Contact information: [place] [address] [phone]        [facility] Follow up.   Why: home oxygen Contact information: [place] [address] [phone]          The patient has been discharged on:   1.Beta Blocker:  Yes y                              No                                 If No, reason:  2.Ace Inhibitor/ARB: Yes                                     No n                                     If No, reason:acute on chronic renal insuff  3.Statin:   Yes y                  No  If No, reason:  4.Ecasa:  Yes y                  No                     If No, reason:  Signed: [name] PA-C 10/14/2019, 12:55 PM

## 2019-10-10 NOTE — Progress Notes (Signed)
Patient ID: Joshua Roy, male   DOB: May 26, 1951, 69 y.o.   MRN: [id number]  Progress Note    10/10/2019 10:58 AM 3 Days Post-Op  Subjective: Alert oriented   Vitals:   10/10/19 0600 10/10/19 0700  BP: 139/72 (!) 149/73  Pulse: 74 (!) 110  Resp: (!) 28 (!) 22  Temp:  98.3 F (36.8 C)  SpO2: 90% 91%   Physical Exam: Right neck incision healing nicely with no evidence of hematoma.  Neurologically intact  CBC    Component Value Date/Time   WBC 10.4 10/10/2019 0151   RBC 4.92 10/10/2019 0151   HGB 11.6 (L) 10/10/2019 0151   HGB 15.3 08/31/2019 1511   HCT 39.2 10/10/2019 0151   HCT 49.0 08/31/2019 1511   PLT 193 10/10/2019 0151   PLT 360 08/31/2019 1511   MCV 79.7 (L) 10/10/2019 0151   MCV 74 (L) 08/31/2019 1511   MCV 83 05/14/2013 0252   MCH 23.6 (L) 10/10/2019 0151   MCHC 29.6 (L) 10/10/2019 0151   RDW 20.6 (H) 10/10/2019 0151   RDW 16.8 (H) 08/31/2019 1511   RDW 14.6 (H) 05/14/2013 0252   LYMPHSABS 2.4 08/31/2019 1511   LYMPHSABS 2.7 05/14/2013 0252   MONOABS 0.4 05/14/2013 0252   EOSABS 0.0 08/31/2019 1511   EOSABS 0.1 05/14/2013 0252   BASOSABS 0.1 08/31/2019 1511   BASOSABS 0.1 05/14/2013 0252    BMET    Component Value Date/Time   NA 135 10/10/2019 0151   NA 137 08/31/2019 1511   NA 140 05/14/2013 0252   K 4.3 10/10/2019 0151   K 3.9 05/14/2013 0252   CL 99 10/10/2019 0151   CL 105 05/14/2013 0252   CO2 29 10/10/2019 0151   CO2 29 05/14/2013 0252   GLUCOSE 179 (H) 10/10/2019 0151   GLUCOSE 115 (H) 05/14/2013 0252   BUN 21 10/10/2019 0151   BUN 32 (H) 08/31/2019 1511   BUN 34 (H) 05/14/2013 0252   CREATININE 1.65 (H) 10/10/2019 0151   CREATININE 1.17 09/26/2013 0944   CALCIUM 8.4 (L) 10/10/2019 0151   CALCIUM 8.6 05/14/2013 0252   GFRNONAA 41 (L) 10/10/2019 0151   GFRNONAA >60 05/14/2013 0252   GFRAA 48 (L) 10/10/2019 0151   GFRAA >60 05/14/2013 0252    INR    Component Value Date/Time   INR 1.4 (H) 10/07/2019 1619   INR 1.0  05/14/2013 0252     Intake/Output Summary (Last 24 hours) at 10/10/2019 1058 Last data filed at 10/10/2019 0500 Gross per 24 hour  Intake 518.52 ml  Output 1385 ml  Net -866.48 ml     Assessment/Plan:  69 y.o. male stable postop day 3 from right carotid endarterectomy in conjunction with coronary artery bypass grafting.  We will see in the office in 3 weeks.  Will follow peripherally while inpatient     Rosetta Posner, MD [facility] [phone] 10/10/2019 10:58 AM

## 2019-10-10 NOTE — Progress Notes (Signed)
CT surgery p.m. Rounds  Resting comfortably O2 sats 95% on nasal cannula 5 L Maintaining sinus rhythm -580 cc fluid balance today  Vitals:   10/10/19 2100 10/10/19 2200  BP: 119/70 140/69  Pulse: 75 75  Resp: (!) 24 (!) 23  Temp:    SpO2: 98% 94%

## 2019-10-10 NOTE — Progress Notes (Signed)
3 Days Post-Op Procedure(s) (LRB): CORONARY ARTERY BYPASS GRAFTING (CABG) using LIMA to LAD; Endoscopic harvesting of left greater saphenous vein: SVG to RAMUS; SVG to OM1; SVG to PDA. (N/A) TRANSESOPHAGEAL ECHOCARDIOGRAM (TEE) (N/A) ENDARTERECTOMY CAROTID (Right) Endovein Harvest Of Greater Saphenous Vein (Left) Subjective: No complaints. Ambulated this am.  Objective: Vital signs in last 24 hours: Temp:  [97.9 F (36.6 C)-99.4 F (37.4 C)] 98.3 F (36.8 C) (03/15 0700) Pulse Rate:  [65-110] 110 (03/15 0700) Cardiac Rhythm: Normal sinus rhythm (03/15 0400) Resp:  [0-28] 22 (03/15 0700) BP: (129-165)/(61-85) 149/73 (03/15 0700) SpO2:  [85 %-97 %] 91 % (03/15 0700) Weight:  [333 kg] 119 kg (03/15 0500)  Hemodynamic parameters for last 24 hours:    Intake/Output from previous day: 03/14 0701 - 03/15 0700 In: 928.6 [P.O.:840; I.V.:88.6] Out: 1505 [Urine:1495; Chest Tube:10] Intake/Output this shift: No intake/output data recorded.  General appearance: alert and cooperative Neurologic: intact Heart: regular rate and rhythm, S1, S2 normal, no murmur Lungs: clear to auscultation bilaterally Extremities: edema moderate Wound: dressings dry  Lab Results: Recent Labs    10/09/19 0359 10/10/19 0151  WBC 13.8* 10.4  HGB 11.7* 11.6*  HCT 40.2 39.2  PLT 206 193   BMET:  Recent Labs    10/09/19 0359 10/10/19 0151  NA 134* 135  K 4.5 4.3  CL 100 99  CO2 24 29  GLUCOSE 196* 179*  BUN 21 21  CREATININE 1.51* 1.65*  CALCIUM 8.2* 8.4*    PT/INR:  Recent Labs    10/07/19 1619  LABPROT 16.6*  INR 1.4*   ABG    Component Value Date/Time   PHART 7.362 10/08/2019 1023   HCO3 22.1 10/08/2019 1023   TCO2 23 10/08/2019 1023   ACIDBASEDEF 3.0 (H) 10/08/2019 1023   O2SAT 93.0 10/08/2019 1023   CBG (last 3)  Recent Labs    10/09/19 2113 10/10/19 0014 10/10/19 0342  GLUCAP 221* 185* 152*   CXR: bibasilar atelectasis  Assessment/Plan: S/P Procedure(s)  (LRB): CORONARY ARTERY BYPASS GRAFTING (CABG) using LIMA to LAD; Endoscopic harvesting of left greater saphenous vein: SVG to RAMUS; SVG to OM1; SVG to PDA. (N/A) TRANSESOPHAGEAL ECHOCARDIOGRAM (TEE) (N/A) ENDARTERECTOMY CAROTID (Right) Endovein Harvest Of Greater Saphenous Vein (Left)  POD 3 Hemodynamically stable in sinus rhythm. Increase Lopressor to 25 bid.  Postop atrial fib converted on amio. Continue 400 bid. Check ECG in am.  Volume excess: 9 lbs over preop. Continue diuretic. Will add low dose metolazone for a couple days.  Elevated creat postop. Creat was 1.6-1.8 in January but down to 1.42 preop. Continue to follow.  Poorly controlled DM with Hgb A1c of 8.2 preop. Change Levemir to 30 daily and continue SSI.  Heavy smoking ( 2 ppd to 1 ppd) and COPD with preop PCO2 of 53 and PO2 of 63, sat 91% on room air. Continue nebs, IS.  Continue mobilization.    LOS: 3 days    Gaye Pollack 10/10/2019

## 2019-10-11 ENCOUNTER — Telehealth (HOSPITAL_COMMUNITY): Payer: Self-pay | Admitting: Vascular Surgery

## 2019-10-11 LAB — BASIC METABOLIC PANEL
Anion gap: 11 (ref 5–15)
BUN: 29 mg/dL — ABNORMAL HIGH (ref 8–23)
CO2: 29 mmol/L (ref 22–32)
Calcium: 8.4 mg/dL — ABNORMAL LOW (ref 8.9–10.3)
Chloride: 95 mmol/L — ABNORMAL LOW (ref 98–111)
Creatinine, Ser: 1.81 mg/dL — ABNORMAL HIGH (ref 0.61–1.24)
GFR calc Af Amer: 43 mL/min — ABNORMAL LOW (ref >60)
GFR calc non Af Amer: 37 mL/min — ABNORMAL LOW (ref >60)
Glucose, Bld: 145 mg/dL — ABNORMAL HIGH (ref 70–99)
Potassium: 4.1 mmol/L (ref 3.5–5.1)
Sodium: 135 mmol/L (ref 135–145)

## 2019-10-11 LAB — LIPID PANEL
Cholesterol: 84 mg/dL (ref 0–200)
HDL: 26 mg/dL — ABNORMAL LOW (ref >40)
LDL Cholesterol: 39 mg/dL (ref 0–99)
Total CHOL/HDL Ratio: 3.2 RATIO
Triglycerides: 93 mg/dL (ref <150)
VLDL: 19 mg/dL (ref 0–40)

## 2019-10-11 LAB — GLUCOSE, CAPILLARY
Glucose-Capillary: 167 mg/dL — ABNORMAL HIGH (ref 70–99)
Glucose-Capillary: 133 mg/dL — ABNORMAL HIGH (ref 70–99)
Glucose-Capillary: 237 mg/dL — ABNORMAL HIGH (ref 70–99)
Glucose-Capillary: 142 mg/dL — ABNORMAL HIGH (ref 70–99)
Glucose-Capillary: 179 mg/dL — ABNORMAL HIGH (ref 70–99)
Glucose-Capillary: 141 mg/dL — ABNORMAL HIGH (ref 70–99)

## 2019-10-11 MED ORDER — TRAMADOL HCL 50 MG PO TABS: 50.0000 mg | ORAL_TABLET | Freq: Four times a day (QID) | ORAL | Status: DC | PRN

## 2019-10-11 MED ORDER — PANTOPRAZOLE SODIUM 40 MG PO TBEC
40.0000 mg | DELAYED_RELEASE_TABLET | Freq: Every day | ORAL | Status: DC
  Administered 2019-10-12 – 2019-10-14 (×3): 40 mg via ORAL
  Filled 2019-10-11 (×3): qty 1

## 2019-10-11 MED ORDER — ASPIRIN EC 325 MG PO TBEC
325.0000 mg | DELAYED_RELEASE_TABLET | Freq: Every day | ORAL | Status: DC
  Administered 2019-10-12 – 2019-10-14 (×3): 325 mg via ORAL
  Filled 2019-10-11 (×3): qty 1

## 2019-10-11 MED ORDER — ONDANSETRON HCL 4 MG PO TABS: 4.0000 mg | ORAL_TABLET | Freq: Four times a day (QID) | ORAL | Status: DC | PRN

## 2019-10-11 MED ORDER — BISACODYL 5 MG PO TBEC: 10.0000 mg | DELAYED_RELEASE_TABLET | Freq: Every day | ORAL | Status: DC | PRN

## 2019-10-11 MED ORDER — FUROSEMIDE 40 MG PO TABS
40.0000 mg | ORAL_TABLET | Freq: Every day | ORAL | Status: DC
  Administered 2019-10-11 – 2019-10-14 (×4): 40 mg via ORAL
  Filled 2019-10-11 (×4): qty 1

## 2019-10-11 MED ORDER — METOPROLOL TARTRATE 25 MG PO TABS
25.0000 mg | ORAL_TABLET | Freq: Two times a day (BID) | ORAL | Status: DC
  Administered 2019-10-11 – 2019-10-14 (×5): 25 mg via ORAL
  Filled 2019-10-11 (×6): qty 1

## 2019-10-11 MED ORDER — BISACODYL 10 MG RE SUPP: 10.0000 mg | Freq: Every day | RECTAL | Status: DC | PRN

## 2019-10-11 MED ORDER — OXYCODONE HCL 5 MG PO TABS: 5.0000 mg | ORAL_TABLET | ORAL | Status: DC | PRN

## 2019-10-11 MED ORDER — ACETAMINOPHEN 325 MG PO TABS: 650.0000 mg | ORAL_TABLET | Freq: Four times a day (QID) | ORAL | Status: DC | PRN

## 2019-10-11 MED ORDER — DOCUSATE SODIUM 100 MG PO CAPS
200.0000 mg | ORAL_CAPSULE | Freq: Every day | ORAL | Status: DC
  Administered 2019-10-12 – 2019-10-14 (×2): 200 mg via ORAL
  Filled 2019-10-11 (×3): qty 2

## 2019-10-11 MED ORDER — SODIUM CHLORIDE 0.9 % IV SOLN: 250.0000 mL | INTRAVENOUS | Status: DC | PRN

## 2019-10-11 MED ORDER — CHLORHEXIDINE GLUCONATE CLOTH 2 % EX PADS: 6.0000 | MEDICATED_PAD | Freq: Every day | CUTANEOUS | Status: DC

## 2019-10-11 MED ORDER — IPRATROPIUM-ALBUTEROL 0.5-2.5 (3) MG/3ML IN SOLN
3.0000 mL | Freq: Four times a day (QID) | RESPIRATORY_TRACT | Status: DC
  Administered 2019-10-11: 3 mL via RESPIRATORY_TRACT
  Filled 2019-10-11: qty 3

## 2019-10-11 MED ORDER — INSULIN ASPART 100 UNIT/ML ~~LOC~~ SOLN
0.0000 [IU] | Freq: Three times a day (TID) | SUBCUTANEOUS | Status: DC
  Administered 2019-10-11 (×2): 2 [IU] via SUBCUTANEOUS
  Administered 2019-10-11: 4 [IU] via SUBCUTANEOUS
  Administered 2019-10-12 (×3): 2 [IU] via SUBCUTANEOUS
  Administered 2019-10-13: 4 [IU] via SUBCUTANEOUS
  Administered 2019-10-13 (×2): 2 [IU] via SUBCUTANEOUS

## 2019-10-11 MED ORDER — SODIUM CHLORIDE 0.9% FLUSH: 3.0000 mL | INTRAVENOUS | Status: DC | PRN

## 2019-10-11 MED ORDER — ONDANSETRON HCL 4 MG/2ML IJ SOLN: 4.0000 mg | Freq: Four times a day (QID) | INTRAMUSCULAR | Status: DC | PRN

## 2019-10-11 MED ORDER — ~~LOC~~ CARDIAC SURGERY, PATIENT & FAMILY EDUCATION: Freq: Once | Status: AC

## 2019-10-11 MED ORDER — INSULIN DETEMIR 100 UNIT/ML ~~LOC~~ SOLN
20.0000 [IU] | Freq: Two times a day (BID) | SUBCUTANEOUS | Status: DC
  Administered 2019-10-12 – 2019-10-14 (×5): 20 [IU] via SUBCUTANEOUS
  Filled 2019-10-11 (×6): qty 0.2

## 2019-10-11 MED ORDER — SODIUM CHLORIDE 0.9% FLUSH
3.0000 mL | Freq: Two times a day (BID) | INTRAVENOUS | Status: DC
  Administered 2019-10-11 – 2019-10-13 (×5): 3 mL via INTRAVENOUS

## 2019-10-11 NOTE — Plan of Care (Signed)
  Problem: Cardiac: Goal: Will achieve and/or maintain hemodynamic stability Outcome: Progressing   Problem: Activity: Goal: Ability to tolerate increased activity will improve Outcome: Progressing   Problem: Education: Goal: Knowledge of General Education information will improve Description: Including pain rating scale, medication(s)/side effects and non-pharmacologic comfort measures Outcome: Progressing   Problem: Health Behavior/Discharge Planning: Goal: Ability to manage health-related needs will improve Outcome: Progressing   Problem: Clinical Measurements: Goal: Ability to maintain clinical measurements within normal limits will improve Outcome: Progressing Goal: Will remain free from infection Outcome: Progressing Goal: Diagnostic test results will improve Outcome: Progressing Goal: Respiratory complications will improve Outcome: Progressing Goal: Cardiovascular complication will be avoided Outcome: Progressing   Problem: Activity: Goal: Risk for activity intolerance will decrease Outcome: Progressing   Problem: Nutrition: Goal: Adequate nutrition will be maintained Outcome: Progressing   Problem: Coping: Goal: Level of anxiety will decrease Outcome: Progressing   Problem: Elimination: Goal: Will not experience complications related to bowel motility Outcome: Progressing Goal: Will not experience complications related to urinary retention Outcome: Progressing   Problem: Pain Managment: Goal: General experience of comfort will improve Outcome: Progressing   Problem: Safety: Goal: Ability to remain free from injury will improve Outcome: Progressing   Problem: Skin Integrity: Goal: Risk for impaired skin integrity will decrease Outcome: Progressing

## 2019-10-11 NOTE — Progress Notes (Signed)
[address]       [place] [address]             [phone]      4 Days Post-Op Procedure(s) (LRB): CORONARY ARTERY BYPASS GRAFTING (CABG) using LIMA to LAD; Endoscopic harvesting of left greater saphenous vein: SVG to RAMUS; SVG to OM1; SVG to PDA. (N/A) TRANSESOPHAGEAL ECHOCARDIOGRAM (TEE) (N/A) ENDARTERECTOMY CAROTID (Right) Endovein Harvest Of Greater Saphenous Vein (Left) Subjective: Feels ok, not much appetite  Objective: Vital signs in last 24 hours: Temp:  [98.1 F (36.7 C)-99.2 F (37.3 C)] 99 F (37.2 C) (03/16 0400) Pulse Rate:  [71-110] 73 (03/16 0600) Cardiac Rhythm: Normal sinus rhythm (03/16 0400) Resp:  [17-28] 23 (03/16 0600) BP: (109-151)/(62-106) 119/94 (03/16 0600) SpO2:  [78 %-98 %] 93 % (03/16 0600) Weight:  [119.1 kg] 119.1 kg (03/16 0500)  Hemodynamic parameters for last 24 hours:    Intake/Output from previous day: 03/15 0701 - 03/16 0700 In: 83 [I.V.:83] Out: 1915 [Urine:1915] Intake/Output this shift: No intake/output data recorded.  General appearance: alert, cooperative and no distress Heart: regular rate and rhythm Lungs: somewhat coarse , mildly dim in the bases Abdomen: benign Extremities: some edema Wound: incis healing well Neuro: intact  Lab Results: Recent Labs    10/09/19 0359 10/10/19 0151  WBC 13.8* 10.4  HGB 11.7* 11.6*  HCT 40.2 39.2  PLT 206 193   BMET:  Recent Labs    10/10/19 0151 10/11/19 0254  NA 135 135  K 4.3 4.1  CL 99 95*  CO2 29 29  GLUCOSE 179* 145*  BUN 21 29*  CREATININE 1.65* 1.81*  CALCIUM 8.4* 8.4*    PT/INR: No results for input(s): LABPROT, INR in the last 72 hours. ABG    Component Value Date/Time   PHART 7.362 10/08/2019 1023   HCO3 22.1 10/08/2019 1023   TCO2 23 10/08/2019 1023   ACIDBASEDEF 3.0 (H) 10/08/2019 1023   O2SAT 93.0 10/08/2019 1023   CBG (last 3)  Recent Labs    10/10/19 2350 10/11/19 0415 10/11/19 0656  GLUCAP 128* 133* 237*     Meds Scheduled Meds: . acetaminophen  1,000 mg Oral Q6H   Or  . acetaminophen (TYLENOL) oral liquid 160 mg/5 mL  1,000 mg Per Tube Q6H  . amiodarone  400 mg Oral BID  . aspirin EC  325 mg Oral Daily   Or  . aspirin  324 mg Per Tube Daily  . atorvastatin  80 mg Oral Daily  . bisacodyl  10 mg Oral Daily   Or  . bisacodyl  10 mg Rectal Daily  . chlorhexidine  15 mL Mouth Rinse BID  . Chlorhexidine Gluconate Cloth  6 each Topical Daily  . colchicine  0.3 mg Oral BID  . docusate sodium  200 mg Oral Daily  . furosemide  60 mg Intravenous BID  . insulin aspart  3-9 Units Subcutaneous Q4H  . insulin detemir  30 Units Subcutaneous Daily  . mouth rinse  15 mL Mouth Rinse BID  . metolazone  2.5 mg Oral Daily  . metoprolol tartrate  25 mg Oral BID   Or  . metoprolol tartrate  25 mg Per Tube BID  . pantoprazole  40 mg Oral Daily  . sodium chloride flush  3 mL Intravenous Q12H   Continuous Infusions: . sodium chloride     PRN Meds:.dextrose, levalbuterol, metoprolol tartrate, ondansetron (ZOFRAN) IV, oxyCODONE, sodium chloride flush, traMADol  Xrays DG Chest  Port 1 View  Result Date: 10/10/2019 CLINICAL DATA:  Chest soreness post CABG EXAM: PORTABLE CHEST 1 VIEW COMPARISON:  Portable exam 0550 hours compared to 10/09/2019 FINDINGS: Interval removal of LEFT jugular line, LEFT thoracostomy tube, and mediastinal drain. Enlargement of cardiac silhouette with pulmonary vascular congestion. Atherosclerotic calcification aorta. Bibasilar atelectasis. No definite infiltrate, pleural effusion or pneumothorax. IMPRESSION: Bibasilar atelectasis. Electronically Signed   By: Lavonia Dana M.D.   On: 10/10/2019 07:59    Assessment/Plan: S/P Procedure(s) (LRB): CORONARY ARTERY BYPASS GRAFTING (CABG) using LIMA to LAD; Endoscopic harvesting of left greater saphenous vein: SVG to RAMUS; SVG to OM1; SVG to PDA. (N/A) TRANSESOPHAGEAL ECHOCARDIOGRAM (TEE) (N/A) ENDARTERECTOMY CAROTID (Right) Endovein  Harvest Of Greater Saphenous Vein (Left)  POD#4 1 doing well overall 2 hemodyn stable with HTN at times, Creat now 1.81 so cant restart ARB at this point, follow for now but may need additional agent like norvasc 3 sats ok on 5 liters, cont pulm toilet, wean as able- will add duoneb for short time 4 volume overload , reasonable uop, may have to adjust diuretics with slowly rising creat 5 will d/c foley today 6 CBG's reasonable control, will need to transition to home meds, will have diabetic coordinator see also 7 poss transfer out of unit  LOS: 4 days    Joshua Giovanni PA-C Pager [phone] 10/11/2019

## 2019-10-11 NOTE — Progress Notes (Signed)
      [address]       [place] [address]             [phone]      POD # 4 CABG/ CEA  Resting comfortably  BP 122/60 (BP Location: Right Arm)   Pulse 65   Temp 98 F (36.7 C) (Oral)   Resp (!) 23   Ht 5\' 9"  (1.753 m)   Wt 119.1 kg   SpO2 97%   BMI 38.77 kg/m   Intake/Output Summary (Last 24 hours) at 10/11/2019 1832 Last data filed at 10/11/2019 1700 Gross per 24 hour  Intake 600 ml  Output 1700 ml  Net -1100 ml   Continue current Rx  Mickey Esguerra C. Roxan Hockey, MD [facility] [phone]

## 2019-10-11 NOTE — Telephone Encounter (Signed)
Left pt message giving NEW CHF appt w/ db 3/22 @ 11am , asked pt to call back to confirm appt

## 2019-10-12 LAB — GLUCOSE, CAPILLARY
Glucose-Capillary: 136 mg/dL — ABNORMAL HIGH (ref 70–99)
Glucose-Capillary: 117 mg/dL — ABNORMAL HIGH (ref 70–99)
Glucose-Capillary: 127 mg/dL — ABNORMAL HIGH (ref 70–99)
Glucose-Capillary: 148 mg/dL — ABNORMAL HIGH (ref 70–99)
Glucose-Capillary: 144 mg/dL — ABNORMAL HIGH (ref 70–99)

## 2019-10-12 LAB — BASIC METABOLIC PANEL
Anion gap: 13 (ref 5–15)
BUN: 30 mg/dL — ABNORMAL HIGH (ref 8–23)
CO2: 31 mmol/L (ref 22–32)
Calcium: 8.4 mg/dL — ABNORMAL LOW (ref 8.9–10.3)
Chloride: 92 mmol/L — ABNORMAL LOW (ref 98–111)
Creatinine, Ser: 1.62 mg/dL — ABNORMAL HIGH (ref 0.61–1.24)
GFR calc Af Amer: 49 mL/min — ABNORMAL LOW (ref >60)
GFR calc non Af Amer: 42 mL/min — ABNORMAL LOW (ref >60)
Glucose, Bld: 107 mg/dL — ABNORMAL HIGH (ref 70–99)
Potassium: 3.6 mmol/L (ref 3.5–5.1)
Sodium: 136 mmol/L (ref 135–145)

## 2019-10-12 LAB — CBC
HCT: 35.3 % — ABNORMAL LOW (ref 39.0–52.0)
Hemoglobin: 10.4 g/dL — ABNORMAL LOW (ref 13.0–17.0)
MCH: 23.1 pg — ABNORMAL LOW (ref 26.0–34.0)
MCHC: 29.5 g/dL — ABNORMAL LOW (ref 30.0–36.0)
MCV: 78.4 fL — ABNORMAL LOW (ref 80.0–100.0)
Platelets: 233 10*3/uL (ref 150–400)
RBC: 4.5 MIL/uL (ref 4.22–5.81)
RDW: 20.2 % — ABNORMAL HIGH (ref 11.5–15.5)
WBC: 5.3 10*3/uL (ref 4.0–10.5)
nRBC: 0.4 % — ABNORMAL HIGH (ref 0.0–0.2)

## 2019-10-12 MED ORDER — METOLAZONE 5 MG PO TABS
2.5000 mg | ORAL_TABLET | Freq: Every day | ORAL | Status: AC
  Administered 2019-10-12 – 2019-10-13 (×2): 2.5 mg via ORAL
  Filled 2019-10-12 (×2): qty 1

## 2019-10-12 MED ORDER — NICOTINE 7 MG/24HR TD PT24
7.0000 mg | MEDICATED_PATCH | Freq: Every day | TRANSDERMAL | Status: DC
  Administered 2019-10-12 – 2019-10-13 (×2): 7 mg via TRANSDERMAL
  Filled 2019-10-12 (×3): qty 1

## 2019-10-12 MED ORDER — POTASSIUM CHLORIDE CRYS ER 20 MEQ PO TBCR
20.0000 meq | EXTENDED_RELEASE_TABLET | Freq: Three times a day (TID) | ORAL | Status: AC
  Administered 2019-10-12 – 2019-10-13 (×6): 20 meq via ORAL
  Filled 2019-10-12 (×6): qty 1

## 2019-10-12 NOTE — Care Management (Signed)
Benefit check sent for colchicine

## 2019-10-12 NOTE — Progress Notes (Signed)
Inpatient Diabetes Program Recommendations  AACE/ADA: New Consensus Statement on Inpatient Glycemic Control (2015)  Target Ranges:  Prepandial:   less than 140 mg/dL      Peak postprandial:   less than 180 mg/dL (1-2 hours)      Critically ill patients:  140 - 180 mg/dL   Lab Results  Component Value Date   GLUCAP 148 (H) 10/12/2019   HGBA1C 8.2 (H) 10/05/2019    Review of Glycemic Control  Diabetes history: DM2 Outpatient Diabetes medications: Amaryl 2 mg QAM, metformin 1000 mg bid Current orders for Inpatient glycemic control: Levemir 20 units bid, Novolog 0-24 units tidwc and hs  HgbA1C - 8.2%. - Goal is 7-7.5%. Eating 5-50% meals  Inpatient Diabetes Program Recommendations:     Agree with orders.  Spoke with pt about his diabetes control at home. Discussed HgbA1C of 8.2% (average 190 mg/dL) and goal of 7-7.5% to reduce risks of complications from diabetes. Pt states he sees PCP in [place] every 3 months. Checks blood sugars every morning and tries to eat healthy diet. States blood sugars usually run around 120-130 mg/dL at home. Got off track with diet around the Thanksgiving/Christmas holiday. Recommended for pt to check blood sugar at different times each day, like bedtime or 2H after a meal. Instructed to take blood sugar log to MD for any adjustments that might be needed to DM meds. Answered questions. Pt seems to have good understanding of his diabetes.  Thank you. Lorenda Peck, RD, LDN, CDE Inpatient Diabetes Coordinator [phone]

## 2019-10-12 NOTE — Care Management (Signed)
Per Chloe G. W[facility] @ [phone]: Co-pay amount for Colchicine 0.6 mg. fpr 30 day supply bid $323.74. Does not come in 0.47m only 0.6 mg. No PA required Deductible not met. [facility]  REF.# Chloe G.

## 2019-10-12 NOTE — Progress Notes (Signed)
CARDIAC REHAB PHASE I   PRE:  Rate/Rhythm: 92 Afib  BP:  Sitting: 113/61      SaO2: 100 5L --> 84 RA --> 92 3L  MODE:  Ambulation: 400 ft   POST:  Rate/Rhythm: 116 Afib  BP:  Sitting: 98/58    SaO2: 93 3L  Pt ambulated 438ft in hallway standby assist with front wheel walker. Pt states his legs feel stronger everyday, some SOB present. Pt helped into bed. BP noted to be lower after walk, pt denied dizziness or lightheadedness. Encouraged continued IS use and walks. Will continue to follow and try to wean O2.  [id number] Rufina Falco, RN BSN 10/12/2019 3:18 PM

## 2019-10-12 NOTE — Progress Notes (Signed)
SATURATION QUALIFICATIONS: (This note is used to comply with regulatory documentation for home oxygen)  Patient Saturations on Room Air at Rest = 84%  Patient Saturations on Room Air while Ambulating = not done, pt sats already low on RA at rest  Patient Saturations on 3 Liters of oxygen while Ambulating = 92%  Please briefly explain why patient needs home oxygen: Yes, pt needs oxygen to maintain sats >90%.

## 2019-10-12 NOTE — Progress Notes (Signed)
   10/12/19 1200  Mobility  Activity Ambulated in hall  Range of Motion/Exercises Active;All extremities  Level of Assistance Modified independent, requires aide device or extra time  Assistive Device Four wheel walker  Minutes Sat in Chair 300 minutes  Minutes Ambulated 10 minutes  Distance Ambulated (ft) 780 ft  Mobility Response Tolerated well  Mobility performed by Other (comment)  Bed Position Semi-fowlers

## 2019-10-12 NOTE — Progress Notes (Signed)
5 Days Post-Op Procedure(s) (LRB): CORONARY ARTERY BYPASS GRAFTING (CABG) using LIMA to LAD; Endoscopic harvesting of left greater saphenous vein: SVG to RAMUS; SVG to OM1; SVG to PDA. (N/A) TRANSESOPHAGEAL ECHOCARDIOGRAM (TEE) (N/A) ENDARTERECTOMY CAROTID (Right) Endovein Harvest Of Greater Saphenous Vein (Left) Subjective:  No complaints. Ambulating. Still on oxygen. Bowels working.  Objective: Vital signs in last 24 hours: Temp:  [97.6 F (36.4 C)-99.3 F (37.4 C)] 97.6 F (36.4 C) (03/17 0822) Pulse Rate:  [62-73] 66 (03/17 0822) Cardiac Rhythm: Normal sinus rhythm (03/17 0702) Resp:  [14-25] 19 (03/17 0822) BP: (119-149)/(49-72) 120/49 (03/17 0822) SpO2:  [91 %-98 %] 95 % (03/17 0822) Weight:  [117.9 kg-118.7 kg] 117.9 kg (03/17 0318)  Hemodynamic parameters for last 24 hours:    Intake/Output from previous day: 03/16 0701 - 03/17 0700 In: 1320 [P.O.:1320] Out: 1325 [Urine:1325] Intake/Output this shift: Total I/O In: 120 [P.O.:120] Out: -   General appearance: alert and cooperative Neurologic: intact Heart: regular rate and rhythm, S1, S2 normal, no murmur, click, rub or gallop Lungs: diminished breath sounds bibasilar Extremities: edema mild Wound: incisions ok  Lab Results: Recent Labs    10/10/19 0151 10/12/19 0357  WBC 10.4 5.3  HGB 11.6* 10.4*  HCT 39.2 35.3*  PLT 193 233   BMET:  Recent Labs    10/11/19 0254 10/12/19 0357  NA 135 136  K 4.1 3.6  CL 95* 92*  CO2 29 31  GLUCOSE 145* 107*  BUN 29* 30*  CREATININE 1.81* 1.62*  CALCIUM 8.4* 8.4*    PT/INR: No results for input(s): LABPROT, INR in the last 72 hours. ABG    Component Value Date/Time   PHART 7.362 10/08/2019 1023   HCO3 22.1 10/08/2019 1023   TCO2 23 10/08/2019 1023   ACIDBASEDEF 3.0 (H) 10/08/2019 1023   O2SAT 93.0 10/08/2019 1023   CBG (last 3)  Recent Labs    10/11/19 1551 10/11/19 2128 10/12/19 0628  GLUCAP 141* 136* 117*    Assessment/Plan: S/P Procedure(s)  (LRB): CORONARY ARTERY BYPASS GRAFTING (CABG) using LIMA to LAD; Endoscopic harvesting of left greater saphenous vein: SVG to RAMUS; SVG to OM1; SVG to PDA. (N/A) TRANSESOPHAGEAL ECHOCARDIOGRAM (TEE) (N/A) ENDARTERECTOMY CAROTID (Right) Endovein Harvest Of Greater Saphenous Vein (Left)  POD 5  Hemodynamically stable in sinus rhythm.  Still on oxygen. He has baseline hypercarbia and hypoxemia on preop RA ABG. He should have assessment for home oxygen. Will eventually need sleep study and evaluation for CPAP.  Volume excess: Wt. Is still 7 lbs over preop. Continue diuresis. Replace K+  DM: glucose under good control. Have not restarted oral agents yet due to elevated creat but can resume at discharge.  Postop atrial fib converted on amio.   Continue IS, ambulation. Plan home Friday. May need oxygen at home.   LOS: 5 days    Gaye Pollack 10/12/2019

## 2019-10-13 LAB — GLUCOSE, CAPILLARY
Glucose-Capillary: 136 mg/dL — ABNORMAL HIGH (ref 70–99)
Glucose-Capillary: 172 mg/dL — ABNORMAL HIGH (ref 70–99)
Glucose-Capillary: 104 mg/dL — ABNORMAL HIGH (ref 70–99)
Glucose-Capillary: 148 mg/dL — ABNORMAL HIGH (ref 70–99)

## 2019-10-13 MED ORDER — AMLODIPINE BESYLATE 5 MG PO TABS
5.0000 mg | ORAL_TABLET | Freq: Every day | ORAL | Status: DC
  Administered 2019-10-13 – 2019-10-14 (×2): 5 mg via ORAL
  Filled 2019-10-13 (×2): qty 1

## 2019-10-13 MED FILL — Heparin Sodium (Porcine) Inj 1000 Unit/ML: INTRAMUSCULAR | Qty: 10 | Status: AC

## 2019-10-13 MED FILL — Mannitol IV Soln 20%: INTRAVENOUS | Qty: 500 | Status: AC

## 2019-10-13 MED FILL — Lidocaine HCl Local Soln Prefilled Syringe 100 MG/5ML (2%): INTRAMUSCULAR | Qty: 5 | Status: AC

## 2019-10-13 MED FILL — Sodium Bicarbonate IV Soln 8.4%: INTRAVENOUS | Qty: 50 | Status: AC

## 2019-10-13 MED FILL — Electrolyte-R (PH 7.4) Solution: INTRAVENOUS | Qty: 4000 | Status: AC

## 2019-10-13 NOTE — Progress Notes (Addendum)
[address]       [place] [address]             [phone]      6 Days Post-Op Procedure(s) (LRB): CORONARY ARTERY BYPASS GRAFTING (CABG) using LIMA to LAD; Endoscopic harvesting of left greater saphenous vein: SVG to RAMUS; SVG to OM1; SVG to PDA. (N/A) TRANSESOPHAGEAL ECHOCARDIOGRAM (TEE) (N/A) ENDARTERECTOMY CAROTID (Right) Endovein Harvest Of Greater Saphenous Vein (Left) Subjective: Had some afib with CVR, in sinus currently  Objective: Vital signs in last 24 hours: Temp:  [97.6 F (36.4 C)-98.5 F (36.9 C)] 98.5 F (36.9 C) (03/18 0427) Pulse Rate:  [60-73] 64 (03/18 0427) Cardiac Rhythm: Normal sinus rhythm (03/17 1900) Resp:  [19-24] 22 (03/18 0427) BP: (120-144)/(49-70) 131/61 (03/18 0427) SpO2:  [95 %-96 %] 96 % (03/18 0427) Weight:  [673 kg] 116 kg (03/18 0427)  Hemodynamic parameters for last 24 hours:    Intake/Output from previous day: 03/17 0701 - 03/18 0700 In: 440 [P.O.:440] Out: 600 [Urine:600] Intake/Output this shift: No intake/output data recorded.  General appearance: alert, cooperative and no distress Heart: regular rate and rhythm Lungs: dim in bases, some ronchi- improves with cough Abdomen: soft, non-tender or distended Extremities: + pitting edema Wound: incis healing well  Lab Results: Recent Labs    10/12/19 0357  WBC 5.3  HGB 10.4*  HCT 35.3*  PLT 233   BMET:  Recent Labs    10/11/19 0254 10/12/19 0357  NA 135 136  K 4.1 3.6  CL 95* 92*  CO2 29 31  GLUCOSE 145* 107*  BUN 29* 30*  CREATININE 1.81* 1.62*  CALCIUM 8.4* 8.4*    PT/INR: No results for input(s): LABPROT, INR in the last 72 hours. ABG    Component Value Date/Time   PHART 7.362 10/08/2019 1023   HCO3 22.1 10/08/2019 1023   TCO2 23 10/08/2019 1023   ACIDBASEDEF 3.0 (H) 10/08/2019 1023   O2SAT 93.0 10/08/2019 1023   CBG (last 3)  Recent Labs    10/12/19 1620 10/12/19 2131 10/13/19 0639  GLUCAP 148* 144* 136*     Meds Scheduled Meds: . amiodarone  400 mg Oral BID  . aspirin EC  325 mg Oral Daily  . atorvastatin  80 mg Oral Daily  . colchicine  0.3 mg Oral BID  . docusate sodium  200 mg Oral Daily  . furosemide  40 mg Oral Daily  . insulin aspart  0-24 Units Subcutaneous TID AC & HS  . insulin detemir  20 Units Subcutaneous BID  . mouth rinse  15 mL Mouth Rinse BID  . metolazone  2.5 mg Oral Daily  . metoprolol tartrate  25 mg Oral BID  . nicotine  7 mg Transdermal Daily  . pantoprazole  40 mg Oral QAC breakfast  . potassium chloride  20 mEq Oral TID  . sodium chloride flush  3 mL Intravenous Q12H   Continuous Infusions: . sodium chloride Stopped (10/12/19 0318)   PRN Meds:.sodium chloride, acetaminophen, bisacodyl **OR** bisacodyl, levalbuterol, ondansetron **OR** ondansetron (ZOFRAN) IV, oxyCODONE, sodium chloride flush, traMADol  Xrays No results found.  Assessment/Plan: S/P Procedure(s) (LRB): CORONARY ARTERY BYPASS GRAFTING (CABG) using LIMA to LAD; Endoscopic harvesting of left greater saphenous vein: SVG to RAMUS; SVG to OM1; SVG to PDA. (N/A) TRANSESOPHAGEAL ECHOCARDIOGRAM (TEE) (N/A) ENDARTERECTOMY CAROTID (Right) Endovein Harvest Of Greater Saphenous Vein (Left)  1 conts to progress well 2 had some afib intermittently- current in sinus, om amio and  lopressor Poss get epw's out today 3 some htn, hold on ACE-ARB with renal insuff for now, add low dose norvasc 4 sats ok on 3 liter- arrange home O2, cont pulm toilet, cleat sputum currently, no fevers 5 cont to diuresie, recheck labs in am 6 BS adeq control - has been seen by DM coordinator- home meds at D/C, close f/u with primary    LOS: 6 days    [name] [facility] 10/13/2019 Pager [phone]   Chart reviewed, patient examined, agree with above. Had two brief episodes of atrial fib but mostly sinus. Continue amio and Lopressor.  He continues to diurese well. Wt down 4-5 lbs yesterday. Now about 3 lbs over preop  wt. Still has some lower leg edema. Continue diuretic. He feels like he will be ready to go home tomorrow. Walking well and feels strong. Will see in am and make decision.

## 2019-10-13 NOTE — Progress Notes (Signed)
CARDIAC REHAB PHASE I   PRE:  Rate/Rhythm: 60 SR  BP:  Sitting: 133/61      SaO2: 98 3L --> 85 RA --> 92 2L  MODE:  Ambulation: 400 ft   POST:  Rate/Rhythm: 78 SR  BP:  Sitting: 142/67    SaO2: 93 2L  Pt ambulated 423ft in hallway standby assist with front wheel walker. Pt denies pain or SOB. Pt helped back to bed. Encouraged continued ambulation and IS use. Pt currently demonstrating 017 on IS. Will continue to follow.  [id number] Rufina Falco, RN BSN 10/13/2019 10:42 AM

## 2019-10-13 NOTE — Progress Notes (Signed)
SATURATION QUALIFICATIONS: (This note is used to comply with regulatory documentation for home oxygen)  Patient Saturations on Room Air at Rest = 84%  Patient Saturations on Room Air while Ambulating = not done, pt desats on room air  Patient Saturations on 2 Liters of oxygen while Ambulating = 93%  Please briefly explain why patient needs home oxygen: yes, pt needs oxygen as he desats on room air.

## 2019-10-13 NOTE — Progress Notes (Signed)
Removed epicardial wires per order. 4 intact.  Pt tolerated procedure well.  Pt instructed to remain on bedrest for one hour.  Frequent vitals will be taken and documented. Pt resting with call bell within reach. ° °

## 2019-10-13 NOTE — Care Management Important Message (Signed)
Important Message  Patient Details  Name: Joshua Roy MRN: [id number] Date of Birth: Mar 11, 1951   Medicare Important Message Given:  Yes     Shelda Altes 10/13/2019, 12:31 PM

## 2019-10-14 LAB — BASIC METABOLIC PANEL
Anion gap: 11 (ref 5–15)
BUN: 21 mg/dL (ref 8–23)
CO2: 35 mmol/L — ABNORMAL HIGH (ref 22–32)
Calcium: 8.6 mg/dL — ABNORMAL LOW (ref 8.9–10.3)
Chloride: 91 mmol/L — ABNORMAL LOW (ref 98–111)
Creatinine, Ser: 1.26 mg/dL — ABNORMAL HIGH (ref 0.61–1.24)
GFR calc Af Amer: >60 mL/min (ref >60)
GFR calc non Af Amer: 57 mL/min — ABNORMAL LOW (ref >60)
Glucose, Bld: 97 mg/dL (ref 70–99)
Potassium: 3.3 mmol/L — ABNORMAL LOW (ref 3.5–5.1)
Sodium: 137 mmol/L (ref 135–145)

## 2019-10-14 LAB — GLUCOSE, CAPILLARY: Glucose-Capillary: 84 mg/dL (ref 70–99)

## 2019-10-14 LAB — CBC
HCT: 34.9 % — ABNORMAL LOW (ref 39.0–52.0)
Hemoglobin: 10.5 g/dL — ABNORMAL LOW (ref 13.0–17.0)
MCH: 23 pg — ABNORMAL LOW (ref 26.0–34.0)
MCHC: 30.1 g/dL (ref 30.0–36.0)
MCV: 76.4 fL — ABNORMAL LOW (ref 80.0–100.0)
Platelets: 301 10*3/uL (ref 150–400)
RBC: 4.57 MIL/uL (ref 4.22–5.81)
RDW: 20.3 % — ABNORMAL HIGH (ref 11.5–15.5)
WBC: 6.3 10*3/uL (ref 4.0–10.5)
nRBC: 0 % (ref 0.0–0.2)

## 2019-10-14 MED ORDER — ASPIRIN EC 81 MG PO TBEC: 81.0000 mg | DELAYED_RELEASE_TABLET | Freq: Every day | ORAL | Status: DC

## 2019-10-14 MED ORDER — AMIODARONE HCL 400 MG PO TABS: 400.0000 mg | ORAL_TABLET | Freq: Two times a day (BID) | ORAL | 1 refills | Status: DC

## 2019-10-14 MED ORDER — AMLODIPINE BESYLATE 5 MG PO TABS: 5.0000 mg | ORAL_TABLET | Freq: Every day | ORAL | 1 refills | Status: DC

## 2019-10-14 MED ORDER — TRAMADOL HCL 50 MG PO TABS: 50.0000 mg | ORAL_TABLET | Freq: Four times a day (QID) | ORAL | 0 refills | Status: AC | PRN

## 2019-10-14 MED ORDER — POTASSIUM CHLORIDE ER 20 MEQ PO TBCR: 20.0000 meq | EXTENDED_RELEASE_TABLET | Freq: Every day | ORAL | 1 refills | Status: DC

## 2019-10-14 MED ORDER — FUROSEMIDE 40 MG PO TABS: 40.0000 mg | ORAL_TABLET | Freq: Every day | ORAL | 1 refills | Status: DC

## 2019-10-14 MED ORDER — POTASSIUM CHLORIDE CRYS ER 20 MEQ PO TBCR
40.0000 meq | EXTENDED_RELEASE_TABLET | Freq: Once | ORAL | Status: AC
  Administered 2019-10-14: 40 meq via ORAL
  Filled 2019-10-14: qty 2

## 2019-10-14 NOTE — Progress Notes (Addendum)
[address]       [place] [address]             [phone]      7 Days Post-Op Procedure(s) (LRB): CORONARY ARTERY BYPASS GRAFTING (CABG) using LIMA to LAD; Endoscopic harvesting of left greater saphenous vein: SVG to RAMUS; SVG to OM1; SVG to PDA. (N/A) TRANSESOPHAGEAL ECHOCARDIOGRAM (TEE) (N/A) ENDARTERECTOMY CAROTID (Right) Endovein Harvest Of Greater Saphenous Vein (Left) Subjective: Feels pretty well, cough persists, clear sputum  Objective: Vital signs in last 24 hours: Temp:  [97.6 F (36.4 C)-98.5 F (36.9 C)] 98.3 F (36.8 C) (03/19 0518) Pulse Rate:  [63-66] 66 (03/19 0518) Cardiac Rhythm: Normal sinus rhythm (03/18 1900) Resp:  [20-23] 23 (03/19 0518) BP: (107-147)/(56-80) 147/68 (03/19 0518) SpO2:  [90 %-98 %] 96 % (03/19 0518) Weight:  [114.8 kg] 114.8 kg (03/19 0518)  Hemodynamic parameters for last 24 hours:    Intake/Output from previous day: 03/18 0701 - 03/19 0700 In: 400 [P.O.:400] Out: 925 [Urine:925] Intake/Output this shift: No intake/output data recorded.  General appearance: alert, cooperative and no distress Heart: regular rate and rhythm Lungs: coarse BS Abdomen: benign Extremities: + edema Wound: incis healing well  Lab Results: Recent Labs    10/12/19 0357 10/14/19 0251  WBC 5.3 6.3  HGB 10.4* 10.5*  HCT 35.3* 34.9*  PLT 233 301   BMET:  Recent Labs    10/12/19 0357 10/14/19 0251  NA 136 137  K 3.6 3.3*  CL 92* 91*  CO2 31 35*  GLUCOSE 107* 97  BUN 30* 21  CREATININE 1.62* 1.26*  CALCIUM 8.4* 8.6*    PT/INR: No results for input(s): LABPROT, INR in the last 72 hours. ABG    Component Value Date/Time   PHART 7.362 10/08/2019 1023   HCO3 22.1 10/08/2019 1023   TCO2 23 10/08/2019 1023   ACIDBASEDEF 3.0 (H) 10/08/2019 1023   O2SAT 93.0 10/08/2019 1023   CBG (last 3)  Recent Labs    10/13/19 1817 10/13/19 2213 10/14/19 0642  GLUCAP 104* 148* 84    Meds Scheduled Meds: .  amiodarone  400 mg Oral BID  . amLODipine  5 mg Oral Daily  . aspirin EC  325 mg Oral Daily  . atorvastatin  80 mg Oral Daily  . colchicine  0.3 mg Oral BID  . docusate sodium  200 mg Oral Daily  . furosemide  40 mg Oral Daily  . insulin aspart  0-24 Units Subcutaneous TID AC & HS  . insulin detemir  20 Units Subcutaneous BID  . mouth rinse  15 mL Mouth Rinse BID  . metoprolol tartrate  25 mg Oral BID  . nicotine  7 mg Transdermal Daily  . pantoprazole  40 mg Oral QAC breakfast  . sodium chloride flush  3 mL Intravenous Q12H   Continuous Infusions: . sodium chloride Stopped (10/12/19 0318)   PRN Meds:.sodium chloride, acetaminophen, bisacodyl **OR** bisacodyl, levalbuterol, ondansetron **OR** ondansetron (ZOFRAN) IV, oxyCODONE, sodium chloride flush, traMADol  Xrays No results found.  Assessment/Plan: S/P Procedure(s) (LRB): CORONARY ARTERY BYPASS GRAFTING (CABG) using LIMA to LAD; Endoscopic harvesting of left greater saphenous vein: SVG to RAMUS; SVG to OM1; SVG to PDA. (N/A) TRANSESOPHAGEAL ECHOCARDIOGRAM (TEE) (N/A) ENDARTERECTOMY CAROTID (Right) Endovein Harvest Of Greater Saphenous Vein (Left)   1 doing well  2 hemodyn stable, maintaining SR 3 O2 arranged for home 4 creat improved, replace K+, cont daily lasix 5 BS ok- home meds at d/c  6 stable for d/c   LOS: 7 days    [name] PA-C Pager [phone] 10/14/2019   Chart reviewed, patient examined, agree with above. He feels well. Rhythm stable in sinus. Plan home today.

## 2019-10-14 NOTE — Progress Notes (Signed)
CARDIAC REHAB PHASE I   D/c education completed with pt and wife. Pt educated on importance of site care and monitoring incision daily. Encouraged continued IS use, walks, and sternal precautions. Pt given in-the-tube sheet along with heart healthy and diabetic diets. Stressed importance of safety, especially in the setting of oxygen tubing. Spoke with pt about smoking cessation. Reviewed restrictions and exercise guidelines. Will refer to CRP II [place]. Pt is interested in participating in Virtual Cardiac and Pulmonary Rehab. Pt advised that Virtual Cardiac and Pulmonary Rehab is provided at no cost to the patient.  Checklist:  1. Pt has smart device  ie smartphone and/or ipad for downloading an app  Yes 2. Reliable internet/wifi service    Yes 3. Understands how to use their smartphone and navigate within an app.  Yes  Pt verbalized understanding and is in agreement.  [id number] Rufina Falco, RN BSN 10/14/2019 11:34 AM

## 2019-10-14 NOTE — Progress Notes (Signed)
Patient ID: Joshua Roy, male   DOB: May 31, 1951, 69 y.o.   MRN: [id number]  Progress Note    10/14/2019 8:17 AM 7 Days Post-Op  Subjective: Comfortable.  Sitting up in chair   Vitals:   10/14/19 0030 10/14/19 0518  BP: 129/62 (!) 147/68  Pulse: 63 66  Resp: (!) 21 (!) 23  Temp: 97.6 F (36.4 C) 98.3 F (36.8 C)  SpO2: 92% 96%   Physical Exam: Right neck incision healing without difficulty  CBC    Component Value Date/Time   WBC 6.3 10/14/2019 0251   RBC 4.57 10/14/2019 0251   HGB 10.5 (L) 10/14/2019 0251   HGB 15.3 08/31/2019 1511   HCT 34.9 (L) 10/14/2019 0251   HCT 49.0 08/31/2019 1511   PLT 301 10/14/2019 0251   PLT 360 08/31/2019 1511   MCV 76.4 (L) 10/14/2019 0251   MCV 74 (L) 08/31/2019 1511   MCV 83 05/14/2013 0252   MCH 23.0 (L) 10/14/2019 0251   MCHC 30.1 10/14/2019 0251   RDW 20.3 (H) 10/14/2019 0251   RDW 16.8 (H) 08/31/2019 1511   RDW 14.6 (H) 05/14/2013 0252   LYMPHSABS 2.4 08/31/2019 1511   LYMPHSABS 2.7 05/14/2013 0252   MONOABS 0.4 05/14/2013 0252   EOSABS 0.0 08/31/2019 1511   EOSABS 0.1 05/14/2013 0252   BASOSABS 0.1 08/31/2019 1511   BASOSABS 0.1 05/14/2013 0252    BMET    Component Value Date/Time   NA 137 10/14/2019 0251   NA 137 08/31/2019 1511   NA 140 05/14/2013 0252   K 3.3 (L) 10/14/2019 0251   K 3.9 05/14/2013 0252   CL 91 (L) 10/14/2019 0251   CL 105 05/14/2013 0252   CO2 35 (H) 10/14/2019 0251   CO2 29 05/14/2013 0252   GLUCOSE 97 10/14/2019 0251   GLUCOSE 115 (H) 05/14/2013 0252   BUN 21 10/14/2019 0251   BUN 32 (H) 08/31/2019 1511   BUN 34 (H) 05/14/2013 0252   CREATININE 1.26 (H) 10/14/2019 0251   CREATININE 1.17 09/26/2013 0944   CALCIUM 8.6 (L) 10/14/2019 0251   CALCIUM 8.6 05/14/2013 0252   GFRNONAA 57 (L) 10/14/2019 0251   GFRNONAA >60 05/14/2013 0252   GFRAA >60 10/14/2019 0251   GFRAA >60 05/14/2013 0252    INR    Component Value Date/Time   INR 1.4 (H) 10/07/2019 1619   INR 1.0  05/14/2013 0252     Intake/Output Summary (Last 24 hours) at 10/14/2019 0817 Last data filed at 10/14/2019 0644 Gross per 24 hour  Intake 400 ml  Output 925 ml  Net -525 ml     Assessment/Plan:  69 y.o. male stable.  Plan for discharge today.  I will see him back in the office in 3 to 4 weeks for wound check.  We will then arrange carotid duplex in 9 months     Rosetta Posner, MD [facility] [phone] 10/14/2019 8:17 AM

## 2019-10-14 NOTE — Progress Notes (Signed)
10/14/2019 12:27 PM Discharge AVS meds taken today and those due this evening reviewed.  Follow-up appointments and when to call md reviewed.  D/C IV and TELE.  Questions and concerns addressed.   D/C home per orders. [place]

## 2019-10-14 NOTE — TOC Transition Note (Signed)
Transition of Care [facility]) - CM/SW Discharge Note   Patient Details  Name: LYSANDER CALIXTE MRN: [id number] Date of Birth: 1950-09-13  [facility]) CM/SW Contact:  Zenon Mayo, RN Phone Number: 10/14/2019, 10:10 AM   Clinical Narrative:    Patient for possible dc today, will need home oxygen.  NCM spoke with patient , informed him he needs home oxygen he does not have a preference of the supplier,  he states  [employer] is ok  to supply his home oxygen, referral given to [facility]. He will bring tanks up to patient room and facilitate home concentrator set up.  Patient has transport home.   Final next level of care: Home/Self Care Barriers to Discharge: No Barriers Identified   Patient Goals and CMS Choice        Discharge Placement                       Discharge Plan and Services                DME Arranged: Oxygen DME Agency: [employer] Date DME Agency Contacted: 10/14/19 Time DME Agency Contacted: 1010 Representative spoke with at DME Agency: [place]: NA          Social Determinants of Health ([place]) Interventions     Readmission Risk Interventions No flowsheet data found.

## 2019-10-15 DIAGNOSIS — I252 Old myocardial infarction: Secondary | ICD-10-CM | POA: Diagnosis not present

## 2019-10-15 DIAGNOSIS — Z7902 Long term (current) use of antithrombotics/antiplatelets: Secondary | ICD-10-CM | POA: Diagnosis not present

## 2019-10-15 DIAGNOSIS — E1169 Type 2 diabetes mellitus with other specified complication: Secondary | ICD-10-CM | POA: Diagnosis not present

## 2019-10-15 DIAGNOSIS — J449 Chronic obstructive pulmonary disease, unspecified: Secondary | ICD-10-CM | POA: Diagnosis not present

## 2019-10-15 DIAGNOSIS — Z9981 Dependence on supplemental oxygen: Secondary | ICD-10-CM | POA: Diagnosis not present

## 2019-10-15 DIAGNOSIS — Z7982 Long term (current) use of aspirin: Secondary | ICD-10-CM | POA: Diagnosis not present

## 2019-10-15 DIAGNOSIS — E785 Hyperlipidemia, unspecified: Secondary | ICD-10-CM | POA: Diagnosis not present

## 2019-10-15 DIAGNOSIS — Z951 Presence of aortocoronary bypass graft: Secondary | ICD-10-CM | POA: Diagnosis not present

## 2019-10-15 DIAGNOSIS — I25111 Atherosclerotic heart disease of native coronary artery with angina pectoris with documented spasm: Secondary | ICD-10-CM | POA: Diagnosis not present

## 2019-10-15 DIAGNOSIS — I6521 Occlusion and stenosis of right carotid artery: Secondary | ICD-10-CM | POA: Diagnosis not present

## 2019-10-15 DIAGNOSIS — I5032 Chronic diastolic (congestive) heart failure: Secondary | ICD-10-CM | POA: Diagnosis not present

## 2019-10-15 DIAGNOSIS — I10 Essential (primary) hypertension: Secondary | ICD-10-CM | POA: Diagnosis not present

## 2019-10-17 ENCOUNTER — Ambulatory Visit (HOSPITAL_COMMUNITY)
Admit: 2019-10-17 | Discharge: 2019-10-17 | Disposition: A | Discharge status: Home / Self Care | Payer: Medicare Other | Source: Ambulatory Visit | Attending: Internal Medicine | Admitting: Internal Medicine

## 2019-10-17 ENCOUNTER — Ambulatory Visit (INDEPENDENT_AMBULATORY_CARE_PROVIDER_SITE_OTHER): Payer: Self-pay

## 2019-10-17 ENCOUNTER — Encounter (HOSPITAL_COMMUNITY): Payer: Self-pay | Admitting: Internal Medicine

## 2019-10-17 ENCOUNTER — Other Ambulatory Visit: Payer: Self-pay

## 2019-10-17 VITALS — BP 138/78 | HR 75 | Wt 251.1 lb

## 2019-10-17 DIAGNOSIS — Z87891 Personal history of nicotine dependence: Secondary | ICD-10-CM | POA: Insufficient documentation

## 2019-10-17 DIAGNOSIS — I110 Hypertensive heart disease with heart failure: Secondary | ICD-10-CM | POA: Insufficient documentation

## 2019-10-17 DIAGNOSIS — E119 Type 2 diabetes mellitus without complications: Secondary | ICD-10-CM | POA: Diagnosis not present

## 2019-10-17 DIAGNOSIS — Z4802 Encounter for removal of sutures: Secondary | ICD-10-CM

## 2019-10-17 DIAGNOSIS — J449 Chronic obstructive pulmonary disease, unspecified: Secondary | ICD-10-CM | POA: Insufficient documentation

## 2019-10-17 DIAGNOSIS — I6529 Occlusion and stenosis of unspecified carotid artery: Secondary | ICD-10-CM | POA: Diagnosis not present

## 2019-10-17 DIAGNOSIS — Z8249 Family history of ischemic heart disease and other diseases of the circulatory system: Secondary | ICD-10-CM | POA: Diagnosis not present

## 2019-10-17 DIAGNOSIS — I10 Essential (primary) hypertension: Secondary | ICD-10-CM | POA: Diagnosis present

## 2019-10-17 DIAGNOSIS — Z888 Allergy status to other drugs, medicaments and biological substances status: Secondary | ICD-10-CM | POA: Diagnosis not present

## 2019-10-17 DIAGNOSIS — I2510 Atherosclerotic heart disease of native coronary artery without angina pectoris: Secondary | ICD-10-CM | POA: Insufficient documentation

## 2019-10-17 DIAGNOSIS — J9611 Chronic respiratory failure with hypoxia: Secondary | ICD-10-CM | POA: Insufficient documentation

## 2019-10-17 DIAGNOSIS — Z7982 Long term (current) use of aspirin: Secondary | ICD-10-CM | POA: Insufficient documentation

## 2019-10-17 DIAGNOSIS — Z79899 Other long term (current) drug therapy: Secondary | ICD-10-CM | POA: Diagnosis not present

## 2019-10-17 DIAGNOSIS — I252 Old myocardial infarction: Secondary | ICD-10-CM | POA: Diagnosis not present

## 2019-10-17 DIAGNOSIS — Z833 Family history of diabetes mellitus: Secondary | ICD-10-CM | POA: Insufficient documentation

## 2019-10-17 DIAGNOSIS — E785 Hyperlipidemia, unspecified: Secondary | ICD-10-CM | POA: Insufficient documentation

## 2019-10-17 DIAGNOSIS — I4891 Unspecified atrial fibrillation: Secondary | ICD-10-CM | POA: Insufficient documentation

## 2019-10-17 DIAGNOSIS — F419 Anxiety disorder, unspecified: Secondary | ICD-10-CM | POA: Insufficient documentation

## 2019-10-17 DIAGNOSIS — Z951 Presence of aortocoronary bypass graft: Secondary | ICD-10-CM | POA: Insufficient documentation

## 2019-10-17 DIAGNOSIS — I5032 Chronic diastolic (congestive) heart failure: Secondary | ICD-10-CM | POA: Insufficient documentation

## 2019-10-17 DIAGNOSIS — Z7984 Long term (current) use of oral hypoglycemic drugs: Secondary | ICD-10-CM | POA: Diagnosis not present

## 2019-10-17 DIAGNOSIS — M179 Osteoarthritis of knee, unspecified: Secondary | ICD-10-CM | POA: Diagnosis not present

## 2019-10-17 DIAGNOSIS — Z800 Family history of malignant neoplasm of digestive organs: Secondary | ICD-10-CM | POA: Diagnosis not present

## 2019-10-17 LAB — BASIC METABOLIC PANEL
Anion gap: 15 (ref 5–15)
BUN: 16 mg/dL (ref 8–23)
CO2: 31 mmol/L (ref 22–32)
Calcium: 9.2 mg/dL (ref 8.9–10.3)
Chloride: 88 mmol/L — ABNORMAL LOW (ref 98–111)
Creatinine, Ser: 1.42 mg/dL — ABNORMAL HIGH (ref 0.61–1.24)
GFR calc Af Amer: 58 mL/min — ABNORMAL LOW (ref >60)
GFR calc non Af Amer: 50 mL/min — ABNORMAL LOW (ref >60)
Glucose, Bld: 98 mg/dL (ref 70–99)
Potassium: 4.2 mmol/L (ref 3.5–5.1)
Sodium: 134 mmol/L — ABNORMAL LOW (ref 135–145)

## 2019-10-17 LAB — BRAIN NATRIURETIC PEPTIDE: B Natriuretic Peptide: 252.8 pg/mL — ABNORMAL HIGH (ref 0.0–100.0)

## 2019-10-17 MED ORDER — CITALOPRAM HYDROBROMIDE 10 MG PO TABS: 10.0000 mg | ORAL_TABLET | Freq: Every day | ORAL | 2 refills | Status: DC

## 2019-10-17 MED ORDER — SPIRONOLACTONE 25 MG PO TABS: 25.0000 mg | ORAL_TABLET | Freq: Every day | ORAL | 3 refills | Status: DC

## 2019-10-17 NOTE — Patient Instructions (Signed)
START Spironolactone 25mg  daily  STOP Potassium  START Celexa 10mg  daily  Please wear compression hose daily.  Routine lab work today. Will notify you of abnormal results  Follow up as needed

## 2019-10-17 NOTE — Progress Notes (Signed)
Removed 3 sutures from chest tube incision sites with no signs of infection and patient tolerated well.  

## 2019-10-17 NOTE — Progress Notes (Signed)
ADVANCED HF CLINIC CONSULT NOTE  Referring Physician: Dr. Cyndia Bent Primary Care: Primary Cardiologist: Dr. Rockey Situ  HPI:  Mr. Gratz s a 69 year old male with DM2, HTN, HL, COPD with ongoing tobacco use and CAD s/p remote MI and CABG (LIMA - LAD, SVG - Ramus, SVG - OM, SVG - PDA) and R CEA on 10/07/19.   Had apparent syncopal episode in 1/21 followed by 25-30 pound weight gain due to HF. Seen in Cardiology Clinic at [facility]. Echo obtained showed EF 60-65% with grade I DD. Got weight down to 246.   He underwent cardiac catheterization on 09/08/2019 which showed a 70% mid to distal left main stenosis. The proximal to mid LAD had 70% stenosis. The left circumflex had an 80% mid vessel stenosis. The proximal RCA had 95% stenosis and was occluded after an acute marginal branch. The posterior descending branch filled by collaterals. Right heart catheterization showed moderate pulmonary hypertension with PA pressure of 67/23 and a mean of 42.    LVEDP was only 10. Cardiac index was 2.13.  The patient was admitted electively for coronary artery bypass graft surgery(Dr Bartle) and right carotid endarterectomy by Curt Jews, MD which was performed 10/07/19. Postoperative hospital course was relatively uncomplicated except for HTN, mild AKI and need for home O2. Had brief AF.  D/c weight was 253   Referred to HF Clinic for post-hospital f/u. Overall feeling pretty good. Main issue is that he is having a hard time sleeping. Feels anxious. Trying to quit smoking. PCP prescribes Xanax. Still having some LE edema. Still wears O2 at home. No neuro sx. No angina. Chest feels pretty good. Has not needed pain meds.      Review of Systems: [y] = yes, [ ]  = no   General: Weight gain Blue.Reese ]; Weight loss [ ] ; Anorexia [ ] ; Fatigue [ y]; Fever [ ] ; Chills [ ] ; Weakness Blue.Reese ]  Cardiac: Chest pain/pressure [ ] ; Resting SOB [ ] ; Exertional SOB Blue.Reese ]; Orthopnea [ ] ; Pedal Edema Blue.Reese ]; Palpitations [ ] ; Syncope [ ] ; Presyncope  [ ] ; Paroxysmal nocturnal dyspnea[ ]   Pulmonary: Cough [ y]; Wheezing[ ] ; Hemoptysis[ ] ; Sputum Blue.Reese ]; Snoring [ ]   GI: Vomiting[ ] ; Dysphagia[ ] ; Melena[ ] ; Hematochezia [ ] ; Heartburn[ ] ; Abdominal pain [ ] ; Constipation [ ] ; Diarrhea [ ] ; BRBPR [ ]   GU: Hematuria[ ] ; Dysuria [ ] ; Nocturia[ ]   Vascular: Pain in legs with walking [ ] ; Pain in feet with lying flat [ ] ; Non-healing sores [ ] ; Stroke [ ] ; TIA [ ] ; Slurred speech [ ] ;  Neuro: Headaches[ ] ; Vertigo[ ] ; Seizures[ ] ; Paresthesias[ ] ;Blurred vision [ ] ; Diplopia [ ] ; Vision changes [ ]   Ortho/Skin: Arthritis Blue.Reese ]; Joint pain Blue.Reese ]; Muscle pain [ ] ; Joint swelling [ ] ; Back Pain [ ] ; Rash [ ]   Psych: Depression[ ] ; Anxiety[y ]  Heme: Bleeding problems [ ] ; Clotting disorders [ ] ; Anemia [ ]   Endocrine: Diabetes [ y]; Thyroid dysfunction[ ]    Past Medical History:  Diagnosis Date  . (HFpEF) heart failure with preserved ejection fraction ([place])    a. 07/2019 Echo: EF 60-65%. Mod LVH. Gr1 DD. No rwma. Nl RV size/fxn. Mildly dil LA.  Marland Kitchen ACE-inhibitor cough   . Anxiety   . Bronchitis 06/27/2016  . COPD (chronic obstructive pulmonary disease) (HCC)    cath, mild inf. hypokinesis EF 49%, 100% ROA  . Coronary artery disease 2001   a. 2001 Cath: LM 10, LAD 60p/m, D1  30, LCX 20p, RCA 173m/d; b. 05/2017 Ex Mv: Ex time 5:46, antlat twi @ rest, fixed infarct w/ mild peri-infarct ischemia. EF 38%.  . Diabetes mellitus type II   . Hyperlipidemia   . Hypertension   . MI (myocardial infarction) ([place]) age 36  . OA (osteoarthritis)    knee OA, injected 2012 by ortho  . Pneumonia     Current Outpatient Medications  Medication Sig Dispense Refill  . acetaminophen (TYLENOL) 500 MG tablet Take 1,000 mg by mouth every 8 (eight) hours as needed for mild pain or headache.    . ALPRAZolam (XANAX) 0.25 MG tablet Take 0.25 mg by mouth 2 (two) times daily as needed for anxiety.     Marland Kitchen amiodarone (PACERONE) 400 MG tablet Take 1 tablet (400 mg total) by mouth  2 (two) times daily. For 7 days then once daily 70 tablet 1  . amLODipine (NORVASC) 5 MG tablet Take 1 tablet (5 mg total) by mouth daily. 30 tablet 1  . aspirin EC 81 MG tablet Take 1 tablet (81 mg total) by mouth daily.    Marland Kitchen atorvastatin (LIPITOR) 80 MG tablet Take 1 tablet (80 mg total) by mouth daily. 90 tablet 3  . clopidogrel (PLAVIX) 75 MG tablet Take 75 mg by mouth daily.    . fenofibrate 160 MG tablet Take 1 tablet (160 mg total) by mouth daily. 90 tablet 1  . furosemide (LASIX) 40 MG tablet Take 1 tablet (40 mg total) by mouth daily. 30 tablet 1  . glimepiride (AMARYL) 2 MG tablet Take one half tablet (1 mg) by mouth each morning (Patient taking differently: Take 2 mg by mouth daily with breakfast. ) 45 tablet 1  . glucose blood (ACCU-CHEK AVIVA PLUS) test strip Use to check blood sugar once daily or as needed.  Diagnosis:  E11.9   Non insulin-dependent. 100 each 3  . Lancets (ACCU-CHEK MULTICLIX) lancets Use as instructed to check blood sugar once daily or as needed.  Diagnosis:  E11.9   Non insulin-dependent. 102 each 3  . metFORMIN (GLUCOPHAGE) 1000 MG tablet Take 1 tablet (1,000 mg total) by mouth 2 (two) times daily. 180 tablet 1  . metoprolol tartrate (LOPRESSOR) 25 MG tablet Take 1 tablet (25 mg total) by mouth 2 (two) times daily. 180 tablet 1  . nicotine (NICODERM CQ - DOSED IN MG/24 HOURS) 21 mg/24hr patch Place 21 mg onto the skin daily as needed (nicotine dependence).     . pantoprazole (PROTONIX) 40 MG tablet Take 40 mg by mouth daily before breakfast.    . potassium chloride 20 MEQ TBCR Take 20 mEq by mouth daily. 30 tablet 1  . traMADol (ULTRAM) 50 MG tablet Take 1 tablet (50 mg total) by mouth every 6 (six) hours as needed for up to 7 days for moderate pain. 28 tablet 0   No current facility-administered medications for this encounter.    Allergies  Allergen Reactions  . Ramipril Cough  . Mucinex D [Pseudoephedrine-Guaifenesin Er] Other (See Comments) and Hypertension     Elevated BP, insomnia      Social History   Socioeconomic History  . Marital status: Married    Spouse name: Not on file  . Number of children: 3  . Years of education: Not on file  . Highest education level: Not on file  Occupational History  . Occupation: Engineer, manufacturing, Estate agent  Tobacco Use  . Smoking status: Former Smoker    Packs/day: 1.00  Years: 40.00    Pack years: 40.00    Quit date: 10/07/2019    Years since quitting: 0.0  . Smokeless tobacco: Never Used  . Tobacco comment: per patient wears a patch and has cut down on cigarettes/day (10/05/2019)  Substance and Sexual Activity  . Alcohol use: Yes    Alcohol/week: 0.0 standard drinks    Comment: 5-6 cocktails, 1 times a week  . Drug use: No  . Sexual activity: Not on file  Other Topics Concern  . Not on file  Social History Narrative   From [place].  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   Social Determinants of Health   Financial Resource Strain:   . Difficulty of Paying Living Expenses:   Food Insecurity:   . Worried About Charity fundraiser in the Last Year:   . Arboriculturist in the Last Year:   Transportation Needs:   . Film/video editor (Medical):   Marland Kitchen Lack of Transportation (Non-Medical):   Physical Activity:   . Days of Exercise per Week:   . Minutes of Exercise per Session:   Stress:   . Feeling of Stress :   Social Connections:   . Frequency of Communication with Friends and Family:   . Frequency of Social Gatherings with Friends and Family:   . Attends Religious Services:   . Active Member of Clubs or Organizations:   . Attends Archivist Meetings:   Marland Kitchen Marital Status:   Intimate Partner Violence:   . Fear of Current or Ex-Partner:   . Emotionally Abused:   Marland Kitchen Physically Abused:   . Sexually Abused:       Family History  Problem Relation Age of Onset  . Heart disease Father        CAD, MI x 3  . Hypertension Father   . Alcohol abuse Father   .  Diabetes Father   . Diabetes Sister   . Cancer Brother        lymphoma, in remission  . Colon cancer Brother   . Heart disease Sister        CABG x 3  . Prostate cancer Neg Hx     Vitals:   10/17/19 1108  BP: 138/78  Pulse: 75  SpO2: 93%  Weight: 113.9 kg    PHYSICAL EXAM: General:  Well appearing. No respiratory difficulty HEENT: normal Neck: supple. R CEA scar, hard to see JVP Carotids 2+ bilat; no bruits. No lymphadenopathy or thryomegaly appreciated. Cor: PMI nondisplaced. sternal wound ok  Regular rate & rhythm. No rubs, gallops or murmurs. Lungs: coarse  Abdomen: obese soft, nontender, nondistended. No hepatosplenomegaly. No bruits or masses. Good bowel sounds. Extremities: no cyanosis, clubbing, rash, 2-3+ L>R edema Neuro: alert & oriented x 3, cranial nerves grossly intact. moves all 4 extremities w/o difficulty. Affect pleasant.  ECG: NSR 77 non-specific TWI Personally reviewed  ASSESSMENT & PLAN:  1. CAD  - s/p 4v CABG 10/07/19 (LIMA-LAD, SVG-OM, SVG-Ramus, SVG-RCA) - continue DAPT, statin - no s/s ischemia - eventual referral to CR  2. Chronic diastolic HF - echo 5/00/93; EF 60-65% Grade I DD - fluid is up - continue lasix 40 mg daily - place compression hose - start spiro 25 daily. Can stop K - check labs - eventually add back losartan or Entresto  3. COPD with chronic hypoxic respiratory failure - he has quit smoking. I congratulated him   4. AKI, post-op  - resolved  5.  Carotid stenosis  - s/p R CEA 10/07/19 - follows with Dr. Donnetta Hutching  6. HTN - BP improved. Will start spiro as above  7. DM2 - consider Jardiance  8. Post-op AF - in NSR - decrease amio to 400 daily   9. Post-op anxiety - start celexa 10  - can use melatonin at night to help sleep   F/u [facility]. Can return as needed if I can help.   Glori Bickers, MD  11:30 AM

## 2019-10-18 DIAGNOSIS — Z951 Presence of aortocoronary bypass graft: Secondary | ICD-10-CM | POA: Diagnosis not present

## 2019-10-18 DIAGNOSIS — I6521 Occlusion and stenosis of right carotid artery: Secondary | ICD-10-CM | POA: Diagnosis not present

## 2019-10-18 DIAGNOSIS — I25111 Atherosclerotic heart disease of native coronary artery with angina pectoris with documented spasm: Secondary | ICD-10-CM | POA: Diagnosis not present

## 2019-10-18 DIAGNOSIS — I252 Old myocardial infarction: Secondary | ICD-10-CM | POA: Diagnosis not present

## 2019-10-18 DIAGNOSIS — J449 Chronic obstructive pulmonary disease, unspecified: Secondary | ICD-10-CM | POA: Diagnosis not present

## 2019-10-18 DIAGNOSIS — I5032 Chronic diastolic (congestive) heart failure: Secondary | ICD-10-CM | POA: Diagnosis not present

## 2019-10-21 DIAGNOSIS — I6521 Occlusion and stenosis of right carotid artery: Secondary | ICD-10-CM | POA: Diagnosis not present

## 2019-10-21 DIAGNOSIS — I5032 Chronic diastolic (congestive) heart failure: Secondary | ICD-10-CM | POA: Diagnosis not present

## 2019-10-21 DIAGNOSIS — J449 Chronic obstructive pulmonary disease, unspecified: Secondary | ICD-10-CM | POA: Diagnosis not present

## 2019-10-21 DIAGNOSIS — I252 Old myocardial infarction: Secondary | ICD-10-CM | POA: Diagnosis not present

## 2019-10-21 DIAGNOSIS — I25111 Atherosclerotic heart disease of native coronary artery with angina pectoris with documented spasm: Secondary | ICD-10-CM | POA: Diagnosis not present

## 2019-10-21 DIAGNOSIS — Z951 Presence of aortocoronary bypass graft: Secondary | ICD-10-CM | POA: Diagnosis not present

## 2019-10-24 DIAGNOSIS — I25111 Atherosclerotic heart disease of native coronary artery with angina pectoris with documented spasm: Secondary | ICD-10-CM | POA: Diagnosis not present

## 2019-10-24 DIAGNOSIS — I6521 Occlusion and stenosis of right carotid artery: Secondary | ICD-10-CM | POA: Diagnosis not present

## 2019-10-24 DIAGNOSIS — Z951 Presence of aortocoronary bypass graft: Secondary | ICD-10-CM | POA: Diagnosis not present

## 2019-10-24 DIAGNOSIS — J449 Chronic obstructive pulmonary disease, unspecified: Secondary | ICD-10-CM | POA: Diagnosis not present

## 2019-10-24 DIAGNOSIS — I252 Old myocardial infarction: Secondary | ICD-10-CM | POA: Diagnosis not present

## 2019-10-24 DIAGNOSIS — I5032 Chronic diastolic (congestive) heart failure: Secondary | ICD-10-CM | POA: Diagnosis not present

## 2019-10-24 NOTE — Progress Notes (Signed)
Office Visit    Patient Name: Joshua Roy Date of Encounter: 10/25/2019  Primary Care Provider:  [name], MD Primary Cardiologist:  Ida Rogue, MD Electrophysiologist:  None   Chief Complaint    [name] is a 69 y.o. male with a hx of  CAD s/p CABG, chronic diastolic heart failure, carotid disease s/p right CEA, DM 2, HTN, HLD, tobacco abuse, bronchitis, COPD presents today for follow up after CABG.   Past Medical History    Past Medical History:  Diagnosis Date  . (HFpEF) heart failure with preserved ejection fraction ([place])    a. 07/2019 Echo: EF 60-65%. Mod LVH. Gr1 DD. No rwma. Nl RV size/fxn. Mildly dil LA.  Marland Kitchen ACE-inhibitor cough   . Anxiety   . Bronchitis 06/27/2016  . COPD (chronic obstructive pulmonary disease) (HCC)    cath, mild inf. hypokinesis EF 49%, 100% ROA  . Coronary artery disease 2001   a. 2001 Cath: LM 10, LAD 60p/m, D1 30, LCX 20p, RCA 127m/d; b. 05/2017 Ex Mv: Ex time 5:46, antlat twi @ rest, fixed infarct w/ mild peri-infarct ischemia. EF 38%.  . Diabetes mellitus type II   . Hyperlipidemia   . Hypertension   . MI (myocardial infarction) ([place]) age 30  . OA (osteoarthritis)    knee OA, injected 2012 by ortho  . Pneumonia    Past Surgical History:  Procedure Laterality Date  . CARDIAC CATHETERIZATION  05/06/2000   @ [place]  . COLONOSCOPY WITH PROPOFOL N/A 04/20/2018   Procedure: COLONOSCOPY WITH PROPOFOL;  Surgeon: [name], MD;  Location: [facility] END[facility]COPY;  Service: Endoscopy;  Laterality: N/A;  . CORONARY ARTERY BYPASS GRAFT N/A 10/07/2019   Procedure: CORONARY ARTERY BYPASS GRAFTING (CABG) using LIMA to LAD; Endoscopic harvesting of left greater saphenous vein: SVG to RAMUS; SVG to OM1; SVG to PDA.;  Surgeon: Gaye Pollack, MD;  Location: [place];  Service: Open Heart Surgery;  Laterality: N/A;  . ENDARTERECTOMY Right 10/07/2019   Procedure: ENDARTERECTOMY CAROTID;  Surgeon: Rosetta Posner, MD;  Location: [facility] OR;  Service:  Vascular;  Laterality: Right;  . ENDOVEIN HARVEST OF GREATER SAPHENOUS VEIN Left 10/07/2019   Procedure: [place] Of Greater Saphenous Vein;  Surgeon: Gaye Pollack, MD;  Location[facility] OR;  Service: Open Heart Surgery;  Laterality: Left;  . ESOPHAGOGASTRODUODENOSCOPY ENDOSCOPY    . KNEE ARTHROSCOPY  07/25/04   left  . [place]   right, open surgery- repair  . RIGHT/LEFT HEART CATH AND CORONARY ANGIOGRAPHY Bilateral 09/08/2019   Procedure: RIGHT/LEFT HEART CATH AND CORONARY ANGIOGRAPHY;  Surgeon: Minna Merritts, MD;  Location: [place] CV LAB;  Service: Cardiovascular;  Laterality: Bilateral;  . TEE WITHOUT CARDIOVERSION N/A 10/07/2019   Procedure: TRANSESOPHAGEAL ECHOCARDIOGRAM (TEE);  Surgeon: Gaye Pollack, MD;  Location: [place];  Service: Open Heart Surgery;  Laterality: N/A;  . 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 TOTAL KNEE ARTHROPLASTY Right 07/13/2017   Procedure: RIGHT TOTAL KNEE ARTHROPLASTY;  Surgeon: Gaynelle Arabian, MD;  Location: [facility]S;  Service: Orthopedics;  Laterality: Right;    Allergies  Allergies  Allergen Reactions  . Ramipril Cough  . Mucinex D [Pseudoephedrine-Guaifenesin Er] Other (See Comments) and Hypertension    Elevated BP, insomnia    History of Present Illness    Joshua Roy is a 69 y.o.  male with a hx of CAD s/p CABG, chronic diastolic heart failure, carotid disease s/p right CEA, DM 2, HTN, HLD, tobacco abuse, bronchitis, COPD last seen while hospitalized.  CAD is s/p MI at age 1.  Catheterization at the time with occluded mid to distal RCA with otherwise moderate coronary disease.  January 2020 he had multiple ED visits and office visits for fluid retention with echo showing normal LVEF and grade 1 diastolic dysfunction.  Lost nearly 30 pounds of fluid.  Underwent cardiac  catheterization 09/08/2019 with recommendation for CABG. He declined inpatient evaluation and was worked up as an outpatient.  Underwent CABGx4 and right carotid endarterectomy with dacron patch angioplasty on  10/07/2019.  Postoperative.  Notable for atrial fibrillation but converted to sinus with amiodarone.  He required diuretics postoperatively.  Norvasc was started for postoperative hypertension as ARB could not be started due to renal insufficiency.  Seen by Dr. Haroldine Laws for hospital follow up 10/17/19. He was started on Celexa 10mg  for post operative anxiety. Potassium supplement stopped and Spironolactone 25mg  daily started of HFpEF and volume overload with plan to eventually add back Losartan or Entresto. His Amiodarone for post-op atrial fib was reduced to 400mg  daily.   He did not start Celexa and reports his sleep has improved. Is not interested in the medication at this time.   Reports feeling overall well. Has been walking in the house. Has been working up to doing 5 laps around the house once per day. Reports no chest pain, pressure, tightness. Very interested in cardiac rehab.  BP at home routinely 130s/80s. Does have [place] nurse checking on him. Reports improvement in volume status and edema since addition of Spironolactone.  Does have congested cough. Reports some phlegm which is white or clear. Has been drinking water to help thin and "get it up"  EKGs/Labs/Other Studies Reviewed:   The following studies were reviewed today:  Echo intraoperative TEE 10/07/19 POST-OP IMPRESSIONS  Overall, there were no significant changes from pre-bypass.   PRE-OP FINDINGS   Left Ventricle: The left ventricle has normal systolic function, with an  ejection fraction of 60-65%. The cavity size was normal. There is severely  increased left ventricular wall thickness.   Right Ventricle: The right ventricle has normal systolic function. The  cavity was normal. There is increased right ventricular  wall thickness.   Left Atrium: Left atrial size was not assessed. The left atrial appendage  is well visualized and there is no evidence of thrombus present.   Right Atrium: Right atrial size was not assessed. Right atrial pressure is  estimated at 10 mmHg.   Interatrial Septum: No atrial level shunt detected by color flow Doppler.   Pericardium: There is no evidence of pericardial effusion.   Mitral Valve: The mitral valve is normal in structure. No thickening of  the mitral valve leaflet. No calcification of the mitral valve leaflet.  Mitral valve regurgitation is not visualized by color flow Doppler.   Tricuspid Valve: The tricuspid valve was normal in structure. Tricuspid  valve regurgitation was not visualized by color flow Doppler.   Aortic Valve: The aortic valve is normal in structure. There is mild  thickening of the aortic valve and There is mild calcification of the  aortic valve Aortic valve regurgitation was not visualized by color flow  Doppler. There is no evidence of aortic  valve stenosis.   Pulmonic Valve: The pulmonic valve was normal in structure.  Pulmonic valve regurgitation is not visualized by color flow  Doppler.   R/L heart cath 09/08/19  Mid LM to Dist LM lesion is 70% stenosed.  Ost LM lesion is 40% stenosed.  Prox LAD to Mid LAD lesion is 70% stenosed.  Mid Cx lesion is 80% stenosed.  Ost RCA to Prox RCA lesion is 95% stenosed.  Mid RCA to Dist RCA lesion is 100% stenosed.  RPAV lesion is 90% stenosed.  Hemodynamic findings consistent with moderate pulmonary hypertension.  LV end diastolic pressure is normal.   EKG:  EKG is ordered today.  The ekg ordered today demonstrates SR 61 bpm with 1st degree AV block PR 230 ms with righward axis. No acute ST/T wave changes.   Recent Labs: 10/05/2019: ALT 15 10/08/2019: Magnesium 2.1 10/14/2019: Hemoglobin 10.5; Platelets 301 10/17/2019: B Natriuretic Peptide 252.8; BUN 16; Creatinine, Ser 1.42;  Potassium 4.2; Sodium 134  Recent Lipid Panel    Component Value Date/Time   CHOL 84 10/11/2019 0254   CHOL 113 05/14/2013 0252   TRIG 93 10/11/2019 0254   TRIG 232 (H) 05/14/2013 0252   HDL 26 (L) 10/11/2019 0254   HDL 23 (L) 05/14/2013 0252   CHOLHDL 3.2 10/11/2019 0254   VLDL 19 10/11/2019 0254   VLDL 46 (H) 05/14/2013 0252   LDLCALC 39 10/11/2019 0254   LDLCALC 44 05/14/2013 0252   LDLDIRECT 109.0 11/15/2014 0851    Home Medications   Current Meds  Medication Sig  . acetaminophen (TYLENOL) 500 MG tablet Take 1,000 mg by mouth every 8 (eight) hours as needed for mild pain or headache.  . ALPRAZolam (XANAX) 0.25 MG tablet Take 0.25 mg by mouth 2 (two) times daily as needed for anxiety.   Marland Kitchen amiodarone (PACERONE) 400 MG tablet Take 1 tablet (400 mg total) by mouth 2 (two) times daily. For 7 days then once daily  . amLODipine (NORVASC) 5 MG tablet Take 1 tablet (5 mg total) by mouth daily.  Marland Kitchen aspirin EC 81 MG tablet Take 1 tablet (81 mg total) by mouth daily.  Marland Kitchen atorvastatin (LIPITOR) 80 MG tablet Take 1 tablet (80 mg total) by mouth daily.  . clopidogrel (PLAVIX) 75 MG tablet Take 75 mg by mouth daily.  . fenofibrate 160 MG tablet Take 1 tablet (160 mg total) by mouth daily.  . furosemide (LASIX) 40 MG tablet Take 1 tablet (40 mg total) by mouth daily.  Marland Kitchen glimepiride (AMARYL) 2 MG tablet Take one half tablet (1 mg) by mouth each morning (Patient taking differently: Take 2 mg by mouth daily with breakfast. )  . glucose blood (ACCU-CHEK AVIVA PLUS) test strip Use to check blood sugar once daily or as needed.  Diagnosis:  E11.9   Non insulin-dependent.  . Lancets (ACCU-CHEK MULTICLIX) lancets Use as instructed to check blood sugar once daily or as needed.  Diagnosis:  E11.9   Non insulin-dependent.  . metFORMIN (GLUCOPHAGE) 1000 MG tablet Take 1 tablet (1,000 mg total) by mouth 2 (two) times daily.  . metoprolol tartrate (LOPRESSOR) 25 MG tablet Take 1 tablet (25 mg total) by mouth 2  (two) times daily.  . nicotine (NICODERM CQ - DOSED IN MG/24 HOURS) 21 mg/24hr patch Place 21 mg onto the skin daily as needed (nicotine dependence).   . pantoprazole (PROTONIX) 40 MG tablet Take 40 mg by mouth daily before breakfast.  . spironolactone (ALDACTONE) 25 MG tablet Take 1 tablet (25 mg total) by mouth daily.    Review of Systems     Review of Systems  Constitution: Negative for chills,  fever and malaise/fatigue.  Cardiovascular: Positive for leg swelling. Negative for chest pain, dyspnea on exertion, irregular heartbeat, near-syncope, orthopnea, palpitations and syncope.  Respiratory: Negative for cough, shortness of breath and wheezing.   Gastrointestinal: Negative for melena, nausea and vomiting.  Genitourinary: Negative for hematuria.  Neurological: Negative for dizziness, light-headedness and weakness.   All other systems reviewed and are otherwise negative except as noted above.  Physical Exam    VS:  BP 140/70 (BP Location: Left Arm, Patient Position: Sitting, Cuff Size: Normal)   Pulse 61   Ht 5\' 10"  (1.778 m)   Wt 252 lb (114.3 kg)   SpO2 93%   BMI 36.16 kg/m  , BMI Body mass index is 36.16 kg/m. GEN: Well nourished, overweight, well developed, in no acute distress. HEENT: normal. Neck: Supple, no JVD, carotid bruits, or masses. Cardiac: RRR, no murmurs, rubs, or gallops. No clubbing, cyanosis, edema.  Radials/PT 2+ and equal bilaterally.  Respiratory:  Respirations regular and unlabored. LLL with rhonchi. RLL diminished.  GI: Soft, nontender, nondistended, BS + x 4. MS: No deformity or atrophy. Skin: Warm and dry, no rash. Midsternal incision, R carotid incision, and LLE vein graft incision are all clean, dry, intact and healing appropriately. No ecchymosis, erythema, exudate. Edges all well approximated.  Neuro:  Strength and sensation are intact. Psych: Normal affect.  Assessment & Plan    1. CAD s/p CABG 10/07/19 (LIMA-LAD, SVG-OM, SVG-Ramus, SVG-RCA) -     No anginal symptoms. EKG today NSR.   Surgical sites including midsternal and LLE vein graft healing appropriately.   Continue GDMT DAPT Aspirin/Plavix, high intensity statin, beta blocker.   Referral to cardiac rehab placed during hospitalization. Plans to participate virtually.  Post op with Dr. Cyndia Bent on 11/02/19.  2. Chronic diastolic heart failure - Echo Jan 2021 LVEF 60-65%, gr1DD.   Spironolactone added by Dr. Haroldine Laws 10/17/19. BMP today. Dr. Haroldine Laws recommended to follow with primary cardiologist and will see patient on as-needed basis.   GDMT includes beta blocker, MRA, loop diuretic. No Losartan at this time due to kidney dysfunction and postoperative AKI.  Weighing daily at home. Educated to report weight gain of 2lb overnight or 5lb in 1 week. Reports weights have been decreasing.   Low sodium diet recommended..  3. COPD/Tobacco abuse - Using nicotine patch to quit, congratulated current cessation efforts. Has reduced significantly. Smoking cessation encouraged. Recommend utilization of 1800QUITNOW. Noted productive cough and rhonchi to LLL which clears after cough - encouraged use of incentive spirometer, OTC Muccinex, and water to thin secretions.   4. Postoperative AKI - Labs 10/17/19 with creatinine 1.42, GFR 50, BNP 252.8 at visit with Dr. Haroldine Laws. Spironolacton 25mg  daily was added. Repeat BMP today.   5. Carotid stenosis - s/p R CEA 10/07/19. Follows with Dr. Tawni Millers. Continue aspirin, plavix, statin.   6. HTN - BP elevated. Continue Amlodipine 5mg  daily. Consider increased dose vs resumption of Losartan pending BMET results.   7. Post operative atrial fibrilation - Amio decreased to 400mg  daily 10/17/19. Will defer further changes to Dr. Cyndia Bent at upcoming appointment.  8. DM2 - Follows with PCP. Has been getting low blood sugars as low as 61, 66, 71, 77, 100 today. However, A1c while hospitalized 8.2. Eats dinner around 6pm, then sometimes has a bowl of ice  cream. Encouraged bedtime snack with protein and to contact his PCP. If additional agent needed, consider SGLT2i for cardioprotective benefit.  9. HLD, LDL goal <70 - LDL while admitted <70. Continue  Atorvastatin 80mg  daily.   Disposition: Follow up in 6 week(s) with Dr. Rockey Situ or APP   Loel Dubonnet, NP 10/25/2019, 1:42 PM

## 2019-10-25 ENCOUNTER — Ambulatory Visit (INDEPENDENT_AMBULATORY_CARE_PROVIDER_SITE_OTHER): Payer: Medicare Other | Admitting: Family

## 2019-10-25 ENCOUNTER — Encounter: Payer: Self-pay | Admitting: Family

## 2019-10-25 ENCOUNTER — Other Ambulatory Visit: Payer: Self-pay

## 2019-10-25 VITALS — BP 140/70 | HR 61 | Ht 70.0 in | Wt 252.0 lb

## 2019-10-25 DIAGNOSIS — E785 Hyperlipidemia, unspecified: Secondary | ICD-10-CM | POA: Diagnosis not present

## 2019-10-25 DIAGNOSIS — I25119 Atherosclerotic heart disease of native coronary artery with unspecified angina pectoris: Secondary | ICD-10-CM

## 2019-10-25 DIAGNOSIS — Z951 Presence of aortocoronary bypass graft: Secondary | ICD-10-CM | POA: Diagnosis not present

## 2019-10-25 DIAGNOSIS — I5032 Chronic diastolic (congestive) heart failure: Secondary | ICD-10-CM | POA: Diagnosis not present

## 2019-10-25 DIAGNOSIS — I10 Essential (primary) hypertension: Secondary | ICD-10-CM

## 2019-10-25 DIAGNOSIS — I6521 Occlusion and stenosis of right carotid artery: Secondary | ICD-10-CM | POA: Diagnosis not present

## 2019-10-25 NOTE — Patient Instructions (Addendum)
Medication Instructions:  Continue current medications.   You may use Guafenesin (muccinex) over the counter for cough.   *If you need a refill on your cardiac medications before your next appointment, please call your pharmacy*   Lab Work: Your physician recommends that you return for lab work today: BMET  This will let us check your kidney function and electrolytes after medication changes last week.   If you have labs (blood work) drawn today and your tests are completely normal, you will receive your results only by: Marland Kitchen MyChart Message (if you have MyChart) OR . A paper copy in the mail If you have any lab test that is abnormal or we need to change your treatment, we will call you to review the results.   Testing/Procedures: Your EKG today showed normal sinus rhythm.  Follow-Up: At [facility], you and your health needs are our priority.  As part of our continuing mission to provide you with exceptional heart care, we have created designated Provider Care Teams.  These Care Teams include your primary Cardiologist (physician) and Advanced Practice Providers (APPs -  Physician Assistants and Nurse Practitioners) who all work together to provide you with the care you need, when you need it.  We recommend signing up for the patient portal called "MyChart".  Sign up information is provided on this After Visit Summary.  MyChart is used to connect with patients for Virtual Visits (Telemedicine).  Patients are able to view lab/test results, encounter notes, upcoming appointments, etc.  Non-urgent messages can be sent to your provider as well.   To learn more about what you can do with MyChart, go to [email or web address].    Your next appointment:   6 week(s)  The format for your next appointment:   In Person  Provider:   You may see Ida Rogue, MD or one of the following Advanced Practice Providers on your designated Care Team:    Murray Hodgkins, NP  Christell Faith,  PA-C  Marrianne Mood, PA-C  Other Instructions   Call our office for weight gain of 2lbs overnight or 5lbs in one week. This is a sign of extra fluid. We will likely have you take an extra fluid pill, but call us so we can determine a plan together.   Dr. Cyndia Bent will discuss Amiodarone with you (the medication to prevent the atrial fibrillation or irregular heart beat you had after surgery). This medication will likely be reduced and eventually discontinued.    Keep a blood pressure log at home. If your blood pressure is consistently more than 130/80, please let us know. We will adjust your blood pressure medication based off of your labs today. We will call you with your lab results and recommendations tomorrow.   In anyone with heart disease we want them to be on the "A, B, C's". A is aspirin. B is a beta blocker which is your Metoprolol. C is cholesterol medication.    Recommend a bedtime snack with protein to prevent low blood sugars. This would be crackers and peanut butter, a handful of mixed nuts, an apple and PB.

## 2019-10-26 ENCOUNTER — Telehealth: Payer: Self-pay

## 2019-10-26 LAB — BASIC METABOLIC PANEL
BUN/Creatinine Ratio: 10 (ref 10–24)
BUN: 16 mg/dL (ref 8–27)
CO2: 26 mmol/L (ref 20–29)
Calcium: 9.3 mg/dL (ref 8.6–10.2)
Chloride: 94 mmol/L — ABNORMAL LOW (ref 96–106)
Creatinine, Ser: 1.6 mg/dL — ABNORMAL HIGH (ref 0.76–1.27)
GFR calc Af Amer: 50 mL/min/{1.73_m2} — ABNORMAL LOW (ref >59)
GFR calc non Af Amer: 43 mL/min/{1.73_m2} — ABNORMAL LOW (ref >59)
Glucose: 110 mg/dL — ABNORMAL HIGH (ref 65–99)
Potassium: 4.5 mmol/L (ref 3.5–5.2)
Sodium: 136 mmol/L (ref 134–144)

## 2019-10-26 MED ORDER — AMLODIPINE BESYLATE 10 MG PO TABS: 10.0000 mg | ORAL_TABLET | Freq: Every day | ORAL | 3 refills | Status: DC

## 2019-10-26 NOTE — Telephone Encounter (Signed)
Incoming call returned by patient.   Pt verbalized understanding or results and POC.   Agrees to plan.   Rx updated and sent electronically to pharmacy on file.   Confirmed upcoming appt in may.   Advised pt to call for any further questions or concerns.

## 2019-10-26 NOTE — Telephone Encounter (Signed)
-----   Message from Loel Dubonnet, NP sent at 10/26/2019  7:32 AM EDT ----- Electrolytes normal. Kidney function with mild decline compared to discharge, but previous compared to hospitalization.   We discussed adding Losartan vs adding Amlodipine in the office. Due to kidney function, recommend increasing Amlodipine to 10mg  daily. He may use up 5mg  tablets by taking 2 until he runs out. Please send in Rx for 10mg  tablet

## 2019-10-26 NOTE — Telephone Encounter (Signed)
Call attempted to review result. No answer, mailbox full.

## 2019-10-28 DIAGNOSIS — I5032 Chronic diastolic (congestive) heart failure: Secondary | ICD-10-CM | POA: Diagnosis not present

## 2019-10-28 DIAGNOSIS — I25111 Atherosclerotic heart disease of native coronary artery with angina pectoris with documented spasm: Secondary | ICD-10-CM | POA: Diagnosis not present

## 2019-10-28 DIAGNOSIS — I252 Old myocardial infarction: Secondary | ICD-10-CM | POA: Diagnosis not present

## 2019-10-28 DIAGNOSIS — Z951 Presence of aortocoronary bypass graft: Secondary | ICD-10-CM | POA: Diagnosis not present

## 2019-10-28 DIAGNOSIS — I6521 Occlusion and stenosis of right carotid artery: Secondary | ICD-10-CM | POA: Diagnosis not present

## 2019-10-28 DIAGNOSIS — J449 Chronic obstructive pulmonary disease, unspecified: Secondary | ICD-10-CM | POA: Diagnosis not present

## 2019-11-01 ENCOUNTER — Encounter: Payer: Self-pay | Admitting: Vascular Surgery

## 2019-11-01 ENCOUNTER — Other Ambulatory Visit: Payer: Self-pay | Admitting: Surgery

## 2019-11-01 ENCOUNTER — Other Ambulatory Visit: Payer: Self-pay

## 2019-11-01 ENCOUNTER — Ambulatory Visit (INDEPENDENT_AMBULATORY_CARE_PROVIDER_SITE_OTHER): Payer: Self-pay | Admitting: Vascular Surgery

## 2019-11-01 VITALS — BP 136/72 | HR 62 | Temp 97.7°F | Resp 20 | Ht 70.0 in | Wt 251.0 lb

## 2019-11-01 DIAGNOSIS — Z951 Presence of aortocoronary bypass graft: Secondary | ICD-10-CM

## 2019-11-01 DIAGNOSIS — I6521 Occlusion and stenosis of right carotid artery: Secondary | ICD-10-CM

## 2019-11-01 NOTE — Progress Notes (Signed)
Patient name: Joshua Roy MRN: [id number] DOB: 1950/12/09 Sex: male  REASON FOR VISIT: Follow-up right carotid endarterectomy combined with coronary artery bypass grafting with Dr. Cyndia Bent  HPI: Joshua Roy is a 69 y.o. male here today for follow-up.  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  He did well.  He had no neurologic deficits and was discharged home.  He reports the expected fatigue after major surgery.  He has had no neurologic deficits  Current Outpatient Medications  Medication Sig Dispense Refill  . acetaminophen (TYLENOL) 500 MG tablet Take 1,000 mg by mouth every 8 (eight) hours as needed for mild pain or headache.    . ALPRAZolam (XANAX) 0.25 MG tablet Take 0.25 mg by mouth 2 (two) times daily as needed for anxiety.     Marland Kitchen amiodarone (PACERONE) 400 MG tablet Take 1 tablet (400 mg total) by mouth 2 (two) times daily. For 7 days then once daily 70 tablet 1  . amLODipine (NORVASC) 10 MG tablet Take 1 tablet (10 mg total) by mouth daily. 90 tablet 3  . aspirin EC 81 MG tablet Take 1 tablet (81 mg total) by mouth daily.    Marland Kitchen atorvastatin (LIPITOR) 80 MG tablet Take 1 tablet (80 mg total) by mouth daily. 90 tablet 3  . clopidogrel (PLAVIX) 75 MG tablet Take 75 mg by mouth daily.    . fenofibrate 160 MG tablet Take 1 tablet (160 mg total) by mouth daily. 90 tablet 1  . furosemide (LASIX) 40 MG tablet Take 1 tablet (40 mg total) by mouth daily. 30 tablet 1  . glimepiride (AMARYL) 2 MG tablet Take one half tablet (1 mg) by mouth each morning (Patient taking differently: Take 2 mg by mouth daily with breakfast. ) 45 tablet 1  . glucose blood (ACCU-CHEK AVIVA PLUS) test strip Use to check blood sugar once daily or as needed.  Diagnosis:  E11.9   Non insulin-dependent. 100 each 3  . Lancets (ACCU-CHEK MULTICLIX) lancets Use as instructed to check blood sugar once daily or as needed.  Diagnosis:  E11.9   Non insulin-dependent. 102  each 3  . losartan-hydrochlorothiazide (HYZAAR) 50-12.5 MG tablet Take 1 tablet by mouth daily.    . metFORMIN (GLUCOPHAGE) 1000 MG tablet Take 1 tablet (1,000 mg total) by mouth 2 (two) times daily. 180 tablet 1  . metoprolol tartrate (LOPRESSOR) 25 MG tablet Take 1 tablet (25 mg total) by mouth 2 (two) times daily. 180 tablet 1  . nicotine (NICODERM CQ - DOSED IN MG/24 HOURS) 21 mg/24hr patch Place 21 mg onto the skin daily as needed (nicotine dependence).     . pantoprazole (PROTONIX) 40 MG tablet Take 40 mg by mouth daily before breakfast.    . spironolactone (ALDACTONE) 25 MG tablet Take 1 tablet (25 mg total) by mouth daily. 90 tablet 3   No current facility-administered medications for this visit.     PHYSICAL EXAM: Vitals:   11/01/19 1516 11/01/19 1518  BP: 136/78 136/72  Pulse: 62   Resp: 20   Temp: 97.7 F (36.5 C)   SpO2: 94%   Weight: 251 lb (113.9 kg)   Height: 5\' 10"  (1.778 m)     GENERAL: The patient is a well-nourished male, in no acute distress. The vital signs are documented above. His right neck incision is well-healed with no evidence of bruit present.  He has no left carotid bruit  MEDICAL ISSUES: Stable status post combined carotid and coronary surgery.  His had  no neurologic deficits.  He will be seen again in 9 months with repeat carotid duplex.  His preoperative duplex showed no evidence of stenosis in his left carotid artery   Rosetta Posner, MD FACS [facility] Tel [phone] Pager [phone]

## 2019-11-02 ENCOUNTER — Ambulatory Visit (INDEPENDENT_AMBULATORY_CARE_PROVIDER_SITE_OTHER): Payer: Self-pay | Admitting: Surgery

## 2019-11-02 ENCOUNTER — Encounter: Payer: Self-pay | Admitting: Surgery

## 2019-11-02 ENCOUNTER — Ambulatory Visit
Admission: RE | Admit: 2019-11-02 | Discharge: 2019-11-02 | Disposition: A | Discharge status: Home / Self Care | Payer: Medicare Other | Source: Ambulatory Visit | Attending: Surgery | Admitting: Surgery

## 2019-11-02 VITALS — BP 118/66 | HR 66 | Temp 97.6°F | Resp 20 | Ht 70.0 in | Wt 250.0 lb

## 2019-11-02 DIAGNOSIS — I517 Cardiomegaly: Secondary | ICD-10-CM | POA: Diagnosis not present

## 2019-11-02 DIAGNOSIS — Z951 Presence of aortocoronary bypass graft: Secondary | ICD-10-CM

## 2019-11-02 DIAGNOSIS — R918 Other nonspecific abnormal finding of lung field: Secondary | ICD-10-CM | POA: Diagnosis not present

## 2019-11-02 MED ORDER — TRAMADOL HCL 50 MG PO TABS: 50.0000 mg | ORAL_TABLET | Freq: Four times a day (QID) | ORAL | 0 refills | Status: DC | PRN

## 2019-11-02 NOTE — Progress Notes (Signed)
HPI: Patient returns for routine postoperative follow-up having undergone concomitant right carotid endarterectomy by Dr. Sherren Mocha Early and coronary bypass graft surgery x4 by me on 10/07/2019. The patient's early postoperative recovery while in the hospital was notable for development of postoperative atrial fibrillation.  He was started on amiodarone and converted to sinus rhythm.  He also remained oxygen dependent and was sent home on oxygen.  This is subsequently been weaned. Since hospital discharge the patient reports that he feels better than before but still has some exertional shortness of breath and weakness in his legs.  He has been walking in the house but has not gotten outside much.  He saw Dr. Haroldine Laws in the heart failure clinic for initial follow-up and was started on daily Lasix and spironolactone for volume overload.  He reports that he has to urinate a lot particularly at night.  He continues to abstain from smoking but said it has been difficult and he has been dreaming about cigarettes.  His wife is with him today and feels that he is making progress.  He is still having some chest wall discomfort and has been taking Ultram as needed.  He asked me to refill his prescription 1 more time.  He also said that he has been checking his blood sugars at home and they have all been less than 120 with some blood sugars in the morning being down the 60s.   Current Outpatient Medications  Medication Sig Dispense Refill  . acetaminophen (TYLENOL) 500 MG tablet Take 1,000 mg by mouth every 8 (eight) hours as needed for mild pain or headache.    . ALPRAZolam (XANAX) 0.25 MG tablet Take 0.25 mg by mouth 2 (two) times daily as needed for anxiety.     Marland Kitchen amiodarone (PACERONE) 400 MG tablet Take 1 tablet (400 mg total) by mouth 2 (two) times daily. For 7 days then once daily 70 tablet 1  . amLODipine (NORVASC) 10 MG tablet Take 1 tablet (10 mg total) by mouth daily. 90 tablet 3  . aspirin EC 81 MG  tablet Take 1 tablet (81 mg total) by mouth daily.    Marland Kitchen atorvastatin (LIPITOR) 80 MG tablet Take 1 tablet (80 mg total) by mouth daily. 90 tablet 3  . clopidogrel (PLAVIX) 75 MG tablet Take 75 mg by mouth daily.    . fenofibrate 160 MG tablet Take 1 tablet (160 mg total) by mouth daily. 90 tablet 1  . furosemide (LASIX) 40 MG tablet Take 1 tablet (40 mg total) by mouth daily. 30 tablet 1  . glimepiride (AMARYL) 2 MG tablet Take one half tablet (1 mg) by mouth each morning (Patient taking differently: Take 2 mg by mouth daily with breakfast. ) 45 tablet 1  . glucose blood (ACCU-CHEK AVIVA PLUS) test strip Use to check blood sugar once daily or as needed.  Diagnosis:  E11.9   Non insulin-dependent. 100 each 3  . Lancets (ACCU-CHEK MULTICLIX) lancets Use as instructed to check blood sugar once daily or as needed.  Diagnosis:  E11.9   Non insulin-dependent. 102 each 3  . losartan-hydrochlorothiazide (HYZAAR) 50-12.5 MG tablet Take 1 tablet by mouth daily.    . metFORMIN (GLUCOPHAGE) 1000 MG tablet Take 1 tablet (1,000 mg total) by mouth 2 (two) times daily. 180 tablet 1  . metoprolol tartrate (LOPRESSOR) 25 MG tablet Take 1 tablet (25 mg total) by mouth 2 (two) times daily. 180 tablet 1  . nicotine (NICODERM CQ - DOSED IN  MG/24 HOURS) 21 mg/24hr patch Place 21 mg onto the skin daily as needed (nicotine dependence).     . pantoprazole (PROTONIX) 40 MG tablet Take 40 mg by mouth daily before breakfast.    . spironolactone (ALDACTONE) 25 MG tablet Take 1 tablet (25 mg total) by mouth daily. 90 tablet 3   No current facility-administered medications for this visit.    Physical Exam: BP 118/66   Pulse 66   Temp 97.6 F (36.4 C) (Skin)   Resp 20   Ht 5\' 10"  (1.778 m)   Wt 250 lb (113.4 kg)   SpO2 91% Comment: RA  BMI 35.87 kg/m  He looks well. Cardiac exam shows a regular rate and rhythm with normal heart sounds. Lungs are clear. Chest incision is healing well and the sternum is stable. Both  leg incisions are healing well.  There is mild bilateral lower leg edema.  Diagnostic Tests:  CLINICAL DATA:  History of CABG  EXAM: CHEST - 2 VIEW  COMPARISON:  10/10/2019  FINDINGS: Mild cardiomegaly. Mild, diffuse interstitial pulmonary opacity. Disc degenerative disease of the thoracic spine.  IMPRESSION: Mild cardiomegaly with mild, diffuse interstitial pulmonary opacity, likely edema. No new or focal airspace opacity.   Electronically Signed   By: Eddie Candle M.D.   On: 11/02/2019 13:51  Impression:  Overall I think he is making good progress for 1 month postoperatively.  He appears to be maintaining sinus rhythm and I asked him to decrease the amiodarone to 200 mg daily.  This could probably be discontinued in the next month or so.  I will leave that decision up to Dr. Rockey Situ when he sees him back on 12/20/2019.  He is having some hypoglycemic episodes and I told him that he could hold his Amaryl for couple days and continue Metformin to see where his blood sugars run.  If his blood sugars rise significantly off Amaryl he can start taking 1 mg of Amaryl daily.  His preoperative hemoglobin A1c was 8.2.  I renewed his Ultram today.  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  I encouraged him to get outside and walk to build up his stamina.  Also encouraged him to continue abstaining from smoking.  Plan:  He has a follow-up appointment with Dr. Rockey Situ on 12/20/2019.  He will return to see me if he has any problems with his incisions.   Gaye Pollack, MD [facility] [phone]

## 2019-11-05 ENCOUNTER — Other Ambulatory Visit: Payer: Self-pay | Admitting: Surgical

## 2019-11-14 ENCOUNTER — Telehealth: Payer: Self-pay

## 2019-11-14 ENCOUNTER — Other Ambulatory Visit: Payer: Self-pay

## 2019-11-14 MED ORDER — TRAZODONE HCL 50 MG PO TABS: 50.0000 mg | ORAL_TABLET | Freq: Every evening | ORAL | 0 refills | Status: DC | PRN

## 2019-11-14 NOTE — Telephone Encounter (Signed)
Pt called this morning to report that he is having problems sleeping. States he spoke w/ Dr. Cyndia Bent about this at [facility] last week and declined Dr. Vivi Martens recommendation/Rx for Trazodone. He states he now wants to try this d/t OTC meds being ineffective. Trazodone Rx called in per Dr. Vivi Martens request.

## 2019-11-25 DIAGNOSIS — E034 Atrophy of thyroid (acquired): Secondary | ICD-10-CM | POA: Diagnosis not present

## 2019-11-25 DIAGNOSIS — E7849 Other hyperlipidemia: Secondary | ICD-10-CM | POA: Diagnosis not present

## 2019-11-25 DIAGNOSIS — I10 Essential (primary) hypertension: Secondary | ICD-10-CM | POA: Diagnosis not present

## 2019-11-25 DIAGNOSIS — E119 Type 2 diabetes mellitus without complications: Secondary | ICD-10-CM | POA: Diagnosis not present

## 2019-11-25 DIAGNOSIS — R5381 Other malaise: Secondary | ICD-10-CM | POA: Diagnosis not present

## 2019-12-02 DIAGNOSIS — G4700 Insomnia, unspecified: Secondary | ICD-10-CM | POA: Diagnosis not present

## 2019-12-02 DIAGNOSIS — G4714 Hypersomnia due to medical condition: Secondary | ICD-10-CM | POA: Diagnosis not present

## 2019-12-02 DIAGNOSIS — E669 Obesity, unspecified: Secondary | ICD-10-CM | POA: Diagnosis not present

## 2019-12-02 DIAGNOSIS — R0683 Snoring: Secondary | ICD-10-CM | POA: Diagnosis not present

## 2019-12-05 ENCOUNTER — Telehealth: Payer: Self-pay | Admitting: Nurse Practitioner

## 2019-12-05 NOTE — Telephone Encounter (Signed)
Patient needs wife as support person for visit. He wants assistance discussing medications .

## 2019-12-05 NOTE — Telephone Encounter (Signed)
OV note updated that wife is coming to appt this wed. Sent pt note in epic to confirm.   Encounter completed at this time.

## 2019-12-07 ENCOUNTER — Inpatient Hospital Stay
Admission: EM | Admit: 2019-12-07 | Discharge: 2019-12-14 | DRG: 291 | Disposition: A | Discharge status: Home / Self Care | Payer: Medicare Other | Attending: Internal Medicine | Admitting: Internal Medicine

## 2019-12-07 ENCOUNTER — Encounter: Payer: Self-pay | Admitting: Nurse Practitioner

## 2019-12-07 ENCOUNTER — Ambulatory Visit (INDEPENDENT_AMBULATORY_CARE_PROVIDER_SITE_OTHER): Payer: Medicare Other | Admitting: Nurse Practitioner

## 2019-12-07 ENCOUNTER — Encounter: Payer: Self-pay | Admitting: Emergency Medicine

## 2019-12-07 ENCOUNTER — Encounter: Payer: Self-pay | Admitting: Internal Medicine

## 2019-12-07 ENCOUNTER — Emergency Department: Payer: Medicare Other

## 2019-12-07 ENCOUNTER — Other Ambulatory Visit: Payer: Self-pay

## 2019-12-07 ENCOUNTER — Inpatient Hospital Stay: Payer: Medicare Other

## 2019-12-07 VITALS — BP 106/52 | HR 67 | Ht 70.0 in | Wt 264.5 lb

## 2019-12-07 DIAGNOSIS — N183 Chronic kidney disease, stage 3 unspecified: Secondary | ICD-10-CM | POA: Diagnosis not present

## 2019-12-07 DIAGNOSIS — I5033 Acute on chronic diastolic (congestive) heart failure: Secondary | ICD-10-CM

## 2019-12-07 DIAGNOSIS — G4730 Sleep apnea, unspecified: Secondary | ICD-10-CM | POA: Diagnosis present

## 2019-12-07 DIAGNOSIS — Z800 Family history of malignant neoplasm of digestive organs: Secondary | ICD-10-CM

## 2019-12-07 DIAGNOSIS — Z720 Tobacco use: Secondary | ICD-10-CM | POA: Diagnosis present

## 2019-12-07 DIAGNOSIS — F17200 Nicotine dependence, unspecified, uncomplicated: Secondary | ICD-10-CM | POA: Diagnosis present

## 2019-12-07 DIAGNOSIS — E1122 Type 2 diabetes mellitus with diabetic chronic kidney disease: Secondary | ICD-10-CM | POA: Diagnosis present

## 2019-12-07 DIAGNOSIS — Z20822 Contact with and (suspected) exposure to covid-19: Secondary | ICD-10-CM | POA: Diagnosis not present

## 2019-12-07 DIAGNOSIS — J441 Chronic obstructive pulmonary disease with (acute) exacerbation: Secondary | ICD-10-CM | POA: Diagnosis present

## 2019-12-07 DIAGNOSIS — D631 Anemia in chronic kidney disease: Secondary | ICD-10-CM | POA: Diagnosis present

## 2019-12-07 DIAGNOSIS — Z6836 Body mass index (BMI) 36.0-36.9, adult: Secondary | ICD-10-CM

## 2019-12-07 DIAGNOSIS — Z7982 Long term (current) use of aspirin: Secondary | ICD-10-CM

## 2019-12-07 DIAGNOSIS — I472 Ventricular tachycardia: Secondary | ICD-10-CM | POA: Diagnosis present

## 2019-12-07 DIAGNOSIS — J9621 Acute and chronic respiratory failure with hypoxia: Secondary | ICD-10-CM | POA: Diagnosis present

## 2019-12-07 DIAGNOSIS — E785 Hyperlipidemia, unspecified: Secondary | ICD-10-CM | POA: Diagnosis not present

## 2019-12-07 DIAGNOSIS — Z7189 Other specified counseling: Secondary | ICD-10-CM | POA: Diagnosis not present

## 2019-12-07 DIAGNOSIS — N179 Acute kidney failure, unspecified: Secondary | ICD-10-CM | POA: Diagnosis present

## 2019-12-07 DIAGNOSIS — Z8249 Family history of ischemic heart disease and other diseases of the circulatory system: Secondary | ICD-10-CM

## 2019-12-07 DIAGNOSIS — G9341 Metabolic encephalopathy: Secondary | ICD-10-CM | POA: Diagnosis present

## 2019-12-07 DIAGNOSIS — M1710 Unilateral primary osteoarthritis, unspecified knee: Secondary | ICD-10-CM | POA: Diagnosis present

## 2019-12-07 DIAGNOSIS — I480 Paroxysmal atrial fibrillation: Secondary | ICD-10-CM | POA: Diagnosis not present

## 2019-12-07 DIAGNOSIS — G4733 Obstructive sleep apnea (adult) (pediatric): Secondary | ICD-10-CM | POA: Diagnosis present

## 2019-12-07 DIAGNOSIS — Z66 Do not resuscitate: Secondary | ICD-10-CM | POA: Diagnosis not present

## 2019-12-07 DIAGNOSIS — E872 Acidosis: Secondary | ICD-10-CM | POA: Diagnosis present

## 2019-12-07 DIAGNOSIS — J432 Centrilobular emphysema: Secondary | ICD-10-CM | POA: Diagnosis not present

## 2019-12-07 DIAGNOSIS — I25118 Atherosclerotic heart disease of native coronary artery with other forms of angina pectoris: Secondary | ICD-10-CM | POA: Diagnosis not present

## 2019-12-07 DIAGNOSIS — I252 Old myocardial infarction: Secondary | ICD-10-CM

## 2019-12-07 DIAGNOSIS — I2510 Atherosclerotic heart disease of native coronary artery without angina pectoris: Secondary | ICD-10-CM | POA: Diagnosis present

## 2019-12-07 DIAGNOSIS — I6521 Occlusion and stenosis of right carotid artery: Secondary | ICD-10-CM

## 2019-12-07 DIAGNOSIS — Z5320 Procedure and treatment not carried out because of patient's decision for unspecified reasons: Secondary | ICD-10-CM | POA: Diagnosis not present

## 2019-12-07 DIAGNOSIS — I10 Essential (primary) hypertension: Secondary | ICD-10-CM

## 2019-12-07 DIAGNOSIS — Z96653 Presence of artificial knee joint, bilateral: Secondary | ICD-10-CM | POA: Diagnosis present

## 2019-12-07 DIAGNOSIS — Z833 Family history of diabetes mellitus: Secondary | ICD-10-CM

## 2019-12-07 DIAGNOSIS — J9622 Acute and chronic respiratory failure with hypercapnia: Secondary | ICD-10-CM | POA: Diagnosis present

## 2019-12-07 DIAGNOSIS — Z7902 Long term (current) use of antithrombotics/antiplatelets: Secondary | ICD-10-CM

## 2019-12-07 DIAGNOSIS — R0602 Shortness of breath: Secondary | ICD-10-CM

## 2019-12-07 DIAGNOSIS — R0902 Hypoxemia: Secondary | ICD-10-CM | POA: Diagnosis not present

## 2019-12-07 DIAGNOSIS — Z951 Presence of aortocoronary bypass graft: Secondary | ICD-10-CM

## 2019-12-07 DIAGNOSIS — F17210 Nicotine dependence, cigarettes, uncomplicated: Secondary | ICD-10-CM | POA: Diagnosis present

## 2019-12-07 DIAGNOSIS — Z79891 Long term (current) use of opiate analgesic: Secondary | ICD-10-CM

## 2019-12-07 DIAGNOSIS — I471 Supraventricular tachycardia: Secondary | ICD-10-CM | POA: Diagnosis present

## 2019-12-07 DIAGNOSIS — N1832 Chronic kidney disease, stage 3b: Secondary | ICD-10-CM | POA: Diagnosis present

## 2019-12-07 DIAGNOSIS — I5031 Acute diastolic (congestive) heart failure: Secondary | ICD-10-CM | POA: Diagnosis not present

## 2019-12-07 DIAGNOSIS — E11649 Type 2 diabetes mellitus with hypoglycemia without coma: Secondary | ICD-10-CM | POA: Diagnosis not present

## 2019-12-07 DIAGNOSIS — I130 Hypertensive heart and chronic kidney disease with heart failure and stage 1 through stage 4 chronic kidney disease, or unspecified chronic kidney disease: Principal | ICD-10-CM | POA: Diagnosis present

## 2019-12-07 DIAGNOSIS — Z811 Family history of alcohol abuse and dependence: Secondary | ICD-10-CM

## 2019-12-07 DIAGNOSIS — Z515 Encounter for palliative care: Secondary | ICD-10-CM | POA: Diagnosis not present

## 2019-12-07 DIAGNOSIS — J90 Pleural effusion, not elsewhere classified: Secondary | ICD-10-CM | POA: Diagnosis not present

## 2019-12-07 DIAGNOSIS — J449 Chronic obstructive pulmonary disease, unspecified: Secondary | ICD-10-CM | POA: Diagnosis not present

## 2019-12-07 DIAGNOSIS — I200 Unstable angina: Secondary | ICD-10-CM

## 2019-12-07 DIAGNOSIS — Z79899 Other long term (current) drug therapy: Secondary | ICD-10-CM

## 2019-12-07 DIAGNOSIS — Z807 Family history of other malignant neoplasms of lymphoid, hematopoietic and related tissues: Secondary | ICD-10-CM

## 2019-12-07 DIAGNOSIS — Z7984 Long term (current) use of oral hypoglycemic drugs: Secondary | ICD-10-CM

## 2019-12-07 LAB — BRAIN NATRIURETIC PEPTIDE: B Natriuretic Peptide: 475 pg/mL — ABNORMAL HIGH (ref 0.0–100.0)

## 2019-12-07 LAB — BLOOD GAS, ARTERIAL
Acid-Base Excess: 7.9 mmol/L — ABNORMAL HIGH (ref 0.0–2.0)
Bicarbonate: 38.1 mmol/L — ABNORMAL HIGH (ref 20.0–28.0)
Delivery systems: POSITIVE
Expiratory PAP: 8
FIO2: 0.45
Inspiratory PAP: 16
O2 Saturation: 90.9 %
Patient temperature: 37
pCO2 arterial: 83 mmHg (ref 32.0–48.0)
pH, Arterial: 7.27 — ABNORMAL LOW (ref 7.350–7.450)
pO2, Arterial: 69 mmHg — ABNORMAL LOW (ref 83.0–108.0)

## 2019-12-07 LAB — CBC WITH DIFFERENTIAL/PLATELET
Abs Immature Granulocytes: 0.04 10*3/uL (ref 0.00–0.07)
Basophils Absolute: 0.1 10*3/uL (ref 0.0–0.1)
Basophils Relative: 1 %
Eosinophils Absolute: 0 10*3/uL (ref 0.0–0.5)
Eosinophils Relative: 0 %
HCT: 37.1 % — ABNORMAL LOW (ref 39.0–52.0)
Hemoglobin: 10.6 g/dL — ABNORMAL LOW (ref 13.0–17.0)
Immature Granulocytes: 0 %
Lymphocytes Relative: 13 %
Lymphs Abs: 1.2 10*3/uL (ref 0.7–4.0)
MCH: 21.8 pg — ABNORMAL LOW (ref 26.0–34.0)
MCHC: 28.6 g/dL — ABNORMAL LOW (ref 30.0–36.0)
MCV: 76.3 fL — ABNORMAL LOW (ref 80.0–100.0)
Monocytes Absolute: 1 10*3/uL (ref 0.1–1.0)
Monocytes Relative: 10 %
Neutro Abs: 7 10*3/uL (ref 1.7–7.7)
Neutrophils Relative %: 76 %
Platelets: 322 10*3/uL (ref 150–400)
RBC: 4.86 MIL/uL (ref 4.22–5.81)
RDW: 19.8 % — ABNORMAL HIGH (ref 11.5–15.5)
WBC: 9.3 10*3/uL (ref 4.0–10.5)
nRBC: 0.5 % — ABNORMAL HIGH (ref 0.0–0.2)

## 2019-12-07 LAB — COMPREHENSIVE METABOLIC PANEL
ALT: 22 U/L (ref 0–44)
AST: 19 U/L (ref 15–41)
Albumin: 3.8 g/dL (ref 3.5–5.0)
Alkaline Phosphatase: 25 U/L — ABNORMAL LOW (ref 38–126)
Anion gap: 11 (ref 5–15)
BUN: 38 mg/dL — ABNORMAL HIGH (ref 8–23)
CO2: 34 mmol/L — ABNORMAL HIGH (ref 22–32)
Calcium: 9.1 mg/dL (ref 8.9–10.3)
Chloride: 91 mmol/L — ABNORMAL LOW (ref 98–111)
Creatinine, Ser: 1.92 mg/dL — ABNORMAL HIGH (ref 0.61–1.24)
GFR calc Af Amer: 40 mL/min — ABNORMAL LOW (ref >60)
GFR calc non Af Amer: 35 mL/min — ABNORMAL LOW (ref >60)
Glucose, Bld: 114 mg/dL — ABNORMAL HIGH (ref 70–99)
Potassium: 4.6 mmol/L (ref 3.5–5.1)
Sodium: 136 mmol/L (ref 135–145)
Total Bilirubin: 1 mg/dL (ref 0.3–1.2)
Total Protein: 7.2 g/dL (ref 6.5–8.1)

## 2019-12-07 LAB — BLOOD GAS, VENOUS
Acid-Base Excess: 8.6 mmol/L — ABNORMAL HIGH (ref 0.0–2.0)
Bicarbonate: 39 mmol/L — ABNORMAL HIGH (ref 20.0–28.0)
O2 Saturation: 63.4 %
Patient temperature: 37
pCO2, Ven: 85 mmHg (ref 44.0–60.0)
pH, Ven: 7.27 (ref 7.250–7.430)
pO2, Ven: 38 mmHg (ref 32.0–45.0)

## 2019-12-07 LAB — TROPONIN I (HIGH SENSITIVITY)
Troponin I (High Sensitivity): 11 ng/L (ref <18)
Troponin I (High Sensitivity): 12 ng/L (ref <18)

## 2019-12-07 LAB — GLUCOSE, CAPILLARY
Glucose-Capillary: 90 mg/dL (ref 70–99)
Glucose-Capillary: 147 mg/dL — ABNORMAL HIGH (ref 70–99)

## 2019-12-07 LAB — SARS CORONAVIRUS 2 BY RT PCR (HOSPITAL ORDER, PERFORMED IN ~~LOC~~ HOSPITAL LAB): SARS Coronavirus 2: NEGATIVE

## 2019-12-07 LAB — PROTIME-INR
INR: 1.2 (ref 0.8–1.2)
Prothrombin Time: 14.7 seconds (ref 11.4–15.2)

## 2019-12-07 MED ORDER — ALPRAZOLAM 0.25 MG PO TABS
0.2500 mg | ORAL_TABLET | Freq: Two times a day (BID) | ORAL | Status: DC | PRN
  Administered 2019-12-08 – 2019-12-09 (×2): 0.25 mg via ORAL
  Filled 2019-12-07 (×2): qty 1

## 2019-12-07 MED ORDER — CLOPIDOGREL BISULFATE 75 MG PO TABS
75.0000 mg | ORAL_TABLET | Freq: Every day | ORAL | Status: DC
  Administered 2019-12-08 – 2019-12-14 (×7): 75 mg via ORAL
  Filled 2019-12-07 (×8): qty 1

## 2019-12-07 MED ORDER — ATORVASTATIN CALCIUM 20 MG PO TABS
80.0000 mg | ORAL_TABLET | Freq: Every day | ORAL | Status: DC
  Administered 2019-12-08 – 2019-12-14 (×7): 80 mg via ORAL
  Filled 2019-12-07 (×8): qty 4

## 2019-12-07 MED ORDER — IPRATROPIUM-ALBUTEROL 0.5-2.5 (3) MG/3ML IN SOLN
3.0000 mL | Freq: Four times a day (QID) | RESPIRATORY_TRACT | Status: DC
  Administered 2019-12-08 (×3): 3 mL via RESPIRATORY_TRACT
  Filled 2019-12-07 (×3): qty 3

## 2019-12-07 MED ORDER — ONDANSETRON HCL 4 MG/2ML IJ SOLN: 4.0000 mg | Freq: Four times a day (QID) | INTRAMUSCULAR | Status: DC | PRN

## 2019-12-07 MED ORDER — SODIUM CHLORIDE 0.9% FLUSH: 3.0000 mL | INTRAVENOUS | Status: DC | PRN

## 2019-12-07 MED ORDER — AMLODIPINE BESYLATE 10 MG PO TABS
10.0000 mg | ORAL_TABLET | Freq: Every day | ORAL | Status: DC
  Administered 2019-12-08 – 2019-12-14 (×7): 10 mg via ORAL
  Filled 2019-12-07 (×4): qty 1
  Filled 2019-12-07: qty 2
  Filled 2019-12-07 (×3): qty 1

## 2019-12-07 MED ORDER — INSULIN ASPART 100 UNIT/ML ~~LOC~~ SOLN
0.0000 [IU] | Freq: Three times a day (TID) | SUBCUTANEOUS | Status: DC
  Administered 2019-12-08: 4 [IU] via SUBCUTANEOUS
  Administered 2019-12-08: 7 [IU] via SUBCUTANEOUS
  Filled 2019-12-07 (×2): qty 1

## 2019-12-07 MED ORDER — SODIUM CHLORIDE 0.9 % IV SOLN
500.0000 mg | Freq: Once | INTRAVENOUS | Status: AC
  Administered 2019-12-07: 500 mg via INTRAVENOUS
  Filled 2019-12-07: qty 500

## 2019-12-07 MED ORDER — METHYLPREDNISOLONE SODIUM SUCC 125 MG IJ SOLR
125.0000 mg | Freq: Once | INTRAMUSCULAR | Status: AC
  Administered 2019-12-07: 125 mg via INTRAVENOUS
  Filled 2019-12-07: qty 2

## 2019-12-07 MED ORDER — FUROSEMIDE 10 MG/ML IJ SOLN
40.0000 mg | Freq: Two times a day (BID) | INTRAMUSCULAR | Status: DC
  Administered 2019-12-07 – 2019-12-09 (×4): 40 mg via INTRAVENOUS
  Filled 2019-12-07 (×4): qty 4

## 2019-12-07 MED ORDER — SPIRONOLACTONE 25 MG PO TABS
25.0000 mg | ORAL_TABLET | Freq: Every day | ORAL | Status: DC
  Administered 2019-12-08 – 2019-12-14 (×7): 25 mg via ORAL
  Filled 2019-12-07 (×7): qty 1

## 2019-12-07 MED ORDER — METOPROLOL TARTRATE 25 MG PO TABS
25.0000 mg | ORAL_TABLET | Freq: Two times a day (BID) | ORAL | Status: DC
  Administered 2019-12-07 – 2019-12-14 (×14): 25 mg via ORAL
  Filled 2019-12-07 (×15): qty 1

## 2019-12-07 MED ORDER — SODIUM CHLORIDE 0.9% FLUSH
3.0000 mL | Freq: Two times a day (BID) | INTRAVENOUS | Status: DC
  Administered 2019-12-07 – 2019-12-14 (×14): 3 mL via INTRAVENOUS

## 2019-12-07 MED ORDER — METHYLPREDNISOLONE SODIUM SUCC 125 MG IJ SOLR
60.0000 mg | Freq: Every day | INTRAMUSCULAR | Status: DC
  Administered 2019-12-08: 60 mg via INTRAVENOUS
  Filled 2019-12-07: qty 2

## 2019-12-07 MED ORDER — SODIUM CHLORIDE 0.9 % IV SOLN: 250.0000 mL | INTRAVENOUS | Status: DC | PRN

## 2019-12-07 MED ORDER — IPRATROPIUM-ALBUTEROL 0.5-2.5 (3) MG/3ML IN SOLN
3.0000 mL | Freq: Once | RESPIRATORY_TRACT | Status: AC
  Administered 2019-12-07: 3 mL via RESPIRATORY_TRACT
  Filled 2019-12-07: qty 3

## 2019-12-07 MED ORDER — ENOXAPARIN SODIUM 40 MG/0.4ML ~~LOC~~ SOLN
40.0000 mg | SUBCUTANEOUS | Status: DC
  Administered 2019-12-07 – 2019-12-11 (×5): 40 mg via SUBCUTANEOUS
  Filled 2019-12-07 (×4): qty 0.4

## 2019-12-07 MED ORDER — ASPIRIN EC 81 MG PO TBEC
81.0000 mg | DELAYED_RELEASE_TABLET | Freq: Every day | ORAL | Status: DC
  Administered 2019-12-08 – 2019-12-14 (×7): 81 mg via ORAL
  Filled 2019-12-07 (×8): qty 1

## 2019-12-07 MED ORDER — INSULIN ASPART 100 UNIT/ML ~~LOC~~ SOLN: 6.0000 [IU] | Freq: Three times a day (TID) | SUBCUTANEOUS | Status: DC

## 2019-12-07 MED ORDER — ACETAMINOPHEN 500 MG PO TABS: 1000.0000 mg | ORAL_TABLET | Freq: Three times a day (TID) | ORAL | Status: DC | PRN

## 2019-12-07 MED ORDER — FUROSEMIDE 10 MG/ML IJ SOLN
40.0000 mg | Freq: Once | INTRAMUSCULAR | Status: AC
  Administered 2019-12-07: 40 mg via INTRAVENOUS
  Filled 2019-12-07: qty 4

## 2019-12-07 MED ORDER — NICOTINE 21 MG/24HR TD PT24: 21.0000 mg | MEDICATED_PATCH | Freq: Every day | TRANSDERMAL | Status: DC | PRN

## 2019-12-07 MED ORDER — PANTOPRAZOLE SODIUM 40 MG PO TBEC
40.0000 mg | DELAYED_RELEASE_TABLET | Freq: Every day | ORAL | Status: DC
  Administered 2019-12-08 – 2019-12-14 (×7): 40 mg via ORAL
  Filled 2019-12-07 (×7): qty 1

## 2019-12-07 MED ORDER — TRAZODONE HCL 50 MG PO TABS
50.0000 mg | ORAL_TABLET | Freq: Every evening | ORAL | Status: DC | PRN
  Administered 2019-12-08: 50 mg via ORAL
  Filled 2019-12-07: qty 1

## 2019-12-07 MED ORDER — FENOFIBRATE 160 MG PO TABS
160.0000 mg | ORAL_TABLET | Freq: Every day | ORAL | Status: DC
  Administered 2019-12-08 – 2019-12-14 (×7): 160 mg via ORAL
  Filled 2019-12-07 (×8): qty 1

## 2019-12-07 NOTE — Progress Notes (Addendum)
Office Visit    Patient Name: Joshua Roy Date of Encounter: 12/07/2019  Primary Care Provider:  [name], MD Primary Cardiologist:  Ida Rogue, MD  Chief Complaint    69 y/o ? w/ a h/o CAD s/p recent 4 vessel CABG, carotid dzs s/p recent R CEA, post-op Afib, HTN, HL, DMII, tob abuse, COPD, obesity, CKD III, HFpEF, and PSVT, who presents for f/u related to CAD and heart failure.  Past Medical History    Past Medical History:  Diagnosis Date  . (HFpEF) heart failure with preserved ejection fraction ([place])    a. 07/2019 Echo: EF 60-65%. Mod LVH. Gr1 DD. No rwma. Nl RV size/fxn. Mildly dil LA.  Marland Kitchen ACE-inhibitor cough   . Anxiety   . Bronchitis 06/27/2016  . Carotid arterial disease ([place])    a. 09/2019 Carotid U/S: RICA 25-85%, LICA 2-77%; b. 02/2422 R CEA.  . CKD (chronic kidney disease), stage III   . COPD (chronic obstructive pulmonary disease) (H[facility])    cath, mild inf. hypokinesis EF 49%, 100% ROA  . Coronary artery disease    a. 2001 Cath: LM 10, LAD 60p/m, D1 30, LCX 20p, RCA 12m/d-->Med Rx; b. 05/2017 Ex Mv: Ex time 5:46, antlat twi @ rest, fixed infarct w/ mild peri-infarct ischemia. EF 38%; c. 08/2019 Cath: LM 40ost, 70d, LAD 56m, LCX 40m, RCA 80-90p, 154m; d. 09/2019 CABG x4: LIMA->LAD, VG->RI, VG->OM, VG->RPDA.  . Diabetes mellitus type II   . Hyperlipidemia   . Hypertension   . MI (myocardial infarction) ([place]) age 55  . OA (osteoarthritis)    knee OA, injected 2012 by ortho  . PAF (paroxysmal atrial fibrillation) ([place])    a. 09/2019 post-op CABG-->Amio.  . Pneumonia   . PSVT (paroxysmal supraventricular tachycardia) ([place])    a. 08/2019 Zio: 189 brief runs of SVT (fastest 174 x 6 beats, longest 15 beats @ 107).  . Tobacco abuse    Past Surgical History:  Procedure Laterality Date  . CARDIAC CATHETERIZATION  05/06/2000   @ [facility]  . COLONOSCOPY WITH PROPOFOL N/A 04/20/2018   Procedure: COLONOSCOPY WITH PROPOFOL;  Surgeon: [name], MD;  Location:  [facility] ENDOSCOPY;  Service: Endoscopy;  Laterality: N/A;  . CORONARY ARTERY BYPASS GRAFT N/A 10/07/2019   Procedure: CORONARY ARTERY BYPASS GRAFTING (CABG) using LIMA to LAD; Endoscopic harvesting of left greater saphenous vein: SVG to RAMUS; SVG to OM1; SVG to PDA.;  Surgeon: Gaye Pollack, MD;  Location: [facility];  Service: Open Heart Surgery;  Laterality: N/A;  . ENDARTERECTOMY Right 10/07/2019   Procedure: ENDARTERECTOMY CAROTID;  Surgeon: Rosetta Posner, MD;  Location: [facility] OR;  Service: Vascular;  Laterality: Right;  . ENDOVEIN HARVEST OF GREATER SAPHENOUS VEIN Left 10/07/2019   Procedure: [place] Of Greater Saphenous Vein;  Surgeon: Gaye Pollack, MD;  Location: [facility] OR;  Service: Open Heart Surgery;  Laterality: Left;  . ESOPHAGOGASTRODUODENOSCOPY ENDOSCOPY    . KNEE ARTHROSCOPY  07/25/04   left  . [place]   right, open surgery- repair  . RIGHT/LEFT HEART CATH AND CORONARY ANGIOGRAPHY Bilateral 09/08/2019   Procedure: RIGHT/LEFT HEART CATH AND CORONARY ANGIOGRAPHY;  Surgeon: Minna Merritts, MD;  Location: [facility] CV LAB;  Service: Cardiovascular;  Laterality: Bilateral;  . TEE WITHOUT CARDIOVERSION N/A 10/07/2019   Procedure: TRANSESOPHAGEAL ECHOCARDIOGRAM (TEE);  Surgeon: Gaye Pollack, MD;  Location: [facility];  Service: Open Heart Surgery;  Laterality: N/A;  . TOTAL KNEE ARTHROPLASTY Left 07/14/2016  Procedure: LEFT TOTAL KNEE ARTHROPLASTY;  Surgeon: Gaynelle Arabian, MD;  Location: [facility] ORS;  Service: Orthopedics;  Laterality: Left;  . TOTAL KNEE ARTHROPLASTY Right 07/13/2017   Procedure: RIGHT TOTAL KNEE ARTHROPLASTY;  Surgeon: Gaynelle Arabian, MD;  Location: [facility] ORS;  Service: Orthopedics;  Laterality: Right;    Allergies  Allergies  Allergen Reactions  . Ramipril Cough  . Mucinex D [Pseudoephedrine-Guaifenesin Er] Other (See Comments) and Hypertension    Elevated BP, insomnia    History of Present Illness    69 y/o ? w/ a h/o CAD s/p recent 4 vessel CABG,  carotid dzs s/p recent R CEA, post-op Afib, HTN, HL, DMII, tob abuse, COPD, obesity, CKD III, HFpEF, and PSVT.  He initially underwent cath in 2001, revealing an occluded mid/distal RCA, and was medically managed.  In 07/2019, he had a prolonged episode of presyncope followed by a brief episode of syncope, for which he was seen in the [facility] ED, but left AMA.  He subsequently noted progressive wt gain, DOE, lower extremity and scrotal edema, and orthopnea. He was placed on lasix and had significant wt loss. Echo showed nl EF w/ grade 1 diast dysfxn and no significant valvular dzs. He underwent diagnostic cath revealing severe, multivessel CAD and underwent CABG x 4 in March.  Preop carotid dopplers revealed severe RICA dzs and he also underwent R CEA @ the time of his CABG.  Post-op course was minimally complicated by Afib, which was successfully managed w/ amiodarone, which has since been reduced to 200mg  daily.    He was last seen in cardiology clinic on 3/30, followed by CT surgery visit on 4/7.  He says that ever since his bypass surgery, he has had easy fatigability.  He has not been particularly active and though he previously had planned to pursue virtual cardiac rehab, this has not occurred yet.  He has had trouble sleeping and is pending a sleep study scheduled for tomorrow night.  He has a pulse oximeter at home and over the past 10 to 14 days, he has noticed readings in the 70s.  Over the same period of time, he has been experiencing increasing dyspnea on exertion, lower extremity and abdominal swelling, orthopnea, early satiety, and 14 pound weight gain.  He reports compliance with medications and does not think he is any more noncompliant with salt intake than he ever was previously.  He continues to smoke cigarettes.  In the setting of progressive symptoms and weight gain, his home Lasix dose was increased from 40 mg daily to 40 mg in the morning and 20 mg in the afternoon by his primary care provider.   As symptoms progressed, patient has been taking 40 mg daily and again at at bedtime resulting in significant nocturia.  Today, patient needed to be wheeled over to the office because of dyspnea on exertion.  Oxygen saturation on arrival was 68% on room air and fluctuated between 68 and 78% during the visit.  He has not been having any chest pain.  Home Medications    Prior to Admission medications   Medication Sig Start Date End Date Taking? Authorizing Provider  acetaminophen (TYLENOL) 500 MG tablet Take 1,000 mg by mouth every 8 (eight) hours as needed for mild pain or headache.    [provider]  ALPRAZolam Duanne Moron) 0.25 MG tablet Take 0.25 mg by mouth 2 (two) times daily as needed for anxiety.  08/03/19   [provider]  amLODipine (NORVASC) 10 MG tablet Take 1  tablet (10 mg total) by mouth daily. 10/26/19   Loel Dubonnet, NP  aspirin EC 81 MG tablet Take 1 tablet (81 mg total) by mouth daily. 10/14/19   Gold, Wayne E, PA-C  atorvastatin (LIPITOR) 80 MG tablet Take 1 tablet (80 mg total) by mouth daily. 11/21/14   Tonia Ghent, MD  clopidogrel (PLAVIX) 75 MG tablet Take 75 mg by mouth daily.    [provider]  fenofibrate 160 MG tablet Take 1 tablet (160 mg total) by mouth daily. 01/11/15   Tonia Ghent, MD  furosemide (LASIX) 40 MG tablet Take 1 tablet (40 mg total) by mouth daily. 10/14/19   Gold, Wilder Glade, PA-C  glimepiride (AMARYL) 2 MG tablet Take one half tablet (1 mg) by mouth each morning Patient taking differently: Take 2 mg by mouth daily with breakfast.  01/11/15   Tonia Ghent, MD  glucose blood (ACCU-CHEK AVIVA PLUS) test strip Use to check blood sugar once daily or as needed.  Diagnosis:  E11.9   Non insulin-dependent. 12/14/14   Tonia Ghent, MD  Lancets (ACCU-CHEK MULTICLIX) lancets Use as instructed to check blood sugar once daily or as needed.  Diagnosis:  E11.9   Non insulin-dependent. 12/14/14   Tonia Ghent, MD  metFORMIN  (GLUCOPHAGE) 1000 MG tablet Take 1 tablet (1,000 mg total) by mouth 2 (two) times daily. 01/11/15   Tonia Ghent, MD  metoprolol tartrate (LOPRESSOR) 25 MG tablet Take 1 tablet (25 mg total) by mouth 2 (two) times daily. 01/11/15   Tonia Ghent, MD  nicotine (NICODERM CQ - DOSED IN MG/24 HOURS) 21 mg/24hr patch Place 21 mg onto the skin daily as needed (nicotine dependence).     [provider]  pantoprazole (PROTONIX) 40 MG tablet Take 40 mg by mouth daily before breakfast. 09/01/19   [provider]  spironolactone (ALDACTONE) 25 MG tablet Take 1 tablet (25 mg total) by mouth daily. 10/17/19 01/15/20  Bensimhon, Shaune Pascal, MD  traMADol (ULTRAM) 50 MG tablet Take 1 tablet (50 mg total) by mouth every 6 (six) hours as needed for moderate pain. 11/02/19   Gaye Pollack, MD  traZODone (DESYREL) 50 MG tablet Take 1 tablet (50 mg total) by mouth at bedtime as needed for up to 14 doses for sleep. 11/14/19   Gaye Pollack, MD   Family History    Family History  Problem Relation Age of Onset  . Heart disease Father        CAD, MI x 3  . Hypertension Father   . Alcohol abuse Father   . Diabetes Father   . Diabetes Sister   . Cancer Brother        lymphoma, in remission  . Colon cancer Brother   . Heart disease Sister        CABG x 3  . Prostate cancer Neg Hx     Social History    Social History   Socioeconomic History  . Marital status: Married    Spouse name: Not on file  . Number of children: 3  . Years of education: Not on file  . Highest education level: Not on file  Occupational History  . Occupation: Engineer, manufacturing, Estate agent  Tobacco Use  . Smoking status: Former Smoker    Packs/day: 1.00    Years: 40.00    Pack years: 40.00    Quit date: 10/07/2019    Years since quitting: 0.1  . Smokeless  tobacco: Never Used  . Tobacco comment: per patient wears a patch and has cut down on cigarettes/day (10/05/2019)  Substance and Sexual Activity  . Alcohol use:  Yes    Alcohol/week: 0.0 standard drinks    Comment: 5-6 cocktails, 1 times a week  . Drug use: No  . Sexual activity: Not on file  Other Topics Concern  . Not on file  Social History Narrative   From [place].  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   Social Determinants of Health   Financial Resource Strain:   . Difficulty of Paying Living Expenses:   Food Insecurity:   . Worried About Charity fundraiser in the Last Year:   . Arboriculturist in the Last Year:   Transportation Needs:   . Film/video editor (Medical):   Marland Kitchen Lack of Transportation (Non-Medical):   Physical Activity:   . Days of Exercise per Week:   . Minutes of Exercise per Session:   Stress:   . Feeling of Stress :   Social Connections:   . Frequency of Communication with Friends and Family:   . Frequency of Social Gatherings with Friends and Family:   . Attends Religious Services:   . Active Member of Clubs or Organizations:   . Attends Archivist Meetings:   Marland Kitchen Marital Status:   Intimate Partner Violence:   . Fear of Current or Ex-Partner:   . Emotionally Abused:   Marland Kitchen Physically Abused:   . Sexually Abused:     Review of Systems    ~ 2 wk h/o progressive DOE, LEE, inc abd girth, orthopnea, weight gain and early satiety.  He denies chest pain, palpitations, n, v, dizziness, syncope.   All other systems reviewed and are otherwise negative except as noted above.  Physical Exam    VS:  BP (!) 106/52 (BP Location: Left Arm, Patient Position: Sitting, Cuff Size: Normal)   Pulse 67   Ht 5\' 10"  (1.778 m)   Wt 264 lb 8 oz (120 kg)   SpO2 (!) 68%   BMI 37.95 kg/m  , BMI Body mass index is 37.95 kg/m. O2 sats 68-82% on room air. GEN: Obese, no acute distress. HEENT: normal. Neck: Supple, JVD to jaw, no carotid bruits, or masses. Cardiac: RRR, no murmurs, rubs, or gallops. No clubbing, cyanosis.  3+ bilat LE edema to posterior thights.  Radials/PT 1+ and equal bilaterally.  Respiratory:   Respirations regular and unlabored, crackles 1/2 up bilaterally. GI: Obese, firm, edematous, nontender, BS + x 4. MS: no deformity or atrophy. Skin: warm and dry, no rash. Neuro:  Strength and sensation are intact. Psych: Normal affect.  Accessory Clinical Findings    ECG personally reviewed by me today - RSR, 67, right axis deviation, nonspecific t changes, baseline artifact - no acute changes.  Lab Results  Component Value Date   WBC 9.3 12/07/2019   HGB 10.6 (L) 12/07/2019   HCT 37.1 (L) 12/07/2019   MCV 76.3 (L) 12/07/2019   PLT 322 12/07/2019   Lab Results  Component Value Date   CREATININE 1.60 (H) 10/25/2019   BUN 16 10/25/2019   NA 136 10/25/2019   K 4.5 10/25/2019   CL 94 (L) 10/25/2019   CO2 26 10/25/2019   Lab Results  Component Value Date   ALT 15 10/05/2019   AST 17 10/05/2019   ALKPHOS 35 (L) 10/05/2019   BILITOT 0.7 10/05/2019   Lab Results  Component Value Date  CHOL 84 10/11/2019   HDL 26 (L) 10/11/2019   LDLCALC 39 10/11/2019   LDLDIRECT 109.0 11/15/2014   TRIG 93 10/11/2019   CHOLHDL 3.2 10/11/2019    Lab Results  Component Value Date   HGBA1C 8.2 (H) 10/05/2019    Assessment & Plan    1.  Acute on chronic heart failure with preserved ejection fraction/acute on chronic hypoxic respiratory failure: Patient presented in January of this year with progressive heart failure symptoms and echocardiogram at that time showed normal LV function with an EF of 60-65% and grade 1 diastolic dysfunction.  Following CABG x4 in March, volume status had been stable but over the past 10 to 14 days, he has been noticing increasing lower extremity edema, increasing abdominal girth, orthopnea, dyspnea on exertion, and early satiety.  His weight is up 14 pounds since his April 7 CT surgical visit.  He is markedly volume overloaded on examination with crackles halfway up and significant lower extremity edema extending to the posterior thighs and abdomen.  This has all  occurred despite escalation of outpatient Lasix over the past 2 weeks from 40 mg daily to 40 mg twice daily.  O2 sats in the office have ranged frmo 68-78% on room air.  In this setting, I have recommended ER evaluation and admission for IV diuresis and repeat echocardiography.  Though he has not had any chest pain, we may need to consider repeat catheterization to assess graft anatomy, as he did not have any chest pain prior to CABG either.  Recommend continuation of aspirin, Plavix, beta-blocker, and spironolactone.  He was previously on ARB therapy but this appears to have been discontinued in the setting of renal insufficiency.  Of note, pt refused direct transfer to the ED and instead wished to go out and get something for lunch at [place] but promised to return to the emergency department later.  The risks of ongoing hypoxia and heart failure were explained in detail and patient verbalized understanding.  2.  Coronary artery disease: Status post four-vessel bypass in March of this year.  He has not been having any chest pain but has had return of heart failure symptoms.  Follow-up echocardiogram.  As above, may need to consider relook catheterization.  Continue aspirin, statin, Plavix, and beta-blocker therapy.  3.  Essential hypertension: Stable today.  4.  Hyperlipidemia: LDL of 39 in March of this year with normal LFTs at that time.  5.  Carotid arterial disease: Status post right carotid endarterectomy at the time of his bypass surgery in March.  Followed by vascular surgery in [place].  Continue aspirin, Plavix, and statin therapy.  6.  Postoperative atrial fibrillation: Maintaining sinus rhythm.  Amiodarone recently discontinued by primary care provider.  Continue beta-blocker therapy.  He is not on oral anticoagulation.  7.  Stage III chronic kidney disease: Follow-up labs in the ED.  8.  Type 2 diabetes mellitus: A1c 8.2 in March.  He is on Amaryl and Metformin and has been  adjusting his medicines in the setting of early morning hypoglycemia.  9.  Tobacco abuse/COPD: Still smoking.  Cessation advised.  Certainly contributing to hypoxia.  10.  Disposition: Patient to present to the emergency department for evaluation, admission, and IV diuresis.  Murray Hodgkins, NP 12/07/2019, 12:51 PM

## 2019-12-07 NOTE — Progress Notes (Addendum)
    BRIEF OVERNIGHT PROGRESS REPORT   SUBJECTIVE: Per patient's RN, patient noted with increased work of breathing, shortness of breath and worsening somnolent.  OBJECTIVE: Patient was assessed at the bedside, he was afebrile with blood pressure 224/114 mm Hg and pulse rate 81 beats/min.  Patient responds only to noxious stimuli, he will localize bilateral upper end withdrawal bilateral lower extremity to noxious stimuli but will not open his eyes to sternal rub.  BRIEF PATIENT DESCRIPTION: 69 year old male admitted 12/07/2019 with acute hypoxic hypercapnic respiratory failure secondary to acute on chronic HFpEF and presumed Obstructive Sleep Apnea.  Patient also with COPD, without evidence of acute exacerbation.  Late in the evening on 5/12 became increasingly somnolent, ABG with worsening hypercapnia.  Transferred to stepdown unit for BiPAP.  ASSESSMENT/PLAN:  1. Acute hypercarbic/hypoxic respiratory failure secondary to Acute on chronic CHF exacerbation. Acute presentation likely due to volume overload with associated symptoms of SOB, BLE edema and. BNP mildly elevated at 475 - Transfer to stepdown - Continue Bipap with rate titration due to hypercarbia - ABGs shows worsening hypercapnia - Repeat Chest x-ray shows stable pulmonary vascular congestion    Last Echo EF 60-65% - Continue Metoprolol - Furosemide 40mg  IV. Diureses >1L negative per day until approach euvolemia / worsening renal function. - Low salt diet  - Strict I&Os - Cardiology Consult - Consult to Intensivist  2. Acute Encephalopathy - Likely due to hypercarbia as above - Continue management of underlying cause as above  3. COPD without evidence of Exacerbation - Bipap as above - IV steroids - Bronchodilators Q4 hours     Rufina Falco, DNP, CCRN, [facility] Nurse Practitioner

## 2019-12-07 NOTE — Addendum Note (Signed)
Addended by: Murray Hodgkins R on: 12/07/2019 12:52 PM   Modules accepted: Orders

## 2019-12-07 NOTE — ED Notes (Addendum)
MD at bedside, O2 adjusted to 6L [place]

## 2019-12-07 NOTE — H&P (Signed)
History and Physical    Joshua Roy QJJ:[id number] DOB: July 05, 1951 DOA: 12/07/2019  PCP: [name], MD   Patient coming from: Home  I have personally briefly reviewed patient's old medical records in [place]  Chief Complaint: Shortness of breath  HPI: Joshua Roy is a 69 y.o. male with medical history significant for coronary artery disease status post recent four-vessel CABG, carotid disease status post recent carotid endarterectomy, history of A. 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 Patient complains of easy fatigability as well as trouble sleeping.  He also complains of dyspnea with exertion, bilateral lower extremity swelling, increased abdominal girth, orthopnea and a 14 pound weight gain since his last hospitalization.  He reports compliance with his medications but does not think he has been compliant with his diet.  He continues to smoke cigarettes.  Patient has had an increase in the dose of his diuretic from 40 mg daily to 40 mg in the morning and 20 in the afternoon by his primary care provider because of his significant weight gain. While in the office he was noted to have a pulse oximetry of 68% on room air, patient is not on home oxygen.  He was sent to the emergency room for further evaluation. Patient was seen in the emergency room and was placed on BiPAP due to increased work of breathing and hypoxia.  He was initially on nasal cannula but noted to have drop in his pulse oximetry when he fell asleep to high 70s. Chest x-ray showed mild vascular congestion and small left effusion. Negative for edema. Twelve-lead EKG showed sinus rhythm with PVCs.  Troponin was elevated at 475. He denies having any chest pain, no nausea, no vomiting, no palpitations, no diaphoresis, no  cough, no fever, no chills or changes in his bowel habits.  ED Course: Patient seen in the emergency room for evaluation of shortness of breath, orthopnea, increased abdominal girth and lower extremity swelling.  BNP was elevated and chest x-ray is consistent with CHF.  Patient was hypoxic in the field and given in the emergency room was noted to desaturate every time he fell asleep resulting in a high suspicion for possible sleep apnea.  Arterial blood gas revealed hypercarbia and uncompensated respiratory acidosis.  Patient is currently on BiPAP.  Review of Systems: As per HPI otherwise 10 point review of systems negative.    Past Medical History:  Diagnosis Date  . (HFpEF) heart failure with preserved ejection fraction ([place])    a. 07/2019 Echo: EF 60-65%. Mod LVH. Gr1 DD. No rwma. Nl RV size/fxn. Mildly dil LA.  Marland Kitchen ACE-inhibitor cough   . Anxiety   . Bronchitis 06/27/2016  . Carotid arterial disease ([place])    a. 09/2019 Carotid U/S: RICA 48-18%, LICA 5-63%; b. 07/4968 R CEA.  . CKD (chronic kidney disease), stage III   . COPD (chronic obstructive pulmonary disease) (HCC)    cath, mild inf. hypokinesis EF 49%, 100% ROA  . Coronary artery disease    a. 2001 Cath: LM 10, LAD 60p/m, D1 30, LCX 20p, RCA 1107m/d-->Med Rx; b. 05/2017 Ex Mv: Ex time 5:46, antlat twi @ rest, fixed infarct w/ mild peri-infarct ischemia. EF 38%; c. 08/2019 Cath: LM 40ost, 70d, LAD 86m, LCX 8m, RCA 80-90p, 132m; d. 09/2019 CABG x4: LIMA->LAD, VG->RI, VG->OM, VG->RPDA.  Marland Kitchen  Diabetes mellitus type II   . Hyperlipidemia   . Hypertension   . MI (myocardial infarction) ([place]) age 16  . OA (osteoarthritis)    knee OA, injected 2012 by ortho  . PAF (paroxysmal atrial fibrillation) ([place])    a. 09/2019 post-op CABG-->[facility].  . Pneumonia   . PSVT (paroxysmal supraventricular tachycardia) ([place])    a. 08/2019 Zio: 189 brief runs of SVT (fastest 174 x 6 beats, longest 15 beats @ 107).  . Tobacco abuse     Past Surgical History:   Procedure Laterality Date  . CARDIAC CATHETERIZATION  05/06/2000   @ [place]  . COLONOSCOPY WITH PROPOFOL N/A 04/20/2018   Procedure: COLONOSCOPY WITH PROPOFOL;  Surgeon: [name], MD;  Location: [facility] ENDOSCOPY;  Service: Endoscopy;  Laterality: N/A;  . CORONARY ARTERY BYPASS GRAFT N/A 10/07/2019   Procedure: CORONARY ARTERY BYPASS GRAFTING (CABG) using LIMA to LAD; Endoscopic harvesting of left greater saphenous vein: SVG to RAMUS; SVG to OM1; SVG to PDA.;  Surgeon: Gaye Pollack, MD;  Location: [place];  Service: Open Heart Surgery;  Laterality: N/A;  . ENDARTERECTOMY Right 10/07/2019   Procedure: ENDARTERECTOMY CAROTID;  Surgeon: Rosetta Posner, MD;  Location[facility] OR;  Service: Vascular;  Laterality: Right;  . ENDOVEIN HARVEST OF GREATER SAPHENOUS VEIN Left 10/07/2019   Procedure: [place] Of Greater Saphenous Vein;  Surgeon: Gaye Pollack, MD;  Location: [facility] OR;  Service: Open Heart Surgery;  Laterality: Left;  . ESOPHAGOGASTRODUODENOSCOPY ENDOSCOPY    . KNEE ARTHROSCOPY  07/25/04   left  . [place]   right, open surgery- repair  . RIGHT/LEFT HEART CATH AND CORONARY ANGIOGRAPHY Bilateral 09/08/2019   Procedure: RIGHT/LEFT HEART CATH AND CORONARY ANGIOGRAPHY;  Surgeon: Minna Merritts, MD;  Location: [place] CV LAB;  Service: Cardiovascular;  Laterality: Bilateral;  . TEE WITHOUT CARDIOVERSION N/A 10/07/2019   Procedure: TRANSESOPHAGEAL ECHOCARDIOGRAM (TEE);  Surgeon: Gaye Pollack, MD;  Location: [place];  Service: Open Heart Surgery;  Laterality: N/A;  . 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 TOTAL KNEE ARTHROPLASTY Right 07/13/2017   Procedure: RIGHT TOTAL KNEE ARTHROPLASTY;  Surgeon: Gaynelle Arabian, MD;  Location: [facility] ORS;  Service: Orthopedics;  Laterality: Right;     reports that he quit smoking about 2 months ago. He has a  40.00 pack-year smoking history. He has never used smokeless tobacco. He reports current alcohol use. He reports that he does not use drugs.  Allergies  Allergen Reactions  . Ramipril Cough  . Mucinex D [Pseudoephedrine-Guaifenesin Er] Other (See Comments) and Hypertension    Elevated BP, insomnia    Family History  Problem Relation Age of Onset  . Heart disease Father        CAD, MI x 3  . Hypertension Father   . Alcohol abuse Father   . Diabetes Father   . Diabetes Sister   . Cancer Brother        lymphoma, in remission  . Colon cancer Brother   . Heart disease Sister        CABG x 3  . Prostate cancer Neg Hx      Prior to Admission medications   Medication Sig Start Date End Date Taking? Authorizing Provider  acetaminophen (TYLENOL) 500 MG tablet Take 1,000 mg by mouth every 8 (eight) hours as needed for mild pain or  headache.    [provider]  ALPRAZolam Duanne Moron) 0.25 MG tablet Take 0.25 mg by mouth 2 (two) times daily as needed for anxiety.  08/03/19   [provider]  amLODipine (NORVASC) 10 MG tablet Take 1 tablet (10 mg total) by mouth daily. 10/26/19   Loel Dubonnet, NP  aspirin EC 81 MG tablet Take 1 tablet (81 mg total) by mouth daily. 10/14/19   Gold, Wayne E, PA-C  atorvastatin (LIPITOR) 80 MG tablet Take 1 tablet (80 mg total) by mouth daily. 11/21/14   Tonia Ghent, MD  clopidogrel (PLAVIX) 75 MG tablet Take 75 mg by mouth daily.    [provider]  fenofibrate 160 MG tablet Take 1 tablet (160 mg total) by mouth daily. 01/11/15   Tonia Ghent, MD  furosemide (LASIX) 40 MG tablet Take 1 tablet (40 mg total) by mouth daily. 10/14/19   Gold, Wilder Glade, PA-C  glimepiride (AMARYL) 2 MG tablet Take one half tablet (1 mg) by mouth each morning Patient taking differently: Take 2 mg by mouth daily with breakfast.  01/11/15   Tonia Ghent, MD  glucose blood (ACCU-CHEK AVIVA PLUS) test strip Use to check blood sugar once daily or as needed.   Diagnosis:  E11.9   Non insulin-dependent. 12/14/14   Tonia Ghent, MD  Lancets (ACCU-CHEK MULTICLIX) lancets Use as instructed to check blood sugar once daily or as needed.  Diagnosis:  E11.9   Non insulin-dependent. 12/14/14   Tonia Ghent, MD  metFORMIN (GLUCOPHAGE) 1000 MG tablet Take 1 tablet (1,000 mg total) by mouth 2 (two) times daily. 01/11/15   Tonia Ghent, MD  metoprolol tartrate (LOPRESSOR) 25 MG tablet Take 1 tablet (25 mg total) by mouth 2 (two) times daily. 01/11/15   Tonia Ghent, MD  nicotine (NICODERM CQ - DOSED IN MG/24 HOURS) 21 mg/24hr patch Place 21 mg onto the skin daily as needed (nicotine dependence).     [provider]  pantoprazole (PROTONIX) 40 MG tablet Take 40 mg by mouth daily before breakfast. 09/01/19   [provider]  spironolactone (ALDACTONE) 25 MG tablet Take 1 tablet (25 mg total) by mouth daily. 10/17/19 01/15/20  Bensimhon, Shaune Pascal, MD  traMADol (ULTRAM) 50 MG tablet Take 1 tablet (50 mg total) by mouth every 6 (six) hours as needed for moderate pain. Patient not taking: Reported on 12/07/2019 11/02/19   Gaye Pollack, MD  traZODone (DESYREL) 50 MG tablet Take 1 tablet (50 mg total) by mouth at bedtime as needed for up to 14 doses for sleep. Patient not taking: Reported on 12/07/2019 11/14/19   Gaye Pollack, MD    Physical Exam: Vitals:   12/07/19 1345 12/07/19 1400 12/07/19 1430 12/07/19 1500  BP:  129/68 (!) 143/88 120/63  Pulse: 73 75 74 72  Resp:  15 (!) 27 16  Temp:      TempSrc:      SpO2: 94% 95% 95% 95%  Weight:      Height:         Vitals:   12/07/19 1345 12/07/19 1400 12/07/19 1430 12/07/19 1500  BP:  129/68 (!) 143/88 120/63  Pulse: 73 75 74 72  Resp:  15 (!) 27 16  Temp:      TempSrc:      SpO2: 94% 95% 95% 95%  Weight:      Height:        Constitutional: NAD, alert and oriented x 3 Eyes:  PERRL, lids and conjunctivae normal ENMT: Mucous membranes are moist.  Neck: normal, supple, no masses, no  thyromegaly Respiratory:  Decrease air entry at the bases, no wheezing, no crackles. Normal respiratory effort. No accessory muscle use.  Cardiovascular: Regular rate and rhythm, no murmurs / rubs / gallops. 2+ extremity edema. 2+ pedal pulses. No carotid bruits.  Abdomen: no tenderness, no masses palpated. No hepatosplenomegaly. Bowel sounds positive.  Musculoskeletal: no clubbing / cyanosis. No joint deformity upper and lower extremities.  Skin: no rashes, lesions, ulcers.  Neurologic: No gross focal neurologic deficit. Psychiatric: Normal mood and affect.   Labs on Admission: I have personally reviewed following labs and imaging studies  CBC: Recent Labs  Lab 12/07/19 1218  WBC 9.3  NEUTROABS 7.0  HGB 10.6*  HCT 37.1*  MCV 76.3*  PLT 426   Basic Metabolic Panel: Recent Labs  Lab 12/07/19 1218  NA 136  K 4.6  CL 91*  CO2 34*  GLUCOSE 114*  BUN 38*  CREATININE 1.92*  CALCIUM 9.1   GFR: Estimated Creatinine Clearance: 46.5 mL/min (A) (by C-G formula based on SCr of 1.92 mg/dL (H)). Liver Function Tests: Recent Labs  Lab 12/07/19 1218  AST 19  ALT 22  ALKPHOS 25*  BILITOT 1.0  PROT 7.2  ALBUMIN 3.8   No results for input(s): LIPASE, AMYLASE in the last 168 hours. No results for input(s): AMMONIA in the last 168 hours. Coagulation Profile: Recent Labs  Lab 12/07/19 1218  INR 1.2   Cardiac Enzymes: No results for input(s): CKTOTAL, CKMB, CKMBINDEX, TROPONINI in the last 168 hours. BNP (last 3 results) No results for input(s): PROBNP in the last 8760 hours. HbA1C: No results for input(s): HGBA1C in the last 72 hours. CBG: No results for input(s): GLUCAP in the last 168 hours. Lipid Profile: No results for input(s): CHOL, HDL, LDLCALC, TRIG, CHOLHDL, LDLDIRECT in the last 72 hours. Thyroid Function Tests: No results for input(s): TSH, T4TOTAL, FREET4, T3FREE, THYROIDAB in the last 72 hours. Anemia Panel: No results for input(s): VITAMINB12, FOLATE,  FERRITIN, TIBC, IRON, RETICCTPCT in the last 72 hours. Urine analysis:    Component Value Date/Time   COLORURINE STRAW (A) 10/05/2019 0957   APPEARANCEUR CLEAR 10/05/2019 0957   LABSPEC 1.005 10/05/2019 0957   PHURINE 5.0 10/05/2019 0957   GLUCOSEU NEGATIVE 10/05/2019 0957   HGBUR NEGATIVE 10/05/2019 0957   BILIRUBINUR NEGATIVE 10/05/2019 0957   KETONESUR NEGATIVE 10/05/2019 0957   PROTEINUR NEGATIVE 10/05/2019 0957   NITRITE NEGATIVE 10/05/2019 0957   LEUKOCYTESUR NEGATIVE 10/05/2019 0957    Radiological Exams on Admission: DG Chest Port 1 View  Result Date: 12/07/2019 CLINICAL DATA:  Hypoxemia EXAM: PORTABLE CHEST 1 VIEW COMPARISON:  11/02/2019 FINDINGS: Mild cardiac enlargement. Mild vascular congestion without edema. Small left effusion. Negative for pneumonia. Mild atelectasis in the bases. IMPRESSION: Mild vascular congestion and small left effusion. Negative for edema. Electronically Signed   By: Franchot Gallo M.D.   On: 12/07/2019 12:58    EKG: Independently reviewed.  Sinus rhythm, PVCs  Assessment/Plan Principal Problem:   Acute on chronic diastolic CHF (congestive heart failure) (HCC) Active Problems:   DISORDER, TOBACCO USE   Essential hypertension   COPD (chronic obstructive pulmonary disease) (HCC)   CAD (coronary artery disease)   Morbid obesity (HCC)   Acute on chronic respiratory failure with hypercapnia (HCC)    Acute on chronic diastolic dysfunction CHF Last known LVEF of 60 - 65% Patient presents for evaluation of exertional SOB, lower extremity  swelling and 14lb weight gain Place patient on IV diuretic therapy Maintain low sodium diet Continue Metoprolol Consult cardiology   Acute on chronic respiratory failure with hypercapnia Patient was hypoxic in the field with pulse oximetry in the low 70s and also had increased work of breathing Acute worsening secondary to acute CHF exacerbation He required noninvasive mechanical ventilation to reduce his  work of breathing and improve oxygenation Arterial blood gas showed uncompensated respiratory acidosis with hypercarbia Continue BiPAP and wean off as tolerated   CAD S/P CABG Continue aspirin, statins, beta-blocker and Plavix   Hypertension Blood pressure stable Continue amlodipine and metoprolol   Nicotine dependence Smoking cessation has been discussed with patient in detail Continue nicotine transdermal patch   Diabetes mellitus Maintain consistent carbohydrate diet Glycemic control with sliding scale coverage.   COPD Without acute exacerbation Continue PRN bronchodilator therapy Continue inhaled steroids   DVT prophylaxis: Lovenox Code Status: Full Family Communication: Greater than 50% of time was spent discussing plan of care with patient at bedside.  All questions and concerns have been addressed. Disposition Plan: Back to previous home environment Consults called: Cardiology    Collier Bullock MD [facility]     12/07/2019, 4:29 PM

## 2019-12-07 NOTE — Plan of Care (Signed)
Patient admitted to 237 in stable condition. Pulse oximetry 88-92% on 6l nasal cannula. No complaints of labored breathing. Patient sleeping pulse ox desaturated to 70% placed back on bipap. Pulse ox improved to 90%. Continue to monitor.

## 2019-12-07 NOTE — ED Notes (Signed)
PT assisted with urinal and scooted up in bed  Turned TV on for pt comfort

## 2019-12-07 NOTE — Progress Notes (Signed)
Bed available on ICU now, bed assigned is 19.

## 2019-12-07 NOTE — Patient Instructions (Signed)
Medication Instructions:  Your physician recommends that you continue on your current medications as directed. Please refer to the Current Medication list given to you today.  *If you need a refill on your cardiac medications before your next appointment, please call your pharmacy*   Lab Work: None ordered If you have labs (blood work) drawn today and your tests are completely normal, you will receive your results only by: Marland Kitchen MyChart Message (if you have MyChart) OR . A paper copy in the mail If you have any lab test that is abnormal or we need to change your treatment, we will call you to review the results.   Testing/Procedures: None ordered    Follow-Up: At [facility], you and your health needs are our priority.  As part of our continuing mission to provide you with exceptional heart care, we have created designated Provider Care Teams.  These Care Teams include your primary Cardiologist (physician) and Advanced Practice Providers (APPs -  Physician Assistants and Nurse Practitioners) who all work together to provide you with the care you need, when you need it.  We recommend signing up for the patient portal called "MyChart".  Sign up information is provided on this After Visit Summary.  MyChart is used to connect with patients for Virtual Visits (Telemedicine).  Patients are able to view lab/test results, encounter notes, upcoming appointments, etc.  Non-urgent messages can be sent to your provider as well.   To learn more about what you can do with MyChart, go to [email or web address].    Your next appointment:   2 week(s)  The format for your next appointment:   In Person  Provider:    You may see Ida Rogue, MD or one of the following Advanced Practice Providers on your designated Care Team:    Murray Hodgkins, NP  Christell Faith, PA-C  Marrianne Mood, PA-C    Other Instructions Please report to ED.

## 2019-12-07 NOTE — Progress Notes (Signed)
Attempted to phone wife of pt, Joshua Roy,  at [phone] to notify her of pt changing units/rooms.  No answer and no voicemail available.

## 2019-12-07 NOTE — ED Notes (Addendum)
Pt repositioned up in the bed. O2 currently reading 80% RA. Pt placed on 2L [place] at this time. O2 improved to 83%, pt then adjusted to 4L [place] with O2 at 88%.  Will inform MD

## 2019-12-07 NOTE — ED Triage Notes (Signed)
Went to heart care for taking on water and recent bypass.  Sent here for pulse ox 60s on RA.  In triage pulse ox is 89--88-91.

## 2019-12-07 NOTE — ED Notes (Signed)
Pt given urinal.

## 2019-12-07 NOTE — Progress Notes (Signed)
Report called to [place], RN on ICU.  Pt being transferred via bed to ICU 19.

## 2019-12-07 NOTE — ED Notes (Signed)
RT at bedside.

## 2019-12-07 NOTE — ED Notes (Signed)
Date and time results received: 12/07/19 1255 (use smartphrase ".now" to insert current time)  Test: pCO2 Critical Value: 39  Name of Provider Notified: [place]  Orders Received? Or Actions Taken?: Orders Received - See Orders for details

## 2019-12-07 NOTE — ED Provider Notes (Addendum)
[facility] Emergency Department Provider Note  ____________________________________________   First MD Initiated Contact with Patient 12/07/19 1211     (approximate)  I have reviewed the triage vital signs and the nursing notes.  History  Chief Complaint Shortness of Breath and Leg Swelling    HPI Joshua Roy is a 69 y.o. male with history of CAD s/p recent CABG in March, carotid disease s/p right CEA, COPD, CKD, HFpEF who presents to the emergency department for fluid overload, hypoxia.  Patient reports over the last 10 to 14 days he has been experiencing increased dyspnea on exertion, lower abdominal swelling, orthopnea, and weight gain.  Symptoms have been constant since onset and progressively worsening over this time period.  He has a pulse oximeter at home and has been noticing his readings are low.  He reports compliance with his Lasix.  Denies any associated chest pain.  States he is not on any home inhalers for his COPD. No fevers, cough, or sick contacts. Vaccinated against [place]. Currently being evaluated for a CPAP machine for likely OSA but does not currently wear oxygen at night.  Patient was being seen in the cardiology clinic today for his symptoms, where he was noted to be hypoxic down to the 70s on RA and therefore referred to the ER for evaluation, likely admission for IV diuresis.   Past Medical Hx Past Medical History:  Diagnosis Date  . (HFpEF) heart failure with preserved ejection fraction ([place])    a. 07/2019 Echo: EF 60-65%. Mod LVH. Gr1 DD. No rwma. Nl RV size/fxn. Mildly dil LA.  Marland Kitchen ACE-inhibitor cough   . Anxiety   . Bronchitis 06/27/2016  . Carotid arterial disease ([place])    a. 09/2019 Carotid U/S: RICA 96-29%, LICA 5-28%; b. 10/1322 R CEA.  . CKD (chronic kidney disease), stage III   . COPD (chronic obstructive pulmonary disease) (HCC)    cath, mild inf. hypokinesis EF 49%, 100% ROA  . Coronary artery disease    a. 2001  Cath: LM 10, LAD 60p/m, D1 30, LCX 20p, RCA 18m/d-->Med Rx; b. 05/2017 Ex Mv: Ex time 5:46, antlat twi @ rest, fixed infarct w/ mild peri-infarct ischemia. EF 38%; c. 08/2019 Cath: LM 40ost, 70d, LAD 65m, LCX 25m, RCA 80-90p, 114m; d. 09/2019 CABG x4: LIMA->LAD, VG->RI, VG->OM, VG->RPDA.  . Diabetes mellitus type II   . Hyperlipidemia   . Hypertension   . MI (myocardial infarction) ([place]) age 59  . OA (osteoarthritis)    knee OA, injected 2012 by ortho  . PAF (paroxysmal atrial fibrillation) ([place])    a. 09/2019 post-op CABG-->Amio.  . Pneumonia   . PSVT (paroxysmal supraventricular tachycardia) ([place])    a. 08/2019 Zio: 189 brief runs of SVT (fastest 174 x 6 beats, longest 15 beats @ 107).  . Tobacco abuse     Problem List Patient Active Problem List   Diagnosis Date Noted  . S/P CABG x 4 10/07/2019  . Unstable angina ([place]) 09/08/2019  . Personal history of colonic polyps   . Benign neoplasm of ascending colon   . Benign neoplasm of descending colon   . Polyp of sigmoid colon   . OA (osteoarthritis) of knee 07/14/2016  . Medicare annual wellness visit, initial 11/23/2014  . Advance care planning 11/23/2014  . Erectile dysfunction associated with type 2 diabetes mellitus ([place]) 10/03/2013  . Seborrheic keratoses 10/03/2013  . Morbid obesity ([place]) 10/03/2013  . Cough 08/01/2013  . Gastric ulcer 05/16/2013  .  CAD (coronary artery disease) 05/21/2011  . SOB (shortness of breath) 05/12/2011  . Knee pain 11/18/2010  . Essential hypertension 01/07/2007  . Uncontrolled type 2 diabetes mellitus with circulatory disorder causing erectile dysfunction ([place]) 01/06/2007  . Hyperlipidemia 01/06/2007  . Irritability 01/06/2007  . DISORDER, TOBACCO USE 01/06/2007  . COPD (chronic obstructive pulmonary disease) ([place]) 01/06/2007    Past Surgical Hx Past Surgical History:  Procedure Laterality Date  . CARDIAC CATHETERIZATION  05/06/2000   @ [place]  . COLONOSCOPY WITH PROPOFOL N/A 04/20/2018    Procedure: COLONOSCOPY WITH PROPOFOL;  Surgeon: [name], MD;  Location: [facility] ENDOSCOPY;  Service: Endoscopy;  Laterality: N/A;  . CORONARY ARTERY BYPASS GRAFT N/A 10/07/2019   Procedure: CORONARY ARTERY BYPASS GRAFTING (CABG) using LIMA to LAD; Endoscopic harvesting of left greater saphenous vein: SVG to RAMUS; SVG to OM1; SVG to PDA.;  Surgeon: Gaye Pollack, MD;  Location: [place];  Service: Open Heart Surgery;  Laterality: N/A;  . ENDARTERECTOMY Right 10/07/2019   Procedure: ENDARTERECTOMY CAROTID;  Surgeon: Rosetta Posner, MD;  Location: [facility] OR;  Service: Vascular;  Laterality: Right;  . ENDOVEIN HARVEST OF GREATER SAPHENOUS VEIN Left 10/07/2019   Procedure: [place] Of Greater Saphenous Vein;  Surgeon: Gaye Pollack, MD;  Location[facility] OR;  Service: Open Heart Surgery;  Laterality: Left;  . ESOPHAGOGASTRODUODENOSCOPY ENDOSCOPY    . KNEE ARTHROSCOPY  07/25/04   left  . [place]   right, open surgery- repair  . RIGHT/LEFT HEART CATH AND CORONARY ANGIOGRAPHY Bilateral 09/08/2019   Procedure: RIGHT/LEFT HEART CATH AND CORONARY ANGIOGRAPHY;  Surgeon: Minna Merritts, MD;  Location: [facility] CV LAB;  Service: Cardiovascular;  Laterality: Bilateral;  . TEE WITHOUT CARDIOVERSION N/A 10/07/2019   Procedure: TRANSESOPHAGEAL ECHOCARDIOGRAM (TEE);  Surgeon: Gaye Pollack, MD;  Location: [facility];  Service: Open Heart Surgery;  Laterality: N/A;  . 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 TOTAL KNEE ARTHROPLASTY Right 07/13/2017   Procedure: RIGHT TOTAL KNEE ARTHROPLASTY;  Surgeon: Gaynelle Arabian, MD;  Location: [facility] ORS;  Service: Orthopedics;  Laterality: Right;    Medications Prior to Admission medications   Medication Sig Start Date End Date Taking? Authorizing Provider  acetaminophen (TYLENOL) 500 MG tablet Take 1,000 mg by mouth every 8  (eight) hours as needed for mild pain or headache.    [provider]  ALPRAZolam Duanne Moron) 0.25 MG tablet Take 0.25 mg by mouth 2 (two) times daily as needed for anxiety.  08/03/19   [provider]  amLODipine (NORVASC) 10 MG tablet Take 1 tablet (10 mg total) by mouth daily. 10/26/19   Loel Dubonnet, NP  aspirin EC 81 MG tablet Take 1 tablet (81 mg total) by mouth daily. 10/14/19   Gold, Wayne E, PA-C  atorvastatin (LIPITOR) 80 MG tablet Take 1 tablet (80 mg total) by mouth daily. 11/21/14   Tonia Ghent, MD  clopidogrel (PLAVIX) 75 MG tablet Take 75 mg by mouth daily.    [provider]  fenofibrate 160 MG tablet Take 1 tablet (160 mg total) by mouth daily. 01/11/15   Tonia Ghent, MD  furosemide (LASIX) 40 MG tablet Take 1 tablet (40 mg total) by mouth daily. 10/14/19   Gold, Wayne E, PA-C  glimepiride (AMARYL) 2 MG tablet Take one half tablet (1 mg) by mouth each morning Patient  taking differently: Take 2 mg by mouth daily with breakfast.  01/11/15   Tonia Ghent, MD  glucose blood (ACCU-CHEK AVIVA PLUS) test strip Use to check blood sugar once daily or as needed.  Diagnosis:  E11.9   Non insulin-dependent. 12/14/14   Tonia Ghent, MD  Lancets (ACCU-CHEK MULTICLIX) lancets Use as instructed to check blood sugar once daily or as needed.  Diagnosis:  E11.9   Non insulin-dependent. 12/14/14   Tonia Ghent, MD  metFORMIN (GLUCOPHAGE) 1000 MG tablet Take 1 tablet (1,000 mg total) by mouth 2 (two) times daily. 01/11/15   Tonia Ghent, MD  metoprolol tartrate (LOPRESSOR) 25 MG tablet Take 1 tablet (25 mg total) by mouth 2 (two) times daily. 01/11/15   Tonia Ghent, MD  nicotine (NICODERM CQ - DOSED IN MG/24 HOURS) 21 mg/24hr patch Place 21 mg onto the skin daily as needed (nicotine dependence).     [provider]  pantoprazole (PROTONIX) 40 MG tablet Take 40 mg by mouth daily before breakfast. 09/01/19   [provider]  spironolactone  (ALDACTONE) 25 MG tablet Take 1 tablet (25 mg total) by mouth daily. 10/17/19 01/15/20  Bensimhon, Shaune Pascal, MD  traMADol (ULTRAM) 50 MG tablet Take 1 tablet (50 mg total) by mouth every 6 (six) hours as needed for moderate pain. Patient not taking: Reported on 12/07/2019 11/02/19   Gaye Pollack, MD  traZODone (DESYREL) 50 MG tablet Take 1 tablet (50 mg total) by mouth at bedtime as needed for up to 14 doses for sleep. Patient not taking: Reported on 12/07/2019 11/14/19   Gaye Pollack, MD    Allergies Ramipril and Mucinex d [pseudoephedrine-guaifenesin er]  Family Hx Family History  Problem Relation Age of Onset  . Heart disease Father        CAD, MI x 3  . Hypertension Father   . Alcohol abuse Father   . Diabetes Father   . Diabetes Sister   . Cancer Brother        lymphoma, in remission  . Colon cancer Brother   . Heart disease Sister        CABG x 3  . Prostate cancer Neg Hx     Social Hx Social History   Tobacco Use  . Smoking status: Former Smoker    Packs/day: 1.00    Years: 40.00    Pack years: 40.00    Quit date: 10/07/2019    Years since quitting: 0.1  . Smokeless tobacco: Never Used  . Tobacco comment: per patient wears a patch and has cut down on cigarettes/day (10/05/2019)  Substance Use Topics  . Alcohol use: Yes    Alcohol/week: 0.0 standard drinks    Comment: 5-6 cocktails, 1 times a week  . Drug use: No     Review of Systems  Constitutional: Negative for fever. Negative for chills. Eyes: Negative for visual changes. ENT: Negative for sore throat. Cardiovascular: Negative for chest pain. Respiratory: + for shortness of breath. Gastrointestinal: Negative for nausea. Negative for vomiting.  Genitourinary: Negative for dysuria. Musculoskeletal: + for leg swelling. Skin: Negative for rash. Neurological: Negative for headaches.   Physical Exam  Vital Signs: ED Triage Vitals  Enc Vitals Group     BP 12/07/19 1200 108/80     Pulse Rate 12/07/19  1200 70     Resp 12/07/19 1200 18     Temp 12/07/19 1200 97.8 F (36.6 C)     Temp Source 12/07/19 1200  Oral     SpO2 12/07/19 1200 (!) 87 %     Weight 12/07/19 1212 264 lb 8 oz (120 kg)     Height 12/07/19 1201 5\' 10"  (1.778 m)     Head Circumference --      Peak Flow --      Pain Score 12/07/19 1201 0     Pain Loc --      Pain Edu? --      Excl. in [place]? --     Constitutional: Alert and oriented.  Falls asleep easily during exam.  When asleep, oxygen drops to the upper 70s, low 80s. Head: Normocephalic. Atraumatic. Eyes: Conjunctivae clear. Sclera anicteric. Pupils equal and symmetric. Nose: No masses or lesions. No congestion or rhinorrhea. Mouth/Throat: Wearing mask.  Neck: No stridor. Trachea midline.  Cardiovascular: Normal rate, regular rhythm. Extremities well perfused. Respiratory: Normal respiratory effort.  When asleep his oxygen drops to the upper 70s/low 80s.  While awake on 6 L [place] oxygen mid to high 90s.  Coarse lung sounds bilaterally. Gastrointestinal: Soft. Non-distended. Non-tender.  Genitourinary: Deferred. Musculoskeletal: Significant, bilateral symmetric pitting edema of the lower extremities to the mid thigh. Neurologic:  Normal speech and language. No gross focal or lateralizing neurologic deficits are appreciated.  Skin: Skin is warm, dry and intact. No rash noted. Psychiatric: Mood and affect are appropriate for situation.  EKG  Personally reviewed and interpreted by myself.   Date: 12/07/19 Time: 1211 Rate: 70 Rhythm: sinus Axis: right Intervals: WNL Nonspecific ST, T wave changes, no acute ischemic changes No STEMI    Radiology  Personally reviewed available imaging myself.   CXR - IMPRESSION:  Mild vascular congestion and small left effusion. Negative for  edema.    Procedures  Procedure(s) performed (including critical care):  .Critical Care Performed by: Lilia Pro., MD Authorized by: Lilia Pro., MD   Critical care  provider statement:    Critical care time (minutes):  35   Critical care was necessary to treat or prevent imminent or life-threatening deterioration of the following conditions:  Respiratory failure   Critical care was time spent personally by me on the following activities:  Discussions with consultants, evaluation of patient's response to treatment, examination of patient, ordering and performing treatments and interventions, ordering and review of laboratory studies, ordering and review of radiographic studies, pulse oximetry, re-evaluation of patient's condition, obtaining history from patient or surrogate and review of old charts     Initial Impression / Assessment and Plan / MDM / ED Course  68 y.o. male with past medical history as above, who presents to the ED for shortness of breath, DOE, bilateral lower extremity swelling, noted to be hypoxic in the cardiology clinic today.  Presentation concerning for HF exacerbation, also consider concurrent COPD exacerbation.  Other differential includes pulmonary infection, pneumonia, COVID. Clinically appears consistent with sleep apnea as well with his significant desats when asleep and recovery when awake. Likely multifactorial.  Will plan for labs, imaging, EKG.   Clinical Course as of Dec 07 1450  Wed Dec 07, 2019  1311 VBG reveals hypercarbia, 85, borderline pH 7.27.  Likely acute on chronic given elevated bicarb.  CXR also notable for vascular congestion.  With concomitant COPD exacerbation and HF exacerbation will place on BiPAP, especially as he seems to be falling asleep during evaluation and becomes hypoxic with sleeping (high suspicion for underlying sleep apnea).  Of note, patient's mental status when awake is clear and logical, no AMS or  confusion. Will diurese and give DuoNeb through BiPAP as well.    [SM]  1414 Initial troponin negative, BNP elevated at 475.  COVID negative.  Tolerating BiPAP well.  Will admit for HF and COPD  exacerbation.   [SM]    Clinical Course User Index [SM] Lilia Pro., MD     _______________________________   As part of my medical decision making I have reviewed available labs, radiology tests, reviewed old records/performed chart review.    Final Clinical Impression(s) / ED Diagnosis  SOB OSA Hypoxia HF exacerbation COPD exacerbation    Note:  This document was prepared using Dragon voice recognition software and may include unintentional dictation errors.       Lilia Pro., MD 12/07/19 1452

## 2019-12-08 ENCOUNTER — Inpatient Hospital Stay (HOSPITAL_COMMUNITY)
Admit: 2019-12-08 | Discharge: 2019-12-08 | Disposition: A | Discharge status: Home / Self Care | Payer: Medicare Other | Attending: Pulmonary Disease | Admitting: Pulmonary Disease

## 2019-12-08 DIAGNOSIS — I25118 Atherosclerotic heart disease of native coronary artery with other forms of angina pectoris: Secondary | ICD-10-CM

## 2019-12-08 DIAGNOSIS — F17200 Nicotine dependence, unspecified, uncomplicated: Secondary | ICD-10-CM

## 2019-12-08 DIAGNOSIS — J9622 Acute and chronic respiratory failure with hypercapnia: Secondary | ICD-10-CM

## 2019-12-08 DIAGNOSIS — I5031 Acute diastolic (congestive) heart failure: Secondary | ICD-10-CM

## 2019-12-08 DIAGNOSIS — I5033 Acute on chronic diastolic (congestive) heart failure: Secondary | ICD-10-CM

## 2019-12-08 DIAGNOSIS — J449 Chronic obstructive pulmonary disease, unspecified: Secondary | ICD-10-CM

## 2019-12-08 DIAGNOSIS — J432 Centrilobular emphysema: Secondary | ICD-10-CM

## 2019-12-08 LAB — GLUCOSE, CAPILLARY
Glucose-Capillary: 118 mg/dL — ABNORMAL HIGH (ref 70–99)
Glucose-Capillary: 194 mg/dL — ABNORMAL HIGH (ref 70–99)
Glucose-Capillary: 231 mg/dL — ABNORMAL HIGH (ref 70–99)
Glucose-Capillary: 232 mg/dL — ABNORMAL HIGH (ref 70–99)
Glucose-Capillary: 190 mg/dL — ABNORMAL HIGH (ref 70–99)

## 2019-12-08 LAB — HEMOGLOBIN A1C
Hgb A1c MFr Bld: 7.4 % — ABNORMAL HIGH (ref 4.8–5.6)
Mean Plasma Glucose: 165.68 mg/dL

## 2019-12-08 LAB — CBC
HCT: 34.5 % — ABNORMAL LOW (ref 39.0–52.0)
Hemoglobin: 10 g/dL — ABNORMAL LOW (ref 13.0–17.0)
MCH: 21.6 pg — ABNORMAL LOW (ref 26.0–34.0)
MCHC: 29 g/dL — ABNORMAL LOW (ref 30.0–36.0)
MCV: 74.5 fL — ABNORMAL LOW (ref 80.0–100.0)
Platelets: 284 10*3/uL (ref 150–400)
RBC: 4.63 MIL/uL (ref 4.22–5.81)
RDW: 19.9 % — ABNORMAL HIGH (ref 11.5–15.5)
WBC: 7.5 10*3/uL (ref 4.0–10.5)
nRBC: 0.5 % — ABNORMAL HIGH (ref 0.0–0.2)

## 2019-12-08 LAB — BASIC METABOLIC PANEL
Anion gap: 9 (ref 5–15)
BUN: 39 mg/dL — ABNORMAL HIGH (ref 8–23)
CO2: 37 mmol/L — ABNORMAL HIGH (ref 22–32)
Calcium: 8.7 mg/dL — ABNORMAL LOW (ref 8.9–10.3)
Chloride: 92 mmol/L — ABNORMAL LOW (ref 98–111)
Creatinine, Ser: 1.96 mg/dL — ABNORMAL HIGH (ref 0.61–1.24)
GFR calc Af Amer: 39 mL/min — ABNORMAL LOW (ref >60)
GFR calc non Af Amer: 34 mL/min — ABNORMAL LOW (ref >60)
Glucose, Bld: 139 mg/dL — ABNORMAL HIGH (ref 70–99)
Potassium: 4.4 mmol/L (ref 3.5–5.1)
Sodium: 138 mmol/L (ref 135–145)

## 2019-12-08 LAB — BRAIN NATRIURETIC PEPTIDE: B Natriuretic Peptide: 411 pg/mL — ABNORMAL HIGH (ref 0.0–100.0)

## 2019-12-08 LAB — HIV ANTIBODY (ROUTINE TESTING W REFLEX): HIV Screen 4th Generation wRfx: NONREACTIVE

## 2019-12-08 LAB — ECHOCARDIOGRAM COMPLETE
Height: 70 in
Weight: 4084.68 oz

## 2019-12-08 MED ORDER — METHYLPREDNISOLONE SODIUM SUCC 40 MG IJ SOLR: 20.0000 mg | Freq: Every day | INTRAMUSCULAR | Status: DC

## 2019-12-08 MED ORDER — IPRATROPIUM-ALBUTEROL 0.5-2.5 (3) MG/3ML IN SOLN
3.0000 mL | Freq: Four times a day (QID) | RESPIRATORY_TRACT | Status: DC
  Administered 2019-12-08: 3 mL via RESPIRATORY_TRACT
  Filled 2019-12-08 (×2): qty 3

## 2019-12-08 MED ORDER — CHLORHEXIDINE GLUCONATE CLOTH 2 % EX PADS
6.0000 | MEDICATED_PAD | Freq: Every day | CUTANEOUS | Status: DC
  Administered 2019-12-09: 6 via TOPICAL

## 2019-12-08 NOTE — TOC Initial Note (Signed)
Transition of Care (TOC) - Initial/Assessment Note    Patient Details  Name: Joshua Roy MRN: [id number] Date of Birth: 12/08/1950  Transition of Care (TOC) CM/SW Contact:    Meagan E Hagwood, LCSW Phone Number: 12/08/2019, 1:01 PM  Clinical Narrative:                CSW received consult for Heart Failure Screen and PT/OT follow up. Please submit PT/OT consults if needed and CSW will follow up based on their recommendations.  CSW met with patient at bedside for Readmission and Heart Failure Screenings. Patient stated he lives at home with his wife and drives himself to appointments. Patient reported he uses [facility] on [address] and denied issues with obtaining his meds. PCP is Dr. Masoud. Patient has home oxygen through [employer]. Patient reported he had Home Health services after his surgery in March, could not recall which agency was used, and denied the need for Home Health at this time. Patient denied any SNF history. Stated he has been to Outpatient PT in the past. Patient reported he has a scale at home and monitors his weight. Hs is aware to report weight change to his Doctor. Patient denied any needs at this time. CSW will continue to follow.    Expected Discharge Plan: Home/Self Care Barriers to Discharge: Continued Medical Work up   Patient Goals and CMS Choice Patient states their goals for this hospitalization and ongoing recovery are:: to return home with his wife      Expected Discharge Plan and Services Expected Discharge Plan: Home/Self Care       Living arrangements for the past 2 months: Single Family Home                                      Prior Living Arrangements/Services Living arrangements for the past 2 months: Single Family Home Lives with:: Spouse Patient language and need for interpreter reviewed:: Yes Do you feel safe going back to the place where you live?: Yes      Need for Family Participation in Patient Care: Yes  (Comment) Care giver support system in place?: Yes (comment) Current home services: DME Criminal Activity/Legal Involvement Pertinent to Current Situation/Hospitalization: No - Comment as needed  Activities of Daily Living Home Assistive Devices/Equipment: None ADL Screening (condition at time of admission) Patient's cognitive ability adequate to safely complete daily activities?: Yes Is the patient deaf or have difficulty hearing?: No Does the patient have difficulty seeing, even when wearing glasses/contacts?: No Does the patient have difficulty concentrating, remembering, or making decisions?: No Patient able to express need for assistance with ADLs?: No Does the patient have difficulty dressing or bathing?: No Independently performs ADLs?: Yes (appropriate for developmental age) Does the patient have difficulty walking or climbing stairs?: No Weakness of Legs: None Weakness of Arms/Hands: None  Permission Sought/Granted                  Emotional Assessment Appearance:: Appears stated age Attitude/Demeanor/Rapport: Engaged Affect (typically observed): Calm, Appropriate Orientation: : Oriented to Situation, Oriented to  Time, Oriented to Place, Oriented to Self Alcohol / Substance Use: Not Applicable Psych Involvement: No (comment)  Admission diagnosis:  SOB (shortness of breath) [R06.02] Acute on chronic diastolic CHF (congestive heart failure) (HCC) [I50.33] Patient Active Problem List   Diagnosis Date Noted  . Acute on chronic diastolic CHF (congestive heart failure) (HCC)   12/07/2019  . Acute on chronic respiratory failure with hypercapnia ([place]) 12/07/2019  . S/P CABG x 4 10/07/2019  . Unstable angina ([place]) 09/08/2019  . Personal history of colonic polyps   . Benign neoplasm of ascending colon   . Benign neoplasm of descending colon   . Polyp of sigmoid colon   . OA (osteoarthritis) of knee 07/14/2016  . Medicare annual wellness visit, initial 11/23/2014  .  Advance care planning 11/23/2014  . Erectile dysfunction associated with type 2 diabetes mellitus ([place]) 10/03/2013  . Seborrheic keratoses 10/03/2013  . Morbid obesity ([place]) 10/03/2013  . Cough 08/01/2013  . Gastric ulcer 05/16/2013  . CAD (coronary artery disease) 05/21/2011  . SOB (shortness of breath) 05/12/2011  . Knee pain 11/18/2010  . Essential hypertension 01/07/2007  . Uncontrolled type 2 diabetes mellitus with circulatory disorder causing erectile dysfunction ([place]) 01/06/2007  . Hyperlipidemia 01/06/2007  . Irritability 01/06/2007  . DISORDER, TOBACCO USE 01/06/2007  . COPD (chronic obstructive pulmonary disease) ([place]) 01/06/2007   PCP:  [name], MD Pharmacy[facility]   [id number] [place] [place]Phone: [phone]: [phone]    Social Determinants of Health (SDOH) Interventions    Readmission Risk Interventions Readmission Risk Prevention Plan 12/08/2019  Transportation Screening Complete  PCP or Specialist Appt within 3-5 Days Complete  HRI or Home Care Consult Complete  Medication Review (RN Care Manager) Complete  Some recent data might be hidden

## 2019-12-08 NOTE — Progress Notes (Signed)
Placed patient on bipap. Pressures titrated as presentation a lot different than last shift.  Patient tolerating thus far. Will continue to monitor.

## 2019-12-08 NOTE — Progress Notes (Signed)
Patient remains lethargic.  Ordered ABG per physician.  Cannot deliver breakfast or morning medicines amlodipine, metoprolol d/t lethargic state.

## 2019-12-08 NOTE — Progress Notes (Signed)
Patient has done well today.  Per ABG patient was severely hypercapnic this morning but began to respond to environment once awakened and BiPAP removed. He remains AOx4.  His HR is NSR and his vitals are WDL.  He has course crackles in his lungs and he is still receiving HFNC @ 8L.  His oxygen saturation is recorded around 90-94%. Patient consumed his meals today and he sat at the bedside for about 2 1/2 hours. His wife came to visit for a few hours.  If he can maintain his HFNC around 6-7 L and saturations around 90%, physician plans for him to go to floor tomorrow.  He was delivered 40mg  lasix today and has had significant UOP.  Approx 1059ml

## 2019-12-08 NOTE — Progress Notes (Signed)
*  PRELIMINARY RESULTS* Echocardiogram 2D Echocardiogram has been performed.  Sherrie Sport 12/08/2019, 11:22 AM

## 2019-12-08 NOTE — Progress Notes (Signed)
Patient more alert now and off BiPAP.  Will try to attempt meds.

## 2019-12-08 NOTE — Progress Notes (Signed)
PROGRESS NOTE    Joshua Roy  UMP:[id number] DOB: 07/08/1951 DOA: 12/07/2019 PCP: [name], MD    Assessment & Plan:   Principal Problem:   Acute on chronic diastolic CHF (congestive heart failure) ([place]) Active Problems:   DISORDER, TOBACCO USE   Essential hypertension   COPD (chronic obstructive pulmonary disease) ([place])   CAD (coronary artery disease)   Morbid obesity ([place])   Acute on chronic respiratory failure with hypercapnia ([place])    Joshua Roy is a 69 y.o. male with medical history significant for coronary artery disease status post recent four-vessel CABG, carotid disease status post recent carotid endarterectomy, history of A. 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 Patient complains of easy fatigability as well as trouble sleeping.  He also complains of dyspnea with exertion, bilateral lower extremity swelling, increased abdominal girth, orthopnea and a 14 pound weight gain since his last hospitalization.  He reports compliance with his medications but does not think he has been compliant with his diet.  He continues to smoke cigarettes.  Patient has had an increase in the dose of his diuretic from 40 mg daily to 40 mg in the morning and 20 in the afternoon by his primary care provider because of his significant weight gain. While in the office he was noted to have a pulse oximetry of 68% on room air, patient is not on home oxygen.  He was sent to the emergency room for further evaluation.   Acute on chronic diastolic dysfunction CHF Last known LVEF of 60 - 65% Patient presents for evaluation of exertional SOB, lower extremity swelling and 14lb weight gain --Cardiology consulted PLAN: --continue Lasix 40 every 12 with close monitoring of renal function --continue  Aldactone  Acute on chronic respiratory failure with hypercapnia Likely OSA Patient was hypoxic in the field with pulse oximetry in the low 70s and also had increased work of breathing Acute worsening secondary to acute CHF exacerbation He required noninvasive mechanical ventilation to reduce his work of breathing and improve oxygenation Arterial blood gas showed uncompensated respiratory acidosis with hypercarbia --Continue BiPAP nightly   CAD S/P CABG Continue aspirin, statins, beta-blocker and Plavix   Hypertension Blood pressure stable Continue amlodipine and metoprolol   Nicotine dependence Smoking cessation has been discussed with patient in detail Continue nicotine transdermal patch   Diabetes mellitus Maintain consistent carbohydrate diet Glycemic control with sliding scale coverage.   COPD Without acute exacerbation --DuoNeb QID scheduled --hold systemic steroid for now  Paroxysmal atrial fibrillation Maintaining normal sinus rhythm Continue metoprolol twice daily   DVT prophylaxis: Lovenox SQ Code Status: Full code  Family Communication:  Status is: inpatient Dispo:   The patient is from: home Anticipated d/c is to: home Anticipated d/c date is: unclear Patient currently is not medically stable to d/c due to: still on 10L high flow, needing IV diuretic.   Subjective and Interval History:  Was weaned off BiPAP to high flow this morning.  Pt reported breathing better and wanted to go home already.  O2 requirement up to 10L.  Having cough with sputum production which pt said was not new.  No fever, chest pain, abdominal pain, N/V/D, dysuria.   Objective: Vitals:   12/08/19 1600 12/08/19 1700 12/08/19 1800 12/08/19 1900  BP: (!) 111/59 (!) 116/59 112/63 (!) 119/58  Pulse: 73  72 74 74  Resp: (!) 23 (!) 32 (!) 21 (!) 24  Temp: 98.4 F (36.9 C)     TempSrc: Oral     SpO2: 100% 100% 97% 97%  Weight:      Height:        Intake/Output  Summary (Last 24 hours) at 12/08/2019 2011 Last data filed at 12/08/2019 1800 Gross per 24 hour  Intake 1040 ml  Output 1700 ml  Net -660 ml   Filed Weights   12/07/19 1212 12/07/19 1743 12/07/19 2230  Weight: 120 kg 117.8 kg 115.8 kg    Examination:   Constitutional: NAD, AAOx3 HEENT: conjunctivae and lids normal, EOMI CV: RRR no M,R,G. Distal pulses +2.  No cyanosis.   RESP: Coarse rhonchi, on 10L, sating 89% GI: +BS, NTND Extremities: 3+ pitting edema up to knees in BLE SKIN: warm, dry and intact Neuro: II - XII grossly intact.  Sensation intact Psych: Normal mood and affect.     Data Reviewed: I have personally reviewed following labs and imaging studies  CBC: Recent Labs  Lab 12/07/19 1218 12/08/19 0552  WBC 9.3 7.5  NEUTROABS 7.0  --   HGB 10.6* 10.0*  HCT 37.1* 34.5*  MCV 76.3* 74.5*  PLT 322 409   Basic Metabolic Panel: Recent Labs  Lab 12/07/19 1218 12/08/19 0552  NA 136 138  K 4.6 4.4  CL 91* 92*  CO2 34* 37*  GLUCOSE 114* 139*  BUN 38* 39*  CREATININE 1.92* 1.96*  CALCIUM 9.1 8.7*   GFR: Estimated Creatinine Clearance: 44.7 mL/min (A) (by C-G formula based on SCr of 1.96 mg/dL (H)). Liver Function Tests: Recent Labs  Lab 12/07/19 1218  AST 19  ALT 22  ALKPHOS 25*  BILITOT 1.0  PROT 7.2  ALBUMIN 3.8   No results for input(s): LIPASE, AMYLASE in the last 168 hours. No results for input(s): AMMONIA in the last 168 hours. Coagulation Profile: Recent Labs  Lab 12/07/19 1218  INR 1.2   Cardiac Enzymes: No results for input(s): CKTOTAL, CKMB, CKMBINDEX, TROPONINI in the last 168 hours. BNP (last 3 results) No results for input(s): PROBNP in the last 8760 hours. HbA1C: Recent Labs    12/07/19 1811  HGBA1C 7.4*   CBG: Recent Labs  Lab 12/07/19 2103 12/08/19 0724 12/08/19 1150 12/08/19 1551 12/08/19 2007  GLUCAP 147* 118* 194* 231* 232*   Lipid Profile: No results for input(s): CHOL, HDL, LDLCALC, TRIG, CHOLHDL, LDLDIRECT  in the last 72 hours. Thyroid Function Tests: No results for input(s): TSH, T4TOTAL, FREET4, T3FREE, THYROIDAB in the last 72 hours. Anemia Panel: No results for input(s): VITAMINB12, FOLATE, FERRITIN, TIBC, IRON, RETICCTPCT in the last 72 hours. Sepsis Labs: No results for input(s): PROCALCITON, LATICACIDVEN in the last 168 hours.  Recent Results (from the past 240 hour(s))  SARS Coronavirus 2 by RT PCR (hospital order, performed in [facility] hospital lab) Nasopharyngeal Nasopharyngeal Swab     Status: None   Collection Time: 12/07/19 12:18 PM   Specimen: Nasopharyngeal Swab  Result Value Ref Range Status   SARS Coronavirus 2 NEGATIVE NEGATIVE Final    Comment: (NOTE) SARS-CoV-2 target nucleic acids are NOT DETECTED. The SARS-CoV-2 RNA is generally detectable in upper and lower respiratory specimens during the acute phase of infection. The lowest concentration of SARS-CoV-2 viral copies this assay can detect is 250 copies / mL. A negative result does not preclude SARS-CoV-2 infection and should not be used as the sole basis for treatment or other patient  management decisions.  A negative result may occur with improper specimen collection / handling, submission of specimen other than nasopharyngeal swab, presence of viral mutation(s) within the areas targeted by this assay, and inadequate number of viral copies (<250 copies / mL). A negative result must be combined with clinical observations, patient history, and epidemiological information. Fact Sheet for Patients:   StrictlyIdeas.no Fact Sheet for Healthcare Providers: BankingDealers.co.za This test is not yet approved or cleared  by the [place] FDA and has been authorized for detection and/or diagnosis of SARS-CoV-2 by FDA under an Emergency Use Authorization (EUA).  This EUA will remain in effect (meaning this test can be used) for the duration of the COVID-19 declaration  under Section 564(b)(1) of the Act, 21 U.S.C. section 360bbb-3(b)(1), unless the authorization is terminated or revoked sooner. Performed at [facility], [address], [place], [place] [address]       Radiology Studies: DG Chest 1 View  Result Date: 12/07/2019 CLINICAL DATA:  Increasing shortness of breath EXAM: CHEST  1 VIEW COMPARISON:  Film from earlier in the same day. FINDINGS: Cardiac shadow is stable. Aortic calcifications are again noted and stable. Lungs are well aerated bilaterally. Mild vascular congestion is again seen and stable. No new focal infiltrate is seen. No bony abnormality is noted. IMPRESSION: Stable vascular congestion.  No new focal abnormality is noted. Electronically Signed   By: Inez Catalina M.D.   On: 12/07/2019 21:21   DG Chest Port 1 View  Result Date: 12/07/2019 CLINICAL DATA:  Hypoxemia EXAM: PORTABLE CHEST 1 VIEW COMPARISON:  11/02/2019 FINDINGS: Mild cardiac enlargement. Mild vascular congestion without edema. Small left effusion. Negative for pneumonia. Mild atelectasis in the bases. IMPRESSION: Mild vascular congestion and small left effusion. Negative for edema. Electronically Signed   By: Franchot Gallo M.D.   On: 12/07/2019 12:58   ECHOCARDIOGRAM COMPLETE  Result Date: 12/08/2019    ECHOCARDIOGRAM REPORT   Patient Name:   Joshua Roy [facility] Date of Exam: 12/08/2019 Medical Rec #:  [id number]            Height:       70.0 in Accession #:    [id number]           Weight:       255.3 lb Date of Birth:  1950/09/16           BSA:          2.315 m Patient Age:    50 years             BP:           119/61 mmHg Patient Gender: M                    HR:           81 bpm. Exam Location:  [facility] Procedure: 2D Echo, Cardiac Doppler and Color Doppler Indications:     CHF- acute diastolic 631.49  History:         Patient has prior history of Echocardiogram examinations, most                  recent 10/07/2019. Prior CABG, COPD; Risk Factors:Hypertension.                   MI, PAF.  Sonographer:     Sherrie Sport RDCS (AE) Referring Phys:  [id number] Bradly Bienenstock Diagnosing Phys: Ida Rogue MD  Sonographer Comments: No apical window and  no subcostal window. Image acquisition challenging due to COPD. IMPRESSIONS  1. Left ventricular ejection fraction, by estimation, is 55 to 60%. The left ventricle has normal function. The left ventricle has no regional wall motion abnormalities. There is mild left ventricular hypertrophy. Left ventricular diastolic parameters are indeterminate.  2. Right ventricular systolic function is normal. The right ventricular size is normal. Tricuspid regurgitation signal is inadequate for assessing PA pressure.  3. Left atrial size was moderately dilated.  4. Challenging image quality FINDINGS  Left Ventricle: Left ventricular ejection fraction, by estimation, is 55 to 60%. The left ventricle has normal function. The left ventricle has no regional wall motion abnormalities. The left ventricular internal cavity size was normal in size. There is  mild left ventricular hypertrophy. Left ventricular diastolic parameters are indeterminate. Right Ventricle: The right ventricular size is normal. No increase in right ventricular wall thickness. Right ventricular systolic function is normal. Tricuspid regurgitation signal is inadequate for assessing PA pressure. Left Atrium: Left atrial size was moderately dilated. Right Atrium: Right atrial size was normal in size. Pericardium: A small pericardial effusion is present. Mitral Valve: The mitral valve is normal in structure. Normal mobility of the mitral valve leaflets. No evidence of mitral valve regurgitation. No evidence of mitral valve stenosis. Tricuspid Valve: The tricuspid valve is normal in structure. Tricuspid valve regurgitation is mild . No evidence of tricuspid stenosis. Aortic Valve: The aortic valve is normal in structure. Aortic valve regurgitation is not visualized. Mild aortic valve sclerosis  is present, with no evidence of aortic valve stenosis. Pulmonic Valve: The pulmonic valve was normal in structure. Pulmonic valve regurgitation is not visualized. No evidence of pulmonic stenosis. Aorta: The aortic root is normal in size and structure. Venous: The inferior vena cava is normal in size with greater than 50% respiratory variability, suggesting right atrial pressure of 3 mmHg. IAS/Shunts: No atrial level shunt detected by color flow Doppler.  LEFT VENTRICLE PLAX 2D LVIDd:         5.21 cm LVIDs:         3.68 cm LV PW:         1.26 cm LV IVS:        1.52 cm LVOT diam:     2.10 cm LVOT Area:     3.46 cm  LEFT ATRIUM         Index LA diam:    4.70 cm 2.03 cm/m                        PULMONIC VALVE AORTA                 PV Vmax:        0.66 m/s Ao Root diam: 3.00 cm PV Peak grad:   1.7 mmHg                       RVOT Peak grad: 2 mmHg   SHUNTS Systemic Diam: 2.10 cm Ida Rogue MD Electronically signed by Ida Rogue MD Signature Date/Time: 12/08/2019/3:01:46 PM    Final      Scheduled Meds: . amLODipine  10 mg Oral Daily  . aspirin EC  81 mg Oral Daily  . atorvastatin  80 mg Oral Daily  . Chlorhexidine Gluconate Cloth  6 each Topical Daily  . clopidogrel  75 mg Oral Daily  . enoxaparin (LOVENOX) injection  40 mg Subcutaneous Q24H  . fenofibrate  160 mg Oral Daily  .  furosemide  40 mg Intravenous Q12H  . insulin aspart  0-20 Units Subcutaneous TID WC  . ipratropium-albuterol  3 mL Nebulization QID  . metoprolol tartrate  25 mg Oral BID  . pantoprazole  40 mg Oral QAC breakfast  . sodium chloride flush  3 mL Intravenous Q12H  . spironolactone  25 mg Oral Daily   Continuous Infusions: . sodium chloride       LOS: 1 day     Enzo Bi, MD [facility] If 7PM-7AM, please contact night-coverage 12/08/2019, 8:11 PM

## 2019-12-08 NOTE — Progress Notes (Signed)
Patient is responsive to name but remains dependent on BiPAP 50% FiO2.  His vitals are WDL and he is saturating in the mid 90's.  He responds to commands to grip but he does not follow commands to lift the legs or arms.  His Bilateral pulses in the wrists are 2+ but the pedals are 1+ and faint.  He has minor edema signs of 1+ to the ankles and top of his feet.

## 2019-12-08 NOTE — Consult Note (Signed)
Cardiology Consultation:   Patient ID: Joshua Roy; [id number]; 1950-10-25   Admit date: 12/07/2019 Date of Consult: 12/08/2019  Primary Care Provider: [name], MD Primary Cardiologist: Rockey Situ   Patient Profile:   Joshua Roy is a 69 y.o. male with a hx of CAD s/p recent 4-vessel CABG in 09/2019 (LIMA-LAD, VG-RI, VG-OM, VG-rPDA), carotid artery disease s/p recent right-sided CEA in 09/2019, post-op Afib, DM2, HTN, HLD, tobacco use, COPD, obesity, CKD stage III, HFpEF, and PSVT who is being seen today for the evaluation of acute on chronic diastolic CHF at the request of Dr. Francine Graven.  History of Present Illness:   Joshua Roy initially underwent cardiac cath in 2001, which revealed an occluded mid/distal RCA, and was medically managed. In 07/2019, he had a prolonged episode of presyncope followed by a brief episode of syncope, for which he was seen in the [facility] ED, but left AMA. He subsequently noted progressive weight gain, DOE, lower extremity and scrotal edema, and orthopnea. He was placed on Lasix and had significant weight loss. Echo showed normal EF with grade 1 diast dysfunction and no significant valvular pathology. He underwent diagnostic cath in 09/2019, which revealed severe, multivessel CAD. Following this, he underwent CABG x 4 in March. Preoperative carotid dopplers revealed severe RICA disease and he also underwent right-sided CEA at the time of his CABG. His post-operative course was minimally complicated by Afib, which was successfully managed with amiodarone, which has since been reduced to 200mg  daily.     He was seen in cardiology clinic on 3/30, followed by CT surgery visit on 4/7. He was seen in our office on 5/12 and noted that ever since his bypass surgery, he has had easy fatigability. He had not been particularly active and though he previously had planned to pursue virtual cardiac rehab, this has not occurred. He has had trouble sleeping  and is pending a sleep study, previously scheduled for 5/13. He reported home pulse oximeter readings over the past 10 to 14 days were in the 70s. Over the same period of time, he had been experiencing increasing dyspnea on exertion, lower extremity and abdominal swelling, orthopnea, early satiety, and 14 pound weight gain. He reported compliance with medications and did not think he was any more noncompliant with salt intake than he ever was previously. He continued to smoke cigarettes. In the setting of progressive symptoms and weight gain, his home Lasix dose was increased from 40 mg daily to 40 mg in the morning and 20 mg in the afternoon by his primary care provider, prior to seeing cardiology. As symptoms progressed, patient has been taking 40 mg daily and again at at bedtime resulting in significant nocturia. In our office, on 5/13, his oxygen saturation was noted to be 68% on room air upon arrival. He was sent to the ED in this setting.   Upon his arrival to the ED, he continued to be hypoxic with readings in the 70s to 80s, requiring BiPAP. BP ranged from the low 858I to 502D systolic. BNP elevated at 475. CXR showed mild vascular congestion with a small left pleural effusion. HS-Tn negative x 2, WBC 9.3, HGB 10.6, covid negative, BUN 38, SCr 1.92. EKG showed NSR, 77 bpm, right axis deviation, rare PVC, nonspecific st/t changes. In this ED, he received IV Lasix 40 mg, azithromycin, Duoneb, and Solu-medrol. Upon admission, he was continued on IV Lasix 40 mg twice daily along with nebulizers and steroids.  Overnight, he had worsening  shortness of breath and was transferred to the ICU.  He was continued on BiPAP with repeat chest x-ray showing stable vascular congestion, and no new focal infiltrate.  Documented urine output 1.5 L for the admission.  Documented weight trend 117.8 kg trending to 115.8 kg on 5/12. Labs: BUN/SCr 38/1.92-->39/1.96, potassium 4.6-->4.4, BNP 475-->411, HGB 10.6-->10.0, PLT  322-->284, pCO2 83, pO2 69.  Currently, notes some improvement in SOB. He remains on supplemental oxygen via nasal cannula at 10 L.    Past Medical History:  Diagnosis Date  . (HFpEF) heart failure with preserved ejection fraction ([place])    a. 07/2019 Echo: EF 60-65%. Mod LVH. Gr1 DD. No rwma. Nl RV size/fxn. Mildly dil LA.  Marland Kitchen ACE-inhibitor cough   . Anxiety   . Bronchitis 06/27/2016  . Carotid arterial disease ([place])    a. 09/2019 Carotid U/S: RICA 80-99%, LICA 8-33%; b. 02/2504 R CEA.  . CKD (chronic kidney disease), stage III   . COPD (chronic obstructive pulmonary disease) (HCC)    cath, mild inf. hypokinesis EF 49%, 100% ROA  . Coronary artery disease    a. 2001 Cath: LM 10, LAD 60p/m, D1 30, LCX 20p, RCA 167m/d-->Med Rx; b. 05/2017 Ex Mv: Ex time 5:46, antlat twi @ rest, fixed infarct w/ mild peri-infarct ischemia. EF 38%; c. 08/2019 Cath: LM 40ost, 70d, LAD 68m, LCX 66m, RCA 80-90p, 143m; d. 09/2019 CABG x4: LIMA->LAD, VG->RI, VG->OM, VG->RPDA.  . Diabetes mellitus type II   . Hyperlipidemia   . Hypertension   . MI (myocardial infarction) ([place]) age 35  . OA (osteoarthritis)    knee OA, injected 2012 by ortho  . PAF (paroxysmal atrial fibrillation) ([place])    a. 09/2019 post-op CABG-->[facility].  . Pneumonia   . PSVT (paroxysmal supraventricular tachycardia) ([place])    a. 08/2019 Zio: 189 brief runs of SVT (fastest 174 x 6 beats, longest 15 beats @ 107).  . Tobacco abuse     Past Surgical History:  Procedure Laterality Date  . CARDIAC CATHETERIZATION  05/06/2000   @ [facility]  . COLONOSCOPY WITH PROPOFOL N/A 04/20/2018   Procedure: COLONOSCOPY WITH PROPOFOL;  Surgeon: [name], MD;  Location[facility] ENDOSCOPY;  Service: Endoscopy;  Laterality: N/A;  . CORONARY ARTERY BYPASS GRAFT N/A 10/07/2019   Procedure: CORONARY ARTERY BYPASS GRAFTING (CABG) using LIMA to LAD; Endoscopic harvesting of left greater saphenous vein: SVG to RAMUS; SVG to OM1; SVG to PDA.;  Surgeon: Gaye Pollack, MD;   Location: [facility];  Service: Open Heart Surgery;  Laterality: N/A;  . ENDARTERECTOMY Right 10/07/2019   Procedure: ENDARTERECTOMY CAROTID;  Surgeon: Rosetta Posner, MD;  Location: [facility] OR;  Service: Vascular;  Laterality: Right;  . ENDOVEIN HARVEST OF GREATER SAPHENOUS VEIN Left 10/07/2019   Procedure: [place] Of Greater Saphenous Vein;  Surgeon: Gaye Pollack, MD;  Location: [facility] OR;  Service: Open Heart Surgery;  Laterality: Left;  . ESOPHAGOGASTRODUODENOSCOPY ENDOSCOPY    . KNEE ARTHROSCOPY  07/25/04   left  . [place]   right, open surgery- repair  . RIGHT/LEFT HEART CATH AND CORONARY ANGIOGRAPHY Bilateral 09/08/2019   Procedure: RIGHT/LEFT HEART CATH AND CORONARY ANGIOGRAPHY;  Surgeon: Minna Merritts, MD;  Location: [place] CV LAB;  Service: Cardiovascular;  Laterality: Bilateral;  . TEE WITHOUT CARDIOVERSION N/A 10/07/2019   Procedure: TRANSESOPHAGEAL ECHOCARDIOGRAM (TEE);  Surgeon: Gaye Pollack, MD;  Location: [facility];  Service: Open Heart Surgery;  Laterality: N/A;  . TOTAL KNEE ARTHROPLASTY Left  07/14/2016   Procedure: LEFT TOTAL KNEE ARTHROPLASTY;  Surgeon: Gaynelle Arabian, MD;  Location: [facility] ORS;  Service: Orthopedics;  Laterality: Left;  . TOTAL KNEE ARTHROPLASTY Right 07/13/2017   Procedure: RIGHT TOTAL KNEE ARTHROPLASTY;  Surgeon: Gaynelle Arabian, MD;  Location: [facility] ORS;  Service: Orthopedics;  Laterality: Right;     Home Meds: Prior to Admission medications   Medication Sig Start Date End Date Taking? Authorizing Provider  acetaminophen (TYLENOL) 500 MG tablet Take 1,000 mg by mouth every 8 (eight) hours as needed for mild pain or headache.    [provider]  ALPRAZolam Duanne Moron) 0.25 MG tablet Take 0.25 mg by mouth 2 (two) times daily as needed for anxiety.  08/03/19   [provider]  amLODipine (NORVASC) 10 MG tablet Take 1 tablet (10 mg total) by mouth daily. 10/26/19   Loel Dubonnet, NP  aspirin EC 81 MG tablet Take 1 tablet (81 mg total)  by mouth daily. 10/14/19   Gold, Wayne E, PA-C  atorvastatin (LIPITOR) 80 MG tablet Take 1 tablet (80 mg total) by mouth daily. 11/21/14   Tonia Ghent, MD  clopidogrel (PLAVIX) 75 MG tablet Take 75 mg by mouth daily.    [provider]  fenofibrate 160 MG tablet Take 1 tablet (160 mg total) by mouth daily. 01/11/15   Tonia Ghent, MD  furosemide (LASIX) 40 MG tablet Take 1 tablet (40 mg total) by mouth daily. 10/14/19   Gold, Wilder Glade, PA-C  glimepiride (AMARYL) 2 MG tablet Take one half tablet (1 mg) by mouth each morning Patient taking differently: Take 2 mg by mouth daily with breakfast.  01/11/15   Tonia Ghent, MD  glucose blood (ACCU-CHEK AVIVA PLUS) test strip Use to check blood sugar once daily or as needed.  Diagnosis:  E11.9   Non insulin-dependent. 12/14/14   Tonia Ghent, MD  Lancets (ACCU-CHEK MULTICLIX) lancets Use as instructed to check blood sugar once daily or as needed.  Diagnosis:  E11.9   Non insulin-dependent. 12/14/14   Tonia Ghent, MD  metFORMIN (GLUCOPHAGE) 1000 MG tablet Take 1 tablet (1,000 mg total) by mouth 2 (two) times daily. 01/11/15   Tonia Ghent, MD  metoprolol tartrate (LOPRESSOR) 25 MG tablet Take 1 tablet (25 mg total) by mouth 2 (two) times daily. 01/11/15   Tonia Ghent, MD  nicotine (NICODERM CQ - DOSED IN MG/24 HOURS) 21 mg/24hr patch Place 21 mg onto the skin daily as needed (nicotine dependence).     [provider]  pantoprazole (PROTONIX) 40 MG tablet Take 40 mg by mouth daily before breakfast. 09/01/19   [provider]  spironolactone (ALDACTONE) 25 MG tablet Take 1 tablet (25 mg total) by mouth daily. 10/17/19 01/15/20  Bensimhon, Shaune Pascal, MD  traMADol (ULTRAM) 50 MG tablet Take 1 tablet (50 mg total) by mouth every 6 (six) hours as needed for moderate pain. Patient not taking: Reported on 12/07/2019 11/02/19   Gaye Pollack, MD  traZODone (DESYREL) 50 MG tablet Take 1 tablet (50 mg total) by mouth at bedtime  as needed for up to 14 doses for sleep. Patient not taking: Reported on 12/07/2019 11/14/19   Gaye Pollack, MD    Inpatient Medications: Scheduled Meds: . amLODipine  10 mg Oral Daily  . aspirin EC  81 mg Oral Daily  . atorvastatin  80 mg Oral Daily  . Chlorhexidine Gluconate Cloth  6 each Topical Daily  . clopidogrel  75 mg  Oral Daily  . enoxaparin (LOVENOX) injection  40 mg Subcutaneous Q24H  . fenofibrate  160 mg Oral Daily  . furosemide  40 mg Intravenous Q12H  . insulin aspart  0-20 Units Subcutaneous TID WC  . ipratropium-albuterol  3 mL Nebulization Q6H  . methylPREDNISolone (SOLU-MEDROL) injection  60 mg Intravenous Daily  . metoprolol tartrate  25 mg Oral BID  . pantoprazole  40 mg Oral QAC breakfast  . sodium chloride flush  3 mL Intravenous Q12H  . spironolactone  25 mg Oral Daily   Continuous Infusions: . sodium chloride     PRN Meds: sodium chloride, acetaminophen, ALPRAZolam, nicotine, ondansetron (ZOFRAN) IV, sodium chloride flush, traZODone  Allergies:   Allergies  Allergen Reactions  . Ramipril Cough  . Mucinex D [Pseudoephedrine-Guaifenesin Er] Other (See Comments) and Hypertension    Elevated BP, insomnia    Social History:   Social History   Socioeconomic History  . Marital status: Married    Spouse name: Not on file  . Number of children: 3  . Years of education: Not on file  . Highest education level: Not on file  Occupational History  . Occupation: Engineer, manufacturing, Estate agent  Tobacco Use  . Smoking status: Former Smoker    Packs/day: 1.00    Years: 40.00    Pack years: 40.00    Quit date: 10/07/2019    Years since quitting: 0.1  . Smokeless tobacco: Never Used  . Tobacco comment: per patient wears a patch and has cut down on cigarettes/day (10/05/2019)  Substance and Sexual Activity  . Alcohol use: Yes    Alcohol/week: 0.0 standard drinks    Comment: 5-6 cocktails, 1 times a week  . Drug use: No  . Sexual activity: Not on file    Other Topics Concern  . Not on file  Social History Narrative   From [place].  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   Social Determinants of Health   Financial Resource Strain:   . Difficulty of Paying Living Expenses:   Food Insecurity:   . Worried About Charity fundraiser in the Last Year:   . Arboriculturist in the Last Year:   Transportation Needs:   . Film/video editor (Medical):   Marland Kitchen Lack of Transportation (Non-Medical):   Physical Activity:   . Days of Exercise per Week:   . Minutes of Exercise per Session:   Stress:   . Feeling of Stress :   Social Connections:   . Frequency of Communication with Friends and Family:   . Frequency of Social Gatherings with Friends and Family:   . Attends Religious Services:   . Active Member of Clubs or Organizations:   . Attends Archivist Meetings:   Marland Kitchen Marital Status:   Intimate Partner Violence:   . Fear of Current or Ex-Partner:   . Emotionally Abused:   Marland Kitchen Physically Abused:   . Sexually Abused:      Family History:  Family History  Problem Relation Age of Onset  . Heart disease Father        CAD, MI x 3  . Hypertension Father   . Alcohol abuse Father   . Diabetes Father   . Diabetes Sister   . Cancer Brother        lymphoma, in remission  . Colon cancer Brother   . Heart disease Sister        CABG x 3  . Prostate cancer  Neg Hx     ROS:  Review of Systems  Constitutional: Positive for malaise/fatigue. Negative for chills, diaphoresis, fever and weight loss.  HENT: Negative for congestion.   Eyes: Negative for discharge and redness.  Respiratory: Positive for shortness of breath. Negative for cough, sputum production and wheezing.   Cardiovascular: Positive for orthopnea and leg swelling. Negative for chest pain, palpitations, claudication and PND.  Gastrointestinal: Negative for abdominal pain, heartburn, melena, nausea and vomiting.  Musculoskeletal: Negative for falls and myalgias.   Skin: Negative for rash.  Neurological: Positive for weakness. Negative for dizziness, tingling, tremors, sensory change, speech change, focal weakness and loss of consciousness.  Endo/Heme/Allergies: Does not bruise/bleed easily.  Psychiatric/Behavioral: Negative for substance abuse. The patient is not nervous/anxious.   All other systems reviewed and are negative.     Physical Exam/Data:   Vitals:   12/07/19 1743 12/07/19 1932 12/07/19 2230 12/08/19 0700  BP: 116/76 (!) 167/69 136/70 (!) 100/53  Pulse: 76 89 73 71  Resp: 17 (!) 26 (!) 21 20  Temp: 98.4 F (36.9 C) 97.7 F (36.5 C) 97.6 F (36.4 C)   TempSrc: Oral Oral Axillary   SpO2: 92% 94% 100% 93%  Weight: 117.8 kg  115.8 kg   Height: 5\' 10"  (1.778 m)  5\' 10"  (1.778 m)     Intake/Output Summary (Last 24 hours) at 12/08/2019 0847 Last data filed at 12/08/2019 0600 Gross per 24 hour  Intake --  Output 1500 ml  Net -1500 ml   Filed Weights   12/07/19 1212 12/07/19 1743 12/07/19 2230  Weight: 120 kg 117.8 kg 115.8 kg   Body mass index is 36.63 kg/m.   Physical Exam: General: Well developed, well nourished, in no acute distress. Head: Normocephalic, atraumatic, sclera non-icteric, no xanthomas, nares without discharge.  Neck: Negative for carotid bruits. JVD elevated to the angle of the mandible. Lungs: Diminished breath sounds bilaterally with crackles along the bilateral bases. Remains on supplemental oxygen at 10 L via nasal cannula. Breathing is unlabored. Heart: RRR with S1 S2. No murmurs, rubs, or gallops appreciated. Abdomen: Soft, non-tender, mildly distended with normoactive bowel sounds. No hepatomegaly. No rebound/guarding. No obvious abdominal masses. Msk:  Strength and tone appear normal for age. Extremities: No clubbing or cyanosis. 2-3+ bilateral lower extremity pitting edema. Distal pedal pulses are 2+ and equal bilaterally. Neuro: Alert and oriented X 3. No facial asymmetry. No focal deficit. Moves all  extremities spontaneously. Psych:  Responds to questions appropriately with a normal affect.   EKG:  The EKG was personally reviewed and demonstrates: NSR, 77 bpm, right axis deviation, rare PVC, nonspecific st/t changes Telemetry:  Telemetry was personally reviewed and demonstrates: SR with rare PACs  Weights: Filed Weights   12/07/19 1212 12/07/19 1743 12/07/19 2230  Weight: 120 kg 117.8 kg 115.8 kg    Relevant CV Studies:  2D echo 07/2019: 1. Left ventricular ejection fraction, by visual estimation, is 60 to  65%. The left ventricle has normal function. There is moderately increased  left ventricular hypertrophy.  2. Left ventricular diastolic parameters are consistent with Grade I  diastolic dysfunction (impaired relaxation).  3. The left ventricle has no regional wall motion abnormalities.  4. Global right ventricle has normal systolic function.The right  ventricular size is normal. No increase in right ventricular wall  thickness.  5. Left atrial size was mildly dilated.  6. TR signal is inadequate for assessing pulmonary artery systolic  Pressure. __________  [facility] 07/2018: Coronary angiography:  Coronary  dominance: Right or codominant  Left mainstem:   Large vessel that bifurcates into the LAD and left circumflex, mild to moderate ostial left main disease 40%, 70% distal left main disease  Left anterior descending (LAD):   Large vessel that extends to the apical region, diagonal branch 2 of moderate size,  long region of  stenosis estimated 70%  Left circumflex (LCx):  Large vessel with OM branch 2, mid left circumflex estimated at 80%  Right coronary artery (RCA):  Right dominant vessel with PL and PDA, 80 to 90% proximal stenosis, occluded in the mid vessels with collaterals from left to right  Left ventriculography: Left ventricular systolic function was not measured given renal dysfunction creatinine 1.8, crossed for pressures  No significant aortic valve  stenosis   Right heart pressures RA 4 RV 61/7 PA 67/23 mean 42 Wedge pressure 43 Left ventricular end-diastolic pressure 10  Cardiac index 2.13 cardiac output 4.79  Final Conclusions:   Severe three-vessel native coronary disease including left main Elevated right heart pressures consistent with moderate to severe pulmonary hypertension\ Curiously wedge is markedly elevated with appropriate left ventricular end-diastolic pressure, etiology of disconnect is unclear.  No significant MR. Recent CT scan with no significant abnormality noted, normal ejection fraction on echo.  He has been on Lasix, overdiuresis contributing to worsening renal dysfunction -Severe underlying COPD from long history of smoking   Recommendations:  Case discussed with interventional cardiology, Concerned about distal left main disease Also mid circumflex, LAD Will refer to CT surgery, also advanced heart failure clinic to assist with right heart pressures --He does not want to stay in the hospital and be transferred to [facility] , with optimization of his condition Prefers to do this as an outpatient __________  Joshua Reach patch 09/2019: Normal sinus rhythm avg HR of 77 bpm  4 Ventricular Tachycardia runs occurred, the run with the fastest interval lasting 7 beats with a max rate of 190 bpm,  the longest lasting 6 beats with an avg rate of 146 bpm.   189 Supraventricular Tachycardia runs occurred, the run with the fastest interval lasting 6 beats with a max rate of 174 bpm, the longest lasting 15 beats with an avg rate of 107 bpm.  Idioventricular Rhythm was present.  Isolated SVEs were occasional (4.9%, 75441),SVE Couplets were rare (<1.0%, 5401), and SVE Triplets were rare (<1.0%, 862). Isolated VEs were rare (<1.0%, 14128), VE Couplets were rare (<1.0%, 430), and VE Triplets were rare (<1.0%, 3). Ventricular Bigeminy and Trigeminy were present.  No patient triggered events noted.   Laboratory  Data:  Chemistry Recent Labs  Lab 12/07/19 1218 12/08/19 0552  NA 136 138  K 4.6 4.4  CL 91* 92*  CO2 34* 37*  GLUCOSE 114* 139*  BUN 38* 39*  CREATININE 1.92* 1.96*  CALCIUM 9.1 8.7*  GFRNONAA 35* 34*  GFRAA 40* 39*  ANIONGAP 11 9    Recent Labs  Lab 12/07/19 1218  PROT 7.2  ALBUMIN 3.8  AST 19  ALT 22  ALKPHOS 25*  BILITOT 1.0   Hematology Recent Labs  Lab 12/07/19 1218 12/08/19 0552  WBC 9.3 7.5  RBC 4.86 4.63  HGB 10.6* 10.0*  HCT 37.1* 34.5*  MCV 76.3* 74.5*  MCH 21.8* 21.6*  MCHC 28.6* 29.0*  RDW 19.8* 19.9*  PLT 322 284   Cardiac EnzymesNo results for input(s): TROPONINI in the last 168 hours. No results for input(s): TROPIPOC in the last 168 hours.  BNP Recent Labs  Lab 12/07/19  1218 12/08/19 0552  BNP 475.0* 411.0*    DDimer No results for input(s): DDIMER in the last 168 hours.  Radiology/Studies:  DG Chest 1 View  Result Date: 12/07/2019 IMPRESSION: Stable vascular congestion.  No new focal abnormality is noted. Electronically Signed   By: Inez Catalina M.D.   On: 12/07/2019 21:21   DG Chest Port 1 View  Result Date: 12/07/2019 IMPRESSION: Mild vascular congestion and small left effusion. Negative for edema. Electronically Signed   By: Franchot Gallo M.D.   On: 12/07/2019 12:58    Assessment and Plan:   1.  Acute hypoxic and hypercapnic respiratory failure: -Currently on 10 L via nasal cannula with saturations in the upper 80s, wean as tolerated per primary service -In the setting of CHF exacerbation and likely AECOPD -Management as below  -Consider VQ scan to evaluate for PE, unable to do CTA chest with CKD  2.  Acute on chronic HFpEF: -He remains volume up -Continue IV Lasix 40 mg bid with KCl repletion, may need to titrate, will discuss with MD -Echo pending -CHF education -Strict I/O and daily weights   3.  CAD status post recent four-vessel CABG: -No symptoms of angina -Continue DAPT with ASA and Plavix as well as  metoprolol and Lipitor  -Recommend cardiac rehab as an outpatient  -Echo pending -No plans for ischemic evaluation at this time  4.  Postoperative A. fib: -Maintaining sinus rhythm -Not on long term OAC -Metoprolol   5.  CKD stage III: -Renal function stable -Monitor with diuresis   6.  Carotid artery disease status post recent right-sided CEA: -Stable -ASA -Lipitor -Outpatient follow up  7.  HTN: -Blood pressure well controlled to slightly soft -Hold amlodipine to allow for more BP room for diuresis  -Continue IV Lasix as above as well as metoprolol   8.  HLD: -LDL 39 from 09/2019 -Lipitor   9. AECOPD: -Nebs and steroids per primary service, wean as able  10. DM2: -Per primary service  -A1c 7.4   For questions or updates, please contact [place] Please consult [email or web address] for contact info under Cardiology/STEMI.   Signed, Christell Faith, PA-C [place] Pager: [phone] 12/08/2019, 8:47 AM

## 2019-12-08 NOTE — Consult Note (Signed)
Name: Joshua Roy MRN: [id number] DOB: 1950-08-31    ADMISSION DATE:  12/07/2019 CONSULTATION DATE:  12/08/2019  REFERRING MD :  Rufina Falco, NP  CHIEF COMPLAINT:  Shortness of Breath  BRIEF PATIENT DESCRIPTION:  69 year old male admitted 12/07/2019 with acute hypoxic hypercapnic respiratory failure secondary to acute on chronic HFpEF and presumed Obstructive Sleep Apnea.  Patient also with COPD, without evidence of acute exacerbation.  Late in the evening on 5/12 became increasingly somnolent, ABG with worsening hypercapnia.  Transferred to stepdown unit for BiPAP.  SIGNIFICANT EVENTS  5/12: Admission to [place] unit 5/12: Increasingly somnolent, ABG with worsening hypercapnia  5/12: Placed on BiPAP and transferred to stepdown unit 5/13: PCCM consulted  STUDIES:  5/12: CXR>>Mild cardiac enlargement. Mild vascular congestion without edema. Small left effusion. Negative for pneumonia. Mild atelectasis in the Bases. 5/12: CXR>>Cardiac shadow is stable. Aortic calcifications are again noted and stable. Lungs are well aerated bilaterally. Mild vascular congestion is again seen and stable. No new focal infiltrate is seen. No bony abnormality is noted.  CULTURES: SARS-CoV-2 PCR 5/12>> negative  ANTIBIOTICS: Azithromycin x1 dose 5/12  HISTORY OF PRESENT ILLNESS:   Joshua Roy is a 69 year old male with a past medical history significant for HFpEF, COPD, CAD status post CABG in March 2021, post-op atrial fibrillation, hypertension, carotid artery stenosis status post endarterectomy, diabetes mellitus, and chronic kidney disease stage III who presents to [facility] ED on 12/07/2019 due to Hypoxia (O2 saturations 68% on room air) while at his outpatient Cardiology appointment today.  Pt is currently somnolent, therefore history is obtained from ED and nursing notes.  Per notes, he reported dyspnea on exertion, bilateral lower extremity edema, increasing abdominal girth,  orthopnea, and a 14 pound weight gain over the past 10 to 14 days.  He reported compliance with medications, however not compliant with diet, and continues to smoke cigarettes. His PCP recently increased his diuretic from 40 mg daily, to 40 mg in the morning and 20 mg in the evening.  He denied chest pain, abdominal pain, nausea, vomiting, palpitations, cough, fever, chills, or diarrhea.    Upon arrival to the ED, he was initially placed on nasal cannula.  However given his work of breathing, hypoxia, and drop in O2 sats when falling asleep, he was placed on BiPAP.  Initial work-up in the ED revealed BUN 38, creatinine 1.92, BNP 475, high-sensitivity troponin 11, hemoglobin 10.6, glucose 114.  Venous blood gas with Hypercapnia (pH 7.27 /PCO2 85 /PO2 38/bicarb 39).  Chest x-ray with mild vascular congestion and small left pleural effusion, however negative for edema.  EKG with sinus rhythm and PVCs. His COVID-19 PCR is negative.  He was admitted to the [place] unit by the hospitalist for further work-up and treatment of acute hypoxic hypercapnic respiratory failure secondary to acute on chronic HFpEF and presumed OSA.  Patient also with history of COPD, but without acute exacerbation.  Later in the evening on 12/07/2019, patient became increasingly somnolent after being weaned off BiPAP for a couple of hours.  ABG with worsening respiratory acidosis and hypoxia (pH 7.27 /PCO2 83 /PO2 69 /bicarb 38.1).  Patient has not received any narcotics on medication review.  He was again placed on BiPAP and transferred to stepdown unit.  PCCM was consulted for further management of acute hypoxic hypercapnic respiratory failure due to acute on chronic decompensated HFpEF and presumed OSA requiring BiPAP.  PAST MEDICAL HISTORY :   has a past medical history of (HFpEF) heart failure with preserved  ejection fraction ([place]), ACE-inhibitor cough, Anxiety, Bronchitis (06/27/2016), Carotid arterial disease ([place]), CKD (chronic  kidney disease), stage III, COPD (chronic obstructive pulmonary disease) ([place]), Coronary artery disease, Diabetes mellitus type II, Hyperlipidemia, Hypertension, MI (myocardial infarction) ([place]) (age 20), OA (osteoarthritis), PAF (paroxysmal atrial fibrillation) ([place]), Pneumonia, PSVT (paroxysmal supraventricular tachycardia) ([place]), and Tobacco abuse.  has a past surgical history that includes Knee surgery (1969); Knee arthroscopy (07/25/04); Cardiac catheterization (05/06/2000); Esophagogastroduodenoscopy endoscopy; Total knee arthroplasty (Left, 07/14/2016); Total knee arthroplasty (Right, 07/13/2017); Colonoscopy with propofol (N/A, 04/20/2018); RIGHT/LEFT HEART CATH AND CORONARY ANGIOGRAPHY (Bilateral, 09/08/2019); Coronary artery bypass graft (N/A, 10/07/2019); TEE without cardioversion (N/A, 10/07/2019); Endoharvest vein of greater saphenous vein (Left, 10/07/2019); and Endarterectomy (Right, 10/07/2019). Prior to Admission medications   Medication Sig Start Date End Date Taking? Authorizing Provider  acetaminophen (TYLENOL) 500 MG tablet Take 1,000 mg by mouth every 8 (eight) hours as needed for mild pain or headache.    [provider]  ALPRAZolam Duanne Moron) 0.25 MG tablet Take 0.25 mg by mouth 2 (two) times daily as needed for anxiety.  08/03/19   [provider]  amLODipine (NORVASC) 10 MG tablet Take 1 tablet (10 mg total) by mouth daily. 10/26/19   Loel Dubonnet, NP  aspirin EC 81 MG tablet Take 1 tablet (81 mg total) by mouth daily. 10/14/19   Gold, Wayne E, PA-C  atorvastatin (LIPITOR) 80 MG tablet Take 1 tablet (80 mg total) by mouth daily. 11/21/14   Tonia Ghent, MD  clopidogrel (PLAVIX) 75 MG tablet Take 75 mg by mouth daily.    [provider]  fenofibrate 160 MG tablet Take 1 tablet (160 mg total) by mouth daily. 01/11/15   Tonia Ghent, MD  furosemide (LASIX) 40 MG tablet Take 1 tablet (40 mg total) by mouth daily. 10/14/19   Gold, Wilder Glade, PA-C  glimepiride  (AMARYL) 2 MG tablet Take one half tablet (1 mg) by mouth each morning Patient taking differently: Take 2 mg by mouth daily with breakfast.  01/11/15   Tonia Ghent, MD  glucose blood (ACCU-CHEK AVIVA PLUS) test strip Use to check blood sugar once daily or as needed.  Diagnosis:  E11.9   Non insulin-dependent. 12/14/14   Tonia Ghent, MD  Lancets (ACCU-CHEK MULTICLIX) lancets Use as instructed to check blood sugar once daily or as needed.  Diagnosis:  E11.9   Non insulin-dependent. 12/14/14   Tonia Ghent, MD  metFORMIN (GLUCOPHAGE) 1000 MG tablet Take 1 tablet (1,000 mg total) by mouth 2 (two) times daily. 01/11/15   Tonia Ghent, MD  metoprolol tartrate (LOPRESSOR) 25 MG tablet Take 1 tablet (25 mg total) by mouth 2 (two) times daily. 01/11/15   Tonia Ghent, MD  nicotine (NICODERM CQ - DOSED IN MG/24 HOURS) 21 mg/24hr patch Place 21 mg onto the skin daily as needed (nicotine dependence).     [provider]  pantoprazole (PROTONIX) 40 MG tablet Take 40 mg by mouth daily before breakfast. 09/01/19   [provider]  spironolactone (ALDACTONE) 25 MG tablet Take 1 tablet (25 mg total) by mouth daily. 10/17/19 01/15/20  Bensimhon, Shaune Pascal, MD  traMADol (ULTRAM) 50 MG tablet Take 1 tablet (50 mg total) by mouth every 6 (six) hours as needed for moderate pain. Patient not taking: Reported on 12/07/2019 11/02/19   Gaye Pollack, MD  traZODone (DESYREL) 50 MG tablet Take 1 tablet (50 mg total) by mouth at bedtime as needed for up to 14 doses  for sleep. Patient not taking: Reported on 12/07/2019 11/14/19   Gaye Pollack, MD   Allergies  Allergen Reactions  . Ramipril Cough  . Mucinex D [Pseudoephedrine-Guaifenesin Er] Other (See Comments) and Hypertension    Elevated BP, insomnia    FAMILY HISTORY:  family history includes Alcohol abuse in his father; Cancer in his brother; Colon cancer in his brother; Diabetes in his father and sister; Heart disease in his father and  sister; Hypertension in his father. SOCIAL HISTORY:  reports that he quit smoking about 2 months ago. He has a 40.00 pack-year smoking history. He has never used smokeless tobacco. He reports current alcohol use. He reports that he does not use drugs.   COVID-19 DISASTER DECLARATION:  FULL CONTACT PHYSICAL EXAMINATION WAS NOT POSSIBLE DUE TO TREATMENT OF COVID-19 AND  CONSERVATION OF PERSONAL PROTECTIVE EQUIPMENT, LIMITED EXAM FINDINGS INCLUDE-  Patient assessed or the symptoms described in the history of present illness.  In the context of the Global COVID-19 pandemic, which necessitated consideration that the patient might be at risk for infection with the SARS-CoV-2 virus that causes COVID-19, Institutional protocols and algorithms that pertain to the evaluation of patients at risk for COVID-19 are in a state of rapid change based on information released by regulatory bodies including the CDC and federal and state organizations. These policies and algorithms were followed during the patient's care while in hospital.  REVIEW OF SYSTEMS:   Unable to assess due to altered mental status  SUBJECTIVE:  Unable to assess due to altered mental status  VITAL SIGNS: Temp:  [97.6 F (36.4 C)-98.4 F (36.9 C)] 97.6 F (36.4 C) (05/12 2230) Pulse Rate:  [67-89] 73 (05/12 2230) Resp:  [13-27] 21 (05/12 2230) BP: (106-167)/(39-88) 136/70 (05/12 2230) SpO2:  [68 %-100 %] 100 % (05/12 2230) FiO2 (%):  [45 %] 45 % (05/12 2230) Weight:  [115.8 kg-120 kg] 115.8 kg (05/12 2230)  PHYSICAL EXAMINATION: General: Acutely ill-appearing male, laying in bed, on BiPAP, no acute distress Neuro: Somnolent, will arouse to voice briefly, withdraws to pain HEENT: Atraumatic, normocephalic, neck supple, no JVD Cardiovascular: Regular rate and rhythm, S1-S2, no murmurs, rubs, gallops Lungs: Clear diminished to auscultation bilaterally, BiPAP assisted, even, nonlabored Abdomen: Soft, nontender, nondistended, no  guarding or rebound tenderness, bowel sounds positive x4 Musculoskeletal: No deformities, 2+ edema bilateral lower extremities, no cyanosis or clubbing Skin: Warm and dry.  No obvious rashes, lesions, ulcerations  Recent Labs  Lab 12/07/19 1218  NA 136  K 4.6  CL 91*  CO2 34*  BUN 38*  CREATININE 1.92*  GLUCOSE 114*   Recent Labs  Lab 12/07/19 1218  HGB 10.6*  HCT 37.1*  WBC 9.3  PLT 322   DG Chest 1 View  Result Date: 12/07/2019 CLINICAL DATA:  Increasing shortness of breath EXAM: CHEST  1 VIEW COMPARISON:  Film from earlier in the same day. FINDINGS: Cardiac shadow is stable. Aortic calcifications are again noted and stable. Lungs are well aerated bilaterally. Mild vascular congestion is again seen and stable. No new focal infiltrate is seen. No bony abnormality is noted. IMPRESSION: Stable vascular congestion.  No new focal abnormality is noted. Electronically Signed   By: Inez Catalina M.D.   On: 12/07/2019 21:21   DG Chest Port 1 View  Result Date: 12/07/2019 CLINICAL DATA:  Hypoxemia EXAM: PORTABLE CHEST 1 VIEW COMPARISON:  11/02/2019 FINDINGS: Mild cardiac enlargement. Mild vascular congestion without edema. Small left effusion. Negative for pneumonia. Mild atelectasis in the bases.  IMPRESSION: Mild vascular congestion and small left effusion. Negative for edema. Electronically Signed   By: Franchot Gallo M.D.   On: 12/07/2019 12:58    ASSESSMENT / PLAN:  -Acute Hypoxic Hypercapnic Respiratory Failure secondary to Acute Decompensated HFpEF & presumed Obstructive Sleep Apnea (previously scheduled for sleep study 12/08/19) -COPD without evidence of acute exacerbation -Supplemental O2 as needed to maintain O2 saturations 88 to 92% -BiPAP, wean as tolerated -High risk for intubation -Follow intermittent ABG and chest x-ray as needed -Repeat CXR on 5/12 with stable vascular congestion; no new focal infiltrate or hyperinflation noted; currently no wheezing noted upon  auscultation -IV Lasix as blood pressure and renal function permits; currently on Lasix 40 mg IV BID -As needed bronchodilators -Encourage smoking cessation  -Acute on chronic HFpEF (last known EF 60 to 65%) -Hypertension Hx: CAD status post CABG in March 2021, Post-op A-fib, HLD  -Continuous cardiac monitoring -Maintain MAP greater than 65 -IV Lasix as blood pressure and renal function permits; currently on Lasix 40 mg IV BID -Continue Norvasc, metoprolol. -Cardiology following, appreciate input -Repeat 2D Echocardiogram  -Acute Metabolic Encephalopathy due to Hypercapnia -Provide supportive care -BiPAP to treat Hypercapnia  -CKD Stage III -Monitor I&O's / urinary output -Follow BMP -Ensure adequate renal perfusion -Avoid nephrotoxic agents as able -Replace electrolytes as indicated  -Diabetes mellitus -CBGs -Sliding scale insulin -Follow ICU hyper/hypoglycemia protocol -Hold home Metformin & Amaryl         BEST PRACTICES: DISPOSITION: Stepdown GOALS OF CARE: Full Code VTE PROPHYLAXIS: SQ Lovenox CONSULTS: Hospitalist (primary service), Cardiology UPDATES: No family present at bedside during NP rounds 12/08/19    Darel Hong, AGACNP-BC [facility] Pager: [phone]  12/08/2019, 12:01 AM

## 2019-12-09 DIAGNOSIS — I10 Essential (primary) hypertension: Secondary | ICD-10-CM

## 2019-12-09 LAB — BASIC METABOLIC PANEL
Anion gap: 9 (ref 5–15)
BUN: 50 mg/dL — ABNORMAL HIGH (ref 8–23)
CO2: 38 mmol/L — ABNORMAL HIGH (ref 22–32)
Calcium: 8.5 mg/dL — ABNORMAL LOW (ref 8.9–10.3)
Chloride: 90 mmol/L — ABNORMAL LOW (ref 98–111)
Creatinine, Ser: 1.91 mg/dL — ABNORMAL HIGH (ref 0.61–1.24)
GFR calc Af Amer: 40 mL/min — ABNORMAL LOW (ref >60)
GFR calc non Af Amer: 35 mL/min — ABNORMAL LOW (ref >60)
Glucose, Bld: 84 mg/dL (ref 70–99)
Potassium: 4.6 mmol/L (ref 3.5–5.1)
Sodium: 137 mmol/L (ref 135–145)

## 2019-12-09 LAB — GLUCOSE, CAPILLARY
Glucose-Capillary: 113 mg/dL — ABNORMAL HIGH (ref 70–99)
Glucose-Capillary: 227 mg/dL — ABNORMAL HIGH (ref 70–99)
Glucose-Capillary: 227 mg/dL — ABNORMAL HIGH (ref 70–99)
Glucose-Capillary: 267 mg/dL — ABNORMAL HIGH (ref 70–99)
Glucose-Capillary: 47 mg/dL — ABNORMAL LOW (ref 70–99)
Glucose-Capillary: 47 mg/dL — ABNORMAL LOW (ref 70–99)
Glucose-Capillary: 94 mg/dL (ref 70–99)

## 2019-12-09 LAB — CBC
HCT: 32.8 % — ABNORMAL LOW (ref 39.0–52.0)
Hemoglobin: 9.4 g/dL — ABNORMAL LOW (ref 13.0–17.0)
MCH: 21.4 pg — ABNORMAL LOW (ref 26.0–34.0)
MCHC: 28.7 g/dL — ABNORMAL LOW (ref 30.0–36.0)
MCV: 74.7 fL — ABNORMAL LOW (ref 80.0–100.0)
Platelets: 287 10*3/uL (ref 150–400)
RBC: 4.39 MIL/uL (ref 4.22–5.81)
RDW: 19.7 % — ABNORMAL HIGH (ref 11.5–15.5)
WBC: 8.3 10*3/uL (ref 4.0–10.5)
nRBC: 0.4 % — ABNORMAL HIGH (ref 0.0–0.2)

## 2019-12-09 LAB — PROCALCITONIN: Procalcitonin: <0.1 ng/mL

## 2019-12-09 LAB — MAGNESIUM: Magnesium: 2.2 mg/dL (ref 1.7–2.4)

## 2019-12-09 MED ORDER — INSULIN ASPART 100 UNIT/ML ~~LOC~~ SOLN
0.0000 [IU] | Freq: Three times a day (TID) | SUBCUTANEOUS | Status: DC
  Administered 2019-12-09: 5 [IU] via SUBCUTANEOUS
  Filled 2019-12-09: qty 1

## 2019-12-09 MED ORDER — IPRATROPIUM-ALBUTEROL 0.5-2.5 (3) MG/3ML IN SOLN
3.0000 mL | RESPIRATORY_TRACT | Status: DC
  Administered 2019-12-09 – 2019-12-13 (×27): 3 mL via RESPIRATORY_TRACT
  Filled 2019-12-09 (×27): qty 3

## 2019-12-09 MED ORDER — SODIUM CHLORIDE 0.9 % IV SOLN
500.0000 mg | Freq: Once | INTRAVENOUS | Status: AC
  Administered 2019-12-09: 500 mg via INTRAVENOUS
  Filled 2019-12-09: qty 500

## 2019-12-09 MED ORDER — TRAZODONE HCL 50 MG PO TABS
50.0000 mg | ORAL_TABLET | Freq: Every evening | ORAL | Status: DC | PRN
  Administered 2019-12-09 – 2019-12-13 (×4): 50 mg via ORAL
  Filled 2019-12-09 (×4): qty 1

## 2019-12-09 MED ORDER — FUROSEMIDE 10 MG/ML IJ SOLN
40.0000 mg | Freq: Two times a day (BID) | INTRAMUSCULAR | Status: DC
  Administered 2019-12-09 – 2019-12-10 (×3): 40 mg via INTRAVENOUS
  Filled 2019-12-09 (×3): qty 4

## 2019-12-09 MED ORDER — DIPHENHYDRAMINE HCL 50 MG/ML IJ SOLN
25.0000 mg | Freq: Once | INTRAMUSCULAR | Status: AC
  Administered 2019-12-09: 25 mg via INTRAVENOUS
  Filled 2019-12-09: qty 1

## 2019-12-09 MED ORDER — INSULIN ASPART 100 UNIT/ML ~~LOC~~ SOLN
0.0000 [IU] | Freq: Three times a day (TID) | SUBCUTANEOUS | Status: DC
  Administered 2019-12-10: 5 [IU] via SUBCUTANEOUS
  Filled 2019-12-09: qty 1

## 2019-12-09 MED ORDER — INSULIN ASPART 100 UNIT/ML ~~LOC~~ SOLN
0.0000 [IU] | Freq: Every day | SUBCUTANEOUS | Status: DC
  Administered 2019-12-09: 3 [IU] via SUBCUTANEOUS
  Filled 2019-12-09: qty 1

## 2019-12-09 MED ORDER — INSULIN ASPART 100 UNIT/ML ~~LOC~~ SOLN
0.0000 [IU] | Freq: Every day | SUBCUTANEOUS | Status: DC
Start: 2019-12-09 — End: 2019-12-09

## 2019-12-09 MED ORDER — METHYLPREDNISOLONE SODIUM SUCC 40 MG IJ SOLR
40.0000 mg | Freq: Two times a day (BID) | INTRAMUSCULAR | Status: DC
  Administered 2019-12-09 – 2019-12-12 (×8): 40 mg via INTRAVENOUS
  Filled 2019-12-09 (×8): qty 1

## 2019-12-09 MED ORDER — TRAZODONE HCL 100 MG PO TABS: 100.0000 mg | ORAL_TABLET | Freq: Every evening | ORAL | Status: DC | PRN

## 2019-12-09 NOTE — Plan of Care (Signed)
PMT note:  Consult noted. Patient is not in room, and per staff is down in a procedure. Unable to complete GOC at this time. Will reattempt next week as palliative medicine team is not available Saturday or Sunday.

## 2019-12-09 NOTE — Progress Notes (Signed)
PROGRESS NOTE    Joshua Roy  JKD:[id number] DOB: [birth year] DOA: 12/07/2019 PCP: [name], MD    Assessment & Plan:   Principal Problem:   Acute on chronic diastolic CHF (congestive heart failure) ([place]) Active Problems:   DISORDER, TOBACCO USE   Essential hypertension   COPD (chronic obstructive pulmonary disease) ([place])   CAD (coronary artery disease)   Morbid obesity ([place])   Acute on chronic respiratory failure with hypercapnia ([place])    Joshua Roy is a 69 y.o. male with medical history significant for coronary artery disease status post recent four-vessel CABG, carotid disease status post recent carotid endarterectomy, history of A. 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 Patient complains of easy fatigability as well as trouble sleeping.  He also complains of dyspnea with exertion, bilateral lower extremity swelling, increased abdominal girth, orthopnea and a 14 pound weight gain since his last hospitalization.  He reports compliance with his medications but does not think he has been compliant with his diet.  He continues to smoke cigarettes.  Patient has had an increase in the dose of his diuretic from 40 mg daily to 40 mg in the morning and 20 in the afternoon by his primary care provider because of his significant weight gain. While in the office he was noted to have a pulse oximetry of 68% on room air, patient is not on home oxygen.  He was sent to the emergency room for further evaluation.   Acute on chronic diastolic dysfunction CHF Last known LVEF of 60 - 65% Patient presents for evaluation of exertional SOB, lower extremity swelling and 14lb weight gain --Cardiology consulted PLAN: --continue Lasix 40 BID with close monitoring of renal function --continue  Aldactone  Acute on chronic respiratory failure with hypercapnia Likely OSA Patient was hypoxic in the field with pulse oximetry in the low 70s and also had increased work of breathing Acute worsening secondary to acute CHF exacerbation He required noninvasive mechanical ventilation to reduce his work of breathing and improve oxygenation Arterial blood gas showed uncompensated respiratory acidosis with hypercarbia --Continue BiPAP nightly   CAD S/P CABG Continue aspirin, statins, beta-blocker and Plavix   Hypertension Blood pressure stable Continue amlodipine and metoprolol   Nicotine dependence Smoking cessation has been discussed with patient in detail Continue nicotine transdermal patch   Diabetes mellitus Hypoglycemia --Had low BG of 47 previously, however, BG started to trend up. --SSI TID (no bed-time coverage, unless eating a meal at bedtime)   COPD Since pt is not improving with diuretic, will treat for exacerbation. --Start solumedrol 40 mg BID --Start azithromycin --DuoNeb q4h scheduled  Paroxysmal atrial fibrillation Maintaining normal sinus rhythm Continue metoprolol twice daily   DVT prophylaxis: Lovenox SQ Code Status: Full code  Family Communication:  Status is: inpatient Dispo:   The patient is from: home Anticipated d/c is to: home Anticipated d/c date is: unclear Patient currently is not medically stable to d/c due to: still on 12L high flow, needing IV diuretic.   Subjective and Interval History:  O2 requirement continued to increase.  Pt reported not sleeping well.  No fever, chest pain, abdominal pain, N/V/D, dysuria.      Objective: Vitals:   12/09/19 1430 12/09/19 1455 12/09/19 1457 12/09/19 1505  BP: 112/63     Pulse: 70  Resp: (!) 24     Temp:      TempSrc:      SpO2:  90% 92% 92%  Weight:      Height:        Intake/Output Summary (Last 24 hours) at 12/09/2019 1620 Last data filed at 12/09/2019 1421 Gross per  24 hour  Intake 850 ml  Output 2000 ml  Net -1150 ml   Filed Weights   12/07/19 1743 12/07/19 2230 12/09/19 0500  Weight: 117.8 kg 115.8 kg 117.9 kg    Examination:   Constitutional: NAD, AAOx3 HEENT: conjunctivae and lids normal, EOMI CV: RRR no M,R,G. Distal pulses +2.  No cyanosis.   RESP: Coarse rhonchi, on 12L, sating 89% GI: +BS, NTND Extremities: 2-3+ pitting edema up to knees in BLE, improved from prior SKIN: warm, dry and intact Neuro: II - XII grossly intact.  Sensation intact   Data Reviewed: I have personally reviewed following labs and imaging studies  CBC: Recent Labs  Lab 12/07/19 1218 12/08/19 0552 12/09/19 0336  WBC 9.3 7.5 8.3  NEUTROABS 7.0  --   --   HGB 10.6* 10.0* 9.4*  HCT 37.1* 34.5* 32.8*  MCV 76.3* 74.5* 74.7*  PLT 322 284 229   Basic Metabolic Panel: Recent Labs  Lab 12/07/19 1218 12/08/19 0552 12/09/19 0336  NA 136 138 137  K 4.6 4.4 4.6  CL 91* 92* 90*  CO2 34* 37* 38*  GLUCOSE 114* 139* 84  BUN 38* 39* 50*  CREATININE 1.92* 1.96* 1.91*  CALCIUM 9.1 8.7* 8.5*  MG  --   --  2.2   GFR: Estimated Creatinine Clearance: 46.3 mL/min (A) (by C-G formula based on SCr of 1.91 mg/dL (H)). Liver Function Tests: Recent Labs  Lab 12/07/19 1218  AST 19  ALT 22  ALKPHOS 25*  BILITOT 1.0  PROT 7.2  ALBUMIN 3.8   No results for input(s): LIPASE, AMYLASE in the last 168 hours. No results for input(s): AMMONIA in the last 168 hours. Coagulation Profile: Recent Labs  Lab 12/07/19 1218  INR 1.2   Cardiac Enzymes: No results for input(s): CKTOTAL, CKMB, CKMBINDEX, TROPONINI in the last 168 hours. BNP (last 3 results) No results for input(s): PROBNP in the last 8760 hours. HbA1C: Recent Labs    12/07/19 1811  HGBA1C 7.4*   CBG: Recent Labs  Lab 12/09/19 0747 12/09/19 0825 12/09/19 0940 12/09/19 1138 12/09/19 1548  GLUCAP 47* 47* 94 113* 227*   Lipid Profile: No results for input(s): CHOL, HDL, LDLCALC, TRIG, CHOLHDL,  LDLDIRECT in the last 72 hours. Thyroid Function Tests: No results for input(s): TSH, T4TOTAL, FREET4, T3FREE, THYROIDAB in the last 72 hours. Anemia Panel: No results for input(s): VITAMINB12, FOLATE, FERRITIN, TIBC, IRON, RETICCTPCT in the last 72 hours. Sepsis Labs: Recent Labs  Lab 12/09/19 0336  PROCALCITON <0.10    Recent Results (from the past 240 hour(s))  SARS Coronavirus 2 by RT PCR (hospital order, performed in [facility] hospital lab) Nasopharyngeal Nasopharyngeal Swab     Status: None   Collection Time: 12/07/19 12:18 PM   Specimen: Nasopharyngeal Swab  Result Value Ref Range Status   SARS Coronavirus 2 NEGATIVE NEGATIVE Final    Comment: (NOTE) SARS-CoV-2 target nucleic acids are NOT DETECTED. The SARS-CoV-2 RNA is generally detectable in upper and lower respiratory specimens during the acute phase of infection. The lowest concentration of SARS-CoV-2 viral copies this assay can detect is 250 copies / mL. A negative result does not preclude SARS-CoV-2  infection and should not be used as the sole basis for treatment or other patient management decisions.  A negative result may occur with improper specimen collection / handling, submission of specimen other than nasopharyngeal swab, presence of viral mutation(s) within the areas targeted by this assay, and inadequate number of viral copies (<250 copies / mL). A negative result must be combined with clinical observations, patient history, and epidemiological information. Fact Sheet for Patients:   StrictlyIdeas.no Fact Sheet for Healthcare Providers: BankingDealers.co.za This test is not yet approved or cleared  by the [place] FDA and has been authorized for detection and/or diagnosis of SARS-CoV-2 by FDA under an Emergency Use Authorization (EUA).  This EUA will remain in effect (meaning this test can be used) for the duration of the COVID-19 declaration under  Section 564(b)(1) of the Act, 21 U.S.C. section 360bbb-3(b)(1), unless the authorization is terminated or revoked sooner. Performed at [facility], [address], [place], [place] [address]       Radiology Studies: DG Chest 1 View  Result Date: 12/07/2019 CLINICAL DATA:  Increasing shortness of breath EXAM: CHEST  1 VIEW COMPARISON:  Film from earlier in the same day. FINDINGS: Cardiac shadow is stable. Aortic calcifications are again noted and stable. Lungs are well aerated bilaterally. Mild vascular congestion is again seen and stable. No new focal infiltrate is seen. No bony abnormality is noted. IMPRESSION: Stable vascular congestion.  No new focal abnormality is noted. Electronically Signed   By: Inez Catalina M.D.   On: 12/07/2019 21:21   ECHOCARDIOGRAM COMPLETE  Result Date: 12/08/2019    ECHOCARDIOGRAM REPORT   Patient Name:   VIRAAJ VORNDRAN [facility] Date of Exam: 12/08/2019 Medical Rec #:  [id number]            Height:       70.0 in Accession #:    [id number]           Weight:       255.3 lb Date of Birth:  [birth year]           BSA:          2.315 m Patient Age:    69 years             BP:           119/61 mmHg Patient Gender: M                    HR:           81 bpm. Exam Location:  [facility] Procedure: 2D Echo, Cardiac Doppler and Color Doppler Indications:     CHF- acute diastolic 366.29  History:         Patient has prior history of Echocardiogram examinations, most                  recent 10/07/2019. Prior CABG, COPD; Risk Factors:Hypertension.                  MI, PAF.  Sonographer:     Sherrie Sport RDCS (AE) Referring Phys:  [id number] Bradly Bienenstock Diagnosing Phys: Ida Rogue MD  Sonographer Comments: No apical window and no subcostal window. Image acquisition challenging due to COPD. IMPRESSIONS  1. Left ventricular ejection fraction, by estimation, is 55 to 60%. The left ventricle has normal function. The left ventricle has no regional wall motion abnormalities. There is mild  left ventricular hypertrophy. Left ventricular diastolic parameters are indeterminate.  2. Right ventricular  systolic function is normal. The right ventricular size is normal. Tricuspid regurgitation signal is inadequate for assessing PA pressure.  3. Left atrial size was moderately dilated.  4. Challenging image quality FINDINGS  Left Ventricle: Left ventricular ejection fraction, by estimation, is 55 to 60%. The left ventricle has normal function. The left ventricle has no regional wall motion abnormalities. The left ventricular internal cavity size was normal in size. There is  mild left ventricular hypertrophy. Left ventricular diastolic parameters are indeterminate. Right Ventricle: The right ventricular size is normal. No increase in right ventricular wall thickness. Right ventricular systolic function is normal. Tricuspid regurgitation signal is inadequate for assessing PA pressure. Left Atrium: Left atrial size was moderately dilated. Right Atrium: Right atrial size was normal in size. Pericardium: A small pericardial effusion is present. Mitral Valve: The mitral valve is normal in structure. Normal mobility of the mitral valve leaflets. No evidence of mitral valve regurgitation. No evidence of mitral valve stenosis. Tricuspid Valve: The tricuspid valve is normal in structure. Tricuspid valve regurgitation is mild . No evidence of tricuspid stenosis. Aortic Valve: The aortic valve is normal in structure. Aortic valve regurgitation is not visualized. Mild aortic valve sclerosis is present, with no evidence of aortic valve stenosis. Pulmonic Valve: The pulmonic valve was normal in structure. Pulmonic valve regurgitation is not visualized. No evidence of pulmonic stenosis. Aorta: The aortic root is normal in size and structure. Venous: The inferior vena cava is normal in size with greater than 50% respiratory variability, suggesting right atrial pressure of 3 mmHg. IAS/Shunts: No atrial level shunt detected by  color flow Doppler.  LEFT VENTRICLE PLAX 2D LVIDd:         5.21 cm LVIDs:         3.68 cm LV PW:         1.26 cm LV IVS:        1.52 cm LVOT diam:     2.10 cm LVOT Area:     3.46 cm  LEFT ATRIUM         Index LA diam:    4.70 cm 2.03 cm/m                        PULMONIC VALVE AORTA                 PV Vmax:        0.66 m/s Ao Root diam: 3.00 cm PV Peak grad:   1.7 mmHg                       RVOT Peak grad: 2 mmHg   SHUNTS Systemic Diam: 2.10 cm Ida Rogue MD Electronically signed by Ida Rogue MD Signature Date/Time: 12/08/2019/3:01:46 PM    Final      Scheduled Meds: . amLODipine  10 mg Oral Daily  . aspirin EC  81 mg Oral Daily  . atorvastatin  80 mg Oral Daily  . Chlorhexidine Gluconate Cloth  6 each Topical Daily  . clopidogrel  75 mg Oral Daily  . enoxaparin (LOVENOX) injection  40 mg Subcutaneous Q24H  . fenofibrate  160 mg Oral Daily  . furosemide  40 mg Intravenous BID  . insulin aspart  0-15 Units Subcutaneous TID WC  . ipratropium-albuterol  3 mL Nebulization Q4H  . methylPREDNISolone (SOLU-MEDROL) injection  40 mg Intravenous Q12H  . metoprolol tartrate  25 mg Oral BID  . pantoprazole  40 mg Oral QAC  breakfast  . sodium chloride flush  3 mL Intravenous Q12H  . spironolactone  25 mg Oral Daily   Continuous Infusions: . sodium chloride       LOS: 2 days     Enzo Bi, MD [facility] If 7PM-7AM, please contact night-coverage 12/09/2019, 4:20 PM

## 2019-12-09 NOTE — Progress Notes (Signed)
Progress Note  Patient Name: Joshua Roy Date of Encounter: 12/09/2019  Primary Cardiologist: Ida Rogue, MD   Subjective   Difficult night, had difficulty sleeping Was given Benadryl, Xanax with no assistance in his sleep He reports anxiety at night has been a chronic issue Shortness of breath, BiPAP was placed but he was unable to tolerate Instead was placed on facemask/high flow Weaned to nasal cannula high flow this morning Still desaturating on room air Some confusion this morning, thinks he can do well at home," I have oxygen at home"  Inpatient Medications    Scheduled Meds: . amLODipine  10 mg Oral Joshua  . aspirin EC  81 mg Oral Joshua  . atorvastatin  80 mg Oral Joshua  . Chlorhexidine Gluconate Cloth  6 each Topical Joshua  . clopidogrel  75 mg Oral Joshua  . enoxaparin (LOVENOX) injection  40 mg Subcutaneous Q24H  . fenofibrate  160 mg Oral Joshua  . furosemide  40 mg Intravenous BID  . ipratropium-albuterol  3 mL Nebulization Q4H  . methylPREDNISolone (SOLU-MEDROL) injection  40 mg Intravenous Q12H  . metoprolol tartrate  25 mg Oral BID  . pantoprazole  40 mg Oral QAC breakfast  . sodium chloride flush  3 mL Intravenous Q12H  . spironolactone  25 mg Oral Joshua   Continuous Infusions: . sodium chloride    . azithromycin     PRN Meds: sodium chloride, acetaminophen, ALPRAZolam, nicotine, ondansetron (ZOFRAN) IV, sodium chloride flush, traZODone   Vital Signs    Vitals:   12/09/19 0744 12/09/19 0800 12/09/19 0900 12/09/19 1000  BP:  117/63 126/63 129/63  Pulse:  73 71 74  Resp:  (!) 30 (!) 22 (!) 22  Temp:  (!) 97.5 F (36.4 C)    TempSrc:  Oral    SpO2: 100% 100% 90% 93%  Weight:      Height:        Intake/Output Summary (Last 24 hours) at 12/09/2019 1050 Last data filed at 12/09/2019 0800 Gross per 24 hour  Intake 1200 ml  Output 1500 ml  Net -300 ml   Last 3 Weights 12/09/2019 12/07/2019 12/07/2019  Weight (lbs) 259 lb 14.8 oz 255  lb 4.7 oz 259 lb 12.8 oz  Weight (kg) 117.9 kg 115.8 kg 117.845 kg      Telemetry    Normal sinus rhythm- Personally Reviewed  ECG     - Personally Reviewed  Physical Exam   GEN:  Mild respiratory distress Neck: Unable to estimate JVD Cardiac: RRR, no murmurs, rubs, or gallops.  Respiratory: Coarse breath sounds bilaterally GI: Soft, nontender, non-distended  MS:  1-2+ pitting edema; No deformity. Neuro:  Nonfocal  Psych: Mild confusion  Labs    High Sensitivity Troponin:   Recent Labs  Lab 12/07/19 1218 12/07/19 1435  TROPONINIHS 11 12      Chemistry Recent Labs  Lab 12/07/19 1218 12/08/19 0552 12/09/19 0336  NA 136 138 137  K 4.6 4.4 4.6  CL 91* 92* 90*  CO2 34* 37* 38*  GLUCOSE 114* 139* 84  BUN 38* 39* 50*  CREATININE 1.92* 1.96* 1.91*  CALCIUM 9.1 8.7* 8.5*  PROT 7.2  --   --   ALBUMIN 3.8  --   --   AST 19  --   --   ALT 22  --   --   ALKPHOS 25*  --   --   BILITOT 1.0  --   --   GFRNONAA 35*  34* 35*  GFRAA 40* 39* 40*  ANIONGAP 11 9 9      Hematology Recent Labs  Lab 12/07/19 1218 12/08/19 0552 12/09/19 0336  WBC 9.3 7.5 8.3  RBC 4.86 4.63 4.39  HGB 10.6* 10.0* 9.4*  HCT 37.1* 34.5* 32.8*  MCV 76.3* 74.5* 74.7*  MCH 21.8* 21.6* 21.4*  MCHC 28.6* 29.0* 28.7*  RDW 19.8* 19.9* 19.7*  PLT 322 284 287    BNP Recent Labs  Lab 12/07/19 1218 12/08/19 0552  BNP 475.0* 411.0*     DDimer No results for input(s): DDIMER in the last 168 hours.   Radiology    DG Chest 1 View  Result Date: 12/07/2019 CLINICAL DATA:  Increasing shortness of breath EXAM: CHEST  1 VIEW COMPARISON:  Film from earlier in the same day. FINDINGS: Cardiac shadow is stable. Aortic calcifications are again noted and stable. Lungs are well aerated bilaterally. Mild vascular congestion is again seen and stable. No new focal infiltrate is seen. No bony abnormality is noted. IMPRESSION: Stable vascular congestion.  No new focal abnormality is noted. Electronically  Signed   By: Inez Catalina M.D.   On: 12/07/2019 21:21   DG Chest Port 1 View  Result Date: 12/07/2019 CLINICAL DATA:  Hypoxemia EXAM: PORTABLE CHEST 1 VIEW COMPARISON:  11/02/2019 FINDINGS: Mild cardiac enlargement. Mild vascular congestion without edema. Small left effusion. Negative for pneumonia. Mild atelectasis in the bases. IMPRESSION: Mild vascular congestion and small left effusion. Negative for edema. Electronically Signed   By: Franchot Gallo M.D.   On: 12/07/2019 12:58   ECHOCARDIOGRAM COMPLETE  Result Date: 12/08/2019    ECHOCARDIOGRAM REPORT   Patient Name:   Joshua Roy [facility] Date of Exam: 12/08/2019 Medical Rec #:  [id number]            Height:       70.0 in Accession #:    [id number]           Weight:       255.3 lb Date of Birth:  12/17/1950           BSA:          2.315 m Patient Age:    69 years             BP:           119/61 mmHg Patient Gender: M                    HR:           81 bpm. Exam Location:  [facility] Procedure: 2D Echo, Cardiac Doppler and Color Doppler Indications:     CHF- acute diastolic 633.35  History:         Patient has prior history of Echocardiogram examinations, most                  recent 10/07/2019. Prior CABG, COPD; Risk Factors:Hypertension.                  MI, PAF.  Sonographer:     Sherrie Sport RDCS (AE) Referring Phys:  [id number] Bradly Bienenstock Diagnosing Phys: Ida Rogue MD  Sonographer Comments: No apical window and no subcostal window. Image acquisition challenging due to COPD. IMPRESSIONS  1. Left ventricular ejection fraction, by estimation, is 55 to 60%. The left ventricle has normal function. The left ventricle has no regional wall motion abnormalities. There is mild left ventricular hypertrophy. Left ventricular diastolic parameters are indeterminate.  2.  Right ventricular systolic function is normal. The right ventricular size is normal. Tricuspid regurgitation signal is inadequate for assessing PA pressure.  3. Left atrial size was moderately  dilated.  4. Challenging image quality FINDINGS  Left Ventricle: Left ventricular ejection fraction, by estimation, is 55 to 60%. The left ventricle has normal function. The left ventricle has no regional wall motion abnormalities. The left ventricular internal cavity size was normal in size. There is  mild left ventricular hypertrophy. Left ventricular diastolic parameters are indeterminate. Right Ventricle: The right ventricular size is normal. No increase in right ventricular wall thickness. Right ventricular systolic function is normal. Tricuspid regurgitation signal is inadequate for assessing PA pressure. Left Atrium: Left atrial size was moderately dilated. Right Atrium: Right atrial size was normal in size. Pericardium: A small pericardial effusion is present. Mitral Valve: The mitral valve is normal in structure. Normal mobility of the mitral valve leaflets. No evidence of mitral valve regurgitation. No evidence of mitral valve stenosis. Tricuspid Valve: The tricuspid valve is normal in structure. Tricuspid valve regurgitation is mild . No evidence of tricuspid stenosis. Aortic Valve: The aortic valve is normal in structure. Aortic valve regurgitation is not visualized. Mild aortic valve sclerosis is present, with no evidence of aortic valve stenosis. Pulmonic Valve: The pulmonic valve was normal in structure. Pulmonic valve regurgitation is not visualized. No evidence of pulmonic stenosis. Aorta: The aortic root is normal in size and structure. Venous: The inferior vena cava is normal in size with greater than 50% respiratory variability, suggesting right atrial pressure of 3 mmHg. IAS/Shunts: No atrial level shunt detected by color flow Doppler.  LEFT VENTRICLE PLAX 2D LVIDd:         5.21 cm LVIDs:         3.68 cm LV PW:         1.26 cm LV IVS:        1.52 cm LVOT diam:     2.10 cm LVOT Area:     3.46 cm  LEFT ATRIUM         Index LA diam:    4.70 cm 2.03 cm/m                        PULMONIC VALVE AORTA                  PV Vmax:        0.66 m/s Ao Root diam: 3.00 cm PV Peak grad:   1.7 mmHg                       RVOT Peak grad: 2 mmHg   SHUNTS Systemic Diam: 2.10 cm Ida Rogue MD Electronically signed by Ida Rogue MD Signature Date/Time: 12/08/2019/3:01:46 PM    Final     Cardiac Studies     Patient Profile     69 year old gentleman with coronary disease, recent bypass surgery March 2021, known carotid arterial disease prior carotid endarterectomy March 2021, postoperative atrial fibrillation, diabetes type 2, smoker, COPD, morbid obesity, chronic kidney disease presenting with worsening shortness of breath  Assessment & Plan    Acute on chronic respiratory failure Marked hypoxia in the office 12/07/2019, sent to the emergency room Hypoxia has persisted this admission Yesterday 10 L saturations 88 to 90% Etiology likely COPD in the setting of heart failure, continues to smoke, acute on chronic renal failure, anemia --Worsening cough and congestion this morning, --Would continue with Lasix  IV twice Joshua with close monitoring of renal function, -Would involve nephrology given worsening renal function in the past month now with underlying anemia --Steroids for COPD exacerbation --Low threshold to add antibiotics for any productive sputum  Acute on chronic diastolic CHF Continue poor diet, high water intake at home for the patient Went to [place] between leaving our office and going to the emergency room -Very little insight Management more challenging in the setting of anemia, COPD, worsening renal failure/diabetic nephropathy  Acute on chronic renal failure Worse over the past month,  Unclear if this is cardiorenal syndrome versus diabetic nephropathy/ATN -Consider renal ultrasound, -Proceed with Lasix cautiously as we are doing with close monitoring of renal function  COPD exacerbation, active smoker Smoking cessation recommended, Has little insight Has nicotine  patch in place -High flow oxygen, now on steroids  CAD with stable angina, s/p CABG Denies any chest pain, troponin negative No further ischemic work-up at this time Smoking cessation recommended, continue aspirin, Plavix, statin Lipids reviewed Paroxysmal atrial fibrillation Maintaining normal sinus rhythm Continue metoprolol twice Joshua  Essential hypertension On spironolactone, metoprolol, amlodipine, Lasix Blood pressure stable  Obstructive sleep apnea Likely contributing to his heart failure symptoms Scheduled for part of his sleep study last night, will have to reschedule  CRITICAL CARE Performed by: Esmond Plants, MD  Total critical care time: 45 minutes  Critical care time was exclusive of separately billable procedures and treating other patients.  Critical care was necessary to treat or prevent imminent or life-threatening deterioration.  Critical care was time spent personally by me (independent of midlevel providers or residents) on the following activities: development of treatment plan with patient and/or surrogate as well as nursing, discussions with consultants, evaluation of patient's response to treatment, examination of patient, obtaining history from patient or surrogate, ordering and performing treatments and interventions, ordering and review of laboratory studies, ordering and review of radiographic studies, pulse oximetry and re-evaluation of patient's condition.   For questions or updates, please contact [place] Please consult [email or web address] for contact info under        Signed, Ida Rogue, MD  12/09/2019, 10:50 AM

## 2019-12-09 NOTE — Progress Notes (Signed)
Notified by Jinny Blossom, NT that patients 0800 CBG was 47 After 400 cc of orange juice and breakfast patients CBG 94 Attending MD made aware Insulin order has been discontinued by Dr. Billie Ruddy

## 2019-12-09 NOTE — Progress Notes (Signed)
Patient placed on HFNC 9L by this RN at the start of shift approximately 0800 from NRB overnight. He has maintained good oxygen saturation. Patient has been drowsy today possibly due to getting Benadryl last night to help him sleep. Patient has been complaining of being unable to get good rest here and would like to go home as soon as possible. He is aware of his current condition and knows that going home at this time would be detrimental for his health. Dr. Mortimer Fries made aware that the patient was noncompliant on bipap over the night and palliative consult was placed.

## 2019-12-09 NOTE — Progress Notes (Signed)
VSS. Oxygen probe is positional, but pt sats 92-93% when in good placement. On HFNC. Pt does not appear to be in distress. A&OX4 and symmetrical in all extremities. Will continue to monitor and wean. MD made aware of high CBG this evening and SSI added. Will continue to monitor.

## 2019-12-09 NOTE — Progress Notes (Signed)
Inpatient Diabetes Program Recommendations  AACE/ADA: New Consensus Statement on Inpatient Glycemic Control (2015)  Target Ranges:  Prepandial:   less than 140 mg/dL      Peak postprandial:   less than 180 mg/dL (1-2 hours)      Critically ill patients:  140 - 180 mg/dL   Lab Results  Component Value Date   GLUCAP 94 12/09/2019   HGBA1C 7.4 (H) 12/07/2019    Review of Glycemic Control Results for Joshua Roy, Joshua Roy (MRN [id number]) as of 12/09/2019 09:42  Ref. Range 12/08/2019 20:07 12/08/2019 22:04 12/09/2019 07:47 12/09/2019 08:25 12/09/2019 09:40  Glucose-Capillary Latest Ref Range: 70 - 99 mg/dL 232 (H) 190 (H) 47 (L) 47 (L) 94   Diabetes history: DM 2 Outpatient Diabetes medications: Amaryl 2 mg Daily, Metformin 1000 mg bid Current orders for Inpatient glycemic control:  none  Inpatient Diabetes Program Recommendations:    Hypoglycemia 47 this am. Pt was on Novolog Resistant correction scale (0-20 units) but also had elevated renal function.  Ordered Solumedrol 40 mg Q12 hours.   Consider adding back Novolog sensitive 0-9 units tid + hs scale at the most we also have "very sensitive scale" starting at 150 mg/dl.  Thanks,  Tama Headings RN, MSN, BC-ADM Inpatient Diabetes Coordinator Team Pager [phone] (8a-5p)

## 2019-12-10 DIAGNOSIS — J441 Chronic obstructive pulmonary disease with (acute) exacerbation: Secondary | ICD-10-CM

## 2019-12-10 LAB — BASIC METABOLIC PANEL WITH GFR
Anion gap: 9 (ref 5–15)
BUN: 46 mg/dL — ABNORMAL HIGH (ref 8–23)
CO2: 40 mmol/L — ABNORMAL HIGH (ref 22–32)
Calcium: 8.7 mg/dL — ABNORMAL LOW (ref 8.9–10.3)
Chloride: 87 mmol/L — ABNORMAL LOW (ref 98–111)
Creatinine, Ser: 1.92 mg/dL — ABNORMAL HIGH (ref 0.61–1.24)
GFR calc Af Amer: 40 mL/min — ABNORMAL LOW (ref >60)
GFR calc non Af Amer: 35 mL/min — ABNORMAL LOW (ref >60)
Glucose, Bld: 215 mg/dL — ABNORMAL HIGH (ref 70–99)
Potassium: 4.9 mmol/L (ref 3.5–5.1)
Sodium: 136 mmol/L (ref 135–145)

## 2019-12-10 LAB — CBC
HCT: 34.1 % — ABNORMAL LOW (ref 39.0–52.0)
Hemoglobin: 9.8 g/dL — ABNORMAL LOW (ref 13.0–17.0)
MCH: 21.5 pg — ABNORMAL LOW (ref 26.0–34.0)
MCHC: 28.7 g/dL — ABNORMAL LOW (ref 30.0–36.0)
MCV: 74.8 fL — ABNORMAL LOW (ref 80.0–100.0)
Platelets: 292 K/uL (ref 150–400)
RBC: 4.56 MIL/uL (ref 4.22–5.81)
RDW: 19.5 % — ABNORMAL HIGH (ref 11.5–15.5)
WBC: 6.2 K/uL (ref 4.0–10.5)
nRBC: 0 % (ref 0.0–0.2)

## 2019-12-10 LAB — MAGNESIUM: Magnesium: 2.2 mg/dL (ref 1.7–2.4)

## 2019-12-10 LAB — GLUCOSE, CAPILLARY
Glucose-Capillary: 222 mg/dL — ABNORMAL HIGH (ref 70–99)
Glucose-Capillary: 338 mg/dL — ABNORMAL HIGH (ref 70–99)
Glucose-Capillary: 270 mg/dL — ABNORMAL HIGH (ref 70–99)
Glucose-Capillary: 289 mg/dL — ABNORMAL HIGH (ref 70–99)

## 2019-12-10 MED ORDER — ACETAZOLAMIDE 250 MG PO TABS
500.0000 mg | ORAL_TABLET | Freq: Two times a day (BID) | ORAL | Status: DC
  Administered 2019-12-10 – 2019-12-13 (×7): 500 mg via ORAL
  Filled 2019-12-10 (×8): qty 2

## 2019-12-10 MED ORDER — CHLORHEXIDINE GLUCONATE CLOTH 2 % EX PADS
6.0000 | MEDICATED_PAD | Freq: Every day | CUTANEOUS | Status: DC
  Administered 2019-12-10 – 2019-12-13 (×4): 6 via TOPICAL

## 2019-12-10 MED ORDER — BUDESONIDE 0.5 MG/2ML IN SUSP
0.5000 mg | Freq: Two times a day (BID) | RESPIRATORY_TRACT | Status: DC
  Administered 2019-12-10 – 2019-12-14 (×9): 0.5 mg via RESPIRATORY_TRACT
  Filled 2019-12-10 (×8): qty 2

## 2019-12-10 MED ORDER — INSULIN ASPART 100 UNIT/ML ~~LOC~~ SOLN
5.0000 [IU] | Freq: Three times a day (TID) | SUBCUTANEOUS | Status: DC
  Administered 2019-12-10 (×2): 5 [IU] via SUBCUTANEOUS
  Filled 2019-12-10 (×2): qty 1

## 2019-12-10 MED ORDER — INSULIN ASPART 100 UNIT/ML ~~LOC~~ SOLN
0.0000 [IU] | Freq: Every day | SUBCUTANEOUS | Status: DC
  Filled 2019-12-10: qty 1

## 2019-12-10 MED ORDER — SODIUM CHLORIDE 0.9 % IV SOLN
2.0000 g | Freq: Every day | INTRAVENOUS | Status: DC
  Administered 2019-12-10 – 2019-12-11 (×2): 2 g via INTRAVENOUS
  Filled 2019-12-10 (×2): qty 2
  Filled 2019-12-10: qty 20

## 2019-12-10 MED ORDER — AZITHROMYCIN 500 MG PO TABS
500.0000 mg | ORAL_TABLET | Freq: Every day | ORAL | Status: DC
  Administered 2019-12-10 – 2019-12-11 (×2): 500 mg via ORAL
  Filled 2019-12-10 (×3): qty 1

## 2019-12-10 MED ORDER — INSULIN ASPART 100 UNIT/ML ~~LOC~~ SOLN
0.0000 [IU] | Freq: Three times a day (TID) | SUBCUTANEOUS | Status: DC
  Administered 2019-12-10: 3 [IU] via SUBCUTANEOUS
  Administered 2019-12-10: 8 [IU] via SUBCUTANEOUS
  Administered 2019-12-11: 5 [IU] via SUBCUTANEOUS
  Administered 2019-12-11 (×2): 8 [IU] via SUBCUTANEOUS
  Administered 2019-12-12 (×3): 3 [IU] via SUBCUTANEOUS
  Administered 2019-12-13: 8 [IU] via SUBCUTANEOUS
  Filled 2019-12-10 (×9): qty 1

## 2019-12-10 MED ORDER — ALPRAZOLAM 0.25 MG PO TABS
0.2500 mg | ORAL_TABLET | Freq: Two times a day (BID) | ORAL | Status: DC | PRN
  Administered 2019-12-10 – 2019-12-13 (×4): 0.25 mg via ORAL
  Filled 2019-12-10 (×4): qty 1

## 2019-12-10 NOTE — Progress Notes (Signed)
Progress Note  Patient Name: Joshua Roy Date of Encounter: 12/10/2019  Primary Cardiologist: Ida Rogue, MD   Subjective   Anxious to go home today Again does not appreciate he is on 11 L nasal cannula oxygen saturations down to the mid 80s Significant cough, chest congestion No family at the bedside again.  Family has not been seen by our rounding team through his hospital course  Reports that he can go home as he has oxygen at home Frustrated he does not have his own bathroom. Board laying in the bed. "  I can do this at home" Reports that " all of this could have been fixed if he did something when I called your office last week".  Reported to him again there was no documentation of him calling our office.  It appears he was in touch with primary care and they changed his diuretic regiment  Inpatient Medications    Scheduled Meds: . acetaZOLAMIDE  500 mg Oral BID  . amLODipine  10 mg Oral Daily  . aspirin EC  81 mg Oral Daily  . atorvastatin  80 mg Oral Daily  . azithromycin  500 mg Oral Daily  . budesonide (PULMICORT) nebulizer solution  0.5 mg Nebulization BID  . Chlorhexidine Gluconate Cloth  6 each Topical Daily  . clopidogrel  75 mg Oral Daily  . enoxaparin (LOVENOX) injection  40 mg Subcutaneous Q24H  . fenofibrate  160 mg Oral Daily  . insulin aspart  0-15 Units Subcutaneous TID WC  . insulin aspart  5 Units Subcutaneous TID WC  . ipratropium-albuterol  3 mL Nebulization Q4H  . methylPREDNISolone (SOLU-MEDROL) injection  40 mg Intravenous Q12H  . metoprolol tartrate  25 mg Oral BID  . pantoprazole  40 mg Oral QAC breakfast  . sodium chloride flush  3 mL Intravenous Q12H  . spironolactone  25 mg Oral Daily   Continuous Infusions: . sodium chloride    . cefTRIAXone (ROCEPHIN)  IV 2 g (12/10/19 1236)   PRN Meds: sodium chloride, acetaminophen, ALPRAZolam, nicotine, ondansetron (ZOFRAN) IV, sodium chloride flush, traZODone   Vital Signs      Vitals:   12/10/19 0600 12/10/19 0800 12/10/19 0900 12/10/19 1107  BP: 121/61 120/61 133/66   Pulse: 72     Resp: (!) 25 (!) 27 (!) 26   Temp:  98.6 F (37 C)    TempSrc:  Oral    SpO2: 97%   95%  Weight:      Height:        Intake/Output Summary (Last 24 hours) at 12/10/2019 1310 Last data filed at 12/10/2019 0800 Gross per 24 hour  Intake 250 ml  Output 2200 ml  Net -1950 ml   Last 3 Weights 12/10/2019 12/09/2019 12/07/2019  Weight (lbs) 253 lb 8.5 oz 259 lb 14.8 oz 255 lb 4.7 oz  Weight (kg) 115 kg 117.9 kg 115.8 kg      Telemetry    Normal sinus rhythm- Personally Reviewed  ECG     - Personally Reviewed  Physical Exam   Constitutional:  oriented to person, place, and time. No distress.  HENT:  Head: Grossly normal Eyes:  no discharge. No scleral icterus.  Neck:  JVD 10 , no carotid bruits  Cardiovascular: Regular rate and rhythm, no murmurs appreciated Pulmonary/Chest: Coarse breath sounds bilaterally Abdominal: Soft.  no distension.  no tenderness.  Musculoskeletal: Normal range of motion Neurological:  normal muscle tone. Coordination normal. No atrophy Skin: Skin warm and  dry Psychiatric: Alert   Labs    High Sensitivity Troponin:   Recent Labs  Lab 12/07/19 1218 12/07/19 1435  TROPONINIHS 11 12      Chemistry Recent Labs  Lab 12/07/19 1218 12/07/19 1218 12/08/19 0552 12/09/19 0336 12/10/19 0433  NA 136   < > 138 137 136  K 4.6   < > 4.4 4.6 4.9  CL 91*   < > 92* 90* 87*  CO2 34*   < > 37* 38* 40*  GLUCOSE 114*   < > 139* 84 215*  BUN 38*   < > 39* 50* 46*  CREATININE 1.92*   < > 1.96* 1.91* 1.92*  CALCIUM 9.1   < > 8.7* 8.5* 8.7*  PROT 7.2  --   --   --   --   ALBUMIN 3.8  --   --   --   --   AST 19  --   --   --   --   ALT 22  --   --   --   --   ALKPHOS 25*  --   --   --   --   BILITOT 1.0  --   --   --   --   GFRNONAA 35*   < > 34* 35* 35*  GFRAA 40*   < > 39* 40* 40*  ANIONGAP 11   < > 9 9 9    < > = values in this interval  not displayed.     Hematology Recent Labs  Lab 12/08/19 0552 12/09/19 0336 12/10/19 0433  WBC 7.5 8.3 6.2  RBC 4.63 4.39 4.56  HGB 10.0* 9.4* 9.8*  HCT 34.5* 32.8* 34.1*  MCV 74.5* 74.7* 74.8*  MCH 21.6* 21.4* 21.5*  MCHC 29.0* 28.7* 28.7*  RDW 19.9* 19.7* 19.5*  PLT 284 287 292    BNP Recent Labs  Lab 12/07/19 1218 12/08/19 0552  BNP 475.0* 411.0*     DDimer No results for input(s): DDIMER in the last 168 hours.   Radiology    No results found.  Cardiac Studies     Patient Profile     69 year old gentleman with coronary disease, recent bypass surgery March 2021, known carotid arterial disease prior carotid endarterectomy March 2021, postoperative atrial fibrillation, diabetes type 2, smoker, COPD, morbid obesity, chronic kidney disease presenting with worsening shortness of breath  Assessment & Plan    Acute on chronic respiratory failure Multifactorial including underlying COPD/COPD exacerbation, diastolic heart failure, anemia, continues to smoke even following bypass surgery Marked hypoxia in the office 12/07/2019, sent to the emergency room Hypoxia has persisted  Despite diuresis still on 11 L saturations 88 to 90% -Case discussed with critical care, bicarb getting worse, will change Lasix to Diamox twice daily -On steroids for COPD exacerbation -Long discussion with him concerning his unstable nature and he is not stable to go home.  This has been reiterated to him daily and by multiple teams but he still thinks he can go home on 2 L (currently on 11 L/high flow and desaturating)  Acute on chronic diastolic CHF  poor diet, high water intake at home, in the setting of COPD continued smoking Note indicating he went to [place] between leaving our office and going to the emergency room -Very little insight.  Even again today Little insight into his situation, long discussion with him concerning multiple organ systems involved --- Worsening renal failure  stage III making diuresis more challenging Likely diabetic nephropathy  Acute on chronic renal failure Worse over the past month,  Unclear if this is cardiorenal syndrome versus diabetic nephropathy/ATN Lasix changed to Diamox  COPD exacerbation, active smoker Smoking cessation recommended, Has poor insight Remains on high flow oxygen 11 L, now on steroids, also covered with broad-spectrum antibiotics  CAD with stable angina, s/p CABG Denies any chest pain, troponin negative No further ischemic work-up at this time Smoking cessation recommended, continue aspirin, Plavix, statin  Paroxysmal atrial fibrillation Maintaining normal sinus rhythm On metoprolol twice daily  Essential hypertension On spironolactone, metoprolol, amlodipine, Lasix Blood pressure stable  Obstructive sleep apnea Need outpatient scheduling  Dispo: Patient continues to insist on going home that he has oxygen at home that can support him Time spent discussing his multiple organ systems involved including worsening renal failure, anemia, diastolic heart failure, severe lung disease/COPD exacerbation, with continued hypoxia limiting his discharge home If he did leave the hospital today would be very high risk of readmission within 24 hours given profound hypoxia as seen in his recent clinic visit in our office   CRITICAL CARE Performed by: Esmond Plants, MD  Total critical care time: 35 minutes  Critical care time was exclusive of separately billable procedures and treating other patients.  Critical care was necessary to treat or prevent imminent or life-threatening deterioration.  Critical care was time spent personally by me (independent of midlevel providers or residents) on the following activities: development of treatment plan with patient and/or surrogate as well as nursing, discussions with consultants, evaluation of patient's response to treatment, examination of patient, obtaining history  from patient or surrogate, ordering and performing treatments and interventions, ordering and review of laboratory studies, ordering and review of radiographic studies, pulse oximetry and re-evaluation of patient's condition.   For questions or updates, please contact [place] Please consult [email or web address] for contact info under        Signed, Ida Rogue, MD  12/10/2019, 1:10 PM

## 2019-12-10 NOTE — Progress Notes (Signed)
Auto cpap not initiated at this time due to high FIO2 requirements. Pt states that he feels comfortable with the Bipap.

## 2019-12-10 NOTE — Progress Notes (Addendum)
PROGRESS NOTE    [name]  ZOX:[id number] DOB: 1950-08-11 DOA: 12/07/2019 PCP: [name], MD    Assessment & Plan:   Principal Problem:   Acute on chronic diastolic CHF (congestive heart failure) ([place]) Active Problems:   DISORDER, TOBACCO USE   Essential hypertension   COPD (chronic obstructive pulmonary disease) ([place])   CAD (coronary artery disease)   Morbid obesity ([place])   Acute on chronic respiratory failure with hypercapnia ([place])    Joshua Roy is a 69 y.o. Caucasian male with medical history significant for coronary artery disease status post recent four-vessel CABG, carotid disease status post recent carotid endarterectomy, history of A. 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 Patient complains of easy fatigability as well as trouble sleeping.  He also complains of dyspnea with exertion, bilateral lower extremity swelling, increased abdominal girth, orthopnea and a 14 pound weight gain since his last hospitalization.  He reports compliance with his medications but does not think he has been compliant with his diet.  He continues to smoke cigarettes.  Patient has had an increase in the dose of his diuretic from 40 mg daily to 40 mg in the morning and 20 in the afternoon by his primary care provider because of his significant weight gain. While in the office he was noted to have a pulse oximetry of 68% on room air, patient is not on home oxygen.  He was sent to the emergency room for further evaluation.   Acute on chronic diastolic dysfunction CHF Last known LVEF of 60 - 65% Patient presents for evaluation of exertional SOB, lower extremity swelling and 14lb weight gain --Cardiology consulted PLAN: --continue Lasix 40 BID with close monitoring of renal function --continue  Aldactone  Acute on chronic respiratory failure with hypercapnia Likely OSA Patient was hypoxic in the field with pulse oximetry in the low 70s and also had increased work of breathing Acute worsening secondary to acute CHF exacerbation He required noninvasive mechanical ventilation to reduce his work of breathing and improve oxygenation Arterial blood gas showed uncompensated respiratory acidosis with hypercarbia --procal neg. --Continue BiPAP nightly   COPD exacerbation Since pt is not improving with diuretic, will treat for exacerbation. 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 --SSI TID (no bed-time coverage, unless eating a meal at bedtime)   Paroxysmal atrial fibrillation Maintaining normal sinus rhythm Continue metoprolol twice daily   DVT prophylaxis: Lovenox SQ Code Status: Full code  Family Communication:  Status is: inpatient Dispo:   The patient is from: home Anticipated d/c is to: home Anticipated d/c date is: unclear Patient currently is not medically stable to d/c due to: still on 12L high flow.   Subjective and Interval History:  O2 requirement continued to remain high.  Pt reported sleeping better last night.  No fever, chest pain, abdominal pain, N/V/D, dysuria.  Ate more.  Objective: Vitals:   12/10/19 0800 12/10/19 0900 12/10/19 1107 12/10/19 1200  BP: 120/61 133/66    Pulse:      Resp: (!) 27 (!) 26    Temp:  98.6 F (37 C)   98.5 F (36.9 C)  TempSrc: Oral   Oral  SpO2:   95%   Weight:      Height:        Intake/Output Summary (Last 24 hours) at 12/10/2019 1439 Last data filed at 12/10/2019 1300 Gross per 24 hour  Intake 0 ml  Output 2500 ml  Net -2500 ml   Filed Weights   12/07/19 2230 12/09/19 0500 12/10/19 0344  Weight: 115.8 kg 117.9 kg 115 kg    Examination:   Constitutional: NAD, AAOx3 HEENT: conjunctivae and lids normal, EOMI CV: RRR no M,R,G. Distal pulses +2.  No cyanosis.   RESP: Coarse rhonchi, on 12L, sating 89% GI: +BS, NTND Extremities: 1+ pitting edema up to knees in BLE, improved from prior SKIN: warm, dry and intact Neuro: II - XII grossly intact.  Sensation intact   Data Reviewed: I have personally reviewed following labs and imaging studies  CBC: Recent Labs  Lab 12/07/19 1218 12/08/19 0552 12/09/19 0336 12/10/19 0433  WBC 9.3 7.5 8.3 6.2  NEUTROABS 7.0  --   --   --   HGB 10.6* 10.0* 9.4* 9.8*  HCT 37.1* 34.5* 32.8* 34.1*  MCV 76.3* 74.5* 74.7* 74.8*  PLT 322 284 287 308   Basic Metabolic Panel: Recent Labs  Lab 12/07/19 1218 12/08/19 0552 12/09/19 0336 12/10/19 0433  NA 136 138 137 136  K 4.6 4.4 4.6 4.9  CL 91* 92* 90* 87*  CO2 34* 37* 38* 40*  GLUCOSE 114* 139* 84 215*  BUN 38* 39* 50* 46*  CREATININE 1.92* 1.96* 1.91* 1.92*  CALCIUM 9.1 8.7* 8.5* 8.7*  MG  --   --  2.2 2.2   GFR: Estimated Creatinine Clearance: 45.5 mL/min (A) (by C-G formula based on SCr of 1.92 mg/dL (H)). Liver Function Tests: Recent Labs  Lab 12/07/19 1218  AST 19  ALT 22  ALKPHOS 25*  BILITOT 1.0  PROT 7.2  ALBUMIN 3.8   No results for input(s): LIPASE, AMYLASE in the last 168 hours. No results for input(s): AMMONIA in the last 168 hours. Coagulation Profile: Recent Labs  Lab 12/07/19 1218  INR 1.2   Cardiac Enzymes: No results for input(s): CKTOTAL, CKMB, CKMBINDEX, TROPONINI in the last 168 hours. BNP (last 3 results) No results for  input(s): PROBNP in the last 8760 hours. HbA1C: Recent Labs    12/07/19 1811  HGBA1C 7.4*   CBG: Recent Labs  Lab 12/09/19 1548 12/09/19 2118 12/09/19 2348 12/10/19 0735 12/10/19 1207  GLUCAP 227* 267* 227* 222* 338*   Lipid Profile: No results for input(s): CHOL, HDL, LDLCALC, TRIG, CHOLHDL, LDLDIRECT in the last 72 hours. Thyroid Function Tests: No results for input(s): TSH, T4TOTAL, FREET4, T3FREE, THYROIDAB in the last 72 hours. Anemia Panel: No results for input(s): VITAMINB12, FOLATE, FERRITIN, TIBC, IRON, RETICCTPCT in the last 72 hours. Sepsis Labs: Recent Labs  Lab 12/09/19 0336  PROCALCITON <0.10    Recent Results (from the past 240 hour(s))  SARS Coronavirus 2 by RT PCR (hospital order, performed in [facility] hospital lab) Nasopharyngeal Nasopharyngeal Swab     Status: None   Collection Time: 12/07/19 12:18 PM   Specimen: Nasopharyngeal Swab  Result Value Ref Range Status   SARS Coronavirus 2 NEGATIVE NEGATIVE Final  Comment: (NOTE) SARS-CoV-2 target nucleic acids are NOT DETECTED. The SARS-CoV-2 RNA is generally detectable in upper and lower respiratory specimens during the acute phase of infection. The lowest concentration of SARS-CoV-2 viral copies this assay can detect is 250 copies / mL. A negative result does not preclude SARS-CoV-2 infection and should not be used as the sole basis for treatment or other patient management decisions.  A negative result may occur with improper specimen collection / handling, submission of specimen other than nasopharyngeal swab, presence of viral mutation(s) within the areas targeted by this assay, and inadequate number of viral copies (<250 copies / mL). A negative result must be combined with clinical observations, patient history, and epidemiological information. Fact Sheet for Patients:   StrictlyIdeas.no Fact Sheet for Healthcare  Providers: BankingDealers.co.za This test is not yet approved or cleared  by the [place] FDA and has been authorized for detection and/or diagnosis of SARS-CoV-2 by FDA under an Emergency Use Authorization (EUA).  This EUA will remain in effect (meaning this test can be used) for the duration of the COVID-19 declaration under Section 564(b)(1) of the Act, 21 U.S.C. section 360bbb-3(b)(1), unless the authorization is terminated or revoked sooner. Performed at [facility], [address] [place], [place] [address]       Radiology Studies: No results found.   Scheduled Meds: . acetaZOLAMIDE  500 mg Oral BID  . amLODipine  10 mg Oral Daily  . aspirin EC  81 mg Oral Daily  . atorvastatin  80 mg Oral Daily  . azithromycin  500 mg Oral Daily  . budesonide (PULMICORT) nebulizer solution  0.5 mg Nebulization BID  . Chlorhexidine Gluconate Cloth  6 each Topical Daily  . clopidogrel  75 mg Oral Daily  . enoxaparin (LOVENOX) injection  40 mg Subcutaneous Q24H  . fenofibrate  160 mg Oral Daily  . insulin aspart  0-15 Units Subcutaneous TID WC  . insulin aspart  5 Units Subcutaneous TID WC  . ipratropium-albuterol  3 mL Nebulization Q4H  . methylPREDNISolone (SOLU-MEDROL) injection  40 mg Intravenous Q12H  . metoprolol tartrate  25 mg Oral BID  . pantoprazole  40 mg Oral QAC breakfast  . sodium chloride flush  3 mL Intravenous Q12H  . spironolactone  25 mg Oral Daily   Continuous Infusions: . sodium chloride    . cefTRIAXone (ROCEPHIN)  IV 2 g (12/10/19 1236)     LOS: 3 days     Enzo Bi, MD [facility] If 7PM-7AM, please contact night-coverage 12/10/2019, 2:39 PM

## 2019-12-10 NOTE — Progress Notes (Signed)
CRITICAL CARE NOTE 5/13 admitted for Acute Hypoxic Hypercapnic Respiratory Failure secondary to Acute Decompensated HFpEF&presumed Obstructive Sleep Apnea   5/15 11 L [place] (previously scheduled for sleep study 12/08/19)  CC  follow up respiratory failure  SUBJECTIVE Prognosis is guarded +SOB pleasant Plan for diamox therapy Case discussed with Dr Rockey Situ +smoker +11 L [place]   BP 133/66   Pulse 72   Temp 98.6 F (37 C) (Oral)   Resp (!) 26   Ht 5\' 10"  (1.778 m)   Wt 115 kg   SpO2 97%   BMI 36.38 kg/m    I/O last 3 completed shifts: In: 850 [P.O.:600; IV Piggyback:250] Out: 3400 [Urine:3400] No intake/output data recorded.  SpO2: 97 % O2 Flow Rate (L/min): 12 L/min FiO2 (%): 98 %  Estimated body mass index is 36.38 kg/m as calculated from the following:   Height as of this encounter: 5\' 10"  (1.778 m).   Weight as of this encounter: 115 kg.   Review of Systems:  Gen:  Denies  fever, sweats, chills weight loss  HEENT: Denies blurred vision, double vision, ear pain, eye pain, hearing loss, nose bleeds, sore throat Cardiac:  No dizziness, chest pain or heaviness, chest tightness,edema, No JVD Resp:   +cough, +sputum production, +shortness of breath,+wheezing, -hemoptysis,  Gi: Denies swallowing difficulty, stomach pain, nausea or vomiting, diarrhea, constipation, bowel incontinence Gu:  Denies bladder incontinence, burning urine Ext:   Denies Joint pain, stiffness or swelling Skin: Denies  skin rash, easy bruising or bleeding or hives Endoc:  Denies polyuria, polydipsia , polyphagia or weight change Psych:   Denies depression, insomnia or hallucinations  Other:  All other systems negative   PHYSICAL EXAMINATION:  GENERAL:critically ill appearing,  HEAD: Normocephalic, atraumatic.  EYES: Pupils equal, round, reactive to light.  No scleral icterus.  MOUTH: Moist mucosal membrane. NECK: Supple.  PULMONARY: +rhonchi, +wheezing CARDIOVASCULAR: S1 and S2. Regular  rate and rhythm. No murmurs, rubs, or gallops.  GASTROINTESTINAL: Soft, nontender, -distended.  Positive bowel sounds.   MUSCULOSKELETAL: No swelling, clubbing, or edema.  NEUROLOGIC: alert and awake SKIN:intact,warm,dry  MEDICATIONS: I have reviewed all medications and confirmed regimen as documented   CULTURE RESULTS   Recent Results (from the past 240 hour(s))  SARS Coronavirus 2 by RT PCR (hospital order, performed in [facility] hospital lab) Nasopharyngeal Nasopharyngeal Swab     Status: None   Collection Time: 12/07/19 12:18 PM   Specimen: Nasopharyngeal Swab  Result Value Ref Range Status   SARS Coronavirus 2 NEGATIVE NEGATIVE Final    Comment: (NOTE) SARS-CoV-2 target nucleic acids are NOT DETECTED. The SARS-CoV-2 RNA is generally detectable in upper and lower respiratory specimens during the acute phase of infection. The lowest concentration of SARS-CoV-2 viral copies this assay can detect is 250 copies / mL. A negative result does not preclude SARS-CoV-2 infection and should not be used as the sole basis for treatment or other patient management decisions.  A negative result may occur with improper specimen collection / handling, submission of specimen other than nasopharyngeal swab, presence of viral mutation(s) within the areas targeted by this assay, and inadequate number of viral copies (<250 copies / mL). A negative result must be combined with clinical observations, patient history, and epidemiological information. Fact Sheet for Patients:   StrictlyIdeas.no Fact Sheet for Healthcare Providers: BankingDealers.co.za This test is not yet approved or cleared  by the [place] FDA and has been authorized for detection and/or diagnosis of SARS-CoV-2 by FDA under an Emergency  Use Authorization (EUA).  This EUA will remain in effect (meaning this test can be used) for the duration of the COVID-19 declaration under  Section 564(b)(1) of the Act, 21 U.S.C. section 360bbb-3(b)(1), unless the authorization is terminated or revoked sooner. Performed at [facility], [address] [place],  [address]                ASSESSMENT AND PLAN SYNOPSIS  -COPD with evidence ofacute exacerbation -Supplemental O2 as needed to maintain O2 saturations 88  -tray AUTOCPAP at night -Follow intermittent ABG and chest x-ray as needed -continue IV steroids as prescribed -IV abx -continue NEB THERAPY as prescribed -morphine as needed -wean fio2 as needed and tolerated  ACUTE DIASSTOLIC CARDIAC FAILURE-  CAD status post CABGin March 2021, Post-op A-fib, HLD -Continuous cardiac monitoring -Maintain MAP greater than 65 diamox therapy addedd -Continue Norvasc,metoprolol. -Cardiology following, appreciate input    Morbid obesity, possible OSA.   Will certainly impact respiratory mechanics Start AUTOCPAP 5-12 cm h20   ACUTE KIDNEY INJURY/Renal Failure -continue Foley Catheter-assess need -Avoid nephrotoxic agents -Follow urine output, BMP -Ensure adequate renal perfusion, optimize oxygenation -Renal dose medications   CARDIAC ICU monitoring  ID -continue IV abx as prescibed -follow up cultures  GI GI PROPHYLAXIS as indicated  NUTRITIONAL STATUS Nutrition Status:    DIET--> as tolerated Constipation protocol as indicated  ENDO - will use ICU hypoglycemic\Hyperglycemia protocol if indicated     ELECTROLYTES -follow labs as needed -replace as needed -pharmacy consultation and following   DVT/GI PRX ordered and assessed TRANSFUSIONS AS NEEDED MONITOR FSBS I Assessed the need for Labs I Assessed the need for Foley I Assessed the need for Central Venous Line I Assessed the need for Mobilization I made an Assessment of medications to be adjusted accordingly Safety Risk assessment completed     Corrin Parker, M.D.  [facility]  Medical Director [place] Director [place] Department

## 2019-12-11 LAB — CBC
HCT: 34.3 % — ABNORMAL LOW (ref 39.0–52.0)
Hemoglobin: 9.8 g/dL — ABNORMAL LOW (ref 13.0–17.0)
MCH: 21.5 pg — ABNORMAL LOW (ref 26.0–34.0)
MCHC: 28.6 g/dL — ABNORMAL LOW (ref 30.0–36.0)
MCV: 75.4 fL — ABNORMAL LOW (ref 80.0–100.0)
Platelets: 278 10*3/uL (ref 150–400)
RBC: 4.55 MIL/uL (ref 4.22–5.81)
RDW: 19.3 % — ABNORMAL HIGH (ref 11.5–15.5)
WBC: 5.6 10*3/uL (ref 4.0–10.5)
nRBC: 0.4 % — ABNORMAL HIGH (ref 0.0–0.2)

## 2019-12-11 LAB — BASIC METABOLIC PANEL
Anion gap: 9 (ref 5–15)
BUN: 42 mg/dL — ABNORMAL HIGH (ref 8–23)
CO2: 36 mmol/L — ABNORMAL HIGH (ref 22–32)
Calcium: 8.8 mg/dL — ABNORMAL LOW (ref 8.9–10.3)
Chloride: 89 mmol/L — ABNORMAL LOW (ref 98–111)
Creatinine, Ser: 1.72 mg/dL — ABNORMAL HIGH (ref 0.61–1.24)
GFR calc Af Amer: 46 mL/min — ABNORMAL LOW (ref >60)
GFR calc non Af Amer: 39 mL/min — ABNORMAL LOW (ref >60)
Glucose, Bld: 272 mg/dL — ABNORMAL HIGH (ref 70–99)
Potassium: 4.9 mmol/L (ref 3.5–5.1)
Sodium: 134 mmol/L — ABNORMAL LOW (ref 135–145)

## 2019-12-11 LAB — MAGNESIUM: Magnesium: 2.3 mg/dL (ref 1.7–2.4)

## 2019-12-11 LAB — GLUCOSE, CAPILLARY
Glucose-Capillary: 222 mg/dL — ABNORMAL HIGH (ref 70–99)
Glucose-Capillary: 281 mg/dL — ABNORMAL HIGH (ref 70–99)
Glucose-Capillary: 272 mg/dL — ABNORMAL HIGH (ref 70–99)
Glucose-Capillary: 326 mg/dL — ABNORMAL HIGH (ref 70–99)
Glucose-Capillary: 357 mg/dL — ABNORMAL HIGH (ref 70–99)

## 2019-12-11 MED ORDER — INSULIN ASPART 100 UNIT/ML ~~LOC~~ SOLN
4.0000 [IU] | Freq: Once | SUBCUTANEOUS | Status: AC
  Administered 2019-12-11: 4 [IU] via SUBCUTANEOUS

## 2019-12-11 MED ORDER — INSULIN ASPART 100 UNIT/ML ~~LOC~~ SOLN
4.0000 [IU] | Freq: Once | SUBCUTANEOUS | Status: AC
  Administered 2019-12-11: 4 [IU] via SUBCUTANEOUS
  Filled 2019-12-11: qty 1

## 2019-12-11 MED ORDER — INSULIN ASPART 100 UNIT/ML ~~LOC~~ SOLN
8.0000 [IU] | Freq: Three times a day (TID) | SUBCUTANEOUS | Status: DC
  Administered 2019-12-11 – 2019-12-12 (×4): 8 [IU] via SUBCUTANEOUS
  Filled 2019-12-11 (×4): qty 1

## 2019-12-11 NOTE — Progress Notes (Signed)
Pt has been very non-compliant with treatment this morning. Pt has requested to leave AMA, but has changed his mind at this time. Pt requested the bi-pap and wore same for approximately 20 minutes, then pulled it off. Pt is also refusing to wear his BP cuff and pulse oximeter probe. Pt is not agitated or angry and he interacts pleasantly with staff, although he is continuing to refuse treatment.

## 2019-12-11 NOTE — Progress Notes (Signed)
Patient has been taking bipap on and off all night. Patient refusing BiPAP provider aware.

## 2019-12-11 NOTE — Progress Notes (Signed)
Pt currently on solumedrol 40 mg iv q12 hours CBG 326 @1940 . Therefore, placed order for pt to receive 4 units of novolog x1 dose.  Marda Stalker, [place] Pager [phone] (please enter 7 digits) PCCM Consult Pager [phone] (please enter 7 digits) a

## 2019-12-11 NOTE — Progress Notes (Signed)
CRITICAL CARE NOTE 5/13 admitted for Acute Hypoxic Hypercapnic Respiratory Failure secondary to Acute Decompensated HFpEF&presumed Obstructive Sleep Apnea   5/15 11 L [place] (previously scheduled for sleep study 12/08/19) 5/16 patient is alert and awake and alert, following commands    CC  follow up respiratory failure  SUBJECTIVE Prognosis is guarded Progressive cardiorenal syndrome  Patient AAOx4 Patient WANTS TO LEAVE AMA   The Risks and Benefits of leaving  were explained to patient/family.  I have discussed the risk for Acute Bleeding, increased chance of Infection, increased chance of Respiratory Failure and Cardiac Arrest, increased chance of pneumothorax and collapsed lung, as well as increased Stroke and Death.  The patient/family understand the risks and benefits and have agreed to signs out AMA.     The Clinical status was relayed to patient and family in detail.  Updated and notified of patients medical condition.  Patient remains unresponsive and will not open eyes to command. patient with slight increased WOB, I Explained to family course of therapy and the modalities     Patient with Progressive cardiorenal syndrome And multiorgan failure with very low chance of meaningful recovery despite all aggressive and optimal medical therapy.   PATIENT AND Family understands the situation.  They have consented and agreed to DNR/DNI    Patient/Family are satisfied with Plan of action and management. All questions answered  PATIENT HAS AGREED TO SIGNING AGAINST MEDICAL ADVICE        BP 131/63 (BP Location: Right Arm)   Pulse 70   Temp 98.2 F (36.8 C) (Oral)   Resp (!) 24   Ht 5\' 10"  (1.778 m)   Wt 114.9 kg   SpO2 95%   BMI 36.35 kg/m    I/O last 3 completed shifts: In: 100 [IV Piggyback:100] Out: 2706 [Urine:4975] No intake/output data recorded.  SpO2: 95 % O2 Flow Rate (L/min): 8 L/min FiO2 (%): 98 %  Estimated body mass index is 36.35  kg/m as calculated from the following:   Height as of this encounter: 5\' 10"  (1.778 m).   Weight as of this encounter: 114.9 kg.   Review of Systems:  Gen:  Denies  fever, sweats, chills weight loss  HEENT: Denies blurred vision, double vision, ear pain, eye pain, hearing loss, nose bleeds, sore throat Cardiac:  No dizziness, chest pain or heaviness, chest tightness,edema, No JVD Resp:   No cough, -sputum production, -shortness of breath,-wheezing, -hemoptysis,  Gi: Denies swallowing difficulty, stomach pain, nausea or vomiting, diarrhea, constipation, bowel incontinence Gu:  Denies bladder incontinence, burning urine Ext:   Denies Joint pain, stiffness or swelling Skin: Denies  skin rash, easy bruising or bleeding or hives Endoc:  Denies polyuria, polydipsia , polyphagia or weight change Psych:   Denies depression, insomnia or hallucinations  Other:  All other systems negative    PHYSICAL EXAMINATION:  GENERAL:critically ill appearing,  HEAD: Normocephalic, atraumatic.  EYES: Pupils equal, round, reactive to light.  No scleral icterus.  MOUTH: Moist mucosal membrane. NECK: Supple.  PULMONARY: +rhonchi, +wheezing CARDIOVASCULAR: S1 and S2. Regular rate and rhythm. No murmurs, rubs, or gallops.  GASTROINTESTINAL: Soft, nontender, -distended.  Positive bowel sounds.   MUSCULOSKELETAL: No swelling, clubbing, or edema.  NEUROLOGIC: AAOx4, no focal deficits SKIN:intact,warm,dry  MEDICATIONS: I have reviewed all medications and confirmed regimen as documented   CULTURE RESULTS   Recent Results (from the past 240 hour(s))  SARS Coronavirus 2 by RT PCR (hospital order, performed in [facility] hospital lab) Nasopharyngeal Nasopharyngeal Swab  Status: None   Collection Time: 12/07/19 12:18 PM   Specimen: Nasopharyngeal Swab  Result Value Ref Range Status   SARS Coronavirus 2 NEGATIVE NEGATIVE Final    Comment: (NOTE) SARS-CoV-2 target nucleic acids are NOT DETECTED. The  SARS-CoV-2 RNA is generally detectable in upper and lower respiratory specimens during the acute phase of infection. The lowest concentration of SARS-CoV-2 viral copies this assay can detect is 250 copies / mL. A negative result does not preclude SARS-CoV-2 infection and should not be used as the sole basis for treatment or other patient management decisions.  A negative result may occur with improper specimen collection / handling, submission of specimen other than nasopharyngeal swab, presence of viral mutation(s) within the areas targeted by this assay, and inadequate number of viral copies (<250 copies / mL). A negative result must be combined with clinical observations, patient history, and epidemiological information. Fact Sheet for Patients:   StrictlyIdeas.no Fact Sheet for Healthcare Providers: BankingDealers.co.za This test is not yet approved or cleared  by the [place] FDA and has been authorized for detection and/or diagnosis of SARS-CoV-2 by FDA under an Emergency Use Authorization (EUA).  This EUA will remain in effect (meaning this test can be used) for the duration of the COVID-19 declaration under Section 564(b)(1) of the Act, 21 U.S.C. section 360bbb-3(b)(1), unless the authorization is terminated or revoked sooner. Performed at [facility], [address], [place], [place] [address]           IMAGING     ASSESSMENT AND PLAN SYNOPSIS  69 yo obese white male with progressive acute decompensated diastolic heart failure with progressive cardiorenal syndrome with high probability of OSA    ACUTE DIASTOLIC CARDIAC FAILURE CAD status post CABGin March 2021, Post-op A-fib, HLD -oxygen as needed -Lasix/diamox  as tolerated -follow up cardiac enzymes as indicated -follow up cardiology recs  PATIENT SIGNING OUT [place] -continue IV steroids as prescribed -continue NEB  THERAPY as prescribed -morphine as needed -wean fio2 as needed and tolerated   Morbid obesity, possible OSA.   Will certainly impact respiratory mechanics,  Patient refused BIPAP last night  ACUTE KIDNEY INJURY/Renal Failure -continue Foley Catheter-assess need -Avoid nephrotoxic agents -Follow urine output, BMP -Ensure adequate renal perfusion, optimize oxygenation -Renal dose medications   CARDIAC ICU monitoring  ID -continue IV abx as prescibed -follow up cultures  GI GI PROPHYLAXIS as indicated  NUTRITIONAL STATUS Nutrition Status:         DIET--> as tolerated Constipation protocol as indicated  ENDO - will use ICU hypoglycemic\Hyperglycemia protocol if indicated     ELECTROLYTES -follow labs as needed -replace as needed -pharmacy consultation and following   DVT/GI PRX ordered and assessed TRANSFUSIONS AS NEEDED MONITOR FSBS I Assessed the need for Labs I Assessed the need for Foley I Assessed the need for Central Venous Line Family Discussion when available I Assessed the need for Mobilization I made an Assessment of medications to be adjusted accordingly Safety Risk assessment completed   CASE DISCUSSED IN MULTIDISCIPLINARY ROUNDS WITH ICU TEAM and DR LAI   PATIENT AT [facility]  Patient with Multiorgan failure and at high risk for cardiac arrest and death.    Corrin Parker, M.D.  [facility]  Medical Director [place] Director [facility] Cardio-Pulmonary Department

## 2019-12-11 NOTE — Progress Notes (Signed)
Progress Note  Patient Name: Joshua Roy Date of Encounter: 12/11/2019  Primary Cardiologist: Ida Rogue, MD   Subjective   Rounds started off with discussion with intensivist and hospitalist service, patient threatening to leave AMA Reports he has continued anxiety, does not like being in the hospital.  On my evaluation, was on 10 L oxygen when I walked in the room When he mouth breathes oxygen saturations down into the 80s, on breathing more through his nose oxygen saturations up to mid 90s -He was on CPAP when I entered, he took this off with help from respiratory therapy so we could talk then requested to put it back on. -Reports he is not sleeping well, this is a chronic issue even at home -Reports medications are not strong enough  Discussed recent progress over the past 24 hours with him including improved renal function, good urine output on diuretic, he is on broad-spectrum antibiotics for bronchitis  Inpatient Medications    Scheduled Meds: . acetaZOLAMIDE  500 mg Oral BID  . amLODipine  10 mg Oral Daily  . aspirin EC  81 mg Oral Daily  . atorvastatin  80 mg Oral Daily  . azithromycin  500 mg Oral Daily  . budesonide (PULMICORT) nebulizer solution  0.5 mg Nebulization BID  . Chlorhexidine Gluconate Cloth  6 each Topical Daily  . clopidogrel  75 mg Oral Daily  . enoxaparin (LOVENOX) injection  40 mg Subcutaneous Q24H  . fenofibrate  160 mg Oral Daily  . insulin aspart  0-15 Units Subcutaneous TID WC  . insulin aspart  8 Units Subcutaneous TID WC  . ipratropium-albuterol  3 mL Nebulization Q4H  . methylPREDNISolone (SOLU-MEDROL) injection  40 mg Intravenous Q12H  . metoprolol tartrate  25 mg Oral BID  . pantoprazole  40 mg Oral QAC breakfast  . sodium chloride flush  3 mL Intravenous Q12H  . spironolactone  25 mg Oral Daily   Continuous Infusions: . sodium chloride    . cefTRIAXone (ROCEPHIN)  IV 2 g (12/11/19 0923)   PRN Meds: sodium chloride,  acetaminophen, ALPRAZolam, nicotine, ondansetron (ZOFRAN) IV, sodium chloride flush, traZODone   Vital Signs    Vitals:   12/11/19 1000 12/11/19 1122 12/11/19 1200 12/11/19 1210  BP: 138/68     Pulse: 73   76  Resp:      Temp:   98.2 F (36.8 C)   TempSrc:   Oral   SpO2: 95% 94%  96%  Weight:      Height:        Intake/Output Summary (Last 24 hours) at 12/11/2019 1425 Last data filed at 12/11/2019 1300 Gross per 24 hour  Intake 100 ml  Output 3000 ml  Net -2900 ml   Last 3 Weights 12/11/2019 12/10/2019 12/09/2019  Weight (lbs) 253 lb 4.9 oz 253 lb 8.5 oz 259 lb 14.8 oz  Weight (kg) 114.9 kg 115 kg 117.9 kg      Telemetry    Normal sinus rhythm- Personally Reviewed  ECG     - Personally Reviewed  Physical Exam   Constitutional:  oriented to person, place, and time. No distress.  HENT:  Head: Grossly normal Eyes:  no discharge. No scleral icterus.  Neck: No JVD, no carotid bruits  Cardiovascular: Regular rate and rhythm, no murmurs appreciated Pulmonary/Chest: Diffuse Rales Abdominal: Soft.  no distension.  no tenderness.  Musculoskeletal: Normal range of motion Neurological:  normal muscle tone. Coordination normal. No atrophy Skin: Skin warm and dry Psychiatric:  Anxious   Labs    High Sensitivity Troponin:   Recent Labs  Lab 12/07/19 1218 12/07/19 1435  TROPONINIHS 11 12      Chemistry Recent Labs  Lab 12/07/19 1218 12/08/19 0552 12/09/19 0336 12/10/19 0433 12/11/19 0513  NA 136   < > 137 136 134*  K 4.6   < > 4.6 4.9 4.9  CL 91*   < > 90* 87* 89*  CO2 34*   < > 38* 40* 36*  GLUCOSE 114*   < > 84 215* 272*  BUN 38*   < > 50* 46* 42*  CREATININE 1.92*   < > 1.91* 1.92* 1.72*  CALCIUM 9.1   < > 8.5* 8.7* 8.8*  PROT 7.2  --   --   --   --   ALBUMIN 3.8  --   --   --   --   AST 19  --   --   --   --   ALT 22  --   --   --   --   ALKPHOS 25*  --   --   --   --   BILITOT 1.0  --   --   --   --   GFRNONAA 35*   < > 35* 35* 39*  GFRAA 40*   < >  40* 40* 46*  ANIONGAP 11   < > 9 9 9    < > = values in this interval not displayed.     Hematology Recent Labs  Lab 12/09/19 0336 12/10/19 0433 12/11/19 0513  WBC 8.3 6.2 5.6  RBC 4.39 4.56 4.55  HGB 9.4* 9.8* 9.8*  HCT 32.8* 34.1* 34.3*  MCV 74.7* 74.8* 75.4*  MCH 21.4* 21.5* 21.5*  MCHC 28.7* 28.7* 28.6*  RDW 19.7* 19.5* 19.3*  PLT 287 292 278    BNP Recent Labs  Lab 12/07/19 1218 12/08/19 0552  BNP 475.0* 411.0*     DDimer No results for input(s): DDIMER in the last 168 hours.   Radiology    No results found.  Cardiac Studies     Patient Profile     69 year old gentleman with coronary disease, recent bypass surgery March 2021, known carotid arterial disease prior carotid endarterectomy March 2021, postoperative atrial fibrillation, diabetes type 2, smoker, COPD, morbid obesity, chronic kidney disease presenting with worsening shortness of breath  Assessment & Plan    Acute on chronic respiratory failure Multifactorial including underlying COPD/COPD exacerbation, diastolic heart failure, anemia, continues to smoke even following bypass surgery Marked hypoxia in the office 12/07/2019, sent to the emergency room Hypoxia has persisted When breathing by mouth saturations into the 80s, and breathing 10 L by nose able to maintain saturations in the mid 90s -He does seem to have turned the corner with diuresis, broad-spectrum antibiotics with nebulizers, flutter valve, steroids -Saturations much more steady compared to past several days of admission -He responded well to Diamox yesterday, will give another dose today -Long discussion with him again today as with yesterday -Indicated he is not on low enough oxygen to support himself at home.  Best we could probably do his 8 L, but we will try to wean through the course of the day as he continues to improve -Suspect in the next day or 2 he should continue to turn the corner and improved, encouragement given to him to  stay the course and let the medications work -Of note each day has been threatening to leave AMA each day  of arrival on my rounds  Acute on chronic diastolic CHF  poor diet, high water intake at home, in the setting of COPD continued smoking -We have started CHF education this admission, he has poor insight -Worsening renal failure has made diuresis more challenging -Suspect component of cardiorenal syndrome -Creatinine down to 1.7 today, previously 1.9 -We will continue with current diuresis, Diamox, reassess to switch back to Lasix in the next day -----Again he is threatening to leave tomorrow afternoon on today's rounds -Reiterated to him that it would depend on his progress and hypoxia  Acute on chronic renal failure Worse over the past month,  Unclear if this is cardiorenal syndrome versus diabetic nephropathy/ATN Lasix changed to Diamox yesterday with good results, Diamox again today, consider change back to Lasix tomorrow  COPD exacerbation, active smoker Smoking cessation recommended, Has poor insight -Being treated with Pulmicort, nebulizers, steroids, broad-spectrum antibiotics, diuresis  CAD with stable angina, s/p CABG  continue aspirin, Plavix, statin Smoking cessation recommended  Paroxysmal atrial fibrillation Maintaining normal sinus rhythm On metoprolol twice daily High risk of arrhythmia given underlying respiratory distress  Essential hypertension On spironolactone, metoprolol, amlodipine, Lasix Blood pressure stable  Obstructive sleep apnea Need outpatient scheduling Auto titration needed  Dispo: As with yesterday he is insisting on going home, Hospitalist, intensivist have already explained to him that he has severe hypoxia still on 10 L nasal cannula oxygen We are not holding him, wife can come pick him up but it would not be safe and he would have to leave AMA -He is aware, now threatening to leave tomorrow afternoon despite any objective evidence  that he would be more stable -I have discussed this with him as well Again there is no family at the bedside on any of the days I have been rounding.  He continues to falsely accused [place] of not doing anything for him, making his kidneys worse, if we would only " fixed it last week when he call the office".  Of note there was no phone call to the office documented in our computer system.  There was phone notes to primary care indicating they were titrating up on his Lasix and he was even going above the dose they were recommending, taking Lasix 40 twice daily on his own   CRITICAL CARE Performed by: Esmond Plants, MD  Total critical care time: 45 minutes  Critical care time was exclusive of separately billable procedures and treating other patients.  Critical care was necessary to treat or prevent imminent or life-threatening deterioration.  Critical care was time spent personally by me (independent of midlevel providers or residents) on the following activities: development of treatment plan with patient and/or surrogate as well as nursing, discussions with consultants, evaluation of patient's response to treatment, examination of patient, obtaining history from patient or surrogate, ordering and performing treatments and interventions, ordering and review of laboratory studies, ordering and review of radiographic studies, pulse oximetry and re-evaluation of patient's condition.   For questions or updates, please contact [place] Please consult [email or web address] for contact info under        Signed, Ida Rogue, MD  12/11/2019, 2:25 PM

## 2019-12-11 NOTE — Progress Notes (Signed)
PROGRESS NOTE    Joshua Roy  STM:[id number] DOB: 07-12-1951 DOA: 12/07/2019 PCP: [name], MD    Assessment & Plan:   Principal Problem:   Acute on chronic diastolic CHF (congestive heart failure) ([place]) Active Problems:   DISORDER, TOBACCO USE   Essential hypertension   COPD (chronic obstructive pulmonary disease) ([place])   CAD (coronary artery disease)   Morbid obesity ([place])   Acute on chronic respiratory failure with hypercapnia ([place])    Joshua Roy is a 69 y.o. Caucasian male with medical history significant for coronary artery disease status post recent four-vessel CABG, carotid disease status post recent carotid endarterectomy, history of A. 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 Patient complains of easy fatigability as well as trouble sleeping.  He also complains of dyspnea with exertion, bilateral lower extremity swelling, increased abdominal girth, orthopnea and a 14 pound weight gain since his last hospitalization.  He reports compliance with his medications but does not think he has been compliant with his diet.  He continues to smoke cigarettes.  Patient has had an increase in the dose of his diuretic from 40 mg daily to 40 mg in the morning and 20 in the afternoon by his primary care provider because of his significant weight gain. While in the office he was noted to have a pulse oximetry of 68% on room air, patient is not on home oxygen.  He was sent to the emergency room for further evaluation.   Acute on chronic diastolic dysfunction CHF Last known LVEF of 60 - 65% Patient presents for evaluation of exertional SOB, lower extremity swelling and 14lb weight gain --Cardiology consulted PLAN: --Diamox, reassess to switch back to Lasix in the next day, per cards --continue  Aldactone  Acute on chronic respiratory failure with hypercapnia Likely OSA Patient was hypoxic in the field with pulse oximetry in the low 70s and also had increased work of breathing Acute worsening secondary to acute CHF exacerbation He required noninvasive mechanical ventilation to reduce his work of breathing and improve oxygenation Arterial blood gas showed uncompensated respiratory acidosis with hypercarbia --procal neg. --Continue BiPAP nightly   COPD exacerbation Since pt was not improving with diuretic, started treatment for exacerbation. 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 --SSI TID (no bed-time coverage, unless eating a meal at bedtime)   Paroxysmal atrial fibrillation Maintaining normal sinus rhythm Continue metoprolol twice daily   DVT prophylaxis: Lovenox SQ Code Status: Full code  Family Communication: Wife updated on speaker phone at bedside today Status is: inpatient Dispo:   The patient is from: home Anticipated d/c is to: home Anticipated d/c date is: unclear Patient currently is not medically stable to d/c due to: still on 10L high flow.   Subjective and Interval History:  O2 requirement continued to remain high.  No fever, N/V/D.  Pt again talked about  leaving AMA.  Multiple physician providers were involved in explaining to him why he can't leave the hospital now, and the consequences of him leaving AMA now.       Objective: Vitals:   12/11/19 1300 12/11/19 1400 12/11/19 1500 12/11/19 1524  BP:  126/64    Pulse: 68 65 72   Resp:  (!) 21    Temp:      TempSrc:      SpO2: 94% 95% 96% 95%  Weight:      Height:        Intake/Output Summary (Last 24 hours) at 12/11/2019 1602 Last data filed at 12/11/2019 1500 Gross per 24 hour  Intake --  Output 3200 ml  Net -3200 ml   Filed Weights   12/09/19 0500 12/10/19 0344 12/11/19 0500  Weight: 117.9 kg 115 kg 114.9 kg    Examination:   Constitutional: NAD, AAOx3 HEENT: conjunctivae and lids normal, EOMI CV: No cyanosis.   RESP: Coarse rhonchi, on 10L, sating in high 80's GI: +BS, NTND Extremities: 1+ pitting edema up to knees in BLE, improved from prior SKIN: warm, dry and intact Neuro: II - XII grossly intact.  Sensation intact   Data Reviewed: I have personally reviewed following labs and imaging studies  CBC: Recent Labs  Lab 12/07/19 1218 12/08/19 0552 12/09/19 0336 12/10/19 0433 12/11/19 0513  WBC 9.3 7.5 8.3 6.2 5.6  NEUTROABS 7.0  --   --   --   --   HGB 10.6* 10.0* 9.4* 9.8* 9.8*  HCT 37.1* 34.5* 32.8* 34.1* 34.3*  MCV 76.3* 74.5* 74.7* 74.8* 75.4*  PLT 322 284 287 292 867   Basic Metabolic Panel: Recent Labs  Lab 12/07/19 1218 12/08/19 0552 12/09/19 0336 12/10/19 0433 12/11/19 0513  NA 136 138 137 136 134*  K 4.6 4.4 4.6 4.9 4.9  CL 91* 92* 90* 87* 89*  CO2 34* 37* 38* 40* 36*  GLUCOSE 114* 139* 84 215* 272*  BUN 38* 39* 50* 46* 42*  CREATININE 1.92* 1.96* 1.91* 1.92* 1.72*  CALCIUM 9.1 8.7* 8.5* 8.7* 8.8*  MG  --   --  2.2 2.2 2.3   GFR: Estimated Creatinine Clearance: 50.8 mL/min (A) (by C-G formula based on SCr of 1.72 mg/dL (H)). Liver Function Tests: Recent Labs  Lab 12/07/19 1218  AST 19  ALT 22  ALKPHOS 25*  BILITOT 1.0  PROT 7.2  ALBUMIN 3.8   No results for input(s): LIPASE, AMYLASE in the last 168 hours. No results for input(s): AMMONIA in the last 168 hours. Coagulation  Profile: Recent Labs  Lab 12/07/19 1218  INR 1.2   Cardiac Enzymes: No results for input(s): CKTOTAL, CKMB, CKMBINDEX, TROPONINI in the last 168 hours. BNP (last 3 results) No results for input(s): PROBNP in the last 8760 hours. HbA1C: No results for input(s): HGBA1C in the last 72 hours. CBG: Recent Labs  Lab 12/10/19 1541 12/10/19 2159 12/11/19 0842 12/11/19 1135 12/11/19 1557  GLUCAP 270* 289* 222* 281* 272*   Lipid Profile: No results for input(s): CHOL, HDL, LDLCALC, TRIG, CHOLHDL, LDLDIRECT in the last 72 hours. Thyroid Function Tests: No results for input(s): TSH, T4TOTAL, FREET4, T3FREE, THYROIDAB in the last 72 hours. Anemia Panel: No results for input(s): VITAMINB12, FOLATE, FERRITIN, TIBC, IRON, RETICCTPCT in the last 72 hours. Sepsis Labs: Recent Labs  Lab 12/09/19 0336  PROCALCITON <0.10    Recent Results (from the past 240 hour(s))  SARS Coronavirus 2 by RT PCR (hospital order, performed in [facility] hospital lab) Nasopharyngeal Nasopharyngeal Swab     Status: None   Collection Time: 12/07/19 12:18 PM   Specimen: Nasopharyngeal Swab  Result Value Ref Range Status   SARS Coronavirus 2 NEGATIVE NEGATIVE Final    Comment: (NOTE) SARS-CoV-2 target nucleic acids are NOT DETECTED. The SARS-CoV-2 RNA is generally detectable in upper and lower respiratory specimens during the acute phase of infection. The lowest concentration of SARS-CoV-2 viral copies this assay can detect is 250 copies / mL. A negative result does not preclude SARS-CoV-2 infection and should not be used as the sole basis for treatment or other patient management decisions.  A negative result may occur with improper specimen collection / handling, submission of specimen other than nasopharyngeal swab, presence of viral mutation(s) within the areas targeted by this assay, and inadequate number of viral copies (<250 copies / mL). A negative result must be combined with  clinical observations, patient history, and epidemiological information. Fact Sheet for Patients:   StrictlyIdeas.no Fact Sheet for Healthcare Providers: BankingDealers.co.za This test is not yet approved or cleared  by the [place] FDA and has been authorized for detection and/or diagnosis of SARS-CoV-2 by FDA under an Emergency Use Authorization (EUA).  This EUA will remain in effect (meaning this test can be used) for the duration of the COVID-19 declaration under Section 564(b)(1) of the Act, 21 U.S.C. section 360bbb-3(b)(1), unless the authorization is terminated or revoked sooner. Performed at [facility], [address], [place],  [address]       Radiology Studies: No results found.   Scheduled Meds: . acetaZOLAMIDE  500 mg Oral BID  . amLODipine  10 mg Oral Daily  . aspirin EC  81 mg Oral Daily  . atorvastatin  80 mg Oral Daily  . azithromycin  500 mg Oral Daily  . budesonide (PULMICORT) nebulizer solution  0.5 mg Nebulization BID  . Chlorhexidine Gluconate Cloth  6 each Topical Daily  . clopidogrel  75 mg Oral Daily  . enoxaparin (LOVENOX) injection  40 mg Subcutaneous Q24H  . fenofibrate  160 mg Oral Daily  . insulin aspart  0-15 Units Subcutaneous TID WC  . insulin aspart  8 Units Subcutaneous TID WC  . ipratropium-albuterol  3 mL Nebulization Q4H  . methylPREDNISolone (SOLU-MEDROL) injection  40 mg Intravenous Q12H  . metoprolol tartrate  25 mg Oral BID  . pantoprazole  40 mg Oral QAC breakfast  . sodium chloride flush  3 mL Intravenous Q12H  . spironolactone  25 mg Oral Daily   Continuous Infusions: . sodium chloride    . cefTRIAXone (ROCEPHIN)  IV 2 g (12/11/19 5409)     LOS: 4 days     Enzo Bi, MD [facility] If 7PM-7AM, please contact night-coverage 12/11/2019, 4:02 PM

## 2019-12-12 ENCOUNTER — Telehealth: Payer: Self-pay | Admitting: Cardiovascular Disease

## 2019-12-12 DIAGNOSIS — Z7189 Other specified counseling: Secondary | ICD-10-CM

## 2019-12-12 DIAGNOSIS — Z515 Encounter for palliative care: Secondary | ICD-10-CM

## 2019-12-12 LAB — GLUCOSE, CAPILLARY
Glucose-Capillary: 142 mg/dL — ABNORMAL HIGH (ref 70–99)
Glucose-Capillary: 150 mg/dL — ABNORMAL HIGH (ref 70–99)
Glucose-Capillary: 163 mg/dL — ABNORMAL HIGH (ref 70–99)
Glucose-Capillary: 231 mg/dL — ABNORMAL HIGH (ref 70–99)
Glucose-Capillary: 277 mg/dL — ABNORMAL HIGH (ref 70–99)

## 2019-12-12 LAB — BASIC METABOLIC PANEL
Anion gap: 9 (ref 5–15)
BUN: 41 mg/dL — ABNORMAL HIGH (ref 8–23)
CO2: 34 mmol/L — ABNORMAL HIGH (ref 22–32)
Calcium: 9 mg/dL (ref 8.9–10.3)
Chloride: 92 mmol/L — ABNORMAL LOW (ref 98–111)
Creatinine, Ser: 1.72 mg/dL — ABNORMAL HIGH (ref 0.61–1.24)
GFR calc Af Amer: 46 mL/min — ABNORMAL LOW (ref >60)
GFR calc non Af Amer: 39 mL/min — ABNORMAL LOW (ref >60)
Glucose, Bld: 180 mg/dL — ABNORMAL HIGH (ref 70–99)
Potassium: 4.4 mmol/L (ref 3.5–5.1)
Sodium: 135 mmol/L (ref 135–145)

## 2019-12-12 MED ORDER — ENOXAPARIN SODIUM 40 MG/0.4ML ~~LOC~~ SOLN
40.0000 mg | SUBCUTANEOUS | Status: DC
  Administered 2019-12-12 – 2019-12-13 (×2): 40 mg via SUBCUTANEOUS
  Filled 2019-12-12 (×2): qty 0.4

## 2019-12-12 MED ORDER — FUROSEMIDE 10 MG/ML IJ SOLN
60.0000 mg | Freq: Two times a day (BID) | INTRAMUSCULAR | Status: DC
  Administered 2019-12-12 – 2019-12-14 (×5): 60 mg via INTRAVENOUS
  Filled 2019-12-12 (×5): qty 6

## 2019-12-12 MED ORDER — INSULIN ASPART 100 UNIT/ML ~~LOC~~ SOLN
15.0000 [IU] | Freq: Three times a day (TID) | SUBCUTANEOUS | Status: DC
  Administered 2019-12-12 – 2019-12-14 (×6): 15 [IU] via SUBCUTANEOUS
  Filled 2019-12-12 (×8): qty 1

## 2019-12-12 NOTE — Progress Notes (Signed)
CRITICAL CARE PROGRESS NOTE    Name: Joshua Roy MRN: [id number] DOB: 04-13-1951     LOS: 5   SUBJECTIVE FINDINGS & SIGNIFICANT EVENTS   Patient description:  Admitted forAcute Hypoxic Hypercapnic Respiratory Failure secondary to Acute Decompensated HFpEF&presumed Obstructive Sleep Apnea  Lines / Drains: PIVx2   Protocols / Consultants: Cardiology , pccm , hospitalist service   Overnight:  Palliative care for [place] - patient wants to go home and smoke and he does not wish to use supplemental O2   PAST MEDICAL HISTORY   Past Medical History:  Diagnosis Date  . (HFpEF) heart failure with preserved ejection fraction ([place])    a. 07/2019 Echo: EF 60-65%. Mod LVH. Gr1 DD. No rwma. Nl RV size/fxn. Mildly dil LA.  Marland Kitchen ACE-inhibitor cough   . Anxiety   . Bronchitis 06/27/2016  . Carotid arterial disease ([place])    a. 09/2019 Carotid U/S: RICA 62-95%, LICA 2-84%; b. 07/3242 R CEA.  . CKD (chronic kidney disease), stage III   . COPD (chronic obstructive pulmonary disease) (HCC)    cath, mild inf. hypokinesis EF 49%, 100% ROA  . Coronary artery disease    a. 2001 Cath: LM 10, LAD 60p/m, D1 30, LCX 20p, RCA 190m/d-->Med Rx; b. 05/2017 Ex Mv: Ex time 5:46, antlat twi @ rest, fixed infarct w/ mild peri-infarct ischemia. EF 38%; c. 08/2019 Cath: LM 40ost, 70d, LAD 83m, LCX 10m, RCA 80-90p, 122m; d. 09/2019 CABG x4: LIMA->LAD, VG->RI, VG->OM, VG->RPDA.  . Diabetes mellitus type II   . Hyperlipidemia   . Hypertension   . MI (myocardial infarction) ([place]) age 1  . OA (osteoarthritis)    knee OA, injected 2012 by ortho  . PAF (paroxysmal atrial fibrillation) ([place])    a. 09/2019 post-op CABG-->[facility].  . Pneumonia   . PSVT (paroxysmal supraventricular tachycardia) ([place])    a. 08/2019 Zio: 189 brief runs of SVT  (fastest 174 x 6 beats, longest 15 beats @ 107).  . Tobacco abuse      SURGICAL HISTORY   Past Surgical History:  Procedure Laterality Date  . CARDIAC CATHETERIZATION  05/06/2000   @ [place]  . COLONOSCOPY WITH PROPOFOL N/A 04/20/2018   Procedure: COLONOSCOPY WITH PROPOFOL;  Surgeon: [name], MD;  Location[facility] ENDOSCOPY;  Service: Endoscopy;  Laterality: N/A;  . CORONARY ARTERY BYPASS GRAFT N/A 10/07/2019   Procedure: CORONARY ARTERY BYPASS GRAFTING (CABG) using LIMA to LAD; Endoscopic harvesting of left greater saphenous vein: SVG to RAMUS; SVG to OM1; SVG to PDA.;  Surgeon: Gaye Pollack, MD;  Location: [place];  Service: Open Heart Surgery;  Laterality: N/A;  . ENDARTERECTOMY Right 10/07/2019   Procedure: ENDARTERECTOMY CAROTID;  Surgeon: Rosetta Posner, MD;  Location: [facility] OR;  Service: Vascular;  Laterality: Right;  . ENDOVEIN HARVEST OF GREATER SAPHENOUS VEIN Left 10/07/2019   Procedure: [place] Of Greater Saphenous Vein;  Surgeon: Gaye Pollack, MD;  Location: [facility] OR;  Service: Open Heart Surgery;  Laterality: Left;  . ESOPHAGOGASTRODUODENOSCOPY ENDOSCOPY    . KNEE ARTHROSCOPY  07/25/04   left  . [place]   right, open surgery- repair  . RIGHT/LEFT HEART CATH AND CORONARY ANGIOGRAPHY Bilateral 09/08/2019   Procedure: RIGHT/LEFT HEART CATH AND CORONARY ANGIOGRAPHY;  Surgeon: Minna Merritts, MD;  Location: [place] CV LAB;  Service: Cardiovascular;  Laterality: Bilateral;  . TEE WITHOUT CARDIOVERSION N/A 10/07/2019   Procedure: TRANSESOPHAGEAL ECHOCARDIOGRAM (TEE);  Surgeon: Gaye Pollack, MD;  Location: [facility]  OR;  Service: Open Heart Surgery;  Laterality: N/A;  . 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 TOTAL KNEE ARTHROPLASTY Right 07/13/2017   Procedure: RIGHT TOTAL KNEE ARTHROPLASTY;  Surgeon: Gaynelle Arabian, MD;   Location: [facility] ORS;  Service: Orthopedics;  Laterality: Right;     FAMILY HISTORY   Family History  Problem Relation Age of Onset  . Heart disease Father        CAD, MI x 3  . Hypertension Father   . Alcohol abuse Father   . Diabetes Father   . Diabetes Sister   . Cancer Brother        lymphoma, in remission  . Colon cancer Brother   . Heart disease Sister        CABG x 3  . Prostate cancer Neg Hx      SOCIAL HISTORY   Social History   Tobacco Use  . Smoking status: Former Smoker    Packs/day: 1.00    Years: 40.00    Pack years: 40.00    Quit date: 10/07/2019    Years since quitting: 0.1  . Smokeless tobacco: Never Used  . Tobacco comment: per patient wears a patch and has cut down on cigarettes/day (10/05/2019)  Substance Use Topics  . Alcohol use: Yes    Alcohol/week: 0.0 standard drinks    Comment: 5-6 cocktails, 1 times a week  . Drug use: No     MEDICATIONS   Current Medication:  Current Facility-Administered Medications:  .  0.9 %  sodium chloride infusion, 250 mL, Intravenous, PRN, Agbata, Tochukwu, MD .  acetaminophen (TYLENOL) tablet 1,000 mg, 1,000 mg, Oral, Q8H PRN, Agbata, Tochukwu, MD .  acetaZOLAMIDE (DIAMOX) tablet 500 mg, 500 mg, Oral, BID, Kasa, Kurian, MD, 500 mg at 12/11/19 2155 .  ALPRAZolam Duanne Moron) tablet 0.25 mg, 0.25 mg, Oral, BID PRN, Enzo Bi, MD, 0.25 mg at 12/11/19 2154 .  amLODipine (NORVASC) tablet 10 mg, 10 mg, Oral, Daily, Agbata, Tochukwu, MD, 10 mg at 12/11/19 0920 .  aspirin EC tablet 81 mg, 81 mg, Oral, Daily, Agbata, Tochukwu, MD, 81 mg at 12/11/19 0920 .  atorvastatin (LIPITOR) tablet 80 mg, 80 mg, Oral, Daily, Agbata, Tochukwu, MD, 80 mg at 12/11/19 0920 .  azithromycin (ZITHROMAX) tablet 500 mg, 500 mg, Oral, Daily, Enzo Bi, MD, 500 mg at 12/11/19 0920 .  budesonide (PULMICORT) nebulizer solution 0.5 mg, 0.5 mg, Nebulization, BID, Kasa, Kurian, MD, 0.5 mg at 12/12/19 0804 .  cefTRIAXone (ROCEPHIN) 2 g in sodium chloride  0.9 % 100 mL IVPB, 2 g, Intravenous, Daily, Flora Lipps, MD, Stopped at 12/11/19 0953 .  Chlorhexidine Gluconate Cloth 2 % PADS 6 each, 6 each, Topical, Daily, Enzo Bi, MD, 6 each at 12/11/19 2156 .  clopidogrel (PLAVIX) tablet 75 mg, 75 mg, Oral, Daily, Agbata, Tochukwu, MD, 75 mg at 12/11/19 0920 .  enoxaparin (LOVENOX) injection 40 mg, 40 mg, Subcutaneous, Q24H, Enzo Bi, MD .  fenofibrate tablet 160 mg, 160 mg, Oral, Daily, Agbata, Tochukwu, MD, 160 mg at 12/11/19 0920 .  insulin aspart (novoLOG) injection 0-15 Units, 0-15 Units, Subcutaneous, TID WC, Enzo Bi, MD, 3 Units at 12/12/19 0749 .  insulin aspart (novoLOG) injection 15 Units, 15 Units, Subcutaneous, TID WC, Enzo Bi, MD .  ipratropium-albuterol (DUONEB) 0.5-2.5 (3) MG/3ML nebulizer solution 3 mL, 3 mL, Nebulization, Q4H, Enzo Bi, MD, 3 mL  at 12/12/19 0804 .  methylPREDNISolone sodium succinate (SOLU-MEDROL) 40 mg/mL injection 40 mg, 40 mg, Intravenous, Q12H, Enzo Bi, MD, 40 mg at 12/11/19 2156 .  metoprolol tartrate (LOPRESSOR) tablet 25 mg, 25 mg, Oral, BID, Agbata, Tochukwu, MD, 25 mg at 12/11/19 2154 .  nicotine (NICODERM CQ - dosed in mg/24 hours) patch 21 mg, 21 mg, Transdermal, Daily PRN, Agbata, Tochukwu, MD .  ondansetron (ZOFRAN) injection 4 mg, 4 mg, Intravenous, Q6H PRN, Agbata, Tochukwu, MD .  pantoprazole (PROTONIX) EC tablet 40 mg, 40 mg, Oral, QAC breakfast, Agbata, Tochukwu, MD, 40 mg at 12/12/19 0749 .  sodium chloride flush (NS) 0.9 % injection 3 mL, 3 mL, Intravenous, Q12H, Agbata, Tochukwu, MD, 3 mL at 12/11/19 2218 .  sodium chloride flush (NS) 0.9 % injection 3 mL, 3 mL, Intravenous, PRN, Agbata, Tochukwu, MD .  spironolactone (ALDACTONE) tablet 25 mg, 25 mg, Oral, Daily, Agbata, Tochukwu, MD, 25 mg at 12/11/19 0920 .  traZODone (DESYREL) tablet 50 mg, 50 mg, Oral, QHS PRN, Bradly Bienenstock, NP, 50 mg at 12/11/19 2154    ALLERGIES   Ramipril and Mucinex d [pseudoephedrine-guaifenesin  er]    REVIEW OF SYSTEMS     10 point ROS done and is negative except patient wishes to be d/cd home  PHYSICAL EXAMINATION   Vital Signs: Temp:  [98.2 F (36.8 C)-98.6 F (37 C)] 98.6 F (37 C) (05/16 1600) Pulse Rate:  [65-82] 68 (05/17 0200) Resp:  [17-28] 17 (05/17 0000) BP: (114-148)/(56-99) 114/60 (05/17 0200) SpO2:  [82 %-99 %] 97 % (05/17 0804) FiO2 (%):  [93 %] 93 % (05/16 1937)  GENERAL:NAD HEAD: Normocephalic, atraumatic.  EYES: Pupils equal, round, reactive to light.  No scleral icterus.  MOUTH: Moist mucosal membrane. NECK: Supple. No thyromegaly. No nodules. No JVD.  PULMONARY: mild rhonchi bilaterally  CARDIOVASCULAR: S1 and S2. Regular rate and rhythm. No murmurs, rubs, or gallops.  GASTROINTESTINAL: Soft, nontender, non-distended. No masses. Positive bowel sounds. No hepatosplenomegaly.  MUSCULOSKELETAL: No swelling, clubbing, or edema.  NEUROLOGIC: Mild distress due to acute illness SKIN:intact,warm,dry   PERTINENT DATA     Infusions: . sodium chloride    . cefTRIAXone (ROCEPHIN)  IV Stopped (12/11/19 6770)   Scheduled Medications: . acetaZOLAMIDE  500 mg Oral BID  . amLODipine  10 mg Oral Daily  . aspirin EC  81 mg Oral Daily  . atorvastatin  80 mg Oral Daily  . azithromycin  500 mg Oral Daily  . budesonide (PULMICORT) nebulizer solution  0.5 mg Nebulization BID  . Chlorhexidine Gluconate Cloth  6 each Topical Daily  . clopidogrel  75 mg Oral Daily  . enoxaparin (LOVENOX) injection  40 mg Subcutaneous Q24H  . fenofibrate  160 mg Oral Daily  . insulin aspart  0-15 Units Subcutaneous TID WC  . insulin aspart  15 Units Subcutaneous TID WC  . ipratropium-albuterol  3 mL Nebulization Q4H  . methylPREDNISolone (SOLU-MEDROL) injection  40 mg Intravenous Q12H  . metoprolol tartrate  25 mg Oral BID  . pantoprazole  40 mg Oral QAC breakfast  . sodium chloride flush  3 mL Intravenous Q12H  . spironolactone  25 mg Oral Daily   PRN  Medications: sodium chloride, acetaminophen, ALPRAZolam, nicotine, ondansetron (ZOFRAN) IV, sodium chloride flush, traZODone Hemodynamic parameters:   Intake/Output: 05/16 0701 - 05/17 0700 In: 100 [IV Piggyback:100] Out: 2675 [Urine:2675]  Ventilator  Settings: FiO2 (%):  [93 %] 93 %    LAB RESULTS:  Basic Metabolic Panel: Recent Labs  Lab 12/08/19 0552 12/08/19 0552 12/09/19 0336 12/09/19 0336 12/10/19 0433 12/10/19 0433 12/11/19 0513 12/12/19 0512  NA 138  --  137  --  136  --  134* 135  K 4.4   < > 4.6   < > 4.9   < > 4.9 4.4  CL 92*  --  90*  --  87*  --  89* 92*  CO2 37*  --  38*  --  40*  --  36* 34*  GLUCOSE 139*  --  84  --  215*  --  272* 180*  BUN 39*  --  50*  --  46*  --  42* 41*  CREATININE 1.96*  --  1.91*  --  1.92*  --  1.72* 1.72*  CALCIUM 8.7*  --  8.5*  --  8.7*  --  8.8* 9.0  MG  --   --  2.2  --  2.2  --  2.3  --    < > = values in this interval not displayed.   Liver Function Tests: Recent Labs  Lab 12/07/19 1218  AST 19  ALT 22  ALKPHOS 25*  BILITOT 1.0  PROT 7.2  ALBUMIN 3.8   No results for input(s): LIPASE, AMYLASE in the last 168 hours. No results for input(s): AMMONIA in the last 168 hours. CBC: Recent Labs  Lab 12/07/19 1218 12/08/19 0552 12/09/19 0336 12/10/19 0433 12/11/19 0513  WBC 9.3 7.5 8.3 6.2 5.6  NEUTROABS 7.0  --   --   --   --   HGB 10.6* 10.0* 9.4* 9.8* 9.8*  HCT 37.1* 34.5* 32.8* 34.1* 34.3*  MCV 76.3* 74.5* 74.7* 74.8* 75.4*  PLT 322 284 287 292 278   Cardiac Enzymes: No results for input(s): CKTOTAL, CKMB, CKMBINDEX, TROPONINI in the last 168 hours. BNP: Invalid input(s): POCBNP CBG: Recent Labs  Lab 12/11/19 1557 12/11/19 1940 12/11/19 2149 12/12/19 0019 12/12/19 0723  GLUCAP 272* 326* 357* 231* 150*      ASSESSMENT AND PLAN    -Multidisciplinary rounds held today  Acute Hypoxic Respiratory Failure -due to pulmonary edema and OSA -continue COPD carepath -solumedrol   duonebs   Acute on chronic diastolic heart failure -cardio on case - appreciate input- continue diuresis 60 bid lasxi -oxygen as needed -Lasix as tolerated -follow up cardiac enzymes as indicated ICU monitoring  Acute on chronic Renal Failure  - due to hypertensive and diabetic nephropathy -follow chem 7 -follow UO -continue Foley Catheter-assess need daily  ID -continue IV abx as prescibed -follow up cultures  GI/Nutrition GI PROPHYLAXIS as indicated DIET-->TF's as tolerated Constipation protocol as indicated  ENDO - ICU hypoglycemic\Hyperglycemia protocol -check FSBS per protocol   ELECTROLYTES -follow labs as needed -replace as needed -pharmacy consultation   DVT/GI PRX ordered -SCDs  TRANSFUSIONS AS NEEDED MONITOR FSBS ASSESS the need for LABS as needed   Critical care provider statement:    Critical care time (minutes):  33   Critical care time was exclusive of:  Separately billable procedures and treating other patients   Critical care was necessary to treat or prevent imminent or life-threatening deterioration of the following conditions:  acute on chronic CHF, copd, osa, af, multiple comorbid conditions.    Critical care was time spent personally by me on the following activities:  Development of treatment plan with patient or surrogate, discussions with consultants, evaluation of patient's response to treatment, examination of patient, obtaining history from patient or surrogate, ordering and performing treatments and  interventions, ordering and review of laboratory studies and re-evaluation of patient's condition.  I assumed direction of critical care for this patient from another provider in my specialty: no    This document was prepared using Dragon voice recognition software and may include unintentional dictation errors.    Ottie Glazier, M.D.  Division of [place]

## 2019-12-12 NOTE — Telephone Encounter (Signed)
Patient wife was called by someone at the hospital and notified of need for hospice services.     She feels like she is out of the loop and is not sure what the real status of his condition is .    Please call to discuss

## 2019-12-12 NOTE — Consult Note (Signed)
Consultation Note Date: 12/12/2019   Patient Name: Joshua Roy  DOB: 12-02-1950  MRN: [id number]  Age / Sex: 69 y.o., male  PCP: [name], MD Referring Physician: Dr. Mortimer Fries  Reason for Consultation: Establishing goals of care  HPI/Patient Profile: 69 y.o. male  with past medical history of CAD s/p recent four-vessel CABG, afib, HTN, DM, CKD stage III, paroxysmal SVT, smoker admitted on 12/07/2019 from cardiology due to hypoxia 68% on room air in the office. Patient complains of dyspnea with exertion, bilateral lower extremity swelling, orthopnea, and 14 lb weight gain since last admission. Hospital admission for acute on chronic respiratory failure secondary to decompensated CHF, presumed OSA/COPD, worsening kidney function possibly cardiorenal syndrome. Hx of non-compliance. Continues to tell staff he wishes to leave AMA. Palliative medicine consultation for goals of care.   Clinical Assessment and Goals of Care:  I have reviewed medical records and discussed during multidisciplinary rounds including Dr. Lanney Gins and RN. Patient with hx of non-compliance and threatening to leave AMA this weekend. It was agreed that if patient continues to be non-compliant and wishes to go home, home with hospice may be an option. Reassured the team that I would discuss [place] with patient and wife.  Met with patient at bedside to discuss goals of care. Shortly after, spoke with wife Joshua Roy) via telephone.  I introduced Palliative Medicine as specialized medical care for people living with serious illness. It focuses on providing relief from the symptoms and stress of a serious illness. The goal is to improve quality of life for both the patient and the family.  We discussed a brief life review of the patient. Lives home with wife of 71 years. He worked in the Theatre manager. Children and grandchildren. Recently had CABG  in March 2021. Patient reports continued smoking but has been trying to stop. Prior to admission, ambulatory and able to complete ADL's. HS oxygen prn.   Therapeutic listening as patient spends time expressing his concerns of events leading up to admission including his weight gain and attempts of getting into cardiology clinic sooner than scheduled appointment.   Explored his thoughts through the weekend and wanting to leave AMA. Patient is eager for discharge but shares after his conversation with Dr. Rockey Situ, he decided to stay. He does feel that this could be managed outpatient.   Attempted to discuss the severity of his multiorgan dysfunction, irreversible conditions, importance of compliance with medical management, and smoking cessation. Strongly encouraged the patient to stay as long as recommended by the providers in order to medical optimize him/tune him up the best we can before discharge. Discussed in detail his diagnoses, interventions, plan of care.   Dr. Fletcher Anon at bedside during visit. Hopeful to transfer him out of ICU today. Possible d/c home tomorrow evening but may need to stay until Wednesday.   Patient continues to discuss that he plans to get outpatient sleep study done and will wear home CPAP. Explained that this will have to be rescheduled when he is discharged. Again provided  education on multiple chronic conditions contributing to his current status not just OSA/COPD.   Throughout conversation, patient is just focused on when he will be discharged. His wife is not at bedside but he is ok with me calling her and providing update.   **This afternoon, spoke with wife Joshua Roy) via telephone. Introduced role of palliative medicine. Discussed in detail diagnoses, interventions, plan of care, recommendations from providers and hope that [name] will stay hospitalized in order for medical optimization. Also explained importance of compliance with medical management, outpatient  follow-up, diet, fluid restriction, smoking cessation. Wife does share her understanding that this will be a "viscious cycle" with chronic conditions but that she does not feel [place] understands this completely. Also, that he will do what he wants to do and it will be like 'pulling teeth' if he does require recurrent hospitalization. She does emphasize importance of "quality not quantity" of life for [name]. Encouraged ongoing discussions regarding his goals and wishes. Again discussed importance of compliance. Briefly mentioned hospice support and eligibility if he does not stay complaint with treatment and/or does not wish to continuously be hospitalized for his conditions. Recommended outpatient palliative follow-up which wife is agreeable with. Answered questions.    SUMMARY OF RECOMMENDATIONS    Continue current plan of care and medical management. Patient willing to stay admitted per recommendations from cardiology for further diuresis.   Continue education on smoking cessation, diet/fluid restriction.  Patient plans to reschedule outpatient sleep study.   Outpatient cardiology follow-up.  Outpatient palliative referral for ongoing support.   Code Status/Advance Care Planning:  DNR  Symptom Management:   Per attending  Palliative Prophylaxis:   Bowel Regimen, Delirium Protocol and Frequent Pain Assessment  Psycho-social/Spiritual:   Desire for further Chaplaincy support: yes  Additional Recommendations: Caregiving  Support/Resources and Compassionate Wean Education  Prognosis:   Unable to determine  Discharge Planning: To Be Determined      Primary Diagnoses: Present on Admission: . DISORDER, TOBACCO USE . Essential hypertension . COPD (chronic obstructive pulmonary disease) ([place]) . CAD (coronary artery disease) . Morbid obesity ([place]) . Acute on chronic diastolic CHF (congestive heart failure) ([place]) . Acute on chronic respiratory failure with hypercapnia  ([place])   I have reviewed the medical record, interviewed the patient and family, and examined the patient. The following aspects are pertinent.  Past Medical History:  Diagnosis Date  . (HFpEF) heart failure with preserved ejection fraction ([place])    a. 07/2019 Echo: EF 60-65%. Mod LVH. Gr1 DD. No rwma. Nl RV size/fxn. Mildly dil LA.  Marland Kitchen ACE-inhibitor cough   . Anxiety   . Bronchitis 06/27/2016  . Carotid arterial disease ([place])    a. 09/2019 Carotid U/S: RICA 28-78%, LICA 6-76%; b. 01/2093 R CEA.  . CKD (chronic kidney disease), stage III   . COPD (chronic obstructive pulmonary disease) (HCC)    cath, mild inf. hypokinesis EF 49%, 100% ROA  . Coronary artery disease    a. 2001 Cath: LM 10, LAD 60p/m, D1 30, LCX 20p, RCA 124md-->Med Rx; b. 05/2017 Ex Mv: Ex time 5:46, antlat twi @ rest, fixed infarct w/ mild peri-infarct ischemia. EF 38%; c. 08/2019 Cath: LM 40ost, 70d, LAD 715mLCX 8080mCA 80-90p, 100m48m 09/2019 CABG x4: LIMA->LAD, VG->RI, VG->OM, VG->RPDA.  . Diabetes mellitus type II   . Hyperlipidemia   . Hypertension   . MI (myocardial infarction) (HCC)[place]une 48  85OA (osteoarthritis)    knee OA, injected 2012 by ortho  .  PAF (paroxysmal atrial fibrillation) ([place])    a. 09/2019 post-op CABG-->[facility].  . Pneumonia   . PSVT (paroxysmal supraventricular tachycardia) ([place])    a. 08/2019 Zio: 189 brief runs of SVT (fastest 174 x 6 beats, longest 15 beats @ 107).  . Tobacco abuse    Social History   Socioeconomic History  . Marital status: Married    Spouse name: Not on file  . Number of children: 3  . Years of education: Not on file  . Highest education level: Not on file  Occupational History  . Occupation: Engineer, manufacturing, Estate agent  Tobacco Use  . Smoking status: Former Smoker    Packs/day: 1.00    Years: 40.00    Pack years: 40.00    Quit date: 10/07/2019    Years since quitting: 0.1  . Smokeless tobacco: Never Used  . Tobacco comment: per patient wears a patch and has  cut down on cigarettes/day (10/05/2019)  Substance and Sexual Activity  . Alcohol use: Yes    Alcohol/week: 0.0 standard drinks    Comment: 5-6 cocktails, 1 times a week  . Drug use: No  . Sexual activity: Not on file  Other Topics Concern  . Not on file  Social History Narrative   From [place].  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   Social Determinants of Health   Financial Resource Strain:   . Difficulty of Paying Living Expenses:   Food Insecurity:   . Worried About Charity fundraiser in the Last Year:   . Arboriculturist in the Last Year:   Transportation Needs:   . Film/video editor (Medical):   Marland Kitchen Lack of Transportation (Non-Medical):   Physical Activity:   . Days of Exercise per Week:   . Minutes of Exercise per Session:   Stress:   . Feeling of Stress :   Social Connections:   . Frequency of Communication with Friends and Family:   . Frequency of Social Gatherings with Friends and Family:   . Attends Religious Services:   . Active Member of Clubs or Organizations:   . Attends Archivist Meetings:   Marland Kitchen Marital Status:    Family History  Problem Relation Age of Onset  . Heart disease Father        CAD, MI x 3  . Hypertension Father   . Alcohol abuse Father   . Diabetes Father   . Diabetes Sister   . Cancer Brother        lymphoma, in remission  . Colon cancer Brother   . Heart disease Sister        CABG x 3  . Prostate cancer Neg Hx    Scheduled Meds: . acetaZOLAMIDE  500 mg Oral BID  . amLODipine  10 mg Oral Daily  . aspirin EC  81 mg Oral Daily  . atorvastatin  80 mg Oral Daily  . budesonide (PULMICORT) nebulizer solution  0.5 mg Nebulization BID  . Chlorhexidine Gluconate Cloth  6 each Topical Daily  . clopidogrel  75 mg Oral Daily  . enoxaparin (LOVENOX) injection  40 mg Subcutaneous Q24H  . fenofibrate  160 mg Oral Daily  . furosemide  60 mg Intravenous BID  . insulin aspart  0-15 Units Subcutaneous TID WC  . insulin  aspart  15 Units Subcutaneous TID WC  . ipratropium-albuterol  3 mL Nebulization Q4H  . methylPREDNISolone (SOLU-MEDROL) injection  40 mg Intravenous Q12H  . metoprolol  tartrate  25 mg Oral BID  . pantoprazole  40 mg Oral QAC breakfast  . sodium chloride flush  3 mL Intravenous Q12H  . spironolactone  25 mg Oral Daily   Continuous Infusions: . sodium chloride     PRN Meds:.sodium chloride, acetaminophen, ALPRAZolam, nicotine, ondansetron (ZOFRAN) IV, sodium chloride flush, traZODone Medications Prior to Admission:  Prior to Admission medications   Medication Sig Start Date End Date Taking? Authorizing Provider  acetaminophen (TYLENOL) 500 MG tablet Take 1,000 mg by mouth every 8 (eight) hours as needed for mild pain or headache.    [provider]  ALPRAZolam Duanne Moron) 0.25 MG tablet Take 0.25 mg by mouth 2 (two) times daily as needed for anxiety.  08/03/19   [provider]  amLODipine (NORVASC) 10 MG tablet Take 1 tablet (10 mg total) by mouth daily. 10/26/19   Loel Dubonnet, NP  aspirin EC 81 MG tablet Take 1 tablet (81 mg total) by mouth daily. 10/14/19   Gold, Wayne E, PA-C  atorvastatin (LIPITOR) 80 MG tablet Take 1 tablet (80 mg total) by mouth daily. 11/21/14   Tonia Ghent, MD  clopidogrel (PLAVIX) 75 MG tablet Take 75 mg by mouth daily.    [provider]  fenofibrate 160 MG tablet Take 1 tablet (160 mg total) by mouth daily. 01/11/15   Tonia Ghent, MD  furosemide (LASIX) 40 MG tablet Take 1 tablet (40 mg total) by mouth daily. 10/14/19   Gold, Wilder Glade, PA-C  glimepiride (AMARYL) 2 MG tablet Take one half tablet (1 mg) by mouth each morning Patient taking differently: Take 2 mg by mouth daily with breakfast.  01/11/15   Tonia Ghent, MD  glucose blood (ACCU-CHEK AVIVA PLUS) test strip Use to check blood sugar once daily or as needed.  Diagnosis:  E11.9   Non insulin-dependent. 12/14/14   Tonia Ghent, MD  Lancets (ACCU-CHEK MULTICLIX) lancets  Use as instructed to check blood sugar once daily or as needed.  Diagnosis:  E11.9   Non insulin-dependent. 12/14/14   Tonia Ghent, MD  metFORMIN (GLUCOPHAGE) 1000 MG tablet Take 1 tablet (1,000 mg total) by mouth 2 (two) times daily. 01/11/15   Tonia Ghent, MD  metoprolol tartrate (LOPRESSOR) 25 MG tablet Take 1 tablet (25 mg total) by mouth 2 (two) times daily. 01/11/15   Tonia Ghent, MD  nicotine (NICODERM CQ - DOSED IN MG/24 HOURS) 21 mg/24hr patch Place 21 mg onto the skin daily as needed (nicotine dependence).     [provider]  pantoprazole (PROTONIX) 40 MG tablet Take 40 mg by mouth daily before breakfast. 09/01/19   [provider]  spironolactone (ALDACTONE) 25 MG tablet Take 1 tablet (25 mg total) by mouth daily. 10/17/19 01/15/20  Bensimhon, Shaune Pascal, MD  traMADol (ULTRAM) 50 MG tablet Take 1 tablet (50 mg total) by mouth every 6 (six) hours as needed for moderate pain. Patient not taking: Reported on 12/07/2019 11/02/19   Gaye Pollack, MD  traZODone (DESYREL) 50 MG tablet Take 1 tablet (50 mg total) by mouth at bedtime as needed for up to 14 doses for sleep. Patient not taking: Reported on 12/07/2019 11/14/19   Gaye Pollack, MD   Allergies  Allergen Reactions  . Ramipril Cough  . Mucinex D [Pseudoephedrine-Guaifenesin Er] Other (See Comments) and Hypertension    Elevated BP, insomnia   Review of Systems  Respiratory: Positive for shortness of breath.    Physical Exam  Vitals and nursing note reviewed.  Constitutional:      Appearance: He is ill-appearing.  HENT:     Head: Normocephalic and atraumatic.  Cardiovascular:     Rate and Rhythm: Normal rate.  Pulmonary:     Effort: No tachypnea, accessory muscle usage or respiratory distress.     Comments: 4L [place] Skin:    General: Skin is warm and dry.  Neurological:     Mental Status: He is alert and oriented to person, place, and time.  Psychiatric:        Mood and Affect: Mood normal.         Speech: Speech normal.        Behavior: Behavior normal.        Cognition and Memory: Cognition normal.     Vital Signs: BP 113/71 (BP Location: Right Arm)   Pulse 69   Temp 98.5 F (36.9 C) (Oral)   Resp (!) 25   Ht _0  (1.778 m)   Wt 114.9 kg   SpO2 94%   BMI 36.35 kg/m  Pain Scale: 0-10   Pain Score: 0-No pain   SpO2: SpO2: 94 % O2 Device:SpO2: 94 % O2 Flow Rate: .O2 Flow Rate (L/min): 2.5 L/min  IO: Intake/output summary:   Intake/Output Summary (Last 24 hours) at 12/12/2019 1659 Last data filed at 12/12/2019 1600 Gross per 24 hour  Intake 220 ml  Output 3450 ml  Net -3230 ml    LBM: Last BM Date: 12/07/19 Baseline Weight: Weight: 120 kg Most recent weight: Weight: 114.9 kg     Palliative Assessment/Data: PPS 50%   Flowsheet Rows     Most Recent Value  Intake Tab  Referral Department  Critical care  Unit at Time of Referral  ICU  Palliative Care Primary Diagnosis  Cardiac  Date Notified  12/09/19  Palliative Care Type  New Palliative care  Reason for referral  Clarify Goals of Care  Date of Admission  12/07/19  Date first seen by Palliative Care  12/12/19  # of days Palliative referral response time  3 Day(s)  # of days IP prior to Palliative referral  2  Clinical Assessment  Palliative Performance Scale Score  50%  Psychosocial & Spiritual Assessment  Palliative Care Outcomes  Patient/Family meeting held?  Yes  Who was at the meeting?  patient, wife via telephone  Palliative Care Outcomes  Clarified goals of care, Provided psychosocial or spiritual support, Linked to palliative care logitudinal support, ACP counseling assistance      Time In/Out: 1040-1140, 1250-1310, 1600-1620 Time Total: 136mn Greater than 50%  of this time was spent counseling and coordinating care related to the above assessment and plan.  Signed by:  MIhor Dow DNP, FNP-C Palliative Medicine Team  Phone: [phone]: [phone]  Please contact Palliative  Medicine Team phone at [phone] questions and concerns.  For individual provider: See AShea Evans

## 2019-12-12 NOTE — Progress Notes (Signed)
Inpatient Diabetes Program Recommendations  AACE/ADA: New Consensus Statement on Inpatient Glycemic Control   Target Ranges:  Prepandial:   less than 140 mg/dL      Peak postprandial:   less than 180 mg/dL (1-2 hours)      Critically ill patients:  140 - 180 mg/dL   Results for HYMEN, ARNETT (MRN [id number]) as of 12/12/2019 08:26  Ref. Range 12/11/2019 08:42 12/11/2019 11:35 12/11/2019 15:57 12/11/2019 19:40 12/11/2019 21:49 12/12/2019 00:19 12/12/2019 07:23  Glucose-Capillary Latest Ref Range: 70 - 99 mg/dL 222 (H) 281 (H) 272 (H) 326 (H) 357 (H) 231 (H) 150 (H)   Review of Glycemic Control  Diabetes history: DM2 Outpatient Diabetes medications: Amaryl 2 mg daily, Metformin 1000 mg BID Current orders for Inpatient glycemic control: Novolog 0-15 units TID with meals, Novolog 8 units TID with meals; Solumedrol 40 mg Q12H  Inpatient Diabetes Program Recommendations:   Insulin - Meal Coverage: If steroids are continued as ordered, please consider increasing meal coverage to Novolog 15 units TID with meals if patient eats at least 50% of meals.  Thanks, Barnie Alderman, RN, MSN, CDE Diabetes Coordinator Inpatient Diabetes Program [phone] (Team Pager from 8am to 5pm)

## 2019-12-12 NOTE — Telephone Encounter (Signed)
Spoke with patients wife and she had received call from someone at the hospital about him requiring hospice. She wanted to speak with them again regarding her husband and what is going on. Advised that I would send message to the person that called earlier and have her reach out to her again. She was thankful for the call and let her know that she should hopefully call her back.

## 2019-12-12 NOTE — Progress Notes (Signed)
Progress Note  Patient Name: Joshua Roy Date of Encounter: 12/12/2019  Primary Cardiologist: Ida Rogue, MD   Subjective   The patient continues to require 4 L of nasal cannula.  He is again requesting to be discharged home.  I had a prolonged discussion with him that he is still volume overloaded and further diuresis is needed.  He denies chest pain.  Inpatient Medications    Scheduled Meds: . acetaZOLAMIDE  500 mg Oral BID  . amLODipine  10 mg Oral Daily  . aspirin EC  81 mg Oral Daily  . atorvastatin  80 mg Oral Daily  . budesonide (PULMICORT) nebulizer solution  0.5 mg Nebulization BID  . Chlorhexidine Gluconate Cloth  6 each Topical Daily  . clopidogrel  75 mg Oral Daily  . enoxaparin (LOVENOX) injection  40 mg Subcutaneous Q24H  . fenofibrate  160 mg Oral Daily  . furosemide  60 mg Intravenous BID  . insulin aspart  0-15 Units Subcutaneous TID WC  . insulin aspart  15 Units Subcutaneous TID WC  . ipratropium-albuterol  3 mL Nebulization Q4H  . methylPREDNISolone (SOLU-MEDROL) injection  40 mg Intravenous Q12H  . metoprolol tartrate  25 mg Oral BID  . pantoprazole  40 mg Oral QAC breakfast  . sodium chloride flush  3 mL Intravenous Q12H  . spironolactone  25 mg Oral Daily   Continuous Infusions: . sodium chloride     PRN Meds: sodium chloride, acetaminophen, ALPRAZolam, nicotine, ondansetron (ZOFRAN) IV, sodium chloride flush, traZODone   Vital Signs    Vitals:   12/12/19 0000 12/12/19 0200 12/12/19 0800 12/12/19 0804  BP: 139/69 114/60    Pulse: 79 68    Resp: 17     Temp:   98.1 F (36.7 C)   TempSrc:   Oral   SpO2: 99% 98%  97%  Weight:      Height:        Intake/Output Summary (Last 24 hours) at 12/12/2019 1154 Last data filed at 12/12/2019 1030 Gross per 24 hour  Intake 220 ml  Output 2875 ml  Net -2655 ml   Last 3 Weights 12/11/2019 12/10/2019 12/09/2019  Weight (lbs) 253 lb 4.9 oz 253 lb 8.5 oz 259 lb 14.8 oz  Weight (kg) 114.9 kg  115 kg 117.9 kg      Telemetry    Normal sinus rhythm- Personally Reviewed  ECG     - Personally Reviewed  Physical Exam   Constitutional:  oriented to person, place, and time. No distress.  HENT:  Head: Grossly normal Eyes:  no discharge. No scleral icterus.  Neck: Moderate JVD, no carotid bruits  Cardiovascular: Regular rate and rhythm, no murmurs appreciated Pulmonary/Chest: Bibasilar crackles bilaterally to the lower half of both lungs. Abdominal: Soft.  no distension.  no tenderness.  Musculoskeletal: Normal range of motion.  Moderate bilateral leg edema Neurological:  normal muscle tone. Coordination normal. No atrophy Skin: Skin warm and dry Psychiatric: Anxious   Labs    High Sensitivity Troponin:   Recent Labs  Lab 12/07/19 1218 12/07/19 1435  TROPONINIHS 11 12      Chemistry Recent Labs  Lab 12/07/19 1218 12/08/19 0552 12/10/19 0433 12/11/19 0513 12/12/19 0512  NA 136   < > 136 134* 135  K 4.6   < > 4.9 4.9 4.4  CL 91*   < > 87* 89* 92*  CO2 34*   < > 40* 36* 34*  GLUCOSE 114*   < > 215*  272* 180*  BUN 38*   < > 46* 42* 41*  CREATININE 1.92*   < > 1.92* 1.72* 1.72*  CALCIUM 9.1   < > 8.7* 8.8* 9.0  PROT 7.2  --   --   --   --   ALBUMIN 3.8  --   --   --   --   AST 19  --   --   --   --   ALT 22  --   --   --   --   ALKPHOS 25*  --   --   --   --   BILITOT 1.0  --   --   --   --   GFRNONAA 35*   < > 35* 39* 39*  GFRAA 40*   < > 40* 46* 46*  ANIONGAP 11   < > 9 9 9    < > = values in this interval not displayed.     Hematology Recent Labs  Lab 12/09/19 0336 12/10/19 0433 12/11/19 0513  WBC 8.3 6.2 5.6  RBC 4.39 4.56 4.55  HGB 9.4* 9.8* 9.8*  HCT 32.8* 34.1* 34.3*  MCV 74.7* 74.8* 75.4*  MCH 21.4* 21.5* 21.5*  MCHC 28.7* 28.7* 28.6*  RDW 19.7* 19.5* 19.3*  PLT 287 292 278    BNP Recent Labs  Lab 12/07/19 1218 12/08/19 0552  BNP 475.0* 411.0*     DDimer No results for input(s): DDIMER in the last 168 hours.   Radiology      No results found.  Cardiac Studies     Patient Profile     69 year old gentleman with coronary disease, recent CABG in March 2021, known carotid arterial disease prior carotid endarterectomy March 2021, postoperative atrial fibrillation, diabetes type 2, smoker, COPD, morbid obesity, chronic kidney disease presenting with worsening shortness of breath  Assessment & Plan    1.  Acute on chronic diastolic heart failure: The patient is -9 L for the admission.  However, by physical exam he is still significantly volume overloaded and he is hypoxic.  I resumed furosemide 60 mg intravenously twice daily.  Consider transfer to telemetry today.   Monitor renal function given underlying chronic kidney disease. The patient does also have underlying COPD which is likely contributing to her respiratory failure. The patient is on acetazolamide but that is not a potent diuretic   2. COPD exacerbation, active smoker Smoking cessation recommended, And her inhaler.  3.CAD with stable angina, s/p CABG in March of this year  continue aspirin, Plavix, statin  4. Paroxysmal atrial fibrillation Maintaining normal sinus rhythm On metoprolol twice daily High risk of arrhythmia given underlying respiratory distress  5. Essential hypertension On spironolactone, metoprolol, amlodipine, Lasix Blood pressure stable  6. Obstructive sleep apnea Need outpatient scheduling Auto titration needed   For questions or updates, please contact [facility] Please consult [email or web address] for contact info under        Signed, Kathlyn [place], MD  12/12/2019, 11:54 AM

## 2019-12-12 NOTE — Telephone Encounter (Signed)
Patient daughter calling to check status of advise and wife wants to speak with Dr Rockey Situ himself.

## 2019-12-12 NOTE — Progress Notes (Signed)
Received secure message from [place] requesting I call wife back to clarify our conversation earlier. Spoke with wife and daughter on speaker phone to educate on difference between palliative versus hospice philosophy and services, explaining that Joshua Roy is a candidate for palliative services with chronic conditions. Discussed in detail diagnoses, interventions, plan of care and importance of compliance with medications, follow-up appointments, diet/exercise, and smoking cessation. Wife can care for Joshua Roy at home and is hopeful that he will be compliant and show improvement. Encourage ongoing discussions regarding his goals of care and wishes. Wife is open to outpatient palliative follow-up. Answered all questions and concerns to the best of my ability. Wife appreciative of return call.   NO CHARGE  Joshua Roy, [place], FNP-C Palliative Medicine Team  Phone: [phone] Fax: [phone]

## 2019-12-12 NOTE — Progress Notes (Signed)
Pharmacy Electrolyte Monitoring Consult:  Pharmacy consulted to assist in monitoring and replacing electrolytes in this 69 y.o. male admitted on 12/07/2019 with Shortness of Breath and Leg Swelling   Labs:  Sodium (mmol/L)  Date Value  12/12/2019 135  10/25/2019 136  05/14/2013 140   Potassium (mmol/L)  Date Value  12/12/2019 4.4  05/14/2013 3.9   Magnesium (mg/dL)  Date Value  12/11/2019 2.3   Calcium (mg/dL)  Date Value  12/12/2019 9.0   Calcium, Total (mg/dL)  Date Value  05/14/2013 8.6   Albumin (g/dL)  Date Value  12/07/2019 3.8  05/13/2013 3.6    Assessment/Plan: No replacement warranted.   BMP with am labs.   Pharmacy will continue to monitor and adjust per consult.   Edwing Figley,Gustavo L 12/12/2019 8:20 PM

## 2019-12-12 NOTE — Progress Notes (Signed)
PROGRESS NOTE    Joshua Roy  NGE:[id number] DOB: 03/04/1951 DOA: 12/07/2019 PCP: [name], MD    Assessment & Plan:   Principal Problem:   Acute on chronic diastolic CHF (congestive heart failure) ([place]) Active Problems:   DISORDER, TOBACCO USE   Essential hypertension   COPD (chronic obstructive pulmonary disease) ([place])   CAD (coronary artery disease)   Morbid obesity ([place])   Acute on chronic respiratory failure with hypercapnia ([place])    Joshua Roy is a 69 y.o. Caucasian male with medical history significant for coronary artery disease status post recent four-vessel CABG, carotid disease status post recent carotid endarterectomy, history of A. 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 Patient complains of easy fatigability as well as trouble sleeping.  He also complains of dyspnea with exertion, bilateral lower extremity swelling, increased abdominal girth, orthopnea and a 14 pound weight gain since his last hospitalization.  He reports compliance with his medications but does not think he has been compliant with his diet.  He continues to smoke cigarettes.  Patient has had an increase in the dose of his diuretic from 40 mg daily to 40 mg in the morning and 20 in the afternoon by his primary care provider because of his significant weight gain. While in the office he was noted to have a pulse oximetry of 68% on room air, patient is not on home oxygen.  He was sent to the emergency room for further evaluation.   Acute on chronic diastolic dysfunction CHF Last known LVEF of 60 - 65% Patient presents for evaluation of exertional SOB, lower extremity swelling and 14lb weight gain --Cardiology consulted PLAN: --continue Diamox --resume diuresis with IV 60 mg BID today --continue  Aldactone  Acute on chronic respiratory failure with hypercapnia Likely OSA Patient was hypoxic in the field with pulse oximetry in the low 70s and also had increased work of breathing Acute worsening secondary to acute CHF exacerbation He required noninvasive mechanical ventilation to reduce his work of breathing and improve oxygenation Arterial blood gas showed uncompensated respiratory acidosis with hypercarbia --procal neg. --Continue BiPAP nightly   COPD exacerbation Since pt was not improving with diuretic, started treatment for exacerbation. 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 --SSI TID (no bed-time coverage, unless eating a meal at bedtime) --meal-time 15u TID   Paroxysmal atrial fibrillation Maintaining normal sinus rhythm Continue metoprolol twice daily   DVT prophylaxis: Lovenox SQ Code Status: DNR  Palliative consult ordered by Dr. Mortimer Fries  Family Communication:  Status is: inpatient Dispo:   The patient is from: home Anticipated d/c is to: home Anticipated d/c date is: 2-3 days Patient currently is not medically stable to d/c due to: still on 4-5L O2.  Can consider discharge when down to 2L.   Subjective and Interval History:  O2 requirement improved for the first time this  morning, down to 4-5L.  No fever, N/V/D.  Good urine output.   Objective: Vitals:   12/12/19 1200 12/12/19 1203 12/12/19 1300 12/12/19 1400   BP: 125/73     Pulse: 73  69 65  Resp:      Temp: 99.4 F (37.4 C)     TempSrc: Oral     SpO2: 91% 97% 97% 92%  Weight:      Height:        Intake/Output Summary (Last 24 hours) at 12/12/2019 1532 Last data filed at 12/12/2019 1512 Gross per 24 hour  Intake 220 ml  Output 2900 ml  Net -2680 ml   Filed Weights   12/09/19 0500 12/10/19 0344 12/11/19 0500  Weight: 117.9 kg 115 kg 114.9 kg    Examination:   Constitutional: NAD, AAOx3, up in chair HEENT: conjunctivae and lids normal, EOMI CV: No cyanosis.   RESP: Less rhonchi, on 4L, sating in high 90's GI: +BS, NTND Extremities: 1+ pitting edema up to knees in BLE, improved from prior SKIN: warm, dry and intact Neuro: II - XII grossly intact.  Sensation intact   Data Reviewed: I have personally reviewed following labs and imaging studies  CBC: Recent Labs  Lab 12/07/19 1218 12/08/19 0552 12/09/19 0336 12/10/19 0433 12/11/19 0513  WBC 9.3 7.5 8.3 6.2 5.6  NEUTROABS 7.0  --   --   --   --   HGB 10.6* 10.0* 9.4* 9.8* 9.8*  HCT 37.1* 34.5* 32.8* 34.1* 34.3*  MCV 76.3* 74.5* 74.7* 74.8* 75.4*  PLT 322 284 287 292 761   Basic Metabolic Panel: Recent Labs  Lab 12/08/19 0552 12/09/19 0336 12/10/19 0433 12/11/19 0513 12/12/19 0512  NA 138 137 136 134* 135  K 4.4 4.6 4.9 4.9 4.4  CL 92* 90* 87* 89* 92*  CO2 37* 38* 40* 36* 34*  GLUCOSE 139* 84 215* 272* 180*  BUN 39* 50* 46* 42* 41*  CREATININE 1.96* 1.91* 1.92* 1.72* 1.72*  CALCIUM 8.7* 8.5* 8.7* 8.8* 9.0  MG  --  2.2 2.2 2.3  --    GFR: Estimated Creatinine Clearance: 50.8 mL/min (A) (by C-G formula based on SCr of 1.72 mg/dL (H)). Liver Function Tests: Recent Labs  Lab 12/07/19 1218  AST 19  ALT 22  ALKPHOS 25*  BILITOT 1.0  PROT 7.2  ALBUMIN 3.8   No results for input(s): LIPASE, AMYLASE in the last 168 hours. No results for input(s): AMMONIA in the last 168 hours. Coagulation Profile: Recent Labs  Lab 12/07/19 1218  INR 1.2   Cardiac  Enzymes: No results for input(s): CKTOTAL, CKMB, CKMBINDEX, TROPONINI in the last 168 hours. BNP (last 3 results) No results for input(s): PROBNP in the last 8760 hours. HbA1C: No results for input(s): HGBA1C in the last 72 hours. CBG: Recent Labs  Lab 12/11/19 1940 12/11/19 2149 12/12/19 0019 12/12/19 0723 12/12/19 1148  GLUCAP 326* 357* 231* 150* 142*   Lipid Profile: No results for input(s): CHOL, HDL, LDLCALC, TRIG, CHOLHDL, LDLDIRECT in the last 72 hours. Thyroid Function Tests: No results for input(s): TSH, T4TOTAL, FREET4, T3FREE, THYROIDAB in the last 72 hours. Anemia Panel: No results for input(s): VITAMINB12, FOLATE, FERRITIN, TIBC, IRON, RETICCTPCT in the last 72 hours. Sepsis Labs: Recent Labs  Lab 12/09/19 0336  PROCALCITON <0.10    Recent Results (from the past 240 hour(s))  SARS Coronavirus 2 by RT PCR (hospital order, performed in [facility] hospital lab) Nasopharyngeal Nasopharyngeal Swab     Status: None  Collection Time: 12/07/19 12:18 PM   Specimen: Nasopharyngeal Swab  Result Value Ref Range Status   SARS Coronavirus 2 NEGATIVE NEGATIVE Final    Comment: (NOTE) SARS-CoV-2 target nucleic acids are NOT DETECTED. The SARS-CoV-2 RNA is generally detectable in upper and lower respiratory specimens during the acute phase of infection. The lowest concentration of SARS-CoV-2 viral copies this assay can detect is 250 copies / mL. A negative result does not preclude SARS-CoV-2 infection and should not be used as the sole basis for treatment or other patient management decisions.  A negative result may occur with improper specimen collection / handling, submission of specimen other than nasopharyngeal swab, presence of viral mutation(s) within the areas targeted by this assay, and inadequate number of viral copies (<250 copies / mL). A negative result must be combined with clinical observations, patient history, and epidemiological information. Fact Sheet  for Patients:   StrictlyIdeas.no Fact Sheet for Healthcare Providers: BankingDealers.co.za This test is not yet approved or cleared  by the [place] FDA and has been authorized for detection and/or diagnosis of SARS-CoV-2 by FDA under an Emergency Use Authorization (EUA).  This EUA will remain in effect (meaning this test can be used) for the duration of the COVID-19 declaration under Section 564(b)(1) of the Act, 21 U.S.C. section 360bbb-3(b)(1), unless the authorization is terminated or revoked sooner. Performed at [facility], [address] [place], [place] [address]       Radiology Studies: No results found.   Scheduled Meds: . acetaZOLAMIDE  500 mg Oral BID  . amLODipine  10 mg Oral Daily  . aspirin EC  81 mg Oral Daily  . atorvastatin  80 mg Oral Daily  . budesonide (PULMICORT) nebulizer solution  0.5 mg Nebulization BID  . Chlorhexidine Gluconate Cloth  6 each Topical Daily  . clopidogrel  75 mg Oral Daily  . enoxaparin (LOVENOX) injection  40 mg Subcutaneous Q24H  . fenofibrate  160 mg Oral Daily  . furosemide  60 mg Intravenous BID  . insulin aspart  0-15 Units Subcutaneous TID WC  . insulin aspart  15 Units Subcutaneous TID WC  . ipratropium-albuterol  3 mL Nebulization Q4H  . methylPREDNISolone (SOLU-MEDROL) injection  40 mg Intravenous Q12H  . metoprolol tartrate  25 mg Oral BID  . pantoprazole  40 mg Oral QAC breakfast  . sodium chloride flush  3 mL Intravenous Q12H  . spironolactone  25 mg Oral Daily   Continuous Infusions: . sodium chloride       LOS: 5 days     Enzo Bi, MD [facility] If 7PM-7AM, please contact night-coverage 12/12/2019, 3:32 PM

## 2019-12-13 LAB — GLUCOSE, CAPILLARY
Glucose-Capillary: 300 mg/dL — ABNORMAL HIGH (ref 70–99)
Glucose-Capillary: 250 mg/dL — ABNORMAL HIGH (ref 70–99)
Glucose-Capillary: 320 mg/dL — ABNORMAL HIGH (ref 70–99)
Glucose-Capillary: 249 mg/dL — ABNORMAL HIGH (ref 70–99)

## 2019-12-13 LAB — BASIC METABOLIC PANEL
Anion gap: 8 (ref 5–15)
BUN: 45 mg/dL — ABNORMAL HIGH (ref 8–23)
CO2: 36 mmol/L — ABNORMAL HIGH (ref 22–32)
Calcium: 9.2 mg/dL (ref 8.9–10.3)
Chloride: 91 mmol/L — ABNORMAL LOW (ref 98–111)
Creatinine, Ser: 1.79 mg/dL — ABNORMAL HIGH (ref 0.61–1.24)
GFR calc Af Amer: 44 mL/min — ABNORMAL LOW (ref >60)
GFR calc non Af Amer: 38 mL/min — ABNORMAL LOW (ref >60)
Glucose, Bld: 307 mg/dL — ABNORMAL HIGH (ref 70–99)
Potassium: 4.6 mmol/L (ref 3.5–5.1)
Sodium: 135 mmol/L (ref 135–145)

## 2019-12-13 LAB — CBC
HCT: 38.1 % — ABNORMAL LOW (ref 39.0–52.0)
Hemoglobin: 10.8 g/dL — ABNORMAL LOW (ref 13.0–17.0)
MCH: 21.5 pg — ABNORMAL LOW (ref 26.0–34.0)
MCHC: 28.3 g/dL — ABNORMAL LOW (ref 30.0–36.0)
MCV: 75.7 fL — ABNORMAL LOW (ref 80.0–100.0)
Platelets: 282 K/uL (ref 150–400)
RBC: 5.03 MIL/uL (ref 4.22–5.81)
RDW: 19.2 % — ABNORMAL HIGH (ref 11.5–15.5)
WBC: 8.3 K/uL (ref 4.0–10.5)
nRBC: 0 % (ref 0.0–0.2)

## 2019-12-13 LAB — MAGNESIUM: Magnesium: 2.3 mg/dL (ref 1.7–2.4)

## 2019-12-13 MED ORDER — IPRATROPIUM-ALBUTEROL 0.5-2.5 (3) MG/3ML IN SOLN
3.0000 mL | Freq: Four times a day (QID) | RESPIRATORY_TRACT | Status: DC
  Administered 2019-12-14 (×2): 3 mL via RESPIRATORY_TRACT
  Filled 2019-12-13 (×2): qty 3

## 2019-12-13 MED ORDER — INSULIN ASPART 100 UNIT/ML ~~LOC~~ SOLN
0.0000 [IU] | Freq: Every day | SUBCUTANEOUS | Status: DC
  Administered 2019-12-13: 22:00:00 2 [IU] via SUBCUTANEOUS
  Filled 2019-12-13: qty 1

## 2019-12-13 MED ORDER — PREDNISONE 20 MG PO TABS
40.0000 mg | ORAL_TABLET | Freq: Every day | ORAL | Status: DC
  Administered 2019-12-14: 09:00:00 40 mg via ORAL
  Filled 2019-12-13: qty 2

## 2019-12-13 MED ORDER — METHYLPREDNISOLONE SODIUM SUCC 40 MG IJ SOLR
40.0000 mg | Freq: Every day | INTRAMUSCULAR | Status: DC
  Administered 2019-12-13: 40 mg via INTRAVENOUS
  Filled 2019-12-13: qty 1

## 2019-12-13 MED ORDER — INSULIN ASPART 100 UNIT/ML ~~LOC~~ SOLN
0.0000 [IU] | Freq: Three times a day (TID) | SUBCUTANEOUS | Status: DC
  Administered 2019-12-13: 18:00:00 11 [IU] via SUBCUTANEOUS
  Administered 2019-12-13: 5 [IU] via SUBCUTANEOUS
  Administered 2019-12-14: 12:00:00 3 [IU] via SUBCUTANEOUS
  Filled 2019-12-13 (×3): qty 1

## 2019-12-13 NOTE — TOC Progression Note (Signed)
Transition of Care [facility]) - Progression Note    Patient Details  Name: Joshua Roy MRN: [id number] Date of Birth: July 15, 1951  Transition of Care [facility]) CM/SW Contact  Beverly Sessions, RN Phone Number: 12/13/2019, 2:04 PM  Clinical Narrative:     Outpatient palliative referral made to [facility] with [facility]   Expected Discharge Plan: Home/Self Care Barriers to Discharge: Continued Medical Work up  Expected Discharge Plan and Services Expected Discharge Plan: Home/Self Care       Living arrangements for the past 2 months: Single Family Home                                       Social Determinants of Health (SDOH) Interventions    Readmission Risk Interventions Readmission Risk Prevention Plan 12/08/2019  Transportation Screening Complete  PCP or Specialist Appt within 3-5 Days Complete  HRI or Home Care Consult Complete  Medication Review (RN Care Manager) Complete  Some recent data might be hidden

## 2019-12-13 NOTE — Progress Notes (Addendum)
PROGRESS NOTE    Joshua Roy  CXK:[id number] DOB: 12-11-1950 DOA: 12/07/2019 PCP: Joshua Athens, MD    Assessment & Plan:   Principal Problem:   Acute on chronic heart failure with preserved ejection fraction (HFpEF) ([facility]) Active Problems:   DISORDER, TOBACCO USE   Essential hypertension   COPD (chronic obstructive pulmonary disease) ([facility])   CAD (coronary artery disease)   Morbid obesity ([place])   Goals of care, counseling/discussion   Acute on chronic respiratory failure with hypercapnia [facility])   Palliative care by specialist    Joshua Roy is a 69 y.o. Caucasian male with medical history significant for coronary artery disease status post recent four-vessel CABG, carotid disease status post recent carotid endarterectomy, history of A. 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 Patient complains of easy fatigability as well as trouble sleeping.  He also complains of dyspnea with exertion, bilateral lower extremity swelling, increased abdominal girth, orthopnea and a 14 pound weight gain since his last hospitalization.  He reports compliance with his medications but does not think he has been compliant with his diet.  He continues to smoke cigarettes.  Patient has had an increase in the dose of his diuretic from 40 mg daily to 40 mg in the morning and 20 in the afternoon by his primary care provider because of his significant weight gain. While in the office he was noted to have a pulse oximetry of 68% on room air, patient is not on home oxygen.  He was sent to the emergency room for further evaluation.   Acute on chronic diastolic dysfunction CHF Last known LVEF of 60 - 65% Patient presents for evaluation of exertional SOB, lower extremity swelling and 14lb weight gain --Cardiology  consulted PLAN: --continue Diamox --continue diuresis with IV 60 mg BID today --continue Aldactone  Acute on chronic respiratory failure with hypercapnia Likely OSA Patient was hypoxic in the field with pulse oximetry in the low 70s and also had increased work of breathing Acute worsening secondary to acute CHF exacerbation He required noninvasive mechanical ventilation to reduce his work of breathing and improve oxygenation Arterial blood gas showed uncompensated respiratory acidosis with hypercarbia --procal neg. --Continue BiPAP nightly while inpatient --Pt has plans for outpatient sleep study, and was advised to hold until he is back to baseline before getting evaluated.  COPD exacerbation Since pt was not improving with diuretic, started treatment for exacerbation. --taper solumedrol to 40 daily, and then to prednisone 40 mg tomorrow. 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 --SSI TID (no bed-time coverage, unless eating a meal at bedtime) --meal-time 15u TID   Paroxysmal atrial fibrillation Maintaining normal sinus rhythm Continue metoprolol twice daily   DVT prophylaxis: Lovenox SQ Code Status: DNR  Palliative consult ordered by Dr. Mortimer Fries  Family Communication:  Status is: inpatient Dispo:   The patient is from:  home Anticipated d/c is to: home Anticipated d/c date is: tomorrow Patient currently is not medically stable to d/c  due to: still need IV lasix, per cards.  Plan to transition to oral diuretic tomorrow, as pt said he will leave the hospital tomorrow.   Subjective and Interval History:  Pt admitted to feeling better.  O2 requirement had gone down to ~2L.  Swelling improved.  No fever, chest pain, abdominal pain, N/V/D, dysuria.   Objective: Vitals:   12/13/19 1100 12/13/19 1200 12/13/19 1408 12/13/19 1925  BP:  (!) 114/44 (!) 149/68   Pulse:  70 75   Resp: 19 11 20    Temp:  98.1 F (36.7 C) 98 F (36.7 C)   TempSrc:  Oral Oral   SpO2:  92% 95% 94%  Weight:      Height:        Intake/Output Summary (Last 24 hours) at 12/13/2019 2031 Last data filed at 12/13/2019 1853 Gross per 24 hour  Intake --  Output 2650 ml  Net -2650 ml   Filed Weights   12/09/19 0500 12/10/19 0344 12/11/19 0500  Weight: 117.9 kg 115 kg 114.9 kg    Examination:   Constitutional: NAD, AAOx3, up in chair HEENT: conjunctivae and lids normal, EOMI CV: No cyanosis.   RESP: less rhonchi, crackles over posterior left mid-low lung fields, on 2L GI: +BS, NTND Extremities: 1-2+ pitting edema up to knees in BLE, improved from prior SKIN: warm, dry and intact Neuro: II - XII grossly intact.  Sensation intact   Data Reviewed: I have personally reviewed following labs and imaging studies  CBC: Recent Labs  Lab 12/07/19 1218 12/07/19 1218 12/08/19 0552 12/09/19 0336 12/10/19 0433 12/11/19 0513 12/13/19 0407  WBC 9.3   < > 7.5 8.3 6.2 5.6 8.3  NEUTROABS 7.0  --   --   --   --   --   --   HGB 10.6*   < > 10.0* 9.4* 9.8* 9.8* 10.8*  HCT 37.1*   < > 34.5* 32.8* 34.1* 34.3* 38.1*  MCV 76.3*   < > 74.5* 74.7* 74.8* 75.4* 75.7*  PLT 322   < > 284 287 292 278 282   < > = values in this interval not displayed.   Basic Metabolic Panel: Recent Labs  Lab 12/09/19 0336 12/10/19 0433 12/11/19 0513 12/12/19 0512 12/13/19 0407  NA 137 136 134* 135 135  K 4.6 4.9 4.9 4.4 4.6  CL 90* 87* 89* 92* 91*  CO2 38* 40* 36*  34* 36*  GLUCOSE 84 215* 272* 180* 307*  BUN 50* 46* 42* 41* 45*  CREATININE 1.91* 1.92* 1.72* 1.72* 1.79*  CALCIUM 8.5* 8.7* 8.8* 9.0 9.2  MG 2.2 2.2 2.3  --  2.3   GFR: Estimated Creatinine Clearance: 48.8 mL/min (A) (by C-G formula based on SCr of 1.79 mg/dL (H)). Liver Function Tests: Recent Labs  Lab 12/07/19 1218  AST 19  ALT 22  ALKPHOS 25*  BILITOT 1.0  PROT 7.2  ALBUMIN 3.8   No results for input(s): LIPASE, AMYLASE in the last 168 hours. No results for input(s): AMMONIA in the last 168 hours. Coagulation Profile: Recent Labs  Lab 12/07/19 1218  INR 1.2   Cardiac Enzymes: No results for input(s): CKTOTAL, CKMB, CKMBINDEX, TROPONINI in the last 168 hours. BNP (last 3 results) No results for input(s): PROBNP in the last 8760 hours. HbA1C: No results for input(s): HGBA1C in the last 72 hours. CBG: Recent Labs  Lab  12/12/19 1635 12/12/19 2220 12/13/19 0757 12/13/19 1150 12/13/19 1711  GLUCAP 163* 277* 300* 250* 320*   Lipid Profile: No results for input(s): CHOL, HDL, LDLCALC, TRIG, CHOLHDL, LDLDIRECT in the last 72 hours. Thyroid Function Tests: No results for input(s): TSH, T4TOTAL, FREET4, T3FREE, THYROIDAB in the last 72 hours. Anemia Panel: No results for input(s): VITAMINB12, FOLATE, FERRITIN, TIBC, IRON, RETICCTPCT in the last 72 hours. Sepsis Labs: Recent Labs  Lab 12/09/19 0336  PROCALCITON <0.10    Recent Results (from the past 240 hour(s))  SARS Coronavirus 2 by RT PCR (hospital order, performed in [facility] hospital lab) Nasopharyngeal Nasopharyngeal Swab     Status: None   Collection Time: 12/07/19 12:18 PM   Specimen: Nasopharyngeal Swab  Result Value Ref Range Status   SARS Coronavirus 2 NEGATIVE NEGATIVE Final    Comment: (NOTE) SARS-CoV-2 target nucleic acids are NOT DETECTED. The SARS-CoV-2 RNA is generally detectable in upper and lower respiratory specimens during the acute phase of infection. The lowest concentration of  SARS-CoV-2 viral copies this assay can detect is 250 copies / mL. A negative result does not preclude SARS-CoV-2 infection and should not be used as the sole basis for treatment or other patient management decisions.  A negative result may occur with improper specimen collection / handling, submission of specimen other than nasopharyngeal swab, presence of viral mutation(s) within the areas targeted by this assay, and inadequate number of viral copies (<250 copies / mL). A negative result must be combined with clinical observations, patient history, and epidemiological information. Fact Sheet for Patients:   StrictlyIdeas.no Fact Sheet for Healthcare Providers: BankingDealers.co.za This test is not yet approved or cleared  by the [place] FDA and has been authorized for detection and/or diagnosis of SARS-CoV-2 by FDA under an Emergency Use Authorization (EUA).  This EUA will remain in effect (meaning this test can be used) for the duration of the COVID-19 declaration under Section 564(b)(1) of the Act, 21 U.S.C. section 360bbb-3(b)(1), unless the authorization is terminated or revoked sooner. Performed at [facility], [address] [place], [place] [address]       Radiology Studies: No results found.   Scheduled Meds: . amLODipine  10 mg Oral Daily  . aspirin EC  81 mg Oral Daily  . atorvastatin  80 mg Oral Daily  . budesonide (PULMICORT) nebulizer solution  0.5 mg Nebulization BID  . Chlorhexidine Gluconate Cloth  6 each Topical Daily  . clopidogrel  75 mg Oral Daily  . enoxaparin (LOVENOX) injection  40 mg Subcutaneous Q24H  . fenofibrate  160 mg Oral Daily  . furosemide  60 mg Intravenous BID  . insulin aspart  0-15 Units Subcutaneous TID WC  . insulin aspart  0-5 Units Subcutaneous QHS  . insulin aspart  15 Units Subcutaneous TID WC  . ipratropium-albuterol  3 mL Nebulization Q4H  . methylPREDNISolone  (SOLU-MEDROL) injection  40 mg Intravenous Daily  . metoprolol tartrate  25 mg Oral BID  . pantoprazole  40 mg Oral QAC breakfast  . sodium chloride flush  3 mL Intravenous Q12H  . spironolactone  25 mg Oral Daily   Continuous Infusions: . sodium chloride       LOS: 6 days     Enzo Bi, MD [facility] If 7PM-7AM, please contact night-coverage 12/13/2019, 8:31 PM

## 2019-12-13 NOTE — Progress Notes (Signed)
[facility] Room ICU 09 [facility]) Hospital Liaison RN note  This patient has been referred for community based palliative care to be followed by [employer].  Will follow for disposition.  Please call with any questions or concerns.  Thank you. Margaretmary Eddy, BSN, RN [facility] Liaison [phone]

## 2019-12-13 NOTE — Progress Notes (Signed)
Progress Note  Patient Name: Joshua Roy Date of Encounter: 12/13/2019  Primary Cardiologist: Ida Rogue, MD   Subjective   Patient feels as if his breathing and volume status is improving. No report of CP, palpitations, or racing HR.   He is eager to have a sleep study performed for sleep apnea and would like more information about how soon this can be arranged for him.  Long discussion regarding his most recent outpatient and hospital course. He is agreeable with the current treatment plan and did not request discharge home.   Inpatient Medications    Scheduled Meds: . acetaZOLAMIDE  500 mg Oral BID  . amLODipine  10 mg Oral Daily  . aspirin EC  81 mg Oral Daily  . atorvastatin  80 mg Oral Daily  . budesonide (PULMICORT) nebulizer solution  0.5 mg Nebulization BID  . Chlorhexidine Gluconate Cloth  6 each Topical Daily  . clopidogrel  75 mg Oral Daily  . enoxaparin (LOVENOX) injection  40 mg Subcutaneous Q24H  . fenofibrate  160 mg Oral Daily  . furosemide  60 mg Intravenous BID  . insulin aspart  0-15 Units Subcutaneous TID WC  . insulin aspart  15 Units Subcutaneous TID WC  . ipratropium-albuterol  3 mL Nebulization Q4H  . methylPREDNISolone (SOLU-MEDROL) injection  40 mg Intravenous Daily  . metoprolol tartrate  25 mg Oral BID  . pantoprazole  40 mg Oral QAC breakfast  . sodium chloride flush  3 mL Intravenous Q12H  . spironolactone  25 mg Oral Daily   Continuous Infusions: . sodium chloride     PRN Meds: sodium chloride, acetaminophen, ALPRAZolam, nicotine, ondansetron (ZOFRAN) IV, sodium chloride flush, traZODone   Vital Signs    Vitals:   12/13/19 0400 12/13/19 0600 12/13/19 0800 12/13/19 0900  BP: 124/69 125/70 110/62   Pulse: 65 70 69 71  Resp: 19 20 19  (!) 25  Temp:   97.9 F (36.6 C)   TempSrc:   Oral   SpO2: 97% 96% 93% 94%  Weight:      Height:        Intake/Output Summary (Last 24 hours) at 12/13/2019 0937 Last data filed at  12/13/2019 0800 Gross per 24 hour  Intake 120 ml  Output 4150 ml  Net -4030 ml   Last 3 Weights 12/11/2019 12/10/2019 12/09/2019  Weight (lbs) 253 lb 4.9 oz 253 lb 8.5 oz 259 lb 14.8 oz  Weight (kg) 114.9 kg 115 kg 117.9 kg      Telemetry    NSR, 70s - Personally Reviewed  ECG    No new tracings - Personally Reviewed  Physical Exam   GEN: No acute distress.  Lying in bed. Neck: JVP ~10cm Cardiac: RRR, no murmurs, rubs, or gallops.  Respiratory: Bibasilar crackles and bilateral wheezing appreciated. GI: Soft, nontender, non-distended  MS: mild to moderate bilateral edema; No deformity. Neuro:  Nonfocal  Psych: Normal affect   Labs    High Sensitivity Troponin:   Recent Labs  Lab 12/07/19 1218 12/07/19 1435  TROPONINIHS 11 12      Chemistry Recent Labs  Lab 12/07/19 1218 12/08/19 0552 12/11/19 0513 12/12/19 0512 12/13/19 0407  NA 136   < > 134* 135 135  K 4.6   < > 4.9 4.4 4.6  CL 91*   < > 89* 92* 91*  CO2 34*   < > 36* 34* 36*  GLUCOSE 114*   < > 272* 180* 307*  BUN 38*   < >  42* 41* 45*  CREATININE 1.92*   < > 1.72* 1.72* 1.79*  CALCIUM 9.1   < > 8.8* 9.0 9.2  PROT 7.2  --   --   --   --   ALBUMIN 3.8  --   --   --   --   AST 19  --   --   --   --   ALT 22  --   --   --   --   ALKPHOS 25*  --   --   --   --   BILITOT 1.0  --   --   --   --   GFRNONAA 35*   < > 39* 39* 38*  GFRAA 40*   < > 46* 46* 44*  ANIONGAP 11   < > 9 9 8    < > = values in this interval not displayed.     Hematology Recent Labs  Lab 12/10/19 0433 12/11/19 0513 12/13/19 0407  WBC 6.2 5.6 8.3  RBC 4.56 4.55 5.03  HGB 9.8* 9.8* 10.8*  HCT 34.1* 34.3* 38.1*  MCV 74.8* 75.4* 75.7*  MCH 21.5* 21.5* 21.5*  MCHC 28.7* 28.6* 28.3*  RDW 19.5* 19.3* 19.2*  PLT 292 278 282    BNP Recent Labs  Lab 12/07/19 1218 12/08/19 0552  BNP 475.0* 411.0*     DDimer No results for input(s): DDIMER in the last 168 hours.   Radiology    No results found.  Cardiac Studies    Echo 12/08/19 1. Left ventricular ejection fraction, by estimation, is 55 to 60%. The  left ventricle has normal function. The left ventricle has no regional  wall motion abnormalities. There is mild left ventricular hypertrophy.  Left ventricular diastolic parameters  are indeterminate.  2. Right ventricular systolic function is normal. The right ventricular  size is normal. Tricuspid regurgitation signal is inadequate for assessing  PA pressure.  3. Left atrial size was moderately dilated.  4. Challenging image quality   10/07/2019 CABG Cyndia Bent, Fernande Boyden, MD) R carotid endarterectomy Donnetta Hutching, Arvilla Meres, MD)  10/07/19 TEE Left Ventricle: The left ventricle has normal systolic function, with an  ejection fraction of 60-65%. The cavity size was normal. There is severely  increased left ventricular wall thickness.  Right Ventricle: The right ventricle has normal systolic function. The  cavity was normal. There is increased right ventricular wall thickness.  Left Atrium: Left atrial size was not assessed. The left atrial appendage  is well visualized and there is no evidence of thrombus present.  Right Atrium: Right atrial size was not assessed. Right atrial pressure is  estimated at 10 mmHg.  Interatrial Septum: No atrial level shunt detected by color flow Doppler.  Pericardium: There is no evidence of pericardial effusion.  Mitral Valve: The mitral valve is normal in structure. No thickening of  the mitral valve leaflet. No calcification of the mitral valve leaflet.  Mitral valve regurgitation is not visualized by color flow Doppler.  Tricuspid Valve: The tricuspid valve was normal in structure. Tricuspid  valve regurgitation was not visualized by color flow Doppler.  Aortic Valve: The aortic valve is normal in structure. There is mild  thickening of the aortic valve and There is mild calcification of the  aortic valve Aortic valve regurgitation was not visualized by color flow   Doppler. There is no evidence of aortic  valve stenosis.  Pulmonic Valve: The pulmonic valve was normal in structure.  Pulmonic valve regurgitation is not visualized  by color flow Doppler.   10/05/2019 US Carotids Right Carotid: Velocities in the right ICA are consistent with a 80-99%         stenosis.  Left Carotid: Velocities in the left ICA are consistent with a 1-39%  stenosis.  Vertebrals: Bilateral vertebral arteries demonstrate antegrade flow.  Subclavians: Normal flow hemodynamics were seen in bilateral subclavian        arteries.  Right Upper Extremity: Doppler waveforms remain within normal limits with  right radial compression. Doppler waveforms remain within normal limits  with right ulnar compression.  Left Upper Extremity: Doppler waveforms decrease <50% w left radial  compression. Doppler waveforms remain within normal limits with left ulnar  compression.    [place] 09/2019 Normal sinus rhythm avg HR of 77 bpm 4 Ventricular Tachycardia runs occurred, the run with the fastest interval lasting 7 beats with a max rate of 190 bpm,  the longest lasting 6 beats with an avg rate of 146 bpm.  189 Supraventricular Tachycardia runs occurred, the run with the fastest interval lasting 6 beats with a max rate of 174 bpm, the longest lasting 15 beats with an avg rate of 107 bpm. Idioventricular Rhythm was present. Isolated SVEs were occasional (4.9%, 75441),SVE Couplets were rare (<1.0%, 5401), and SVE Triplets were rare (<1.0%, 862). Isolated VEs were rare (<1.0%, 14128), VE Couplets were rare (<1.0%, 430), and VE Triplets were rare (<1.0%, 3). Ventricular Bigeminy and Trigeminy were present. No patient triggered events noted.  09/08/2019 Cardiac cath  Mid LM to Dist LM lesion is 70% stenosed.  Ost LM lesion is 40% stenosed.  Prox LAD to Mid LAD lesion is 70% stenosed.  Mid Cx lesion is 80% stenosed.  Ost RCA to Prox RCA lesion is 95% stenosed.  Mid RCA to  Dist RCA lesion is 100% stenosed.  RPAV lesion is 90% stenosed.  Hemodynamic findings consistent with moderate pulmonary hypertension.  LV end diastolic pressure is normal.  NM Study 08/03/2016 Exercise myocardial perfusion imaging study predominantly fixed inferior wall perfusion defect consistent with old MI, with mild peri-infarct ischemia.  Inferior wall hypokinesis, EF estimated at 38% No EKG changes concerning for ischemia at peak stress or in recovery. T wave abnormality anterolateral leads at rest Hypertensive with exercise Adequate exercise tolerance, 5 min 46 sec, 7 METS Target heart rate achieved Moderate risk scan given old MI, depressed EF Images consistent with known RCA occlusion  Patient Profile     69 y.o. 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  Assessment & Plan    Acute on chronic diastolic heart failure -Reports improved shortness of breath and volume status.  On exam, he still appears somewhat volume overloaded.  He remains on nasal cannula oxygen and had just received a breathing treatment this morning.Consider that underlying COPD, current tobacco use, and undiagnosed OSA is likely contributing to his current respiratory status as well.  --Repeat echo as above with EF nl.  --Continue to monitor I's/O's, daily weights.  He is net -13 L for the admission and almost net -4L yesterday.  Wt 115 kg  114.9 kg. --Daily BMET. Renal function stable with Cr 1.79 and BUN 45.  --Continue IV diuresis with furosemide 60mg  BID and titrate as needed. --He has been continued on acetazolamide. With continued rise  in CO2, consider transition off of this diuretic.  --Agree with previous suggestion to transition  patient to telemetry today.  --At discharge, ensure he is on an oral diuretic and has follow-up scheduled with the office.  --Schedule follow-up with the Heart Failure clinic and [facility] (Dr. Rockey Situ or APP) at discharge.   CAD s/p CABG 09/2019 --No report of angina this AM. S/p CABG 09/2019.  --Continue current medical management with ASA, Plavix, and statin.  --Cardiac rehab as an outpatient strongly recommended, if not already scheduled.  --No plan for ischemic evaluation at this time. Schedule follow-up in the office.  Postoperative Paroxysmal Atrial Fibrillation --Reportedly experienced brief PAF s/p 09/2019 CABG as above.  --Maintaining NSR this admission.  --Continue PTA lopressor 25mg  BID. PTA amiodarone 400mg  daily was not continued at admission. He has not been on long term [place]. --CHA2DS2VASc score of at least 5 (CHF, HTN, agex1, DM2, vascular).   --As previously noted, he is at high risk for arrhythmia given his underlying respiratory distress and steroids administered currently. Wean steriods as tolerated.   Essential hypertension --Most recent blood pressure stable and relatively well controlled. Continue spironolactone 25mg , lopressor 25mg  BID, amlodipine 10mg , and IV Lasix. He will need discharged with oral diuretic.    Carotid artery dz s/p R sided CEA 09/2019 --Continue ASA and statin.   OSA --As previously noted, he will need follow-up scheduled with pulmonology for testing and fitting for CPAP.  Arrangements per critical care/internal medicine.  Tobacco use COPD exacerbation --Remains on nicotine patch. Smoking cessation advised. Continue inhaler.  HLD --LDL 39 from 09/2019. Continue statin.  DM2 --A1C 7.4. Strict glycemic control recommended. SSI. Per IM.   CKD III --As above, renal function stable. Daily BMET.   For questions or updates, please contact [place] Please consult [email or web address] for contact info under        Signed, Arvil Chaco, PA-C  12/13/2019, 9:37 AM

## 2019-12-13 NOTE — Progress Notes (Signed)
Inpatient Diabetes Program Recommendations  AACE/ADA: New Consensus Statement on Inpatient Glycemic Control   Target Ranges:  Prepandial:   less than 140 mg/dL      Peak postprandial:   less than 180 mg/dL (1-2 hours)      Critically ill patients:  140 - 180 mg/dL  Results for Joshua Roy, Joshua Roy (MRN [id number]) as of 12/13/2019 07:35  Ref. Range 12/13/2019 04:07  Glucose Latest Ref Range: 70 - 99 mg/dL 307 (H)   Results for Joshua Roy, Joshua Roy (MRN [id number]) as of 12/13/2019 07:35  Ref. Range 12/12/2019 07:23 12/12/2019 11:48 12/12/2019 16:35 12/12/2019 22:20  Glucose-Capillary Latest Ref Range: 70 - 99 mg/dL 150 (H) 142 (H) 163 (H) 277 (H)   Review of Glycemic Control  Diabetes history: DM2 Outpatient Diabetes medications: Amaryl 2 mg daily, Metformin 1000 mg BID Current orders for Inpatient glycemic control: Novolog 0-15 units TID with meals, Novolog 15 units TID with meals; Solumedrol 40 mg Q12H  Inpatient Diabetes Program Recommendations:   Insulin-Correction: Please consider ordering Novolog 0-5 units QHS for bedtime correction. If bedtime glucose is corrected, it will help keep fasting from being so elevated.  Thanks, Barnie Alderman, RN, MSN, CDE Diabetes Coordinator Inpatient Diabetes Program [phone] (Team Pager from 8am to 5pm)

## 2019-12-14 LAB — BASIC METABOLIC PANEL
Anion gap: 10 (ref 5–15)
BUN: 51 mg/dL — ABNORMAL HIGH (ref 8–23)
CO2: 36 mmol/L — ABNORMAL HIGH (ref 22–32)
Calcium: 9.2 mg/dL (ref 8.9–10.3)
Chloride: 91 mmol/L — ABNORMAL LOW (ref 98–111)
Creatinine, Ser: 1.74 mg/dL — ABNORMAL HIGH (ref 0.61–1.24)
GFR calc Af Amer: 45 mL/min — ABNORMAL LOW (ref >60)
GFR calc non Af Amer: 39 mL/min — ABNORMAL LOW (ref >60)
Glucose, Bld: 155 mg/dL — ABNORMAL HIGH (ref 70–99)
Potassium: 3.9 mmol/L (ref 3.5–5.1)
Sodium: 137 mmol/L (ref 135–145)

## 2019-12-14 LAB — CBC
HCT: 38.3 % — ABNORMAL LOW (ref 39.0–52.0)
Hemoglobin: 11.3 g/dL — ABNORMAL LOW (ref 13.0–17.0)
MCH: 21.8 pg — ABNORMAL LOW (ref 26.0–34.0)
MCHC: 29.5 g/dL — ABNORMAL LOW (ref 30.0–36.0)
MCV: 73.9 fL — ABNORMAL LOW (ref 80.0–100.0)
Platelets: 293 10*3/uL (ref 150–400)
RBC: 5.18 MIL/uL (ref 4.22–5.81)
RDW: 18.8 % — ABNORMAL HIGH (ref 11.5–15.5)
WBC: 8.6 10*3/uL (ref 4.0–10.5)
nRBC: 0 % (ref 0.0–0.2)

## 2019-12-14 LAB — BRAIN NATRIURETIC PEPTIDE: B Natriuretic Peptide: 292.6 pg/mL — ABNORMAL HIGH (ref 0.0–100.0)

## 2019-12-14 LAB — GLUCOSE, CAPILLARY
Glucose-Capillary: 118 mg/dL — ABNORMAL HIGH (ref 70–99)
Glucose-Capillary: 154 mg/dL — ABNORMAL HIGH (ref 70–99)

## 2019-12-14 LAB — MAGNESIUM: Magnesium: 2.1 mg/dL (ref 1.7–2.4)

## 2019-12-14 MED ORDER — FUROSEMIDE 40 MG PO TABS: 40.0000 mg | ORAL_TABLET | Freq: Two times a day (BID) | ORAL | Status: DC

## 2019-12-14 MED ORDER — INSULIN ASPART 100 UNIT/ML ~~LOC~~ SOLN
10.0000 [IU] | Freq: Once | SUBCUTANEOUS | Status: AC
  Administered 2019-12-14: 09:00:00 10 [IU] via SUBCUTANEOUS

## 2019-12-14 MED ORDER — FUROSEMIDE 40 MG PO TABS: 40.0000 mg | ORAL_TABLET | Freq: Two times a day (BID) | ORAL | 1 refills | Status: DC

## 2019-12-14 MED ORDER — FENOFIBRATE 160 MG PO TABS: 160.0000 mg | ORAL_TABLET | Freq: Every day | ORAL | 1 refills | Status: DC

## 2019-12-14 NOTE — Progress Notes (Signed)
Pharmacy Electrolyte Monitoring Consult:  Pharmacy consulted to assist in monitoring and replacing electrolytes in this 69 y.o. male admitted on 12/07/2019 with Shortness of Breath and Leg Swelling   Labs:  Sodium (mmol/L)  Date Value  12/14/2019 137  10/25/2019 136  05/14/2013 140   Potassium (mmol/L)  Date Value  12/14/2019 3.9  05/14/2013 3.9   Magnesium (mg/dL)  Date Value  12/14/2019 2.1   Calcium (mg/dL)  Date Value  12/14/2019 9.2   Calcium, Total (mg/dL)  Date Value  05/14/2013 8.6   Albumin (g/dL)  Date Value  12/07/2019 3.8  05/13/2013 3.6    Assessment/Plan: No replacement warranted x 2 days    Pharmacy will sign off at this time.  Lu Duffel, PharmD, BCPS Clinical Pharmacist 12/14/2019 7:21 AM

## 2019-12-14 NOTE — Progress Notes (Signed)
Progress Note  Patient Name: Joshua Roy Date of Encounter: 12/14/2019  Primary Cardiologist: Ida Rogue, MD   Subjective   Denies chest pain, racing heart rate, palpitations, or breathing issues.    Continues to note improved volume status.  Currently on 2 L nasal cannula oxygen, which she reports is his baseline.    He reports he is ready to go home.   He reportedly received a number to call for sleep study / CPAP evaluation and workup.   Inpatient Medications    Scheduled Meds: . amLODipine  10 mg Oral Daily  . aspirin EC  81 mg Oral Daily  . atorvastatin  80 mg Oral Daily  . budesonide (PULMICORT) nebulizer solution  0.5 mg Nebulization BID  . Chlorhexidine Gluconate Cloth  6 each Topical Daily  . clopidogrel  75 mg Oral Daily  . enoxaparin (LOVENOX) injection  40 mg Subcutaneous Q24H  . fenofibrate  160 mg Oral Daily  . furosemide  60 mg Intravenous BID  . insulin aspart  0-15 Units Subcutaneous TID WC  . insulin aspart  0-5 Units Subcutaneous QHS  . insulin aspart  15 Units Subcutaneous TID WC  . ipratropium-albuterol  3 mL Nebulization QID  . metoprolol tartrate  25 mg Oral BID  . pantoprazole  40 mg Oral QAC breakfast  . predniSONE  40 mg Oral Q breakfast  . sodium chloride flush  3 mL Intravenous Q12H  . spironolactone  25 mg Oral Daily   Continuous Infusions: . sodium chloride     PRN Meds: sodium chloride, acetaminophen, ALPRAZolam, nicotine, ondansetron (ZOFRAN) IV, sodium chloride flush, traZODone   Vital Signs    Vitals:   12/14/19 0400 12/14/19 0500 12/14/19 0733 12/14/19 0738  BP: (!) 105/58  118/67   Pulse: 66  67   Resp: 20     Temp: 97.6 F (36.4 C)  97.7 F (36.5 C)   TempSrc:   Oral   SpO2: 100%  95% 100%  Weight:  110.7 kg    Height:        Intake/Output Summary (Last 24 hours) at 12/14/2019 0843 Last data filed at 12/14/2019 0537 Gross per 24 hour  Intake --  Output 4050 ml  Net -4050 ml   Last 3 Weights  12/14/2019 12/11/2019 12/10/2019  Weight (lbs) 244 lb 0.8 oz 253 lb 4.9 oz 253 lb 8.5 oz  Weight (kg) 110.7 kg 114.9 kg 115 kg      Telemetry    Not on telemetry - Personally Reviewed  ECG    No new tracings - Personally Reviewed  Physical Exam   GEN: No acute distress.  Lying in bed. Neck: JVP ~8cm Cardiac: RRR, no murmurs, rubs, or gallops.  Respiratory: CTAB. Breath sounds improved from yesterday. GI: Soft, nontender, non-distended  MS: mild to moderate bilateral edema on LLE (chronic) and no edema on RLE; No deformity. Neuro:  Nonfocal  Psych: Normal affect   Labs    High Sensitivity Troponin:   Recent Labs  Lab 12/07/19 1218 12/07/19 1435  TROPONINIHS 11 12      Chemistry Recent Labs  Lab 12/07/19 1218 12/08/19 0552 12/12/19 0512 12/13/19 0407 12/14/19 0510  NA 136   < > 135 135 137  K 4.6   < > 4.4 4.6 3.9  CL 91*   < > 92* 91* 91*  CO2 34*   < > 34* 36* 36*  GLUCOSE 114*   < > 180* 307* 155*  BUN 38*   < >  41* 45* 51*  CREATININE 1.92*   < > 1.72* 1.79* 1.74*  CALCIUM 9.1   < > 9.0 9.2 9.2  PROT 7.2  --   --   --   --   ALBUMIN 3.8  --   --   --   --   AST 19  --   --   --   --   ALT 22  --   --   --   --   ALKPHOS 25*  --   --   --   --   BILITOT 1.0  --   --   --   --   GFRNONAA 35*   < > 39* 38* 39*  GFRAA 40*   < > 46* 44* 45*  ANIONGAP 11   < > 9 8 10    < > = values in this interval not displayed.     Hematology Recent Labs  Lab 12/11/19 0513 12/13/19 0407 12/14/19 0510  WBC 5.6 8.3 8.6  RBC 4.55 5.03 5.18  HGB 9.8* 10.8* 11.3*  HCT 34.3* 38.1* 38.3*  MCV 75.4* 75.7* 73.9*  MCH 21.5* 21.5* 21.8*  MCHC 28.6* 28.3* 29.5*  RDW 19.3* 19.2* 18.8*  PLT 278 282 293    BNP Recent Labs  Lab 12/07/19 1218 12/08/19 0552  BNP 475.0* 411.0*     DDimer No results for input(s): DDIMER in the last 168 hours.   Radiology    No results found.  Cardiac Studies   Echo 12/08/19 1. Left ventricular ejection fraction, by estimation,  is 55 to 60%. The  left ventricle has normal function. The left ventricle has no regional  wall motion abnormalities. There is mild left ventricular hypertrophy.  Left ventricular diastolic parameters  are indeterminate.  2. Right ventricular systolic function is normal. The right ventricular  size is normal. Tricuspid regurgitation signal is inadequate for assessing  PA pressure.  3. Left atrial size was moderately dilated.  4. Challenging image quality   10/07/2019 CABG Cyndia Bent, Fernande Boyden, MD) R carotid endarterectomy Donnetta Hutching, Arvilla Meres, MD)  10/07/19 TEE Left Ventricle: The left ventricle has normal systolic function, with an  ejection fraction of 60-65%. The cavity size was normal. There is severely  increased left ventricular wall thickness.  Right Ventricle: The right ventricle has normal systolic function. The  cavity was normal. There is increased right ventricular wall thickness.  Left Atrium: Left atrial size was not assessed. The left atrial appendage  is well visualized and there is no evidence of thrombus present.  Right Atrium: Right atrial size was not assessed. Right atrial pressure is  estimated at 10 mmHg.  Interatrial Septum: No atrial level shunt detected by color flow Doppler.  Pericardium: There is no evidence of pericardial effusion.  Mitral Valve: The mitral valve is normal in structure. No thickening of  the mitral valve leaflet. No calcification of the mitral valve leaflet.  Mitral valve regurgitation is not visualized by color flow Doppler.  Tricuspid Valve: The tricuspid valve was normal in structure. Tricuspid  valve regurgitation was not visualized by color flow Doppler.  Aortic Valve: The aortic valve is normal in structure. There is mild  thickening of the aortic valve and There is mild calcification of the  aortic valve Aortic valve regurgitation was not visualized by color flow  Doppler. There is no evidence of aortic  valve stenosis.  Pulmonic  Valve: The pulmonic valve was normal in structure.  Pulmonic valve regurgitation is not visualized  by color flow Doppler.   10/05/2019 US Carotids Right Carotid: Velocities in the right ICA are consistent with a 80-99%         stenosis.  Left Carotid: Velocities in the left ICA are consistent with a 1-39%  stenosis.  Vertebrals: Bilateral vertebral arteries demonstrate antegrade flow.  Subclavians: Normal flow hemodynamics were seen in bilateral subclavian        arteries.  Right Upper Extremity: Doppler waveforms remain within normal limits with  right radial compression. Doppler waveforms remain within normal limits  with right ulnar compression.  Left Upper Extremity: Doppler waveforms decrease <50% w left radial  compression. Doppler waveforms remain within normal limits with left ulnar  compression.    [place]io 09/2019 Normal sinus rhythm avg HR of 77 bpm 4 Ventricular Tachycardia runs occurred, the run with the fastest interval lasting 7 beats with a max rate of 190 bpm,  the longest lasting 6 beats with an avg rate of 146 bpm.  189 Supraventricular Tachycardia runs occurred, the run with the fastest interval lasting 6 beats with a max rate of 174 bpm, the longest lasting 15 beats with an avg rate of 107 bpm. Idioventricular Rhythm was present. Isolated SVEs were occasional (4.9%, 75441),SVE Couplets were rare (<1.0%, 5401), and SVE Triplets were rare (<1.0%, 862). Isolated VEs were rare (<1.0%, 14128), VE Couplets were rare (<1.0%, 430), and VE Triplets were rare (<1.0%, 3). Ventricular Bigeminy and Trigeminy were present. No patient triggered events noted.  09/08/2019 Cardiac cath  Mid LM to Dist LM lesion is 70% stenosed.  Ost LM lesion is 40% stenosed.  Prox LAD to Mid LAD lesion is 70% stenosed.  Mid Cx lesion is 80% stenosed.  Ost RCA to Prox RCA lesion is 95% stenosed.  Mid RCA to Dist RCA lesion is 100% stenosed.  RPAV lesion is 90%  stenosed.  Hemodynamic findings consistent with moderate pulmonary hypertension.  LV end diastolic pressure is normal.  NM Study 08/03/2016 Exercise myocardial perfusion imaging study predominantly fixed inferior wall perfusion defect consistent with old MI, with mild peri-infarct ischemia.  Inferior wall hypokinesis, EF estimated at 38% No EKG changes concerning for ischemia at peak stress or in recovery. T wave abnormality anterolateral leads at rest Hypertensive with exercise Adequate exercise tolerance, 5 min 46 sec, 7 METS Target heart rate achieved Moderate risk scan given old MI, depressed EF Images consistent with known RCA occlusion  Patient Profile     69 y.o. 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  Assessment & Plan    Acute on chronic diastolic heart failure -Reports improved shortness of breath and volume status. Consider that underlying COPD, current tobacco use, and undiagnosed OSA is likely also contributing to breathing status. Eho as above with EF nl.  --I/Os accuracy unclear; however, he is documented net -17.1 L for the admission and net -4.25L yesterday. Wt 114.9kg (5/16)  110.7kg (5/19). Renal function stable with Cr 1.79  1.74 and BUN 45  51. --Currently on IV diuresis with furosemide 60mg  BID with improved volume status. Will check BNP to trend given output documentation status presumed not accurate as previously noted. Consider providing with at least 1 additional IV lasix dose before transitioning over to oral diuresis with KCL supplementation. Avoid / discontinue steroids as these  can exacerbate volume and rate status. --Ambulate before discharge home with check of oxygen saturations at that  time.  --Follow-up needs to be scheduled with the Heart Failure clinic and [facility] (Dr. Rockey Situ or APP) at discharge. Recommend follow-up BMET at outpatient.   CAD s/p CABG 09/2019 --No report of angina this AM. S/p CABG 09/2019.  --No plan for ischemic evaluation at this time.  --Continue current medical management with ASA, Plavix, and statin. Cardiac rehab as an outpatient strongly recommended, if not already scheduled.   Postoperative Paroxysmal Atrial Fibrillation --Reportedly experienced brief PAF s/p 09/2019 CABG as above.  --Maintaining NSR this admission.  --Continue PTA lopressor 25mg  BID. PTA amiodarone 400mg  daily was not continued at admission. He has not been on long term [place]. CHA2DS2VASc score of at least 5 (CHF, HTN, agex1, DM2, vascular).  As previously noted, he is at high risk for arrhythmia given his underlying respiratory distress. Avoid / discontinue steroids as these can exacerbate volume and rate status.   Essential hypertension --Most recent blood pressure stable and relatively well controlled. Continue spironolactone 25mg , lopressor 25mg  BID, amlodipine 10mg , and lasix.  Carotid artery dz s/p R sided CEA 09/2019 --Continue ASA and statin.   OSA --As previously noted, he will need follow-up scheduled with pulmonology for testing and fitting for CPAP.  Arrangements per critical care/internal medicine. Patient reports today that he received this number to call already.  Tobacco use COPD exacerbation --Remains on nicotine patch. Smoking cessation advised. Continue inhaler.  HLD --LDL 39 from 09/2019. Continue statin.  DM2 --A1C 7.4. Strict glycemic control recommended. SSI. Per IM.   CKD III --As above, renal function stable. Daily BMET.   For questions or updates, please contact [place] Please consult [email or web address] for contact info under        Signed, Arvil Chaco, PA-C  12/14/2019, 8:43 AM

## 2019-12-14 NOTE — Care Management Important Message (Signed)
Important Message  Patient Details  Name: Joshua Roy MRN: [id number] Date of Birth: January 08, 1951   Medicare Important Message Given:  Yes     [name] 12/14/2019, 10:41 AM

## 2019-12-14 NOTE — TOC Progression Note (Signed)
Transition of Care [facility]) - Progression Note    Patient Details  Name: Joshua Roy MRN: [id number] Date of Birth: Mar 19, 1951  Transition of Care [facility]) CM/SW Contact  Genella Bas, Gardiner Rhyme, LCSW Phone Number: 12/14/2019, 2:05 PM  Clinical Narrative: Pt has home O2 through [employer] no further needs. Hopefully will be compliant with follow up appointments. OP Palliative will follow at home. Wife can assist him and drive him to his appointments. No further follow due to DC today.      Expected Discharge Plan: Home/Self Care Barriers to Discharge: Continued Medical Work up  Expected Discharge Plan and Services Expected Discharge Plan: Home/Self Care       Living arrangements for the past 2 months: Single Family Home Expected Discharge Date: 12/14/19                                     Social Determinants of Health (SDOH) Interventions    Readmission Risk Interventions Readmission Risk Prevention Plan 12/08/2019  Transportation Screening Complete  PCP or Specialist Appt within 3-5 Days Complete  HRI or Home Care Consult Complete  Medication Review (RN Care Manager) Complete  Some recent data might be hidden

## 2019-12-14 NOTE — Discharge Summary (Signed)
Physician Discharge Summary  Joshua Roy YFV:[id number] DOB: Dec 20, 1950 DOA: 12/07/2019  PCP: [name], MD  Admit date: 12/07/2019 Discharge date: 12/14/2019  Admitted From: Home Disposition:  Home  Recommendations for Outpatient Follow-up:  1. Follow up with PCP in 1-2 weeks 2. Follow-up in heart failure clinic 3. Please obtain BMP/CBC in one week 4. Please follow up on the following pending results:None  Home Health:No Equipment/Devices: Home oxygen Discharge Condition: Stable CODE STATUS: DNR Diet recommendation: Heart Healthy / Carb Modified   Brief/Interim Summary: Joshua Rossell McCrickardis a 69 y.o.Caucasian malewith medical history significant forcoronary artery disease status post recent four-vessel CABG, carotid disease status post recent carotid endarterectomy, history of A. 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 Patient complains of easy fatigability as well as trouble sleeping. He also complains of dyspnea with exertion, bilateral lower extremity swelling, increased abdominal girth, orthopnea and a 14 pound weight gain since his last hospitalization. He reports compliance with his medications but does not think he has been compliant with his diet. He was admitted for acute on chronic diastolic heart failure.  He was diuresed with IV Lasix initially.  Diuresed well with weight decreased from 259 to 244 pounds on the day of discharge with a total of -17 L.  Patient was feeling better on the day of discharge and he was discharged on a increased dose of home Lasix of 40 mg twice daily instead of once and he will follow-up with heart failure clinic in 2 weeks for further recommendations.  He will continue his Aldactone.  Patient was also found to be hypoxic and hypercapnic, initially  requiring BiPAP, later weaned off to his home oxygen of 2 L.  Patient mostly using oxygen at night at home.  There is some concern of obstructive sleep apnea and patient was scheduled for outpatient sleep study which he missed due to current hospitalization.  Patient need to reschedule his sleep study for a possible CPAP/BiPAP at night.  Initially he was started on COPD exacerbation treatment with steroid as he was not responding well to diuretic.  There was no wheezing and prednisone was not continued on discharge.  Patient has an history of CKD stage IIIb which remained stable with diuresis.  Patient has an history of A. Fib.  Not on any anticoagulation.  CHA2DS2-VASc score of 5, patient need to discuss with his cardiologist regarding anticoagulation.  Rate controlled with Lopressor.  Amiodarone was mentioned as an outpatient medication which was not continued during hospitalization and discharge.  He will continue rest of his home meds.  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   Complete by: As directed    Diet - low sodium heart healthy   Complete by: As directed    Discharge instructions   Complete by: As directed    It was pleasure taking  care of you. We increase the dose of lasix from once daily to twice daily.Please take it as directed and follow up with your cardiologist.   Increase activity slowly   Complete by: As directed      Allergies as of 12/14/2019      Reactions   Ramipril Cough   Mucinex D [pseudoephedrine-guaifenesin Er] Other (See Comments), Hypertension   Elevated BP, insomnia      Medication List    STOP taking these medications   amiodarone 400 MG tablet Commonly known as: PACERONE     TAKE these medications   acetaminophen 500 MG tablet Commonly known as: TYLENOL Take 1,000 mg by mouth every 8 (eight) hours as needed for mild pain or headache.   ALPRAZolam 0.25 MG tablet Commonly known as: XANAX Take 0.25 mg by mouth 2 (two) times daily as needed for anxiety.   amLODipine 10 MG tablet Commonly known as: NORVASC Take 1 tablet (10 mg total) by mouth daily.   aspirin EC 81 MG tablet Take 1 tablet (81 mg total) by mouth daily.   atorvastatin 80 MG tablet Commonly known as: LIPITOR Take 1 tablet (80 mg total) by mouth daily.   clopidogrel 75 MG tablet Commonly known as: PLAVIX Take 75 mg by mouth daily.   fenofibrate 160 MG tablet Take 1 tablet (160 mg total) by mouth daily.   furosemide 40 MG tablet Commonly known as: LASIX Take 1 tablet (40 mg total) by mouth 2 (two) times daily. What changed: when to take this   glimepiride 2 MG tablet Commonly known as: AMARYL Take 2 mg by mouth daily with breakfast.   metFORMIN 1000 MG tablet Commonly known as: GLUCOPHAGE Take 1 tablet (1,000 mg total) by mouth 2 (two) times daily.   metoprolol tartrate 25 MG tablet Commonly known as: LOPRESSOR Take 1 tablet (25 mg total) by mouth 2 (two) times daily.   nicotine 21 mg/24hr patch Commonly known as: NICODERM CQ - dosed in mg/24 hours Place 21 mg onto the skin daily as needed (nicotine dependence).   nitroGLYCERIN 0.4 MG SL tablet Commonly known as: NITROSTAT Place 0.4 mg under the  tongue every 5 (five) minutes as needed for chest pain.   pantoprazole 40 MG tablet Commonly known as: PROTONIX Take 40 mg by mouth daily before breakfast.   spironolactone 25 MG tablet Commonly known as: ALDACTONE Take 1 tablet (25 mg total) by mouth daily.   traZODone 50 MG tablet Commonly known as: DESYREL Take 50 mg by mouth at bedtime as needed for sleep.      Follow-up Information    [name], MD Follow up.   Specialties: Internal Medicine, Cardiology Contact information: [place] [address] [phone]        Minna Merritts, MD .   Specialty: Cardiology Contact information: [address] [place] [address] [phone]          Allergies  Allergen Reactions  . Ramipril Cough  . Mucinex D [Pseudoephedrine-Guaifenesin Er] Other (See Comments) and Hypertension    Elevated BP, insomnia    Consultations:  Cardiology.  Procedures/Studies: DG Chest 1 View  Result Date: 12/07/2019 CLINICAL DATA:  Increasing shortness  of breath EXAM: CHEST  1 VIEW COMPARISON:  Film from earlier in the same day. FINDINGS: Cardiac shadow is stable. Aortic calcifications are again noted and stable. Lungs are well aerated bilaterally. Mild vascular congestion is again seen and stable. No new focal infiltrate is seen. No bony abnormality is noted. IMPRESSION: Stable vascular congestion.  No new focal abnormality is noted. Electronically Signed   By: Inez Catalina M.D.   On: 12/07/2019 21:21   DG Chest Port 1 View  Result Date: 12/07/2019 CLINICAL DATA:  Hypoxemia EXAM: PORTABLE CHEST 1 VIEW COMPARISON:  11/02/2019 FINDINGS: Mild cardiac enlargement. Mild vascular congestion without edema. Small left effusion. Negative for pneumonia. Mild atelectasis in the bases. IMPRESSION: Mild vascular congestion and small left effusion. Negative for edema. Electronically Signed   By: Franchot Gallo M.D.   On: 12/07/2019 12:58   ECHOCARDIOGRAM  COMPLETE  Result Date: 12/08/2019    ECHOCARDIOGRAM REPORT   Patient Name:   BRALYNN DONADO [facility] Date of Exam: 12/08/2019 Medical Rec #:  [id number]            Height:       70.0 in Accession #:    [id number]           Weight:       255.3 lb Date of Birth:  05-Mar-1951           BSA:          2.315 m Patient Age:    69 years             BP:           119/61 mmHg Patient Gender: M                    HR:           81 bpm. Exam Location:  [facility] Procedure: 2D Echo, Cardiac Doppler and Color Doppler Indications:     CHF- acute diastolic 267.12  History:         Patient has prior history of Echocardiogram examinations, most                  recent 10/07/2019. Prior CABG, COPD; Risk Factors:Hypertension.                  MI, PAF.  Sonographer:     Sherrie Sport RDCS (AE) Referring Phys:  [id number] Bradly Bienenstock Diagnosing Phys: Ida Rogue MD  Sonographer Comments: No apical window and no subcostal window. Image acquisition challenging due to COPD. IMPRESSIONS  1. Left ventricular ejection fraction, by estimation, is 55 to 60%. The left ventricle has normal function. The left ventricle has no regional wall motion abnormalities. There is mild left ventricular hypertrophy. Left ventricular diastolic parameters are indeterminate.  2. Right ventricular systolic function is normal. The right ventricular size is normal. Tricuspid regurgitation signal is inadequate for assessing PA pressure.  3. Left atrial size was moderately dilated.  4. Challenging image quality FINDINGS  Left Ventricle: Left ventricular ejection fraction, by estimation, is 55 to 60%. The left ventricle has normal function. The left ventricle has no regional wall motion abnormalities. The left ventricular internal cavity size was normal in size. There is  mild left ventricular hypertrophy. Left ventricular diastolic parameters are indeterminate. Right Ventricle: The right ventricular size is normal. No increase in right ventricular wall thickness. Right  ventricular systolic function is normal. Tricuspid regurgitation signal is inadequate for assessing PA pressure. Left Atrium: Left atrial size  was moderately dilated. Right Atrium: Right atrial size was normal in size. Pericardium: A small pericardial effusion is present. Mitral Valve: The mitral valve is normal in structure. Normal mobility of the mitral valve leaflets. No evidence of mitral valve regurgitation. No evidence of mitral valve stenosis. Tricuspid Valve: The tricuspid valve is normal in structure. Tricuspid valve regurgitation is mild . No evidence of tricuspid stenosis. Aortic Valve: The aortic valve is normal in structure. Aortic valve regurgitation is not visualized. Mild aortic valve sclerosis is present, with no evidence of aortic valve stenosis. Pulmonic Valve: The pulmonic valve was normal in structure. Pulmonic valve regurgitation is not visualized. No evidence of pulmonic stenosis. Aorta: The aortic root is normal in size and structure. Venous: The inferior vena cava is normal in size with greater than 50% respiratory variability, suggesting right atrial pressure of 3 mmHg. IAS/Shunts: No atrial level shunt detected by color flow Doppler.  LEFT VENTRICLE PLAX 2D LVIDd:         5.21 cm LVIDs:         3.68 cm LV PW:         1.26 cm LV IVS:        1.52 cm LVOT diam:     2.10 cm LVOT Area:     3.46 cm  LEFT ATRIUM         Index LA diam:    4.70 cm 2.03 cm/m                        PULMONIC VALVE AORTA                 PV Vmax:        0.66 m/s Ao Root diam: 3.00 cm PV Peak grad:   1.7 mmHg                       RVOT Peak grad: 2 mmHg   SHUNTS Systemic Diam: 2.10 cm Ida Rogue MD Electronically signed by Ida Rogue MD Signature Date/Time: 12/08/2019/3:01:46 PM    Final      Subjective: Patient was feeling better when seen today.  He was eager to go home.  No new complaints.  Denies any chest pain or shortness of breath.  Discharge Exam: Vitals:   12/14/19 0738 12/14/19 0846  BP:     Pulse:  66  Resp:    Temp:    SpO2: 100%    Vitals:   12/14/19 0500 12/14/19 0733 12/14/19 0738 12/14/19 0846  BP:  118/67    Pulse:  67  66  Resp:      Temp:  97.7 F (36.5 C)    TempSrc:  Oral    SpO2:  95% 100%   Weight: 110.7 kg     Height:        General: Pt is alert, awake, not in acute distress Cardiovascular: RRR, S1/S2 +, no rubs, no gallops Respiratory: CTA bilaterally, no wheezing, no rhonchi Abdominal: Soft, NT, ND, bowel sounds + Extremities: 1+LE edema, no cyanosis   The results of significant diagnostics from this hospitalization (including imaging, microbiology, ancillary and laboratory) are listed below for reference.    Microbiology: Recent Results (from the past 240 hour(s))  SARS Coronavirus 2 by RT PCR (hospital order, performed in [facility] hospital lab) Nasopharyngeal Nasopharyngeal Swab     Status: None   Collection Time: 12/07/19 12:18 PM   Specimen: Nasopharyngeal Swab  Result Value Ref  Range Status   SARS Coronavirus 2 NEGATIVE NEGATIVE Final    Comment: (NOTE) SARS-CoV-2 target nucleic acids are NOT DETECTED. The SARS-CoV-2 RNA is generally detectable in upper and lower respiratory specimens during the acute phase of infection. The lowest concentration of SARS-CoV-2 viral copies this assay can detect is 250 copies / mL. A negative result does not preclude SARS-CoV-2 infection and should not be used as the sole basis for treatment or other patient management decisions.  A negative result may occur with improper specimen collection / handling, submission of specimen other than nasopharyngeal swab, presence of viral mutation(s) within the areas targeted by this assay, and inadequate number of viral copies (<250 copies / mL). A negative result must be combined with clinical observations, patient history, and epidemiological information. Fact Sheet for Patients:   StrictlyIdeas.no Fact Sheet for Healthcare  Providers: BankingDealers.co.za This test is not yet approved or cleared  by the [place] FDA and has been authorized for detection and/or diagnosis of SARS-CoV-2 by FDA under an Emergency Use Authorization (EUA).  This EUA will remain in effect (meaning this test can be used) for the duration of the COVID-19 declaration under Section 564(b)(1) of the Act, 21 U.S.C. section 360bbb-3(b)(1), unless the authorization is terminated or revoked sooner. Performed at [facility], [address] [place], [place] [address]      Labs: BNP (last 3 results) Recent Labs    12/07/19 1218 12/08/19 0552 12/14/19 0510  BNP 475.0* 411.0* 829.9*   Basic Metabolic Panel: Recent Labs  Lab 12/09/19 0336 12/09/19 0336 12/10/19 0433 12/11/19 0513 12/12/19 0512 12/13/19 0407 12/14/19 0510  NA 137   < > 136 134* 135 135 137  K 4.6   < > 4.9 4.9 4.4 4.6 3.9  CL 90*   < > 87* 89* 92* 91* 91*  CO2 38*   < > 40* 36* 34* 36* 36*  GLUCOSE 84   < > 215* 272* 180* 307* 155*  BUN 50*   < > 46* 42* 41* 45* 51*  CREATININE 1.91*   < > 1.92* 1.72* 1.72* 1.79* 1.74*  CALCIUM 8.5*   < > 8.7* 8.8* 9.0 9.2 9.2  MG 2.2  --  2.2 2.3  --  2.3 2.1   < > = values in this interval not displayed.   Liver Function Tests: No results for input(s): AST, ALT, ALKPHOS, BILITOT, PROT, ALBUMIN in the last 168 hours. No results for input(s): LIPASE, AMYLASE in the last 168 hours. No results for input(s): AMMONIA in the last 168 hours. CBC: Recent Labs  Lab 12/09/19 0336 12/10/19 0433 12/11/19 0513 12/13/19 0407 12/14/19 0510  WBC 8.3 6.2 5.6 8.3 8.6  HGB 9.4* 9.8* 9.8* 10.8* 11.3*  HCT 32.8* 34.1* 34.3* 38.1* 38.3*  MCV 74.7* 74.8* 75.4* 75.7* 73.9*  PLT 287 292 278 282 293   Cardiac Enzymes: No results for input(s): CKTOTAL, CKMB, CKMBINDEX, TROPONINI in the last 168 hours. BNP: Invalid input(s): POCBNP CBG: Recent Labs  Lab 12/13/19 1150 12/13/19 1711 12/13/19 2110  12/14/19 0735 12/14/19 1150  GLUCAP 250* 320* 249* 118* 154*   D-Dimer No results for input(s): DDIMER in the last 72 hours. Hgb A1c No results for input(s): HGBA1C in the last 72 hours. Lipid Profile No results for input(s): CHOL, HDL, LDLCALC, TRIG, CHOLHDL, LDLDIRECT in the last 72 hours. Thyroid function studies No results for input(s): TSH, T4TOTAL, T3FREE, THYROIDAB in the last 72 hours.  Invalid input(s): FREET3 Anemia work up No  results for input(s): VITAMINB12, FOLATE, FERRITIN, TIBC, IRON, RETICCTPCT in the last 72 hours. Urinalysis    Component Value Date/Time   COLORURINE STRAW (A) 10/05/2019 0957   APPEARANCEUR CLEAR 10/05/2019 0957   LABSPEC 1.005 10/05/2019 0957   PHURINE 5.0 10/05/2019 0957   GLUCOSEU NEGATIVE 10/05/2019 0957   HGBUR NEGATIVE 10/05/2019 0957   BILIRUBINUR NEGATIVE 10/05/2019 0957   KETONESUR NEGATIVE 10/05/2019 0957   PROTEINUR NEGATIVE 10/05/2019 0957   NITRITE NEGATIVE 10/05/2019 0957   LEUKOCYTESUR NEGATIVE 10/05/2019 0957   Sepsis Labs Invalid input(s): PROCALCITONIN,  WBC,  LACTICIDVEN Microbiology Recent Results (from the past 240 hour(s))  SARS Coronavirus 2 by RT PCR (hospital order, performed in [place] hospital lab) Nasopharyngeal Nasopharyngeal Swab     Status: None   Collection Time: 12/07/19 12:18 PM   Specimen: Nasopharyngeal Swab  Result Value Ref Range Status   SARS Coronavirus 2 NEGATIVE NEGATIVE Final    Comment: (NOTE) SARS-CoV-2 target nucleic acids are NOT DETECTED. The SARS-CoV-2 RNA is generally detectable in upper and lower respiratory specimens during the acute phase of infection. The lowest concentration of SARS-CoV-2 viral copies this assay can detect is 250 copies / mL. A negative result does not preclude SARS-CoV-2 infection and should not be used as the sole basis for treatment or other patient management decisions.  A negative result may occur with improper specimen collection / handling, submission  of specimen other than nasopharyngeal swab, presence of viral mutation(s) within the areas targeted by this assay, and inadequate number of viral copies (<250 copies / mL). A negative result must be combined with clinical observations, patient history, and epidemiological information. Fact Sheet for Patients:   StrictlyIdeas.no Fact Sheet for Healthcare Providers: BankingDealers.co.za This test is not yet approved or cleared  by the [place] FDA and has been authorized for detection and/or diagnosis of SARS-CoV-2 by FDA under an Emergency Use Authorization (EUA).  This EUA will remain in effect (meaning this test can be used) for the duration of the COVID-19 declaration under Section 564(b)(1) of the Act, 21 U.S.C. section 360bbb-3(b)(1), unless the authorization is terminated or revoked sooner. Performed at [facility], [address] [place], [place] [address]     Time coordinating discharge: Over 30 minutes  SIGNED:  Lorella Nimrod, MD  [facility] 12/14/2019, 1:36 PM  If 7PM-7AM, please contact night-coverage [email or web address]  This record has been created using Systems analyst. Errors have been sought and corrected,but may not always be located. Such creation errors do not reflect on the standard of care.

## 2019-12-16 ENCOUNTER — Telehealth: Payer: Self-pay

## 2019-12-16 NOTE — Telephone Encounter (Signed)
Telephone call to schedule palliative care visit.  RN spoke with patients wife Joshua Roy.  Wife in agreement with RN makin ghome visit 12/20/19 at 12:30 PM.

## 2019-12-19 NOTE — Progress Notes (Signed)
Cardiology Office Note  Date:  12/20/2019   ID:  Joshua Roy, DOB [birth year], MRN [id number]  PCP:  [name], MD   Chief Complaint  Patient presents with  . OTHER    6-8 wk f/u no complaints today. Meds reviewed verbally with pt.    HPI:  Joshua Roy is 69 year old gentleman with a history of  coronary artery disease,  occluded mid to distal RCA  by cardiac catheterization in 2001,  residual 60% proximal to mid LAD disease at that time, 30% diagonal disease, 10% left main disease, 20% proximal left circumflex disease.  smoking and continues to smoke at least 1 1/2 pack per day hyperlipidemia,  obesity and diabetes   bypass surgery March 2021, known carotid arterial disease prior carotid endarterectomy March 2021, postoperative atrial fibrillation He presents today for follow-up of his coronary artery disease, CHF  Last seen by myself in clinic 2018  Seen in the office May 2021, CHF exacerbation, sent to the hospital Long stay in the hospital with multiorgan failure COPD, anemia, acute on chronic kidney failure, difficulty with diuresis in the setting of CHF,  Management limited by his anxiety and desire to go home Chronic hypoxia saturations in the 80s on 10 L oxygen throughout his hospital course -Also treated for bronchitis Poorly controlled diabetes He was in denial as to his numerous medical issues and continued desire to go home despite saturations in the low 80s on 10 L " I can do it at home" Wife was not available at the bedside for discussions on the days that I was rounding -He blamed the office (presumably cardiology office) for not fixing his symptoms when he called week earlier but there was no documentation of him calling with CHF exacerbation symptoms  It was recommended he proceed with home hospice We have since received a call from his wife saying she is out of the loop but does not know what this is all about  Weight in the hospital 264, had  aggressive diuresis during his hospital course  Weight 233 in the office today At home 229 pounds  Wants to stay on lasix 40 BID Feels if he cuts back on the Lasix he will have fluid retention, and that back in the hospital  Reviewed lab work with him from the past week CR 1.7, stable Potassium slight drift downward but stable  Continues to smoke Saturations in the 90s per his measurements and again on his visit today  EKG personally reviewed by myself on todays visit Shows normal sinus rhythm rate 79 bpm consider anteroseptal MI  Other past medical history His last stress test was 2009 when he had a small fixed inferior perfusion defect, ejection fraction 51%  PMH:   has a past medical history of (HFpEF) heart failure with preserved ejection fraction ([place]), ACE-inhibitor cough, Anxiety, Bronchitis (06/27/2016), Carotid arterial disease ([place]), CKD (chronic kidney disease), stage III, COPD (chronic obstructive pulmonary disease) ([place]), Coronary artery disease, Diabetes mellitus type II, Hyperlipidemia, Hypertension, MI (myocardial infarction) ([place]) (age 81), OA (osteoarthritis), PAF (paroxysmal atrial fibrillation) ([place]), Pneumonia, PSVT (paroxysmal supraventricular tachycardia) ([place]), and Tobacco abuse.  PSH:    Past Surgical History:  Procedure Laterality Date  . CARDIAC CATHETERIZATION  05/06/2000   @ [place]  . COLONOSCOPY WITH PROPOFOL N/A 04/20/2018   Procedure: COLONOSCOPY WITH PROPOFOL;  Surgeon: [name], MD;  Location: [facility] ENDOSCOPY;  Service: Endoscopy;  Laterality: N/A;  . CORONARY ARTERY BYPASS GRAFT N/A 10/07/2019   Procedure: CORONARY ARTERY BYPASS  GRAFTING (CABG) using LIMA to LAD; Endoscopic harvesting of left greater saphenous vein: SVG to RAMUS; SVG to OM1; SVG to PDA.;  Surgeon: Gaye Pollack, MD;  Location: [facility] OR;  Service: Open Heart Surgery;  Laterality: N/A;  . ENDARTERECTOMY Right 10/07/2019   Procedure: ENDARTERECTOMY CAROTID;  Surgeon: Rosetta Posner, MD;   Location: [facility] OR;  Service: Vascular;  Laterality: Right;  . ENDOVEIN HARVEST OF GREATER SAPHENOUS VEIN Left 10/07/2019   Procedure: [place] Of Greater Saphenous Vein;  Surgeon: Gaye Pollack, MD;  Location[facility] OR;  Service: Open Heart Surgery;  Laterality: Left;  . ESOPHAGOGASTRODUODENOSCOPY ENDOSCOPY    . KNEE ARTHROSCOPY  07/25/04   left  . [place]   right, open surgery- repair  . RIGHT/LEFT HEART CATH AND CORONARY ANGIOGRAPHY Bilateral 09/08/2019   Procedure: RIGHT/LEFT HEART CATH AND CORONARY ANGIOGRAPHY;  Surgeon: Minna Merritts, MD;  Location: [place] CV LAB;  Service: Cardiovascular;  Laterality: Bilateral;  . TEE WITHOUT CARDIOVERSION N/A 10/07/2019   Procedure: TRANSESOPHAGEAL ECHOCARDIOGRAM (TEE);  Surgeon: Gaye Pollack, MD;  Location: [facility];  Service: Open Heart Surgery;  Laterality: N/A;  . 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 TOTAL KNEE ARTHROPLASTY Right 07/13/2017   Procedure: RIGHT TOTAL KNEE ARTHROPLASTY;  Surgeon: Gaynelle Arabian, MD;  Location: [facility] ORS;  Service: Orthopedics;  Laterality: Right;    Current Outpatient Medications  Medication Sig Dispense Refill  . acetaminophen (TYLENOL) 500 MG tablet Take 1,000 mg by mouth every 8 (eight) hours as needed for mild pain or headache.    . ALPRAZolam (XANAX) 0.25 MG tablet Take 0.25 mg by mouth 2 (two) times daily as needed for anxiety.     Marland Kitchen amLODipine (NORVASC) 10 MG tablet Take 1 tablet (10 mg total) by mouth daily. (Patient not taking: Reported on 12/20/2019) 90 tablet 3  . aspirin EC 81 MG tablet Take 1 tablet (81 mg total) by mouth daily.    Marland Kitchen atorvastatin (LIPITOR) 80 MG tablet Take 1 tablet (80 mg total) by mouth daily. 90 tablet 3  . clopidogrel (PLAVIX) 75 MG tablet Take 75 mg by mouth daily.    . fenofibrate 160 MG tablet Take 1 tablet (160 mg total)  by mouth daily. 90 tablet 1  . furosemide (LASIX) 40 MG tablet Take 1 tablet (40 mg total) by mouth 2 (two) times daily. 60 tablet 1  . glimepiride (AMARYL) 2 MG tablet Take 2 mg by mouth daily with breakfast.    . metFORMIN (GLUCOPHAGE) 1000 MG tablet Take 1 tablet (1,000 mg total) by mouth 2 (two) times daily. 180 tablet 1  . metoprolol tartrate (LOPRESSOR) 25 MG tablet Take 1 tablet (25 mg total) by mouth 2 (two) times daily. 180 tablet 1  . nicotine (NICODERM CQ - DOSED IN MG/24 HOURS) 21 mg/24hr patch Place 21 mg onto the skin daily as needed (nicotine dependence).     . nitroGLYCERIN (NITROSTAT) 0.4 MG SL tablet Place 0.4 mg under the tongue every 5 (five) minutes as needed for chest pain.    . pantoprazole (PROTONIX) 40 MG tablet Take 40 mg by mouth daily before breakfast.    . spironolactone (ALDACTONE) 25 MG tablet Take 1 tablet (25 mg total) by mouth daily. 90 tablet 3  . traZODone (DESYREL) 50 MG tablet Take 50 mg by mouth at bedtime as needed  for sleep.     No current facility-administered medications for this visit.     Allergies:   Ramipril and Mucinex d [pseudoephedrine-guaifenesin er]   Social History:  The patient  reports that he quit smoking about 2 months ago. He has a 40.00 pack-year smoking history. He has never used smokeless tobacco. He reports current alcohol use. He reports that he does not use drugs.   Family History:   family history includes Alcohol abuse in his father; Cancer in his brother; Colon cancer in his brother; Diabetes in his father and sister; Heart disease in his father and sister; Hypertension in his father.    Review of Systems: Review of Systems  Constitutional: Negative.   Respiratory: Negative.   Cardiovascular: Negative.   Gastrointestinal: Negative.   Musculoskeletal: Negative.   Neurological: Negative.   Psychiatric/Behavioral: Negative.   All other systems reviewed and are negative.    PHYSICAL EXAM: VS:  BP 140/70 (BP Location:  Left Arm, Patient Position: Sitting, Cuff Size: Normal)   Pulse 79   Ht 5\' 10"  (1.778 m)   Wt 233 lb (105.7 kg)   SpO2 95%   BMI 33.43 kg/m  , BMI Body mass index is 33.43 kg/m. GEN: Well nourished, well developed, in no acute distress  HEENT: normal  Neck: no JVD, carotid bruits, or masses Cardiac: RRR; no murmurs, rubs, or gallops, Trace edema left lower extremity below the knees, none on the right Respiratory: Coarse breath sounds GI: soft, nontender, nondistended, + BS MS: no deformity or atrophy  Skin: warm and dry, no rash Neuro:  Strength and sensation are intact Psych: euthymic mood, full affect  Recent Labs: 12/07/2019: ALT 22 12/14/2019: B Natriuretic Peptide 292.6; BUN 51; Creatinine, Ser 1.74; Hemoglobin 11.3; Magnesium 2.1; Platelets 293; Potassium 3.9; Sodium 137    Lipid Panel Lab Results  Component Value Date   CHOL 84 10/11/2019   HDL 26 (L) 10/11/2019   LDLCALC 39 10/11/2019   TRIG 93 10/11/2019      Wt Readings from Last 3 Encounters:  12/20/19 233 lb (105.7 kg)  12/20/19 229 lb (103.9 kg)  12/14/19 244 lb 0.8 oz (110.7 kg)       ASSESSMENT AND PLAN:  Coronary artery disease of native artery of native heart with stable angina pectoris (HCC) Recent bypass surgery, Declining cardiac rehab at this time Stressed importance of smoking cessation  Uncontrolled type 2 diabetes mellitus with circulatory disorder causing erectile dysfunction ([place]) We have encouraged continued exercise, careful diet management in an effort to lose weight.  Chronic diastolic CHF Recent hospitalization, diuresed 30 pounds Stressed importance of staying on his medications, goal weight 230 pounds Discussed that he should call us for weight dropped 225 pounds or for weight gain 235 pounds  Mixed hyperlipidemia Cholesterol is at goal on the current lipid regimen. No changes to the medications were made.  Essential hypertension Blood pressure is well controlled on today's  visit. No changes made to the medications.  DISORDER, TOBACCO USE Long discussion with him concerning his cigarettes, Difficulty stopping  Centrilobular emphysema ([place]) Has hospice following for COPD, cardiorenal syndrome Now saturations in the 90s   Morbid obesity ([place]) We have encouraged continued exercise, careful diet management in an effort to lose weight.   Total encounter time more than 45 minutes  Greater than 50% was spent in counseling and coordination of care with the patient   Disposition:   F/U  6 months   Orders Placed This Encounter  Procedures  .  EKG 12-Lead     Signed, Esmond Plants, M.D., Ph.D. 12/20/2019  [place], [place]

## 2019-12-20 ENCOUNTER — Ambulatory Visit (INDEPENDENT_AMBULATORY_CARE_PROVIDER_SITE_OTHER): Payer: Medicare Other | Admitting: Cardiovascular Disease

## 2019-12-20 ENCOUNTER — Other Ambulatory Visit: Payer: Medicare Other

## 2019-12-20 ENCOUNTER — Other Ambulatory Visit: Payer: Self-pay

## 2019-12-20 ENCOUNTER — Encounter: Payer: Self-pay | Admitting: Cardiovascular Disease

## 2019-12-20 VITALS — BP 140/70 | HR 79 | Ht 70.0 in | Wt 233.0 lb

## 2019-12-20 DIAGNOSIS — I2510 Atherosclerotic heart disease of native coronary artery without angina pectoris: Secondary | ICD-10-CM | POA: Diagnosis not present

## 2019-12-20 DIAGNOSIS — E785 Hyperlipidemia, unspecified: Secondary | ICD-10-CM

## 2019-12-20 DIAGNOSIS — I200 Unstable angina: Secondary | ICD-10-CM

## 2019-12-20 DIAGNOSIS — J441 Chronic obstructive pulmonary disease with (acute) exacerbation: Secondary | ICD-10-CM | POA: Diagnosis not present

## 2019-12-20 DIAGNOSIS — I6521 Occlusion and stenosis of right carotid artery: Secondary | ICD-10-CM

## 2019-12-20 DIAGNOSIS — I5033 Acute on chronic diastolic (congestive) heart failure: Secondary | ICD-10-CM

## 2019-12-20 DIAGNOSIS — I480 Paroxysmal atrial fibrillation: Secondary | ICD-10-CM | POA: Diagnosis not present

## 2019-12-20 DIAGNOSIS — Z720 Tobacco use: Secondary | ICD-10-CM

## 2019-12-20 DIAGNOSIS — I10 Essential (primary) hypertension: Secondary | ICD-10-CM | POA: Diagnosis not present

## 2019-12-20 DIAGNOSIS — N183 Chronic kidney disease, stage 3 unspecified: Secondary | ICD-10-CM | POA: Diagnosis not present

## 2019-12-20 DIAGNOSIS — Z515 Encounter for palliative care: Secondary | ICD-10-CM

## 2019-12-20 NOTE — Progress Notes (Signed)
PATIENT NAME: Joshua Roy DOB: [birth year] MRN: [id number]  PRIMARY CARE PROVIDER: [name], MD  RESPONSIBLE PARTY:  Acct ID - Guarantor Home Phone Work Phone Relationship Acct Type  [id number] [facility],* [phone]  Self P/F     [place], [place], [place] [address]    PLAN OF CARE and INTERVENTIONS:               1.  GOALS OF CARE/ ADVANCE CARE PLANNING: Patient wants to get stronger and spend time doing activities he enjoys.               2.  PATIENT/CAREGIVER EDUCATION: Education on need to weigh self daily at same time on same scale, education on s/s of fluid overload, education on palliative care services, reviewed meds                3.  DISEASE STATUS: RN made scheduled palliative care home visit. Nurse met with patient and patients wife [name]. Patient is a 69 year old patient of Dr. [name]. Patient's past medical history includes but not limited to heart disease, type 2 diabetes, hypertension, CAD, COPD, history of respiratory failure, gastric ulcer, seborrheic keratosis, osteoarthritis, hyperlipidemia, history of tobacco abuse, shortness of breath, obesity, history of: polyps, and CABG x 4. Patient underwent CABG in March. Patient reports he was in the hospital for eight days. Patient received [place]  For a short while but is no longer receiving services. Patient alert and oriented. Patient denies having any pain at the present time. Patient reports he is continues to feel weak but feels much better since having CABG surgery. Patient reports he is to see Dr Rockey Situ later this afternoon. Patient reports having a 16 pound weight gain with O2 SATs in the 60s witch required him to have a hospitalization earlier this month. Patient is on 40 mg of Lasix twice a day as well as Aldactone.  Education to patient to speak with Dr Rockey Situ about having protocol in place should he have more than a 3  pound weight gain to possibly take extra Lasix. Patient is on home  oxygen at night at 3.5 liters. Patient reports he has lost 20 pounds this year. Patients current weight is 229 lbs. Patient is able to drive and complete his own ADL's.  Patient reports having shortness of breath on exertion. Patient reports he has a cough of clear to pale yellow sputum production. Patient scheduled to have sleep apnea study next week. Patient reports he has been sleeping better at night and takes Trazodone to aid with sleep. Patients vital signs are stable. Patient has 1+ edema in his lower extremities left greater than the right. Patient states he tries to keep feet elevated. Patient in agreement with palliative care services for a short while until he gets stronger. Nurse reviewed patient's medications with patient and wife. Patient wife report patient has a living will and [place] of attorney. Wife reports patient was sent home with a DNR. Patient and wife informed palliative care team would be in contact to schedule visit next month. Wife and patient encouraged to contact palliative care with questions or concerns or change in patient status. Patient wife verbalize understanding.     HISTORY OF PRESENT ILLNESS: Patient is a 69 year old male who resides at home with his wife [name].  Patient and wife agreeable to palliative care services.  Patient will be seen monthly and PRN.      CODE STATUS:  DNR  ADVANCED DIRECTIVES: Y MOST FORM: No PPS: 60%   PHYSICAL EXAM:   VITALS: Today's Vitals   12/20/19 1242  BP: 122/72  Pulse: 70  Resp: 16  Temp: 98.2 F (36.8 C)  TempSrc: Temporal  SpO2: 97%  Weight: 229 lb (103.9 kg)  Height: _0  (1.778 m)  PainSc: 0-No pain    LUNGS: clear to auscultation  CARDIAC: Cor RRR  EXTREMITIES: 1+ edema SKIN: Skin color, texture, turgor normal. No rashes or lesions  NEURO: positive for weakness       Nilda Simmer, RN

## 2019-12-20 NOTE — Patient Instructions (Addendum)
Daily weights Goal weight 230 pounds If it runs to 225, call the office If it runs high, 235 pounds   Medication Instructions:  No changes  If you need a refill on your cardiac medications before your next appointment, please call your pharmacy.    Lab work: No new labs needed   If you have labs (blood work) drawn today and your tests are completely normal, you will receive your results only by: Marland Kitchen MyChart Message (if you have MyChart) OR . A paper copy in the mail If you have any lab test that is abnormal or we need to change your treatment, we will call you to review the results.   Testing/Procedures: No new testing needed   Follow-Up: At [facility], you and your health needs are our priority.  As part of our continuing mission to provide you with exceptional heart care, we have created designated Provider Care Teams.  These Care Teams include your primary Cardiologist (physician) and Advanced Practice Providers (APPs -  Physician Assistants and Nurse Practitioners) who all work together to provide you with the care you need, when you need it.  . You will need a follow up appointment in 3 months   . Providers on your designated Care Team:   . Murray Hodgkins, NP . Christell Faith, PA-C . Marrianne Mood, PA-C  Any Other Special Instructions Will Be Listed Below (If Applicable).  For educational health videos Log in to : [email or web address] Or : [email or web address], password : [email or web address]

## 2019-12-22 ENCOUNTER — Encounter: Payer: Self-pay | Admitting: Internal Medicine

## 2019-12-22 ENCOUNTER — Ambulatory Visit (INDEPENDENT_AMBULATORY_CARE_PROVIDER_SITE_OTHER): Payer: Medicare Other | Admitting: Internal Medicine

## 2019-12-22 ENCOUNTER — Other Ambulatory Visit: Payer: Self-pay

## 2019-12-22 VITALS — BP 98/52 | HR 77 | Wt 237.5 lb

## 2019-12-22 DIAGNOSIS — I10 Essential (primary) hypertension: Secondary | ICD-10-CM

## 2019-12-22 DIAGNOSIS — F17200 Nicotine dependence, unspecified, uncomplicated: Secondary | ICD-10-CM | POA: Diagnosis not present

## 2019-12-22 DIAGNOSIS — E1165 Type 2 diabetes mellitus with hyperglycemia: Secondary | ICD-10-CM

## 2019-12-22 DIAGNOSIS — J301 Allergic rhinitis due to pollen: Secondary | ICD-10-CM

## 2019-12-22 DIAGNOSIS — Z951 Presence of aortocoronary bypass graft: Secondary | ICD-10-CM

## 2019-12-22 DIAGNOSIS — E1159 Type 2 diabetes mellitus with other circulatory complications: Secondary | ICD-10-CM

## 2019-12-22 DIAGNOSIS — I5033 Acute on chronic diastolic (congestive) heart failure: Secondary | ICD-10-CM | POA: Diagnosis not present

## 2019-12-22 DIAGNOSIS — N521 Erectile dysfunction due to diseases classified elsewhere: Secondary | ICD-10-CM

## 2019-12-22 DIAGNOSIS — I25118 Atherosclerotic heart disease of native coronary artery with other forms of angina pectoris: Secondary | ICD-10-CM

## 2019-12-22 DIAGNOSIS — IMO0002 Reserved for concepts with insufficient information to code with codable children: Secondary | ICD-10-CM

## 2019-12-22 NOTE — Progress Notes (Signed)
Established Patient Office Visit  Subjective:  Patient ID: Joshua Roy, male    DOB: Dec 11, 1950  Age: 69 y.o. MRN: [id number]  CC:  Chief Complaint  Patient presents with  . Hospitalization Follow-up    HPI  Joshua Roy presents for follow-up from the hospital.stable at the present time.  Patient has a bypass surgery in the hospital.  he also has intermittent uncontrolled diabetes hyperlipidemia  he is a smoker unfortunately continues to smoke.   He has a bypass surgery done on March 20 1A.  Postoperatively postoperatively he was treated for atrial fibrillation.   He has been recently admitted in the hospital.  With congestive heart failure and lost about 30 pounds.   Past Medical History:  Diagnosis Date  . (HFpEF) heart failure with preserved ejection fraction ([place])    a. 07/2019 Echo: EF 60-65%. Mod LVH. Gr1 DD. No rwma. Nl RV size/fxn. Mildly dil LA.  Marland Kitchen ACE-inhibitor cough   . Anxiety   . Bronchitis 06/27/2016  . Carotid arterial disease ([place])    a. 09/2019 Carotid U/S: RICA 54-27%, LICA 0-62%; b. 09/7626 R CEA.  . CKD (chronic kidney disease), stage III   . COPD (chronic obstructive pulmonary disease) (HCC)    cath, mild inf. hypokinesis EF 49%, 100% ROA  . Coronary artery disease    a. 2001 Cath: LM 10, LAD 60p/m, D1 30, LCX 20p, RCA 1105m/d-->Med Rx; b. 05/2017 Ex Mv: Ex time 5:46, antlat twi @ rest, fixed infarct w/ mild peri-infarct ischemia. EF 38%; c. 08/2019 Cath: LM 40ost, 70d, LAD 66m, LCX 33m, RCA 80-90p, 153m; d. 09/2019 CABG x4: LIMA->LAD, VG->RI, VG->OM, VG->RPDA.  . Diabetes mellitus type II   . Hyperlipidemia   . Hypertension   . MI (myocardial infarction) ([place]) age 1  . OA (osteoarthritis)    knee OA, injected 2012 by ortho  . PAF (paroxysmal atrial fibrillation) ([place])    a. 09/2019 post-op CABG-->Amio.  . Pneumonia   . PSVT (paroxysmal supraventricular tachycardia) ([place])    a. 08/2019 Zio: 189 brief runs of SVT (fastest 174 x 6 beats,  longest 15 beats @ 107).  . Tobacco abuse     Past Surgical History:  Procedure Laterality Date  . CARDIAC CATHETERIZATION  05/06/2000   @ [facility]  . COLONOSCOPY WITH PROPOFOL N/A 04/20/2018   Procedure: COLONOSCOPY WITH PROPOFOL;  Surgeon: [name], MD;  Location: [facility] ENDOSCOPY;  Service: Endoscopy;  Laterality: N/A;  . CORONARY ARTERY BYPASS GRAFT N/A 10/07/2019   Procedure: CORONARY ARTERY BYPASS GRAFTING (CABG) using LIMA to LAD; Endoscopic harvesting of left greater saphenous vein: SVG to RAMUS; SVG to OM1; SVG to PDA.;  Surgeon: Gaye Pollack, MD;  Location: [place];  Service: Open Heart Surgery;  Laterality: N/A;  . ENDARTERECTOMY Right 10/07/2019   Procedure: ENDARTERECTOMY CAROTID;  Surgeon: Rosetta Posner, MD;  Location: [facility] OR;  Service: Vascular;  Laterality: Right;  . ENDOVEIN HARVEST OF GREATER SAPHENOUS VEIN Left 10/07/2019   Procedure: [place] Of Greater Saphenous Vein;  Surgeon: Gaye Pollack, MD;  Location: [facility] OR;  Service: Open Heart Surgery;  Laterality: Left;  . ESOPHAGOGASTRODUODENOSCOPY ENDOSCOPY    . KNEE ARTHROSCOPY  07/25/04   left  . [place]   right, open surgery- repair  . RIGHT/LEFT HEART CATH AND CORONARY ANGIOGRAPHY Bilateral 09/08/2019   Procedure: RIGHT/LEFT HEART CATH AND CORONARY ANGIOGRAPHY;  Surgeon: Minna Merritts, MD;  Location: [place] CV LAB;  Service: Cardiovascular;  Laterality: Bilateral;  . TEE WITHOUT CARDIOVERSION N/A 10/07/2019   Procedure: TRANSESOPHAGEAL ECHOCARDIOGRAM (TEE);  Surgeon: Gaye Pollack, MD;  Location: [place];  Service: Open Heart Surgery;  Laterality: N/A;  . 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 TOTAL KNEE ARTHROPLASTY Right 07/13/2017   Procedure: RIGHT TOTAL KNEE ARTHROPLASTY;  Surgeon: Gaynelle Arabian, MD;  Location: [facility] ORS;  Service: Orthopedics;  Laterality:  Right;    Family History  Problem Relation Age of Onset  . Heart disease Father        CAD, MI x 3  . Hypertension Father   . Alcohol abuse Father   . Diabetes Father   . Diabetes Sister   . Cancer Brother        lymphoma, in remission  . Colon cancer Brother   . Heart disease Sister        CABG x 3  . Prostate cancer Neg Hx     Social History   Socioeconomic History  . Marital status: Married    Spouse name: Not on file  . Number of children: 3  . Years of education: Not on file  . Highest education level: Not on file  Occupational History  . Occupation: Engineer, manufacturing, Estate agent  Tobacco Use  . Smoking status: Former Smoker    Packs/day: 1.00    Years: 40.00    Pack years: 40.00    Quit date: 10/07/2019    Years since quitting: 0.2  . Smokeless tobacco: Never Used  . Tobacco comment: per patient wears a patch and has cut down on cigarettes/day (10/05/2019)  Substance and Sexual Activity  . Alcohol use: Yes    Alcohol/week: 0.0 standard drinks    Comment: 5-6 cocktails, 1 times a week  . Drug use: No  . Sexual activity: Not on file  Other Topics Concern  . Not on file  Social History Narrative   From [place].  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   Social Determinants of Health   Financial Resource Strain:   . Difficulty of Paying Living Expenses:   Food Insecurity:   . Worried About Charity fundraiser in the Last Year:   . Arboriculturist in the Last Year:   Transportation Needs:   . Film/video editor (Medical):   Marland Kitchen Lack of Transportation (Non-Medical):   Physical Activity:   . Days of Exercise per Week:   . Minutes of Exercise per Session:   Stress:   . Feeling of Stress :   Social Connections:   . Frequency of Communication with Friends and Family:   . Frequency of Social Gatherings with Friends and Family:   . Attends Religious Services:   . Active Member of Clubs or Organizations:   . Attends Archivist Meetings:     Marland Kitchen Marital Status:   Intimate Partner Violence:   . Fear of Current or Ex-Partner:   . Emotionally Abused:   Marland Kitchen Physically Abused:   . Sexually Abused:      Current Outpatient Medications:  .  acetaminophen (TYLENOL) 500 MG tablet, Take 1,000 mg by mouth every 8 (eight) hours as needed for mild pain or headache., Disp: , Rfl:  .  ALPRAZolam (XANAX) 0.25 MG tablet, Take 0.25 mg by mouth 2 (two) times daily as needed for anxiety. , Disp: , Rfl:  .  amLODipine (NORVASC) 10 MG tablet, Take 1 tablet (10 mg total) by mouth daily., Disp: 90 tablet, Rfl: 3 .  aspirin EC 81 MG tablet, Take 1 tablet (81 mg total) by mouth daily., Disp: , Rfl:  .  atorvastatin (LIPITOR) 80 MG tablet, Take 1 tablet (80 mg total) by mouth daily., Disp: 90 tablet, Rfl: 3 .  clopidogrel (PLAVIX) 75 MG tablet, Take 75 mg by mouth daily., Disp: , Rfl:  .  fenofibrate 160 MG tablet, Take 1 tablet (160 mg total) by mouth daily., Disp: 90 tablet, Rfl: 1 .  furosemide (LASIX) 40 MG tablet, Take 1 tablet (40 mg total) by mouth 2 (two) times daily., Disp: 60 tablet, Rfl: 1 .  glimepiride (AMARYL) 2 MG tablet, Take 2 mg by mouth daily with breakfast., Disp: , Rfl:  .  metFORMIN (GLUCOPHAGE) 1000 MG tablet, Take 1 tablet (1,000 mg total) by mouth 2 (two) times daily., Disp: 180 tablet, Rfl: 1 .  metoprolol tartrate (LOPRESSOR) 25 MG tablet, Take 1 tablet (25 mg total) by mouth 2 (two) times daily., Disp: 180 tablet, Rfl: 1 .  nicotine (NICODERM CQ - DOSED IN MG/24 HOURS) 21 mg/24hr patch, Place 21 mg onto the skin daily as needed (nicotine dependence). , Disp: , Rfl:  .  nitroGLYCERIN (NITROSTAT) 0.4 MG SL tablet, Place 0.4 mg under the tongue every 5 (five) minutes as needed for chest pain., Disp: , Rfl:  .  pantoprazole (PROTONIX) 40 MG tablet, Take 40 mg by mouth daily before breakfast., Disp: , Rfl:  .  spironolactone (ALDACTONE) 25 MG tablet, Take 1 tablet (25 mg total) by mouth daily., Disp: 90 tablet, Rfl: 3 .  traZODone  (DESYREL) 50 MG tablet, Take 50 mg by mouth at bedtime as needed for sleep., Disp: , Rfl:    Allergies  Allergen Reactions  . Ramipril Cough  . Mucinex D [Pseudoephedrine-Guaifenesin Er] Other (See Comments) and Hypertension    Elevated BP, insomnia    ROS Review of Systems  Constitutional: Negative for appetite change.  HENT: Negative for congestion.   Eyes: Negative for pain.  Respiratory: Positive for shortness of breath (on exertion). Negative for cough, chest tightness and wheezing.   Cardiovascular: Positive for leg swelling.  Genitourinary: Negative for dysuria.  Musculoskeletal: Negative for back pain.  Neurological: Negative for dizziness.  Psychiatric/Behavioral: Negative for dysphoric mood.      Objective:    Physical Exam  Constitutional: He appears well-developed and well-nourished.  HENT:  Head: Normocephalic and atraumatic.  Eyes: Pupils are equal, round, and reactive to light.  Neck: No JVD present. No tracheal deviation present. No thyromegaly present.  Pulmonary/Chest: No respiratory distress. He has no wheezes.  Abdominal: There is no abdominal tenderness.  Musculoskeletal:        General: No edema.     Cervical back: Normal range of motion.  Lymphadenopathy:    He has no cervical adenopathy.  Neurological: No cranial nerve deficit.  Psychiatric: His behavior is normal.    BP (!) 98/52   Pulse 77   Wt 237 lb 8 oz (107.7 kg)   SpO2 (!) 89%   BMI 34.08 kg/m  Wt Readings from Last 3 Encounters:  12/22/19 237 lb 8 oz (107.7 kg)  12/20/19 233 lb (105.7 kg)  12/20/19 229 lb (103.9 kg)     Health Maintenance Due  Topic Date Due  . Hepatitis C Screening  Never done  . COVID-19 Vaccine (1) Never done  . OPHTHALMOLOGY EXAM  05/29/2015  .  FOOT EXAM  11/21/2015  . PNA vac Low Risk Adult (2 of 2 - PPSV23) 07/06/2018    There are no preventive care reminders to display for this patient.  Lab Results  Component Value Date   TSH 1.65 11/21/2009     Lab Results  Component Value Date   WBC 8.6 12/14/2019   HGB 11.3 (L) 12/14/2019   HCT 38.3 (L) 12/14/2019   MCV 73.9 (L) 12/14/2019   PLT 293 12/14/2019   Lab Results  Component Value Date   NA 137 12/14/2019   K 3.9 12/14/2019   CO2 36 (H) 12/14/2019   GLUCOSE 155 (H) 12/14/2019   BUN 51 (H) 12/14/2019   CREATININE 1.74 (H) 12/14/2019   BILITOT 1.0 12/07/2019   ALKPHOS 25 (L) 12/07/2019   AST 19 12/07/2019   ALT 22 12/07/2019   PROT 7.2 12/07/2019   ALBUMIN 3.8 12/07/2019   CALCIUM 9.2 12/14/2019   ANIONGAP 10 12/14/2019   GFR 65.78 11/15/2014   Lab Results  Component Value Date   CHOL 84 10/11/2019   Lab Results  Component Value Date   HDL 26 (L) 10/11/2019   Lab Results  Component Value Date   LDLCALC 39 10/11/2019   Lab Results  Component Value Date   TRIG 93 10/11/2019   Lab Results  Component Value Date   CHOLHDL 3.2 10/11/2019   Lab Results  Component Value Date   HGBA1C 7.4 (H) 12/07/2019      Assessment & Plan:   Problem List Items Addressed This Visit      Cardiovascular and Mediastinum   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      Morbid obesity ([place])   S/P CABG x 4 - Primary      No orders of the defined types were placed in this encounter.  1. S/P CABG x 4 Patient denies any chest pain.  2. Morbid obesity ([place]) Patient has been following low-salt diet and trying to lose weight.  I told him to join the rehab program in the hospital.  3. Seasonal allergic rhinitis due to pollen Chronic problem.  4. Acute on chronic heart failure with preserved ejection fraction (HFpEF) ([place]) Need to follow strict low-salt diet and  stop smoking.  5. Coronary artery disease of native artery of native heart with stable angina pectoris (HCC) No chest pain.  6. Essential hypertension Stable.  7. 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 Follow-up: Return in about 2 weeks (around 01/05/2020).    [name], MD

## 2019-12-22 NOTE — Assessment & Plan Note (Signed)
Encouraged to stop smoking

## 2019-12-23 ENCOUNTER — Encounter: Payer: Self-pay | Admitting: *Deleted

## 2019-12-23 ENCOUNTER — Other Ambulatory Visit: Payer: Self-pay | Admitting: *Deleted

## 2019-12-23 NOTE — Patient Outreach (Addendum)
[place] [facility]) Care Management  12/23/2019  Joshua Roy 07-Sep-1950 [id number]  EMMI-general discharge D/c from [facility] [place] # 1 Date: Friday 12/16/19 1302 Red Alert Reason: Unfilled prescriptions? 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 Patient gave verbal consent for services [facility] telephonic RN CM.   Advised patient that there will be further automated EMMI- post discharge calls to assess how the patient is doing following the recent hospitalization Advised the patient that another call may be received from a nurse if any of their responses were abnormal. Patient voiced understanding and was appreciative of f/u call.   Plan: [facility] RN CM will follow up with Joshua Roy within the next 18 -16 business days Pt encouraged to return a call to [facility] RN CM prn [facility] RN CM sent a G[employer]er with [employer] brochure, [employer], [facility] consent form with return envelope and know before you go sheet enclosed for review  Routed note to MD MD involvement barriers letter sent   [facility] CM Care Plan Problem One     Most Recent Value  Care Plan Problem One knowledge deficit of home care for CAD s/p CABG  Role Documenting the Problem One Care Management Telephonic  Coordinator  Care Plan for Problem One Active  [facility] Long Term Goal  over the next 90 days patient will be able to verbalize with outreach interventions to manage DM, COPD, HTN and CHFas verbalized during follow up outreach  [facility] Long Term Goal Start Date 12/23/19  Interventions for Problem One Long Term Goal EMMI outreach completed to review medicine concerns  [facility] CM Short Term Goal #1  over the next 30 days the patient will veralize 3 signs & symptoms to report to MD for DM, CHF, COPD and HTN  [facility] CM Short Term Goal #1 Start Date 12/23/19  Interventions for Short Term Goal #1 over the next 90 days patient will be able to verbalize with outreach interventions to manage chf, a       Joshua Dilauro L. Lavina Hamman, RN, BSN, [place] Coordinator Office number [phone] Mobile number [phone]  Main [facility] number [phone] Fax number [phone]

## 2019-12-27 DIAGNOSIS — G4733 Obstructive sleep apnea (adult) (pediatric): Secondary | ICD-10-CM | POA: Diagnosis not present

## 2020-01-03 DIAGNOSIS — G4733 Obstructive sleep apnea (adult) (pediatric): Secondary | ICD-10-CM | POA: Diagnosis not present

## 2020-01-06 ENCOUNTER — Other Ambulatory Visit: Payer: Self-pay

## 2020-01-06 ENCOUNTER — Ambulatory Visit (INDEPENDENT_AMBULATORY_CARE_PROVIDER_SITE_OTHER): Payer: Medicare Other | Admitting: Internal Medicine

## 2020-01-06 ENCOUNTER — Encounter: Payer: Self-pay | Admitting: Internal Medicine

## 2020-01-06 VITALS — BP 127/61 | HR 84 | Ht 66.0 in | Wt 235.1 lb

## 2020-01-06 DIAGNOSIS — I2510 Atherosclerotic heart disease of native coronary artery without angina pectoris: Secondary | ICD-10-CM

## 2020-01-06 DIAGNOSIS — G4733 Obstructive sleep apnea (adult) (pediatric): Secondary | ICD-10-CM

## 2020-01-06 DIAGNOSIS — I10 Essential (primary) hypertension: Secondary | ICD-10-CM | POA: Diagnosis not present

## 2020-01-06 DIAGNOSIS — J449 Chronic obstructive pulmonary disease, unspecified: Secondary | ICD-10-CM

## 2020-01-06 DIAGNOSIS — E1169 Type 2 diabetes mellitus with other specified complication: Secondary | ICD-10-CM | POA: Diagnosis not present

## 2020-01-06 DIAGNOSIS — N521 Erectile dysfunction due to diseases classified elsewhere: Secondary | ICD-10-CM

## 2020-01-06 NOTE — Assessment & Plan Note (Signed)
Patient takes sildenafil 20 mg as needed 5 times months

## 2020-01-06 NOTE — Assessment & Plan Note (Signed)
Patient has stopped smoking 

## 2020-01-06 NOTE — Assessment & Plan Note (Signed)
pressure is under control

## 2020-01-06 NOTE — Progress Notes (Addendum)
Established Patient Office Visit  SUBJECTIVE:  Patient ID: Joshua Roy, male    DOB: 1951-04-30  Age: 69 y.o. MRN: [id number]  CC:  Chief Complaint  Patient presents with  . 2 Week Follow-up    HPI Joshua Roy presents for his two week follow up.   He was recently seen in the hospital from 12/07/2019 to 12/12/2019 for his coronary artery disease status post recent four -vessel CABG, carotid disease status post recent carotid endarterectomy. He has a history of A fib, diabetes, hypertension, and chronic kidney disease Stage IIIb. He will be participating in cardiac rehabilitation.   He sleeps with Oxygen at home, usually 3L; he has been on this since he left the hospital. His recent sleep study resulted showing severe obstructive sleep apnea. He averaged 84% during wakefulness and 78% during sleep. He was recommended for his BIPAP to be set at 10/4cm.   He stopped taking his omeprazole. He would like to see how he does with diet control alone.   He is down two lbs since his last visit.   Past Medical History:  Diagnosis Date  . (HFpEF) heart failure with preserved ejection fraction ([place])    a. 07/2019 Echo: EF 60-65%. Mod LVH. Gr1 DD. No rwma. Nl RV size/fxn. Mildly dil LA.  Marland Kitchen ACE-inhibitor cough   . Anxiety   . Bronchitis 06/27/2016  . Carotid arterial disease ([place])    a. 09/2019 Carotid U/S: RICA 27-03%, LICA 5-00%; b. 03/3817 R CEA.  . CKD (chronic kidney disease), stage III   . COPD (chronic obstructive pulmonary disease) (HCC)    cath, mild inf. hypokinesis EF 49%, 100% ROA  . Coronary artery disease    a. 2001 Cath: LM 10, LAD 60p/m, D1 30, LCX 20p, RCA 153m/d-->Med Rx; b. 05/2017 Ex Mv: Ex time 5:46, antlat twi @ rest, fixed infarct w/ mild peri-infarct ischemia. EF 38%; c. 08/2019 Cath: LM 40ost, 70d, LAD 68m, LCX 71m, RCA 80-90p, 185m; d. 09/2019 CABG x4: LIMA->LAD, VG->RI, VG->OM, VG->RPDA.  . Diabetes mellitus type II   . Hyperlipidemia   . Hypertension    . MI (myocardial infarction) ([place]) age 66  . OA (osteoarthritis)    knee OA, injected 2012 by ortho  . PAF (paroxysmal atrial fibrillation) ([place])    a. 09/2019 post-op CABG-->Amio.  . Pneumonia   . PSVT (paroxysmal supraventricular tachycardia) ([place])    a. 08/2019 Zio: 189 brief runs of SVT (fastest 174 x 6 beats, longest 15 beats @ 107).  . Tobacco abuse     Past Surgical History:  Procedure Laterality Date  . CARDIAC CATHETERIZATION  05/06/2000   @ [facility]  . COLONOSCOPY WITH PROPOFOL N/A 04/20/2018   Procedure: COLONOSCOPY WITH PROPOFOL;  Surgeon: [name], MD;  Location: [facility] ENDOSCOPY;  Service: Endoscopy;  Laterality: N/A;  . CORONARY ARTERY BYPASS GRAFT N/A 10/07/2019   Procedure: CORONARY ARTERY BYPASS GRAFTING (CABG) using LIMA to LAD; Endoscopic harvesting of left greater saphenous vein: SVG to RAMUS; SVG to OM1; SVG to PDA.;  Surgeon: Gaye Pollack, MD;  Location: [facility];  Service: Open Heart Surgery;  Laterality: N/A;  . ENDARTERECTOMY Right 10/07/2019   Procedure: ENDARTERECTOMY CAROTID;  Surgeon: Rosetta Posner, MD;  Location: [facility] OR;  Service: Vascular;  Laterality: Right;  . ENDOVEIN HARVEST OF GREATER SAPHENOUS VEIN Left 10/07/2019   Procedure: [place] Of Greater Saphenous Vein;  Surgeon: Gaye Pollack, MD;  Location[facility] OR;  Service: Open Heart Surgery;  Laterality:  Left;  . ESOPHAGOGASTRODUODENOSCOPY ENDOSCOPY    . KNEE ARTHROSCOPY  07/25/04   left  . [place]   right, open surgery- repair  . RIGHT/LEFT HEART CATH AND CORONARY ANGIOGRAPHY Bilateral 09/08/2019   Procedure: RIGHT/LEFT HEART CATH AND CORONARY ANGIOGRAPHY;  Surgeon: Minna Merritts, MD;  Location: [place] CV LAB;  Service: Cardiovascular;  Laterality: Bilateral;  . TEE WITHOUT CARDIOVERSION N/A 10/07/2019   Procedure: TRANSESOPHAGEAL ECHOCARDIOGRAM (TEE);  Surgeon: Gaye Pollack, MD;  Location: [place];  Service: Open Heart Surgery;  Laterality: N/A;  . TOTAL KNEE ARTHROPLASTY  Left 07/14/2016   Procedure: LEFT TOTAL KNEE ARTHROPLASTY;  Surgeon: Gaynelle Arabian, MD;  Location: [facility] ORS;  Service: Orthopedics;  Laterality: Left;  . TOTAL KNEE ARTHROPLASTY Right 07/13/2017   Procedure: RIGHT TOTAL KNEE ARTHROPLASTY;  Surgeon: Gaynelle Arabian, MD;  Location: [facility] ORS;  Service: Orthopedics;  Laterality: Right;    Family History  Problem Relation Age of Onset  . Heart disease Father        CAD, MI x 3  . Hypertension Father   . Alcohol abuse Father   . Diabetes Father   . Diabetes Sister   . Cancer Brother        lymphoma, in remission  . Colon cancer Brother   . Heart disease Sister        CABG x 3  . Prostate cancer Neg Hx     Social History   Socioeconomic History  . Marital status: Married    Spouse name: [name]  . Number of children: 3  . Years of education: Not on file  . Highest education level: Not on file  Occupational History  . Occupation: Engineer, manufacturing, Estate agent  Tobacco Use  . Smoking status: Former Smoker    Packs/day: 1.00    Years: 40.00    Pack years: 40.00    Quit date: 10/07/2019    Years since quitting: 0.2  . Smokeless tobacco: Never Used  . Tobacco comment: per patient wears a patch and has cut down on cigarettes/day (10/05/2019)  Vaping Use  . Vaping Use: Never used  Substance and Sexual Activity  . Alcohol use: Yes    Alcohol/week: 0.0 standard drinks    Comment: 5-6 cocktails, 1 times a week  . Drug use: No  . Sexual activity: Not on file  Other Topics Concern  . Not on file  Social History Narrative   From [place].  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   Social Determinants of Health   Financial Resource Strain:   . Difficulty of Paying Living Expenses:   Food Insecurity:   . Worried About Charity fundraiser in the Last Year:   . Arboriculturist in the Last Year:   Transportation Needs: No Transportation Needs  . Lack of Transportation (Medical): No  . Lack of Transportation (Non-Medical): No    Physical Activity:   . Days of Exercise per Week:   . Minutes of Exercise per Session:   Stress:   . Feeling of Stress :   Social Connections:   . Frequency of Communication with Friends and Family:   . Frequency of Social Gatherings with Friends and Family:   . Attends Religious Services:   . Active Member of Clubs or Organizations:   . Attends Archivist Meetings:   Marland Kitchen Marital Status:   Intimate Partner Violence:   . Fear of Current or Ex-Partner:   .  Emotionally Abused:   Marland Kitchen Physically Abused:   . Sexually Abused:      Current Outpatient Medications:  .  acetaminophen (TYLENOL) 500 MG tablet, Take 1,000 mg by mouth every 8 (eight) hours as needed for mild pain or headache., Disp: , Rfl:  .  amLODipine (NORVASC) 10 MG tablet, Take 1 tablet (10 mg total) by mouth daily., Disp: 90 tablet, Rfl: 3 .  aspirin EC 81 MG tablet, Take 1 tablet (81 mg total) by mouth daily., Disp: , Rfl:  .  atorvastatin (LIPITOR) 80 MG tablet, Take 1 tablet (80 mg total) by mouth daily., Disp: 90 tablet, Rfl: 3 .  clopidogrel (PLAVIX) 75 MG tablet, Take 75 mg by mouth daily., Disp: , Rfl:  .  fenofibrate 160 MG tablet, Take 1 tablet (160 mg total) by mouth daily., Disp: 90 tablet, Rfl: 1 .  glimepiride (AMARYL) 2 MG tablet, Take 2 mg by mouth daily with breakfast., Disp: , Rfl:  .  metFORMIN (GLUCOPHAGE) 1000 MG tablet, Take 1 tablet (1,000 mg total) by mouth 2 (two) times daily., Disp: 180 tablet, Rfl: 1 .  metoprolol tartrate (LOPRESSOR) 25 MG tablet, Take 1 tablet (25 mg total) by mouth 2 (two) times daily., Disp: 180 tablet, Rfl: 1 .  nicotine (NICODERM CQ - DOSED IN MG/24 HOURS) 21 mg/24hr patch, Place 21 mg onto the skin daily as needed (nicotine dependence). , Disp: , Rfl:  .  nitroGLYCERIN (NITROSTAT) 0.4 MG SL tablet, Place 0.4 mg under the tongue every 5 (five) minutes as needed for chest pain., Disp: , Rfl:  .  pantoprazole (PROTONIX) 40 MG tablet, Take 40 mg by mouth daily before  breakfast., Disp: , Rfl:  .  spironolactone (ALDACTONE) 25 MG tablet, Take 1 tablet (25 mg total) by mouth daily., Disp: 90 tablet, Rfl: 3 .  traZODone (DESYREL) 50 MG tablet, Take 50 mg by mouth at bedtime as needed for sleep., Disp: , Rfl:  .  furosemide (LASIX) 40 MG tablet, Take 1 tablet (40 mg total) by mouth 2 (two) times daily., Disp: 60 tablet, Rfl: 1 .  glucose blood (TRUE METRIX BLOOD GLUCOSE TEST) test strip, Use as instructed, Disp: 100 each, Rfl: 12   Allergies  Allergen Reactions  . Ramipril Cough  . Mucinex D [Pseudoephedrine-Guaifenesin Er] Other (See Comments) and Hypertension    Elevated BP, insomnia    ROS Review of Systems  Constitutional: Negative.   HENT: Negative.   Eyes: Negative.   Respiratory: Positive for cough and shortness of breath.   Cardiovascular: Negative.  Negative for chest pain.  Gastrointestinal: Negative.   Endocrine: Negative.   Genitourinary: Negative.   Musculoskeletal: Negative.   Skin: Negative.   Allergic/Immunologic: Negative.   Neurological: Negative.   Hematological: Negative.   Psychiatric/Behavioral: Negative.   All other systems reviewed and are negative.     OBJECTIVE:    Physical Exam Vitals reviewed.  Constitutional:      Appearance: Normal appearance.  Cardiovascular:     Rate and Rhythm: Normal rate and regular rhythm.     Pulses: Normal pulses.     Heart sounds: Normal heart sounds.  Abdominal:     Tenderness: There is no abdominal tenderness.  Musculoskeletal:     Right lower leg: No edema.     Left lower leg: No edema.  Skin:    Findings: Bruising (L leg from cardiac cath) present.       Neurological:     Mental Status: He is alert and oriented  to person, place, and time.  Psychiatric:        Mood and Affect: Mood normal.        Behavior: Behavior normal.     BP 127/61   Pulse 84   Ht 5\' 6"  (1.676 m)   Wt 235 lb 1.6 oz (106.6 kg)   SpO2 97%   BMI 37.95 kg/m  Wt Readings from Last 3  Encounters:  01/06/20 235 lb 1.6 oz (106.6 kg)  12/22/19 237 lb 8 oz (107.7 kg)  12/20/19 233 lb (105.7 kg)    Health Maintenance Due  Topic Date Due  . Hepatitis C Screening  Never done  . COVID-19 Vaccine (1) Never done  . OPHTHALMOLOGY EXAM  05/29/2015  . FOOT EXAM  11/21/2015  . PNA vac Low Risk Adult (2 of 2 - PPSV23) 07/06/2018    There are no preventive care reminders to display for this patient.  CBC Latest Ref Rng & Units 12/14/2019 12/13/2019 12/11/2019  WBC 4.0 - 10.5 K/uL 8.6 8.3 5.6  Hemoglobin 13.0 - 17.0 g/dL 11.3(L) 10.8(L) 9.8(L)  Hematocrit 39 - 52 % 38.3(L) 38.1(L) 34.3(L)  Platelets 150 - 400 K/uL 293 282 278   CMP Latest Ref Rng & Units 12/14/2019 12/13/2019 12/12/2019  Glucose 70 - 99 mg/dL 155(H) 307(H) 180(H)  BUN 8 - 23 mg/dL 51(H) 45(H) 41(H)  Creatinine 0.61 - 1.24 mg/dL 1.74(H) 1.79(H) 1.72(H)  Sodium 135 - 145 mmol/L 137 135 135  Potassium 3.5 - 5.1 mmol/L 3.9 4.6 4.4  Chloride 98 - 111 mmol/L 91(L) 91(L) 92(L)  CO2 22 - 32 mmol/L 36(H) 36(H) 34(H)  Calcium 8.9 - 10.3 mg/dL 9.2 9.2 9.0  Total Protein 6.5 - 8.1 g/dL - - -  Total Bilirubin 0.3 - 1.2 mg/dL - - -  Alkaline Phos 38 - 126 U/L - - -  AST 15 - 41 U/L - - -  ALT 0 - 44 U/L - - -    Lab Results  Component Value Date   TSH 1.65 11/21/2009   Lab Results  Component Value Date   ALBUMIN 3.8 12/07/2019   ANIONGAP 10 12/14/2019   GFR 65.78 11/15/2014   Lab Results  Component Value Date   CHOL 84 10/11/2019   CHOL 155 11/15/2014   CHOL 168 09/26/2013   HDL 26 (L) 10/11/2019   HDL 35.00 (L) 11/15/2014   HDL 31 (L) 09/26/2013   LDLCALC 39 10/11/2019   LDLCALC 93 09/26/2013   LDLCALC 44 05/14/2013   CHOLHDL 3.2 10/11/2019   CHOLHDL 4 11/15/2014   CHOLHDL 5.4 09/26/2013   Lab Results  Component Value Date   TRIG 93 10/11/2019   Lab Results  Component Value Date   HGBA1C 7.4 (H) 12/07/2019   HGBA1C 8.2 (H) 10/05/2019   HGBA1C 7.4 (H) 07/08/2017      ASSESSMENT & PLAN:    Problem List Items Addressed This Visit      Cardiovascular and Mediastinum   Essential hypertension     pressure is under control       CAD (coronary artery disease)    Patient had a bypass surgery he is not having any chest pain or angina        Respiratory   COPD (chronic obstructive pulmonary disease) ([place])    Patient has stopped smoking        Endocrine   Erectile dysfunction associated with type 2 diabetes mellitus ([place])    Patient takes sildenafil 20 mg as needed 5 times  months        Other   Morbid obesity ([place])    advised to lose weight.       Other Visit Diagnoses    Obstructive sleep apnea syndrome    -  Primary      No orders of the defined types were placed in this encounter.  1. Obstructive sleep apnea syndrome She is going to use the CPAP  2. Erectile dysfunction associated with type 2 diabetes mellitus ([place]) ch problem due to diabetes  3. Essential hypertension Under control  4. Morbid obesity ([place]) Go to cardiac rehab  5. Coronary artery disease of native artery of native heart without angina pectoris [facility]) He is not having any angina  6. Chronic obstructive pulmonary disease, unspecified COPD type ([place]) Stable at the present time  At the present time Follow-up: Return in about 4 weeks (around 02/03/2020).    Dr. Jane Canary [facility] [address], [place],  [address]   By signing my name below, I, [employer], attest that this documentation has been prepared under the direction and in the presence of [name], MD. Electronically Signed: [name], MD 01/16/20, 5:20 PM   I personally performed the services described in this documentation, which was SCRIBED in my presence. The recorded information has been reviewed and considered accurate. It has been edited as necessary during review. [name], MD

## 2020-01-06 NOTE — Patient Instructions (Signed)

## 2020-01-06 NOTE — Assessment & Plan Note (Signed)
Patient had a bypass surgery he is not having any chest pain or angina

## 2020-01-06 NOTE — Assessment & Plan Note (Signed)
advised to lose weight.

## 2020-01-09 ENCOUNTER — Other Ambulatory Visit: Payer: Self-pay | Admitting: *Deleted

## 2020-01-09 MED ORDER — FUROSEMIDE 40 MG PO TABS: 40.0000 mg | ORAL_TABLET | Freq: Two times a day (BID) | ORAL | 1 refills | Status: DC

## 2020-01-10 ENCOUNTER — Telehealth: Payer: Self-pay | Admitting: Cardiovascular Disease

## 2020-01-10 NOTE — Telephone Encounter (Signed)
Patient having dental cleaning and wants to know if he needs pre medication as preventative .

## 2020-01-10 NOTE — Telephone Encounter (Signed)
Spoke with patient and reviewed if he has previously taken medication for this and he denied. Also inquired about if he has had any valve replacement or artifical valves and he denied as well. Reviewed that he does not need one for general cleaning. He verbalized understanding with no further questions at this time.

## 2020-01-11 ENCOUNTER — Other Ambulatory Visit: Payer: Self-pay

## 2020-01-11 MED ORDER — FUROSEMIDE 40 MG PO TABS: 40.0000 mg | ORAL_TABLET | Freq: Two times a day (BID) | ORAL | 1 refills | Status: DC

## 2020-01-11 MED ORDER — TRUE METRIX BLOOD GLUCOSE TEST VI STRP: ORAL_STRIP | 12 refills | Status: DC

## 2020-01-16 ENCOUNTER — Telehealth: Payer: Self-pay

## 2020-01-16 NOTE — Telephone Encounter (Signed)
RN called patients home, no one answered phone.  RN left message requesting call back to schedule palliative care visit.

## 2020-01-16 NOTE — Telephone Encounter (Signed)
Telephone call from patient returning RN's call.  Patient reports he is doing well and has been working on his property.  Patient wants to be discharged from palliative care services.  RN will update Dr Allen Norris office and palliative care admin. Team.  Patient verbalized appreciation for call and services.

## 2020-01-17 ENCOUNTER — Other Ambulatory Visit: Payer: Self-pay | Admitting: *Deleted

## 2020-01-17 MED ORDER — TRUE METRIX BLOOD GLUCOSE TEST VI STRP: ORAL_STRIP | 12 refills | Status: DC

## 2020-01-23 ENCOUNTER — Ambulatory Visit: Payer: Self-pay | Admitting: *Deleted

## 2020-01-24 ENCOUNTER — Other Ambulatory Visit: Payer: Self-pay

## 2020-01-24 ENCOUNTER — Other Ambulatory Visit: Payer: Self-pay | Admitting: *Deleted

## 2020-01-24 MED ORDER — TRUE METRIX BLOOD GLUCOSE TEST VI STRP: 1.0000 | ORAL_STRIP | Freq: Every day | 12 refills | Status: AC

## 2020-01-24 NOTE — Patient Outreach (Addendum)
[facility]) Care Management  01/24/2020  Hoang Pettingill Overall 04-07-1951 [id number]   [facility] follow outreach to EMMI-general discharge referred patient  D/c from [facility]  Joshua Roy was referred to [facility] on 12/19/19 for RED ON EMMI ALERT Day # 1 Date: Friday 12/16/19 1302 Red Alert Reason: Unfilled prescriptions? Yes This EMMI issue was resolved on 12/23/19 as he confirmed he had filled all his medications and reported error with the EMMI red alert documentation   Insurance: [employer] Medicare and blue cross and blue shield supplement  [facility] admissions x 2 ED visits x 2 in the last 6 months     Outreach attempt successful for follow up and proceeding with complex care services Patient is able to verify HIPAA, DOB and address [place] Management RN reviewed the reason for the follow up outreach as agreed with patient   He denies worsing changes in his DM, HTN CHF, COPD conditions   Social Joshua IVEN EARNHART is a 69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, nutritional, 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.he has support of his wife Etheleen Mayhew with 3 children He denies issues with his care needs or transportation to medical appointments Wife assists with transportation  Conditions: uncontrolled type 2 diabetes, with circulatory disorder causing erectile dysfunction, essential hypertension (HTN), coronary artery disease (CAD), unstable angina, acute on chronic heart failure (CHF) with preserved ejection fraction (EF), Chronic obstructive pulmonary disease (COPD), Hyperlipidemia (HLD), seasonal allergic rhinitis Gastric ulcer, 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 Patient gave verbal consent for services [facility] telephonic RN CM.   Plan: [facility] RN CM will follow up with Joshua Aoun within the next 30-35 business days Pt encouraged to return a call to [facility] RN CM prn  [facility] CM Care Plan Problem One     Most Recent Value  Care Plan Problem One knowledge deficit of home care for CAD s/p CABG  Role Documenting the Problem One Care Management Telephonic Coordinator  Care Plan for Problem One Active  [facility] Long Term Goal  over the next 90 days patient will be able to verbalize with outreach interventions to manage DM, COPD, HTN and CHFas verbalized during follow up outreach  [facility] Long Term Goal Start Date 12/23/19  Interventions for Problem One Long Term Goal follow on EMMI and assess for care coordination and diseas management needs   [facility] CM Short Term Goal #1  over the next 30 days the patient will veralize 3 signs & symptoms to report to MD for DM, CHF, COPD and HTN  [facility] CM Short Term Goal #1 Start Date 12/23/19  Interventions for Short Term Goal #1 follow on EMMI and assess for care coordination and diseas management needs       Syd Manges L. Lavina Hamman, RN, BSN, [place] Coordinator Office number [phone] Mobile number [phone]  Main [facility] number [phone] Fax number [phone]

## 2020-01-24 NOTE — Addendum Note (Signed)
Addended by: Lacretia Nicks L on: 01/24/2020 11:14 AM   Modules accepted: Orders

## 2020-01-28 ENCOUNTER — Other Ambulatory Visit: Payer: Self-pay | Admitting: Internal Medicine

## 2020-02-07 ENCOUNTER — Other Ambulatory Visit: Payer: Self-pay | Admitting: Internal Medicine

## 2020-02-10 ENCOUNTER — Encounter: Payer: Self-pay | Admitting: Internal Medicine

## 2020-02-10 ENCOUNTER — Ambulatory Visit (INDEPENDENT_AMBULATORY_CARE_PROVIDER_SITE_OTHER): Payer: Medicare Other | Admitting: Internal Medicine

## 2020-02-10 ENCOUNTER — Other Ambulatory Visit: Payer: Self-pay

## 2020-02-10 VITALS — BP 114/60 | HR 84 | Ht 69.0 in | Wt 234.2 lb

## 2020-02-10 DIAGNOSIS — I2510 Atherosclerotic heart disease of native coronary artery without angina pectoris: Secondary | ICD-10-CM | POA: Diagnosis not present

## 2020-02-10 DIAGNOSIS — J449 Chronic obstructive pulmonary disease, unspecified: Secondary | ICD-10-CM

## 2020-02-10 DIAGNOSIS — I10 Essential (primary) hypertension: Secondary | ICD-10-CM

## 2020-02-10 DIAGNOSIS — F17200 Nicotine dependence, unspecified, uncomplicated: Secondary | ICD-10-CM

## 2020-02-10 DIAGNOSIS — E782 Mixed hyperlipidemia: Secondary | ICD-10-CM | POA: Diagnosis not present

## 2020-02-10 DIAGNOSIS — N189 Chronic kidney disease, unspecified: Secondary | ICD-10-CM

## 2020-02-10 NOTE — Assessment & Plan Note (Signed)
Patient has gained some weight I told him to continue his statin treatment.  He also referred to nephrologist for evaluation of the kidney functions.

## 2020-02-10 NOTE — Assessment & Plan Note (Signed)
BP controlled on present medication blood pressure today was 114/60  Pt to  continue taking his medicine regularly.  He does not have any pedal edema or shortness of breath chest is clear

## 2020-02-10 NOTE — Assessment & Plan Note (Signed)
Patient had a bypass surgery he is angina free.  He also has a right carotid endarterectomy is stable   His incision has healed well.  Heart is regular chest is clear without any rales.

## 2020-02-10 NOTE — Assessment & Plan Note (Signed)
Patient is trying to quit smoking

## 2020-02-10 NOTE — Progress Notes (Addendum)
Established Patient Office Visit  SUBJECTIVE:  Subjective  Patient ID: Joshua Roy, male    DOB: 1950-09-14  Age: 69 y.o. MRN: [id number]  CC:  Chief Complaint  Patient presents with  . lab results    patient wants to discuss labs over the past few months     HPI Joshua Roy is a 69 y.o. male presenting today to discuss his recent lab results.   He has some concerns about his labs and the large number of high and low values. At that time, he was in the hospital for CHF and then underwent a heart bypass. His last labs in [employer], there was some concern about his creatinine and BUN.  Component     Latest Ref Rng & Units 12/07/2019          WBC     4.0 - 10.5 K/uL 9.3  RBC     4.22 - 5.81 MIL/uL 4.86  Hemoglobin     13.0 - 17.0 g/dL 10.6 (L)  HCT     39 - 52 % 37.1 (L)  MCV     80.0 - 100.0 fL 76.3 (L)  MCH     26.0 - 34.0 pg 21.8 (L)  MCHC     30.0 - 36.0 g/dL 28.6 (L)  RDW     11.5 - 15.5 % 19.8 (H)  Platelets     150 - 400 K/uL 322  nRBC     0.0 - 0.2 % 0.5 (H)  Neutrophils     % 76  NEUT#     1.7 - 7.7 K/uL 7.0  Lymphocytes     % 13  Lymphocyte #     0.7 - 4.0 K/uL 1.2  Monocytes Relative     % 10  Monocyte #     0 - 1 K/uL 1.0  Eosinophil     % 0  Eosinophils Absolute     0 - 0 K/uL 0.0  Basophil     % 1  Basophils Absolute     0 - 0 K/uL 0.1  Immature Granulocytes     % 0  Abs Immature Granulocytes     0.00 - 0.07 K/uL 0.04   Component     Latest Ref Rng & Units 12/07/2019          Sodium     135 - 145 mmol/L 136  Potassium     3.5 - 5.1 mmol/L 4.6  Chloride     98 - 111 mmol/L 91 (L)  CO2     22 - 32 mmol/L 34 (H)  Glucose     70 - 99 mg/dL 114 (H)  BUN     8 - 23 mg/dL 38 (H)  Creatinine     0.61 - 1.24 mg/dL 1.92 (H)  Calcium     8.9 - 10.3 mg/dL 9.1  GFR, Est Non African American     >60 mL/min 35 (L)  GFR, Est African American     >60 mL/min 40 (L)  Anion gap     5 - 15 11    Past Medical  History:  Diagnosis Date  . (HFpEF) heart failure with preserved ejection fraction ([place])    a. 07/2019 Echo: EF 60-65%. Mod LVH. Gr1 DD. No rwma. Nl RV size/fxn. Mildly dil LA.  Marland Kitchen ACE-inhibitor cough   . Anxiety   . Bronchitis 06/27/2016  . Carotid arterial disease ([place])    a. 09/2019 Carotid U/S: RICA  35-45%, LICA 6-25%; b. 12/3891 R CEA.  . CKD (chronic kidney disease), stage III   . COPD (chronic obstructive pulmonary disease) ([facility])    cath, mild inf. hypokinesis EF 49%, 100% ROA  . Coronary artery disease    a. 2001 Cath: LM 10, LAD 60p/m, D1 30, LCX 20p, RCA 140m/d-->Med Rx; b. 05/2017 Ex Mv: Ex time 5:46, antlat twi @ rest, fixed infarct w/ mild peri-infarct ischemia. EF 38%; c. 08/2019 Cath: LM 40ost, 70d, LAD 48m, LCX 78m, RCA 80-90p, 159m; d. 09/2019 CABG x4: LIMA->LAD, VG->RI, VG->OM, VG->RPDA.  . Diabetes mellitus type II   . Hyperlipidemia   . Hypertension   . MI (myocardial infarction) ([place]) age 38  . OA (osteoarthritis)    knee OA, injected 2012 by ortho  . PAF (paroxysmal atrial fibrillation) ([place])    a. 09/2019 post-op CABG-->[facility].  . Pneumonia   . PSVT (paroxysmal supraventricular tachycardia) ([place])    a. 08/2019 Zio: 189 brief runs of SVT (fastest 174 x 6 beats, longest 15 beats @ 107).  . Tobacco abuse     Past Surgical History:  Procedure Laterality Date  . CARDIAC CATHETERIZATION  05/06/2000   @ [place]  . COLONOSCOPY WITH PROPOFOL N/A 04/20/2018   Procedure: COLONOSCOPY WITH PROPOFOL;  Surgeon: [name], MD;  Location: [facility] ENDOSCOPY;  Service: Endoscopy;  Laterality: N/A;  . CORONARY ARTERY BYPASS GRAFT N/A 10/07/2019   Procedure: CORONARY ARTERY BYPASS GRAFTING (CABG) using LIMA to LAD; Endoscopic harvesting of left greater saphenous vein: SVG to RAMUS; SVG to OM1; SVG to PDA.;  Surgeon: Gaye Pollack, MD;  Location: [place];  Service: Open Heart Surgery;  Laterality: N/A;  . ENDARTERECTOMY Right 10/07/2019   Procedure: ENDARTERECTOMY CAROTID;  Surgeon: Rosetta Posner, MD;  Location[facility] OR;  Service: Vascular;  Laterality: Right;  . ENDOVEIN HARVEST OF GREATER SAPHENOUS VEIN Left 10/07/2019   Procedure: [place] Of Greater Saphenous Vein;  Surgeon: Gaye Pollack, MD;  Location: [facility] OR;  Service: Open Heart Surgery;  Laterality: Left;  . ESOPHAGOGASTRODUODENOSCOPY ENDOSCOPY    . KNEE ARTHROSCOPY  07/25/04   left  . [place]   right, open surgery- repair  . RIGHT/LEFT HEART CATH AND CORONARY ANGIOGRAPHY Bilateral 09/08/2019   Procedure: RIGHT/LEFT HEART CATH AND CORONARY ANGIOGRAPHY;  Surgeon: Minna Merritts, MD;  Location: [facility] CV LAB;  Service: Cardiovascular;  Laterality: Bilateral;  . TEE WITHOUT CARDIOVERSION N/A 10/07/2019   Procedure: TRANSESOPHAGEAL ECHOCARDIOGRAM (TEE);  Surgeon: Gaye Pollack, MD;  Location: [place];  Service: Open Heart Surgery;  Laterality: N/A;  . 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 TOTAL KNEE ARTHROPLASTY Right 07/13/2017   Procedure: RIGHT TOTAL KNEE ARTHROPLASTY;  Surgeon: Gaynelle Arabian, MD;  Location: [facility] ORS;  Service: Orthopedics;  Laterality: Right;    Family History  Problem Relation Age of Onset  . Heart disease Father        CAD, MI x 3  . Hypertension Father   . Alcohol abuse Father   . Diabetes Father   . Diabetes Sister   . Cancer Brother        lymphoma, in remission  . Colon cancer Brother   . Heart disease Sister        CABG x 3  . Prostate cancer Neg Hx     Social History  Socioeconomic History  . Marital status: Married    Spouse name: [name]  . Number of children: 3  . Years of education: Not on file  . Highest education level: Not on file  Occupational History  . Occupation: Engineer, manufacturing, Estate agent  Tobacco Use  . Smoking status: Former Smoker    Packs/day: 1.00    Years: 40.00    Pack years: 40.00    Quit date:  10/07/2019    Years since quitting: 0.3  . Smokeless tobacco: Never Used  . Tobacco comment: per patient wears a patch and has cut down on cigarettes/day (10/05/2019)  Vaping Use  . Vaping Use: Never used  Substance and Sexual Activity  . Alcohol use: Yes    Alcohol/week: 0.0 standard drinks    Comment: 5-6 cocktails, 1 times a week  . Drug use: No  . Sexual activity: Not on file  Other Topics Concern  . Not on file  Social History Narrative   From [place].  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   Social Determinants of Health   Financial Resource Strain:   . Difficulty of Paying Living Expenses:   Food Insecurity:   . Worried About Charity fundraiser in the Last Year:   . Arboriculturist in the Last Year:   Transportation Needs: No Transportation Needs  . Lack of Transportation (Medical): No  . Lack of Transportation (Non-Medical): No  Physical Activity:   . Days of Exercise per Week:   . Minutes of Exercise per Session:   Stress:   . Feeling of Stress :   Social Connections:   . Frequency of Communication with Friends and Family:   . Frequency of Social Gatherings with Friends and Family:   . Attends Religious Services:   . Active Member of Clubs or Organizations:   . Attends Archivist Meetings:   Marland Kitchen Marital Status:   Intimate Partner Violence:   . Fear of Current or Ex-Partner:   . Emotionally Abused:   Marland Kitchen Physically Abused:   . Sexually Abused:      Current Outpatient Medications:  .  acetaminophen (TYLENOL) 500 MG tablet, Take 1,000 mg by mouth every 8 (eight) hours as needed for mild pain or headache., Disp: , Rfl:  .  amLODipine (NORVASC) 10 MG tablet, Take 1 tablet (10 mg total) by mouth daily., Disp: 90 tablet, Rfl: 3 .  aspirin EC 81 MG tablet, Take 1 tablet (81 mg total) by mouth daily., Disp: , Rfl:  .  atorvastatin (LIPITOR) 80 MG tablet, Take 1 tablet (80 mg total) by mouth daily., Disp: 90 tablet, Rfl: 3 .  clopidogrel (PLAVIX) 75 MG  tablet, Take 75 mg by mouth daily., Disp: , Rfl:  .  fenofibrate 160 MG tablet, Take 1 tablet (160 mg total) by mouth daily., Disp: 90 tablet, Rfl: 1 .  furosemide (LASIX) 40 MG tablet, TAKE 1 TABLET BY MOUTH TWICE A DAY, Disp: 60 tablet, Rfl: 1 .  glimepiride (AMARYL) 2 MG tablet, Take 2 mg by mouth daily with breakfast., Disp: , Rfl:  .  glucose blood (TRUE METRIX BLOOD GLUCOSE TEST) test strip, 1 each by Other route daily. Use as instructed, Disp: 100 each, Rfl: 12 .  metFORMIN (GLUCOPHAGE) 1000 MG tablet, Take 1 tablet (1,000 mg total) by mouth 2 (two) times daily., Disp: 180 tablet, Rfl: 1 .  metoprolol tartrate (LOPRESSOR) 25 MG tablet, Take 1 tablet (25 mg total) by mouth 2 (  two) times daily., Disp: 180 tablet, Rfl: 1 .  nicotine (NICODERM CQ - DOSED IN MG/24 HOURS) 21 mg/24hr patch, Place 21 mg onto the skin daily as needed (nicotine dependence). , Disp: , Rfl:  .  nitroGLYCERIN (NITROSTAT) 0.4 MG SL tablet, Place 0.4 mg under the tongue every 5 (five) minutes as needed for chest pain., Disp: , Rfl:  .  pantoprazole (PROTONIX) 40 MG tablet, Take 40 mg by mouth daily before breakfast., Disp: , Rfl:  .  traZODone (DESYREL) 50 MG tablet, TAKE 1 TABLET BY MOUTH DAILY, Disp: 30 tablet, Rfl: 2 .  spironolactone (ALDACTONE) 25 MG tablet, Take 1 tablet (25 mg total) by mouth daily., Disp: 90 tablet, Rfl: 3   Allergies  Allergen Reactions  . Ramipril Cough  . Mucinex D [Pseudoephedrine-Guaifenesin Er] Other (See Comments) and Hypertension    Elevated BP, insomnia    ROS Review of Systems  Constitutional: Negative.   HENT: Negative.   Eyes: Negative.   Respiratory: Negative.  Negative for shortness of breath.   Cardiovascular: Negative.  Negative for chest pain, palpitations and leg swelling.  Gastrointestinal: Negative.   Endocrine: Negative.   Genitourinary: Negative.   Musculoskeletal: Negative.   Skin: Negative.   Allergic/Immunologic: Negative.   Neurological: Negative.     Hematological: Negative.   Psychiatric/Behavioral: Negative.   All other systems reviewed and are negative.    OBJECTIVE:    Physical Exam Vitals reviewed.  Constitutional:      General: He is not in acute distress.    Appearance: Normal appearance. He is not ill-appearing.  HENT:     Mouth/Throat:     Mouth: Mucous membranes are moist.  Eyes:     Pupils: Pupils are equal, round, and reactive to light.  Cardiovascular:     Rate and Rhythm: Normal rate and regular rhythm.     Pulses: Normal pulses.     Heart sounds: Normal heart sounds.  Pulmonary:     Effort: Pulmonary effort is normal.     Breath sounds: Normal breath sounds.  Abdominal:     General: Bowel sounds are normal.     Palpations: Abdomen is soft. There is no mass.     Tenderness: There is no abdominal tenderness.  Musculoskeletal:     Cervical back: Normal range of motion and neck supple.     Right lower leg: No edema.     Left lower leg: No edema.  Neurological:     Mental Status: He is alert.  Psychiatric:        Mood and Affect: Mood normal.        Behavior: Behavior normal.     BP 114/60   Pulse 84   Ht 5\' 9"  (1.753 m)   Wt 234 lb 3.2 oz (106.2 kg)   BMI 34.59 kg/m  Wt Readings from Last 3 Encounters:  02/10/20 234 lb 3.2 oz (106.2 kg)  01/06/20 235 lb 1.6 oz (106.6 kg)  12/22/19 237 lb 8 oz (107.7 kg)    Health Maintenance Due  Topic Date Due  . Hepatitis C Screening  Never done  . COVID-19 Vaccine (1) Never done  . OPHTHALMOLOGY EXAM  05/29/2015  . FOOT EXAM  11/21/2015  . PNA vac Low Risk Adult (2 of 2 - PPSV23) 07/06/2018    There are no preventive care reminders to display for this patient.  CBC Latest Ref Rng & Units 12/14/2019 12/13/2019 12/11/2019  WBC 4.0 - 10.5 K/uL 8.6 8.3 5.6  Hemoglobin  13.0 - 17.0 g/dL 11.3(L) 10.8(L) 9.8(L)  Hematocrit 39 - 52 % 38.3(L) 38.1(L) 34.3(L)  Platelets 150 - 400 K/uL 293 282 278   CMP Latest Ref Rng & Units 12/14/2019 12/13/2019 12/12/2019   Glucose 70 - 99 mg/dL 155(H) 307(H) 180(H)  BUN 8 - 23 mg/dL 51(H) 45(H) 41(H)  Creatinine 0.61 - 1.24 mg/dL 1.74(H) 1.79(H) 1.72(H)  Sodium 135 - 145 mmol/L 137 135 135  Potassium 3.5 - 5.1 mmol/L 3.9 4.6 4.4  Chloride 98 - 111 mmol/L 91(L) 91(L) 92(L)  CO2 22 - 32 mmol/L 36(H) 36(H) 34(H)  Calcium 8.9 - 10.3 mg/dL 9.2 9.2 9.0  Total Protein 6.5 - 8.1 g/dL - - -  Total Bilirubin 0.3 - 1.2 mg/dL - - -  Alkaline Phos 38 - 126 U/L - - -  AST 15 - 41 U/L - - -  ALT 0 - 44 U/L - - -    Lab Results  Component Value Date   TSH 1.65 11/21/2009   Lab Results  Component Value Date   ALBUMIN 3.8 12/07/2019   ANIONGAP 10 12/14/2019   GFR 65.78 11/15/2014   Lab Results  Component Value Date   CHOL 84 10/11/2019   CHOL 155 11/15/2014   CHOL 168 09/26/2013   HDL 26 (L) 10/11/2019   HDL 35.00 (L) 11/15/2014   HDL 31 (L) 09/26/2013   LDLCALC 39 10/11/2019   LDLCALC 93 09/26/2013   LDLCALC 44 05/14/2013   CHOLHDL 3.2 10/11/2019   CHOLHDL 4 11/15/2014   CHOLHDL 5.4 09/26/2013   Lab Results  Component Value Date   TRIG 93 10/11/2019   Lab Results  Component Value Date   HGBA1C 7.4 (H) 12/07/2019   HGBA1C 8.2 (H) 10/05/2019   HGBA1C 7.4 (H) 07/08/2017   Lab Results  Component Value Date   GLUCOSE 155 (H) 12/14/2019   GLUCOSE 307 (H) 12/13/2019   GLUCOSE 180 (H) 12/12/2019   GLUCOSE 272 (H) 12/11/2019   GLUCOSE 215 (H) 12/10/2019       ASSESSMENT & PLAN:   Problem List Items Addressed This Visit      Cardiovascular and Mediastinum   Essential hypertension    BP controlled on present medication blood pressure today was 114/60  Pt to  continue taking his medicine regularly.  He does not have any pedal edema or shortness of breath chest is clear      CAD (coronary artery disease)    Patient had a bypass surgery he is angina free.  He also has a right carotid endarterectomy is stable   His incision has healed well.  Heart is regular chest is clear without any rales.         Respiratory   COPD (chronic obstructive pulmonary disease) ([place])    Patient is trying to quit smoking        Other   Hyperlipidemia    Patient has gained some weight I told him to continue his statin treatment.  He also referred to nephrologist for evaluation of the kidney functions.      Smoker    Other Visit Diagnoses    Chronic kidney disease, unspecified CKD stage    -  Primary   Relevant Orders   Ambulatory referral to Nephrology      No orders of the defined types were placed in this encounter.     Follow-up: No follow-ups on file.    Dr. Jane Canary [facility] [address] [place], [place] [address]  By signing my name below, I, [employer], attest that this documentation has been prepared under the direction and in the presence of [name], MD. Electronically Signed: [name], MD 02/10/20, 11:55 AM

## 2020-02-16 DIAGNOSIS — E119 Type 2 diabetes mellitus without complications: Secondary | ICD-10-CM | POA: Diagnosis not present

## 2020-02-21 ENCOUNTER — Other Ambulatory Visit: Payer: Self-pay | Admitting: Internal Medicine

## 2020-02-23 ENCOUNTER — Ambulatory Visit: Payer: Self-pay | Admitting: *Deleted

## 2020-02-25 ENCOUNTER — Other Ambulatory Visit: Payer: Self-pay | Admitting: Internal Medicine

## 2020-03-21 ENCOUNTER — Encounter: Payer: Self-pay | Admitting: Nurse Practitioner

## 2020-03-21 ENCOUNTER — Ambulatory Visit (INDEPENDENT_AMBULATORY_CARE_PROVIDER_SITE_OTHER): Payer: Medicare Other | Admitting: Nurse Practitioner

## 2020-03-21 ENCOUNTER — Other Ambulatory Visit: Payer: Self-pay

## 2020-03-21 VITALS — BP 128/62 | HR 59 | Ht 69.0 in | Wt 239.2 lb

## 2020-03-21 DIAGNOSIS — I200 Unstable angina: Secondary | ICD-10-CM

## 2020-03-21 DIAGNOSIS — N183 Chronic kidney disease, stage 3 unspecified: Secondary | ICD-10-CM

## 2020-03-21 DIAGNOSIS — I10 Essential (primary) hypertension: Secondary | ICD-10-CM | POA: Diagnosis not present

## 2020-03-21 DIAGNOSIS — E785 Hyperlipidemia, unspecified: Secondary | ICD-10-CM | POA: Diagnosis not present

## 2020-03-21 DIAGNOSIS — I2510 Atherosclerotic heart disease of native coronary artery without angina pectoris: Secondary | ICD-10-CM | POA: Diagnosis not present

## 2020-03-21 DIAGNOSIS — I5032 Chronic diastolic (congestive) heart failure: Secondary | ICD-10-CM

## 2020-03-21 MED ORDER — LOSARTAN POTASSIUM 50 MG PO TABS
50.0000 mg | ORAL_TABLET | Freq: Every day | ORAL | 3 refills | Status: DC
Start: 2020-03-21 — End: 2021-03-25

## 2020-03-21 NOTE — Progress Notes (Signed)
Office Visit    Patient Name: Joshua Roy Date of Encounter: 03/21/2020  Primary Care Provider:  [name], MD Primary Cardiologist:  Joshua Rogue, MD  Chief Complaint    69 year old male with a history of CAD status post four-vessel coronary artery bypass grafting in March 2021, carotid disease status post right CEA (09/2019), postoperative atrial fibrillation, hypertension, hyperlipidemia, type 2 diabetes mellitus, tobacco abuse, COPD, obesity, stage III chronic kidney disease, HFpEF, and PSVT, presents for follow-up related to CAD and heart failure.  Past Medical History    Past Medical History:  Diagnosis Date  . (HFpEF) heart failure with preserved ejection fraction ([place])    a. 07/2019 Echo: EF 60-65%. Mod LVH. Gr1 DD. No rwma. Nl RV size/fxn. Mildly dil LA; b. 11/2019 Echo: EF 55-60%, no rwma, mild LVH. Nl RV size/fxn. Mod dil LA.  Marland Kitchen ACE-inhibitor cough   . Anxiety   . Bronchitis 06/27/2016  . Carotid arterial disease ([place])    a. 09/2019 Carotid U/S: RICA 68-34%, LICA 1-96%; b. 08/2295 R CEA.  . CKD (chronic kidney disease), stage III   . COPD (chronic obstructive pulmonary disease) (HCC)    cath, mild inf. hypokinesis EF 49%, 100% ROA  . Coronary artery disease    a. 2001 Cath: LM 10, LAD 60p/m, D1 30, LCX 20p, RCA 176m/d-->Med Rx; b. 05/2017 Ex Mv: Ex time 5:46, antlat twi @ rest, fixed infarct w/ mild peri-infarct ischemia. EF 38%; c. 08/2019 Cath: LM 40ost, 70d, LAD 80m, LCX 62m, RCA 80-90p, 175m; d. 09/2019 CABG x4: LIMA->LAD, VG->RI, VG->OM, VG->RPDA.  . Diabetes mellitus type II   . Hyperlipidemia   . Hypertension   . MI (myocardial infarction) ([place]) age 27  . OA (osteoarthritis)    knee OA, injected 2012 by ortho  . PAF (paroxysmal atrial fibrillation) ([place])    a. 09/2019 post-op CABG-->Amio.  . Pneumonia   . PSVT (paroxysmal supraventricular tachycardia) ([place])    a. 08/2019 Zio: 189 brief runs of SVT (fastest 174 x 6 beats, longest 15 beats @ 107).  .  Tobacco abuse    Past Surgical History:  Procedure Laterality Date  . CARDIAC CATHETERIZATION  05/06/2000   @ [place]  . COLONOSCOPY WITH PROPOFOL N/A 04/20/2018   Procedure: COLONOSCOPY WITH PROPOFOL;  Surgeon: Joshua Lame, MD;  Location: [facility] ENDOSCOPY;  Service: Endoscopy;  Laterality: N/A;  . CORONARY ARTERY BYPASS GRAFT N/A 10/07/2019   Procedure: CORONARY ARTERY BYPASS GRAFTING (CABG) using LIMA to LAD; Endoscopic harvesting of left greater saphenous Roy: SVG to RAMUS; SVG to OM1; SVG to PDA.;  Surgeon: Joshua Pollack, MD;  Location: [facility];  Service: Open Heart Surgery;  Laterality: N/A;  . ENDARTERECTOMY Right 10/07/2019   Procedure: ENDARTERECTOMY CAROTID;  Surgeon: Joshua Posner, MD;  Location: [facility] OR;  Service: Vascular;  Laterality: Right;  . ENDOVEIN HARVEST OF GREATER SAPHENOUS Roy Left 10/07/2019   Procedure: Joshua Roy;  Surgeon: Joshua Pollack, MD;  Location: [facility] OR;  Service: Open Heart Surgery;  Laterality: Left;  . ESOPHAGOGASTRODUODENOSCOPY ENDOSCOPY    . KNEE ARTHROSCOPY  07/25/04   left  . [place]   right, open surgery- repair  . RIGHT/LEFT HEART CATH AND CORONARY ANGIOGRAPHY Bilateral 09/08/2019   Procedure: RIGHT/LEFT HEART CATH AND CORONARY ANGIOGRAPHY;  Surgeon: Joshua Merritts, MD;  Location: [place] CV LAB;  Service: Cardiovascular;  Laterality: Bilateral;  . TEE WITHOUT CARDIOVERSION N/A 10/07/2019   Procedure: TRANSESOPHAGEAL ECHOCARDIOGRAM (  TEE);  Surgeon: Joshua Pollack, MD;  Location: [facility] OR;  Service: Open Heart Surgery;  Laterality: N/A;  . TOTAL KNEE ARTHROPLASTY Left 07/14/2016   Procedure: LEFT TOTAL KNEE ARTHROPLASTY;  Surgeon: Joshua Arabian, MD;  Location: [facility] ORS;  Service: Orthopedics;  Laterality: Left;  . TOTAL KNEE ARTHROPLASTY Right 07/13/2017   Procedure: RIGHT TOTAL KNEE ARTHROPLASTY;  Surgeon: Joshua Arabian, MD;  Location: [facility] ORS;  Service: Orthopedics;  Laterality: Right;     Allergies  Allergies  Allergen Reactions  . Ramipril Cough  . Mucinex D [Pseudoephedrine-Guaifenesin Er] Other (See Comments) and Hypertension    Elevated BP, insomnia    History of Present Illness    69 year old male with a history of CAD status post four-vessel CABG, carotid disease status post right CEA, postoperative atrial fibrillation, hypertension, hyperlipidemia, type 2 diabetes mellitus, tobacco abuse, COPD, obesity, CKD three, HFpEF, and PSVT.  Initially underwent catheterization 2001, revealing an occluded mid/distal RCA, and was medically managed.  [name] 2021, had a prolonged episode of presyncope followed by a brief episode of syncope for which he was seen in the [facility] ED, but left AMA.  He subsequently noted progressive weight gain, dyspnea, lower extremity and scrotal edema, and orthopnea.  Echo showed normal LV function with grade 1 diastolic dysfunction and no significant valvular disease.  He underwent diagnostic catheterization revealing severe, multivessel CAD and underwent CABG x4 in March 2021.  Preop carotid Dopplers revealed severe right internal carotid artery stenosis and this was treated with right carotid endarterectomy at the time of his CABG.  Postoperative course was minimally complicated by A. fib, which was successfully managed with amiodarone, which has since been discontinued.  In April, he was volume overloaded at CT surgical visit and Lasix dosing was escalated to 40 mg twice daily.  At May 12 office visit, he remained volume overloaded and hypoxic with saturations of 68 to 78% on room air.  He was sent to the emergency room and subsequently admitted and aggressively diuresed.  Echo during hospitalization continued showed normal LV function-55-60%, with mild LVH.  He was discharged home down 30 pounds.  Office follow-up May 25, he was still smoking but remained euvolemic.  Since his last visit, he has done exceptionally well.  He weighs daily and notes that his  weight is home is generally in the 230-233 range.  In that setting, he has been able to reduce his Lasix to just 40 mg once a day.  He is very active in his yard and has not been having any chest pain or dyspnea.  He continues to smoke about a half a pack per day and is not currently interested in quitting.  He denies palpitations, PND, orthopnea, dizziness, syncope, edema, or early satiety.  Home Medications    Prior to Admission medications   Medication Sig Start Date End Date Taking? Authorizing Provider  acetaminophen (TYLENOL) 500 MG tablet Take 1,000 mg by mouth every 8 (eight) hours as needed for mild pain or headache.    [provider]  amLODipine (NORVASC) 10 MG tablet Take 1 tablet (10 mg total) by mouth daily. 10/26/19   Loel Dubonnet, NP  aspirin EC 81 MG tablet Take 1 tablet (81 mg total) by mouth daily. 10/14/19   Gold, Wayne E, PA-C  atorvastatin (LIPITOR) 80 MG tablet Take 1 tablet (80 mg total) by mouth daily. 11/21/14   Tonia Ghent, MD  clopidogrel (PLAVIX) 75 MG tablet Take 75 mg by mouth daily.  [provider]  fenofibrate 160 MG tablet Take 1 tablet (160 mg total) by mouth daily. 12/14/19   Lorella Nimrod, MD  furosemide (LASIX) 40 MG tablet TAKE 1 TABLET BY MOUTH ONCE A DAY 02/07/20   Joshua Athens, MD  glimepiride (AMARYL) 2 MG tablet TAKE 1/2 TABLET BY MOUTH IN THE MORNING 02/21/20   Joshua Athens, MD  glucose blood (TRUE METRIX BLOOD GLUCOSE TEST) test strip 1 each by Other route daily. Use as instructed 01/24/20   Joshua Athens, MD  losartan-hydrochlorothiazide (HYZAAR) 50-12.5 MG tablet Take 1 tablet by mouth daily. 01/28/20   [provider]  metFORMIN (GLUCOPHAGE) 1000 MG tablet Take 1 tablet (1,000 mg total) by mouth 2 (two) times daily. 01/11/15   Tonia Ghent, MD  metoprolol tartrate (LOPRESSOR) 25 MG tablet Take 1 tablet (25 mg total) by mouth 2 (two) times daily. 01/11/15   Tonia Ghent, MD  mupirocin ointment (BACTROBAN) 2 % ONE  APPLICATION NASALLY TWICE A DAY FOR 5 DAYS PRIOR TO SURGERY 10/05/19   [provider]  nicotine (NICODERM CQ - DOSED IN MG/24 HOURS) 21 mg/24hr patch Place 21 mg onto the skin daily as needed (nicotine dependence).     [provider]  nitroGLYCERIN (NITROSTAT) 0.4 MG SL tablet Place 0.4 mg under the tongue every 5 (five) minutes as needed for chest pain.    [provider]  pantoprazole (PROTONIX) 40 MG tablet Take 40 mg by mouth daily before breakfast. 09/01/19   [provider]  spironolactone (ALDACTONE) 25 MG tablet Take 1 tablet (25 mg total) by mouth daily. 10/17/19 01/15/20  Bensimhon, Shaune Pascal, MD  traZODone (DESYREL) 50 MG tablet TAKE 1 TABLET BY MOUTH EVERY DAY 02/27/20   Joshua Athens, MD    Review of Systems    He denies chest pain, palpitations, dyspnea, pnd, orthopnea, n, v, dizziness, syncope, edema, weight gain, or early satiety.  All other systems reviewed and are otherwise negative except as noted above.  Physical Exam    VS:  BP 128/62 (BP Location: Left Arm, Patient Position: Sitting, Cuff Size: Normal)   Pulse (!) 59   Ht 5\' 9"  (1.753 m)   Wt 239 lb 3.2 oz (108.5 kg)   SpO2 96%   BMI 35.32 kg/m  , BMI Body mass index is 35.32 kg/m. GEN: Well nourished, well developed, in no acute distress. HEENT: normal. Neck: Supple, no JVD, carotid bruits, or masses. Cardiac: RRR, no murmurs, rubs, or gallops. No clubbing, cyanosis, edema.  Radials/PT 2+ and equal bilaterally.  Respiratory:  Respirations regular and unlabored, clear to auscultation bilaterally. GI: Soft, nontender, nondistended, BS + x 4. MS: no deformity or atrophy. Skin: warm and dry, no rash. Neuro:  Strength and sensation are intact. Psych: Normal affect.  Accessory Clinical Findings    ECG personally reviewed by me today -sinus bradycardia, 59, anteroseptal infarct, lateral T wave inversion- no acute changes.  Lab Results  Component Value Date   WBC 8.6 12/14/2019    HGB 11.3 (L) 12/14/2019   HCT 38.3 (L) 12/14/2019   MCV 73.9 (L) 12/14/2019   PLT 293 12/14/2019   Lab Results  Component Value Date   CREATININE 1.74 (H) 12/14/2019   BUN 51 (H) 12/14/2019   NA 137 12/14/2019   K 3.9 12/14/2019   CL 91 (L) 12/14/2019   CO2 36 (H) 12/14/2019   Lab Results  Component Value Date   ALT 22 12/07/2019   AST 19 12/07/2019   ALKPHOS  25 (L) 12/07/2019   BILITOT 1.0 12/07/2019   Lab Results  Component Value Date   CHOL 84 10/11/2019   HDL 26 (L) 10/11/2019   LDLCALC 39 10/11/2019   LDLDIRECT 109.0 11/15/2014   TRIG 93 10/11/2019   CHOLHDL 3.2 10/11/2019    Lab Results  Component Value Date   HGBA1C 7.4 (H) 12/07/2019    Assessment & Plan    1.  Coronary artery disease: Status post CABG x4 in March of this year.  He has been doing well without any chest pain or dyspnea.  He remains on aspirin, statin, Plavix, beta-blocker, and ARB therapy.   2.  Chronic heart failure with preserved EF: Euvolemic on examination today.  His weight has been stable in the 230-233 range on his home scale and he has reduced his Lasix to 40 mg once a day.  Heart rate and blood pressure stable.  3.  Essential hypertension: Blood pressure stable.  He remains on beta-blocker, amlodipine, and losartan-HCTZ.  In the setting of stage III chronic kidney disease, I'm going to change his losartan HCTZ to losartan only.  4.  Carotid arterial disease: Status post right-sided carotid endarterectomy at time of bypass in March.  He is followed by vascular surgery in [facility] and remains on aspirin, Plavix, and statin therapy.  5.  Postoperative atrial fibrillation: Denies palpitations.  On beta-blocker therapy.  6.  Hyperlipidemia/hypertriglyceridemia: LDL of 39 with triglycerides of 93 in March of this note year with normal LFTs.  Continue high potency statin therapy and fenofibrate.  7.  Stage III chronic kidney disease: Creatinine was 1.74 in May.  He has follow-up with  nephrology within the next month.  Changing losartan HCTZ to losartan alone.  He has reduced his Lasix to 40 mg once a day and is tolerating well.  8.  Type 2 diabetes mellitus: On Amaryl and Metformin and followed closely by his primary care provider.  9.  Tobacco abuse/COPD: Still smoking just under a pack a day.  Previously tried nicotine patches which she did not think helped much.  Is not currently interested in alternative therapies.  Complete cessation advised.  10.  Disposition: Follow-up in clinic in 6 months or sooner if necessary.   Murray Hodgkins, NP 03/21/2020, 12:35 PM

## 2020-03-21 NOTE — Patient Instructions (Signed)
Medication Instructions:  - Your physician has recommended you make the following change in your medication:   1) STOP losartan/hctz  2) START losartan 50 mg- take 1 tablet by mouth once daily   *If you need a refill on your cardiac medications before your next appointment, please call your pharmacy*   Lab Work: - none ordered  If you have labs (blood work) drawn today and your tests are completely normal, you will receive your results only by: Marland Kitchen MyChart Message (if you have MyChart) OR . A paper copy in the mail If you have any lab test that is abnormal or we need to change your treatment, we will call you to review the results.   Testing/Procedures: - none ordered   Follow-Up: At [facility], you and your health needs are our priority.  As part of our continuing mission to provide you with exceptional heart care, we have created designated Provider Care Teams.  These Care Teams include your primary Cardiologist (physician) and Advanced Practice Providers (APPs -  Physician Assistants and Nurse Practitioners) who all work together to provide you with the care you need, when you need it.  We recommend signing up for the patient portal called "MyChart".  Sign up information is provided on this After Visit Summary.  MyChart is used to connect with patients for Virtual Visits (Telemedicine).  Patients are able to view lab/test results, encounter notes, upcoming appointments, etc.  Non-urgent messages can be sent to your provider as well.   To learn more about what you can do with MyChart, go to [email or web address].    Your next appointment:   6 month(s)  The format for your next appointment:   In Person  Provider:    You may see Ida Rogue, MD or one of the following Advanced Practice Providers on your designated Care Team:    Murray Hodgkins, NP  Christell Faith, PA-C  Marrianne Mood, PA-C    Other Instructions - n/a

## 2020-03-26 DIAGNOSIS — X32XXXA Exposure to sunlight, initial encounter: Secondary | ICD-10-CM | POA: Diagnosis not present

## 2020-03-26 DIAGNOSIS — D2262 Melanocytic nevi of left upper limb, including shoulder: Secondary | ICD-10-CM | POA: Diagnosis not present

## 2020-03-26 DIAGNOSIS — D2261 Melanocytic nevi of right upper limb, including shoulder: Secondary | ICD-10-CM | POA: Diagnosis not present

## 2020-03-26 DIAGNOSIS — D2272 Melanocytic nevi of left lower limb, including hip: Secondary | ICD-10-CM | POA: Diagnosis not present

## 2020-03-26 DIAGNOSIS — L570 Actinic keratosis: Secondary | ICD-10-CM | POA: Diagnosis not present

## 2020-03-26 DIAGNOSIS — D2271 Melanocytic nevi of right lower limb, including hip: Secondary | ICD-10-CM | POA: Diagnosis not present

## 2020-03-26 DIAGNOSIS — D225 Melanocytic nevi of trunk: Secondary | ICD-10-CM | POA: Diagnosis not present

## 2020-03-26 DIAGNOSIS — L821 Other seborrheic keratosis: Secondary | ICD-10-CM | POA: Diagnosis not present

## 2020-04-22 ENCOUNTER — Other Ambulatory Visit: Payer: Self-pay | Admitting: Internal Medicine

## 2020-04-27 ENCOUNTER — Other Ambulatory Visit: Payer: Self-pay

## 2020-04-27 ENCOUNTER — Other Ambulatory Visit: Payer: Self-pay | Admitting: *Deleted

## 2020-04-27 NOTE — Patient Outreach (Signed)
[facility]) Care Management  04/27/2020  Joshua Roy [birth year] [id number]  [facility] case closure for EMMI referred patient  Joshua Roy was referred to [facility] on 12/19/19 for RED ON EMMI ALERT Day #1 Date:Friday 12/16/19 1302 Red Alert Reason:Unfilled prescriptions? 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 My health is prime.  I am doing great." He reports trying to prevent neighbors cattle from crossing into his property He reports to [facility] RN CM that he does not feel he needs to continue with [facility] services at this time  He denies worsing changes in his DM, HTN CHF, COPD conditions  [facility] RN CM will close Joshua Roy case as requested by him Letters to pt and MD    Joelene Millin L. Lavina Hamman, RN, BSN, [place] Coordinator Office number [phone] [facility] number [phone] Fax number [phone]

## 2020-05-14 DIAGNOSIS — I10 Essential (primary) hypertension: Secondary | ICD-10-CM | POA: Diagnosis not present

## 2020-05-14 DIAGNOSIS — Z23 Encounter for immunization: Secondary | ICD-10-CM | POA: Diagnosis not present

## 2020-05-14 DIAGNOSIS — N1832 Chronic kidney disease, stage 3b: Secondary | ICD-10-CM | POA: Diagnosis not present

## 2020-05-14 DIAGNOSIS — E1122 Type 2 diabetes mellitus with diabetic chronic kidney disease: Secondary | ICD-10-CM | POA: Diagnosis not present

## 2020-05-15 ENCOUNTER — Other Ambulatory Visit: Payer: Self-pay | Admitting: Nephrology

## 2020-05-15 DIAGNOSIS — N1832 Chronic kidney disease, stage 3b: Secondary | ICD-10-CM

## 2020-05-15 DIAGNOSIS — E1122 Type 2 diabetes mellitus with diabetic chronic kidney disease: Secondary | ICD-10-CM

## 2020-05-23 ENCOUNTER — Other Ambulatory Visit: Payer: Self-pay

## 2020-05-23 ENCOUNTER — Ambulatory Visit
Admission: RE | Admit: 2020-05-23 | Discharge: 2020-05-23 | Disposition: A | Discharge status: Home / Self Care | Payer: Medicare Other | Source: Ambulatory Visit | Attending: Nephrology | Admitting: Nephrology

## 2020-05-23 DIAGNOSIS — N1832 Chronic kidney disease, stage 3b: Secondary | ICD-10-CM | POA: Diagnosis not present

## 2020-05-23 DIAGNOSIS — E1122 Type 2 diabetes mellitus with diabetic chronic kidney disease: Secondary | ICD-10-CM | POA: Diagnosis not present

## 2020-05-23 DIAGNOSIS — N189 Chronic kidney disease, unspecified: Secondary | ICD-10-CM | POA: Diagnosis not present

## 2020-06-05 DIAGNOSIS — Z23 Encounter for immunization: Secondary | ICD-10-CM | POA: Diagnosis not present

## 2020-06-12 ENCOUNTER — Other Ambulatory Visit (INDEPENDENT_AMBULATORY_CARE_PROVIDER_SITE_OTHER): Payer: Medicare Other

## 2020-06-12 DIAGNOSIS — I10 Essential (primary) hypertension: Secondary | ICD-10-CM | POA: Diagnosis not present

## 2020-06-12 DIAGNOSIS — N521 Erectile dysfunction due to diseases classified elsewhere: Secondary | ICD-10-CM

## 2020-06-12 DIAGNOSIS — E782 Mixed hyperlipidemia: Secondary | ICD-10-CM

## 2020-06-12 DIAGNOSIS — R0602 Shortness of breath: Secondary | ICD-10-CM | POA: Diagnosis not present

## 2020-06-12 DIAGNOSIS — E1169 Type 2 diabetes mellitus with other specified complication: Secondary | ICD-10-CM

## 2020-06-13 LAB — CBC WITH DIFFERENTIAL/PLATELET
Absolute Monocytes: 638 cells/uL (ref 200–950)
Basophils Absolute: 94 cells/uL (ref 0–200)
Basophils Relative: 1.1 %
Eosinophils Absolute: 0 cells/uL — ABNORMAL LOW (ref 15–500)
Eosinophils Relative: 0 %
HCT: 40.4 % (ref 38.5–50.0)
Hemoglobin: 13.2 g/dL (ref 13.2–17.1)
Lymphs Abs: 2720 cells/uL (ref 850–3900)
MCH: 25.8 pg — ABNORMAL LOW (ref 27.0–33.0)
MCHC: 32.7 g/dL (ref 32.0–36.0)
MCV: 79.1 fL — ABNORMAL LOW (ref 80.0–100.0)
MPV: 9.7 fL (ref 7.5–12.5)
Monocytes Relative: 7.5 %
Neutro Abs: 5049 cells/uL (ref 1500–7800)
Neutrophils Relative %: 59.4 %
Platelets: 307 10*3/uL (ref 140–400)
RBC: 5.11 10*6/uL (ref 4.20–5.80)
RDW: 16.5 % — ABNORMAL HIGH (ref 11.0–15.0)
Total Lymphocyte: 32 %
WBC: 8.5 10*3/uL (ref 3.8–10.8)

## 2020-06-13 LAB — COMPLETE METABOLIC PANEL WITH GFR
AG Ratio: 1.7 (calc) (ref 1.0–2.5)
ALT: 16 U/L (ref 9–46)
AST: 17 U/L (ref 10–35)
Albumin: 4.3 g/dL (ref 3.6–5.1)
Alkaline phosphatase (APISO): 32 U/L — ABNORMAL LOW (ref 35–144)
BUN/Creatinine Ratio: 14 (calc) (ref 6–22)
BUN: 27 mg/dL — ABNORMAL HIGH (ref 7–25)
CO2: 23 mmol/L (ref 20–32)
Calcium: 9.7 mg/dL (ref 8.6–10.3)
Chloride: 102 mmol/L (ref 98–110)
Creat: 1.96 mg/dL — ABNORMAL HIGH (ref 0.70–1.18)
GFR, Est African American: 39 mL/min/{1.73_m2} — ABNORMAL LOW (ref >=60)
GFR, Est Non African American: 34 mL/min/{1.73_m2} — ABNORMAL LOW (ref >=60)
Globulin: 2.5 g/dL (calc) (ref 1.9–3.7)
Glucose, Bld: 100 mg/dL — ABNORMAL HIGH (ref 65–99)
Potassium: 5.2 mmol/L (ref 3.5–5.3)
Sodium: 142 mmol/L (ref 135–146)
Total Bilirubin: 0.3 mg/dL (ref 0.2–1.2)
Total Protein: 6.8 g/dL (ref 6.1–8.1)

## 2020-06-13 LAB — LIPID PANEL
Cholesterol: 165 mg/dL (ref <200)
HDL: 34 mg/dL — ABNORMAL LOW (ref >=40)
LDL Cholesterol (Calc): 100 mg/dL (calc) — ABNORMAL HIGH
Non-HDL Cholesterol (Calc): 131 mg/dL (calc) — ABNORMAL HIGH (ref <130)
Total CHOL/HDL Ratio: 4.9 (calc) (ref <5.0)
Triglycerides: 185 mg/dL — ABNORMAL HIGH (ref <150)

## 2020-06-13 LAB — HEMOGLOBIN A1C
Hgb A1c MFr Bld: 6.9 % of total Hgb — ABNORMAL HIGH (ref <5.7)
Mean Plasma Glucose: 151 (calc)
eAG (mmol/L): 8.4 (calc)

## 2020-06-13 LAB — TSH: TSH: 1.66 mIU/L (ref 0.40–4.50)

## 2020-06-14 ENCOUNTER — Other Ambulatory Visit: Payer: Self-pay | Admitting: Internal Medicine

## 2020-06-18 ENCOUNTER — Other Ambulatory Visit: Payer: Self-pay

## 2020-06-18 ENCOUNTER — Other Ambulatory Visit: Payer: Self-pay | Admitting: Internal Medicine

## 2020-06-18 ENCOUNTER — Ambulatory Visit (INDEPENDENT_AMBULATORY_CARE_PROVIDER_SITE_OTHER): Payer: Medicare Other | Admitting: Internal Medicine

## 2020-06-18 ENCOUNTER — Encounter: Payer: Self-pay | Admitting: Internal Medicine

## 2020-06-18 ENCOUNTER — Other Ambulatory Visit: Payer: Self-pay | Admitting: *Deleted

## 2020-06-18 VITALS — BP 118/67 | HR 85 | Ht 69.0 in | Wt 241.6 lb

## 2020-06-18 DIAGNOSIS — F419 Anxiety disorder, unspecified: Secondary | ICD-10-CM

## 2020-06-18 DIAGNOSIS — Z951 Presence of aortocoronary bypass graft: Secondary | ICD-10-CM | POA: Diagnosis not present

## 2020-06-18 DIAGNOSIS — I200 Unstable angina: Secondary | ICD-10-CM

## 2020-06-18 DIAGNOSIS — G4733 Obstructive sleep apnea (adult) (pediatric): Secondary | ICD-10-CM

## 2020-06-18 DIAGNOSIS — E1142 Type 2 diabetes mellitus with diabetic polyneuropathy: Secondary | ICD-10-CM

## 2020-06-18 DIAGNOSIS — F17200 Nicotine dependence, unspecified, uncomplicated: Secondary | ICD-10-CM

## 2020-06-18 DIAGNOSIS — N189 Chronic kidney disease, unspecified: Secondary | ICD-10-CM

## 2020-06-18 DIAGNOSIS — I10 Essential (primary) hypertension: Secondary | ICD-10-CM | POA: Diagnosis not present

## 2020-06-18 MED ORDER — FENOFIBRATE 160 MG PO TABS
160.0000 mg | ORAL_TABLET | Freq: Every day | ORAL | 1 refills | Status: DC
Start: 2020-06-18 — End: 2020-06-18

## 2020-06-18 MED ORDER — ALPRAZOLAM 0.25 MG PO TABS: 0.2500 mg | ORAL_TABLET | Freq: Two times a day (BID) | ORAL | 0 refills | Status: DC | PRN

## 2020-06-18 MED ORDER — FENOFIBRATE 160 MG PO TABS
160.0000 mg | ORAL_TABLET | Freq: Every day | ORAL | 1 refills | Status: DC
Start: 2020-06-18 — End: 2020-09-20

## 2020-06-18 MED ORDER — SILDENAFIL CITRATE 100 MG PO TABS: 100.0000 mg | ORAL_TABLET | Freq: Every day | ORAL | 0 refills | Status: DC | PRN

## 2020-06-18 NOTE — Assessment & Plan Note (Signed)
Blood pressure is under control on present medication 

## 2020-06-18 NOTE — Assessment & Plan Note (Signed)
Patient is referred to nephrologist.

## 2020-06-18 NOTE — Assessment & Plan Note (Signed)
Patient is using his CPAP machine regularly

## 2020-06-18 NOTE — Assessment & Plan Note (Signed)
-   I instructed the patient to stop smoking and provided them with smoking cessation materials.  - I informed the patient that smoking puts them at increased risk for cancer, COPD, hypertension, and more.  - Informed the patient to seek help if they begin to have trouble breathing, develop chest pain, start to cough up blood, feel faint, or pass out.  

## 2020-06-18 NOTE — Assessment & Plan Note (Signed)
-   Patient experiencing high levels of anxiety.  - Encouraged patient to engage in relaxing activities like yoga, meditation, journaling, going for a walk, or participating in a hobby.  - Encouraged patient to reach out to trusted friends or family members about recent struggles For 10 days Problem August (is normal

## 2020-06-18 NOTE — Progress Notes (Signed)
Established Patient Office Visit  Subjective:  Patient ID: Joshua Roy, male    DOB: [birth year]  Age: 69 y.o. MRN: [id number]  CC:  Chief Complaint  Patient presents with  . lab results    HPI  Joshua Roy presents for general checkup.  He is known to have congestive heart failure chronic renal insufficiency coronary artery disease status post bypass surgery and hypertension with hypertensive cardiovascular disease.  Denies any chest pain.  Denies any swelling of the legs.  Unfortunately started smoking back.  He has seen his nephrologist recently.    Past Medical History:  Diagnosis Date  . (HFpEF) heart failure with preserved ejection fraction ([place])    a. 07/2019 Echo: EF 60-65%. Mod LVH. Gr1 DD. No rwma. Nl RV size/fxn. Mildly dil LA; b. 11/2019 Echo: EF 55-60%, no rwma, mild LVH. Nl RV size/fxn. Mod dil LA.  Marland Kitchen ACE-inhibitor cough   . Anxiety   . Bronchitis 06/27/2016  . Carotid arterial disease ([place])    a. 09/2019 Carotid U/S: RICA 51-70%, LICA 0-17%; b. 10/9447 R CEA.  . CKD (chronic kidney disease), stage III ([place])   . COPD (chronic obstructive pulmonary disease) (HCC)    cath, mild inf. hypokinesis EF 49%, 100% ROA  . Coronary artery disease    a. 2001 Cath: LM 10, LAD 60p/m, D1 30, LCX 20p, RCA 160m/d-->Med Rx; b. 05/2017 Ex Mv: Ex time 5:46, antlat twi @ rest, fixed infarct w/ mild peri-infarct ischemia. EF 38%; c. 08/2019 Cath: LM 40ost, 70d, LAD 46m, LCX 57m, RCA 80-90p, 135m; d. 09/2019 CABG x4: LIMA->LAD, VG->RI, VG->OM, VG->RPDA.  . Diabetes mellitus type II   . Hyperlipidemia   . Hypertension   . MI (myocardial infarction) ([place]) age 60  . OA (osteoarthritis)    knee OA, injected 2012 by ortho  . PAF (paroxysmal atrial fibrillation) ([place])    a. 09/2019 post-op CABG-->Amio.  . Pneumonia   . PSVT (paroxysmal supraventricular tachycardia) ([place])    a. 08/2019 Zio: 189 brief runs of SVT (fastest 174 x 6 beats, longest 15 beats @ 107).  . Tobacco abuse      Past Surgical History:  Procedure Laterality Date  . CARDIAC CATHETERIZATION  05/06/2000   @ [place]  . COLONOSCOPY WITH PROPOFOL N/A 04/20/2018   Procedure: COLONOSCOPY WITH PROPOFOL;  Surgeon: [name], MD;  Location: [facility] END[facility]COPY;  Service: Endoscopy;  Laterality: N/A;  . CORONARY ARTERY BYPASS GRAFT N/A 10/07/2019   Procedure: CORONARY ARTERY BYPASS GRAFTING (CABG) using LIMA to LAD; Endoscopic harvesting of left greater saphenous vein: SVG to RAMUS; SVG to OM1; SVG to PDA.;  Surgeon: Gaye Pollack, MD;  Location: [place];  Service: Open Heart Surgery;  Laterality: N/A;  . ENDARTERECTOMY Right 10/07/2019   Procedure: ENDARTERECTOMY CAROTID;  Surgeon: Rosetta Posner, MD;  Location: [facility] OR;  Service: Vascular;  Laterality: Right;  . ENDOVEIN HARVEST OF GREATER SAPHENOUS VEIN Left 10/07/2019   Procedure: [place]opes Of Greater Saphenous Vein;  Surgeon: Gaye Pollack, MD;  Location: [facility] OR;  Service: Open Heart Surgery;  Laterality: Left;  . ESOPHAGOGASTRODUODENOSCOPY ENDOSCOPY    . KNEE ARTHROSCOPY  07/25/04   left  . [place]   right, open surgery- repair  . RIGHT/LEFT HEART CATH AND CORONARY ANGIOGRAPHY Bilateral 09/08/2019   Procedure: RIGHT/LEFT HEART CATH AND CORONARY ANGIOGRAPHY;  Surgeon: Minna Merritts, MD;  Location: [place] CV LAB;  Service: Cardiovascular;  Laterality: Bilateral;  . TEE WITHOUT CARDIOVERSION  N/A 10/07/2019   Procedure: TRANSESOPHAGEAL ECHOCARDIOGRAM (TEE);  Surgeon: Gaye Pollack, MD;  Location: [place];  Service: Open Heart Surgery;  Laterality: N/A;  . 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 TOTAL KNEE ARTHROPLASTY Right 07/13/2017   Procedure: RIGHT TOTAL KNEE ARTHROPLASTY;  Surgeon: Gaynelle Arabian, MD;  [facility];  Service: Orthopedics;  Laterality: Right;    Family History  Problem  Relation Age of Onset  . Heart disease Father        CAD, MI x 3  . Hypertension Father   . Alcohol abuse Father   . Diabetes Father   . Diabetes Sister   . Cancer Brother        lymphoma, in remission  . Colon cancer Brother   . Heart disease Sister        CABG x 3  . Prostate cancer Neg Hx     Social History   Socioeconomic History  . Marital status: Married    Spouse name: [name]  . Number of children: 3  . Years of education: Not on file  . Highest education level: Not on file  Occupational History  . Occupation: Engineer, manufacturing, Estate agent  Tobacco Use  . Smoking status: Former Smoker    Packs/day: 1.00    Years: 40.00    Pack years: 40.00    Quit date: 10/07/2019    Years since quitting: 0.6  . Smokeless tobacco: Never Used  . Tobacco comment: per patient wears a patch and has cut down on cigarettes/day (10/05/2019)  Vaping Use  . Vaping Use: Never used  Substance and Sexual Activity  . Alcohol use: Yes    Alcohol/week: 0.0 standard drinks    Comment: 5-6 cocktails, 1 times a week  . Drug use: No  . Sexual activity: Not on file  Other Topics Concern  . Not on file  Social History Narrative   From [place].  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   Social Determinants of Health   Financial Resource Strain:   . Difficulty of Paying Living Expenses: Not on file  Food Insecurity: No Food Insecurity  . Worried About Charity fundraiser in the Last Year: Never true  . Ran Out of Food in the Last Year: Never true  Transportation Needs: No Transportation Needs  . Lack of Transportation (Medical): No  . Lack of Transportation (Non-Medical): No  Physical Activity:   . Days of Exercise per Week: Not on file  . Minutes of Exercise per Session: Not on file  Stress:   . Feeling of Stress : Not on file  Social Connections:   . Frequency of Communication with Friends and Family: Not on file  . Frequency of Social Gatherings with Friends and Family: Not on  file  . Attends Religious Services: Not on file  . Active Member of Clubs or Organizations: Not on file  . Attends Archivist Meetings: Not on file  . Marital Status: Not on file  Intimate Partner Violence:   . Fear of Current or Ex-Partner: Not on file  . Emotionally Abused: Not on file  . Physically Abused: Not on file  . Sexually Abused: Not on file     Current Outpatient Medications:  .  clopidogrel (PLAVIX) 75 MG tablet, Take 75 mg by mouth daily., Disp: , Rfl:  .  fenofibrate 160 MG tablet, Take 1 tablet (160 mg total) by mouth daily., Disp: 90 tablet, Rfl: 1 .  furosemide (LASIX) 40 MG tablet, TAKE 1 TABLET BY MOUTH TWICE A DAY, Disp: 60 tablet, Rfl: 1 .  glimepiride (AMARYL) 2 MG tablet, TAKE 1/2 TABLET BY MOUTH IN THE MORNING, Disp: 45 tablet, Rfl: 7 .  glucose blood (TRUE METRIX BLOOD GLUCOSE TEST) test strip, 1 each by Other route daily. Use as instructed, Disp: 100 each, Rfl: 12 .  losartan (COZAAR) 50 MG tablet, Take 1 tablet (50 mg total) by mouth daily., Disp: 90 tablet, Rfl: 3 .  metFORMIN (GLUCOPHAGE) 1000 MG tablet, Take 1 tablet (1,000 mg total) by mouth 2 (two) times daily., Disp: 180 tablet, Rfl: 1 .  metoprolol tartrate (LOPRESSOR) 25 MG tablet, TAKE 1 TABLET BY MOUTH TWICE A DAY, Disp: 180 tablet, Rfl: 3 .  nitroGLYCERIN (NITROSTAT) 0.4 MG SL tablet, Place 0.4 mg under the tongue every 5 (five) minutes as needed for chest pain., Disp: , Rfl:  .  pantoprazole (PROTONIX) 40 MG tablet, Take 40 mg by mouth daily before breakfast., Disp: , Rfl:  .  traZODone (DESYREL) 50 MG tablet, TAKE 1 TABLET BY MOUTH EVERY DAY, Disp: 90 tablet, Rfl: 1 .  ALPRAZolam (XANAX) 0.25 MG tablet, Take 1 tablet (0.25 mg total) by mouth 2 (two) times daily as needed for anxiety., Disp: 20 tablet, Rfl: 0 .  sildenafil (VIAGRA) 100 MG tablet, Take 1 tablet (100 mg total) by mouth daily as needed for erectile dysfunction., Disp: 20 tablet, Rfl: 0 .  spironolactone (ALDACTONE) 25 MG  tablet, Take 1 tablet (25 mg total) by mouth daily., Disp: 90 tablet, Rfl: 3   Allergies  Allergen Reactions  . Ramipril Cough  . Mucinex D [Pseudoephedrine-Guaifenesin Er] Other (See Comments) and Hypertension    Elevated BP, insomnia    ROS Review of Systems  Constitutional: Negative.   HENT: Negative.   Eyes: Negative.   Respiratory: Negative.   Cardiovascular: Negative.   Gastrointestinal: Negative.   Endocrine: Negative.   Genitourinary: Negative.   Musculoskeletal: Negative.   Skin: Negative.   Allergic/Immunologic: Negative.   Neurological: Negative.   Hematological: Negative.   Psychiatric/Behavioral: Negative.   All other systems reviewed and are negative.     Objective:    Physical Exam Vitals reviewed.  Constitutional:      Appearance: Normal appearance.  HENT:     Mouth/Throat:     Mouth: Mucous membranes are moist.  Eyes:     Pupils: Pupils are equal, round, and reactive to light.  Neck:     Vascular: No carotid bruit.  Cardiovascular:     Rate and Rhythm: Normal rate and regular rhythm.     Pulses: Normal pulses.     Heart sounds: Normal heart sounds.  Pulmonary:     Effort: Pulmonary effort is normal.     Breath sounds: Normal breath sounds.  Abdominal:     General: Bowel sounds are normal.     Palpations: Abdomen is soft. There is no hepatomegaly, splenomegaly or mass.     Tenderness: There is no abdominal tenderness.     Hernia: No hernia is present.  Musculoskeletal:     Cervical back: Neck supple.     Right lower leg: No edema.     Left lower leg: No edema.  Skin:    Findings: No rash.  Neurological:     Mental Status: He is alert and oriented to person, place, and time.  Motor: No weakness.  Psychiatric:        Mood and Affect: Mood normal.        Behavior: Behavior normal.     BP 118/67   Pulse 85   Ht 5\' 9"  (1.753 m)   Wt 241 lb 9.6 oz (109.6 kg)   BMI 35.68 kg/m  Wt Readings from Last 3 Encounters:  06/18/20 241 lb  9.6 oz (109.6 kg)  03/21/20 239 lb 3.2 oz (108.5 kg)  02/10/20 234 lb 3.2 oz (106.2 kg)     Health Maintenance Due  Topic Date Due  . Hepatitis C Screening  Never done  . COVID-19 Vaccine (1) Never done  . FOOT EXAM  11/21/2015  . PNA vac Low Risk Adult (2 of 2 - PPSV23) 07/06/2018  . INFLUENZA VACCINE  02/26/2020    There are no preventive care reminders to display for this patient.  Lab Results  Component Value Date   TSH 1.66 06/12/2020   Lab Results  Component Value Date   WBC 8.5 06/12/2020   HGB 13.2 06/12/2020   HCT 40.4 06/12/2020   MCV 79.1 (L) 06/12/2020   PLT 307 06/12/2020   Lab Results  Component Value Date   NA 142 06/12/2020   K 5.2 06/12/2020   CO2 23 06/12/2020   GLUCOSE 100 (H) 06/12/2020   BUN 27 (H) 06/12/2020   CREATININE 1.96 (H) 06/12/2020   BILITOT 0.3 06/12/2020   ALKPHOS 25 (L) 12/07/2019   AST 17 06/12/2020   ALT 16 06/12/2020   PROT 6.8 06/12/2020   ALBUMIN 3.8 12/07/2019   CALCIUM 9.7 06/12/2020   ANIONGAP 10 12/14/2019   GFR 65.78 11/15/2014   Lab Results  Component Value Date   CHOL 165 06/12/2020   Lab Results  Component Value Date   HDL 34 (L) 06/12/2020   Lab Results  Component Value Date   LDLCALC 100 (H) 06/12/2020   Lab Results  Component Value Date   TRIG 185 (H) 06/12/2020   Lab Results  Component Value Date   CHOLHDL 4.9 06/12/2020   Lab Results  Component Value Date   HGBA1C 6.9 (H) 06/12/2020      Assessment & Plan:   Problem List Items Addressed This Visit      Cardiovascular and Mediastinum   Essential hypertension    Blood pressure is under control on present medication      Relevant Medications   fenofibrate 160 MG tablet   sildenafil (VIAGRA) 100 MG tablet     Respiratory   Obstructive sleep apnea syndrome    Patient is using his CPAP machine regularly        Genitourinary   Chronic kidney disease    Patient is referred to nephrologist.        Other   Smoker    - I  instructed the patient to stop smoking and provided them with smoking cessation materials.  - I informed the patient that smoking puts them at increased risk for cancer, COPD, hypertension, and more.  - Informed the patient to seek help if they begin to have trouble breathing, develop chest pain, start to cough up blood, feel faint, or pass out.        S/P CABG x 4    Denies chest pain or shortness of breath.  He denies any history of swelling of the legs.  Patient was advised to lose weight walk on a daily basis and stop smoking completely.  Anxiety - Primary    - Patient experiencing high levels of anxiety.  - Encouraged patient to engage in relaxing activities like yoga, meditation, journaling, going for a walk, or participating in a hobby.  - Encouraged patient to reach out to trusted friends or family members about recent struggles For 10 days Problem August (is normal      Relevant Medications   ALPRAZolam (XANAX) 0.25 MG tablet      Meds ordered this encounter  Medications  . ALPRAZolam (XANAX) 0.25 MG tablet    Sig: Take 1 tablet (0.25 mg total) by mouth 2 (two) times daily as needed for anxiety.    Dispense:  20 tablet    Refill:  0  . fenofibrate 160 MG tablet    Sig: Take 1 tablet (160 mg total) by mouth daily.    Dispense:  90 tablet    Refill:  1  . sildenafil (VIAGRA) 100 MG tablet    Sig: Take 1 tablet (100 mg total) by mouth daily as needed for erectile dysfunction.    Dispense:  20 tablet    Refill:  0    Follow-up: No follow-ups on file.    [name], MD

## 2020-06-18 NOTE — Assessment & Plan Note (Signed)
Denies chest pain or shortness of breath.  He denies any history of swelling of the legs.  Patient was advised to lose weight walk on a daily basis and stop smoking completely.

## 2020-06-19 DIAGNOSIS — N1832 Chronic kidney disease, stage 3b: Secondary | ICD-10-CM | POA: Diagnosis not present

## 2020-06-19 DIAGNOSIS — N2581 Secondary hyperparathyroidism of renal origin: Secondary | ICD-10-CM | POA: Diagnosis not present

## 2020-06-19 DIAGNOSIS — I10 Essential (primary) hypertension: Secondary | ICD-10-CM | POA: Diagnosis not present

## 2020-06-19 DIAGNOSIS — E1122 Type 2 diabetes mellitus with diabetic chronic kidney disease: Secondary | ICD-10-CM | POA: Diagnosis not present

## 2020-07-10 ENCOUNTER — Other Ambulatory Visit: Payer: Self-pay | Admitting: Internal Medicine

## 2020-08-02 ENCOUNTER — Other Ambulatory Visit: Payer: Self-pay | Admitting: Internal Medicine

## 2020-08-07 ENCOUNTER — Other Ambulatory Visit: Payer: Self-pay | Admitting: Internal Medicine

## 2020-08-07 NOTE — Telephone Encounter (Signed)
Is this okay to refill? 

## 2020-08-14 ENCOUNTER — Other Ambulatory Visit: Payer: Self-pay | Admitting: *Deleted

## 2020-08-14 MED ORDER — ATORVASTATIN CALCIUM 80 MG PO TABS: 80.0000 mg | ORAL_TABLET | Freq: Every day | ORAL | 3 refills | Status: DC

## 2020-09-16 ENCOUNTER — Other Ambulatory Visit: Payer: Self-pay | Admitting: Internal Medicine

## 2020-09-19 ENCOUNTER — Other Ambulatory Visit: Payer: Self-pay | Admitting: *Deleted

## 2020-09-19 MED ORDER — PANTOPRAZOLE SODIUM 40 MG PO TBEC
40.0000 mg | DELAYED_RELEASE_TABLET | Freq: Every day | ORAL | 3 refills | Status: DC
Start: 2020-09-19 — End: 2021-08-19

## 2020-09-20 ENCOUNTER — Other Ambulatory Visit: Payer: Self-pay | Admitting: *Deleted

## 2020-09-20 MED ORDER — FENOFIBRATE 160 MG PO TABS: 160.0000 mg | ORAL_TABLET | Freq: Every day | ORAL | 1 refills | Status: DC

## 2020-09-23 NOTE — Progress Notes (Signed)
Cardiology Office Note  Date:  09/24/2020   ID:  Joshua Roy, DOB September 07, 1950, MRN [id number]  PCP:  [name], MD   Chief Complaint  Patient presents with  . Follow-up    6 Months follow up and c/o bilateral leg weakness. Medications verbally reviewed with patient.     HPI:  Joshua Roy is 70 year old gentleman with a history of  coronary artery disease,  occluded mid to distal RCA  by cardiac catheterization in 2001,  residual 60% proximal to mid LAD disease at that time, 30% diagonal disease, 10% left main disease, 20% proximal left circumflex disease.  smoking and continues to smoke at least 1 1/2 pack per day hyperlipidemia,  obesity and diabetes   bypass surgery March 2021,  known carotid arterial disease prior carotid endarterectomy March 2021, postoperative atrial fibrillation He presents today for follow-up of his coronary artery disease, CHF  Last seen by myself in clinic May 2021 Discussed long hospital course April 2021 Multiorgan failure as detailed below  In follow-up today, Can't walk far, legs weak Sedentary, mostly winter not doing anything Feels oxygen dropping but has not been checking his numbers Wears oxygen overnight but not in the daytime Otherwise feels good Continues to smoke Owns 20 acres, more active in the spring and summer  Denies any chest pain concerning for angina Denies claudication symptoms  Not taking Lasix twice daily on a regular basis, takes it as needed Felt his mouth is dry  Lab work reviewed Total chol 165, LDL 100 HGBA1c 6.9 CR 1.96 , on lasix , changing dose himself  EKG personally reviewed by myself on todays visit nsr rate 75 bpm old anteriopr MI  Past medical history reviewed  May 2021, CHF exacerbation, sent to the hospital multiorgan failure COPD, anemia, acute on chronic kidney failure, difficulty with diuresis in the setting of CHF,  Management limited by his anxiety and desire to go home Chronic  hypoxia saturations in the 80s on 10 L oxygen throughout his hospital course -Also treated for bronchitis Poorly controlled diabetes He was in denial as to his numerous medical issues and continued desire to go home despite saturations in the low 80s on 10 L " I can do it at home" Wife was not available at the bedside for discussions on the days that I was rounding -He blamed the office (presumably cardiology office) for not fixing his symptoms when he called week earlier but there was no documentation of him calling with CHF exacerbation  His last stress test was 2009 when he had a small fixed inferior perfusion defect, ejection fraction 51%  PMH:   has a past medical history of (HFpEF) heart failure with preserved ejection fraction ([place]), ACE-inhibitor cough, Anxiety, Bronchitis (06/27/2016), Carotid arterial disease ([place]), CKD (chronic kidney disease), stage III ([place]), COPD (chronic obstructive pulmonary disease) ([place]), Coronary artery disease, Diabetes mellitus type II, Hyperlipidemia, Hypertension, MI (myocardial infarction) ([place]) (age 47), OA (osteoarthritis), PAF (paroxysmal atrial fibrillation) ([place]), Pneumonia, PSVT (paroxysmal supraventricular tachycardia) ([place]), and Tobacco abuse.  PSH:    Past Surgical History:  Procedure Laterality Date  . CARDIAC CATHETERIZATION  05/06/2000   @ [place]  . COLONOSCOPY WITH PROPOFOL N/A 04/20/2018   Procedure: COLONOSCOPY WITH PROPOFOL;  Surgeon: [name], MD;  Location: [facility] ENDOSCOPY;  Service: Endoscopy;  Laterality: N/A;  . CORONARY ARTERY BYPASS GRAFT N/A 10/07/2019   Procedure: CORONARY ARTERY BYPASS GRAFTING (CABG) using LIMA to LAD; Endoscopic harvesting of left greater saphenous vein: SVG to RAMUS; SVG  to OM1; SVG to PDA.;  Surgeon: Gaye Pollack, MD;  Location: [facility] OR;  Service: Open Heart Surgery;  Laterality: N/A;  . ENDARTERECTOMY Right 10/07/2019   Procedure: ENDARTERECTOMY CAROTID;  Surgeon: Rosetta Posner, MD;  Location[facility] OR;   Service: Vascular;  Laterality: Right;  . ENDOVEIN HARVEST OF GREATER SAPHENOUS VEIN Left 10/07/2019   Procedure: [place] Ropes Of Greater Saphenous Vein;  Surgeon: Gaye Pollack, MD;  Location: [facility] OR;  Service: Open Heart Surgery;  Laterality: Left;  . ESOPHAGOGASTRODUODENOSCOPY ENDOSCOPY    . KNEE ARTHROSCOPY  07/25/04   left  . [place]   right, open surgery- repair  . RIGHT/LEFT HEART CATH AND CORONARY ANGIOGRAPHY Bilateral 09/08/2019   Procedure: RIGHT/LEFT HEART CATH AND CORONARY ANGIOGRAPHY;  Surgeon: Minna Merritts, MD;  Location: [place] CV LAB;  Service: Cardiovascular;  Laterality: Bilateral;  . TEE WITHOUT CARDIOVERSION N/A 10/07/2019   Procedure: TRANSESOPHAGEAL ECHOCARDIOGRAM (TEE);  Surgeon: Gaye Pollack, MD;  Location: [place];  Service: Open Heart Surgery;  Laterality: N/A;  . 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 TOTAL KNEE ARTHROPLASTY Right 07/13/2017   Procedure: RIGHT TOTAL KNEE ARTHROPLASTY;  Surgeon: Gaynelle Arabian, MD;  [facility];  Service: Orthopedics;  Laterality: Right;    Current Outpatient Medications  Medication Sig Dispense Refill  . ALPRAZolam (XANAX) 0.25 MG tablet Take 1 tablet (0.25 mg total) by mouth 2 (two) times daily as needed for anxiety. 20 tablet 0  . atorvastatin (LIPITOR) 80 MG tablet Take 1 tablet (80 mg total) by mouth daily. 90 tablet 3  . clopidogrel (PLAVIX) 75 MG tablet Take 75 mg by mouth daily.    . fenofibrate 160 MG tablet Take 1 tablet (160 mg total) by mouth daily. 90 tablet 1  . furosemide (LASIX) 40 MG tablet TAKE 1 TABLET BY MOUTH TWICE A DAY 180 tablet 1  . glimepiride (AMARYL) 2 MG tablet TAKE 1/2 TABLET BY MOUTH IN THE MORNING 45 tablet 7  . glucose blood (TRUE METRIX BLOOD GLUCOSE TEST) test strip 1 each by Other route daily. Use as instructed 100 each 12  .  losartan (COZAAR) 50 MG tablet Take 1 tablet (50 mg total) by mouth daily. 90 tablet 3  . metFORMIN (GLUCOPHAGE) 1000 MG tablet Take 1 tablet (1,000 mg total) by mouth 2 (two) times daily. 180 tablet 1  . metoprolol tartrate (LOPRESSOR) 25 MG tablet TAKE 1 TABLET BY MOUTH TWICE A DAY 180 tablet 3  . nitroGLYCERIN (NITROSTAT) 0.4 MG SL tablet Place 0.4 mg under the tongue every 5 (five) minutes as needed for chest pain.    . pantoprazole (PROTONIX) 40 MG tablet Take 1 tablet (40 mg total) by mouth daily before breakfast. 90 tablet 3  . sildenafil (VIAGRA) 100 MG tablet Take 1 tablet (100 mg total) by mouth daily as needed for erectile dysfunction. 20 tablet 0  . spironolactone (ALDACTONE) 25 MG tablet Take 1 tablet (25 mg total) by mouth daily. 90 tablet 3  . traZODone (DESYREL) 50 MG tablet TAKE 1 TABLET BY MOUTH EVERY DAY 90 tablet 1   No current facility-administered medications for this visit.     Allergies:   Ramipril and Mucinex d [pseudoephedrine-guaifenesin er]   Social History:  The patient  reports that he quit smoking about a year ago. He has a 40.00 pack-year smoking  history. He has never used smokeless tobacco. He reports current alcohol use. He reports that he does not use drugs.   Family History:   family history includes Alcohol abuse in his father; Cancer in his brother; Colon cancer in his brother; Diabetes in his father and sister; Heart disease in his father and sister; Hypertension in his father.    Review of Systems: Review of Systems  Constitutional: Negative.   HENT: Negative.   Respiratory: Negative.   Cardiovascular: Negative.   Gastrointestinal: Negative.   Musculoskeletal: Negative.   Neurological: Negative.   Psychiatric/Behavioral: Negative.   All other systems reviewed and are negative.   PHYSICAL EXAM: VS:  BP 140/82 (BP Location: Left Arm, Patient Position: Sitting, Cuff Size: Normal)   Pulse 75   Ht 5\' 9"  (1.753 m)   Wt 249 lb (112.9 kg)   SpO2  94%   BMI 36.77 kg/m  , BMI Body mass index is 36.77 kg/m. GEN: Well nourished, well developed, in no acute distress  HEENT: normal  Neck: no JVD, carotid bruits, or masses Cardiac: RRR; no murmurs, rubs, or gallops, Trace edema left lower extremity below the knees, none on the right Respiratory: Coarse breath sounds GI: soft, nontender, nondistended, + BS MS: no deformity or atrophy  Skin: warm and dry, no rash Neuro:  Strength and sensation are intact Psych: euthymic mood, full affect  Recent Labs: 12/14/2019: B Natriuretic Peptide 292.6; Magnesium 2.1 06/12/2020: ALT 16; BUN 27; Creat 1.96; Hemoglobin 13.2; Platelets 307; Potassium 5.2; Sodium 142; TSH 1.66    Lipid Panel Lab Results  Component Value Date   CHOL 165 06/12/2020   HDL 34 (L) 06/12/2020   LDLCALC 100 (H) 06/12/2020   TRIG 185 (H) 06/12/2020      Wt Readings from Last 3 Encounters:  09/24/20 249 lb (112.9 kg)  06/18/20 241 lb 9.6 oz (109.6 kg)  03/21/20 239 lb 3.2 oz (108.5 kg)       ASSESSMENT AND PLAN:  Coronary artery disease of native artery of native heart with stable angina pectoris ([place]) bypass surgery 2021 Declined cardiac rehab, feels weak Stressed importance of smoking cessation, Importance of taking Lipitor  Uncontrolled type 2 diabetes mellitus with circulatory disorder causing erectile dysfunction (HCC) Recommended walking program, calorie restriction  Chronic diastolic CHF 5397 hospitalization, diuresed 30 pounds Recommend he stay on his Lasix at least daily, currently only taking as needed  Mixed hyperlipidemia Numbers above goal, stressed importance of taking Lipitor 80  Essential hypertension Blood pressure is well controlled on today's visit. No changes made to the medications.  DISORDER, TOBACCO USE Long discussion with him concerning his cigarettes, We have encouraged him to continue to work on weaning his cigarettes and smoking cessation. He will continue to work on this  and does not want any assistance with chantix.   Centrilobular emphysema (HCC)  COPD,  Smoking cessation recommended Needs follow-up pulmonary  Morbid obesity ([place]) We have encouraged continued exercise, careful diet management in an effort to lose weight.   Total encounter time more than 25 minutes  Greater than 50% was spent in counseling and coordination of care with the patient   Disposition:   F/U  6 months   Orders Placed This Encounter  Procedures  . EKG 12-Lead     Signed, Esmond Plants, M.D., Ph.D. 09/24/2020  [place], [place]

## 2020-09-24 ENCOUNTER — Other Ambulatory Visit: Payer: Self-pay | Admitting: *Deleted

## 2020-09-24 ENCOUNTER — Encounter: Payer: Self-pay | Admitting: Cardiovascular Disease

## 2020-09-24 ENCOUNTER — Ambulatory Visit (INDEPENDENT_AMBULATORY_CARE_PROVIDER_SITE_OTHER): Payer: Medicare Other | Admitting: Cardiovascular Disease

## 2020-09-24 ENCOUNTER — Other Ambulatory Visit: Payer: Self-pay

## 2020-09-24 VITALS — BP 140/82 | HR 75 | Ht 69.0 in | Wt 249.0 lb

## 2020-09-24 DIAGNOSIS — I5032 Chronic diastolic (congestive) heart failure: Secondary | ICD-10-CM | POA: Diagnosis not present

## 2020-09-24 DIAGNOSIS — I10 Essential (primary) hypertension: Secondary | ICD-10-CM | POA: Diagnosis not present

## 2020-09-24 DIAGNOSIS — I6521 Occlusion and stenosis of right carotid artery: Secondary | ICD-10-CM

## 2020-09-24 DIAGNOSIS — I2510 Atherosclerotic heart disease of native coronary artery without angina pectoris: Secondary | ICD-10-CM

## 2020-09-24 DIAGNOSIS — N183 Chronic kidney disease, stage 3 unspecified: Secondary | ICD-10-CM | POA: Diagnosis not present

## 2020-09-24 DIAGNOSIS — I480 Paroxysmal atrial fibrillation: Secondary | ICD-10-CM

## 2020-09-24 DIAGNOSIS — E785 Hyperlipidemia, unspecified: Secondary | ICD-10-CM | POA: Diagnosis not present

## 2020-09-24 DIAGNOSIS — I25118 Atherosclerotic heart disease of native coronary artery with other forms of angina pectoris: Secondary | ICD-10-CM

## 2020-09-24 IMAGING — DX DG CHEST 1V PORT
1 series · 1 of 1 positions shown · non-contrast
Comparison: 10/08/2019

CLINICAL DATA: Open heart surgery

EXAM:
PORTABLE CHEST 1 VIEW

[chest]
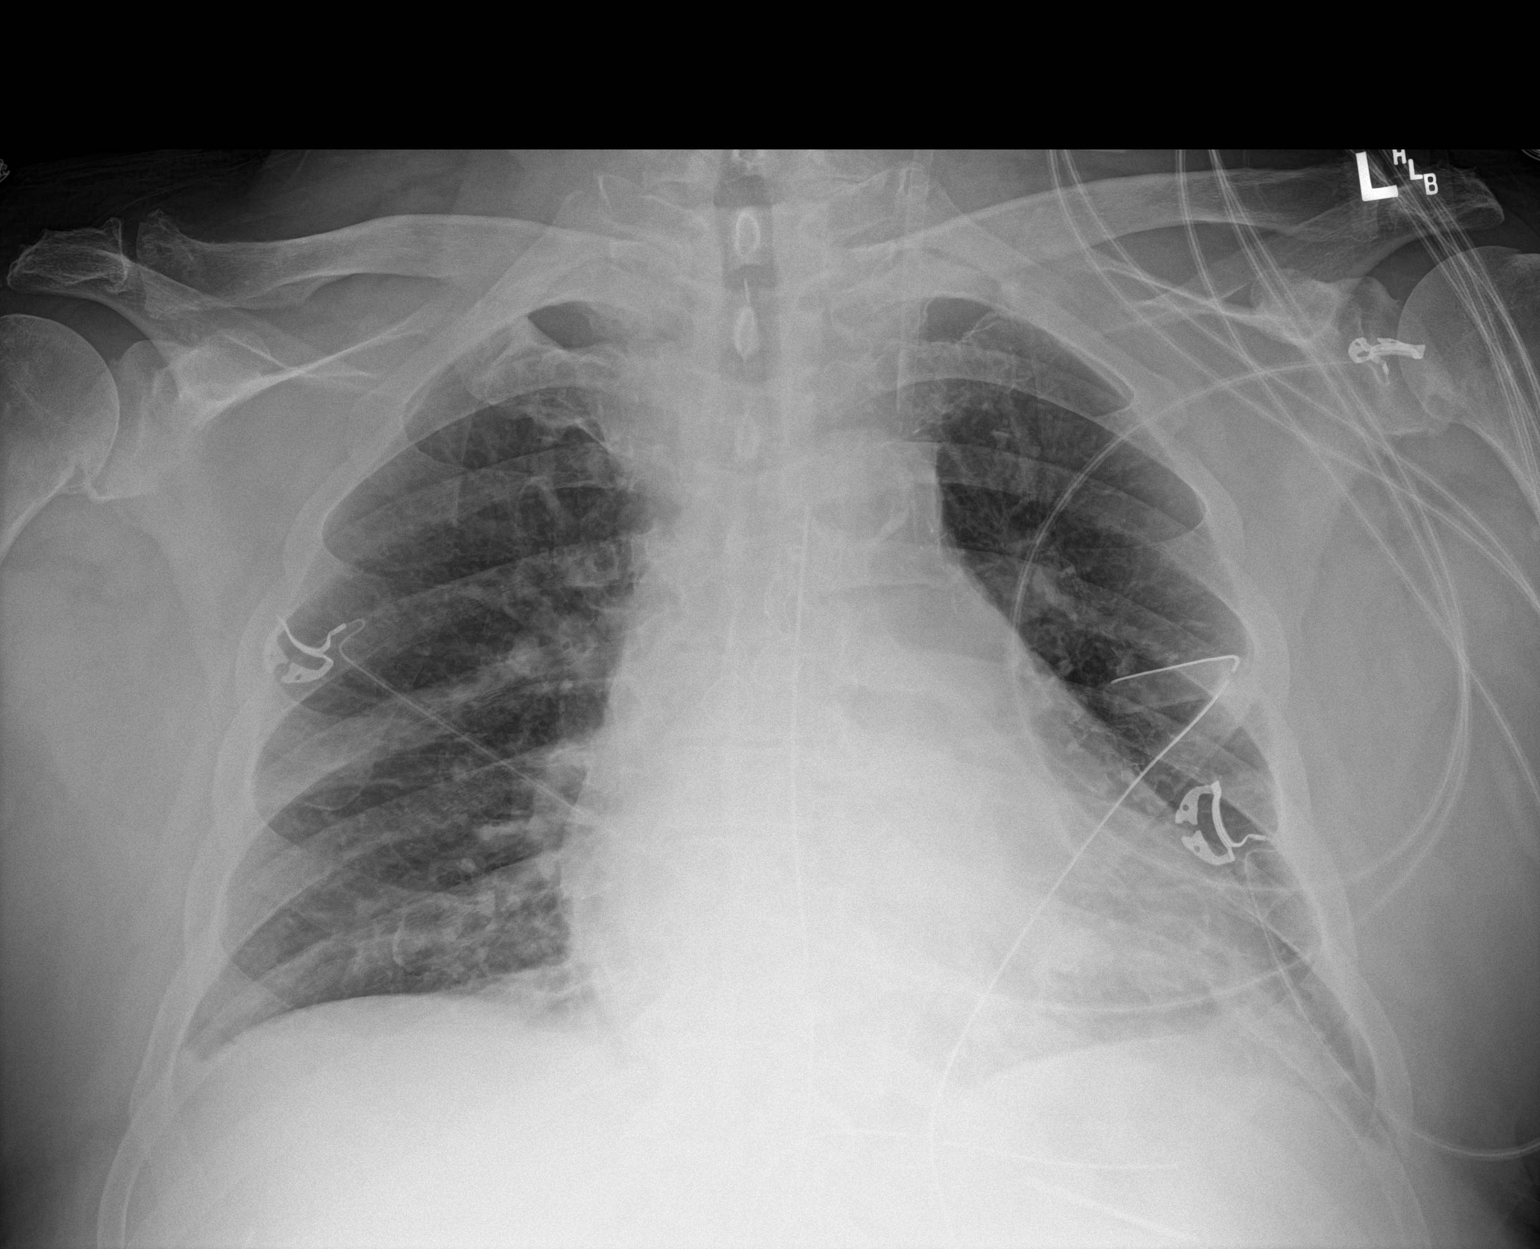

[1 of 1 positions shown; findings below may reference images not displayed]

FINDINGS: Interval extubation and removal of enteric tube.

Stable left chest tube and mediastinal drains. Although equivocal, a
tiny medial left apical pneumothorax is possible.

Mild left basilar atelectasis. Lungs are otherwise clear. No pleural
effusions.

The heart is normal in size. Postsurgical changes related to prior
CABG.

Left IJ venous sheath.
IMPRESSION: Interval extubation and removal of enteric tube.

Left chest tube and mediastinal drains. Although equivocal, a tiny
medial left apical pneumothorax is possible. Attention on follow-up
is suggested.

## 2020-09-24 MED ORDER — FENOFIBRATE 160 MG PO TABS: 160.0000 mg | ORAL_TABLET | Freq: Every day | ORAL | 1 refills | Status: DC

## 2020-09-24 NOTE — Patient Instructions (Addendum)
Monitor oxygen when walking, 89%  or below may qualify you for oxygen   Medication Instructions:  No changes Don't forget the atorvastatin  If you need a refill on your cardiac medications before your next appointment, please call your pharmacy.    Lab work: No new labs needed   If you have labs (blood work) drawn today and your tests are completely normal, you will receive your results only by: Marland Kitchen MyChart Message (if you have MyChart) OR . A paper copy in the mail If you have any lab test that is abnormal or we need to change your treatment, we will call you to review the results.   Testing/Procedures: No new testing needed   Follow-Up: At [facility], you and your health needs are our priority.  As part of our continuing mission to provide you with exceptional heart care, we have created designated Provider Care Teams.  These Care Teams include your primary Cardiologist (physician) and Advanced Practice Providers (APPs -  Physician Assistants and Nurse Practitioners) who all work together to provide you with the care you need, when you need it.  . You will need a follow up appointment in 6 months  . Providers on your designated Care Team:   . Murray Hodgkins, NP . Christell Faith, PA-C . Marrianne Mood, PA-C  Any Other Special Instructions Will Be Listed Below (If Applicable).  COVID-19 Vaccine Information can be found at: [email or web address] For questions related to vaccine distribution or appointments, please email [email or web address] or call [phone].

## 2020-09-25 IMAGING — DX DG CHEST 1V PORT
1 series · 1 of 1 positions shown · non-contrast
Comparison: Portable exam 3663 hours compared to 10/09/2019

CLINICAL DATA: Chest soreness post CABG

EXAM:
PORTABLE CHEST 1 VIEW

[chest ap]
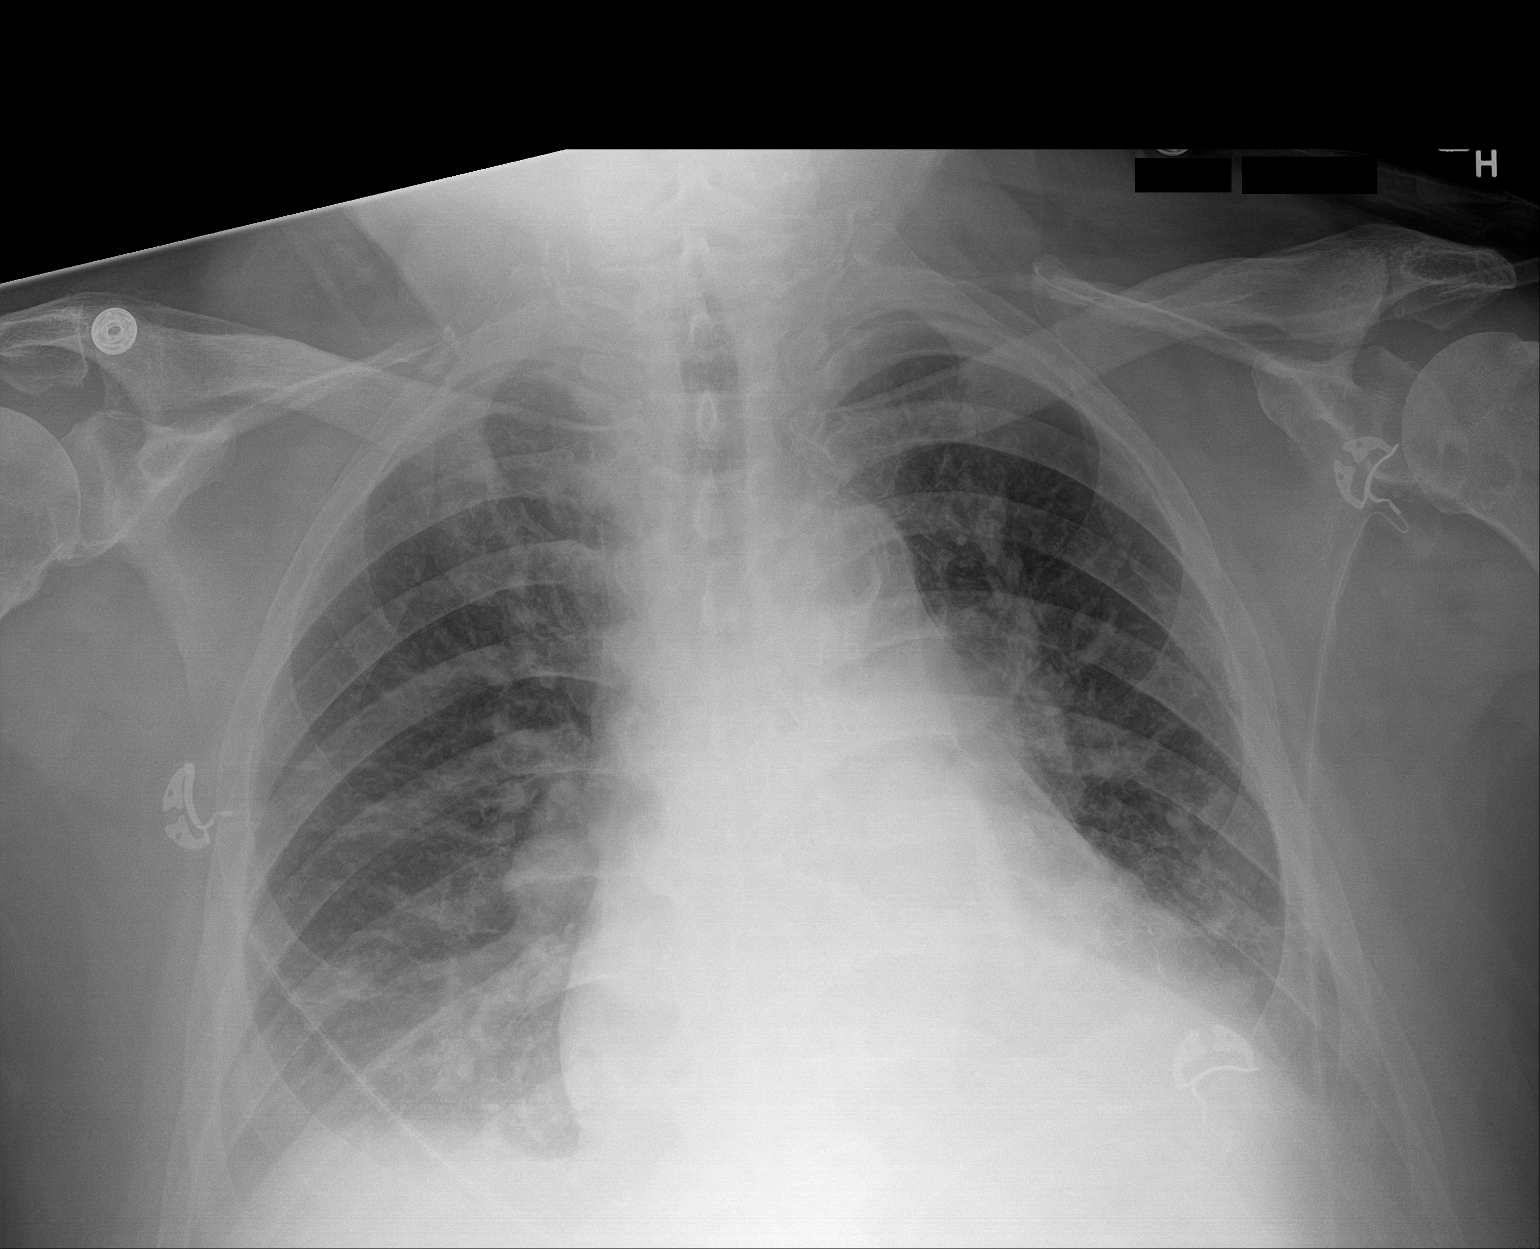

[1 of 1 positions shown; findings below may reference images not displayed]

FINDINGS: Interval removal of LEFT jugular line, LEFT thoracostomy tube, and
mediastinal drain.

Enlargement of cardiac silhouette with pulmonary vascular
congestion.

Atherosclerotic calcification aorta.

Bibasilar atelectasis.

No definite infiltrate, pleural effusion or pneumothorax.
IMPRESSION: Bibasilar atelectasis.

## 2020-09-27 ENCOUNTER — Other Ambulatory Visit: Payer: Self-pay | Admitting: *Deleted

## 2020-09-27 MED ORDER — FENOFIBRATE 160 MG PO TABS: 160.0000 mg | ORAL_TABLET | Freq: Every day | ORAL | 1 refills | Status: DC

## 2020-09-28 ENCOUNTER — Other Ambulatory Visit: Payer: Self-pay | Admitting: *Deleted

## 2020-10-03 ENCOUNTER — Other Ambulatory Visit (HOSPITAL_COMMUNITY): Payer: Self-pay

## 2020-10-03 MED ORDER — SPIRONOLACTONE 25 MG PO TABS: 25.0000 mg | ORAL_TABLET | Freq: Every day | ORAL | 0 refills | Status: DC

## 2020-10-06 ENCOUNTER — Other Ambulatory Visit: Payer: Self-pay | Admitting: Internal Medicine

## 2020-10-06 DIAGNOSIS — E785 Hyperlipidemia, unspecified: Secondary | ICD-10-CM

## 2020-10-09 ENCOUNTER — Other Ambulatory Visit: Payer: Self-pay | Admitting: *Deleted

## 2020-10-09 MED ORDER — SPIRONOLACTONE 25 MG PO TABS: 25.0000 mg | ORAL_TABLET | Freq: Every day | ORAL | 0 refills | Status: DC

## 2020-10-18 IMAGING — CR DG CHEST 2V
2 series · 2 of 2 positions shown · non-contrast
Comparison: 10/10/2019

CLINICAL DATA: History of CABG

EXAM:
CHEST - 2 VIEW

[w chest pa]
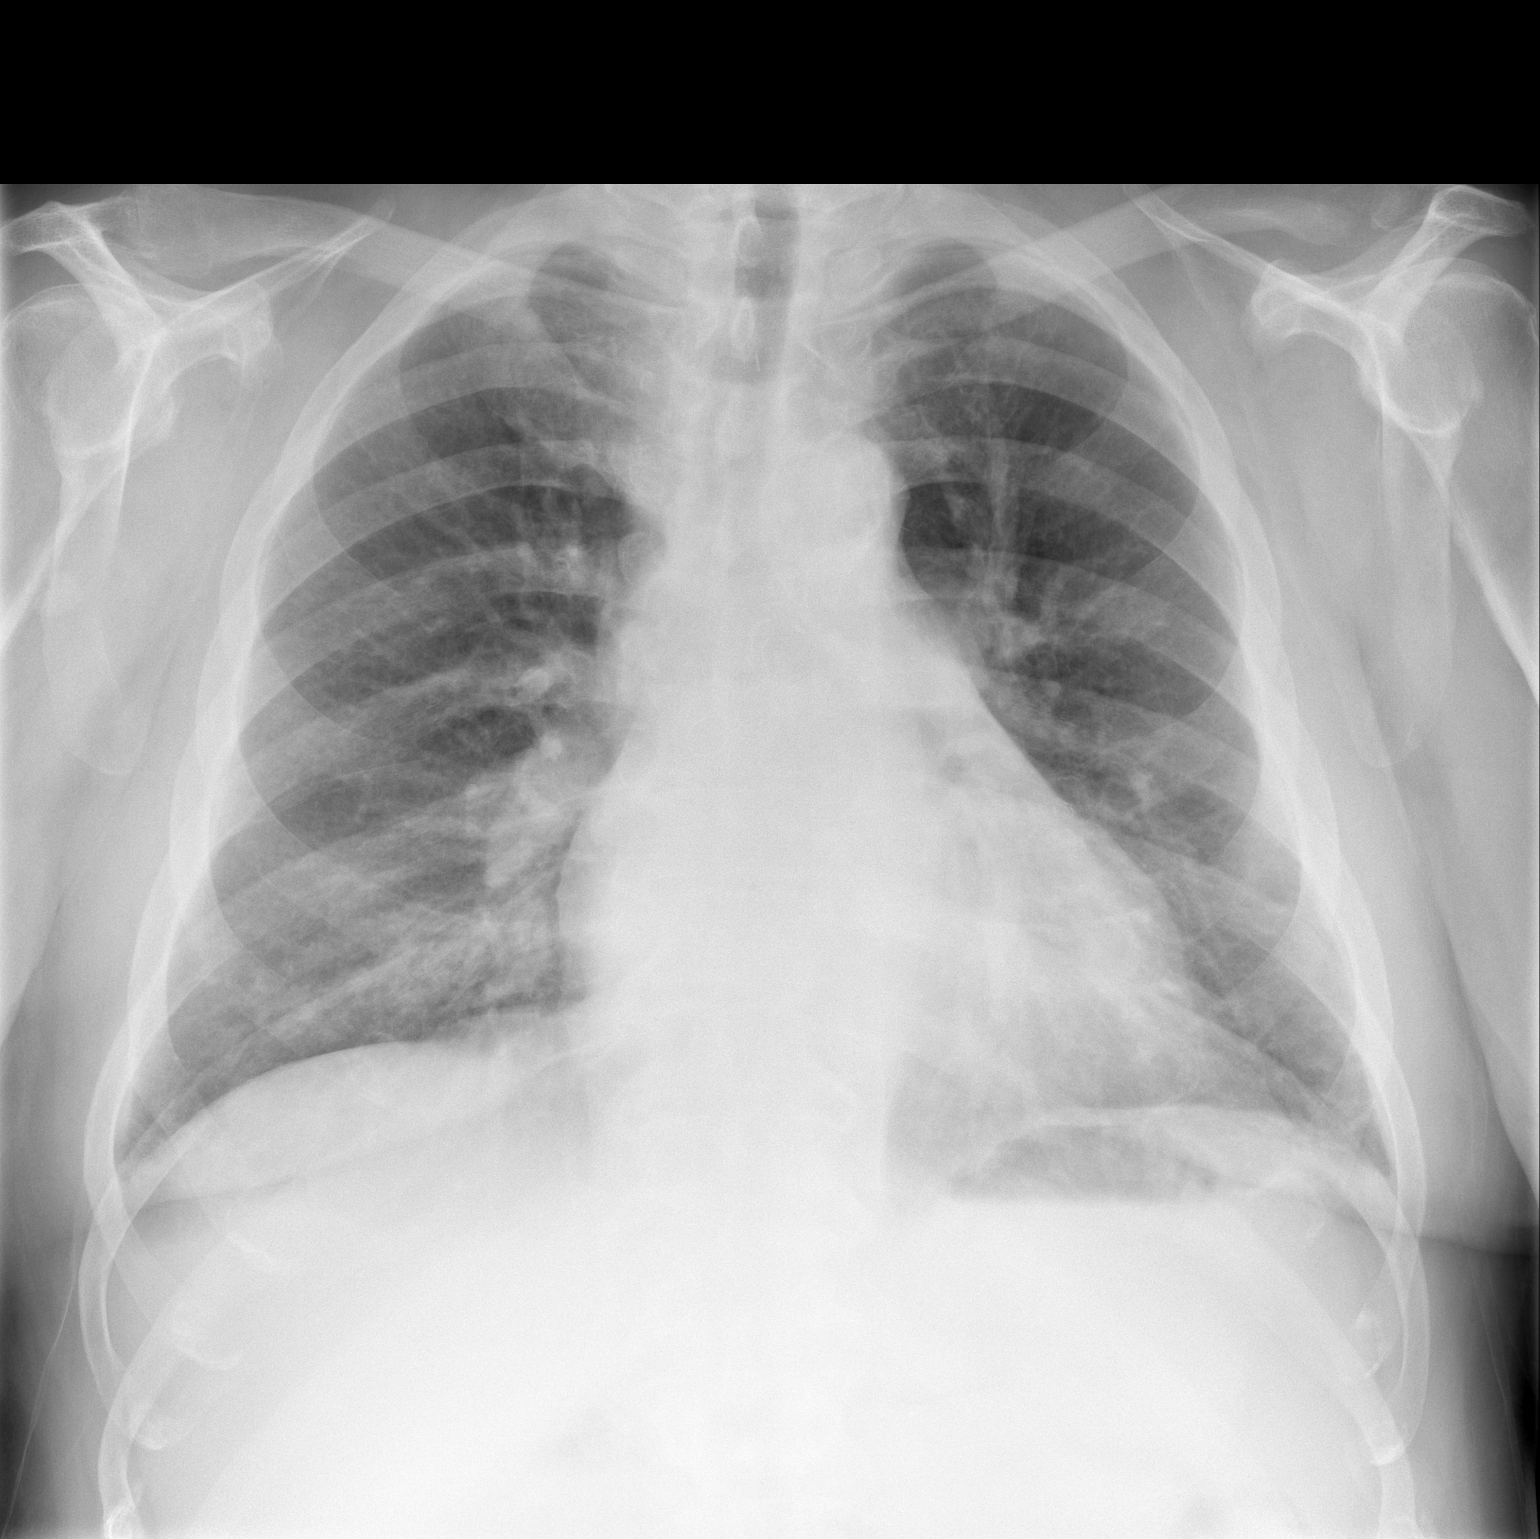

[w chest lat]
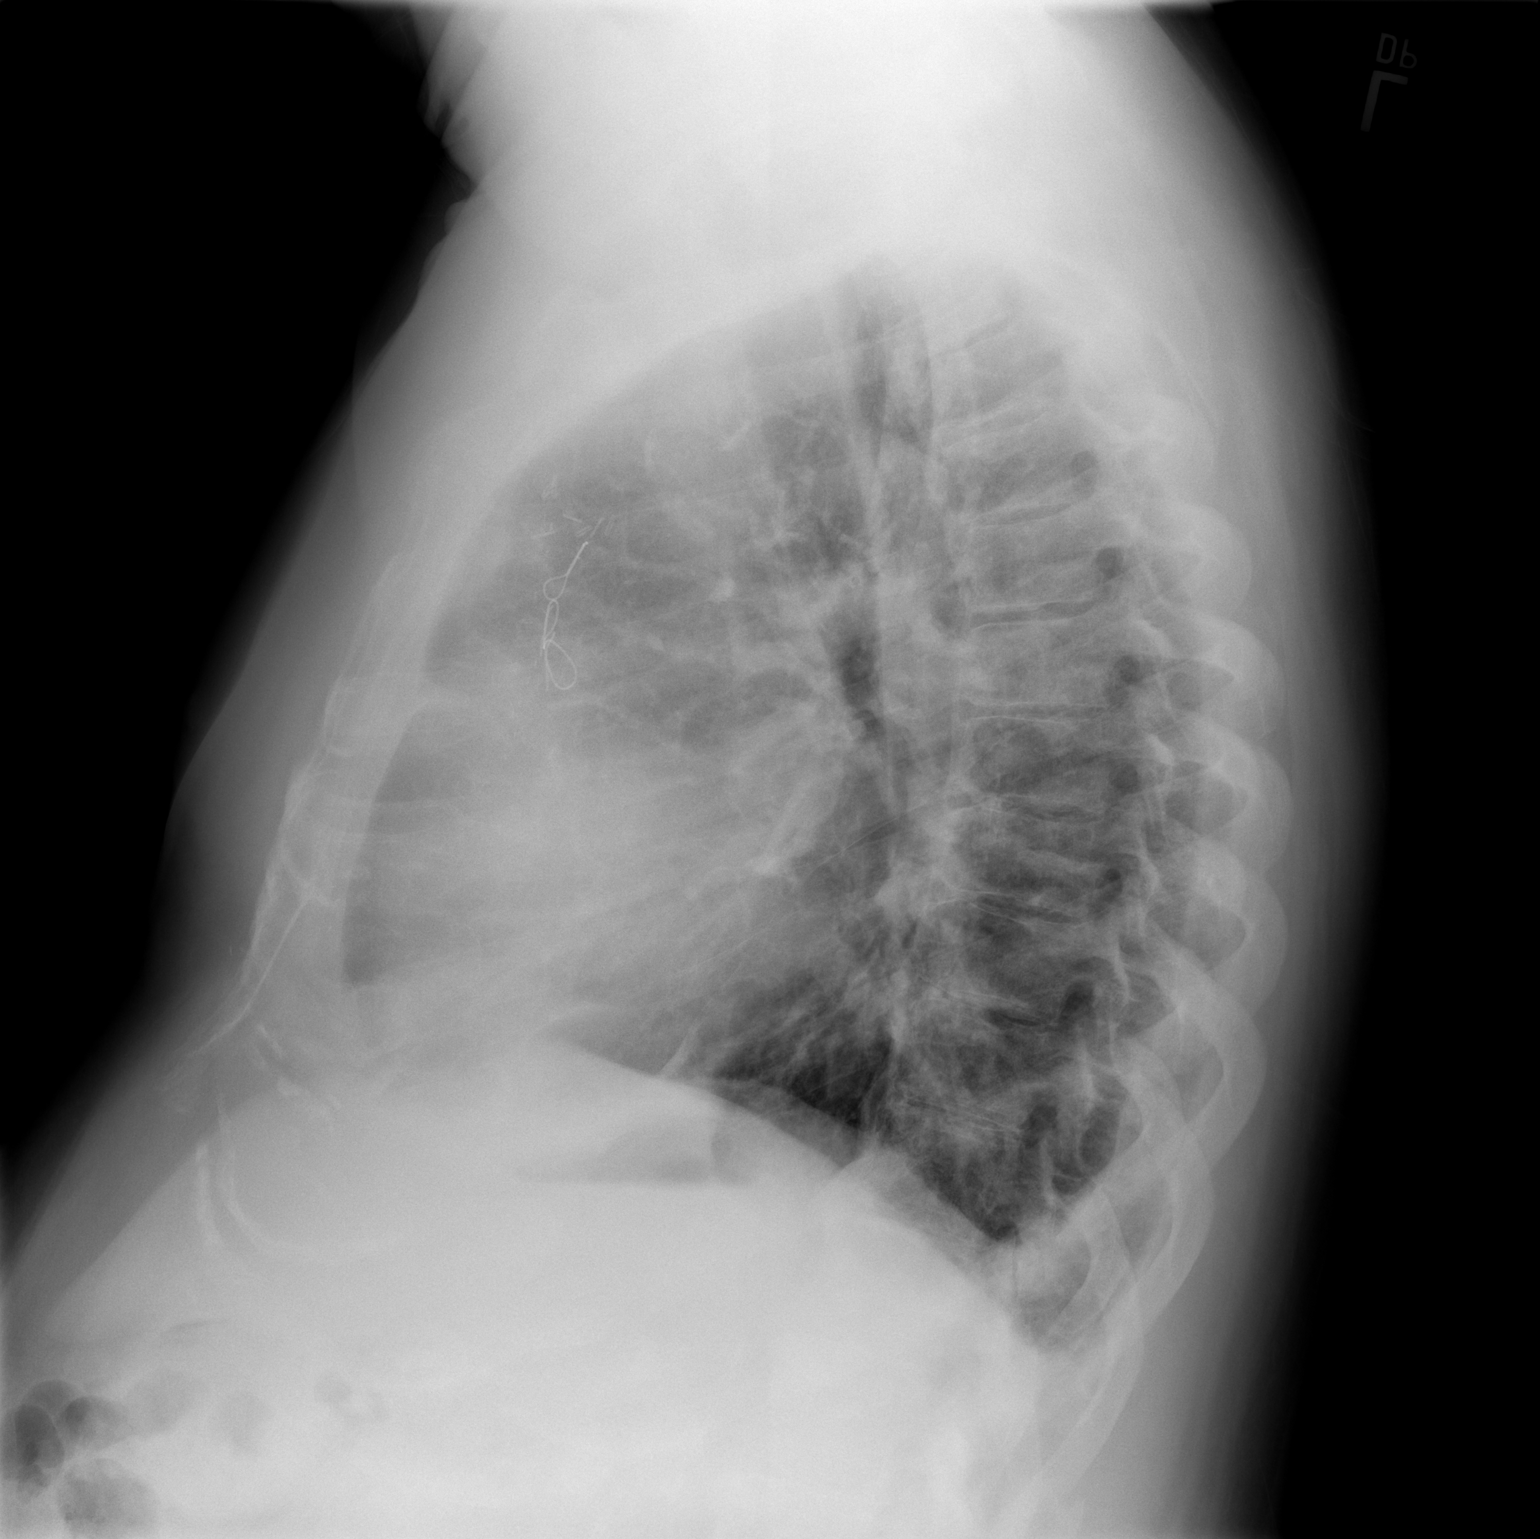

[2 of 2 positions shown; findings below may reference images not displayed]

FINDINGS: Mild cardiomegaly. Mild, diffuse interstitial pulmonary opacity.
Disc degenerative disease of the thoracic spine.
IMPRESSION: Mild cardiomegaly with mild, diffuse interstitial pulmonary opacity,
likely edema. No new or focal airspace opacity.

## 2020-10-25 DIAGNOSIS — E1122 Type 2 diabetes mellitus with diabetic chronic kidney disease: Secondary | ICD-10-CM | POA: Diagnosis not present

## 2020-10-25 DIAGNOSIS — N1832 Chronic kidney disease, stage 3b: Secondary | ICD-10-CM | POA: Diagnosis not present

## 2020-10-25 DIAGNOSIS — I10 Essential (primary) hypertension: Secondary | ICD-10-CM | POA: Diagnosis not present

## 2020-10-25 DIAGNOSIS — N2581 Secondary hyperparathyroidism of renal origin: Secondary | ICD-10-CM | POA: Diagnosis not present

## 2020-10-26 LAB — HM DIABETES EYE EXAM

## 2020-10-29 ENCOUNTER — Other Ambulatory Visit: Payer: Self-pay

## 2020-10-29 ENCOUNTER — Other Ambulatory Visit (INDEPENDENT_AMBULATORY_CARE_PROVIDER_SITE_OTHER): Payer: Medicare Other

## 2020-10-29 DIAGNOSIS — E1159 Type 2 diabetes mellitus with other circulatory complications: Secondary | ICD-10-CM | POA: Diagnosis not present

## 2020-10-29 DIAGNOSIS — N521 Erectile dysfunction due to diseases classified elsewhere: Secondary | ICD-10-CM | POA: Diagnosis not present

## 2020-10-29 DIAGNOSIS — E1165 Type 2 diabetes mellitus with hyperglycemia: Secondary | ICD-10-CM

## 2020-10-29 DIAGNOSIS — IMO0002 Reserved for concepts with insufficient information to code with codable children: Secondary | ICD-10-CM

## 2020-10-29 LAB — POCT GLYCOSYLATED HEMOGLOBIN (HGB A1C): HbA1c POC (<> result, manual entry): 7 % (ref 4.0–5.6)

## 2020-10-31 ENCOUNTER — Other Ambulatory Visit: Payer: Self-pay | Admitting: Internal Medicine

## 2020-10-31 DIAGNOSIS — E785 Hyperlipidemia, unspecified: Secondary | ICD-10-CM

## 2020-11-05 ENCOUNTER — Ambulatory Visit (INDEPENDENT_AMBULATORY_CARE_PROVIDER_SITE_OTHER): Payer: Medicare Other | Admitting: Family Medicine

## 2020-11-05 ENCOUNTER — Encounter: Payer: Self-pay | Admitting: Family Medicine

## 2020-11-05 VITALS — BP 120/63 | HR 72 | Ht 70.0 in | Wt 249.8 lb

## 2020-11-05 DIAGNOSIS — I10 Essential (primary) hypertension: Secondary | ICD-10-CM

## 2020-11-05 DIAGNOSIS — N521 Erectile dysfunction due to diseases classified elsewhere: Secondary | ICD-10-CM

## 2020-11-05 DIAGNOSIS — IMO0002 Reserved for concepts with insufficient information to code with codable children: Secondary | ICD-10-CM

## 2020-11-05 DIAGNOSIS — I6521 Occlusion and stenosis of right carotid artery: Secondary | ICD-10-CM | POA: Diagnosis not present

## 2020-11-05 DIAGNOSIS — E1159 Type 2 diabetes mellitus with other circulatory complications: Secondary | ICD-10-CM

## 2020-11-05 DIAGNOSIS — E1165 Type 2 diabetes mellitus with hyperglycemia: Secondary | ICD-10-CM | POA: Diagnosis not present

## 2020-11-05 NOTE — Progress Notes (Addendum)
Established Patient Office Visit  SUBJECTIVE:  Subjective  Patient ID: Joshua Roy, male    DOB: 31-Jan-1951  Age: 70 y.o. MRN: [id number]  CC:  Chief Complaint  Patient presents with  . Diabetes    HPI Joshua Roy is a 70 y.o. male presenting today for     Past Medical History:  Diagnosis Date  . (HFpEF) heart failure with preserved ejection fraction ([place])    a. 07/2019 Echo: EF 60-65%. Mod LVH. Gr1 DD. No rwma. Nl RV size/fxn. Mildly dil LA; b. 11/2019 Echo: EF 55-60%, no rwma, mild LVH. Nl RV size/fxn. Mod dil LA.  Marland Kitchen ACE-inhibitor cough   . Anxiety   . Bronchitis 06/27/2016  . Carotid arterial disease ([place])    a. 09/2019 Carotid U/S: RICA 93-23%, LICA 5-57%; b. 09/2200 R CEA.  . CKD (chronic kidney disease), stage III ([place])   . COPD (chronic obstructive pulmonary disease) (HCC)    cath, mild inf. hypokinesis EF 49%, 100% ROA  . Coronary artery disease    a. 2001 Cath: LM 10, LAD 60p/m, D1 30, LCX 20p, RCA 163m/d-->Med Rx; b. 05/2017 Ex Mv: Ex time 5:46, antlat twi @ rest, fixed infarct w/ mild peri-infarct ischemia. EF 38%; c. 08/2019 Cath: LM 40ost, 70d, LAD 24m, LCX 53m, RCA 80-90p, 170m; d. 09/2019 CABG x4: LIMA->LAD, VG->RI, VG->OM, VG->RPDA.  . Diabetes mellitus type II   . Hyperlipidemia   . Hypertension   . MI (myocardial infarction) ([place]) age 54  . OA (osteoarthritis)    knee OA, injected 2012 by ortho  . PAF (paroxysmal atrial fibrillation) ([place])    a. 09/2019 post-op CABG-->Amio.  . Pneumonia   . PSVT (paroxysmal supraventricular tachycardia) ([place])    a. 08/2019 Zio: 189 brief runs of SVT (fastest 174 x 6 beats, longest 15 beats @ 107).  . Tobacco abuse     Past Surgical History:  Procedure Laterality Date  . CARDIAC CATHETERIZATION  05/06/2000   @ [place]  . COLONOSCOPY WITH PROPOFOL N/A 04/20/2018   Procedure: COLONOSCOPY WITH PROPOFOL;  Surgeon: [name], MD;  Location[facility];  Service: Endoscopy;  Laterality: N/A;  . CORONARY  ARTERY BYPASS GRAFT N/A 10/07/2019   Procedure: CORONARY ARTERY BYPASS GRAFTING (CABG) using LIMA to LAD; Endoscopic harvesting of left greater saphenous vein: SVG to RAMUS; SVG to OM1; SVG to PDA.;  Surgeon: Gaye Pollack, MD;  Location: [place];  Service: Open Heart Surgery;  Laterality: N/A;  . ENDARTERECTOMY Right 10/07/2019   Procedure: ENDARTERECTOMY CAROTID;  Surgeon: Rosetta Posner, MD;  Location: [facility] OR;  Service: Vascular;  Laterality: Right;  . ENDOVEIN HARVEST OF GREATER SAPHENOUS VEIN Left 10/07/2019   Procedure: [place] Of Greater Saphenous Vein;  Surgeon: Gaye Pollack, MD;  Location: [facility] OR;  Service: Open Heart Surgery;  Laterality: Left;  . ESOPHAGOGASTRODUODENOSCOPY ENDOSCOPY    . KNEE ARTHROSCOPY  07/25/04   left  . [place]   right, open surgery- repair  . RIGHT/LEFT HEART CATH AND CORONARY ANGIOGRAPHY Bilateral 09/08/2019   Procedure: RIGHT/LEFT HEART CATH AND CORONARY ANGIOGRAPHY;  Surgeon: Minna Merritts, MD;  Location: [place] [facility];  Service: Cardiovascular;  Laterality: Bilateral;  . TEE WITHOUT CARDIOVERSION N/A 10/07/2019   Procedure: TRANSESOPHAGEAL ECHOCARDIOGRAM (TEE);  Surgeon: Gaye Pollack, MD;  Location: [facility];  Service: Open Heart Surgery;  Laterality: N/A;  . TOTAL KNEE ARTHROPLASTY Left 07/14/2016   Procedure: LEFT TOTAL KNEE ARTHROPLASTY;  Surgeon: Gaynelle Arabian,  MD;  Location: [facility];  Service: Orthopedics;  Laterality: Left;  . TOTAL KNEE ARTHROPLASTY Right 07/13/2017   Procedure: RIGHT TOTAL KNEE ARTHROPLASTY;  Surgeon: Gaynelle Arabian, MD;  Location: [facility] OR[facility];  Service: Orthopedics;  Laterality: Right;    Family History  Problem Relation Age of Onset  . Heart disease Father        CAD, MI x 3  . Hypertension Father   . Alcohol abuse Father   . Diabetes Father   . Diabetes Sister   . Cancer Brother        lymphoma, in remission  . Colon cancer Brother   . Heart disease Sister        CABG x 3  . Prostate cancer Neg Hx      Social History   Socioeconomic History  . Marital status: Married    Spouse name: [name]  . Number of children: 3  . Years of education: Not on file  . Highest education level: Not on file  Occupational History  . Occupation: Engineer, manufacturing, Estate agent  Tobacco Use  . Smoking status: Former Smoker    Packs/day: 1.00    Years: 40.00    Pack years: 40.00    Quit date: 10/07/2019    Years since quitting: 1.2  . Smokeless tobacco: Never Used  . Tobacco comment: per patient wears a patch and has cut down on cigarettes/day (10/05/2019)  Vaping Use  . Vaping Use: Never used  Substance and Sexual Activity  . Alcohol use: Yes    Alcohol/week: 0.0 standard drinks    Comment: 5-6 cocktails, 1 times a week  . Drug use: No  . Sexual activity: Not on file  Other Topics Concern  . Not on file  Social History Narrative   From [place].  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   Social Determinants of Health   Financial Resource Strain: Not on file  Food Insecurity: No Food Insecurity  . Worried About Charity fundraiser in the Last Year: Never true  . Ran Out of Food in the Last Year: Never true  Transportation Needs: No Transportation Needs  . Lack of Transportation (Medical): No  . Lack of Transportation (Non-Medical): No  Physical Activity: Not on file  Stress: Not on file  Social Connections: Not on file  Intimate Partner Violence: Not on file     Current Outpatient Medications:  .  ALPRAZolam (XANAX) 0.25 MG tablet, Take 1 tablet (0.25 mg total) by mouth 2 (two) times daily as needed for anxiety., Disp: 20 tablet, Rfl: 0 .  atorvastatin (LIPITOR) 80 MG tablet, Take 1 tablet (80 mg total) by mouth daily., Disp: 90 tablet, Rfl: 3 .  clopidogrel (PLAVIX) 75 MG tablet, TAKE 1 TABLET BY MOUTH EVERY DAY, Disp: 90 tablet, Rfl: 3 .  fenofibrate 160 MG tablet, Take 1 tablet (160 mg total) by mouth daily., Disp: 90 tablet, Rfl: 1 .  furosemide (LASIX) 40 MG tablet, TAKE 1  TABLET BY MOUTH TWICE A DAY, Disp: 180 tablet, Rfl: 1 .  glimepiride (AMARYL) 2 MG tablet, TAKE 1/2 TABLET BY MOUTH IN THE MORNING, Disp: 45 tablet, Rfl: 7 .  glucose blood (TRUE METRIX BLOOD GLUCOSE TEST) test strip, 1 each by Other route daily. Use as instructed, Disp: 100 each, Rfl: 12 .  metFORMIN (GLUCOPHAGE) 1000 MG tablet, TAKE 1 TABLET BY MOUTH TWICE A DAY, Disp: 180 tablet, Rfl: 6 .  metoprolol tartrate (LOPRESSOR) 25 MG tablet, TAKE 1  TABLET BY MOUTH TWICE A DAY, Disp: 180 tablet, Rfl: 3 .  nitroGLYCERIN (NITROSTAT) 0.4 MG SL tablet, Place 0.4 mg under the tongue every 5 (five) minutes as needed for chest pain., Disp: , Rfl:  .  pantoprazole (PROTONIX) 40 MG tablet, Take 1 tablet (40 mg total) by mouth daily before breakfast., Disp: 90 tablet, Rfl: 3 .  sildenafil (VIAGRA) 100 MG tablet, Take 1 tablet (100 mg total) by mouth daily as needed for erectile dysfunction., Disp: 20 tablet, Rfl: 0 .  traZODone (DESYREL) 50 MG tablet, TAKE 1 TABLET BY MOUTH EVERY DAY, Disp: 90 tablet, Rfl: 1 .  losartan (COZAAR) 50 MG tablet, Take 1 tablet (50 mg total) by mouth daily., Disp: 90 tablet, Rfl: 3 .  spironolactone (ALDACTONE) 25 MG tablet, Take 1 tablet (25 mg total) by mouth daily., Disp: 30 tablet, Rfl: 0   Allergies  Allergen Reactions  . Ramipril Cough  . Mucinex D [Pseudoephedrine-Guaifenesin Er] Other (See Comments) and Hypertension    Elevated BP, insomnia    ROS Review of Systems  Constitutional: Negative.   HENT: Negative.   Respiratory: Negative.   Gastrointestinal: Negative.   Genitourinary: Negative.   Psychiatric/Behavioral: Negative.      OBJECTIVE:    Physical Exam Vitals and nursing note reviewed.  Constitutional:      Appearance: He is obese.  HENT:     Right Ear: Tympanic membrane normal.     Left Ear: Tympanic membrane normal.     Mouth/Throat:     Mouth: Mucous membranes are moist.  Cardiovascular:     Rate and Rhythm: Normal rate and regular rhythm.   Musculoskeletal:        General: Normal range of motion.  Skin:    Capillary Refill: Capillary refill takes less than 2 seconds.  Neurological:     Mental Status: He is alert and oriented to person, place, and time.  Psychiatric:        Mood and Affect: Mood normal.     BP 120/63   Pulse 72   Ht 5\' 10"  (1.778 m)   Wt 249 lb 12.8 oz (113.3 kg)   BMI 35.84 kg/m  Wt Readings from Last 3 Encounters:  11/05/20 249 lb 12.8 oz (113.3 kg)  09/24/20 249 lb (112.9 kg)  06/18/20 241 lb 9.6 oz (109.6 kg)    Health Maintenance Due  Topic Date Due  . COVID-19 Vaccine (1) Never done  . Hepatitis C Screening  Never done  . Zoster Vaccines- Shingrix (1 of 2) Never done  . FOOT EXAM  11/21/2015  . PNA vac Low Risk Adult (2 of 2 - PPSV23) 07/06/2018    There are no preventive care reminders to display for this patient.  CBC Latest Ref Rng & Units 06/12/2020 12/14/2019 12/13/2019  WBC 3.8 - 10.8 Thousand/uL 8.5 8.6 8.3  Hemoglobin 13.2 - 17.1 g/dL 13.2 11.3(L) 10.8(L)  Hematocrit 38.5 - 50.0 % 40.4 38.3(L) 38.1(L)  Platelets 140 - 400 Thousand/uL 307 293 282   CMP Latest Ref Rng & Units 06/12/2020 12/14/2019 12/13/2019  Glucose 65 - 99 mg/dL 100(H) 155(H) 307(H)  BUN 7 - 25 mg/dL 27(H) 51(H) 45(H)  Creatinine 0.70 - 1.18 mg/dL 1.96(H) 1.74(H) 1.79(H)  Sodium 135 - 146 mmol/L 142 137 135  Potassium 3.5 - 5.3 mmol/L 5.2 3.9 4.6  Chloride 98 - 110 mmol/L 102 91(L) 91(L)  CO2 20 - 32 mmol/L 23 36(H) 36(H)  Calcium 8.6 - 10.3 mg/dL 9.7 9.2 9.2  Total  Protein 6.1 - 8.1 g/dL 6.8 - -  Total Bilirubin 0.2 - 1.2 mg/dL 0.3 - -  Alkaline Phos 38 - 126 U/L - - -  AST 10 - 35 U/L 17 - -  ALT 9 - 46 U/L 16 - -    Lab Results  Component Value Date   TSH 1.66 06/12/2020   Lab Results  Component Value Date   ALBUMIN 3.8 12/07/2019   ANIONGAP 10 12/14/2019   GFR 65.78 11/15/2014   Lab Results  Component Value Date   CHOL 165 06/12/2020   CHOL 84 10/11/2019   CHOL 155 11/15/2014   HDL 34  (L) 06/12/2020   HDL 26 (L) 10/11/2019   HDL 35.00 (L) 11/15/2014   LDLCALC 100 (H) 06/12/2020   LDLCALC 39 10/11/2019   LDLCALC 93 09/26/2013   CHOLHDL 4.9 06/12/2020   CHOLHDL 3.2 10/11/2019   CHOLHDL 4 11/15/2014   Lab Results  Component Value Date   TRIG 185 (H) 06/12/2020   Lab Results  Component Value Date   HGBA1C 7.0 10/29/2020   HGBA1C 6.9 (H) 06/12/2020   HGBA1C 7.4 (H) 12/07/2019      ASSESSMENT & PLAN:   Problem List Items Addressed This Visit      Cardiovascular and Mediastinum   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     Follow-up: No follow-ups on file.    Beckie Salts, [place] [address], [place], [place] [address]

## 2020-11-07 ENCOUNTER — Other Ambulatory Visit (HOSPITAL_COMMUNITY): Payer: Self-pay

## 2020-11-07 MED ORDER — SPIRONOLACTONE 25 MG PO TABS: 25.0000 mg | ORAL_TABLET | Freq: Every day | ORAL | 0 refills | Status: DC

## 2020-11-22 IMAGING — DX DG CHEST 1V
1 series · 1 of 1 positions shown · non-contrast
Comparison: Film from earlier in the same day.

CLINICAL DATA: Increasing shortness of breath

EXAM:
CHEST  1 VIEW

[chest ap]
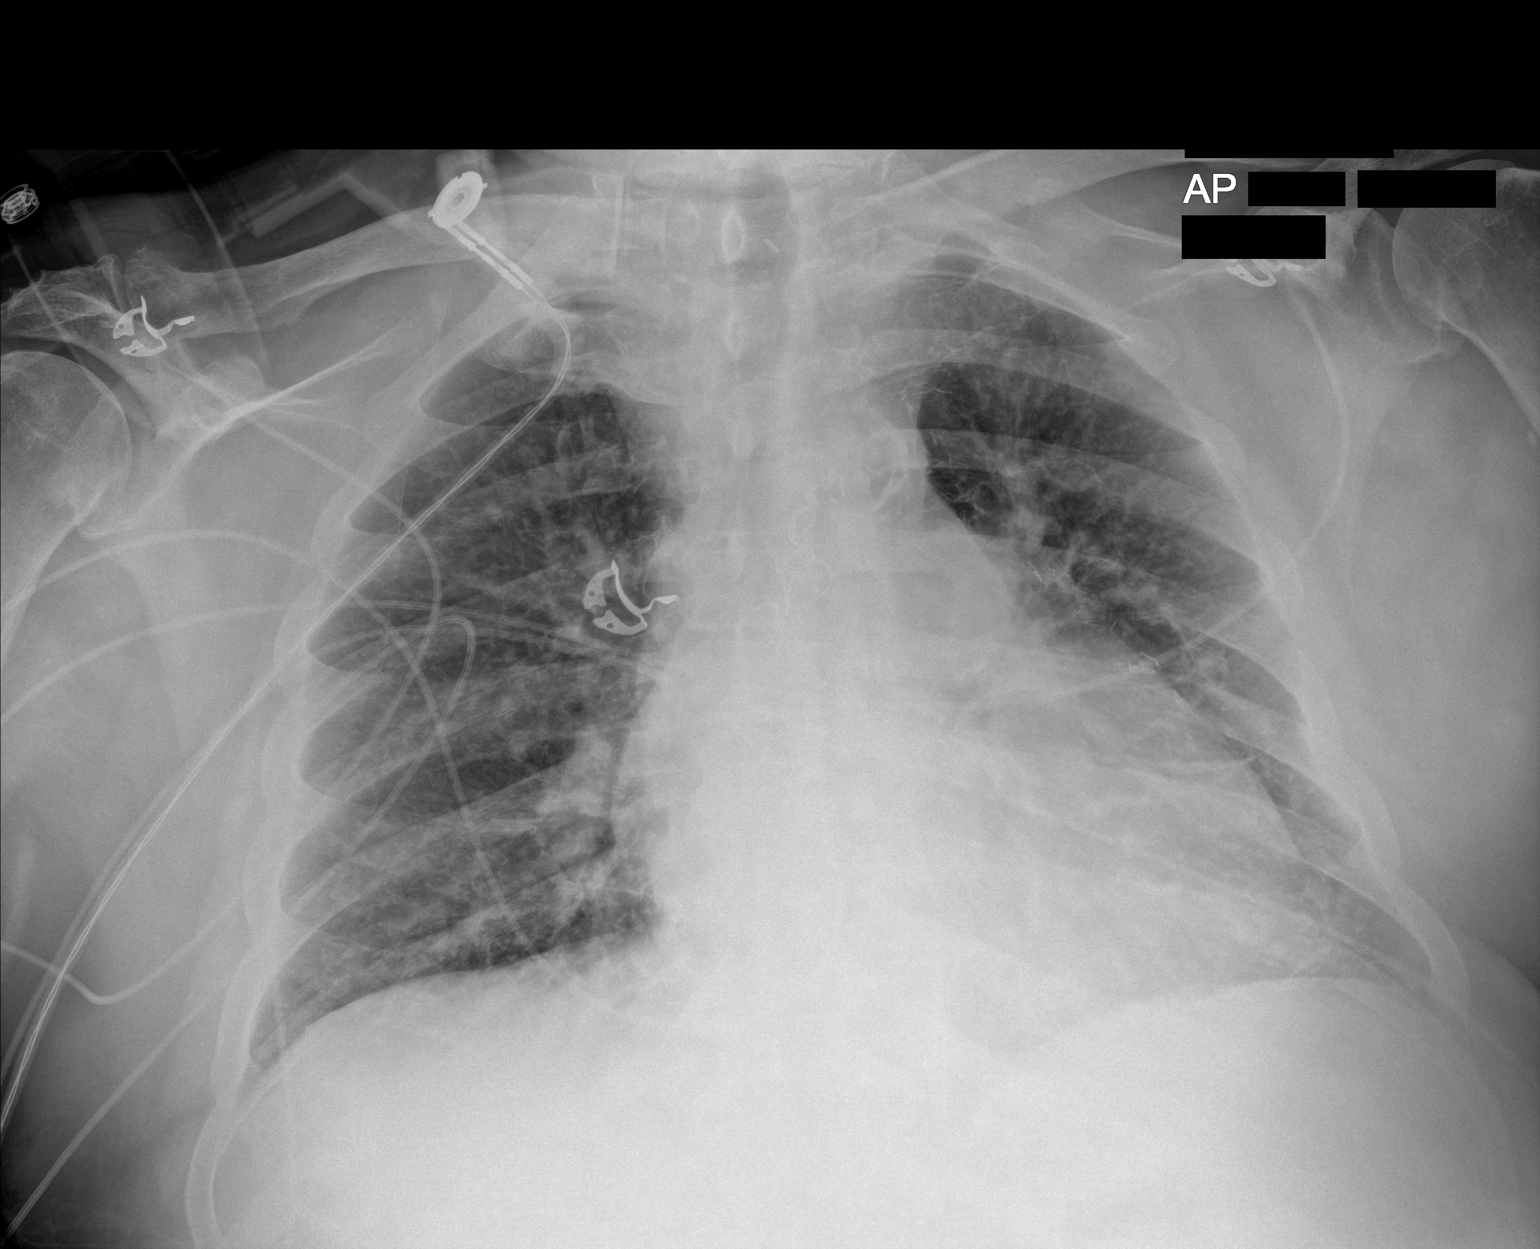

[1 of 1 positions shown; findings below may reference images not displayed]

FINDINGS: Cardiac shadow is stable. Aortic calcifications are again noted and
stable. Lungs are well aerated bilaterally. Mild vascular congestion
is again seen and stable. No new focal infiltrate is seen. No bony
abnormality is noted.
IMPRESSION: Stable vascular congestion.  No new focal abnormality is noted.

## 2020-12-28 ENCOUNTER — Other Ambulatory Visit: Payer: Self-pay | Admitting: Internal Medicine

## 2021-01-02 ENCOUNTER — Other Ambulatory Visit: Payer: Self-pay | Admitting: Internal Medicine

## 2021-01-15 DIAGNOSIS — L408 Other psoriasis: Secondary | ICD-10-CM | POA: Diagnosis not present

## 2021-01-15 DIAGNOSIS — L821 Other seborrheic keratosis: Secondary | ICD-10-CM | POA: Diagnosis not present

## 2021-02-06 ENCOUNTER — Other Ambulatory Visit: Payer: Self-pay | Admitting: Internal Medicine

## 2021-02-11 LAB — HM DIABETES FOOT EXAM: HM Diabetic Foot Exam: NORMAL

## 2021-02-26 ENCOUNTER — Other Ambulatory Visit: Payer: Self-pay

## 2021-02-26 ENCOUNTER — Ambulatory Visit: Payer: Medicare Other

## 2021-02-28 ENCOUNTER — Other Ambulatory Visit: Payer: Self-pay | Admitting: Internal Medicine

## 2021-03-05 ENCOUNTER — Encounter: Payer: Medicare Other | Admitting: Internal Medicine

## 2021-03-05 ENCOUNTER — Other Ambulatory Visit (INDEPENDENT_AMBULATORY_CARE_PROVIDER_SITE_OTHER): Payer: Medicare Other

## 2021-03-05 ENCOUNTER — Other Ambulatory Visit: Payer: Self-pay

## 2021-03-05 DIAGNOSIS — E1159 Type 2 diabetes mellitus with other circulatory complications: Secondary | ICD-10-CM

## 2021-03-05 DIAGNOSIS — E1165 Type 2 diabetes mellitus with hyperglycemia: Secondary | ICD-10-CM | POA: Diagnosis not present

## 2021-03-05 DIAGNOSIS — N521 Erectile dysfunction due to diseases classified elsewhere: Secondary | ICD-10-CM

## 2021-03-05 DIAGNOSIS — IMO0002 Reserved for concepts with insufficient information to code with codable children: Secondary | ICD-10-CM

## 2021-03-05 LAB — POCT GLYCOSYLATED HEMOGLOBIN (HGB A1C): HbA1c POC (<> result, manual entry): 6.6 % (ref 4.0–5.6)

## 2021-03-05 NOTE — Addendum Note (Signed)
Addended by: Alois Cliche on: 03/05/2021 09:51 AM   Modules accepted: Orders

## 2021-03-05 NOTE — Progress Notes (Signed)
C a1c

## 2021-03-06 DIAGNOSIS — I10 Essential (primary) hypertension: Secondary | ICD-10-CM | POA: Diagnosis not present

## 2021-03-06 DIAGNOSIS — E1122 Type 2 diabetes mellitus with diabetic chronic kidney disease: Secondary | ICD-10-CM | POA: Diagnosis not present

## 2021-03-06 DIAGNOSIS — N1832 Chronic kidney disease, stage 3b: Secondary | ICD-10-CM | POA: Diagnosis not present

## 2021-03-06 DIAGNOSIS — N2581 Secondary hyperparathyroidism of renal origin: Secondary | ICD-10-CM | POA: Diagnosis not present

## 2021-03-09 ENCOUNTER — Other Ambulatory Visit: Payer: Self-pay | Admitting: Internal Medicine

## 2021-03-10 ENCOUNTER — Other Ambulatory Visit: Payer: Self-pay | Admitting: Internal Medicine

## 2021-03-19 ENCOUNTER — Other Ambulatory Visit: Payer: Self-pay

## 2021-03-19 ENCOUNTER — Telehealth: Payer: Self-pay | Admitting: Gastroenterology

## 2021-03-19 DIAGNOSIS — Z86010 Personal history of colonic polyps: Secondary | ICD-10-CM

## 2021-03-19 MED ORDER — PEG 3350-KCL-NA BICARB-NACL 420 G PO SOLR: 4000.0000 mL | Freq: Once | ORAL | 0 refills | Status: AC

## 2021-03-19 NOTE — Telephone Encounter (Signed)
Patient procedure has been scheduled for 10/18 w/ Wohl. Instructions sent via my chart and mailed out. Prep sent to pharmacy.

## 2021-03-19 NOTE — Telephone Encounter (Signed)
LVM that he is ready to schedule his colonoscopy.

## 2021-03-19 NOTE — Progress Notes (Signed)
Patient called to schedule his recall colonoscopy. Instructions will be sent via my chart and mailed. Prep sent to pharmacy.

## 2021-03-24 ENCOUNTER — Other Ambulatory Visit: Payer: Self-pay | Admitting: Internal Medicine

## 2021-03-24 ENCOUNTER — Other Ambulatory Visit: Payer: Self-pay | Admitting: Nurse Practitioner

## 2021-03-25 ENCOUNTER — Encounter: Payer: Self-pay | Admitting: Cardiovascular Disease

## 2021-03-25 ENCOUNTER — Other Ambulatory Visit: Payer: Self-pay

## 2021-03-25 ENCOUNTER — Ambulatory Visit (INDEPENDENT_AMBULATORY_CARE_PROVIDER_SITE_OTHER): Payer: Medicare Other | Admitting: Cardiovascular Disease

## 2021-03-25 VITALS — BP 110/60 | HR 58 | Ht 70.0 in | Wt 241.2 lb

## 2021-03-25 DIAGNOSIS — E1142 Type 2 diabetes mellitus with diabetic polyneuropathy: Secondary | ICD-10-CM

## 2021-03-25 DIAGNOSIS — E1165 Type 2 diabetes mellitus with hyperglycemia: Secondary | ICD-10-CM | POA: Diagnosis not present

## 2021-03-25 DIAGNOSIS — I5033 Acute on chronic diastolic (congestive) heart failure: Secondary | ICD-10-CM

## 2021-03-25 DIAGNOSIS — N521 Erectile dysfunction due to diseases classified elsewhere: Secondary | ICD-10-CM

## 2021-03-25 DIAGNOSIS — I10 Essential (primary) hypertension: Secondary | ICD-10-CM | POA: Diagnosis not present

## 2021-03-25 DIAGNOSIS — J449 Chronic obstructive pulmonary disease, unspecified: Secondary | ICD-10-CM

## 2021-03-25 DIAGNOSIS — E1159 Type 2 diabetes mellitus with other circulatory complications: Secondary | ICD-10-CM

## 2021-03-25 DIAGNOSIS — I25118 Atherosclerotic heart disease of native coronary artery with other forms of angina pectoris: Secondary | ICD-10-CM

## 2021-03-25 DIAGNOSIS — IMO0002 Reserved for concepts with insufficient information to code with codable children: Secondary | ICD-10-CM

## 2021-03-25 DIAGNOSIS — E785 Hyperlipidemia, unspecified: Secondary | ICD-10-CM

## 2021-03-25 DIAGNOSIS — I6521 Occlusion and stenosis of right carotid artery: Secondary | ICD-10-CM | POA: Diagnosis not present

## 2021-03-25 DIAGNOSIS — E782 Mixed hyperlipidemia: Secondary | ICD-10-CM | POA: Diagnosis not present

## 2021-03-25 MED ORDER — LOSARTAN POTASSIUM 50 MG PO TABS: 50.0000 mg | ORAL_TABLET | Freq: Every day | ORAL | 3 refills | Status: DC

## 2021-03-25 MED ORDER — CLOPIDOGREL BISULFATE 75 MG PO TABS: 75.0000 mg | ORAL_TABLET | Freq: Every day | ORAL | 3 refills | Status: DC

## 2021-03-25 MED ORDER — METOPROLOL TARTRATE 25 MG PO TABS: 25.0000 mg | ORAL_TABLET | Freq: Two times a day (BID) | ORAL | 3 refills | Status: DC

## 2021-03-25 MED ORDER — FUROSEMIDE 40 MG PO TABS: 40.0000 mg | ORAL_TABLET | Freq: Two times a day (BID) | ORAL | 3 refills | Status: DC

## 2021-03-25 MED ORDER — SPIRONOLACTONE 25 MG PO TABS: 25.0000 mg | ORAL_TABLET | Freq: Every day | ORAL | 3 refills | Status: DC

## 2021-03-25 MED ORDER — ATORVASTATIN CALCIUM 80 MG PO TABS: 80.0000 mg | ORAL_TABLET | Freq: Every day | ORAL | 3 refills | Status: DC

## 2021-03-25 NOTE — Patient Instructions (Addendum)
Medication Instructions:  No changes  If you need a refill on your cardiac medications before your next appointment, please call your pharmacy.   Lab work: No new labs needed  Testing/Procedures: No new testing needed  Follow-Up: At [facility], you and your health needs are our priority.  As part of our continuing mission to provide you with exceptional heart care, we have created designated Provider Care Teams.  These Care Teams include your primary Cardiologist (physician) and Advanced Practice Providers (APPs -  Physician Assistants and Nurse Practitioners) who all work together to provide you with the care you need, when you need it.  You will need a follow up appointment in 12 months  Providers on your designated Care Team:   Christopher Berge, NP Ryan Dunn, PA-C Jacquelyn Visser, PA-C Cadence Furth, PA-C  COVID-19 Vaccine Information can be found at: [email or web address] For questions related to vaccine distribution or appointments, please email [email or web address] or call [phone].    

## 2021-03-25 NOTE — Progress Notes (Signed)
Cardiology Office Note  Date:  03/25/2021   ID:  SOFIA JAQUITH, DOB October 10, 1950, MRN [id number]  PCP:  [name], MD   Chief Complaint  Patient presents with   6- month follow up     "Doing well." Medications reviewed by the patient verbally.     HPI:  Mr. Boeckman is 70 year old gentleman with a history of  coronary artery disease,  occluded mid to distal RCA  by cardiac catheterization in 2001,  residual 60% proximal to mid LAD disease at that time, 30% diagonal disease, 10% left main disease, 20% proximal left circumflex disease.  smoking and continues to smoke  hyperlipidemia,  obesity and diabetes   bypass surgery March 2021,  known carotid arterial disease prior carotid endarterectomy March 2021, postoperative atrial fibrillation He presents today for follow-up of his coronary artery disease, CHF, hx of CABG  Last seen by myself in clinic February 2022 Still smoking 3/4 ppd  Owns 6 acres,  active in the spring and summer  Denies any chest pain concerning for angina Denies claudication symptoms  Wife does meds Thinks he is taking Lasix twice daily on a regular basis  A1c 6.6 CR 1.96 Total chol 165, up from 91  long hospital course April 2021 Multiorgan failure as detailed below  EKG personally reviewed by myself on todays visit nsr rate 75 bpm old anteriopr MI  Past medical history reviewed  May 2021, CHF exacerbation, sent to the hospital multiorgan failure COPD, anemia, acute on chronic kidney failure, difficulty with diuresis in the setting of CHF,  Management limited by his anxiety and desire to go home Chronic hypoxia saturations in the 80s on 10 L oxygen throughout his hospital course -Also treated for bronchitis Poorly controlled diabetes He was in denial as to his numerous medical issues and continued desire to go home despite saturations in the low 80s on 10 L " I can do it at home" Wife was not available at the bedside for  discussions on the days that I was rounding -He blamed the office (presumably cardiology office) for not fixing his symptoms when he called week earlier but there was no documentation of him calling with CHF exacerbation  His last stress test was 2009 when he had a small fixed inferior perfusion defect, ejection fraction 51%  PMH:   has a past medical history of (HFpEF) heart failure with preserved ejection fraction ([place]), ACE-inhibitor cough, Anxiety, Bronchitis (06/27/2016), Carotid arterial disease ([place]), CKD (chronic kidney disease), stage III ([place]), COPD (chronic obstructive pulmonary disease) ([place]), Coronary artery disease, Diabetes mellitus type II, Hyperlipidemia, Hypertension, MI (myocardial infarction) ([place]) (age 73), OA (osteoarthritis), PAF (paroxysmal atrial fibrillation) ([place]), Pneumonia, PSVT (paroxysmal supraventricular tachycardia) ([place]), and Tobacco abuse.  PSH:    Past Surgical History:  Procedure Laterality Date   CARDIAC CATHETERIZATION  05/06/2000   @ [facility]   COLONOSCOPY WITH PROPOFOL N/A 04/20/2018   Procedure: COLONOSCOPY WITH PROPOFOL;  Surgeon: [name], MD;  Location: [facility] ENDOSCOPY;  Service: Endoscopy;  Laterality: N/A;   CORONARY ARTERY BYPASS GRAFT N/A 10/07/2019   Procedure: CORONARY ARTERY BYPASS GRAFTING (CABG) using LIMA to LAD; Endoscopic harvesting of left greater saphenous vein: SVG to RAMUS; SVG to OM1; SVG to PDA.;  Surgeon: Gaye Pollack, MD;  Location: [facility] OR;  Service: Open Heart Surgery;  Laterality: N/A;   ENDARTERECTOMY Right 10/07/2019   Procedure: ENDARTERECTOMY CAROTID;  Surgeon: Rosetta Posner, MD;  Location: [place];  Service: Vascular;  Laterality: Right;   ENDOVEIN  HARVEST OF GREATER SAPHENOUS VEIN Left 10/07/2019   Procedure: [place] Of Greater Saphenous Vein;  Surgeon: Gaye Pollack, MD;  Location: [facility] OR;  Service: Open Heart Surgery;  Laterality: Left;   ESOPHAGOGASTRODUODENOSCOPY ENDOSCOPY     KNEE ARTHROSCOPY  07/25/04   left    [place]   right, open surgery- repair   RIGHT/LEFT HEART CATH AND CORONARY ANGIOGRAPHY Bilateral 09/08/2019   Procedure: RIGHT/LEFT HEART CATH AND CORONARY ANGIOGRAPHY;  Surgeon: Minna Merritts, MD;  Location: [place] CV LAB;  Service: Cardiovascular;  Laterality: Bilateral;   TEE WITHOUT CARDIOVERSION N/A 10/07/2019   Procedure: TRANSESOPHAGEAL ECHOCARDIOGRAM (TEE);  Surgeon: Gaye Pollack, MD;  Location: [place];  Service: Open Heart Surgery;  Laterality: N/A;   TOTAL KNEE ARTHROPLASTY Left 07/14/2016   Procedure: LEFT TOTAL KNEE ARTHROPLASTY;  Surgeon: Gaynelle Arabian, MD;  Location: [facility] ORS;  Service: Orthopedics;  Laterality: Left;   TOTAL KNEE ARTHROPLASTY Right 07/13/2017   Procedure: RIGHT TOTAL KNEE ARTHROPLASTY;  Surgeon: Gaynelle Arabian, MD;  Location: [facility] ORS;  Service: Orthopedics;  Laterality: Right;    Current Outpatient Medications  Medication Sig Dispense Refill   ALPRAZolam (XANAX) 0.25 MG tablet Take 1 tablet (0.25 mg total) by mouth 2 (two) times daily as needed for anxiety. 20 tablet 0   atorvastatin (LIPITOR) 80 MG tablet Take 1 tablet (80 mg total) by mouth daily. 90 tablet 3   clopidogrel (PLAVIX) 75 MG tablet TAKE 1 TABLET BY MOUTH EVERY DAY 90 tablet 3   fenofibrate 160 MG tablet Take 1 tablet (160 mg total) by mouth daily. 90 tablet 1   furosemide (LASIX) 40 MG tablet TAKE 1 TABLET BY MOUTH TWICE A DAY 180 tablet 1   glimepiride (AMARYL) 2 MG tablet TAKE 1/2 TABLET BY MOUTH IN THE MORNING 45 tablet 7   glucose blood (TRUE METRIX BLOOD GLUCOSE TEST) test strip 1 each by Other route daily. Use as instructed 100 each 12   losartan (COZAAR) 50 MG tablet TAKE 1 TABLET BY MOUTH EVERY DAY 90 tablet 3   metFORMIN (GLUCOPHAGE) 1000 MG tablet TAKE 1 TABLET BY MOUTH TWICE A DAY 180 tablet 6   metoprolol tartrate (LOPRESSOR) 25 MG tablet TAKE 1 TABLET BY MOUTH TWICE A DAY 180 tablet 3   nitroGLYCERIN (NITROSTAT) 0.4 MG SL tablet Place 0.4 mg under the tongue every  5 (five) minutes as needed for chest pain.     pantoprazole (PROTONIX) 40 MG tablet Take 1 tablet (40 mg total) by mouth daily before breakfast. 90 tablet 3   sildenafil (REVATIO) 20 MG tablet TAKE ONE TABLET BY MOUTH DAILY AS NEEDED 30 tablet 6   sildenafil (VIAGRA) 100 MG tablet Take 1 tablet (100 mg total) by mouth daily as needed for erectile dysfunction. 20 tablet 0   spironolactone (ALDACTONE) 25 MG tablet TAKE 1 TABLET (25 MG TOTAL) BY MOUTH DAILY. 30 tablet 0   traZODone (DESYREL) 50 MG tablet TAKE 1 TABLET BY MOUTH EVERY DAY 90 tablet 1   No current facility-administered medications for this visit.     Allergies:   Ramipril and Pseudoephedrine-guaifenesin er   Social History:  The patient  reports that he quit smoking about 17 months ago. His smoking use included cigarettes. He has a 40.00 pack-year smoking history. He has never used smokeless tobacco. He reports current alcohol use. He reports that he does not use drugs.   Family History:   family history includes Alcohol abuse in his father; Cancer  in his brother; Colon cancer in his brother; Diabetes in his father and sister; Heart disease in his father and sister; Hypertension in his father.    Review of Systems: Review of Systems  Constitutional: Negative.   HENT: Negative.    Respiratory: Negative.    Cardiovascular: Negative.   Gastrointestinal: Negative.   Musculoskeletal: Negative.   Neurological: Negative.   Psychiatric/Behavioral: Negative.    All other systems reviewed and are negative.  PHYSICAL EXAM: VS:  BP 110/60 (BP Location: Left Arm, Patient Position: Sitting, Cuff Size: Normal)   Pulse (!) 58   Ht 5\' 10"  (1.778 m)   Wt 241 lb 4 oz (109.4 kg)   SpO2 96%   BMI 34.62 kg/m  , BMI Body mass index is 34.62 kg/m. GEN: Well nourished, well developed, in no acute distress  HEENT: normal  Neck: no JVD, carotid bruits, or masses Cardiac: RRR; no murmurs, rubs, or gallops, Trace edema left lower extremity  below the knees, none on the right Respiratory: Coarse breath sounds GI: soft, nontender, nondistended, + BS MS: no deformity or atrophy  Skin: warm and dry, no rash Neuro:  Strength and sensation are intact Psych: euthymic mood, full affect  Recent Labs: 06/12/2020: ALT 16; BUN 27; Creat 1.96; Hemoglobin 13.2; Platelets 307; Potassium 5.2; Sodium 142; TSH 1.66    Lipid Panel Lab Results  Component Value Date   CHOL 165 06/12/2020   HDL 34 (L) 06/12/2020   LDLCALC 100 (H) 06/12/2020   TRIG 185 (H) 06/12/2020      Wt Readings from Last 3 Encounters:  03/25/21 241 lb 4 oz (109.4 kg)  11/05/20 249 lb 12.8 oz (113.3 kg)  09/24/20 249 lb (112.9 kg)     ASSESSMENT AND PLAN:  Coronary artery disease of native artery of native heart with stable angina pectoris ([place]) bypass surgery 2021 Has recovered well, active on his acreage Stressed importance of smoking cessation, staying on his Lipitor  type 2 diabetes mellitus with circulatory disorder  A1c well controlled  Chronic diastolic CHF 5852 hospitalization, diuresed 30 pounds Recommend he stay on Lasix 40 twice daily Appears euvolemic on today's visit  Mixed hyperlipidemia Numbers above goal, stressed importance of taking Lipitor 80  Essential hypertension Blood pressure is well controlled on today's visit. No changes made to the medications.   DISORDER, TOBACCO USE We have encouraged him to continue to work on weaning his cigarettes and smoking cessation. He will continue to work on this and does not want any assistance with chantix.     Centrilobular emphysema (HCC)  COPD,  Smoking cessation recommended  Morbid obesity ([place]) We have encouraged continued exercise, careful diet management in an effort to lose weight.   Total encounter time more than 25 minutes  Greater than 50% was spent in counseling and coordination of care with the patient   No orders of the defined types were placed in this  encounter.    Signed, Esmond Plants, M.D., Ph.D. 03/25/2021  [facility] [place], [place] [phone]

## 2021-03-26 DIAGNOSIS — D2261 Melanocytic nevi of right upper limb, including shoulder: Secondary | ICD-10-CM | POA: Diagnosis not present

## 2021-03-26 DIAGNOSIS — D2272 Melanocytic nevi of left lower limb, including hip: Secondary | ICD-10-CM | POA: Diagnosis not present

## 2021-03-26 DIAGNOSIS — L570 Actinic keratosis: Secondary | ICD-10-CM | POA: Diagnosis not present

## 2021-03-26 DIAGNOSIS — D2262 Melanocytic nevi of left upper limb, including shoulder: Secondary | ICD-10-CM | POA: Diagnosis not present

## 2021-03-26 DIAGNOSIS — X32XXXA Exposure to sunlight, initial encounter: Secondary | ICD-10-CM | POA: Diagnosis not present

## 2021-03-26 DIAGNOSIS — D2271 Melanocytic nevi of right lower limb, including hip: Secondary | ICD-10-CM | POA: Diagnosis not present

## 2021-03-26 DIAGNOSIS — L821 Other seborrheic keratosis: Secondary | ICD-10-CM | POA: Diagnosis not present

## 2021-03-26 DIAGNOSIS — L820 Inflamed seborrheic keratosis: Secondary | ICD-10-CM | POA: Diagnosis not present

## 2021-03-26 DIAGNOSIS — D225 Melanocytic nevi of trunk: Secondary | ICD-10-CM | POA: Diagnosis not present

## 2021-03-26 DIAGNOSIS — R208 Other disturbances of skin sensation: Secondary | ICD-10-CM | POA: Diagnosis not present

## 2021-03-29 ENCOUNTER — Other Ambulatory Visit: Payer: Self-pay | Admitting: Internal Medicine

## 2021-04-02 DIAGNOSIS — N1832 Chronic kidney disease, stage 3b: Secondary | ICD-10-CM | POA: Diagnosis not present

## 2021-04-02 NOTE — Addendum Note (Signed)
Addended by: Anselm Pancoast on: 04/02/2021 08:56 AM   Modules accepted: Orders

## 2021-04-17 DIAGNOSIS — E1122 Type 2 diabetes mellitus with diabetic chronic kidney disease: Secondary | ICD-10-CM | POA: Diagnosis not present

## 2021-04-17 DIAGNOSIS — N1832 Chronic kidney disease, stage 3b: Secondary | ICD-10-CM | POA: Diagnosis not present

## 2021-04-22 DIAGNOSIS — Z23 Encounter for immunization: Secondary | ICD-10-CM | POA: Diagnosis not present

## 2021-05-01 DIAGNOSIS — Z23 Encounter for immunization: Secondary | ICD-10-CM | POA: Diagnosis not present

## 2021-05-13 ENCOUNTER — Encounter: Payer: Self-pay | Admitting: Gastroenterology

## 2021-05-14 ENCOUNTER — Encounter: Payer: Self-pay | Admitting: Anesthesiology

## 2021-05-14 ENCOUNTER — Encounter
Admission: RE | Disposition: A | Discharge status: Home / Self Care | Payer: Self-pay | Source: Home / Self Care | Attending: Gastroenterology

## 2021-05-14 ENCOUNTER — Ambulatory Visit
Admission: RE | Admit: 2021-05-14 | Discharge: 2021-05-14 | Disposition: A | Discharge status: Home / Self Care | Payer: Medicare Other | Attending: Gastroenterology | Admitting: Gastroenterology

## 2021-05-14 SURGERY — COLONOSCOPY WITH PROPOFOL
Anesthesia: General

## 2021-05-14 NOTE — Anesthesia Preprocedure Evaluation (Deleted)
Anesthesia Evaluation    Airway        Dental   Pulmonary COPD, Current Smoker and Patient abstained from smoking.,           Cardiovascular hypertension, + CAD (s/p MI and CABG 09/2019 on Plavix), + Peripheral Vascular Disease (carotid artery disease) and +CHF    ECG 03/25/21: Sinus bradycardia with 1st degree AVB; nonspecific T wave abnormality   Neuro/Psych PSYCHIATRIC DISORDERS Anxiety    GI/Hepatic PUD,   Endo/Other  diabetes, Type 2Class 3 obesity  Renal/GU Renal disease (stage III CKD)     Musculoskeletal  (+) Arthritis ,   Abdominal   Peds  Hematology   Anesthesia Other Findings Reviewed cardiology note 03/25/21  Reproductive/Obstetrics                             Anesthesia Physical Anesthesia Plan  ASA: 3  Anesthesia Plan: General   Post-op Pain Management:    Induction: Intravenous  PONV Risk Score and Plan: 2 and Propofol infusion, TIVA and Treatment may vary due to age or medical condition  Airway Management Planned: Natural Airway  Additional Equipment:   Intra-op Plan:   Post-operative Plan:   Informed Consent: I have reviewed the patients History and Physical, chart, labs and discussed the procedure including the risks, benefits and alternatives for the proposed anesthesia with the patient or authorized representative who has indicated his/her understanding and acceptance.       Plan Discussed with: CRNA  Anesthesia Plan Comments:         Anesthesia Quick Evaluation

## 2021-05-20 ENCOUNTER — Other Ambulatory Visit: Payer: Self-pay

## 2021-05-20 MED ORDER — METHYLPREDNISOLONE 4 MG PO TBPK: ORAL_TABLET | ORAL | 0 refills | Status: DC

## 2021-05-28 ENCOUNTER — Ambulatory Visit (INDEPENDENT_AMBULATORY_CARE_PROVIDER_SITE_OTHER): Payer: Medicare Other | Admitting: Internal Medicine

## 2021-05-28 ENCOUNTER — Other Ambulatory Visit: Payer: Self-pay

## 2021-05-28 ENCOUNTER — Encounter: Payer: Self-pay | Admitting: Internal Medicine

## 2021-05-28 VITALS — BP 145/58 | HR 83 | Ht 70.0 in | Wt 250.7 lb

## 2021-05-28 DIAGNOSIS — R350 Frequency of micturition: Secondary | ICD-10-CM | POA: Diagnosis not present

## 2021-05-28 DIAGNOSIS — I10 Essential (primary) hypertension: Secondary | ICD-10-CM

## 2021-05-28 DIAGNOSIS — Z951 Presence of aortocoronary bypass graft: Secondary | ICD-10-CM

## 2021-05-28 DIAGNOSIS — I25118 Atherosclerotic heart disease of native coronary artery with other forms of angina pectoris: Secondary | ICD-10-CM | POA: Diagnosis not present

## 2021-05-28 DIAGNOSIS — F17200 Nicotine dependence, unspecified, uncomplicated: Secondary | ICD-10-CM

## 2021-05-28 DIAGNOSIS — I6521 Occlusion and stenosis of right carotid artery: Secondary | ICD-10-CM | POA: Diagnosis not present

## 2021-05-28 DIAGNOSIS — F419 Anxiety disorder, unspecified: Secondary | ICD-10-CM

## 2021-05-28 DIAGNOSIS — G4733 Obstructive sleep apnea (adult) (pediatric): Secondary | ICD-10-CM | POA: Diagnosis not present

## 2021-05-28 LAB — POCT URINALYSIS DIPSTICK
Bilirubin, UA: NEGATIVE
Blood, UA: NEGATIVE
Glucose, UA: NEGATIVE
Nitrite, UA: NEGATIVE
Protein, UA: NEGATIVE
Spec Grav, UA: 1.01 (ref 1.010–1.025)
Urobilinogen, UA: 1 E.U./dL
pH, UA: 8 (ref 5.0–8.0)

## 2021-05-28 MED ORDER — AZITHROMYCIN 250 MG PO TABS: ORAL_TABLET | ORAL | 0 refills | Status: AC

## 2021-05-28 MED ORDER — METHYLPREDNISOLONE 4 MG PO TBPK: ORAL_TABLET | ORAL | 0 refills | Status: DC

## 2021-05-28 MED ORDER — ALPRAZOLAM 0.25 MG PO TABS: 0.2500 mg | ORAL_TABLET | Freq: Two times a day (BID) | ORAL | 0 refills | Status: DC | PRN

## 2021-05-28 NOTE — Assessment & Plan Note (Signed)
   Keeping a stress/anxiety diary. This can help you learn what triggers your reaction and then learn ways to manage your response.  Thinking about how you react to certain situations. You may not be able to control everything, but you can control your response.  Making time for activities that help you relax and not feeling guilty about spending your time in this way.  Visual imagery and yoga can help you stay calm and relax. 

## 2021-05-28 NOTE — Assessment & Plan Note (Signed)
Stable no angina 

## 2021-05-28 NOTE — Assessment & Plan Note (Signed)

## 2021-05-28 NOTE — Assessment & Plan Note (Signed)
Patient is not using the CPAP machine is sleeping on his side he was advised to quit smoking and lose weight, try to do another sleep study.

## 2021-05-28 NOTE — Assessment & Plan Note (Signed)
-   I instructed the patient to stop smoking and provided them with smoking cessation materials.  - I informed the patient that smoking puts them at increased risk for cancer, COPD, hypertension, and more.  - Informed the patient to seek help if they begin to have trouble breathing, develop chest pain, start to cough up blood, feel faint, or pass out.  

## 2021-05-28 NOTE — Assessment & Plan Note (Signed)

## 2021-05-28 NOTE — Assessment & Plan Note (Signed)
There is no history of angina no swelling of the legs no signs or symptoms of congestive heart failure

## 2021-05-28 NOTE — Progress Notes (Signed)
Established Patient Office Visit  Subjective:  Patient ID: Joshua Roy, male    DOB: 01-Nov-1950  Age: 70 y.o. MRN: [id number]  CC:  Chief Complaint  Patient presents with   Anxiety    Patient needs refill of xanax    Insomnia    Trazodone not helping with sleep     Anxiety Symptoms include insomnia and shortness of breath.    Insomnia   Joshua Roy presents for check up, he had 2 weeks ago, he finished a course of prednisone and Z-Pak, history of rattling in the right side of the chest so he was given another course of Z-Pak and prednisone he was advised to quit smoking.  He was also advised to lose weight.  He denies any chest pain suggestive of angina he was not found to be wheezing on examination of the chest except for the rattling on the right side. Past Medical History:  Diagnosis Date   (HFpEF) heart failure with preserved ejection fraction ([place])    a. 07/2019 Echo: EF 60-65%. Mod LVH. Gr1 DD. No rwma. Nl RV size/fxn. Mildly dil LA; b. 11/2019 Echo: EF 55-60%, no rwma, mild LVH. Nl RV size/fxn. Mod dil LA.   ACE-inhibitor cough    Anxiety    Bronchitis 06/27/2016   Carotid arterial disease ([place])    a. 09/2019 Carotid U/S: RICA 51-76%, LICA 1-60%; b. 01/3709 R CEA.   CKD (chronic kidney disease), stage III (HCC)    COPD (chronic obstructive pulmonary disease) (HCC)    cath, mild inf. hypokinesis EF 49%, 100% ROA   Coronary artery disease    a. 2001 Cath: LM 10, LAD 60p/m, D1 30, LCX 20p, RCA 126m/d-->Med Rx; b. 05/2017 Ex Mv: Ex time 5:46, antlat twi @ rest, fixed infarct w/ mild peri-infarct ischemia. EF 38%; c. 08/2019 Cath: LM 40ost, 70d, LAD 33m, LCX 66m, RCA 80-90p, 16m; d. 09/2019 CABG x4: LIMA->LAD, VG->RI, VG->OM, VG->RPDA.   Diabetes mellitus type II    Hyperlipidemia    Hypertension    MI (myocardial infarction) [facility]) age 11   OA (osteoarthritis)    knee OA, injected 2012 by ortho   PAF (paroxysmal atrial fibrillation) ([place])    a. 09/2019  post-op CABG-->[facility].   Pneumonia    PSVT (paroxysmal supraventricular tachycardia) ([place])    a. 08/2019 Zio: 189 brief runs of SVT (fastest 174 x 6 beats, longest 15 beats @ 107).   Tobacco abuse     Past Surgical History:  Procedure Laterality Date   CARDIAC CATHETERIZATION  05/06/2000   @ [place]   COLONOSCOPY WITH PROPOFOL N/A 04/20/2018   Procedure: COLONOSCOPY WITH PROPOFOL;  Surgeon: [name], MD;  Location: [facility] ENDOSCOPY;  Service: Endoscopy;  Laterality: N/A;   CORONARY ARTERY BYPASS GRAFT N/A 10/07/2019   Procedure: CORONARY ARTERY BYPASS GRAFTING (CABG) using LIMA to LAD; Endoscopic harvesting of left greater saphenous vein: SVG to RAMUS; SVG to OM1; SVG to PDA.;  Surgeon: Gaye Pollack, MD;  Location: [facility] OR;  Service: Open Heart Surgery;  Laterality: N/A;   ENDARTERECTOMY Right 10/07/2019   Procedure: ENDARTERECTOMY CAROTID;  Surgeon: Rosetta Posner, MD;  Location: [facility] OR;  Service: Vascular;  Laterality: Right;   ENDOVEIN HARVEST OF GREATER SAPHENOUS VEIN Left 10/07/2019   Procedure: [place] Of Greater Saphenous Vein;  Surgeon: Gaye Pollack, MD;  Location[facility] OR;  Service: Open Heart Surgery;  Laterality: Left;   ESOPHAGOGASTRODUODENOSCOPY ENDOSCOPY     KNEE ARTHROSCOPY  07/25/04  left   [place]   right, open surgery- repair   RIGHT/LEFT HEART CATH AND CORONARY ANGIOGRAPHY Bilateral 09/08/2019   Procedure: RIGHT/LEFT HEART CATH AND CORONARY ANGIOGRAPHY;  Surgeon: Minna Merritts, MD;  Location: [place];  Service: Cardiovascular;  Laterality: Bilateral;   TEE WITHOUT CARDIOVERSION N/A 10/07/2019   Procedure: TRANSESOPHAGEAL ECHOCARDIOGRAM (TEE);  Surgeon: Gaye Pollack, MD;  Location: [place];  Service: Open Heart Surgery;  Laterality: N/A;   TOTAL KNEE ARTHROPLASTY Left 07/14/2016   Procedure: LEFT TOTAL KNEE ARTHROPLASTY;  Surgeon: Gaynelle Arabian, MD;  Location: [facility] ORS;  Service: Orthopedics;  Laterality: Left;   TOTAL KNEE ARTHROPLASTY Right  07/13/2017   Procedure: RIGHT TOTAL KNEE ARTHROPLASTY;  Surgeon: Gaynelle Arabian, MD;  Location: [facility] ORS;  Service: Orthopedics;  Laterality: Right;    Family History  Problem Relation Age of Onset   Heart disease Father        CAD, MI x 3   Hypertension Father    Alcohol abuse Father    Diabetes Father    Diabetes Sister    Cancer Brother        lymphoma, in remission   Colon cancer Brother    Heart disease Sister        CABG x 3   Prostate cancer Neg Hx     Social History   Socioeconomic History   Marital status: Married    Spouse name: Janie   Number of children: 3   Years of education: Not on file   Highest education level: Not on file  Occupational History   Occupation: Engineer, manufacturing, Estate agent  Tobacco Use   Smoking status: Former    Packs/day: 1.00    Years: 40.00    Pack years: 40.00    Types: Cigarettes    Quit date: 10/07/2019    Years since quitting: 1.6   Smokeless tobacco: Never   Tobacco comments:    per patient wears a patch and has cut down on cigarettes/day (10/05/2019)  Vaping Use   Vaping Use: Never used  Substance and Sexual Activity   Alcohol use: Yes    Alcohol/week: 0.0 standard drinks    Comment: 5-6 cocktails, 1 times a week   Drug use: No   Sexual activity: Not on file  Other Topics Concern   Not on file  Social History Narrative   From [place].  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   Social Determinants of Health   Financial Resource Strain: Not on file  Food Insecurity: Not on file  Transportation Needs: Not on file  Physical Activity: Not on file  Stress: Not on file  Social Connections: Not on file  Intimate Partner Violence: Not on file     Current Outpatient Medications:    ALPRAZolam (XANAX) 0.25 MG tablet, Take 1 tablet (0.25 mg total) by mouth 2 (two) times daily as needed for anxiety., Disp: 30 tablet, Rfl: 0   atorvastatin (LIPITOR) 80 MG tablet, Take 1 tablet (80 mg total) by mouth daily., Disp: 90  tablet, Rfl: 3   azithromycin (ZITHROMAX) 250 MG tablet, Take 2 tablets on day 1, then 1 tablet daily on days 2 through 5, Disp: 6 tablet, Rfl: 0   clopidogrel (PLAVIX) 75 MG tablet, Take 1 tablet (75 mg total) by mouth daily., Disp: 90 tablet, Rfl: 3   fenofibrate 160 MG tablet, TAKE 1 TABLET BY MOUTH DAILY, Disp: 90 tablet, Rfl: 1   furosemide (LASIX)  40 MG tablet, Take 1 tablet (40 mg total) by mouth 2 (two) times daily., Disp: 180 tablet, Rfl: 3   glimepiride (AMARYL) 2 MG tablet, TAKE 1/2 TABLET BY MOUTH IN THE MORNING, Disp: 45 tablet, Rfl: 7   glucose blood (TRUE METRIX BLOOD GLUCOSE TEST) test strip, 1 each by Other route daily. Use as instructed, Disp: 100 each, Rfl: 12   losartan (COZAAR) 50 MG tablet, Take 1 tablet (50 mg total) by mouth daily., Disp: 90 tablet, Rfl: 3   metFORMIN (GLUCOPHAGE) 1000 MG tablet, TAKE 1 TABLET BY MOUTH TWICE A DAY, Disp: 180 tablet, Rfl: 6   methylPREDNISolone (MEDROL DOSEPAK) 4 MG TBPK tablet, Use as directed, Disp: 21 tablet, Rfl: 0   metoprolol tartrate (LOPRESSOR) 25 MG tablet, Take 1 tablet (25 mg total) by mouth 2 (two) times daily., Disp: 180 tablet, Rfl: 3   nitroGLYCERIN (NITROSTAT) 0.4 MG SL tablet, Place 0.4 mg under the tongue every 5 (five) minutes as needed for chest pain., Disp: , Rfl:    pantoprazole (PROTONIX) 40 MG tablet, Take 1 tablet (40 mg total) by mouth daily before breakfast., Disp: 90 tablet, Rfl: 3   sildenafil (REVATIO) 20 MG tablet, TAKE ONE TABLET BY MOUTH DAILY AS NEEDED, Disp: 30 tablet, Rfl: 6   sildenafil (VIAGRA) 100 MG tablet, Take 1 tablet (100 mg total) by mouth daily as needed for erectile dysfunction., Disp: 20 tablet, Rfl: 0   spironolactone (ALDACTONE) 25 MG tablet, Take 1 tablet (25 mg total) by mouth daily., Disp: 30 tablet, Rfl: 3   traZODone (DESYREL) 50 MG tablet, TAKE 1 TABLET BY MOUTH EVERY DAY, Disp: 90 tablet, Rfl: 1   Allergies  Allergen Reactions   Ramipril Cough and Other (See Comments)    REACTION:  Cough   Pseudoephedrine-Guaifenesin Er Other (See Comments) and Hypertension    Elevated BP, insomnia Elevated BP, insomnia    ROS Review of Systems  Constitutional: Negative.   HENT: Negative.    Eyes: Negative.   Respiratory:  Positive for cough, shortness of breath and wheezing.   Cardiovascular: Negative.  Negative for leg swelling.  Gastrointestinal: Negative.  Negative for abdominal distention.  Endocrine: Negative.   Genitourinary: Negative.   Musculoskeletal: Negative.   Skin: Negative.   Allergic/Immunologic: Negative.   Neurological: Negative.  Negative for seizures, light-headedness and headaches.  Hematological: Negative.   Psychiatric/Behavioral:  The patient has insomnia.   All other systems reviewed and are negative.    Objective:    Physical Exam Vitals reviewed.  Constitutional:      Appearance: Normal appearance.  HENT:     Mouth/Throat:     Mouth: Mucous membranes are moist.     Pharynx: No oropharyngeal exudate.  Eyes:     Pupils: Pupils are equal, round, and reactive to light.  Neck:     Vascular: No carotid bruit.  Cardiovascular:     Rate and Rhythm: Normal rate and regular rhythm.     Pulses: Normal pulses.     Heart sounds: Normal heart sounds.    No friction rub.  Pulmonary:     Effort: Pulmonary effort is normal.     Breath sounds: Rhonchi present.  Chest:     Chest wall: No tenderness.  Abdominal:     General: Bowel sounds are normal.     Palpations: Abdomen is soft. There is no hepatomegaly, splenomegaly or mass.     Tenderness: There is no abdominal tenderness.     Hernia: No hernia is present.  Musculoskeletal:     Cervical back: Neck supple.     Right lower leg: No edema.     Left lower leg: No edema.  Skin:    Findings: No rash.  Neurological:     Mental Status: He is alert and oriented to person, place, and time.  Psychiatric:        Mood and Affect: Mood normal.        Behavior: Behavior normal.    BP (!) 145/58    Pulse 83   Ht 5\' 10"  (1.778 m)   Wt 250 lb 11.2 oz (113.7 kg)   BMI 35.97 kg/m  Wt Readings from Last 3 Encounters:  05/28/21 250 lb 11.2 oz (113.7 kg)  03/25/21 241 lb 4 oz (109.4 kg)  11/05/20 249 lb 12.8 oz (113.3 kg)     Health Maintenance Due  Topic Date Due   COVID-19 Vaccine (1) Never done   Hepatitis C Screening  Never done   Zoster Vaccines- Shingrix (1 of 2) Never done   FOOT EXAM  11/21/2015    There are no preventive care reminders to display for this patient.  Lab Results  Component Value Date   TSH 1.66 06/12/2020   Lab Results  Component Value Date   WBC 8.5 06/12/2020   HGB 13.2 06/12/2020   HCT 40.4 06/12/2020   MCV 79.1 (L) 06/12/2020   PLT 307 06/12/2020   Lab Results  Component Value Date   NA 142 06/12/2020   K 5.2 06/12/2020   CO2 23 06/12/2020   GLUCOSE 100 (H) 06/12/2020   BUN 27 (H) 06/12/2020   CREATININE 1.96 (H) 06/12/2020   BILITOT 0.3 06/12/2020   ALKPHOS 25 (L) 12/07/2019   AST 17 06/12/2020   ALT 16 06/12/2020   PROT 6.8 06/12/2020   ALBUMIN 3.8 12/07/2019   CALCIUM 9.7 06/12/2020   ANIONGAP 10 12/14/2019   GFR 65.78 11/15/2014   Lab Results  Component Value Date   CHOL 165 06/12/2020   Lab Results  Component Value Date   HDL 34 (L) 06/12/2020   Lab Results  Component Value Date   LDLCALC 100 (H) 06/12/2020   Lab Results  Component Value Date   TRIG 185 (H) 06/12/2020   Lab Results  Component Value Date   CHOLHDL 4.9 06/12/2020   Lab Results  Component Value Date   HGBA1C 6.6 03/05/2021      Assessment & Plan:   Problem List Items Addressed This Visit       Cardiovascular and Mediastinum   Essential hypertension     Patient denies any chest pain or shortness of breath there is no history of palpitation or paroxysmal nocturnal dyspnea   patient was advised to follow low-salt low-cholesterol diet    ideally I want to keep systolic blood pressure below 130 mmHg, patient was asked to check blood  pressure one times a week and give me a report on that.  Patient will be follow-up in 3 months  or earlier as needed, patient will call me back for any change in the cardiovascular symptoms Patient was advised to buy a book from local bookstore concerning blood pressure and read several chapters  every day.  This will be supplemented by some of the material we will give him from the office.  Patient should also utilize other resources like YouTube and Internet to learn more about the blood pressure and the diet.      CAD (coronary artery disease)  There is no history of angina no swelling of the legs no signs or symptoms of congestive heart failure        Respiratory   Obstructive sleep apnea syndrome    Patient is not using the CPAP machine is sleeping on his side he was advised to quit smoking and lose weight, try to do another sleep study.        Other   Smoker    - I instructed the patient to stop smoking and provided them with smoking cessation materials.  - I informed the patient that smoking puts them at increased risk for cancer, COPD, hypertension, and more.  - Informed the patient to seek help if they begin to have trouble breathing, develop chest pain, start to cough up blood, feel faint, or pass out.      Morbid obesity ([place])    - I encouraged the patient to lose weight.  - I educated them on making healthy dietary choices including eating more fruits and vegetables and less fried foods. - I encouraged the patient to exercise more, and educated on the benefits of exercise including weight loss, diabetes prevention, and hypertension prevention.   Dietary counseling with a registered dietician  Referral to a weight management support group (e.g. Weight Watchers, Overeaters Anonymous)  If your BMI is greater than 29 or you have gained more than 15 pounds you should work on weight loss.  Attend a healthy cooking class       S/P CABG x 4    Stable no angina      Anxiety      Keeping a stress/anxiety diary. This can help you learn what triggers your reaction and then learn ways to manage your response.  Thinking about how you react to certain situations. You may not be able to control everything, but you can control your response.  Making time for activities that help you relax and not feeling guilty about spending your time in this way.  Visual imagery and yoga can help you stay calm and relax.      Relevant Medications   ALPRAZolam (XANAX) 0.25 MG tablet   Other Visit Diagnoses     Frequency of urination    -  Primary   Relevant Orders   POCT urinalysis dipstick (Completed)       Meds ordered this encounter  Medications   ALPRAZolam (XANAX) 0.25 MG tablet    Sig: Take 1 tablet (0.25 mg total) by mouth 2 (two) times daily as needed for anxiety.    Dispense:  30 tablet    Refill:  0   methylPREDNISolone (MEDROL DOSEPAK) 4 MG TBPK tablet    Sig: Use as directed    Dispense:  21 tablet    Refill:  0   azithromycin (ZITHROMAX) 250 MG tablet    Sig: Take 2 tablets on day 1, then 1 tablet daily on days 2 through 5    Dispense:  6 tablet    Refill:  0    Follow-up: No follow-ups on file.    [name], MD

## 2021-06-05 ENCOUNTER — Encounter: Payer: Medicare Other | Admitting: Internal Medicine

## 2021-06-12 ENCOUNTER — Ambulatory Visit (INDEPENDENT_AMBULATORY_CARE_PROVIDER_SITE_OTHER): Payer: Medicare Other | Admitting: Internal Medicine

## 2021-06-12 ENCOUNTER — Other Ambulatory Visit: Payer: Self-pay

## 2021-06-12 ENCOUNTER — Encounter: Payer: Self-pay | Admitting: Internal Medicine

## 2021-06-12 VITALS — BP 135/55 | HR 61 | Ht 70.0 in | Wt 251.4 lb

## 2021-06-12 DIAGNOSIS — E782 Mixed hyperlipidemia: Secondary | ICD-10-CM | POA: Diagnosis not present

## 2021-06-12 DIAGNOSIS — F419 Anxiety disorder, unspecified: Secondary | ICD-10-CM | POA: Diagnosis not present

## 2021-06-12 DIAGNOSIS — E119 Type 2 diabetes mellitus without complications: Secondary | ICD-10-CM | POA: Diagnosis not present

## 2021-06-12 DIAGNOSIS — R0602 Shortness of breath: Secondary | ICD-10-CM

## 2021-06-12 DIAGNOSIS — G4733 Obstructive sleep apnea (adult) (pediatric): Secondary | ICD-10-CM | POA: Diagnosis not present

## 2021-06-12 DIAGNOSIS — E1169 Type 2 diabetes mellitus with other specified complication: Secondary | ICD-10-CM

## 2021-06-12 DIAGNOSIS — R350 Frequency of micturition: Secondary | ICD-10-CM | POA: Diagnosis not present

## 2021-06-12 DIAGNOSIS — Z1329 Encounter for screening for other suspected endocrine disorder: Secondary | ICD-10-CM | POA: Diagnosis not present

## 2021-06-12 DIAGNOSIS — F17200 Nicotine dependence, unspecified, uncomplicated: Secondary | ICD-10-CM

## 2021-06-12 DIAGNOSIS — Z1159 Encounter for screening for other viral diseases: Secondary | ICD-10-CM

## 2021-06-12 DIAGNOSIS — I10 Essential (primary) hypertension: Secondary | ICD-10-CM

## 2021-06-12 DIAGNOSIS — N521 Erectile dysfunction due to diseases classified elsewhere: Secondary | ICD-10-CM

## 2021-06-12 DIAGNOSIS — Z0000 Encounter for general adult medical examination without abnormal findings: Secondary | ICD-10-CM

## 2021-06-12 NOTE — Assessment & Plan Note (Signed)
Patient wants to have a home sleep study done

## 2021-06-12 NOTE — Assessment & Plan Note (Signed)

## 2021-06-12 NOTE — Progress Notes (Signed)
Established Patient Office Visit  Subjective:  Patient ID: Joshua Roy, male    DOB: 18-Aug-1950  Age: 70 y.o. MRN: [id number]  CC:  Chief Complaint  Patient presents with   Follow-up    Two week follow up and labs    HPI  Joshua Roy presents for check up  Past Medical History:  Diagnosis Date   (HFpEF) heart failure with preserved ejection fraction ([place])    a. 07/2019 Echo: EF 60-65%. Mod LVH. Gr1 DD. No rwma. Nl RV size/fxn. Mildly dil LA; b. 11/2019 Echo: EF 55-60%, no rwma, mild LVH. Nl RV size/fxn. Mod dil LA.   ACE-inhibitor cough    Anxiety    Bronchitis 06/27/2016   Carotid arterial disease ([place])    a. 09/2019 Carotid U/S: RICA 00-34%, LICA 9-17%; b. 03/1504 R CEA.   CKD (chronic kidney disease), stage III (HCC)    COPD (chronic obstructive pulmonary disease) (HCC)    cath, mild inf. hypokinesis EF 49%, 100% ROA   Coronary artery disease    a. 2001 Cath: LM 10, LAD 60p/m, D1 30, LCX 20p, RCA 174m/d-->Med Rx; b. 05/2017 Ex Mv: Ex time 5:46, antlat twi @ rest, fixed infarct w/ mild peri-infarct ischemia. EF 38%; c. 08/2019 Cath: LM 40ost, 70d, LAD 71m, LCX 38m, RCA 80-90p, 174m; d. 09/2019 CABG x4: LIMA->LAD, VG->RI, VG->OM, VG->RPDA.   Diabetes mellitus type II    Hyperlipidemia    Hypertension    MI (myocardial infarction) [facility]) age 17   OA (osteoarthritis)    knee OA, injected 2012 by ortho   PAF (paroxysmal atrial fibrillation) ([place])    a. 09/2019 post-op CABG-->[facility].   Pneumonia    PSVT (paroxysmal supraventricular tachycardia) ([place])    a. 08/2019 Zio: 189 brief runs of SVT (fastest 174 x 6 beats, longest 15 beats @ 107).   Tobacco abuse     Past Surgical History:  Procedure Laterality Date   CARDIAC CATHETERIZATION  05/06/2000   @ [place]   COLONOSCOPY WITH PROPOFOL N/A 04/20/2018   Procedure: COLONOSCOPY WITH PROPOFOL;  Surgeon: [name], MD;  Location: [facility] ENDOSCOPY;  Service: Endoscopy;  Laterality: N/A;   CORONARY ARTERY BYPASS GRAFT N/A  10/07/2019   Procedure: CORONARY ARTERY BYPASS GRAFTING (CABG) using LIMA to LAD; Endoscopic harvesting of left greater saphenous vein: SVG to RAMUS; SVG to OM1; SVG to PDA.;  Surgeon: Gaye Pollack, MD;  Location: [facility] OR;  Service: Open Heart Surgery;  Laterality: N/A;   ENDARTERECTOMY Right 10/07/2019   Procedure: ENDARTERECTOMY CAROTID;  Surgeon: Rosetta Posner, MD;  Location[facility] OR;  Service: Vascular;  Laterality: Right;   ENDOVEIN HARVEST OF GREATER SAPHENOUS VEIN Left 10/07/2019   Procedure: [place] Of Greater Saphenous Vein;  Surgeon: Gaye Pollack, MD;  Location: [facility] OR;  Service: Open Heart Surgery;  Laterality: Left;   ESOPHAGOGASTRODUODENOSCOPY ENDOSCOPY     KNEE ARTHROSCOPY  07/25/04   left   [place]   right, open surgery- repair   RIGHT/LEFT HEART CATH AND CORONARY ANGIOGRAPHY Bilateral 09/08/2019   Procedure: RIGHT/LEFT HEART CATH AND CORONARY ANGIOGRAPHY;  Surgeon: Minna Merritts, MD;  Location: [place] CV LAB;  Service: Cardiovascular;  Laterality: Bilateral;   TEE WITHOUT CARDIOVERSION N/A 10/07/2019   Procedure: TRANSESOPHAGEAL ECHOCARDIOGRAM (TEE);  Surgeon: Gaye Pollack, MD;  Location: [place];  Service: Open Heart Surgery;  Laterality: N/A;   TOTAL KNEE ARTHROPLASTY Left 07/14/2016   Procedure: LEFT TOTAL KNEE ARTHROPLASTY;  Surgeon: Pilar Plate  Aluisio, MD;  Location: [facility] ORS;  Service: Orthopedics;  Laterality: Left;   TOTAL KNEE ARTHROPLASTY Right 07/13/2017   Procedure: RIGHT TOTAL KNEE ARTHROPLASTY;  Surgeon: Gaynelle Arabian, MD;  Location: [facility] ORS;  Service: Orthopedics;  Laterality: Right;    Family History  Problem Relation Age of Onset   Heart disease Father        CAD, MI x 3   Hypertension Father    Alcohol abuse Father    Diabetes Father    Diabetes Sister    Cancer Brother        lymphoma, in remission   Colon cancer Brother    Heart disease Sister        CABG x 3   Prostate cancer Neg Hx     Social History   Socioeconomic  History   Marital status: Married    Spouse name: Janie   Number of children: 3   Years of education: Not on file   Highest education level: Not on file  Occupational History   Occupation: Engineer, manufacturing, Estate agent  Tobacco Use   Smoking status: Every Day    Packs/day: 1.00    Years: 40.00    Pack years: 40.00    Types: Cigarettes    Last attempt to quit: 10/07/2019    Years since quitting: 1.6   Smokeless tobacco: Never   Tobacco comments:    per patient wears a patch and has cut down on cigarettes/day (10/05/2019)  Vaping Use   Vaping Use: Never used  Substance and Sexual Activity   Alcohol use: Yes    Alcohol/week: 0.0 standard drinks    Comment: 5-6 cocktails, 1 times a week   Drug use: No   Sexual activity: Not on file  Other Topics Concern   Not on file  Social History Narrative   From [place].  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   Social Determinants of Health   Financial Resource Strain: Not on file  Food Insecurity: Not on file  Transportation Needs: Not on file  Physical Activity: Not on file  Stress: Not on file  Social Connections: Not on file  Intimate Partner Violence: Not on file     Current Outpatient Medications:    ALPRAZolam (XANAX) 0.25 MG tablet, Take 1 tablet (0.25 mg total) by mouth 2 (two) times daily as needed for anxiety., Disp: 30 tablet, Rfl: 0   atorvastatin (LIPITOR) 80 MG tablet, Take 1 tablet (80 mg total) by mouth daily., Disp: 90 tablet, Rfl: 3   clopidogrel (PLAVIX) 75 MG tablet, Take 1 tablet (75 mg total) by mouth daily., Disp: 90 tablet, Rfl: 3   fenofibrate 160 MG tablet, TAKE 1 TABLET BY MOUTH DAILY, Disp: 90 tablet, Rfl: 1   furosemide (LASIX) 40 MG tablet, Take 1 tablet (40 mg total) by mouth 2 (two) times daily., Disp: 180 tablet, Rfl: 3   glimepiride (AMARYL) 2 MG tablet, TAKE 1/2 TABLET BY MOUTH IN THE MORNING, Disp: 45 tablet, Rfl: 7   glucose blood (TRUE METRIX BLOOD GLUCOSE TEST) test strip, 1 each by  Other route daily. Use as instructed, Disp: 100 each, Rfl: 12   losartan (COZAAR) 50 MG tablet, Take 1 tablet (50 mg total) by mouth daily., Disp: 90 tablet, Rfl: 3   metFORMIN (GLUCOPHAGE) 1000 MG tablet, TAKE 1 TABLET BY MOUTH TWICE A DAY, Disp: 180 tablet, Rfl: 6   methylPREDNISolone (MEDROL DOSEPAK) 4 MG TBPK tablet, Use as directed, Disp: 21 tablet, Rfl:  0   metoprolol tartrate (LOPRESSOR) 25 MG tablet, Take 1 tablet (25 mg total) by mouth 2 (two) times daily., Disp: 180 tablet, Rfl: 3   nitroGLYCERIN (NITROSTAT) 0.4 MG SL tablet, Place 0.4 mg under the tongue every 5 (five) minutes as needed for chest pain., Disp: , Rfl:    pantoprazole (PROTONIX) 40 MG tablet, Take 1 tablet (40 mg total) by mouth daily before breakfast., Disp: 90 tablet, Rfl: 3   sildenafil (REVATIO) 20 MG tablet, TAKE ONE TABLET BY MOUTH DAILY AS NEEDED, Disp: 30 tablet, Rfl: 6   sildenafil (VIAGRA) 100 MG tablet, Take 1 tablet (100 mg total) by mouth daily as needed for erectile dysfunction., Disp: 20 tablet, Rfl: 0   spironolactone (ALDACTONE) 25 MG tablet, Take 1 tablet (25 mg total) by mouth daily., Disp: 30 tablet, Rfl: 3   traZODone (DESYREL) 50 MG tablet, TAKE 1 TABLET BY MOUTH EVERY DAY, Disp: 90 tablet, Rfl: 1   Allergies  Allergen Reactions   Ramipril Cough and Other (See Comments)    REACTION: Cough   Pseudoephedrine-Guaifenesin Er Other (See Comments) and Hypertension    Elevated BP, insomnia Elevated BP, insomnia    ROS Review of Systems  Constitutional: Negative.   HENT: Negative.    Eyes: Negative.   Respiratory: Negative.    Cardiovascular: Negative.   Gastrointestinal: Negative.   Endocrine: Negative.   Genitourinary: Negative.   Musculoskeletal: Negative.   Skin: Negative.   Allergic/Immunologic: Negative.   Neurological: Negative.   Hematological: Negative.   Psychiatric/Behavioral: Negative.    All other systems reviewed and are negative.    Objective:    Physical Exam Vitals  reviewed.  Constitutional:      Appearance: Normal appearance.  HENT:     Mouth/Throat:     Mouth: Mucous membranes are moist.  Eyes:     Pupils: Pupils are equal, round, and reactive to light.  Neck:     Vascular: No carotid bruit.  Cardiovascular:     Rate and Rhythm: Normal rate and regular rhythm.     Pulses: Normal pulses.     Heart sounds: Normal heart sounds.  Pulmonary:     Effort: Pulmonary effort is normal.     Breath sounds: Normal breath sounds.  Abdominal:     General: Bowel sounds are normal.     Palpations: Abdomen is soft. There is no hepatomegaly, splenomegaly or mass.     Tenderness: There is no abdominal tenderness.     Hernia: No hernia is present.  Musculoskeletal:     Cervical back: Neck supple.     Right lower leg: No edema.     Left lower leg: No edema.  Skin:    Findings: No rash.  Neurological:     Mental Status: He is alert and oriented to person, place, and time.     Motor: No weakness.  Psychiatric:        Mood and Affect: Mood normal.        Behavior: Behavior normal.    BP (!) 135/55   Pulse 61   Ht 5\' 10"  (1.778 m)   Wt 251 lb 6.4 oz (114 kg)   BMI 36.07 kg/m  Wt Readings from Last 3 Encounters:  06/12/21 251 lb 6.4 oz (114 kg)  05/28/21 250 lb 11.2 oz (113.7 kg)  03/25/21 241 lb 4 oz (109.4 kg)     Health Maintenance Due  Topic Date Due   COVID-19 Vaccine (1) Never done   Hepatitis C  Screening  Never done   Zoster Vaccines- Shingrix (1 of 2) Never done   FOOT EXAM  11/21/2015    There are no preventive care reminders to display for this patient.  Lab Results  Component Value Date   TSH 1.66 06/12/2020   Lab Results  Component Value Date   WBC 8.5 06/12/2020   HGB 13.2 06/12/2020   HCT 40.4 06/12/2020   MCV 79.1 (L) 06/12/2020   PLT 307 06/12/2020   Lab Results  Component Value Date   NA 142 06/12/2020   K 5.2 06/12/2020   CO2 23 06/12/2020   GLUCOSE 100 (H) 06/12/2020   BUN 27 (H) 06/12/2020   CREATININE  1.96 (H) 06/12/2020   BILITOT 0.3 06/12/2020   ALKPHOS 25 (L) 12/07/2019   AST 17 06/12/2020   ALT 16 06/12/2020   PROT 6.8 06/12/2020   ALBUMIN 3.8 12/07/2019   CALCIUM 9.7 06/12/2020   ANIONGAP 10 12/14/2019   GFR 65.78 11/15/2014   Lab Results  Component Value Date   CHOL 165 06/12/2020   Lab Results  Component Value Date   HDL 34 (L) 06/12/2020   Lab Results  Component Value Date   LDLCALC 100 (H) 06/12/2020   Lab Results  Component Value Date   TRIG 185 (H) 06/12/2020   Lab Results  Component Value Date   CHOLHDL 4.9 06/12/2020   Lab Results  Component Value Date   HGBA1C 6.6 03/05/2021      Assessment & Plan:   Problem List Items Addressed This Visit       Cardiovascular and Mediastinum   Essential hypertension - Primary     Patient denies any chest pain or shortness of breath there is no history of palpitation or paroxysmal nocturnal dyspnea   patient was advised to follow low-salt low-cholesterol diet    ideally I want to keep systolic blood pressure below 130 mmHg, patient was asked to check blood pressure one times a week and give me a report on that.  Patient will be follow-up in 3 months  or earlier as needed, patient will call me back for any change in the cardiovascular symptoms Patient was advised to buy a book from local bookstore concerning blood pressure and read several chapters  every day.  This will be supplemented by some of the material we will give him from the office.  Patient should also utilize other resources like YouTube and Internet to learn more about the blood pressure and the diet.        Respiratory   Obstructive sleep apnea syndrome    Patient wants to have a home sleep study done        Other   Hyperlipidemia    Hypercholesterolemia  I advised the patient to follow Mediterranean diet This diet is rich in fruits vegetables and whole grain, and This diet is also rich in fish and lean meat Patient should also eat a  handful of almonds or walnuts daily Recent heart study indicated that average follow-up on this kind of diet reduces the cardiovascular mortality by 50 to 70%==      Smoker    - I instructed the patient to stop smoking and provided them with smoking cessation materials.  - I informed the patient that smoking puts them at increased risk for cancer, COPD, hypertension, and more.  - Informed the patient to seek help if they begin to have trouble breathing, develop chest pain, start to cough up blood, feel faint, or  pass out.      Morbid obesity ([place])    Behavioral modification strategies: increasing lean protein intake, decreasing simple carbohydrates, increasing vegetables, increasing water intake, decreasing eating out, no skipping meals, meal planning and cooking strategies, keeping healthy foods in the home and planning for success.      Anxiety     Keeping a stress/anxiety diary. This can help you learn what triggers your reaction and then learn ways to manage your response.  Thinking about how you react to certain situations. You may not be able to control everything, but you can control your response.  Making time for activities that help you relax and not feeling guilty about spending your time in this way.  Visual imagery and yoga can help you stay calm and relax.       No orders of the defined types were placed in this encounter.   Follow-up: No follow-ups on file.    [name], MD

## 2021-06-12 NOTE — Assessment & Plan Note (Signed)
   Keeping a stress/anxiety diary. This can help you learn what triggers your reaction and then learn ways to manage your response.  Thinking about how you react to certain situations. You may not be able to control everything, but you can control your response.  Making time for activities that help you relax and not feeling guilty about spending your time in this way.  Visual imagery and yoga can help you stay calm and relax. 

## 2021-06-12 NOTE — Assessment & Plan Note (Signed)
Behavioral modification strategies: increasing lean protein intake, decreasing simple carbohydrates, increasing vegetables, increasing water intake, decreasing eating out, no skipping meals, meal planning and cooking strategies, keeping healthy foods in the home and planning for success. 

## 2021-06-12 NOTE — Assessment & Plan Note (Signed)
Hypercholesterolemia  I advised the patient to follow Mediterranean diet This diet is rich in fruits vegetables and whole grain, and This diet is also rich in fish and lean meat Patient should also eat a handful of almonds or walnuts daily Recent heart study indicated that average follow-up on this kind of diet reduces the cardiovascular mortality by 50 to 70%== 

## 2021-06-12 NOTE — Addendum Note (Signed)
Addended by: Lacretia Nicks L on: 06/12/2021 10:50 PM   Modules accepted: Orders

## 2021-06-12 NOTE — Assessment & Plan Note (Signed)
-   I instructed the patient to stop smoking and provided them with smoking cessation materials.  - I informed the patient that smoking puts them at increased risk for cancer, COPD, hypertension, and more.  - Informed the patient to seek help if they begin to have trouble breathing, develop chest pain, start to cough up blood, feel faint, or pass out.  

## 2021-06-13 DIAGNOSIS — E119 Type 2 diabetes mellitus without complications: Secondary | ICD-10-CM | POA: Diagnosis not present

## 2021-06-13 DIAGNOSIS — Z1159 Encounter for screening for other viral diseases: Secondary | ICD-10-CM | POA: Diagnosis not present

## 2021-06-13 DIAGNOSIS — E782 Mixed hyperlipidemia: Secondary | ICD-10-CM | POA: Diagnosis not present

## 2021-06-13 DIAGNOSIS — I10 Essential (primary) hypertension: Secondary | ICD-10-CM | POA: Diagnosis not present

## 2021-06-13 DIAGNOSIS — R350 Frequency of micturition: Secondary | ICD-10-CM | POA: Diagnosis not present

## 2021-06-14 ENCOUNTER — Other Ambulatory Visit: Payer: Self-pay | Admitting: *Deleted

## 2021-06-14 ENCOUNTER — Ambulatory Visit (INDEPENDENT_AMBULATORY_CARE_PROVIDER_SITE_OTHER): Payer: Medicare Other | Admitting: *Deleted

## 2021-06-14 DIAGNOSIS — Z0000 Encounter for general adult medical examination without abnormal findings: Secondary | ICD-10-CM

## 2021-06-14 DIAGNOSIS — Z122 Encounter for screening for malignant neoplasm of respiratory organs: Secondary | ICD-10-CM

## 2021-06-14 DIAGNOSIS — F17210 Nicotine dependence, cigarettes, uncomplicated: Secondary | ICD-10-CM

## 2021-06-14 DIAGNOSIS — F17200 Nicotine dependence, unspecified, uncomplicated: Secondary | ICD-10-CM

## 2021-06-14 LAB — COMPLETE METABOLIC PANEL WITH GFR
AG Ratio: 1.7 (calc) (ref 1.0–2.5)
ALT: 12 U/L (ref 9–46)
AST: 14 U/L (ref 10–35)
Albumin: 4 g/dL (ref 3.6–5.1)
Alkaline phosphatase (APISO): 40 U/L (ref 35–144)
BUN/Creatinine Ratio: 14 (calc) (ref 6–22)
BUN: 32 mg/dL — ABNORMAL HIGH (ref 7–25)
CO2: 27 mmol/L (ref 20–32)
Calcium: 8.9 mg/dL (ref 8.6–10.3)
Chloride: 97 mmol/L — ABNORMAL LOW (ref 98–110)
Creat: 2.25 mg/dL — ABNORMAL HIGH (ref 0.70–1.28)
Globulin: 2.3 g/dL (calc) (ref 1.9–3.7)
Glucose, Bld: 118 mg/dL — ABNORMAL HIGH (ref 65–99)
Sodium: 135 mmol/L (ref 135–146)
Total Bilirubin: 0.3 mg/dL (ref 0.2–1.2)
Total Protein: 6.3 g/dL (ref 6.1–8.1)
eGFR: 30 mL/min/{1.73_m2} — ABNORMAL LOW (ref >=60)

## 2021-06-14 LAB — HEPATITIS C ANTIBODY
Hepatitis C Ab: NONREACTIVE
SIGNAL TO CUT-OFF: 0.02 (ref <1.00)

## 2021-06-14 LAB — PSA: PSA: 0.83 ng/mL (ref <=4.00)

## 2021-06-14 LAB — CBC WITH DIFFERENTIAL/PLATELET
Absolute Monocytes: 707 cells/uL (ref 200–950)
Basophils Absolute: 76 cells/uL (ref 0–200)
Basophils Relative: 1 %
Eosinophils Absolute: 0 cells/uL — ABNORMAL LOW (ref 15–500)
Eosinophils Relative: 0 %
HCT: 41.9 % (ref 38.5–50.0)
Hemoglobin: 12.7 g/dL — ABNORMAL LOW (ref 13.2–17.1)
Lymphs Abs: 1756 cells/uL (ref 850–3900)
MCH: 23.7 pg — ABNORMAL LOW (ref 27.0–33.0)
MCHC: 30.3 g/dL — ABNORMAL LOW (ref 32.0–36.0)
MCV: 78.3 fL — ABNORMAL LOW (ref 80.0–100.0)
MPV: 10.2 fL (ref 7.5–12.5)
Monocytes Relative: 9.3 %
Neutro Abs: 5062 cells/uL (ref 1500–7800)
Neutrophils Relative %: 66.6 %
Platelets: 351 10*3/uL (ref 140–400)
RBC: 5.35 10*6/uL (ref 4.20–5.80)
RDW: 15.5 % — ABNORMAL HIGH (ref 11.0–15.0)
Total Lymphocyte: 23.1 %
WBC: 7.6 10*3/uL (ref 3.8–10.8)

## 2021-06-14 LAB — LIPID PANEL
Cholesterol: 124 mg/dL (ref <200)
HDL: 39 mg/dL — ABNORMAL LOW (ref >=40)
LDL Cholesterol (Calc): 63 mg/dL (calc)
Non-HDL Cholesterol (Calc): 85 mg/dL (calc) (ref <130)
Total CHOL/HDL Ratio: 3.2 (calc) (ref <5.0)
Triglycerides: 138 mg/dL (ref <150)

## 2021-06-14 LAB — HEMOGLOBIN A1C
Hgb A1c MFr Bld: 7.3 % of total Hgb — ABNORMAL HIGH (ref <5.7)
Mean Plasma Glucose: 163 mg/dL
eAG (mmol/L): 9 mmol/L

## 2021-06-14 LAB — SPECIMEN COMPROMISED

## 2021-06-14 MED ORDER — CIPROFLOXACIN HCL 500 MG PO TABS: 500.0000 mg | ORAL_TABLET | Freq: Two times a day (BID) | ORAL | 0 refills | Status: AC

## 2021-06-14 NOTE — Progress Notes (Signed)
Subjective:   Joshua Roy is a 70 y.o. male who presents for Medicare Annual/Subsequent preventive examination.  I discussed the limitations of evaluation and management by telemedicine and the availability of in person appointments. Patient expressed understanding and agreed to proceed.   Visit performed using audio  Patient:home Provider:home   Review of Systems    Defer to provider  Cardiac Risk Factors include: diabetes mellitus;advanced age (>61men, >28 women);male gender;obesity (BMI >30kg/m2);smoking/ tobacco exposure     Objective:    Today's Vitals   06/14/21 0837  PainSc: 7    There is no height or weight on file to calculate BMI.  Advanced Directives 06/14/2021 12/23/2019 12/11/2019 12/07/2019 10/08/2019 10/05/2019 08/02/2019  Does Patient Have a Medical Advance Directive? No Yes Yes Yes Yes No;Yes No  Type of Advance Directive - Living will Living will - [place];Living will [place];Living will -  Does patient want to make changes to medical advance directive? - No - Patient declined No - Patient declined - No - Patient declined - -  Copy of Doctor Phillips in Chart? - - - - No - copy requested No - copy requested -  Would patient like information on creating a medical advance directive? No - Patient declined - - - - - -    Current Medications (verified) Outpatient Encounter Medications as of 06/14/2021  Medication Sig   ALPRAZolam (XANAX) 0.25 MG tablet Take 1 tablet (0.25 mg total) by mouth 2 (two) times daily as needed for anxiety.   atorvastatin (LIPITOR) 80 MG tablet Take 1 tablet (80 mg total) by mouth daily.   clopidogrel (PLAVIX) 75 MG tablet Take 1 tablet (75 mg total) by mouth daily.   fenofibrate 160 MG tablet TAKE 1 TABLET BY MOUTH DAILY   furosemide (LASIX) 40 MG tablet Take 1 tablet (40 mg total) by mouth 2 (two) times daily.   glimepiride (AMARYL) 2 MG tablet TAKE 1/2 TABLET BY MOUTH IN THE  MORNING   glucose blood (TRUE METRIX BLOOD GLUCOSE TEST) test strip 1 each by Other route daily. Use as instructed   losartan (COZAAR) 50 MG tablet Take 1 tablet (50 mg total) by mouth daily.   metFORMIN (GLUCOPHAGE) 1000 MG tablet TAKE 1 TABLET BY MOUTH TWICE A DAY   metoprolol tartrate (LOPRESSOR) 25 MG tablet Take 1 tablet (25 mg total) by mouth 2 (two) times daily.   nitroGLYCERIN (NITROSTAT) 0.4 MG SL tablet Place 0.4 mg under the tongue every 5 (five) minutes as needed for chest pain.   pantoprazole (PROTONIX) 40 MG tablet Take 1 tablet (40 mg total) by mouth daily before breakfast.   sildenafil (REVATIO) 20 MG tablet TAKE ONE TABLET BY MOUTH DAILY AS NEEDED   sildenafil (VIAGRA) 100 MG tablet Take 1 tablet (100 mg total) by mouth daily as needed for erectile dysfunction.   spironolactone (ALDACTONE) 25 MG tablet Take 1 tablet (25 mg total) by mouth daily.   traZODone (DESYREL) 50 MG tablet TAKE 1 TABLET BY MOUTH EVERY DAY   [DISCONTINUED] methylPREDNISolone (MEDROL DOSEPAK) 4 MG TBPK tablet Use as directed   No facility-administered encounter medications on file as of 06/14/2021.    Allergies (verified) Ramipril and Pseudoephedrine-guaifenesin er   History: Past Medical History:  Diagnosis Date   (HFpEF) heart failure with preserved ejection fraction ([place])    a. 07/2019 Echo: EF 60-65%. Mod LVH. Gr1 DD. No rwma. Nl RV size/fxn. Mildly dil LA; b. 11/2019 Echo: EF 55-60%,  no rwma, mild LVH. Nl RV size/fxn. Mod dil LA.   ACE-inhibitor cough    Anxiety    Bronchitis 06/27/2016   Carotid arterial disease ([place])    a. 09/2019 Carotid U/S: RICA 29-92%, LICA 4-26%; b. 02/3418 R CEA.   CKD (chronic kidney disease), stage III (HCC)    COPD (chronic obstructive pulmonary disease) (HCC)    cath, mild inf. hypokinesis EF 49%, 100% ROA   Coronary artery disease    a. 2001 Cath: LM 10, LAD 60p/m, D1 30, LCX 20p, RCA 167m/d-->Med Rx; b. 05/2017 Ex Mv: Ex time 5:46, antlat twi @ rest, fixed infarct  w/ mild peri-infarct ischemia. EF 38%; c. 08/2019 Cath: LM 40ost, 70d, LAD 56m, LCX 5m, RCA 80-90p, 147m; d. 09/2019 CABG x4: LIMA->LAD, VG->RI, VG->OM, VG->RPDA.   Diabetes mellitus type II    Hyperlipidemia    Hypertension    MI (myocardial infarction) [facility]) age 52   OA (osteoarthritis)    knee OA, injected 2012 by ortho   PAF (paroxysmal atrial fibrillation) ([place])    a. 09/2019 post-op CABG-->[facility].   Pneumonia    PSVT (paroxysmal supraventricular tachycardia) ([place])    a. 08/2019 Zio: 189 brief runs of SVT (fastest 174 x 6 beats, longest 15 beats @ 107).   Tobacco abuse    Past Surgical History:  Procedure Laterality Date   CARDIAC CATHETERIZATION  05/06/2000   @    COLONOSCOPY WITH PROPOFOL N/A 04/20/2018   Procedure: COLONOSCOPY WITH PROPOFOL;  Surgeon: [name], MD;  Location: [facility] ENDOSCOPY;  Service: Endoscopy;  Laterality: N/A;   CORONARY ARTERY BYPASS GRAFT N/A 10/07/2019   Procedure: CORONARY ARTERY BYPASS GRAFTING (CABG) using LIMA to LAD; Endoscopic harvesting of left greater saphenous vein: SVG to RAMUS; SVG to OM1; SVG to PDA.;  Surgeon: Gaye Pollack, MD;  Location: [facility] OR;  Service: Open Heart Surgery;  Laterality: N/A;   ENDARTERECTOMY Right 10/07/2019   Procedure: ENDARTERECTOMY CAROTID;  Surgeon: Rosetta Posner, MD;  Location: [facility] OR;  Service: Vascular;  Laterality: Right;   ENDOVEIN HARVEST OF GREATER SAPHENOUS VEIN Left 10/07/2019   Procedure: [place] Of Greater Saphenous Vein;  Surgeon: Gaye Pollack, MD;  Location: [facility] OR;  Service: Open Heart Surgery;  Laterality: Left;   ESOPHAGOGASTRODUODENOSCOPY ENDOSCOPY     KNEE ARTHROSCOPY  07/25/04   left   [place]   right, open surgery- repair   RIGHT/LEFT HEART CATH AND CORONARY ANGIOGRAPHY Bilateral 09/08/2019   Procedure: RIGHT/LEFT HEART CATH AND CORONARY ANGIOGRAPHY;  Surgeon: Minna Merritts, MD;  Location: [place] CV LAB;  Service: Cardiovascular;  Laterality: Bilateral;   TEE  WITHOUT CARDIOVERSION N/A 10/07/2019   Procedure: TRANSESOPHAGEAL ECHOCARDIOGRAM (TEE);  Surgeon: Gaye Pollack, MD;  Location: [place];  Service: Open Heart Surgery;  Laterality: N/A;   TOTAL KNEE ARTHROPLASTY Left 07/14/2016   Procedure: LEFT TOTAL KNEE ARTHROPLASTY;  Surgeon: Gaynelle Arabian, MD;  Location: [facility] ORS;  Service: Orthopedics;  Laterality: Left;   TOTAL KNEE ARTHROPLASTY Right 07/13/2017   Procedure: RIGHT TOTAL KNEE ARTHROPLASTY;  Surgeon: Gaynelle Arabian, MD;  Location: [facility] ORS;  Service: Orthopedics;  Laterality: Right;   Family History  Problem Relation Age of Onset   Heart disease Father        CAD, MI x 3   Hypertension Father    Alcohol abuse Father    Diabetes Father    Diabetes Sister    Cancer Brother        lymphoma,  in remission   Colon cancer Brother    Heart disease Sister        CABG x 3   Prostate cancer Neg Hx    Social History   Socioeconomic History   Marital status: Married    Spouse name: Janie   Number of children: 3   Years of education: Not on file   Highest education level: Not on file  Occupational History   Occupation: Engineer, manufacturing, Estate agent  Tobacco Use   Smoking status: Every Day    Packs/day: 1.00    Years: 40.00    Pack years: 40.00    Types: Cigarettes    Last attempt to quit: 10/07/2019    Years since quitting: 1.6   Smokeless tobacco: Never   Tobacco comments:    per patient wears a patch and has cut down on cigarettes/day (10/05/2019)  Vaping Use   Vaping Use: Never used  Substance and Sexual Activity   Alcohol use: Yes    Alcohol/week: 0.0 standard drinks    Comment: 5-6 cocktails, 1 times a week   Drug use: No   Sexual activity: Not on file  Other Topics Concern   Not on file  Social History Narrative   From [place].  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   Social Determinants of Health   Financial Resource Strain: Low Risk    Difficulty of Paying Living Expenses: Not hard at all  Food Insecurity:  Not on file  Transportation Needs: No Transportation Needs   Lack of Transportation (Medical): No   Lack of Transportation (Non-Medical): No  Physical Activity: Insufficiently Active   Days of Exercise per Week: 2 days   Minutes of Exercise per Session: 20 min  Stress: No Stress Concern Present   Feeling of Stress : Not at all  Social Connections: Moderately Isolated   Frequency of Communication with Friends and Family: More than three times a week   Frequency of Social Gatherings with Friends and Family: More than three times a week   Attends Religious Services: Never   Marine scientist or Organizations: No   Attends Archivist Meetings: Never   Marital Status: Married    Tobacco Counseling Ready to quit: Not Answered Counseling given: Not Answered Tobacco comments: per patient wears a patch and has cut down on cigarettes/day (10/05/2019)   Clinical Intake:  Pre-visit preparation completed: Yes  Pain : 0-10 Pain Score: 7  Pain Type: Acute pain Pain Location: Back Pain Orientation: Lower Pain Relieving Factors: taking ibuprofen  Pain Relieving Factors: taking ibuprofen  Diabetes: Yes CBG done?: Yes (98) CBG resulted in Enter/ Edit results?: No Did pt. bring in CBG monitor from home?: No  How often do you need to have someone help you when you read instructions, pamphlets, or other written materials from your doctor or pharmacy?: 1 - Never What is the last grade level you completed in school?: 12  Diabetic?Yes  Interpreter Needed?: No  Information entered by :: Lacretia Nicks, [place]   Activities of Daily Living In your present state of health, do you have any difficulty performing the following activities: 06/14/2021 05/28/2021  Hearing? Tempie Donning  Vision? N N  Difficulty concentrating or making decisions? N N  Walking or climbing stairs? N N  Dressing or bathing? N N  Doing errands, shopping? N N  Preparing Food and eating ? N -  Using the Toilet? N  -  In the past six months,  have you accidently leaked urine? N -  Do you have problems with loss of bowel control? N -  Managing your Medications? N -  Managing your Finances? N -  Housekeeping or managing your Housekeeping? N -  Some recent data might be hidden    Patient Care Team: [name], MD as PCP - General (Internal Medicine) Minna Merritts, MD as Consulting Physician (Cardiology)  Indicate any recent Medical Services you may have received from other than [facility] providers in the past year (date may be approximate).     Assessment:   This is a routine wellness examination for Vishwa.  Hearing/Vision screen No results found.  Dietary issues and exercise activities discussed:     Goals Addressed   None    Depression Screen PHQ 2/9 Scores 06/14/2021 05/28/2021 04/27/2020 12/23/2019 11/21/2014  PHQ - 2 Score 1 3 0 0 0  PHQ- 9 Score 11 13 - - -    Fall Risk Fall Risk  06/14/2021 06/14/2021 05/28/2021 12/23/2019 02/24/2019  Falls in the past year? 0 0 0 0 (No Data)  Comment - - - - Emmi Telephone Survey: data to providers prior to load  Number falls in past yr: 0 0 0 0 (No Data)  Comment - - - - Emmi Telephone Survey Actual Response =   Injury with Fall? 0 0 0 0 -  Risk for fall due to : - No Fall Risks No Fall Risks - -  Follow up - Falls evaluation completed Falls evaluation completed Falls evaluation completed;Falls prevention discussed -    FALL RISK PREVENTION PERTAINING TO THE HOME:  Any stairs in or around the home? Yes  If so, are there any without handrails? No  Home free of loose throw rugs in walkways, pet beds, electrical cords, etc? Yes  Adequate lighting in your home to reduce risk of falls? Yes   ASSISTIVE DEVICES UTILIZED TO PREVENT FALLS:  Life alert? No  Use of a cane, walker or w/c? No  Grab bars in the bathroom? No  Shower chair or bench in shower? No  Elevated toilet seat or a handicapped toilet? No   TIMED UP AND GO:  Was the test  performed? No .  Length of time to ambulate- NA  Gait steady and fast without use of assistive device  Cognitive Function: MMSE - Mini Mental State Exam 06/14/2021  Not completed: Unable to complete     6CIT Screen 06/14/2021  What Year? 0 points  What month? 0 points  What time? 0 points  Count back from 20 0 points  Months in reverse 0 points  Repeat phrase 0 points  Total Score 0    Immunizations Immunization History  Administered Date(s) Administered   Influenza Whole 05/17/2008, 05/26/2013   Influenza,inj,Quad PF,6+ Mos 04/13/2014   Influenza-Unspecified 05/05/2021   PNEUMOCOCCAL CONJUGATE-20 05/05/2021   Pneumococcal Conjugate-13 11/21/2014   Pneumococcal Polysaccharide-23 07/06/2013   Td 09/13/2003   Tdap 04/13/2014    TDAP status: Up to date  Flu Vaccine status: Up to date  Pneumococcal vaccine status: Up to date  Covid-19 vaccine status: Information provided on how to obtain vaccines.   Qualifies for Shingles Vaccine? No   Zostavax completed No   Shingrix Completed?: No.    Education has been provided regarding the importance of this vaccine. Patient has been advised to call insurance company to determine out of pocket expense if they have not yet received this vaccine. Advised may also receive vaccine at local pharmacy  or Health Dept. Verbalized acceptance and understanding.  Screening Tests Health Maintenance  Topic Date Due   Zoster Vaccines- Shingrix (1 of 2) 09/14/2021 (Originally 07/26/1970)   COVID-19 Vaccine (1) 09/25/2021 (Originally 01/24/1952)   COLONOSCOPY (Pts 45-84yrs Insurance coverage will need to be confirmed)  05/28/2022 (Originally 04/20/2021)   HEMOGLOBIN A1C  09/05/2021   OPHTHALMOLOGY EXAM  10/26/2021   FOOT EXAM  02/11/2022   TETANUS/TDAP  04/13/2024   Pneumonia Vaccine 36+ Years old  Completed   INFLUENZA VACCINE  Completed   Hepatitis C Screening  Completed   HPV VACCINES  Aged Out    Health Maintenance  There are no  preventive care reminders to display for this patient.   Colorectal cancer screening: Type of screening: Colonoscopy. Completed 04-20-2018. Repeat every 10 years  Lung Cancer Screening: (Low Dose CT Chest recommended if Age 47-80 years, 30 pack-year currently smoking OR have quit w/in 15years.) does qualify.   Lung Cancer Screening Referral: ordered today   Additional Screening:  Hepatitis C Screening: does not qualify; Completed 06/13/2021  Vision Screening: Recommended annual ophthalmology exams for early detection of glaucoma and other disorders of the eye. Is the patient up to date with their annual eye exam?  Yes  Who is the provider or what is the name of the office in which the patient attends annual eye exams? [facility]  If pt is not established with a provider, would they like to be referred to a provider to establish care?  Already established  .   Dental Screening: Recommended annual dental exams for proper oral hygiene  Community Resource Referral / Chronic Care Management: CRR required this visit?  No   CCM required this visit?  No      Plan:     I have personally reviewed and noted the following in the patient's chart:   Medical and social history Use of alcohol, tobacco or illicit drugs  Current medications and supplements including opioid prescriptions. Patient is not currently taking opioid prescriptions. Functional ability and status Nutritional status Physical activity Advanced directives List of other physicians Hospitalizations, surgeries, and ER visits in previous 12 months Vitals Screenings to include cognitive, depression, and falls Referrals and appointments  In addition, I have reviewed and discussed with patient certain preventive protocols, quality metrics, and best practice recommendations. A written personalized care plan for preventive services as well as general preventive health recommendations were provided to patient.     Lacretia Nicks, [place]   06/14/2021   Nurse Notes:  Current Outpatient Medications on File Prior to Visit  Medication Sig Dispense Refill   ALPRAZolam (XANAX) 0.25 MG tablet Take 1 tablet (0.25 mg total) by mouth 2 (two) times daily as needed for anxiety. 30 tablet 0   atorvastatin (LIPITOR) 80 MG tablet Take 1 tablet (80 mg total) by mouth daily. 90 tablet 3   clopidogrel (PLAVIX) 75 MG tablet Take 1 tablet (75 mg total) by mouth daily. 90 tablet 3   fenofibrate 160 MG tablet TAKE 1 TABLET BY MOUTH DAILY 90 tablet 1   furosemide (LASIX) 40 MG tablet Take 1 tablet (40 mg total) by mouth 2 (two) times daily. 180 tablet 3   glimepiride (AMARYL) 2 MG tablet TAKE 1/2 TABLET BY MOUTH IN THE MORNING 45 tablet 7   glucose blood (TRUE METRIX BLOOD GLUCOSE TEST) test strip 1 each by Other route daily. Use as instructed 100 each 12   losartan (COZAAR) 50 MG tablet Take 1 tablet (50 mg total)  by mouth daily. 90 tablet 3   metFORMIN (GLUCOPHAGE) 1000 MG tablet TAKE 1 TABLET BY MOUTH TWICE A DAY 180 tablet 6   metoprolol tartrate (LOPRESSOR) 25 MG tablet Take 1 tablet (25 mg total) by mouth 2 (two) times daily. 180 tablet 3   nitroGLYCERIN (NITROSTAT) 0.4 MG SL tablet Place 0.4 mg under the tongue every 5 (five) minutes as needed for chest pain.     pantoprazole (PROTONIX) 40 MG tablet Take 1 tablet (40 mg total) by mouth daily before breakfast. 90 tablet 3   sildenafil (REVATIO) 20 MG tablet TAKE ONE TABLET BY MOUTH DAILY AS NEEDED 30 tablet 6   sildenafil (VIAGRA) 100 MG tablet Take 1 tablet (100 mg total) by mouth daily as needed for erectile dysfunction. 20 tablet 0   spironolactone (ALDACTONE) 25 MG tablet Take 1 tablet (25 mg total) by mouth daily. 30 tablet 3   traZODone (DESYREL) 50 MG tablet TAKE 1 TABLET BY MOUTH EVERY DAY 90 tablet 1   No current facility-administered medications on file prior to visit.   Time spent 40 min

## 2021-06-15 NOTE — Progress Notes (Signed)
I have reviewed this visit and agree with the documentation.   

## 2021-08-02 ENCOUNTER — Other Ambulatory Visit: Payer: Self-pay

## 2021-08-02 MED ORDER — AZITHROMYCIN 250 MG PO TABS: ORAL_TABLET | ORAL | 0 refills | Status: AC

## 2021-08-09 ENCOUNTER — Other Ambulatory Visit: Payer: Self-pay

## 2021-08-09 ENCOUNTER — Inpatient Hospital Stay
Admission: EM | Admit: 2021-08-09 | Discharge: 2021-08-13 | DRG: 177 | Discharge status: Against Medical Advice | Payer: Medicare Other | Attending: Internal Medicine | Admitting: Internal Medicine

## 2021-08-09 ENCOUNTER — Encounter: Payer: Self-pay | Admitting: Emergency Medicine

## 2021-08-09 ENCOUNTER — Inpatient Hospital Stay: Payer: Medicare Other

## 2021-08-09 ENCOUNTER — Emergency Department: Payer: Medicare Other

## 2021-08-09 DIAGNOSIS — J9621 Acute and chronic respiratory failure with hypoxia: Secondary | ICD-10-CM | POA: Diagnosis not present

## 2021-08-09 DIAGNOSIS — I5033 Acute on chronic diastolic (congestive) heart failure: Secondary | ICD-10-CM | POA: Diagnosis not present

## 2021-08-09 DIAGNOSIS — J441 Chronic obstructive pulmonary disease with (acute) exacerbation: Secondary | ICD-10-CM | POA: Diagnosis not present

## 2021-08-09 DIAGNOSIS — I480 Paroxysmal atrial fibrillation: Secondary | ICD-10-CM | POA: Diagnosis not present

## 2021-08-09 DIAGNOSIS — D509 Iron deficiency anemia, unspecified: Secondary | ICD-10-CM | POA: Diagnosis present

## 2021-08-09 DIAGNOSIS — J449 Chronic obstructive pulmonary disease, unspecified: Secondary | ICD-10-CM | POA: Diagnosis present

## 2021-08-09 DIAGNOSIS — Z833 Family history of diabetes mellitus: Secondary | ICD-10-CM

## 2021-08-09 DIAGNOSIS — Z7984 Long term (current) use of oral hypoglycemic drugs: Secondary | ICD-10-CM

## 2021-08-09 DIAGNOSIS — Z8249 Family history of ischemic heart disease and other diseases of the circulatory system: Secondary | ICD-10-CM

## 2021-08-09 DIAGNOSIS — I440 Atrioventricular block, first degree: Secondary | ICD-10-CM | POA: Diagnosis not present

## 2021-08-09 DIAGNOSIS — I5031 Acute diastolic (congestive) heart failure: Secondary | ICD-10-CM | POA: Diagnosis not present

## 2021-08-09 DIAGNOSIS — N179 Acute kidney failure, unspecified: Secondary | ICD-10-CM | POA: Diagnosis present

## 2021-08-09 DIAGNOSIS — I130 Hypertensive heart and chronic kidney disease with heart failure and stage 1 through stage 4 chronic kidney disease, or unspecified chronic kidney disease: Secondary | ICD-10-CM | POA: Diagnosis present

## 2021-08-09 DIAGNOSIS — Z9981 Dependence on supplemental oxygen: Secondary | ICD-10-CM | POA: Diagnosis not present

## 2021-08-09 DIAGNOSIS — I471 Supraventricular tachycardia: Secondary | ICD-10-CM | POA: Diagnosis present

## 2021-08-09 DIAGNOSIS — N184 Chronic kidney disease, stage 4 (severe): Secondary | ICD-10-CM | POA: Diagnosis present

## 2021-08-09 DIAGNOSIS — Z96653 Presence of artificial knee joint, bilateral: Secondary | ICD-10-CM | POA: Diagnosis present

## 2021-08-09 DIAGNOSIS — J9620 Acute and chronic respiratory failure, unspecified whether with hypoxia or hypercapnia: Secondary | ICD-10-CM | POA: Diagnosis not present

## 2021-08-09 DIAGNOSIS — J1282 Pneumonia due to coronavirus disease 2019: Secondary | ICD-10-CM | POA: Diagnosis not present

## 2021-08-09 DIAGNOSIS — R0602 Shortness of breath: Secondary | ICD-10-CM | POA: Diagnosis not present

## 2021-08-09 DIAGNOSIS — E669 Obesity, unspecified: Secondary | ICD-10-CM | POA: Diagnosis present

## 2021-08-09 DIAGNOSIS — I110 Hypertensive heart disease with heart failure: Secondary | ICD-10-CM | POA: Diagnosis not present

## 2021-08-09 DIAGNOSIS — I10 Essential (primary) hypertension: Secondary | ICD-10-CM | POA: Diagnosis not present

## 2021-08-09 DIAGNOSIS — E1122 Type 2 diabetes mellitus with diabetic chronic kidney disease: Secondary | ICD-10-CM | POA: Diagnosis present

## 2021-08-09 DIAGNOSIS — Z888 Allergy status to other drugs, medicaments and biological substances status: Secondary | ICD-10-CM

## 2021-08-09 DIAGNOSIS — I509 Heart failure, unspecified: Secondary | ICD-10-CM

## 2021-08-09 DIAGNOSIS — F17210 Nicotine dependence, cigarettes, uncomplicated: Secondary | ICD-10-CM | POA: Diagnosis not present

## 2021-08-09 DIAGNOSIS — J440 Chronic obstructive pulmonary disease with acute lower respiratory infection: Secondary | ICD-10-CM | POA: Diagnosis present

## 2021-08-09 DIAGNOSIS — E785 Hyperlipidemia, unspecified: Secondary | ICD-10-CM | POA: Diagnosis present

## 2021-08-09 DIAGNOSIS — N183 Chronic kidney disease, stage 3 unspecified: Secondary | ICD-10-CM

## 2021-08-09 DIAGNOSIS — E119 Type 2 diabetes mellitus without complications: Secondary | ICD-10-CM

## 2021-08-09 DIAGNOSIS — N1832 Chronic kidney disease, stage 3b: Secondary | ICD-10-CM | POA: Diagnosis present

## 2021-08-09 DIAGNOSIS — I252 Old myocardial infarction: Secondary | ICD-10-CM

## 2021-08-09 DIAGNOSIS — Z7902 Long term (current) use of antithrombotics/antiplatelets: Secondary | ICD-10-CM

## 2021-08-09 DIAGNOSIS — Z951 Presence of aortocoronary bypass graft: Secondary | ICD-10-CM | POA: Diagnosis not present

## 2021-08-09 DIAGNOSIS — Z79899 Other long term (current) drug therapy: Secondary | ICD-10-CM

## 2021-08-09 DIAGNOSIS — Z6836 Body mass index (BMI) 36.0-36.9, adult: Secondary | ICD-10-CM

## 2021-08-09 DIAGNOSIS — I2510 Atherosclerotic heart disease of native coronary artery without angina pectoris: Secondary | ICD-10-CM | POA: Diagnosis present

## 2021-08-09 DIAGNOSIS — U071 COVID-19: Secondary | ICD-10-CM | POA: Diagnosis not present

## 2021-08-09 DIAGNOSIS — R600 Localized edema: Secondary | ICD-10-CM | POA: Diagnosis not present

## 2021-08-09 DIAGNOSIS — F419 Anxiety disorder, unspecified: Secondary | ICD-10-CM | POA: Diagnosis present

## 2021-08-09 DIAGNOSIS — I700 Atherosclerosis of aorta: Secondary | ICD-10-CM | POA: Diagnosis not present

## 2021-08-09 DIAGNOSIS — R0902 Hypoxemia: Secondary | ICD-10-CM

## 2021-08-09 DIAGNOSIS — Z720 Tobacco use: Secondary | ICD-10-CM | POA: Diagnosis present

## 2021-08-09 LAB — TROPONIN I (HIGH SENSITIVITY)
Troponin I (High Sensitivity): 15 ng/L (ref <18)
Troponin I (High Sensitivity): 14 ng/L (ref <18)

## 2021-08-09 LAB — CBC
HCT: 36.7 % — ABNORMAL LOW (ref 39.0–52.0)
Hemoglobin: 10.7 g/dL — ABNORMAL LOW (ref 13.0–17.0)
MCH: 22.3 pg — ABNORMAL LOW (ref 26.0–34.0)
MCHC: 29.2 g/dL — ABNORMAL LOW (ref 30.0–36.0)
MCV: 76.6 fL — ABNORMAL LOW (ref 80.0–100.0)
Platelets: 373 10*3/uL (ref 150–400)
RBC: 4.79 MIL/uL (ref 4.22–5.81)
RDW: 18.3 % — ABNORMAL HIGH (ref 11.5–15.5)
WBC: 6.6 10*3/uL (ref 4.0–10.5)
nRBC: 3.5 % — ABNORMAL HIGH (ref 0.0–0.2)

## 2021-08-09 LAB — GLUCOSE, CAPILLARY: Glucose-Capillary: 286 mg/dL — ABNORMAL HIGH (ref 70–99)

## 2021-08-09 LAB — BASIC METABOLIC PANEL
Anion gap: 8 (ref 5–15)
BUN: 41 mg/dL — ABNORMAL HIGH (ref 8–23)
CO2: 31 mmol/L (ref 22–32)
Calcium: 8.9 mg/dL (ref 8.9–10.3)
Chloride: 98 mmol/L (ref 98–111)
Creatinine, Ser: 2.28 mg/dL — ABNORMAL HIGH (ref 0.61–1.24)
GFR, Estimated: 30 mL/min — ABNORMAL LOW (ref >60)
Glucose, Bld: 171 mg/dL — ABNORMAL HIGH (ref 70–99)
Potassium: 4.8 mmol/L (ref 3.5–5.1)
Sodium: 137 mmol/L (ref 135–145)

## 2021-08-09 LAB — RESP PANEL BY RT-PCR (FLU A&B, COVID) ARPGX2
Influenza A by PCR: NEGATIVE
Influenza B by PCR: NEGATIVE
SARS Coronavirus 2 by RT PCR: POSITIVE — AB

## 2021-08-09 LAB — BRAIN NATRIURETIC PEPTIDE: B Natriuretic Peptide: 428.4 pg/mL — ABNORMAL HIGH (ref 0.0–100.0)

## 2021-08-09 LAB — IRON AND TIBC
Iron: 28 ug/dL — ABNORMAL LOW (ref 45–182)
Saturation Ratios: 5 % — ABNORMAL LOW (ref 17.9–39.5)
TIBC: 559 ug/dL — ABNORMAL HIGH (ref 250–450)
UIBC: 531 ug/dL

## 2021-08-09 LAB — PROCALCITONIN: Procalcitonin: <0.1 ng/mL

## 2021-08-09 MED ORDER — SODIUM CHLORIDE 0.9 % IV SOLN
200.0000 mg | Freq: Once | INTRAVENOUS | Status: AC
  Administered 2021-08-09: 200 mg via INTRAVENOUS
  Filled 2021-08-09: qty 200

## 2021-08-09 MED ORDER — LOSARTAN POTASSIUM 50 MG PO TABS
50.0000 mg | ORAL_TABLET | Freq: Every day | ORAL | Status: DC
  Filled 2021-08-09: qty 1

## 2021-08-09 MED ORDER — ENOXAPARIN SODIUM 60 MG/0.6ML IJ SOSY
0.5000 mg/kg | PREFILLED_SYRINGE | INTRAMUSCULAR | Status: DC
  Administered 2021-08-10 – 2021-08-12 (×3): 57.5 mg via SUBCUTANEOUS
  Filled 2021-08-09 (×4): qty 0.6

## 2021-08-09 MED ORDER — ONDANSETRON HCL 4 MG/2ML IJ SOLN: 4.0000 mg | Freq: Four times a day (QID) | INTRAMUSCULAR | Status: DC | PRN

## 2021-08-09 MED ORDER — ALPRAZOLAM 0.25 MG PO TABS
0.2500 mg | ORAL_TABLET | Freq: Two times a day (BID) | ORAL | Status: DC | PRN
  Administered 2021-08-12: 0.25 mg via ORAL
  Filled 2021-08-09 (×2): qty 1

## 2021-08-09 MED ORDER — ATORVASTATIN CALCIUM 20 MG PO TABS
80.0000 mg | ORAL_TABLET | Freq: Every day | ORAL | Status: DC
  Administered 2021-08-10 – 2021-08-13 (×4): 80 mg via ORAL
  Filled 2021-08-09 (×4): qty 4

## 2021-08-09 MED ORDER — NITROGLYCERIN 0.4 MG SL SUBL: 0.4000 mg | SUBLINGUAL_TABLET | SUBLINGUAL | Status: DC | PRN

## 2021-08-09 MED ORDER — PREDNISONE 20 MG PO TABS: 50.0000 mg | ORAL_TABLET | Freq: Every day | ORAL | Status: DC

## 2021-08-09 MED ORDER — CLOPIDOGREL BISULFATE 75 MG PO TABS
75.0000 mg | ORAL_TABLET | Freq: Every day | ORAL | Status: DC
  Administered 2021-08-09 – 2021-08-13 (×5): 75 mg via ORAL
  Filled 2021-08-09 (×5): qty 1

## 2021-08-09 MED ORDER — PANTOPRAZOLE SODIUM 40 MG PO TBEC
40.0000 mg | DELAYED_RELEASE_TABLET | Freq: Every day | ORAL | Status: DC
  Administered 2021-08-10 – 2021-08-13 (×4): 40 mg via ORAL
  Filled 2021-08-09 (×4): qty 1

## 2021-08-09 MED ORDER — NICOTINE 14 MG/24HR TD PT24
14.0000 mg | MEDICATED_PATCH | Freq: Every day | TRANSDERMAL | Status: DC
  Administered 2021-08-10 – 2021-08-12 (×3): 14 mg via TRANSDERMAL
  Filled 2021-08-09 (×5): qty 1

## 2021-08-09 MED ORDER — METHYLPREDNISOLONE SODIUM SUCC 125 MG IJ SOLR
60.0000 mg | Freq: Two times a day (BID) | INTRAMUSCULAR | Status: DC
  Administered 2021-08-10: 60 mg via INTRAVENOUS
  Filled 2021-08-09: qty 2

## 2021-08-09 MED ORDER — INSULIN DETEMIR 100 UNIT/ML ~~LOC~~ SOLN
0.1500 [IU]/kg | Freq: Two times a day (BID) | SUBCUTANEOUS | Status: DC
  Administered 2021-08-09: 17 [IU] via SUBCUTANEOUS
  Filled 2021-08-09 (×4): qty 0.17

## 2021-08-09 MED ORDER — FUROSEMIDE 40 MG PO TABS
40.0000 mg | ORAL_TABLET | Freq: Two times a day (BID) | ORAL | Status: DC
  Filled 2021-08-09: qty 1

## 2021-08-09 MED ORDER — ZINC SULFATE 220 (50 ZN) MG PO CAPS
220.0000 mg | ORAL_CAPSULE | Freq: Every day | ORAL | Status: DC
  Administered 2021-08-09 – 2021-08-13 (×5): 220 mg via ORAL
  Filled 2021-08-09 (×5): qty 1

## 2021-08-09 MED ORDER — SODIUM CHLORIDE 0.9 % IV SOLN
100.0000 mg | Freq: Every day | INTRAVENOUS | Status: AC
  Administered 2021-08-10 – 2021-08-13 (×4): 100 mg via INTRAVENOUS
  Filled 2021-08-09: qty 20
  Filled 2021-08-09: qty 100
  Filled 2021-08-09 (×3): qty 20

## 2021-08-09 MED ORDER — SPIRONOLACTONE 25 MG PO TABS
25.0000 mg | ORAL_TABLET | Freq: Every day | ORAL | Status: DC
  Filled 2021-08-09: qty 1

## 2021-08-09 MED ORDER — ONDANSETRON HCL 4 MG PO TABS: 4.0000 mg | ORAL_TABLET | Freq: Four times a day (QID) | ORAL | Status: DC | PRN

## 2021-08-09 MED ORDER — HYDROCOD POLST-CPM POLST ER 10-8 MG/5ML PO SUER
5.0000 mL | Freq: Two times a day (BID) | ORAL | Status: DC | PRN
  Administered 2021-08-10: 5 mL via ORAL
  Filled 2021-08-09 (×2): qty 5

## 2021-08-09 MED ORDER — ACETAMINOPHEN 325 MG PO TABS: 650.0000 mg | ORAL_TABLET | Freq: Four times a day (QID) | ORAL | Status: DC | PRN

## 2021-08-09 MED ORDER — INSULIN ASPART 100 UNIT/ML IJ SOLN
0.0000 [IU] | Freq: Three times a day (TID) | INTRAMUSCULAR | Status: DC
  Administered 2021-08-10 (×2): 7 [IU] via SUBCUTANEOUS
  Administered 2021-08-10: 11 [IU] via SUBCUTANEOUS
  Administered 2021-08-11: 4 [IU] via SUBCUTANEOUS
  Administered 2021-08-11: 7 [IU] via SUBCUTANEOUS
  Administered 2021-08-11: 4 [IU] via SUBCUTANEOUS
  Administered 2021-08-12: 11 [IU] via SUBCUTANEOUS
  Administered 2021-08-13: 7 [IU] via SUBCUTANEOUS
  Administered 2021-08-13: 3 [IU] via SUBCUTANEOUS
  Filled 2021-08-09 (×9): qty 1

## 2021-08-09 MED ORDER — METHYLPREDNISOLONE SODIUM SUCC 125 MG IJ SOLR
125.0000 mg | Freq: Once | INTRAMUSCULAR | Status: AC
  Administered 2021-08-09: 125 mg via INTRAVENOUS
  Filled 2021-08-09: qty 2

## 2021-08-09 MED ORDER — ASCORBIC ACID 500 MG PO TABS
500.0000 mg | ORAL_TABLET | Freq: Every day | ORAL | Status: DC
  Administered 2021-08-09 – 2021-08-13 (×5): 500 mg via ORAL
  Filled 2021-08-09 (×5): qty 1

## 2021-08-09 MED ORDER — FUROSEMIDE 10 MG/ML IJ SOLN
60.0000 mg | Freq: Once | INTRAMUSCULAR | Status: AC
Start: 2021-08-09 — End: 2021-08-09
  Administered 2021-08-09: 60 mg via INTRAVENOUS
  Filled 2021-08-09: qty 8

## 2021-08-09 MED ORDER — FENOFIBRATE 160 MG PO TABS
160.0000 mg | ORAL_TABLET | Freq: Every day | ORAL | Status: DC
  Administered 2021-08-10 – 2021-08-13 (×4): 160 mg via ORAL
  Filled 2021-08-09 (×4): qty 1

## 2021-08-09 MED ORDER — METOPROLOL TARTRATE 25 MG PO TABS
25.0000 mg | ORAL_TABLET | Freq: Two times a day (BID) | ORAL | Status: DC
  Administered 2021-08-09 – 2021-08-13 (×7): 25 mg via ORAL
  Filled 2021-08-09 (×8): qty 1

## 2021-08-09 MED ORDER — IPRATROPIUM-ALBUTEROL 0.5-2.5 (3) MG/3ML IN SOLN
6.0000 mL | Freq: Once | RESPIRATORY_TRACT | Status: AC
  Administered 2021-08-09: 6 mL via RESPIRATORY_TRACT
  Filled 2021-08-09: qty 6

## 2021-08-09 MED ORDER — ALBUTEROL SULFATE HFA 108 (90 BASE) MCG/ACT IN AERS
2.0000 | INHALATION_SPRAY | Freq: Four times a day (QID) | RESPIRATORY_TRACT | Status: DC
  Administered 2021-08-09 – 2021-08-13 (×13): 2 via RESPIRATORY_TRACT
  Filled 2021-08-09 (×2): qty 6.7

## 2021-08-09 NOTE — ED Provider Notes (Signed)
[facility] Inpatient Provider Note    Event Date/Time   First MD Initiated Contact with Patient 08/09/21 1356     (approximate)   History   No chief complaint on file.   HPI  Joshua Roy is a 71 y.o. male who presents to the ED for evaluation of No chief complaint on file.   I review outpatient PCP visit from 11/16.  History of COPD, CKD, CAD s/p CABG x4 in 2021, DM, HTN, HLD and paroxysmal A. fib.  DAPT with Plavix.  Reports wearing nighttime O2 only, none during the day.  Patient presents to the ED from home for evaluation of about 1 week of worsening shortness of breath.  He reports cough with no significant increased sputum production from his baseline.  Denies any fevers, chest pain, syncope, falls, abdominal pain, emesis or diarrhea.  Reports his lower extremities are swollen, but he has been compliant with his diuretic.   Physical Exam   Triage Vital Signs: ED Triage Vitals  Enc Vitals Group     BP 08/09/21 1258 140/61     Pulse Rate 08/09/21 1301 74     Resp 08/09/21 1258 18     Temp 08/09/21 1301 98.6 F (37 C)     Temp Source 08/09/21 1258 Oral     SpO2 08/09/21 1258 (!) 74 %     Weight 08/09/21 1258 251 lb 5.2 oz (114 kg)     Height 08/09/21 1258 5\' 10"  (1.778 m)     Head Circumference --      Peak Flow --      Pain Score 08/09/21 1258 0     Pain Loc --      Pain Edu? --      Excl. in [place]? --     Most recent vital signs: Vitals:   08/09/21 1351 08/09/21 1430  BP: (!) 148/70 128/65  Pulse: 75 71  Resp: (!) 30 (!) 30  Temp:    SpO2: 95% 92%    General: Awake, no distress.  Sitting up in bed, obviously tachypneic and speaking in phrases only. CV:  Good peripheral perfusion.  RRR Resp:  Normal effort.  Tachypneic to about 30.  Diffuse wheezing and bibasilar crackles are present.  Decreased airflow throughout. Abd:  No distention.  Nontender throughout. MSK:  No deformity noted.  Asymmetric pitting edema to the bilateral lower  extremities, left greater than right.  No overlying skin changes, erythema or signs of trauma. Neuro:  No focal deficits appreciated. Other:     ED Results / Procedures / Treatments   Labs (all labs ordered are listed, but only abnormal results are displayed) Labs Reviewed  BASIC METABOLIC PANEL - Abnormal; Notable for the following components:      Result Value   Glucose, Bld 171 (*)    BUN 41 (*)    Creatinine, Ser 2.28 (*)    GFR, Estimated 30 (*)    All other components within normal limits  CBC - Abnormal; Notable for the following components:   Hemoglobin 10.7 (*)    HCT 36.7 (*)    MCV 76.6 (*)    MCH 22.3 (*)    MCHC 29.2 (*)    RDW 18.3 (*)    nRBC 3.5 (*)    All other components within normal limits  BRAIN NATRIURETIC PEPTIDE - Abnormal; Notable for the following components:   B Natriuretic Peptide 428.4 (*)    All other components within normal limits  RESP PANEL BY RT-PCR (FLU A&B, COVID) ARPGX2  PROCALCITONIN  TROPONIN I (HIGH SENSITIVITY)  TROPONIN I (HIGH SENSITIVITY)    EKG  \Sinus rhythm, rate of 75 bpm.  Rightward axis and normal intervals.  Inferior T wave inversions and nonspecific ST changes laterally without STEMI criteria.  RADIOLOGY 2 view CXR reviewed by me with bibasilar interstitial prominences.  Official radiology report(s): DG Chest 2 View  Result Date: 08/09/2021 CLINICAL DATA:  Shortness of breath. Tested positive for COVID last Thursday. EXAM: CHEST - 2 VIEW COMPARISON:  None. FINDINGS: The heart size and mediastinal contours are within normal limits. Prominence of the bronchial markings in bilateral lower lobes concerning for viral bronchiolitis or bronchopneumonia. No focal consolidation or large pleural effusion. The visualized skeletal structures are unremarkable. IMPRESSION: Prominence of the bronchial markings in bilateral lower lobes concerning for bronchiolitis or bronchopneumonia. No focal consolidation or large pleural effusion.  Electronically Signed   By: Keane Police D.O.   On: 08/09/2021 13:31    PROCEDURES and INTERVENTIONS:  .1-3 Lead EKG Interpretation Performed by: Vladimir Crofts, MD Authorized by: Vladimir Crofts, MD     Interpretation: normal     ECG rate:  76   ECG rate assessment: normal     Rhythm: sinus rhythm     Ectopy: none     Conduction: normal   .Critical Care Performed by: Vladimir Crofts, MD Authorized by: Vladimir Crofts, MD   Critical care provider statement:    Critical care time (minutes):  30   Critical care time was exclusive of:  Separately billable procedures and treating other patients   Critical care was necessary to treat or prevent imminent or life-threatening deterioration of the following conditions:  Respiratory failure   Critical care was time spent personally by me on the following activities:  Development of treatment plan with patient or surrogate, discussions with consultants, evaluation of patient's response to treatment, examination of patient, ordering and review of laboratory studies, ordering and review of radiographic studies, ordering and performing treatments and interventions, pulse oximetry, re-evaluation of patient's condition and review of old charts  Medications  ipratropium-albuterol (DUONEB) 0.5-2.5 (3) MG/3ML nebulizer solution 6 mL (has no administration in time range)  methylPREDNISolone sodium succinate (SOLU-MEDROL) 125 mg/2 mL injection 125 mg (has no administration in time range)  furosemide (LASIX) injection 60 mg (has no administration in time range)     IMPRESSION / MDM / ASSESSMENT AND PLAN / ED COURSE  I reviewed the triage vital signs and the nursing notes.  71 year old male with extensive cardiopulmonary history presents to the ED with shortness of breath, with evidence of COPD and CHF exacerbations requiring medical admission.   He is hypoxic to the 70s on room air requiring 4 L nasal cannula.  Remains hemodynamically stable.  On exam, he does  appear volume overload as well as wheezing with signs of both CHF and COPD exacerbations.  No distress, I considered BiPAP, but do not think it is necessary at this time.  Blood work shows CKD around baseline as well as macrocytic anemia around baseline.  Troponin is low and his EKG does not show any acutely ischemic features, only nonspecific changes.  His BNP is elevated, further suggestive of volume overload and CHF.  DVT/PE is a possibility but his renal dysfunction precludes CTA.  We will start with venous ultrasound to his bilateral lower extremities and initiate treatment for COPD and CHF exacerbations.  He will require medical admission.  We will consult with hospitalist  to facilitate this.      FINAL CLINICAL IMPRESSION(S) / ED DIAGNOSES   Final diagnoses:  Shortness of breath  Hypoxia  COPD exacerbation (HCC)  Acute on chronic congestive heart failure, unspecified heart failure type ([place])     Rx / DC Orders   ED Discharge Orders     None        Note:  This document was prepared using Dragon voice recognition software and may include unintentional dictation errors.   Vladimir Crofts, MD 08/09/21 [phone]

## 2021-08-09 NOTE — H&P (Addendum)
**Note Joshua-Identified via Obfuscation** History and Physical    [name]:[id number] DOB: October 25, 1950 DOA: 08/09/2021  PCP: [name], MD   Patient coming from: Home  I have personally briefly reviewed patient's old medical records in [facility]  Chief Complaint: Shortness of breath  HPI: Joshua Roy is a 71 y.o. male with medical history significant for coronary artery disease status post four-vessel CABG, carotid artery disease status post carotid endarterectomy, history of A. fib, paroxysmal SVT, hypertension, diabetes mellitus with complications of stage III chronic kidney disease, COPD with chronic respiratory failure on 2 L of oxygen at night who presents to the ER for evaluation of worsening shortness of breath over the last 1 week. Patient states that symptoms started about a week ago and he had a positive home COVID test.  Wife also tested positive as well.  Symptoms include shortness of breath both at rest and with exertion associated with a cough productive of clear phlegm, weakness, increased fatigue and poor oral intake.  He also has bilateral lower extremity swelling. He denies having any fever or chills, no abdominal pain, no nausea, no vomiting, no myalgias, no headache, no blurred vision deficit. Sodium 137, potassium 4.8, chloride 98, bicarb 31, glucose 171, BUN 41, creatinine 2.28, calcium 8.9, BNP 428, troponin 15, white count 6.6, hemoglobin 10.7, hematocrit 36.7, MCV 76.6, RDW 18.3, platelet count 373 COVID-19 PCR is positive Chest x-ray reviewed by me shows increased bronchial markings in bilateral lower lobes concerning for bronchiolitis or bronchopneumonia. Lower extremity venous Doppler pending Twelve-lead EKG reviewed by me shows normal sinus rhythm, low voltage QRS, T wave abnormality in inferior leads and right axis deviation.   ED Course: Patient is a 71 year old male who presents to the ER for evaluation of worsening shortness of breath for over a week.  He tested  positive for the COVID-19 virus about a week ago. Chest x-ray shows findings concerning for bronchopneumonia. Patient noted to have room air pulse oximetry of 74% upon arrival to the ER and is currently on 4 L of oxygen. He received a dose of IV methylprednisolone as well as 2 DuoNeb and Lasix 60 mg IV x 1 He will be admitted to the hospital for further evaluation.    Review of Systems: As per HPI otherwise all other systems reviewed and negative.    Past Medical History:  Diagnosis Date   (HFpEF) heart failure with preserved ejection fraction ([place])    a. 07/2019 Echo: EF 60-65%. Mod LVH. Gr1 DD. No rwma. Nl RV size/fxn. Mildly dil LA; b. 11/2019 Echo: EF 55-60%, no rwma, mild LVH. Nl RV size/fxn. Mod dil LA.   ACE-inhibitor cough    Anxiety    Bronchitis 06/27/2016   Carotid arterial disease ([place])    a. 09/2019 Carotid U/S: RICA 84-16%, LICA 6-06%; b. 09/158 R CEA.   CKD (chronic kidney disease), stage III (HCC)    COPD (chronic obstructive pulmonary disease) (HCC)    cath, mild inf. hypokinesis EF 49%, 100% ROA   Coronary artery disease    a. 2001 Cath: LM 10, LAD 60p/m, D1 30, LCX 20p, RCA 16m/d-->Med Rx; b. 05/2017 Ex Mv: Ex time 5:46, antlat twi @ rest, fixed infarct w/ mild peri-infarct ischemia. EF 38%; c. 08/2019 Cath: LM 40ost, 70d, LAD 21m, LCX 8m, RCA 80-90p, 144m; d. 09/2019 CABG x4: LIMA->LAD, VG->RI, VG->OM, VG->RPDA.   Diabetes mellitus type II    Hyperlipidemia    Hypertension    MI (myocardial infarction) [facility]) age 51  OA (osteoarthritis)    knee OA, injected 2012 by ortho   PAF (paroxysmal atrial fibrillation) ([place])    a. 09/2019 post-op CABG-->[facility].   Pneumonia    PSVT (paroxysmal supraventricular tachycardia) ([place])    a. 08/2019 Zio: 189 brief runs of SVT (fastest 174 x 6 beats, longest 15 beats @ 107).   Tobacco abuse     Past Surgical History:  Procedure Laterality Date   CARDIAC CATHETERIZATION  05/06/2000   @ Joshua Roy   COLONOSCOPY WITH PROPOFOL N/A  04/20/2018   Procedure: COLONOSCOPY WITH PROPOFOL;  Surgeon: [name], MD;  Location[facility] ENDOSCOPY;  Service: Endoscopy;  Laterality: N/A;   CORONARY ARTERY BYPASS GRAFT N/A 10/07/2019   Procedure: CORONARY ARTERY BYPASS GRAFTING (CABG) using LIMA to LAD; Endoscopic harvesting of left greater saphenous vein: SVG to RAMUS; SVG to OM1; SVG to PDA.;  Surgeon: Gaye Pollack, MD;  Location: [facility] OR;  Service: Open Heart Surgery;  Laterality: N/A;   ENDARTERECTOMY Right 10/07/2019   Procedure: ENDARTERECTOMY CAROTID;  Surgeon: Rosetta Posner, MD;  Location[facility] OR;  Service: Vascular;  Laterality: Right;   ENDOVEIN HARVEST OF GREATER SAPHENOUS VEIN Left 10/07/2019   Procedure: [place] [facility] Greater Saphenous Vein;  Surgeon: Gaye Pollack, MD;  Location: [facility] OR;  Service: Open Heart Surgery;  Laterality: Left;   ESOPHAGOGASTRODUODENOSCOPY ENDOSCOPY     KNEE ARTHROSCOPY  07/25/04   left   [place]   right, open surgery- repair   RIGHT/LEFT HEART CATH AND CORONARY ANGIOGRAPHY Bilateral 09/08/2019   Procedure: RIGHT/LEFT HEART CATH AND CORONARY ANGIOGRAPHY;  Surgeon: Minna Merritts, MD;  Location: [facility];  Service: Cardiovascular;  Laterality: Bilateral;   TEE WITHOUT CARDIOVERSION N/A 10/07/2019   Procedure: TRANSESOPHAGEAL ECHOCARDIOGRAM (TEE);  Surgeon: Gaye Pollack, MD;  Location: [facility];  Service: Open Heart Surgery;  Laterality: N/A;   TOTAL KNEE ARTHROPLASTY Left 07/14/2016   Procedure: LEFT TOTAL KNEE ARTHROPLASTY;  Surgeon: Gaynelle Arabian, MD;  Location: [facility] ORS;  Service: Orthopedics;  Laterality: Left;   TOTAL KNEE ARTHROPLASTY Right 07/13/2017   Procedure: RIGHT TOTAL KNEE ARTHROPLASTY;  Surgeon: Gaynelle Arabian, MD;  Location: [facility] ORS;  Service: Orthopedics;  Laterality: Right;     reports that he has been smoking cigarettes. He has a 40.00 pack-year smoking history. He has never used smokeless tobacco. He reports current alcohol use. He reports that he does  not use drugs.  Allergies  Allergen Reactions   Ramipril Cough and Other (See Comments)    REACTION: Cough   Pseudoephedrine-Guaifenesin Er Other (See Comments) and Hypertension    Elevated BP, insomnia Elevated BP, insomnia    Family History  Problem Relation Age of Onset   Heart disease Father        CAD, MI x 3   Hypertension Father    Alcohol abuse Father    Diabetes Father    Diabetes Sister    Cancer Brother        lymphoma, in remission   Colon cancer Brother    Heart disease Sister        CABG x 3   Prostate cancer Neg Hx       Prior to Admission medications   Medication Sig Start Date End Date Taking? Authorizing Provider  atorvastatin (LIPITOR) 80 MG tablet Take 1 tablet (80 mg total) by mouth daily. 03/25/21  Yes Minna Merritts, MD  clopidogrel (PLAVIX) 75 MG tablet Take 1 tablet (75 mg total) by  mouth daily. 03/25/21  Yes Gollan, Kathlene November, MD  fenofibrate 160 MG tablet TAKE 1 TABLET BY MOUTH DAILY Patient taking differently: Take 160 mg by mouth daily. 03/29/21  Yes Masoud, Viann Shove, MD  furosemide (LASIX) 40 MG tablet Take 1 tablet (40 mg total) by mouth 2 (two) times daily. 03/25/21  Yes Gollan, Kathlene November, MD  glimepiride (AMARYL) 2 MG tablet TAKE 1/2 TABLET BY MOUTH IN THE MORNING Patient taking differently: Take 1 mg by mouth daily with breakfast. 03/11/21  Yes Masoud, Viann Shove, MD  losartan (COZAAR) 50 MG tablet Take 1 tablet (50 mg total) by mouth daily. 03/25/21  Yes Gollan, Kathlene November, MD  metFORMIN (GLUCOPHAGE) 1000 MG tablet TAKE 1 TABLET BY MOUTH TWICE A DAY Patient taking differently: Take 1,000 mg by mouth 2 (two) times daily with a meal. 10/08/20  Yes Masoud, Viann Shove, MD  metoprolol tartrate (LOPRESSOR) 25 MG tablet Take 1 tablet (25 mg total) by mouth 2 (two) times daily. 03/25/21  Yes Minna Merritts, MD  pantoprazole (PROTONIX) 40 MG tablet Take 1 tablet (40 mg total) by mouth daily before breakfast. 09/19/20  Yes Masoud, Viann Shove, MD  spironolactone (ALDACTONE)  25 MG tablet Take 1 tablet (25 mg total) by mouth daily. 03/25/21 08/09/21 Yes Gollan, Kathlene November, MD  ALPRAZolam Duanne Moron) 0.25 MG tablet Take 1 tablet (0.25 mg total) by mouth 2 (two) times daily as needed for anxiety. 05/28/21   [name], MD  glucose blood (TRUE METRIX BLOOD GLUCOSE TEST) test strip 1 each by Other route daily. Use as instructed 01/24/20   [name], MD  nitroGLYCERIN (NITROSTAT) 0.4 MG SL tablet Place 0.4 mg under the tongue every 5 (five) minutes as needed for chest pain.    [provider]  sildenafil (REVATIO) 20 MG tablet TAKE ONE TABLET BY MOUTH DAILY AS NEEDED 12/28/20   Beckie Salts, FNP  sildenafil (VIAGRA) 100 MG tablet Take 1 tablet (100 mg total) by mouth daily as needed for erectile dysfunction. 06/18/20   [name], MD  traZODone (DESYREL) 50 MG tablet TAKE 1 TABLET BY MOUTH EVERY DAY Patient not taking: Reported on 08/09/2021 03/11/21   [name], MD    Physical Exam: Vitals:   08/09/21 1513 08/09/21 1530 08/09/21 1753 08/09/21 1814  BP:  127/62 (!) 96/58 (!) 108/50  Pulse:  69 64 (!) 59  Resp:  (!) 33 18 18  Temp: 98.2 F (36.8 C)     TempSrc: Oral     SpO2:  94% 92%   Weight:      Height:         Vitals:   08/09/21 1513 08/09/21 1530 08/09/21 1753 08/09/21 1814  BP:  127/62 (!) 96/58 (!) 108/50  Pulse:  69 64 (!) 59  Resp:  (!) 33 18 18  Temp: 98.2 F (36.8 C)     TempSrc: Oral     SpO2:  94% 92%   Weight:      Height:          Constitutional: Alert and oriented x 3 .  Acutely ill-appearing HEENT:      Head: Normocephalic and atraumatic.         Eyes: PERLA, EOMI, Conjunctivae are normal. Sclera is non-icteric.       Mouth/Throat: Mucous membranes are moist.       Neck: Supple with no signs of meningismus. Cardiovascular: Regular rate and rhythm. No murmurs, gallops, or rubs. 2+ symmetrical distal pulses are present . No JVD. 2 +  LE edema Respiratory: Tachypneic.faint crackles in both lung fields bilaterally   Gastrointestinal: Soft, non tender, and non distended with positive bowel sounds.  Central adiposity Genitourinary: No CVA tenderness. Musculoskeletal: Nontender with normal range of motion in all extremities. No cyanosis, or erythema of extremities. Neurologic:  Face is symmetric. Moving all extremities. No gross focal neurologic deficits . Skin: Skin is warm, dry.  No rash or ulcers. Psychiatric: Mood and affect are normal    Labs on Admission: I have personally reviewed following labs and imaging studies  CBC: Recent Labs  Lab 08/09/21 1302  WBC 6.6  HGB 10.7*  HCT 36.7*  MCV 76.6*  PLT 562   Basic Metabolic Panel: Recent Labs  Lab 08/09/21 1302  NA 137  K 4.8  CL 98  CO2 31  GLUCOSE 171*  BUN 41*  CREATININE 2.28*  CALCIUM 8.9   GFR: Estimated Creatinine Clearance: 37 mL/min (A) (by C-G formula based on SCr of 2.28 mg/dL (H)). Liver Function Tests: No results for input(s): AST, ALT, ALKPHOS, BILITOT, PROT, ALBUMIN in the last 168 hours. No results for input(s): LIPASE, AMYLASE in the last 168 hours. No results for input(s): AMMONIA in the last 168 hours. Coagulation Profile: No results for input(s): INR, PROTIME in the last 168 hours. Cardiac Enzymes: No results for input(s): CKTOTAL, CKMB, CKMBINDEX, TROPONINI in the last 168 hours. BNP (last 3 results) No results for input(s): PROBNP in the last 8760 hours. HbA1C: No results for input(s): HGBA1C in the last 72 hours. CBG: No results for input(s): GLUCAP in the last 168 hours. Lipid Profile: No results for input(s): CHOL, HDL, LDLCALC, TRIG, CHOLHDL, LDLDIRECT in the last 72 hours. Thyroid Function Tests: No results for input(s): TSH, T4TOTAL, FREET4, T3FREE, THYROIDAB in the last 72 hours. Anemia Panel: No results for input(s): VITAMINB12, FOLATE, FERRITIN, TIBC, IRON, RETICCTPCT in the last 72 hours. Urine analysis:    Component Value Date/Time   COLORURINE STRAW (A) 10/05/2019 0957   APPEARANCEUR  CLEAR 10/05/2019 0957   LABSPEC 1.005 10/05/2019 0957   PHURINE 5.0 10/05/2019 0957   GLUCOSEU NEGATIVE 10/05/2019 0957   HGBUR NEGATIVE 10/05/2019 0957   BILIRUBINUR neg 05/28/2021 1202   KETONESUR NEGATIVE 10/05/2019 0957   PROTEINUR Negative 05/28/2021 1202   PROTEINUR NEGATIVE 10/05/2019 0957   UROBILINOGEN 1.0 05/28/2021 1202   NITRITE neg 05/28/2021 1202   NITRITE NEGATIVE 10/05/2019 0957   LEUKOCYTESUR Large (3+) (A) 05/28/2021 1202   LEUKOCYTESUR NEGATIVE 10/05/2019 0957    Radiological Exams on Admission: DG Chest 2 View  Result Date: 08/09/2021 CLINICAL DATA:  Shortness of breath. Tested positive for COVID last Thursday. EXAM: CHEST - 2 VIEW COMPARISON:  None. FINDINGS: The heart size and mediastinal contours are within normal limits. Prominence of the bronchial markings in bilateral lower lobes concerning for viral bronchiolitis or bronchopneumonia. No focal consolidation or large pleural effusion. The visualized skeletal structures are unremarkable. IMPRESSION: Prominence of the bronchial markings in bilateral lower lobes concerning for bronchiolitis or bronchopneumonia. No focal consolidation or large pleural effusion. Electronically Signed   By: Keane Police D.O.   On: 08/09/2021 13:31   CT CHEST WO CONTRAST  Result Date: 08/09/2021 CLINICAL DATA:  COVID positive, short of breath, weakness EXAM: CT CHEST WITHOUT CONTRAST TECHNIQUE: Multidetector CT imaging of the chest was performed following the standard protocol without IV contrast. RADIATION DOSE REDUCTION: This exam was performed according to the departmental dose-optimization program which includes automated exposure control, adjustment of the mA and/or kV according to  patient size and/or use of iterative reconstruction technique. COMPARISON:  08/09/2021, 08/09/2019 FINDINGS: Cardiovascular: Unenhanced imaging of the heart and great vessels demonstrates postsurgical changes from previous CABG. No pericardial effusion. The  heart is not enlarged. Normal caliber of the thoracic aorta, with diffuse atherosclerosis again identified. Evaluation of the vascular lumen is limited without IV contrast. Mediastinum/Nodes: Mediastinal and hilar adenopathy, with largest lymph node measuring 14 mm in the right paratracheal region reference image 22/2. Numerous other subcentimeter lymph nodes throughout the mediastinum and hilar regions are likely reactive. Thyroid, trachea, and esophagus are grossly unremarkable. Lungs/Pleura: Mild bilateral bronchial wall thickening. There are minimal scattered ground-glass opacities greatest in the mid and lower lung zones involving the right middle lobe, lingula, and left lower lobe. Findings are consistent with given history of COVID 19. Chronic areas of subpleural scarring are seen at the lung bases. No effusion or pneumothorax. The central airways are patent. Upper Abdomen: No acute abnormality. Musculoskeletal: No acute or destructive bony lesions. Reconstructed images demonstrate no additional findings. IMPRESSION: 1. Patchy bilateral ground-glass airspace disease and bronchial wall thickening, compatible with COVID pneumonia given clinical presentation. 2. Likely reactive mediastinal lymphadenopathy, greatest in the right paratracheal region. 3.  Aortic Atherosclerosis (ICD10-I70.0). Electronically Signed   By: Randa Ngo M.D.   On: 08/09/2021 16:43   US Venous Img Lower Bilateral  Result Date: 08/09/2021 CLINICAL DATA:  Bilateral lower extremity edema. History of smoking. Evaluate for DVT. EXAM: BILATERAL LOWER EXTREMITY VENOUS DOPPLER ULTRASOUND TECHNIQUE: Gray-scale sonography with graded compression, as well as color Doppler and duplex ultrasound were performed to evaluate the lower extremity deep venous systems from the level of the common femoral vein and including the common femoral, femoral, profunda femoral, popliteal and calf veins including the posterior tibial, peroneal and  gastrocnemius veins when visible. The superficial great saphenous vein was also interrogated. Spectral Doppler was utilized to evaluate flow at rest and with distal augmentation maneuvers in the common femoral, femoral and popliteal veins. COMPARISON:  None. FINDINGS: RIGHT LOWER EXTREMITY Common Femoral Vein: No evidence of thrombus. Normal compressibility, respiratory phasicity and response to augmentation. Saphenofemoral Junction: No evidence of thrombus. Normal compressibility and flow on color Doppler imaging. Profunda Femoral Vein: No evidence of thrombus. Normal compressibility and flow on color Doppler imaging. Femoral Vein: No evidence of thrombus. Normal compressibility, respiratory phasicity and response to augmentation. Popliteal Vein: No evidence of thrombus. Normal compressibility, respiratory phasicity and response to augmentation. Calf Veins: No evidence of thrombus. Normal compressibility and flow on color Doppler imaging. Superficial Great Saphenous Vein: No evidence of thrombus. Normal compressibility. Venous Reflux:  None. Other Findings: There is a minimal amount of subcutaneous edema at the level of the right calf. LEFT LOWER EXTREMITY Common Femoral Vein: No evidence of thrombus. Normal compressibility, respiratory phasicity and response to augmentation. Saphenofemoral Junction: No evidence of thrombus. Normal compressibility and flow on color Doppler imaging. Profunda Femoral Vein: No evidence of thrombus. Normal compressibility and flow on color Doppler imaging. Femoral Vein: No evidence of thrombus. Normal compressibility, respiratory phasicity and response to augmentation. Popliteal Vein: No evidence of thrombus. Normal compressibility, respiratory phasicity and response to augmentation. Calf Veins: No evidence of thrombus. Normal compressibility and flow on color Doppler imaging. Superficial Great Saphenous Vein: No evidence of thrombus. Normal compressibility. Venous Reflux:  None. Other  Findings: There is a minimal amount of subcutaneous edema at the level of the left calf. IMPRESSION: 1. No evidence of DVT within either lower extremity. 2. Minimal amount of bilateral  lower extremity subcutaneous edema. Electronically Signed   By: Sandi Mariscal M.D.   On: 08/09/2021 16:15     Assessment/Plan Principal Problem:   Acute on chronic respiratory failure (HCC) Active Problems:   Tobacco abuse   Essential hypertension   COPD (chronic obstructive pulmonary disease) (HCC)   CAD (coronary artery disease)   S/P CABG x 4   CKD stage 3 due to type 2 diabetes mellitus (HCC)   Pneumonia due to COVID-19 virus   Microcytic anemia     Patient is a 71 year old male admitted to the hospital for acute on chronic respiratory failure   Acute on chronic respiratory failure secondary to COVID-pneumonia Patient was hypoxic with room air pulse oximetry in the 70s and is currently on 4 L of oxygen with pulse oximetry of about 94%. At baseline he only uses 2 L of oxygen as needed at night Patient's initial positive COVID test was on 08/01/21. He is vaccinated against the COVID-19 virus CT chest without contrast shows patchy bilateral groundglass airspace disease and bronchial wall thickening compatible with COVID-pneumonia Will place patient on IV remdesivir per protocol Place patient on systemic steroids for hypoxia Supportive care with bronchodilator therapy, antitussives and vitamins   Chronic diastolic dysfunction CHF Last known LVEF of 60 - 65% from 05/21 Continue Lasix 40 mg p.o. twice daily Continue Metoprolol, Cozaar and spironolactone Maintain low-sodium diet       CAD S/P CABG Continue statins, beta-blocker and Plavix     Hypertension Blood pressure stable Continue Cozaar and metoprolol     Nicotine dependence Smoking cessation has been discussed with patient in detail Continue nicotine transdermal patch     Diabetes mellitus with complications of stage III chronic  kidney disease Maintain consistent carbohydrate diet Glycemic control with sliding scale coverage as well as long-acting insulin      Chronic microcytic anemia Probably related to chronic kidney disease Check iron panel       DVT prophylaxis: Lovenox  Code Status: full code  Family Communication: Greater than 50% of time was spent discussing patient's condition and plan of care with him at the bedside.  All questions and concerns have been addressed.  He verbalizes understanding and agrees with the plan. Disposition Plan: Back to previous home environment Consults called: none  Status:At the time of admission, it appears that the appropriate admission status for this patient is inpatient. This is judged to be reasonable and necessary to provide the required intensity of service to ensure the patient's safety given the presenting symptoms, physical exam findings, and initial radiographic and laboratory data in the context of their comorbid conditions. Patient requires inpatient status due to high intensity of service, high risk for further deterioration and high frequency of surveillance required.     Collier Bullock MD [facility]     08/09/2021, 6:49 PM

## 2021-08-09 NOTE — ED Provider Triage Note (Signed)
Emergency Medicine Provider Triage Evaluation Note  Joshua Roy , a 71 y.o. male  was evaluated in triage.  Pt complains of shortness of breath after testing positive for Covid 8 days ago. No improvement after antibiotics. Shortness of breath and weakness has progressed. History of COPD. No home oxygen requirement.  Review of Systems  Positive: Shortness of breath Negative: Fever  Physical Exam  BP 140/61 (BP Location: Left Arm)    Pulse 74    Temp 98.6 F (37 C)    Resp 18    Ht 5\' 10"  (1.778 m)    Wt 114 kg    SpO2 95%    BMI 36.06 kg/m  Gen:   Awake, no distress   Resp:  Normal effort  MSK:   Moves extremities without difficulty  Other:    Medical Decision Making  Medically screening exam initiated at 1:08 PM.  Appropriate orders placed.  Venia Carbon Veney was informed that the remainder of the evaluation will be completed by another provider, this initial triage assessment does not replace that evaluation, and the importance of remaining in the ED until their evaluation is complete.    Victorino Dike, FNP 08/09/21 1310

## 2021-08-09 NOTE — Consult Note (Signed)
Remdesivir - Pharmacy Brief Note   O:  ALT: No data CXR: Concerning for bronchiolitis or bronchopneumonia SpO2: Hypoxic requiring supplemental oxygen   A/P:  08/09/21 SARS-CoV-2 PCR (+)  Remdesivir 200 mg IVPB once followed by 100 mg IVPB daily x 4 days.   [name]  08/09/2021 5:03 PM

## 2021-08-09 NOTE — Progress Notes (Signed)
PHARMACIST - PHYSICIAN COMMUNICATION  CONCERNING:  Enoxaparin (Lovenox) for DVT Prophylaxis    RECOMMENDATION: Patient was prescribed enoxaparin 40mg  q24 hours for VTE prophylaxis.   Filed Weights   08/09/21 1258  Weight: 114 kg (251 lb 5.2 oz)    Body mass index is 36.06 kg/m.  Estimated Creatinine Clearance: 37 mL/min (A) (by C-G formula based on SCr of 2.28 mg/dL (H)).   Based on [name] patient is candidate for enoxaparin 0.5mg /kg TBW SQ every 24 hours based on BMI being >30.  DESCRIPTION: Pharmacy has adjusted enoxaparin dose per [facility] policy.  Patient is now receiving enoxaparin 57.5 mg every 24 hours   [name] 08/09/2021 5:08 PM

## 2021-08-09 NOTE — ED Triage Notes (Signed)
Tested positive for COVID last Thursday.  Has completed a course of antibiotics, not feeling better.  Has been checking oxygen at home, 2L/ [place],  continues to feel SOB and weak.  AAOx3.  Skin warm and dry.  No SOB/ DOE.  Not eating oxygen in triage.

## 2021-08-09 NOTE — ED Notes (Signed)
Pt is c/o SOB for about 8-9 days. Pt states he has no hx of COPD but smokes currently and been smoking for about 50 years. Pt states he wear 2L [place] at sleep at night but do to SOB, pt has been on 2L [place]. Pt is currently on 4L [place].

## 2021-08-10 DIAGNOSIS — J9620 Acute and chronic respiratory failure, unspecified whether with hypoxia or hypercapnia: Secondary | ICD-10-CM | POA: Diagnosis not present

## 2021-08-10 LAB — GLUCOSE, CAPILLARY
Glucose-Capillary: 202 mg/dL — ABNORMAL HIGH (ref 70–99)
Glucose-Capillary: 256 mg/dL — ABNORMAL HIGH (ref 70–99)
Glucose-Capillary: 212 mg/dL — ABNORMAL HIGH (ref 70–99)
Glucose-Capillary: 252 mg/dL — ABNORMAL HIGH (ref 70–99)

## 2021-08-10 LAB — D-DIMER, QUANTITATIVE: D-Dimer, Quant: 1.51 ug/mL-FEU — ABNORMAL HIGH (ref 0.00–0.50)

## 2021-08-10 LAB — PROCALCITONIN: Procalcitonin: <0.1 ng/mL

## 2021-08-10 LAB — BASIC METABOLIC PANEL
Anion gap: 9 (ref 5–15)
BUN: 40 mg/dL — ABNORMAL HIGH (ref 8–23)
CO2: 30 mmol/L (ref 22–32)
Calcium: 8.5 mg/dL — ABNORMAL LOW (ref 8.9–10.3)
Chloride: 96 mmol/L — ABNORMAL LOW (ref 98–111)
Creatinine, Ser: 2.11 mg/dL — ABNORMAL HIGH (ref 0.61–1.24)
GFR, Estimated: 33 mL/min — ABNORMAL LOW (ref >60)
Glucose, Bld: 204 mg/dL — ABNORMAL HIGH (ref 70–99)
Potassium: 4.9 mmol/L (ref 3.5–5.1)
Sodium: 135 mmol/L (ref 135–145)

## 2021-08-10 LAB — FERRITIN: Ferritin: 26 ng/mL (ref 24–336)

## 2021-08-10 MED ORDER — DEXAMETHASONE 6 MG PO TABS
6.0000 mg | ORAL_TABLET | Freq: Every day | ORAL | Status: DC
  Administered 2021-08-10 – 2021-08-13 (×4): 6 mg via ORAL
  Filled 2021-08-10 (×4): qty 1

## 2021-08-10 MED ORDER — HYDRALAZINE HCL 20 MG/ML IJ SOLN: 10.0000 mg | INTRAMUSCULAR | Status: DC | PRN

## 2021-08-10 MED ORDER — INSULIN DETEMIR 100 UNIT/ML ~~LOC~~ SOLN
20.0000 [IU] | Freq: Two times a day (BID) | SUBCUTANEOUS | Status: DC
  Administered 2021-08-10 – 2021-08-11 (×4): 20 [IU] via SUBCUTANEOUS
  Filled 2021-08-10 (×6): qty 0.2

## 2021-08-10 NOTE — Progress Notes (Addendum)
PROGRESS NOTE    Joshua Roy  YQI:[id number] DOB: 03-22-1951 DOA: 08/09/2021 PCP: [name], MD  Outpatient Specialists: cardiology    Brief Narrative:   From admission h and p  Joshua Roy is a 71 y.o. male with medical history significant for coronary artery disease status post four-vessel CABG, carotid artery disease status post carotid endarterectomy, history of A. fib, paroxysmal SVT, hypertension, diabetes mellitus with complications of stage III chronic kidney disease, COPD with chronic respiratory failure on 2 L of oxygen at night who presents to the ER for evaluation of worsening shortness of breath over the last 1 week. Patient states that symptoms started about a week ago and he had a positive home COVID test.  Wife also tested positive as well.  Symptoms include shortness of breath both at rest and with exertion associated with a cough productive of clear phlegm, weakness, increased fatigue and poor oral intake.  He also has bilateral lower extremity swelling. He denies having any fever or chills, no abdominal pain, no nausea, no vomiting, no myalgias, no headache, no blurred vision deficit.   Assessment & Plan:   Principal Problem:   Acute on chronic respiratory failure (HCC) Active Problems:   T2DM (type 2 diabetes mellitus) ([place])   Tobacco abuse   Essential hypertension   COPD (chronic obstructive pulmonary disease) (HCC)   CAD (coronary artery disease)   S/P CABG x 4   CKD stage 3 due to type 2 diabetes mellitus (HCC)   Pneumonia due to COVID-19 virus   Microcytic anemia   # Acute on chronic hypoxic respiratory failure On 2 L [place] O2 at night at home, here requiring 5 L was 70s on presentation. Respiratory status stable, no increased WOB - [place] O2, wean as able  # Covid pneumonia CT findings consistent - cont remdesivir, steroids (started 1/13) - daily labs  # Acute kidney injury on ckd stage 3 b Baseline cr around 1.7 here 2.25 likely  prerenal from above illness - po ad lib - hold home lasix/acei/spiro for now - consider low rate IVF if fails to improve  # HFpEF Does not appear to be fluid overloaded - pause diuresis as above - cont home metop  # CAD # Hx CABG Asymptomatic - cont home plavix, atorva  # HTN Here bp elevated - holding above home meds - cont metop - hydral prn  # Tobacco abuse - patch  # T2DM - hold home orals - SSI, will increase levemir to 20    DVT prophylaxis: lovenox Code Status: full Family Communication: wife updated telephonically 1/14  Level of care: Telemetry Medical Status is: Inpatient  Remains inpatient appropriate because: severity of illness        Consultants:  none  Procedures: none  Antimicrobials:  none    Subjective: This morning breathing stable, no fever, no chest pain.  Objective: Vitals:   08/09/21 2025 08/09/21 2353 08/10/21 0243 08/10/21 0847  BP: (!) 151/68 (!) 127/40 140/61 (!) 150/69  Pulse: 67 (!) 57 63 70  Resp: (!) 28 20 (!) 22 18  Temp: 98.6 F (37 C) 97.7 F (36.5 C) 98.1 F (36.7 C) 98.1 F (36.7 C)  TempSrc: Oral   Oral  SpO2: 97% 95% 92% 91%  Weight:      Height:        Intake/Output Summary (Last 24 hours) at 08/10/2021 0928 Last data filed at 08/10/2021 0246 Gross per 24 hour  Intake --  Output 950 ml  Net -950 ml   Filed Weights   08/09/21 1258  Weight: 114 kg    Examination:  General exam: Appears calm and comfortable  Respiratory system: Clear to auscultation. Respiratory effort normal. Cardiovascular system: S1 & S2 heard, RRR. No JVD, murmurs, rubs, gallops or clicks. No pedal edema. Gastrointestinal system: Abdomen is nondistended, soft and nontender. No organomegaly or masses felt. Normal bowel sounds heard. Central nervous system: Alert and oriented. No focal neurological deficits. Extremities: Symmetric 5 x 5 power. Skin: No rashes, lesions or ulcers Psychiatry: Judgement and insight appear  normal. Mood & affect appropriate.     Data Reviewed: I have personally reviewed following labs and imaging studies  CBC: Recent Labs  Lab 08/09/21 1302  WBC 6.6  HGB 10.7*  HCT 36.7*  MCV 76.6*  PLT 401   Basic Metabolic Panel: Recent Labs  Lab 08/09/21 1302  NA 137  K 4.8  CL 98  CO2 31  GLUCOSE 171*  BUN 41*  CREATININE 2.28*  CALCIUM 8.9   GFR: Estimated Creatinine Clearance: 37 mL/min (A) (by C-G formula based on SCr of 2.28 mg/dL (H)). Liver Function Tests: No results for input(s): AST, ALT, ALKPHOS, BILITOT, PROT, ALBUMIN in the last 168 hours. No results for input(s): LIPASE, AMYLASE in the last 168 hours. No results for input(s): AMMONIA in the last 168 hours. Coagulation Profile: No results for input(s): INR, PROTIME in the last 168 hours. Cardiac Enzymes: No results for input(s): CKTOTAL, CKMB, CKMBINDEX, TROPONINI in the last 168 hours. BNP (last 3 results) No results for input(s): PROBNP in the last 8760 hours. HbA1C: No results for input(s): HGBA1C in the last 72 hours. CBG: Recent Labs  Lab 08/09/21 2017 08/10/21 0844  GLUCAP 286* 202*   Lipid Profile: No results for input(s): CHOL, HDL, LDLCALC, TRIG, CHOLHDL, LDLDIRECT in the last 72 hours. Thyroid Function Tests: No results for input(s): TSH, T4TOTAL, FREET4, T3FREE, THYROIDAB in the last 72 hours. Anemia Panel: Recent Labs    08/09/21 1430 08/10/21 0601  FERRITIN  --  26  TIBC 559*  --   IRON 28*  --    Urine analysis:    Component Value Date/Time   COLORURINE STRAW (A) 10/05/2019 0957   APPEARANCEUR CLEAR 10/05/2019 0957   LABSPEC 1.005 10/05/2019 0957   PHURINE 5.0 10/05/2019 0957   GLUCOSEU NEGATIVE 10/05/2019 0957   HGBUR NEGATIVE 10/05/2019 0957   BILIRUBINUR neg 05/28/2021 1202   KETONESUR NEGATIVE 10/05/2019 0957   PROTEINUR Negative 05/28/2021 1202   PROTEINUR NEGATIVE 10/05/2019 0957   UROBILINOGEN 1.0 05/28/2021 1202   NITRITE neg 05/28/2021 1202   NITRITE  NEGATIVE 10/05/2019 0957   LEUKOCYTESUR Large (3+) (A) 05/28/2021 1202   LEUKOCYTESUR NEGATIVE 10/05/2019 0957   Sepsis Labs: @LABRCNTIP (procalcitonin:4,lacticidven:4)  ) Recent Results (from the past 240 hour(s))  Resp Panel by RT-PCR (Flu A&B, Covid) Nasopharyngeal Swab     Status: Abnormal   Collection Time: 08/09/21  3:12 PM   Specimen: Nasopharyngeal Swab; Nasopharyngeal(NP) swabs in vial transport medium  Result Value Ref Range Status   SARS Coronavirus 2 by RT PCR POSITIVE (A) NEGATIVE Final    Comment: (NOTE) SARS-CoV-2 target nucleic acids are DETECTED.  The SARS-CoV-2 RNA is generally detectable in upper respiratory specimens during the acute phase of infection. Positive results are indicative of the presence of the identified virus, but do not rule out bacterial infection or co-infection with other pathogens not detected by the test. Clinical correlation with patient history and other diagnostic information  is necessary to determine patient infection status. The expected result is Negative.  Fact Sheet for Patients: EntrepreneurPulse.com.au  Fact Sheet for Healthcare Providers: IncredibleEmployment.be  This test is not yet approved or cleared by the [place] FDA and  has been authorized for detection and/or diagnosis of SARS-CoV-2 by FDA under an Emergency Use Authorization (EUA).  This EUA will remain in effect (meaning this test can be used) for the duration of  the COVID-19 declaration under Section 564(b)(1) of the A ct, 21 U.S.C. section 360bbb-3(b)(1), unless the authorization is terminated or revoked sooner.     Influenza A by PCR NEGATIVE NEGATIVE Final   Influenza B by PCR NEGATIVE NEGATIVE Final    Comment: (NOTE) The Xpert Xpress SARS-CoV-2/FLU/RSV plus assay is intended as an aid in the diagnosis of influenza from Nasopharyngeal swab specimens and should not be used as a sole basis for treatment. Nasal  washings and aspirates are unacceptable for Xpert Xpress SARS-CoV-2/FLU/RSV testing.  Fact Sheet for Patients: EntrepreneurPulse.com.au  Fact Sheet for Healthcare Providers: IncredibleEmployment.be  This test is not yet approved or cleared by the [place] FDA and has been authorized for detection and/or diagnosis of SARS-CoV-2 by FDA under an Emergency Use Authorization (EUA). This EUA will remain in effect (meaning this test can be used) for the duration of the COVID-19 declaration under Section 564(b)(1) of the Act, 21 U.S.C. section 360bbb-3(b)(1), unless the authorization is terminated or revoked.  Performed at [facility], [address], [place], [place] [address]          Radiology Studies: DG Chest 2 View  Result Date: 08/09/2021 CLINICAL DATA:  Shortness of breath. Tested positive for COVID last Thursday. EXAM: CHEST - 2 VIEW COMPARISON:  None. FINDINGS: The heart size and mediastinal contours are within normal limits. Prominence of the bronchial markings in bilateral lower lobes concerning for viral bronchiolitis or bronchopneumonia. No focal consolidation or large pleural effusion. The visualized skeletal structures are unremarkable. IMPRESSION: Prominence of the bronchial markings in bilateral lower lobes concerning for bronchiolitis or bronchopneumonia. No focal consolidation or large pleural effusion. Electronically Signed   By: Keane Police D.O.   On: 08/09/2021 13:31   CT CHEST WO CONTRAST  Result Date: 08/09/2021 CLINICAL DATA:  COVID positive, short of breath, weakness EXAM: CT CHEST WITHOUT CONTRAST TECHNIQUE: Multidetector CT imaging of the chest was performed following the standard protocol without IV contrast. RADIATION DOSE REDUCTION: This exam was performed according to the departmental dose-optimization program which includes automated exposure control, adjustment of the mA and/or kV according to patient  size and/or use of iterative reconstruction technique. COMPARISON:  08/09/2021, 08/09/2019 FINDINGS: Cardiovascular: Unenhanced imaging of the heart and great vessels demonstrates postsurgical changes from previous CABG. No pericardial effusion. The heart is not enlarged. Normal caliber of the thoracic aorta, with diffuse atherosclerosis again identified. Evaluation of the vascular lumen is limited without IV contrast. Mediastinum/Nodes: Mediastinal and hilar adenopathy, with largest lymph node measuring 14 mm in the right paratracheal region reference image 22/2. Numerous other subcentimeter lymph nodes throughout the mediastinum and hilar regions are likely reactive. Thyroid, trachea, and esophagus are grossly unremarkable. Lungs/Pleura: Mild bilateral bronchial wall thickening. There are minimal scattered ground-glass opacities greatest in the mid and lower lung zones involving the right middle lobe, lingula, and left lower lobe. Findings are consistent with given history of COVID 19. Chronic areas of subpleural scarring are seen at the lung bases. No effusion or pneumothorax. The central airways are patent. Upper Abdomen:  No acute abnormality. Musculoskeletal: No acute or destructive bony lesions. Reconstructed images demonstrate no additional findings. IMPRESSION: 1. Patchy bilateral ground-glass airspace disease and bronchial wall thickening, compatible with COVID pneumonia given clinical presentation. 2. Likely reactive mediastinal lymphadenopathy, greatest in the right paratracheal region. 3.  Aortic Atherosclerosis (ICD10-I70.0). Electronically Signed   By: Randa Ngo M.D.   On: 08/09/2021 16:43   US Venous Img Lower Bilateral  Result Date: 08/09/2021 CLINICAL DATA:  Bilateral lower extremity edema. History of smoking. Evaluate for DVT. EXAM: BILATERAL LOWER EXTREMITY VENOUS DOPPLER ULTRASOUND TECHNIQUE: Gray-scale sonography with graded compression, as well as color Doppler and duplex ultrasound  were performed to evaluate the lower extremity deep venous systems from the level of the common femoral vein and including the common femoral, femoral, profunda femoral, popliteal and calf veins including the posterior tibial, peroneal and gastrocnemius veins when visible. The superficial great saphenous vein was also interrogated. Spectral Doppler was utilized to evaluate flow at rest and with distal augmentation maneuvers in the common femoral, femoral and popliteal veins. COMPARISON:  None. FINDINGS: RIGHT LOWER EXTREMITY Common Femoral Vein: No evidence of thrombus. Normal compressibility, respiratory phasicity and response to augmentation. Saphenofemoral Junction: No evidence of thrombus. Normal compressibility and flow on color Doppler imaging. Profunda Femoral Vein: No evidence of thrombus. Normal compressibility and flow on color Doppler imaging. Femoral Vein: No evidence of thrombus. Normal compressibility, respiratory phasicity and response to augmentation. Popliteal Vein: No evidence of thrombus. Normal compressibility, respiratory phasicity and response to augmentation. Calf Veins: No evidence of thrombus. Normal compressibility and flow on color Doppler imaging. Superficial Great Saphenous Vein: No evidence of thrombus. Normal compressibility. Venous Reflux:  None. Other Findings: There is a minimal amount of subcutaneous edema at the level of the right calf. LEFT LOWER EXTREMITY Common Femoral Vein: No evidence of thrombus. Normal compressibility, respiratory phasicity and response to augmentation. Saphenofemoral Junction: No evidence of thrombus. Normal compressibility and flow on color Doppler imaging. Profunda Femoral Vein: No evidence of thrombus. Normal compressibility and flow on color Doppler imaging. Femoral Vein: No evidence of thrombus. Normal compressibility, respiratory phasicity and response to augmentation. Popliteal Vein: No evidence of thrombus. Normal compressibility, respiratory  phasicity and response to augmentation. Calf Veins: No evidence of thrombus. Normal compressibility and flow on color Doppler imaging. Superficial Great Saphenous Vein: No evidence of thrombus. Normal compressibility. Venous Reflux:  None. Other Findings: There is a minimal amount of subcutaneous edema at the level of the left calf. IMPRESSION: 1. No evidence of DVT within either lower extremity. 2. Minimal amount of bilateral lower extremity subcutaneous edema. Electronically Signed   By: Sandi Mariscal M.D.   On: 08/09/2021 16:15        Scheduled Meds:  albuterol  2 puff Inhalation Q6H   vitamin C  500 mg Oral Daily   atorvastatin  80 mg Oral Daily   clopidogrel  75 mg Oral Daily   dexamethasone  6 mg Oral Daily   enoxaparin (LOVENOX) injection  0.5 mg/kg Subcutaneous Q24H   fenofibrate  160 mg Oral Daily   insulin aspart  0-20 Units Subcutaneous TID WC   insulin detemir  0.15 Units/kg Subcutaneous BID   metoprolol tartrate  25 mg Oral BID   nicotine  14 mg Transdermal Daily   pantoprazole  40 mg Oral QAC breakfast   spironolactone  25 mg Oral Daily   zinc sulfate  220 mg Oral Daily   Continuous Infusions:  remdesivir 100 mg in NS 100 mL  LOS: 1 day    Time spent: [place], MD [facility]   If 7PM-7AM, please contact night-coverage [email or web address] Password [facility] 08/10/2021, 9:28 AM

## 2021-08-11 DIAGNOSIS — J1282 Pneumonia due to coronavirus disease 2019: Secondary | ICD-10-CM | POA: Diagnosis not present

## 2021-08-11 DIAGNOSIS — I5033 Acute on chronic diastolic (congestive) heart failure: Secondary | ICD-10-CM

## 2021-08-11 DIAGNOSIS — J9621 Acute and chronic respiratory failure with hypoxia: Secondary | ICD-10-CM

## 2021-08-11 DIAGNOSIS — U071 COVID-19: Secondary | ICD-10-CM | POA: Diagnosis not present

## 2021-08-11 LAB — BASIC METABOLIC PANEL
Anion gap: 9 (ref 5–15)
BUN: 41 mg/dL — ABNORMAL HIGH (ref 8–23)
CO2: 32 mmol/L (ref 22–32)
Calcium: 8.9 mg/dL (ref 8.9–10.3)
Chloride: 99 mmol/L (ref 98–111)
Creatinine, Ser: 1.89 mg/dL — ABNORMAL HIGH (ref 0.61–1.24)
GFR, Estimated: 37 mL/min — ABNORMAL LOW (ref >60)
Glucose, Bld: 193 mg/dL — ABNORMAL HIGH (ref 70–99)
Potassium: 5.1 mmol/L (ref 3.5–5.1)
Sodium: 140 mmol/L (ref 135–145)

## 2021-08-11 LAB — GLUCOSE, CAPILLARY
Glucose-Capillary: 167 mg/dL — ABNORMAL HIGH (ref 70–99)
Glucose-Capillary: 240 mg/dL — ABNORMAL HIGH (ref 70–99)
Glucose-Capillary: 167 mg/dL — ABNORMAL HIGH (ref 70–99)
Glucose-Capillary: 298 mg/dL — ABNORMAL HIGH (ref 70–99)

## 2021-08-11 LAB — FERRITIN: Ferritin: 22 ng/mL — ABNORMAL LOW (ref 24–336)

## 2021-08-11 LAB — BRAIN NATRIURETIC PEPTIDE: B Natriuretic Peptide: 813.7 pg/mL — ABNORMAL HIGH (ref 0.0–100.0)

## 2021-08-11 LAB — D-DIMER, QUANTITATIVE: D-Dimer, Quant: 1.25 ug/mL-FEU — ABNORMAL HIGH (ref 0.00–0.50)

## 2021-08-11 LAB — PROCALCITONIN: Procalcitonin: <0.1 ng/mL

## 2021-08-11 MED ORDER — POLYSACCHARIDE IRON COMPLEX 150 MG PO CAPS
150.0000 mg | ORAL_CAPSULE | Freq: Every day | ORAL | Status: DC
  Administered 2021-08-11 – 2021-08-12 (×2): 150 mg via ORAL
  Filled 2021-08-11 (×3): qty 1

## 2021-08-11 MED ORDER — FUROSEMIDE 10 MG/ML IJ SOLN
60.0000 mg | Freq: Two times a day (BID) | INTRAMUSCULAR | Status: DC
  Administered 2021-08-11 – 2021-08-12 (×4): 60 mg via INTRAVENOUS
  Filled 2021-08-11 (×4): qty 8

## 2021-08-11 NOTE — Progress Notes (Signed)
PROGRESS NOTE    Joshua Roy  JJH:[id number] DOB: 19-Jun-1951 DOA: 08/09/2021 PCP: [name], MD   Chief complaint.  Shortness of breath. Brief Narrative:  Joshua Roy is a 71 y.o. male with medical history significant for coronary artery disease status post four-vessel CABG, carotid artery disease status post carotid endarterectomy, history of A. fib, paroxysmal SVT, hypertension, diabetes mellitus with complications of stage III chronic kidney disease, COPD with chronic respiratory failure on 2 L of oxygen at night who presents to the ER for evaluation of worsening shortness of breath over the last 1 week. Patient was found to have positive COVID, chest x-ray showed scattered bilateral groundglass opacities.  He was placed on steroids and remdesivir for viral pneumonia.  Assessment & Plan:   Principal Problem:   Acute on chronic respiratory failure (HCC) Active Problems:   T2DM (type 2 diabetes mellitus) ([place])   Tobacco abuse   Essential hypertension   COPD (chronic obstructive pulmonary disease) (HCC)   CAD (coronary artery disease)   S/P CABG x 4   CKD stage 3 due to type 2 diabetes mellitus ([place])   Pneumonia due to COVID-19 virus   Microcytic anemia  Acute on chronic hypoxemic respiratory failure. COVID-19 pneumonia. Acute on chronic diastolic congestive heart failure. Patient is still on 5 L oxygen, short of breath with exertion. I personally reviewed the patient chest x-ray and CT scan images, significant groundglass opacities.  This is consistent with COVID-19 pneumonia.  Continue IV steroids and to complete a course of remdesivir. Check a CRP tomorrow. Patient procalcitonin level less than 0.1, no secondary bacterial pneumonia Patient BNP was elevated at 428.4 at time admission, increased to 813.7 today with renal function improving.  Patient has acute on chronic diastolic congestive heart failure which probably is the cause of worsening hypoxemia.   Patient Lasix was on hold at time admission, at this time, I will restart IV Lasix at 60 mg IV twice a day.  Monitor renal function closely.  Echocardiogram.  Acute kidney injury on chronic kidney stage IIIb. Reviewed previous labs, patient has chronic kidney disease stage 3b.  Due to worsening hypoxemia from congestive heart failure, will give her IV Lasix.  Iron deficient anemia  Start iron supplement orally.  Essential hypertension  Coronary artery disease status post CABG. Continue some home medicines.  Type 2 diabetes  Oral diabetic medicines are on hold.  Patient was started on increased dose of Levemir due to steroids.  DVT prophylaxis: Lovenox Code Status: full Family Communication: Called daughter and the wife, not able to reach. Disposition Plan: Patient is adamant that he will leave the hospital tomorrow, but he will not be ready for discharge.   Status is: Inpatient  Remains inpatient appropriate because: severity of disease, iv treatment        I/O last 3 completed shifts: In: 58 [P.O.:820; IV Piggyback:100] Out: 8185 [Urine:1125; Stool:250] Total I/O In: -  Out: 200 [Urine:200]     Consultants:  None  Procedures: None  Antimicrobials: None  Subjective: Patient still has significant short of breath with exertion, currently on 5 L oxygen.  He has mild leg edema. He does deny any abdominal pain or nausea vomiting.  No fever chills. No dysuria hematuria.  Objective: Vitals:   08/10/21 1654 08/10/21 2105 08/11/21 0515 08/11/21 0817  BP: 132/63 (!) 119/58 125/65 127/72  Pulse: 66 67 (!) 58 68  Resp: 18 18 18 18   Temp: 98.1 F (36.7 C) 98 F (36.7 C)  98.5 F (36.9 C) (!) 97.5 F (36.4 C)  TempSrc: Oral Oral Oral Oral  SpO2: 96% 98% 94% 93%  Weight:      Height:        Intake/Output Summary (Last 24 hours) at 08/11/2021 1007 Last data filed at 08/11/2021 0853 Gross per 24 hour  Intake 920 ml  Output 1000 ml  Net -80 ml   Filed Weights    08/09/21 1258  Weight: 114 kg    Examination:  General exam: Appears calm and comfortable  Respiratory system: Significantly decreased breathing sounds with minimal air movements. Respiratory effort normal. Cardiovascular system: S1 & S2 heard, RRR. No JVD, murmurs, rubs, gallops or clicks.  Gastrointestinal system: Abdomen is nondistended, soft and nontender. No organomegaly or masses felt. Normal bowel sounds heard. Central nervous system: Alert and oriented. No focal neurological deficits. Extremities: 1+ leg edema Skin: No rashes, lesions or ulcers Psychiatry: Judgement and insight appear normal. Mood & affect appropriate.     Data Reviewed: I have personally reviewed following labs and imaging studies  CBC: Recent Labs  Lab 08/09/21 1302  WBC 6.6  HGB 10.7*  HCT 36.7*  MCV 76.6*  PLT 951   Basic Metabolic Panel: Recent Labs  Lab 08/09/21 1302 08/10/21 0930 08/11/21 0547  NA 137 135 140  K 4.8 4.9 5.1  CL 98 96* 99  CO2 31 30 32  GLUCOSE 171* 204* 193*  BUN 41* 40* 41*  CREATININE 2.28* 2.11* 1.89*  CALCIUM 8.9 8.5* 8.9   GFR: Estimated Creatinine Clearance: 44.7 mL/min (A) (by C-G formula based on SCr of 1.89 mg/dL (H)). Liver Function Tests: No results for input(s): AST, ALT, ALKPHOS, BILITOT, PROT, ALBUMIN in the last 168 hours. No results for input(s): LIPASE, AMYLASE in the last 168 hours. No results for input(s): AMMONIA in the last 168 hours. Coagulation Profile: No results for input(s): INR, PROTIME in the last 168 hours. Cardiac Enzymes: No results for input(s): CKTOTAL, CKMB, CKMBINDEX, TROPONINI in the last 168 hours. BNP (last 3 results) No results for input(s): PROBNP in the last 8760 hours. HbA1C: No results for input(s): HGBA1C in the last 72 hours. CBG: Recent Labs  Lab 08/10/21 0844 08/10/21 1142 08/10/21 1650 08/10/21 2102 08/11/21 0814  GLUCAP 202* 256* 212* 252* 167*   Lipid Profile: No results for input(s): CHOL, HDL,  LDLCALC, TRIG, CHOLHDL, LDLDIRECT in the last 72 hours. Thyroid Function Tests: No results for input(s): TSH, T4TOTAL, FREET4, T3FREE, THYROIDAB in the last 72 hours. Anemia Panel: Recent Labs    08/09/21 1430 08/10/21 0601 08/11/21 0547  FERRITIN  --  26 22*  TIBC 559*  --   --   IRON 28*  --   --    Sepsis Labs: Recent Labs  Lab 08/09/21 1435 08/10/21 0601 08/11/21 0547  PROCALCITON <0.10 <0.10 <0.10    Recent Results (from the past 240 hour(s))  Resp Panel by RT-PCR (Flu A&B, Covid) Nasopharyngeal Swab     Status: Abnormal   Collection Time: 08/09/21  3:12 PM   Specimen: Nasopharyngeal Swab; Nasopharyngeal(NP) swabs in vial transport medium  Result Value Ref Range Status   SARS Coronavirus 2 by RT PCR POSITIVE (A) NEGATIVE Final    Comment: (NOTE) SARS-CoV-2 target nucleic acids are DETECTED.  The SARS-CoV-2 RNA is generally detectable in upper respiratory specimens during the acute phase of infection. Positive results are indicative of the presence of the identified virus, but do not rule out bacterial infection or co-infection with other pathogens  not detected by the test. Clinical correlation with patient history and other diagnostic information is necessary to determine patient infection status. The expected result is Negative.  Fact Sheet for Patients: EntrepreneurPulse.com.au  Fact Sheet for Healthcare Providers: IncredibleEmployment.be  This test is not yet approved or cleared by the [place] FDA and  has been authorized for detection and/or diagnosis of SARS-CoV-2 by FDA under an Emergency Use Authorization (EUA).  This EUA will remain in effect (meaning this test can be used) for the duration of  the COVID-19 declaration under Section 564(b)(1) of the A ct, 21 U.S.C. section 360bbb-3(b)(1), unless the authorization is terminated or revoked sooner.     Influenza A by PCR NEGATIVE NEGATIVE Final   Influenza B by  PCR NEGATIVE NEGATIVE Final    Comment: (NOTE) The Xpert Xpress SARS-CoV-2/FLU/RSV plus assay is intended as an aid in the diagnosis of influenza from Nasopharyngeal swab specimens and should not be used as a sole basis for treatment. Nasal washings and aspirates are unacceptable for Xpert Xpress SARS-CoV-2/FLU/RSV testing.  Fact Sheet for Patients: EntrepreneurPulse.com.au  Fact Sheet for Healthcare Providers: IncredibleEmployment.be  This test is not yet approved or cleared by the [place] FDA and has been authorized for detection and/or diagnosis of SARS-CoV-2 by FDA under an Emergency Use Authorization (EUA). This EUA will remain in effect (meaning this test can be used) for the duration of the COVID-19 declaration under Section 564(b)(1) of the Act, 21 U.S.C. section 360bbb-3(b)(1), unless the authorization is terminated or revoked.  Performed at [facility], [address], [place], [place] [address]          Radiology Studies: DG Chest 2 View  Result Date: 08/09/2021 CLINICAL DATA:  Shortness of breath. Tested positive for COVID last Thursday. EXAM: CHEST - 2 VIEW COMPARISON:  None. FINDINGS: The heart size and mediastinal contours are within normal limits. Prominence of the bronchial markings in bilateral lower lobes concerning for viral bronchiolitis or bronchopneumonia. No focal consolidation or large pleural effusion. The visualized skeletal structures are unremarkable. IMPRESSION: Prominence of the bronchial markings in bilateral lower lobes concerning for bronchiolitis or bronchopneumonia. No focal consolidation or large pleural effusion. Electronically Signed   By: Keane Police D.O.   On: 08/09/2021 13:31   CT CHEST WO CONTRAST  Result Date: 08/09/2021 CLINICAL DATA:  COVID positive, short of breath, weakness EXAM: CT CHEST WITHOUT CONTRAST TECHNIQUE: Multidetector CT imaging of the chest was performed following  the standard protocol without IV contrast. RADIATION DOSE REDUCTION: This exam was performed according to the departmental dose-optimization program which includes automated exposure control, adjustment of the mA and/or kV according to patient size and/or use of iterative reconstruction technique. COMPARISON:  08/09/2021, 08/09/2019 FINDINGS: Cardiovascular: Unenhanced imaging of the heart and great vessels demonstrates postsurgical changes from previous CABG. No pericardial effusion. The heart is not enlarged. Normal caliber of the thoracic aorta, with diffuse atherosclerosis again identified. Evaluation of the vascular lumen is limited without IV contrast. Mediastinum/Nodes: Mediastinal and hilar adenopathy, with largest lymph node measuring 14 mm in the right paratracheal region reference image 22/2. Numerous other subcentimeter lymph nodes throughout the mediastinum and hilar regions are likely reactive. Thyroid, trachea, and esophagus are grossly unremarkable. Lungs/Pleura: Mild bilateral bronchial wall thickening. There are minimal scattered ground-glass opacities greatest in the mid and lower lung zones involving the right middle lobe, lingula, and left lower lobe. Findings are consistent with given history of COVID 19. Chronic areas of subpleural scarring are seen at  the lung bases. No effusion or pneumothorax. The central airways are patent. Upper Abdomen: No acute abnormality. Musculoskeletal: No acute or destructive bony lesions. Reconstructed images demonstrate no additional findings. IMPRESSION: 1. Patchy bilateral ground-glass airspace disease and bronchial wall thickening, compatible with COVID pneumonia given clinical presentation. 2. Likely reactive mediastinal lymphadenopathy, greatest in the right paratracheal region. 3.  Aortic Atherosclerosis (ICD10-I70.0). Electronically Signed   By: Randa Ngo M.D.   On: 08/09/2021 16:43   US Venous Img Lower Bilateral  Result Date: 08/09/2021 CLINICAL  DATA:  Bilateral lower extremity edema. History of smoking. Evaluate for DVT. EXAM: BILATERAL LOWER EXTREMITY VENOUS DOPPLER ULTRASOUND TECHNIQUE: Gray-scale sonography with graded compression, as well as color Doppler and duplex ultrasound were performed to evaluate the lower extremity deep venous systems from the level of the common femoral vein and including the common femoral, femoral, profunda femoral, popliteal and calf veins including the posterior tibial, peroneal and gastrocnemius veins when visible. The superficial great saphenous vein was also interrogated. Spectral Doppler was utilized to evaluate flow at rest and with distal augmentation maneuvers in the common femoral, femoral and popliteal veins. COMPARISON:  None. FINDINGS: RIGHT LOWER EXTREMITY Common Femoral Vein: No evidence of thrombus. Normal compressibility, respiratory phasicity and response to augmentation. Saphenofemoral Junction: No evidence of thrombus. Normal compressibility and flow on color Doppler imaging. Profunda Femoral Vein: No evidence of thrombus. Normal compressibility and flow on color Doppler imaging. Femoral Vein: No evidence of thrombus. Normal compressibility, respiratory phasicity and response to augmentation. Popliteal Vein: No evidence of thrombus. Normal compressibility, respiratory phasicity and response to augmentation. Calf Veins: No evidence of thrombus. Normal compressibility and flow on color Doppler imaging. Superficial Great Saphenous Vein: No evidence of thrombus. Normal compressibility. Venous Reflux:  None. Other Findings: There is a minimal amount of subcutaneous edema at the level of the right calf. LEFT LOWER EXTREMITY Common Femoral Vein: No evidence of thrombus. Normal compressibility, respiratory phasicity and response to augmentation. Saphenofemoral Junction: No evidence of thrombus. Normal compressibility and flow on color Doppler imaging. Profunda Femoral Vein: No evidence of thrombus. Normal  compressibility and flow on color Doppler imaging. Femoral Vein: No evidence of thrombus. Normal compressibility, respiratory phasicity and response to augmentation. Popliteal Vein: No evidence of thrombus. Normal compressibility, respiratory phasicity and response to augmentation. Calf Veins: No evidence of thrombus. Normal compressibility and flow on color Doppler imaging. Superficial Great Saphenous Vein: No evidence of thrombus. Normal compressibility. Venous Reflux:  None. Other Findings: There is a minimal amount of subcutaneous edema at the level of the left calf. IMPRESSION: 1. No evidence of DVT within either lower extremity. 2. Minimal amount of bilateral lower extremity subcutaneous edema. Electronically Signed   By: Sandi Mariscal M.D.   On: 08/09/2021 16:15        Scheduled Meds:  albuterol  2 puff Inhalation Q6H   vitamin C  500 mg Oral Daily   atorvastatin  80 mg Oral Daily   clopidogrel  75 mg Oral Daily   dexamethasone  6 mg Oral Daily   enoxaparin (LOVENOX) injection  0.5 mg/kg Subcutaneous Q24H   fenofibrate  160 mg Oral Daily   furosemide  60 mg Intravenous Q12H   insulin aspart  0-20 Units Subcutaneous TID WC   insulin detemir  20 Units Subcutaneous BID   metoprolol tartrate  25 mg Oral BID   nicotine  14 mg Transdermal Daily   pantoprazole  40 mg Oral QAC breakfast   zinc sulfate  220 mg Oral  Daily   Continuous Infusions:  remdesivir 100 mg in NS 100 mL 100 mg (08/11/21 0933)     LOS: 2 days    Time spent: 32 minutes    Sharen Hones, MD [facility]   To contact the attending provider between 7A-7P or the covering provider during after hours 7P-7A, please log into the web site [email or web address] and access using universal [place] password for that web site. If you do not have the password, please call the hospital operator.  08/11/2021, 10:07 AM

## 2021-08-11 NOTE — Progress Notes (Signed)
PT Cancellation Note  Patient Details Name: Joshua Roy MRN: [id number] DOB: 01-30-1951   Cancelled Treatment:    Reason Eval/Treat Not Completed: PT screened, no needs identified, will sign off PT orders received, chart reviewed. Per nurse, pt is independent with mobility & no acute PT needs at this time. PT to sign off. Please re-consult if new needs arise.   Lavone Nian, PT, DPT 08/11/21, 10:10 AM   Waunita Schooner 08/11/2021, 10:09 AM

## 2021-08-12 ENCOUNTER — Inpatient Hospital Stay (HOSPITAL_COMMUNITY)
Admit: 2021-08-12 | Discharge: 2021-08-12 | Disposition: A | Discharge status: Home / Self Care | Payer: Medicare Other | Attending: Internal Medicine | Admitting: Internal Medicine

## 2021-08-12 DIAGNOSIS — I5031 Acute diastolic (congestive) heart failure: Secondary | ICD-10-CM | POA: Diagnosis not present

## 2021-08-12 DIAGNOSIS — J1282 Pneumonia due to coronavirus disease 2019: Secondary | ICD-10-CM | POA: Diagnosis not present

## 2021-08-12 DIAGNOSIS — I5033 Acute on chronic diastolic (congestive) heart failure: Secondary | ICD-10-CM | POA: Diagnosis not present

## 2021-08-12 DIAGNOSIS — U071 COVID-19: Secondary | ICD-10-CM | POA: Diagnosis not present

## 2021-08-12 DIAGNOSIS — J9621 Acute and chronic respiratory failure with hypoxia: Secondary | ICD-10-CM | POA: Diagnosis not present

## 2021-08-12 LAB — ECHOCARDIOGRAM COMPLETE
AR max vel: 1.98 cm2
AV Area VTI: 2.16 cm2
AV Area mean vel: 1.85 cm2
AV Mean grad: 6 mmHg
AV Peak grad: 11.8 mmHg
Ao pk vel: 1.72 m/s
Area-P 1/2: 4.1 cm2
Height: 70 in
MV VTI: 2.67 cm2
S' Lateral: 3.94 cm
Weight: 4021.19 oz

## 2021-08-12 LAB — C-REACTIVE PROTEIN: CRP: 0.7 mg/dL (ref <1.0)

## 2021-08-12 LAB — BASIC METABOLIC PANEL
Anion gap: 7 (ref 5–15)
BUN: 44 mg/dL — ABNORMAL HIGH (ref 8–23)
CO2: 41 mmol/L — ABNORMAL HIGH (ref 22–32)
Calcium: 9.1 mg/dL (ref 8.9–10.3)
Chloride: 94 mmol/L — ABNORMAL LOW (ref 98–111)
Creatinine, Ser: 2.02 mg/dL — ABNORMAL HIGH (ref 0.61–1.24)
GFR, Estimated: 34 mL/min — ABNORMAL LOW (ref >60)
Glucose, Bld: 94 mg/dL (ref 70–99)
Potassium: 5 mmol/L (ref 3.5–5.1)
Sodium: 142 mmol/L (ref 135–145)

## 2021-08-12 LAB — MAGNESIUM: Magnesium: 1.9 mg/dL (ref 1.7–2.4)

## 2021-08-12 LAB — GLUCOSE, CAPILLARY
Glucose-Capillary: 74 mg/dL (ref 70–99)
Glucose-Capillary: 126 mg/dL — ABNORMAL HIGH (ref 70–99)
Glucose-Capillary: 270 mg/dL — ABNORMAL HIGH (ref 70–99)
Glucose-Capillary: 280 mg/dL — ABNORMAL HIGH (ref 70–99)

## 2021-08-12 LAB — FERRITIN: Ferritin: 19 ng/mL — ABNORMAL LOW (ref 24–336)

## 2021-08-12 LAB — PHOSPHORUS: Phosphorus: 3.5 mg/dL (ref 2.5–4.6)

## 2021-08-12 LAB — D-DIMER, QUANTITATIVE: D-Dimer, Quant: 1.25 ug/mL-FEU — ABNORMAL HIGH (ref 0.00–0.50)

## 2021-08-12 MED ORDER — INSULIN DETEMIR 100 UNIT/ML ~~LOC~~ SOLN
20.0000 [IU] | Freq: Every day | SUBCUTANEOUS | Status: DC
  Administered 2021-08-13: 20 [IU] via SUBCUTANEOUS
  Filled 2021-08-12: qty 0.2

## 2021-08-12 NOTE — Progress Notes (Signed)
Patient repeatedly asking to discharge home on 4 liters of oxygen despite multiple conversations about his oxygen demands and how they have recently changed. Patient was educated about oxygen use and this nurse demonstrated to patient how his oxygen levels decrease without supplemental oxygen. Patient does not care and still wishes to be discharged.

## 2021-08-12 NOTE — Consult Note (Signed)
Cardiology Consultation:   Due to the COVID-19 pandemic, this visit was completed with telemedicine (audio/video) technology to reduce patient and provider exposure as well as to preserve personal protective equipment.   Patient ID: Joshua Roy MRN: [id number]; DOB: Sep 16, 1950  Admit date: 08/09/2021 Date of Consult: 08/12/2021  Primary Care Provider: [name], MD Primary Cardiologist: Dr. Rockey Situ Primary Electrophysiologist:  None   Patient Profile:   Joshua Roy is a 71 y.o. male with a hx of CAD status post CABG in March 2021, carotid disease status post right CEA 10/15/2019, postoperative A. fib, hypertension, hyperlipidemia, type 2 diabetes, tobacco abuse, COPD on 2L, obesity, CKD stage III, HFpEF, PSVT who is being seen today for the evaluation of  at the request of Dr. Roosevelt Locks.  History of Present Illness:   Joshua Roy is followed by Dr. Rockey Situ for the above cardiac issues.  Patient initially underwent cardiac cath 2001, revealing occluded mid to distal RCA, and was medically managed.  In January 2021, he had a profound episode of presyncope followed by brief episode of syncope for which she was seen in the [facility] ED, but left AMA.  He was subsequently noted to have progressive weight gain, dyspnea, lower extremity and scrotal edema, and orthopnea.  Echo showed normal LV function with grade 1 diastolic dysfunction and no significant valve disease.  He underwent diagnostic catheterization showing severe, multivessel CAD and underwent CABG x4 in March 2021.  Preop carotid Doppler showed severe right internal carotid artery stenosis this was treated with right carotid endarterectomy at the time of his CABG.  Postop course was minimally complicated by A. fib which was successfully managed with amiodarone, which has since been discontinued.  In April he was volume overloaded CT surgical visit Lasix was increased to 40 mg twice daily.  In Dec 07, 2019, he remained volume up  and was hypoxic.  He was sent to the ER and subsequently admitted and aggressively diuresed.  Echo during hospitalization showed normal LV function 55 to 60%, mild LVH.  He was discharged home down 30 pounds.  He was last seen 03/25/2021 and was overall symptomatically stable.  He thinks he was taking Lasix twice a day.  He was euvolemic upon visit.  The patient presented to the ED 08/09/2021 for worsening shortness of breath x1.  He denied fever, chest pain, syncope, nausea, vomiting.  Also reported lower leg edema.  He reported compliance with his Lasix.  In the ER blood pressure 140/61, pulse 74, respiratory rate 18, afebrile, O2 74%.  Labs showed potassium 4.8, creatinine 2.28, BUN 41, hemoglobin 10.7.  BNP 428. HS trop 15. COVID positive. EKG showed NSR, 1st degree AV block, nonspecific ST changes.  Chest x-ray showed bronchial markings bilateral lobes concerning for bronchiolitis or bronchopneumonia.  Ultrasound DVT negative.  CT chest showed possible COVID-pneumonia.Pt was started on IV abd and admitted for further work-up.   On 1/16 the patient was still hypoxic on 5LO2. BNP elevated to 800. Started on IV lasix. Spoke to the patient on the phone, he denies chest pain. Says he wants to leave this afternoon, unwilling to stay another night.   Past Medical History:  Diagnosis Date   (HFpEF) heart failure with preserved ejection fraction ([place])    a. 07/2019 Echo: EF 60-65%. Mod LVH. Gr1 DD. No rwma. Nl RV size/fxn. Mildly dil LA; b. 11/2019 Echo: EF 55-60%, no rwma, mild LVH. Nl RV size/fxn. Mod dil LA.   ACE-inhibitor cough    Anxiety  Bronchitis 06/27/2016   Carotid arterial disease ([place])    a. 09/2019 Carotid U/S: RICA 96-28%, LICA 3-66%; b. 08/9474 R CEA.   CKD (chronic kidney disease), stage III (HCC)    COPD (chronic obstructive pulmonary disease) (HCC)    cath, mild inf. hypokinesis EF 49%, 100% ROA   Coronary artery disease    a. 2001 Cath: LM 10, LAD 60p/m, D1 30, LCX 20p, RCA  117m/d-->Med Rx; b. 05/2017 Ex Mv: Ex time 5:46, antlat twi @ rest, fixed infarct w/ mild peri-infarct ischemia. EF 38%; c. 08/2019 Cath: LM 40ost, 70d, LAD 61m, LCX 92m, RCA 80-90p, 110m; d. 09/2019 CABG x4: LIMA->LAD, VG->RI, VG->OM, VG->RPDA.   Diabetes mellitus type II    Hyperlipidemia    Hypertension    MI (myocardial infarction) [facility]) age 73   OA (osteoarthritis)    knee OA, injected 2012 by ortho   PAF (paroxysmal atrial fibrillation) ([place])    a. 09/2019 post-op CABG-->Amio.   Pneumonia    PSVT (paroxysmal supraventricular tachycardia) ([place])    a. 08/2019 Zio: 189 brief runs of SVT (fastest 174 x 6 beats, longest 15 beats @ 107).   Tobacco abuse     Past Surgical History:  Procedure Laterality Date   CARDIAC CATHETERIZATION  05/06/2000   @ [place]   COLONOSCOPY WITH PROPOFOL N/A 04/20/2018   Procedure: COLONOSCOPY WITH PROPOFOL;  Surgeon: [name], MD;  Location: [facility] ENDOSCOPY;  Service: Endoscopy;  Laterality: N/A;   CORONARY ARTERY BYPASS GRAFT N/A 10/07/2019   Procedure: CORONARY ARTERY BYPASS GRAFTING (CABG) using LIMA to LAD; Endoscopic harvesting of left greater saphenous vein: SVG to RAMUS; SVG to OM1; SVG to PDA.;  Surgeon: Gaye Pollack, MD;  Location: [facility] OR;  Service: Open Heart Surgery;  Laterality: N/A;   ENDARTERECTOMY Right 10/07/2019   Procedure: ENDARTERECTOMY CAROTID;  Surgeon: Rosetta Posner, MD;  Location: [facility] OR;  Service: Vascular;  Laterality: Right;   ENDOVEIN HARVEST OF GREATER SAPHENOUS VEIN Left 10/07/2019   Procedure: [place] Of Greater Saphenous Vein;  Surgeon: Gaye Pollack, MD;  Location[facility] OR;  Service: Open Heart Surgery;  Laterality: Left;   ESOPHAGOGASTRODUODENOSCOPY ENDOSCOPY     KNEE ARTHROSCOPY  07/25/04   left   [place]   right, open surgery- repair   RIGHT/LEFT HEART CATH AND CORONARY ANGIOGRAPHY Bilateral 09/08/2019   Procedure: RIGHT/LEFT HEART CATH AND CORONARY ANGIOGRAPHY;  Surgeon: Minna Merritts, MD;  Location:  [place] CV LAB;  Service: Cardiovascular;  Laterality: Bilateral;   TEE WITHOUT CARDIOVERSION N/A 10/07/2019   Procedure: TRANSESOPHAGEAL ECHOCARDIOGRAM (TEE);  Surgeon: Gaye Pollack, MD;  Location: [place];  Service: Open Heart Surgery;  Laterality: N/A;   TOTAL KNEE ARTHROPLASTY Left 07/14/2016   Procedure: LEFT TOTAL KNEE ARTHROPLASTY;  Surgeon: Gaynelle Arabian, MD;  Location: [facility] ORS;  Service: Orthopedics;  Laterality: Left;   TOTAL KNEE ARTHROPLASTY Right 07/13/2017   Procedure: RIGHT TOTAL KNEE ARTHROPLASTY;  Surgeon: Gaynelle Arabian, MD;  Location: [facility] ORS;  Service: Orthopedics;  Laterality: Right;     Home Medications:  Prior to Admission medications   Medication Sig Start Date End Date Taking? Authorizing Provider  atorvastatin (LIPITOR) 80 MG tablet Take 1 tablet (80 mg total) by mouth daily. 03/25/21  Yes Minna Merritts, MD  clopidogrel (PLAVIX) 75 MG tablet Take 1 tablet (75 mg total) by mouth daily. 03/25/21  Yes Gollan, Kathlene November, MD  fenofibrate 160 MG tablet TAKE 1 TABLET BY MOUTH DAILY Patient taking  differently: Take 160 mg by mouth daily. 03/29/21  Yes Masoud, Viann Shove, MD  furosemide (LASIX) 40 MG tablet Take 1 tablet (40 mg total) by mouth 2 (two) times daily. 03/25/21  Yes Gollan, Kathlene November, MD  glimepiride (AMARYL) 2 MG tablet TAKE 1/2 TABLET BY MOUTH IN THE MORNING Patient taking differently: Take 1 mg by mouth daily with breakfast. 03/11/21  Yes Masoud, Viann Shove, MD  losartan (COZAAR) 50 MG tablet Take 1 tablet (50 mg total) by mouth daily. 03/25/21  Yes Gollan, Kathlene November, MD  metFORMIN (GLUCOPHAGE) 1000 MG tablet TAKE 1 TABLET BY MOUTH TWICE A DAY Patient taking differently: Take 1,000 mg by mouth 2 (two) times daily with a meal. 10/08/20  Yes Masoud, Viann Shove, MD  metoprolol tartrate (LOPRESSOR) 25 MG tablet Take 1 tablet (25 mg total) by mouth 2 (two) times daily. 03/25/21  Yes Minna Merritts, MD  pantoprazole (PROTONIX) 40 MG tablet Take 1 tablet (40 mg total) by mouth  daily before breakfast. 09/19/20  Yes Masoud, Viann Shove, MD  spironolactone (ALDACTONE) 25 MG tablet Take 1 tablet (25 mg total) by mouth daily. 03/25/21 08/09/21 Yes Gollan, Kathlene November, MD  ALPRAZolam Duanne Moron) 0.25 MG tablet Take 1 tablet (0.25 mg total) by mouth 2 (two) times daily as needed for anxiety. 05/28/21   [name], MD  glucose blood (TRUE METRIX BLOOD GLUCOSE TEST) test strip 1 each by Other route daily. Use as instructed 01/24/20   [name], MD  nitroGLYCERIN (NITROSTAT) 0.4 MG SL tablet Place 0.4 mg under the tongue every 5 (five) minutes as needed for chest pain.    [provider]  sildenafil (REVATIO) 20 MG tablet TAKE ONE TABLET BY MOUTH DAILY AS NEEDED 12/28/20   Beckie Salts, FNP  sildenafil (VIAGRA) 100 MG tablet Take 1 tablet (100 mg total) by mouth daily as needed for erectile dysfunction. 06/18/20   [name], MD  traZODone (DESYREL) 50 MG tablet TAKE 1 TABLET BY MOUTH EVERY DAY Patient not taking: Reported on 08/09/2021 03/11/21   [name], MD    Inpatient Medications: Scheduled Meds:  albuterol  2 puff Inhalation Q6H   vitamin C  500 mg Oral Daily   atorvastatin  80 mg Oral Daily   clopidogrel  75 mg Oral Daily   dexamethasone  6 mg Oral Daily   enoxaparin (LOVENOX) injection  0.5 mg/kg Subcutaneous Q24H   fenofibrate  160 mg Oral Daily   furosemide  60 mg Intravenous Q12H   insulin aspart  0-20 Units Subcutaneous TID WC   [START ON 08/13/2021] insulin detemir  20 Units Subcutaneous Daily   iron polysaccharides  150 mg Oral Daily   metoprolol tartrate  25 mg Oral BID   nicotine  14 mg Transdermal Daily   pantoprazole  40 mg Oral QAC breakfast   zinc sulfate  220 mg Oral Daily   Continuous Infusions:  remdesivir 100 mg in NS 100 mL 100 mg (08/12/21 0931)   PRN Meds: acetaminophen, ALPRAZolam, chlorpheniramine-HYDROcodone, hydrALAZINE, nitroGLYCERIN, ondansetron **OR** ondansetron (ZOFRAN) IV  Allergies:    Allergies  Allergen Reactions    Ramipril Cough and Other (See Comments)    REACTION: Cough   Pseudoephedrine-Guaifenesin Er Other (See Comments) and Hypertension    Elevated BP, insomnia Elevated BP, insomnia    Social History:   Social History   Socioeconomic History   Marital status: Married    Spouse name: Janie   Number of children: 3   Years of education: Not on file  Highest education level: Not on file  Occupational History   Occupation: Engineer, manufacturing, Estate agent  Tobacco Use   Smoking status: Every Day    Packs/day: 1.00    Years: 40.00    Pack years: 40.00    Types: Cigarettes    Last attempt to quit: 10/07/2019    Years since quitting: 1.8   Smokeless tobacco: Never   Tobacco comments:    per patient wears a patch and has cut down on cigarettes/day (10/05/2019)  Vaping Use   Vaping Use: Never used  Substance and Sexual Activity   Alcohol use: Yes    Alcohol/week: 0.0 standard drinks    Comment: 5-6 cocktails, 1 times a week   Drug use: No   Sexual activity: Not on file  Other Topics Concern   Not on file  Social History Narrative   From [place].  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   Social Determinants of Health   Financial Resource Strain: Low Risk    Difficulty of Paying Living Expenses: Not hard at all  Food Insecurity: Not on file  Transportation Needs: No Transportation Needs   Lack of Transportation (Medical): No   Lack of Transportation (Non-Medical): No  Physical Activity: Insufficiently Active   Days of Exercise per Week: 2 days   Minutes of Exercise per Session: 20 min  Stress: No Stress Concern Present   Feeling of Stress : Not at all  Social Connections: Moderately Isolated   Frequency of Communication with Friends and Family: More than three times a week   Frequency of Social Gatherings with Friends and Family: More than three times a week   Attends Religious Services: Never   Marine scientist or Organizations: No   Attends Arts administrator: Never   Marital Status: Married  Human resources officer Violence: Not At Risk   Fear of Current or Ex-Partner: No   Emotionally Abused: No   Physically Abused: No   Sexually Abused: No    Family History:    Family History  Problem Relation Age of Onset   Heart disease Father        CAD, MI x 3   Hypertension Father    Alcohol abuse Father    Diabetes Father    Diabetes Sister    Cancer Brother        lymphoma, in remission   Colon cancer Brother    Heart disease Sister        CABG x 3   Prostate cancer Neg Hx      ROS:  Please see the history of present illness.   All other ROS reviewed and negative.     Physical Exam/Data:   Vitals:   08/11/21 1624 08/11/21 1958 08/12/21 0509 08/12/21 0754  BP: 130/65 (!) 143/75 115/65 124/64  Pulse: 62 72 63 (!) 59  Resp: 18 (!) 22 (!) 22 18  Temp: 97.8 F (36.6 C) (!) 97.5 F (36.4 C) 98.1 F (36.7 C) 97.9 F (36.6 C)  TempSrc: Oral Oral Oral Oral  SpO2: 94% 93% 94% 95%  Weight:      Height:        Intake/Output Summary (Last 24 hours) at 08/12/2021 1020 Last data filed at 08/12/2021 0539 Gross per 24 hour  Intake 820 ml  Output 2200 ml  Net -1380 ml   Last 3 Weights 08/09/2021 06/12/2021 05/28/2021  Weight (lbs) 251 lb 5.2 oz 251 lb 6.4 oz 250 lb 11.2 oz  Weight (kg) 114 kg 114.034 kg 113.717 kg     Body mass index is 36.06 kg/m.   VITAL SIGNS:  reviewed  EKG:  The EKG was personally reviewed and demonstrates:  NS, first degree AV block.  Telemetry:  Telemetry was personally reviewed and demonstrates:  NSR HR 60s  Relevant CV Studies:  Echo 11/2019 1. Left ventricular ejection fraction, by estimation, is 55 to 60%. The  left ventricle has normal function. The left ventricle has no regional  wall motion abnormalities. There is mild left ventricular hypertrophy.  Left ventricular diastolic parameters  are indeterminate.   2. Right ventricular systolic function is normal. The right ventricular  size is  normal. Tricuspid regurgitation signal is inadequate for assessing  PA pressure.   3. Left atrial size was moderately dilated.   4. Challenging image quality   Laboratory Data:  Chemistry Recent Labs  Lab 08/10/21 0930 08/11/21 0547 08/12/21 0657  NA 135 140 142  K 4.9 5.1 5.0  CL 96* 99 94*  CO2 30 32 41*  GLUCOSE 204* 193* 94  BUN 40* 41* 44*  CREATININE 2.11* 1.89* 2.02*  CALCIUM 8.5* 8.9 9.1  GFRNONAA 33* 37* 34*  ANIONGAP 9 9 7     No results for input(s): PROT, ALBUMIN, AST, ALT, ALKPHOS, BILITOT in the last 168 hours. Hematology Recent Labs  Lab 08/09/21 1302  WBC 6.6  RBC 4.79  HGB 10.7*  HCT 36.7*  MCV 76.6*  MCH 22.3*  MCHC 29.2*  RDW 18.3*  PLT 373   Cardiac EnzymesNo results for input(s): TROPONINI in the last 168 hours. No results for input(s): TROPIPOC in the last 168 hours.  BNP Recent Labs  Lab 08/09/21 1430 08/11/21 0547  BNP 428.4* 813.7*    DDimer  Recent Labs  Lab 08/10/21 0601 08/11/21 0547 08/12/21 0657  DDIMER 1.51* 1.25* 1.25*    Radiology/Studies:  DG Chest 2 View  Result Date: 08/09/2021 CLINICAL DATA:  Shortness of breath. Tested positive for COVID last Thursday. EXAM: CHEST - 2 VIEW COMPARISON:  None. FINDINGS: The heart size and mediastinal contours are within normal limits. Prominence of the bronchial markings in bilateral lower lobes concerning for viral bronchiolitis or bronchopneumonia. No focal consolidation or large pleural effusion. The visualized skeletal structures are unremarkable. IMPRESSION: Prominence of the bronchial markings in bilateral lower lobes concerning for bronchiolitis or bronchopneumonia. No focal consolidation or large pleural effusion. Electronically Signed   By: Keane Police D.O.   On: 08/09/2021 13:31   CT CHEST WO CONTRAST  Result Date: 08/09/2021 CLINICAL DATA:  COVID positive, short of breath, weakness EXAM: CT CHEST WITHOUT CONTRAST TECHNIQUE: Multidetector CT imaging of the chest was performed  following the standard protocol without IV contrast. RADIATION DOSE REDUCTION: This exam was performed according to the departmental dose-optimization program which includes automated exposure control, adjustment of the mA and/or kV according to patient size and/or use of iterative reconstruction technique. COMPARISON:  08/09/2021, 08/09/2019 FINDINGS: Cardiovascular: Unenhanced imaging of the heart and great vessels demonstrates postsurgical changes from previous CABG. No pericardial effusion. The heart is not enlarged. Normal caliber of the thoracic aorta, with diffuse atherosclerosis again identified. Evaluation of the vascular lumen is limited without IV contrast. Mediastinum/Nodes: Mediastinal and hilar adenopathy, with largest lymph node measuring 14 mm in the right paratracheal region reference image 22/2. Numerous other subcentimeter lymph nodes throughout the mediastinum and hilar regions are likely reactive. Thyroid, trachea, and esophagus are grossly unremarkable. Lungs/Pleura: Mild bilateral bronchial wall thickening. There are  minimal scattered ground-glass opacities greatest in the mid and lower lung zones involving the right middle lobe, lingula, and left lower lobe. Findings are consistent with given history of COVID 19. Chronic areas of subpleural scarring are seen at the lung bases. No effusion or pneumothorax. The central airways are patent. Upper Abdomen: No acute abnormality. Musculoskeletal: No acute or destructive bony lesions. Reconstructed images demonstrate no additional findings. IMPRESSION: 1. Patchy bilateral ground-glass airspace disease and bronchial wall thickening, compatible with COVID pneumonia given clinical presentation. 2. Likely reactive mediastinal lymphadenopathy, greatest in the right paratracheal region. 3.  Aortic Atherosclerosis (ICD10-I70.0). Electronically Signed   By: Randa Ngo M.D.   On: 08/09/2021 16:43   US Venous Img Lower Bilateral  Result Date:  08/09/2021 CLINICAL DATA:  Bilateral lower extremity edema. History of smoking. Evaluate for DVT. EXAM: BILATERAL LOWER EXTREMITY VENOUS DOPPLER ULTRASOUND TECHNIQUE: Gray-scale sonography with graded compression, as well as color Doppler and duplex ultrasound were performed to evaluate the lower extremity deep venous systems from the level of the common femoral vein and including the common femoral, femoral, profunda femoral, popliteal and calf veins including the posterior tibial, peroneal and gastrocnemius veins when visible. The superficial great saphenous vein was also interrogated. Spectral Doppler was utilized to evaluate flow at rest and with distal augmentation maneuvers in the common femoral, femoral and popliteal veins. COMPARISON:  None. FINDINGS: RIGHT LOWER EXTREMITY Common Femoral Vein: No evidence of thrombus. Normal compressibility, respiratory phasicity and response to augmentation. Saphenofemoral Junction: No evidence of thrombus. Normal compressibility and flow on color Doppler imaging. Profunda Femoral Vein: No evidence of thrombus. Normal compressibility and flow on color Doppler imaging. Femoral Vein: No evidence of thrombus. Normal compressibility, respiratory phasicity and response to augmentation. Popliteal Vein: No evidence of thrombus. Normal compressibility, respiratory phasicity and response to augmentation. Calf Veins: No evidence of thrombus. Normal compressibility and flow on color Doppler imaging. Superficial Great Saphenous Vein: No evidence of thrombus. Normal compressibility. Venous Reflux:  None. Other Findings: There is a minimal amount of subcutaneous edema at the level of the right calf. LEFT LOWER EXTREMITY Common Femoral Vein: No evidence of thrombus. Normal compressibility, respiratory phasicity and response to augmentation. Saphenofemoral Junction: No evidence of thrombus. Normal compressibility and flow on color Doppler imaging. Profunda Femoral Vein: No evidence of  thrombus. Normal compressibility and flow on color Doppler imaging. Femoral Vein: No evidence of thrombus. Normal compressibility, respiratory phasicity and response to augmentation. Popliteal Vein: No evidence of thrombus. Normal compressibility, respiratory phasicity and response to augmentation. Calf Veins: No evidence of thrombus. Normal compressibility and flow on color Doppler imaging. Superficial Great Saphenous Vein: No evidence of thrombus. Normal compressibility. Venous Reflux:  None. Other Findings: There is a minimal amount of subcutaneous edema at the level of the left calf. IMPRESSION: 1. No evidence of DVT within either lower extremity. 2. Minimal amount of bilateral lower extremity subcutaneous edema. Electronically Signed   By: Sandi Mariscal M.D.   On: 08/09/2021 16:15    @[email or web address] @   Assessment and Plan:   Acute on chronic hypoxic respiratory failure COVID-19 PNA COPD on 32 LO2 - respiratory failure in the setting of COVID PNA and acute CHF - On 5L O2, baseline 2L O2 - IV steroids - started on IV lasix - patient wanting to leave, he was advised to stay  Acute on chronic HFpEF - PTA lasix 40mg  BID held on admission - Echo 10/06/20 with LVEF 60-65% - started on IV lasix 60mg  BID - UOP -2.5L  -  kidney function stable - continue with diuresis, however patient wanting to leave this afternoon and won't reconsider. May need higher lasix PO dose with close follow-up.   CAD s/p CABG - No chest pain reported - continue Plavix, statin, BB - NO further ischemic work-up at this time.   HTN - PTA losartan 50mg , Lopressor 25mg  BID, spiro 25mg  daily - ARB and spiro held on admission  For questions or updates, please contact [place] Please consult [email or web address] for contact info under     Signed, Roisin Mones Ninfa Meeker, PA-C  08/12/2021 10:20 AM

## 2021-08-12 NOTE — Progress Notes (Signed)
*  PRELIMINARY RESULTS* Echocardiogram 2D Echocardiogram has been performed.  Joshua Roy 08/12/2021, 9:45 AM

## 2021-08-12 NOTE — Progress Notes (Signed)
PROGRESS NOTE    Joshua Roy  PZW:[id number] DOB: [birth year] DOA: 08/09/2021 PCP: [name], MD   Chief complaint.  Shortness of breath. Brief Narrative:  Joshua Roy is a 71 y.o. male with medical history significant for coronary artery disease status post four-vessel CABG, carotid artery disease status post carotid endarterectomy, history of A. fib, paroxysmal SVT, hypertension, diabetes mellitus with complications of stage III chronic kidney disease, COPD with chronic respiratory failure on 2 L of oxygen at night who presents to the ER for evaluation of worsening shortness of breath over the last 1 week. Patient was found to have positive COVID, chest x-ray showed scattered bilateral groundglass opacities.  He was placed on steroids and remdesivir for viral pneumonia.   Assessment & Plan:   Principal Problem:   Acute on chronic respiratory failure (HCC) Active Problems:   T2DM (type 2 diabetes mellitus) ([place])   Tobacco abuse   Essential hypertension   COPD (chronic obstructive pulmonary disease) (HCC)   CAD (coronary artery disease)   S/P CABG x 4   Acute on chronic diastolic CHF (congestive heart failure) (HCC)   CKD stage 3 due to type 2 diabetes mellitus ([place])   Pneumonia due to COVID-19 virus   Microcytic anemia  Acute on chronic hypoxemic respiratory failure. COVID-19 pneumonia. Acute on chronic diastolic congestive heart failure. Echocardiogram is performed, pending results. Patient still on 4 L oxygen, still has some volume overload.  Continue IV Lasix. Continue steroids and remdesivir. Patient is seeing Dr. Rockey Situ in office, will consult cardiology.  Dr. Fletcher Anon will see patient today.   Acute kidney injury on chronic kidney stage IIIb. Renal function slightly worse today after giving Lasix.  Will monitor closely.   Iron deficient anemia  Continue oral supplement.   Essential hypertension  Coronary artery disease status post CABG. Stable,  continue home medicines.   Type 2 diabetes  Decrease Levemir dose as glucose is borderline low.  Obesity with a BMI of 36.06.   DVT prophylaxis: Lovenox Code Status: full Family Communication: Called again, not able to reach daughter. Disposition Plan:   Patient still want to leave the hospital today.  I could not convince him to stay.  I explained to him that his condition is very serious, he still has a respite failure.  He still need IV treatment.  He has a risk of death if he leaves hospital Joshua Roy.  I also asked Dr. Fletcher Anon to see him today, hopefully he can convince him to stay.  Not able to reach the family.  If he had to leave AMA today, I will prescribe steroids.  Status is: Inpatient   Remains inpatient appropriate because: severity of disease, iv treatment      I/O last 3 completed shifts: In: 4235 [P.O.:1680; IV Piggyback:100] Out: 3250 [Urine:3000; Stool:250] No intake/output data recorded.     Consultants:  Cardiology  Procedures: None  Antimicrobials: None  Subjective: Patient still require 4 L oxygen, baseline on 2 L. He denied he has any short of breath today.  He denies any cough. No abdominal pain or nausea vomiting. No fever or chills.  Objective: Vitals:   08/11/21 1624 08/11/21 1958 08/12/21 0509 08/12/21 0754  BP: 130/65 (!) 143/75 115/65 124/64  Pulse: 62 72 63 (!) 59  Resp: 18 (!) 22 (!) 22 18  Temp: 97.8 F (36.6 C) (!) 97.5 F (36.4 C) 98.1 F (36.7 C) 97.9 F (36.6 C)  TempSrc: Oral Oral Oral Oral  SpO2:  94% 93% 94% 95%  Weight:      Height:        Intake/Output Summary (Last 24 hours) at 08/12/2021 1037 Last data filed at 08/12/2021 0539 Gross per 24 hour  Intake 820 ml  Output 2200 ml  Net -1380 ml   Filed Weights   08/09/21 1258  Weight: 114 kg    Examination:  General exam: Appears calm and comfortable  Respiratory system: Decreased breathing sounds. Respiratory effort normal. Cardiovascular system:  S1 & S2 heard, RRR. No JVD, murmurs, rubs, gallops or clicks. No pedal edema. Gastrointestinal system: Abdomen is nondistended, soft and nontender. No organomegaly or masses felt. Normal bowel sounds heard. Central nervous system: Alert and oriented. No focal neurological deficits. Extremities: Symmetric 5 x 5 power. Skin: No rashes, lesions or ulcers Psychiatry: Judgement and insight appear normal. Mood & affect appropriate.     Data Reviewed: I have personally reviewed following labs and imaging studies  CBC: Recent Labs  Lab 08/09/21 1302  WBC 6.6  HGB 10.7*  HCT 36.7*  MCV 76.6*  PLT 702   Basic Metabolic Panel: Recent Labs  Lab 08/09/21 1302 08/10/21 0930 08/11/21 0547 08/12/21 0657  NA 137 135 140 142  K 4.8 4.9 5.1 5.0  CL 98 96* 99 94*  CO2 31 30 32 41*  GLUCOSE 171* 204* 193* 94  BUN 41* 40* 41* 44*  CREATININE 2.28* 2.11* 1.89* 2.02*  CALCIUM 8.9 8.5* 8.9 9.1  MG  --   --   --  1.9  PHOS  --   --   --  3.5   GFR: Estimated Creatinine Clearance: 41.8 mL/min (A) (by C-G formula based on SCr of 2.02 mg/dL (H)). Liver Function Tests: No results for input(s): AST, ALT, ALKPHOS, BILITOT, PROT, ALBUMIN in the last 168 hours. No results for input(s): LIPASE, AMYLASE in the last 168 hours. No results for input(s): AMMONIA in the last 168 hours. Coagulation Profile: No results for input(s): INR, PROTIME in the last 168 hours. Cardiac Enzymes: No results for input(s): CKTOTAL, CKMB, CKMBINDEX, TROPONINI in the last 168 hours. BNP (last 3 results) No results for input(s): PROBNP in the last 8760 hours. HbA1C: No results for input(s): HGBA1C in the last 72 hours. CBG: Recent Labs  Lab 08/11/21 0814 08/11/21 1112 08/11/21 1602 08/11/21 2000 08/12/21 0751  GLUCAP 167* 240* 167* 298* 74   Lipid Profile: No results for input(s): CHOL, HDL, LDLCALC, TRIG, CHOLHDL, LDLDIRECT in the last 72 hours. Thyroid Function Tests: No results for input(s): TSH, T4TOTAL,  FREET4, T3FREE, THYROIDAB in the last 72 hours. Anemia Panel: Recent Labs    08/09/21 1430 08/10/21 0601 08/11/21 0547 08/12/21 0657  FERRITIN  --    < > 22* 19*  TIBC 559*  --   --   --   IRON 28*  --   --   --    < > = values in this interval not displayed.   Sepsis Labs: Recent Labs  Lab 08/09/21 1435 08/10/21 0601 08/11/21 0547  PROCALCITON <0.10 <0.10 <0.10    Recent Results (from the past 240 hour(s))  Resp Panel by RT-PCR (Flu A&B, Covid) Nasopharyngeal Swab     Status: Abnormal   Collection Time: 08/09/21  3:12 PM   Specimen: Nasopharyngeal Swab; Nasopharyngeal(NP) swabs in vial transport medium  Result Value Ref Range Status   SARS Coronavirus 2 by RT PCR POSITIVE (A) NEGATIVE Final    Comment: (NOTE) SARS-CoV-2 target nucleic acids are DETECTED.  The SARS-CoV-2 RNA is generally detectable in upper respiratory specimens during the acute phase of infection. Positive results are indicative of the presence of the identified virus, but do not rule out bacterial infection or co-infection with other pathogens not detected by the test. Clinical correlation with patient history and other diagnostic information is necessary to determine patient infection status. The expected result is Negative.  Fact Sheet for Patients: EntrepreneurPulse.com.au  Fact Sheet for Healthcare Providers: IncredibleEmployment.be  This test is not yet approved or cleared by the [place] FDA and  has been authorized for detection and/or diagnosis of SARS-CoV-2 by FDA under an Emergency Use Authorization (EUA).  This EUA will remain in effect (meaning this test can be used) for the duration of  the COVID-19 declaration under Section 564(b)(1) of the A ct, 21 U.S.C. section 360bbb-3(b)(1), unless the authorization is terminated or revoked sooner.     Influenza A by PCR NEGATIVE NEGATIVE Final   Influenza B by PCR NEGATIVE NEGATIVE Final    Comment:  (NOTE) The Xpert Xpress SARS-CoV-2/FLU/RSV plus assay is intended as an aid in the diagnosis of influenza from Nasopharyngeal swab specimens and should not be used as a sole basis for treatment. Nasal washings and aspirates are unacceptable for Xpert Xpress SARS-CoV-2/FLU/RSV testing.  Fact Sheet for Patients: EntrepreneurPulse.com.au  Fact Sheet for Healthcare Providers: IncredibleEmployment.be  This test is not yet approved or cleared by the [place] FDA and has been authorized for detection and/or diagnosis of SARS-CoV-2 by FDA under an Emergency Use Authorization (EUA). This EUA will remain in effect (meaning this test can be used) for the duration of the COVID-19 declaration under Section 564(b)(1) of the Act, 21 U.S.C. section 360bbb-3(b)(1), unless the authorization is terminated or revoked.  Performed at [facility], [address], [place], [place] [address]          Radiology Studies: No results found.      Scheduled Meds:  albuterol  2 puff Inhalation Q6H   vitamin C  500 mg Oral Daily   atorvastatin  80 mg Oral Daily   clopidogrel  75 mg Oral Daily   dexamethasone  6 mg Oral Daily   enoxaparin (LOVENOX) injection  0.5 mg/kg Subcutaneous Q24H   fenofibrate  160 mg Oral Daily   furosemide  60 mg Intravenous Q12H   insulin aspart  0-20 Units Subcutaneous TID WC   [START ON 08/13/2021] insulin detemir  20 Units Subcutaneous Daily   iron polysaccharides  150 mg Oral Daily   metoprolol tartrate  25 mg Oral BID   nicotine  14 mg Transdermal Daily   pantoprazole  40 mg Oral QAC breakfast   zinc sulfate  220 mg Oral Daily   Continuous Infusions:  remdesivir 100 mg in NS 100 mL 100 mg (08/12/21 0931)     LOS: 3 days    Time spent: 35 minutes    Sharen Hones, MD [facility]   To contact the attending provider between 7A-7P or the covering provider during after hours 7P-7A, please log into the  web site [email or web address] and access using universal [place] password for that web site. If you do not have the password, please call the hospital operator.  08/12/2021, 10:37 AM

## 2021-08-13 DIAGNOSIS — J9621 Acute and chronic respiratory failure with hypoxia: Secondary | ICD-10-CM | POA: Diagnosis not present

## 2021-08-13 DIAGNOSIS — I5033 Acute on chronic diastolic (congestive) heart failure: Secondary | ICD-10-CM | POA: Diagnosis not present

## 2021-08-13 DIAGNOSIS — J1282 Pneumonia due to coronavirus disease 2019: Secondary | ICD-10-CM | POA: Diagnosis not present

## 2021-08-13 DIAGNOSIS — U071 COVID-19: Secondary | ICD-10-CM | POA: Diagnosis not present

## 2021-08-13 LAB — D-DIMER, QUANTITATIVE: D-Dimer, Quant: 0.9 ug/mL-FEU — ABNORMAL HIGH (ref 0.00–0.50)

## 2021-08-13 LAB — GLUCOSE, CAPILLARY
Glucose-Capillary: 150 mg/dL — ABNORMAL HIGH (ref 70–99)
Glucose-Capillary: 207 mg/dL — ABNORMAL HIGH (ref 70–99)

## 2021-08-13 LAB — BASIC METABOLIC PANEL
Anion gap: 8 (ref 5–15)
BUN: 48 mg/dL — ABNORMAL HIGH (ref 8–23)
CO2: 42 mmol/L — ABNORMAL HIGH (ref 22–32)
Calcium: 9.2 mg/dL (ref 8.9–10.3)
Chloride: 91 mmol/L — ABNORMAL LOW (ref 98–111)
Creatinine, Ser: 2.05 mg/dL — ABNORMAL HIGH (ref 0.61–1.24)
GFR, Estimated: 34 mL/min — ABNORMAL LOW (ref >60)
Glucose, Bld: 216 mg/dL — ABNORMAL HIGH (ref 70–99)
Potassium: 4.5 mmol/L (ref 3.5–5.1)
Sodium: 141 mmol/L (ref 135–145)

## 2021-08-13 LAB — FERRITIN: Ferritin: 25 ng/mL (ref 24–336)

## 2021-08-13 MED ORDER — FUROSEMIDE 10 MG/ML IJ SOLN
80.0000 mg | Freq: Two times a day (BID) | INTRAMUSCULAR | Status: DC
  Administered 2021-08-13: 80 mg via INTRAVENOUS
  Filled 2021-08-13: qty 8

## 2021-08-13 MED ORDER — DEXAMETHASONE 6 MG PO TABS: 6.0000 mg | ORAL_TABLET | Freq: Every day | ORAL | 0 refills | Status: AC

## 2021-08-13 MED ORDER — POLYSACCHARIDE IRON COMPLEX 150 MG PO CAPS: 150.0000 mg | ORAL_CAPSULE | Freq: Every day | ORAL | 0 refills | Status: DC

## 2021-08-13 MED ORDER — FUROSEMIDE 40 MG PO TABS: 80.0000 mg | ORAL_TABLET | Freq: Two times a day (BID) | ORAL | 3 refills | Status: DC

## 2021-08-13 NOTE — Progress Notes (Signed)
PROGRESS NOTE    Joshua Roy  HWE:[id number] DOB: May 23, 1951 DOA: 08/09/2021 PCP: [name], MD   Chief complaint.  Shortness of breath. Brief Narrative:  Joshua Roy is a 71 y.o. male with medical history significant for coronary artery disease status post four-vessel CABG, carotid artery disease status post carotid endarterectomy, history of A. fib, paroxysmal SVT, hypertension, diabetes mellitus with complications of stage III chronic kidney disease, COPD with chronic respiratory failure on 2 L of oxygen at night who presents to the ER for evaluation of worsening shortness of breath over the last 1 week. Patient was found to have positive COVID, chest x-ray showed scattered bilateral groundglass opacities.  He was placed on steroids and remdesivir for viral pneumonia.     Assessment & Plan:   Principal Problem:   Acute on chronic respiratory failure (HCC) Active Problems:   T2DM (type 2 diabetes mellitus) ([place])   Tobacco abuse   Essential hypertension   COPD (chronic obstructive pulmonary disease) (HCC)   CAD (coronary artery disease)   S/P CABG x 4   Acute on chronic diastolic CHF (congestive heart failure) (HCC)   CKD stage 3 due to type 2 diabetes mellitus ([place])   Pneumonia due to COVID-19 virus   Microcytic anemia  Acute on chronic hypoxemic respiratory failure. COVID-19 pneumonia. Acute on chronic diastolic congestive heart failure. Echocardiogram showed ejection fraction 55 to 60%. Patient is seen by cardiology, determined still volume overloaded.  Lasix dose was increased to 80 mg twice a day. However, patient refused to stay further in the hospital.  At this point, he is signing out [place].   Acute kidney injury on chronic kidney stage IIIb. Renal function relatively stable   Iron deficient anemia  Continue oral supplement.   Essential hypertension  Coronary artery disease status post CABG. Resume all home medicines.    Type 2 diabetes  Patient is unwilling to stay in the hospital for additional education or treatment.  At this point, I will resume all home regimen, he can follow-up with PCP as outpatient to make adjustment to his medicines.   Obesity with a BMI of 36.06.   DVT prophylaxis: Lovenox Code Status: full Family Communication: Called again, not able to reach daughter. Disposition Plan:     Status is: Inpatient     I/O last 3 completed shifts: In: -  Out: 3300 [Urine:3300] No intake/output data recorded.     Subjective: Patient feels much better today, he took off his oxygen, refused to put it back on.  He states that he is going home.  His wife is a sick, continue taking care of his wife.  There is no reason for me to believe he is not able to make his own decision.  He is alert and oriented to time place and person. He denies any abdominal pain or nausea vomiting. No dysuria hematuria.  Objective: Vitals:   08/12/21 0754 08/12/21 1939 08/13/21 0330 08/13/21 0818  BP: 124/64 116/68 116/68 112/62  Pulse: (!) 59 79 64 63  Resp: 18 20 20 18   Temp: 97.9 F (36.6 C) 97.8 F (36.6 C) 97.7 F (36.5 C) 97.8 F (36.6 C)  TempSrc: Oral Oral Oral Oral  SpO2: 95% 92% 91% 97%  Weight:      Height:        Intake/Output Summary (Last 24 hours) at 08/13/2021 1307 Last data filed at 08/13/2021 0541 Gross per 24 hour  Intake --  Output 1600 ml  Net -  1600 ml   Filed Weights   08/09/21 1258  Weight: 114 kg    Examination:  General exam: Appears calm and comfortable  Respiratory system: Crackles in the base. Respiratory effort normal. Cardiovascular system: S1 & S2 heard, RRR. No JVD, murmurs, rubs, gallops or clicks. No pedal edema. Gastrointestinal system: Abdomen is nondistended, soft and nontender. No organomegaly or masses felt. Normal bowel sounds heard. Central nervous system: Alert and oriented. No focal neurological deficits. Extremities: Symmetric 5 x 5 power. Skin: No  rashes, lesions or ulcers Psychiatry: Judgement and insight appear normal. Mood & affect appropriate.     Data Reviewed: I have personally reviewed following labs and imaging studies  CBC: Recent Labs  Lab 08/09/21 1302  WBC 6.6  HGB 10.7*  HCT 36.7*  MCV 76.6*  PLT 676   Basic Metabolic Panel: Recent Labs  Lab 08/09/21 1302 08/10/21 0930 08/11/21 0547 08/12/21 0657 08/13/21 0508  NA 137 135 140 142 141  K 4.8 4.9 5.1 5.0 4.5  CL 98 96* 99 94* 91*  CO2 31 30 32 41* 42*  GLUCOSE 171* 204* 193* 94 216*  BUN 41* 40* 41* 44* 48*  CREATININE 2.28* 2.11* 1.89* 2.02* 2.05*  CALCIUM 8.9 8.5* 8.9 9.1 9.2  MG  --   --   --  1.9  --   PHOS  --   --   --  3.5  --    GFR: Estimated Creatinine Clearance: 41.2 mL/min (A) (by C-G formula based on SCr of 2.05 mg/dL (H)). Liver Function Tests: No results for input(s): AST, ALT, ALKPHOS, BILITOT, PROT, ALBUMIN in the last 168 hours. No results for input(s): LIPASE, AMYLASE in the last 168 hours. No results for input(s): AMMONIA in the last 168 hours. Coagulation Profile: No results for input(s): INR, PROTIME in the last 168 hours. Cardiac Enzymes: No results for input(s): CKTOTAL, CKMB, CKMBINDEX, TROPONINI in the last 168 hours. BNP (last 3 results) No results for input(s): PROBNP in the last 8760 hours. HbA1C: No results for input(s): HGBA1C in the last 72 hours. CBG: Recent Labs  Lab 08/12/21 1139 08/12/21 1640 08/12/21 2127 08/13/21 0813 08/13/21 1207  GLUCAP 126* 270* 280* 150* 207*   Lipid Profile: No results for input(s): CHOL, HDL, LDLCALC, TRIG, CHOLHDL, LDLDIRECT in the last 72 hours. Thyroid Function Tests: No results for input(s): TSH, T4TOTAL, FREET4, T3FREE, THYROIDAB in the last 72 hours. Anemia Panel: Recent Labs    08/12/21 0657 08/13/21 0508  FERRITIN 19* 25   Sepsis Labs: Recent Labs  Lab 08/09/21 1435 08/10/21 0601 08/11/21 0547  PROCALCITON <0.10 <0.10 <0.10    Recent Results (from the  past 240 hour(s))  Resp Panel by RT-PCR (Flu A&B, Covid) Nasopharyngeal Swab     Status: Abnormal   Collection Time: 08/09/21  3:12 PM   Specimen: Nasopharyngeal Swab; Nasopharyngeal(NP) swabs in vial transport medium  Result Value Ref Range Status   SARS Coronavirus 2 by RT PCR POSITIVE (A) NEGATIVE Final    Comment: (NOTE) SARS-CoV-2 target nucleic acids are DETECTED.  The SARS-CoV-2 RNA is generally detectable in upper respiratory specimens during the acute phase of infection. Positive results are indicative of the presence of the identified virus, but do not rule out bacterial infection or co-infection with other pathogens not detected by the test. Clinical correlation with patient history and other diagnostic information is necessary to determine patient infection status. The expected result is Negative.  Fact Sheet for Patients: EntrepreneurPulse.com.au  Fact Sheet for Healthcare  Providers: IncredibleEmployment.be  This test is not yet approved or cleared by the [place] and  has been authorized for detection and/or diagnosis of SARS-CoV-2 by FDA under an Emergency Use Authorization (EUA).  This EUA will remain in effect (meaning this test can be used) for the duration of  the COVID-19 declaration under Section 564(b)(1) of the A ct, 21 U.S.C. section 360bbb-3(b)(1), unless the authorization is terminated or revoked sooner.     Influenza A by PCR NEGATIVE NEGATIVE Final   Influenza B by PCR NEGATIVE NEGATIVE Final    Comment: (NOTE) The Xpert Xpress SARS-CoV-2/FLU/RSV plus assay is intended as an aid in the diagnosis of influenza from Nasopharyngeal swab specimens and should not be used as a sole basis for treatment. Nasal washings and aspirates are unacceptable for Xpert Xpress SARS-CoV-2/FLU/RSV testing.  Fact Sheet for Patients: EntrepreneurPulse.com.au  Fact Sheet for Healthcare  Providers: IncredibleEmployment.be  This test is not yet approved or cleared by the [place] FDA and has been authorized for detection and/or diagnosis of SARS-CoV-2 by FDA under an Emergency Use Authorization (EUA). This EUA will remain in effect (meaning this test can be used) for the duration of the COVID-19 declaration under Section 564(b)(1) of the Act, 21 U.S.C. section 360bbb-3(b)(1), unless the authorization is terminated or revoked.  Performed at [facility], [address], [place], [place] [address]          Radiology Studies: ECHOCARDIOGRAM COMPLETE  Result Date: 08/12/2021    ECHOCARDIOGRAM REPORT   Patient Name:   TYLON KEMMERLING [facility] Date of Exam: 08/12/2021 Medical Rec #:  [id number]            Height:       70.0 in Accession #:    [id number]           Weight:       251.3 lb Date of Birth:  Sep 05, 1950           BSA:          2.300 m Patient Age:    21 years             BP:           124/64 mmHg Patient Gender: M                    HR:           81 bpm. Exam Location:  [facility] Procedure: 2D Echo, Color Doppler and Cardiac Doppler Indications:     I50.31 congestive heart failure-Acute Diastolic  History:         Patient has prior history of Echocardiogram examinations.                  HFpEF, CKD, stage III; Risk Factors:Hypertension, Diabetes,                  Dyslipidemia and Current Smoker. Pt tested positive for                  COVID-19 on 08/09/21.  Sonographer:     Charmayne Sheer Referring Phys:  [id number] Sharen Hones Diagnosing Phys: Kathlyn Sacramento MD  Sonographer Comments: Suboptimal apical window and suboptimal subcostal window. IMPRESSIONS  1. Left ventricular ejection fraction, by estimation, is 55 to 60%. The left ventricle has normal function. The left ventricle has no regional wall motion abnormalities. Left ventricular diastolic parameters are indeterminate.  2. Right ventricular systolic function is normal. The right ventricular size is  normal. Tricuspid regurgitation signal is inadequate for assessing PA pressure.  3. Left atrial size was mildly dilated.  4. Right atrial size was mildly dilated.  5. The mitral valve is normal in structure. No evidence of mitral valve regurgitation. No evidence of mitral stenosis.  6. The aortic valve is calcified. Aortic valve regurgitation is not visualized. Aortic valve sclerosis/calcification is present, without any evidence of aortic stenosis. FINDINGS  Left Ventricle: Left ventricular ejection fraction, by estimation, is 55 to 60%. The left ventricle has normal function. The left ventricle has no regional wall motion abnormalities. The left ventricular internal cavity size was normal in size. There is  borderline left ventricular hypertrophy. Left ventricular diastolic parameters are indeterminate. Right Ventricle: The right ventricular size is normal. No increase in right ventricular wall thickness. Right ventricular systolic function is normal. Tricuspid regurgitation signal is inadequate for assessing PA pressure. Left Atrium: Left atrial size was mildly dilated. Right Atrium: Right atrial size was mildly dilated. Pericardium: There is no evidence of pericardial effusion. Mitral Valve: The mitral valve is normal in structure. No evidence of mitral valve regurgitation. No evidence of mitral valve stenosis. MV peak gradient, 3.9 mmHg. The mean mitral valve gradient is 1.0 mmHg. Tricuspid Valve: The tricuspid valve is normal in structure. Tricuspid valve regurgitation is not demonstrated. No evidence of tricuspid stenosis. Aortic Valve: The aortic valve is calcified. Aortic valve regurgitation is not visualized. Aortic valve sclerosis/calcification is present, without any evidence of aortic stenosis. Aortic valve mean gradient measures 6.0 mmHg. Aortic valve peak gradient measures 11.8 mmHg. Aortic valve area, by VTI measures 2.16 cm. Pulmonic Valve: The pulmonic valve was normal in structure. Pulmonic valve  regurgitation is not visualized. No evidence of pulmonic stenosis. Aorta: The aortic root is normal in size and structure. Venous: The inferior vena cava was not well visualized. IAS/Shunts: No atrial level shunt detected by color flow Doppler.  LEFT VENTRICLE PLAX 2D LVIDd:         5.19 cm   Diastology LVIDs:         3.94 cm   LV e' medial:    7.07 cm/s LV PW:         1.11 cm   LV E/e' medial:  15.7 LV IVS:        1.01 cm   LV e' lateral:   11.90 cm/s LVOT diam:     2.20 cm   LV E/e' lateral: 9.3 LV SV:         73 LV SV Index:   32 LVOT Area:     3.80 cm  RIGHT VENTRICLE RV Basal diam:  4.15 cm LEFT ATRIUM           Index        RIGHT ATRIUM           Index LA diam:      5.40 cm 2.35 cm/m   RA Area:     22.60 cm LA Vol (A2C): 49.6 ml 21.56 ml/m  RA Volume:   68.70 ml  29.87 ml/m  AORTIC VALVE                     PULMONIC VALVE AV Area (Vmax):    1.98 cm      PV Vmax:       0.99 m/s AV Area (Vmean):   1.85 cm      PV Vmean:      70.100 cm/s AV Area (VTI):  2.16 cm      PV VTI:        0.180 m AV Vmax:           172.00 cm/s   PV Peak grad:  3.9 mmHg AV Vmean:          117.000 cm/s  PV Mean grad:  2.0 mmHg AV VTI:            0.336 m AV Peak Grad:      11.8 mmHg AV Mean Grad:      6.0 mmHg LVOT Vmax:         89.40 cm/s LVOT Vmean:        57.000 cm/s LVOT VTI:          0.191 m LVOT/AV VTI ratio: 0.57  AORTA Ao Root diam: 3.20 cm MITRAL VALVE MV Area (PHT): 4.10 cm     SHUNTS MV Area VTI:   2.67 cm     Systemic VTI:  0.19 m MV Peak grad:  3.9 mmHg     Systemic Diam: 2.20 cm MV Mean grad:  1.0 mmHg MV Vmax:       0.99 m/s MV Vmean:      47.6 cm/s MV Decel Time: 185 msec MV E velocity: 111.00 cm/s MV A velocity: 63.00 cm/s MV E/A ratio:  1.76 Kathlyn Sacramento MD Electronically signed by Kathlyn Sacramento MD Signature Date/Time: 08/12/2021/10:41:58 AM    Final         Scheduled Meds:  albuterol  2 puff Inhalation Q6H   vitamin C  500 mg Oral Daily   atorvastatin  80 mg Oral Daily   clopidogrel  75 mg Oral  Daily   dexamethasone  6 mg Oral Daily   enoxaparin (LOVENOX) injection  0.5 mg/kg Subcutaneous Q24H   fenofibrate  160 mg Oral Daily   furosemide  80 mg Intravenous BID   insulin aspart  0-20 Units Subcutaneous TID WC   insulin detemir  20 Units Subcutaneous Daily   iron polysaccharides  150 mg Oral Daily   metoprolol tartrate  25 mg Oral BID   nicotine  14 mg Transdermal Daily   pantoprazole  40 mg Oral QAC breakfast   zinc sulfate  220 mg Oral Daily   Continuous Infusions:   LOS: 4 days    Time spent: 28 minutes    Sharen Hones, MD [facility]   To contact the attending provider between 7A-7P or the covering provider during after hours 7P-7A, please log into the web site [email or web address] and access using universal [place] password for that web site. If you do not have the password, please call the hospital operator.  08/13/2021, 1:07 PM

## 2021-08-13 NOTE — Progress Notes (Signed)
Patient is signing out AMA.  Patient is currently on 4L of O2 but refuses to leave with oxygen.  Patient aware of risks and has talked to Dr. Roosevelt Locks

## 2021-08-13 NOTE — Plan of Care (Signed)
Patient left against medical advice.  Dr. Roosevelt Locks discussed with patient.  Patient was on O2 during hospital stay but declined to use O2 at time of discharge.  Discussed safety concerns with patient.

## 2021-08-13 NOTE — Discharge Summary (Signed)
Patient is leaving hospital Joshua Roy.  I have just explained to him that he is not ready for discharge, there is a possibility of death if he leaves the hospital prematurely.  However, patient states that he is going to taking care of his wife as his wife is a sicker.  He is not going to stay. Patient currently is alert oriented to time place and person.  He knows current events.  There is no reason for me to believe that patient lacks decision making capacity. I have not prescribed steroids, increased dose of oral Lasix.  Also iron pills.  Patient will need to follow-up with PCP and cardiology in 1 week.  Please note the hospital course is documented in a separate progress note today

## 2021-08-13 NOTE — Progress Notes (Signed)
Progress Note  Patient Name: Joshua Roy Date of Encounter: 08/13/2021  [facility] Cardiologist: Esmond Plants, MD PhD  Subjective   Patient reports that he feels back to baseline.  No shortness of breath, chest pain, palpitations, or edema.  He demands to go home today.  Inpatient Medications    Scheduled Meds:  albuterol  2 puff Inhalation Q6H   vitamin C  500 mg Oral Daily   atorvastatin  80 mg Oral Daily   clopidogrel  75 mg Oral Daily   dexamethasone  6 mg Oral Daily   enoxaparin (LOVENOX) injection  0.5 mg/kg Subcutaneous Q24H   fenofibrate  160 mg Oral Daily   furosemide  60 mg Intravenous Q12H   insulin aspart  0-20 Units Subcutaneous TID WC   insulin detemir  20 Units Subcutaneous Daily   iron polysaccharides  150 mg Oral Daily   metoprolol tartrate  25 mg Oral BID   nicotine  14 mg Transdermal Daily   pantoprazole  40 mg Oral QAC breakfast   zinc sulfate  220 mg Oral Daily   Continuous Infusions:  remdesivir 100 mg in NS 100 mL 100 mg (08/12/21 0931)   PRN Meds: acetaminophen, ALPRAZolam, chlorpheniramine-HYDROcodone, hydrALAZINE, nitroGLYCERIN, ondansetron **OR** ondansetron (ZOFRAN) IV   Vital Signs    Vitals:   08/12/21 0754 08/12/21 1939 08/13/21 0330 08/13/21 0818  BP: 124/64 116/68 116/68 112/62  Pulse: (!) 59 79 64 63  Resp: 18 20 20 18   Temp: 97.9 F (36.6 C) 97.8 F (36.6 C) 97.7 F (36.5 C) 97.8 F (36.6 C)  TempSrc: Oral Oral Oral Oral  SpO2: 95% 92% 91% 97%  Weight:      Height:        Intake/Output Summary (Last 24 hours) at 08/13/2021 0846 Last data filed at 08/13/2021 0541 Gross per 24 hour  Intake --  Output 2500 ml  Net -2500 ml   Last 3 Weights 08/09/2021 06/12/2021 05/28/2021  Weight (lbs) 251 lb 5.2 oz 251 lb 6.4 oz 250 lb 11.2 oz  Weight (kg) 114 kg 114.034 kg 113.717 kg      Telemetry    Sinus rhythm with PVCs- Personally Reviewed  ECG    No new tracing.  Physical Exam   GEN: No acute distress.    Neck: JVP approximately 8 cm with positive HJR. Cardiac: RRR, no murmurs, rubs, or gallops.  Respiratory: Coarse breath sounds with faint bibasilar crackles. GI: Soft, nontender, non-distended  MS: 2+ pitting edema to the proximal calves; No deformity. Neuro:  Nonfocal  Psych: Normal affect   Labs    High Sensitivity Troponin:   Recent Labs  Lab 08/09/21 1322 08/09/21 2023  TROPONINIHS 15 14     Chemistry Recent Labs  Lab 08/11/21 0547 08/12/21 0657 08/13/21 0508  NA 140 142 141  K 5.1 5.0 4.5  CL 99 94* 91*  CO2 32 41* 42*  GLUCOSE 193* 94 216*  BUN 41* 44* 48*  CREATININE 1.89* 2.02* 2.05*  CALCIUM 8.9 9.1 9.2  MG  --  1.9  --   GFRNONAA 37* 34* 34*  ANIONGAP 9 7 8     Lipids No results for input(s): CHOL, TRIG, HDL, LABVLDL, LDLCALC, CHOLHDL in the last 168 hours.  Hematology Recent Labs  Lab 08/09/21 1302  WBC 6.6  RBC 4.79  HGB 10.7*  HCT 36.7*  MCV 76.6*  MCH 22.3*  MCHC 29.2*  RDW 18.3*  PLT 373   Thyroid No results for input(s): TSH,  FREET4 in the last 168 hours.  BNP Recent Labs  Lab 08/09/21 1430 08/11/21 0547  BNP 428.4* 813.7*    DDimer  Recent Labs  Lab 08/11/21 0547 08/12/21 0657 08/13/21 0508  DDIMER 1.25* 1.25* 0.90*     Radiology    ECHOCARDIOGRAM COMPLETE  Result Date: 08/12/2021    ECHOCARDIOGRAM REPORT   Patient Name:   [facility] Date of Exam: 08/12/2021 Medical Rec #:  [id number]            Height:       70.0 in Accession #:    [id number]           Weight:       251.3 lb Date of Birth:  19-Aug-1950           BSA:          2.300 m Patient Age:    71 years             BP:           124/64 mmHg Patient Gender: M                    HR:           81 bpm. Exam Location:  [facility] Procedure: 2D Echo, Color Doppler and Cardiac Doppler Indications:     I50.31 congestive heart failure-Acute Diastolic  History:         Patient has prior history of Echocardiogram examinations.                  HFpEF, CKD, stage III; Risk  Factors:Hypertension, Diabetes,                  Dyslipidemia and Current Smoker. Pt tested positive for                  COVID-19 on 08/09/21.  Sonographer:     Charmayne Sheer Referring Phys:  [id number] Sharen Hones Diagnosing Phys: Kathlyn Sacramento MD  Sonographer Comments: Suboptimal apical window and suboptimal subcostal window. IMPRESSIONS  1. Left ventricular ejection fraction, by estimation, is 55 to 60%. The left ventricle has normal function. The left ventricle has no regional wall motion abnormalities. Left ventricular diastolic parameters are indeterminate.  2. Right ventricular systolic function is normal. The right ventricular size is normal. Tricuspid regurgitation signal is inadequate for assessing PA pressure.  3. Left atrial size was mildly dilated.  4. Right atrial size was mildly dilated.  5. The mitral valve is normal in structure. No evidence of mitral valve regurgitation. No evidence of mitral stenosis.  6. The aortic valve is calcified. Aortic valve regurgitation is not visualized. Aortic valve sclerosis/calcification is present, without any evidence of aortic stenosis. FINDINGS  Left Ventricle: Left ventricular ejection fraction, by estimation, is 55 to 60%. The left ventricle has normal function. The left ventricle has no regional wall motion abnormalities. The left ventricular internal cavity size was normal in size. There is  borderline left ventricular hypertrophy. Left ventricular diastolic parameters are indeterminate. Right Ventricle: The right ventricular size is normal. No increase in right ventricular wall thickness. Right ventricular systolic function is normal. Tricuspid regurgitation signal is inadequate for assessing PA pressure. Left Atrium: Left atrial size was mildly dilated. Right Atrium: Right atrial size was mildly dilated. Pericardium: There is no evidence of pericardial effusion. Mitral Valve: The mitral valve is normal in structure. No evidence of mitral valve regurgitation. No  evidence of mitral valve stenosis. MV  peak gradient, 3.9 mmHg. The mean mitral valve gradient is 1.0 mmHg. Tricuspid Valve: The tricuspid valve is normal in structure. Tricuspid valve regurgitation is not demonstrated. No evidence of tricuspid stenosis. Aortic Valve: The aortic valve is calcified. Aortic valve regurgitation is not visualized. Aortic valve sclerosis/calcification is present, without any evidence of aortic stenosis. Aortic valve mean gradient measures 6.0 mmHg. Aortic valve peak gradient measures 11.8 mmHg. Aortic valve area, by VTI measures 2.16 cm. Pulmonic Valve: The pulmonic valve was normal in structure. Pulmonic valve regurgitation is not visualized. No evidence of pulmonic stenosis. Aorta: The aortic root is normal in size and structure. Venous: The inferior vena cava was not well visualized. IAS/Shunts: No atrial level shunt detected by color flow Doppler.  LEFT VENTRICLE PLAX 2D LVIDd:         5.19 cm   Diastology LVIDs:         3.94 cm   LV e' medial:    7.07 cm/s LV PW:         1.11 cm   LV E/e' medial:  15.7 LV IVS:        1.01 cm   LV e' lateral:   11.90 cm/s LVOT diam:     2.20 cm   LV E/e' lateral: 9.3 LV SV:         73 LV SV Index:   32 LVOT Area:     3.80 cm  RIGHT VENTRICLE RV Basal diam:  4.15 cm LEFT ATRIUM           Index        RIGHT ATRIUM           Index LA diam:      5.40 cm 2.35 cm/m   RA Area:     22.60 cm LA Vol (A2C): 49.6 ml 21.56 ml/m  RA Volume:   68.70 ml  29.87 ml/m  AORTIC VALVE                     PULMONIC VALVE AV Area (Vmax):    1.98 cm      PV Vmax:       0.99 m/s AV Area (Vmean):   1.85 cm      PV Vmean:      70.100 cm/s AV Area (VTI):     2.16 cm      PV VTI:        0.180 m AV Vmax:           172.00 cm/s   PV Peak grad:  3.9 mmHg AV Vmean:          117.000 cm/s  PV Mean grad:  2.0 mmHg AV VTI:            0.336 m AV Peak Grad:      11.8 mmHg AV Mean Grad:      6.0 mmHg LVOT Vmax:         89.40 cm/s LVOT Vmean:        57.000 cm/s LVOT VTI:          0.191  m LVOT/AV VTI ratio: 0.57  AORTA Ao Root diam: 3.20 cm MITRAL VALVE MV Area (PHT): 4.10 cm     SHUNTS MV Area VTI:   2.67 cm     Systemic VTI:  0.19 m MV Peak grad:  3.9 mmHg     Systemic Diam: 2.20 cm MV Mean grad:  1.0 mmHg MV Vmax:  0.99 m/s MV Vmean:      47.6 cm/s MV Decel Time: 185 msec MV E velocity: 111.00 cm/s MV A velocity: 63.00 cm/s MV E/A ratio:  1.76 Kathlyn Sacramento MD Electronically signed by Kathlyn Sacramento MD Signature Date/Time: 08/12/2021/10:41:58 AM    Final     Cardiac Studies   TTE (08/12/2021):  1. Left ventricular ejection fraction, by estimation, is 55 to 60%. The  left ventricle has normal function. The left ventricle has no regional  wall motion abnormalities. Left ventricular diastolic parameters are  indeterminate.   2. Right ventricular systolic function is normal. The right ventricular  size is normal. Tricuspid regurgitation signal is inadequate for assessing  PA pressure.   3. Left atrial size was mildly dilated.   4. Right atrial size was mildly dilated.   5. The mitral valve is normal in structure. No evidence of mitral valve  regurgitation. No evidence of mitral stenosis.   6. The aortic valve is calcified. Aortic valve regurgitation is not  visualized. Aortic valve sclerosis/calcification is present, without any  evidence of aortic stenosis.   Patient Profile     71 y.o. male with history of coronary artery disease status post CABG in 09/2019, carotid artery disease status post right CEA (09/2019), HFpEF, postoperative atrial fibrillation, hypertension, hyperlipidemia, type 2 diabetes mellitus, tobacco abuse, COPD on chronic nocturnal oxygen, chronic kidney disease stage III, and obesity.  Assessment & Plan    Acute on chronic respiratory failure with hypoxia: Likely due to a combination of COVID-19 and acute on chronic HFpEF.  Patient still on 4L of supplemental O2. - Continue diuresis, as below. - Tx of COVID-19 per primary team.  Acute on  chronic HFpEF: Echo yesterday with normal LVEF but indeterminate diastolic parameters.  Patient still appears significantly volume overloaded, though he states that he is back to baseline.  He attributes fluid retention to missing furosemide when he first presented to the ED last week.  Renal function stable. - Continue diuresis; will escalate IV furosemide to 80 mg BID. - If patient leaves AMA, I would recommend switching home diuretic regimen to furosemide 80 mg PO BID, with outpatient follow-up in 1-2 weeks.  CAD: No angina reported.  No further w/u at this time. - Continue clopidogrel, atorvastatin, and fenofibrate.  Disposition: I recommend continued inpatient management with IV diuresis, as above, as patient still has exam findings consistent with acute on chronic HFpEF as well as increased O2 requirement from baseline.  I have discussed my findings and recommendations with the patient at length; he has poor insight into his medical conditions.  Dr. Roosevelt Locks and I have reviewed his case and agree that more inpatient treatment is indicated and the patient would need to leave against medical advice if he demands discharge.  For questions or updates, please contact [place] Please consult [email or web address] for contact info under [facility] Cardiology.     Signed, Nelva Bush, MD  08/13/2021, 8:46 AM

## 2021-08-13 NOTE — Progress Notes (Addendum)
Inpatient Diabetes Program Recommendations  AACE/ADA: New Consensus Statement on Inpatient Glycemic Control   Target Ranges:  Prepandial:   less than 140 mg/dL      Peak postprandial:   less than 180 mg/dL (1-2 hours)      Critically ill patients:  140 - 180 mg/dL    Latest Reference Range & Units 08/12/21 07:51 08/12/21 11:39 08/12/21 16:40 08/12/21 21:27 08/13/21 08:13  Glucose-Capillary 70 - 99 mg/dL 74 126 (H) 270 (H) 280 (H) 150 (H)   Review of Glycemic Control  Diabetes history: DM2 Outpatient Diabetes medications: Amaryl 1 mg QAM, Metformin 1000 mg BID Current orders for Inpatient glycemic control: Levemir 20 units daily, Novolog 0-20 units TID with meals; Decadron 6 mg daily  Inpatient Diabetes Program Recommendations:    Insulin: Noted patient was ordered Levemir 20 units BID (given BID on 08/11/21) and no Levemir given on 08/12/21. Levemir 20 units daily ordered on 08/12/21 to start today. Fasting glucose 150 mg/dl this morning. Please consider ordering Novolog 0-5 units QHS for bedtime correction if needed.  Thanks, Barnie Alderman, RN, MSN, CDE Diabetes Coordinator Inpatient Diabetes Program [phone] (Team Pager from 8am to 5pm)

## 2021-08-16 ENCOUNTER — Other Ambulatory Visit: Payer: Self-pay | Admitting: Internal Medicine

## 2021-08-16 ENCOUNTER — Other Ambulatory Visit: Payer: Self-pay | Admitting: Cardiovascular Disease

## 2021-08-22 DIAGNOSIS — E1122 Type 2 diabetes mellitus with diabetic chronic kidney disease: Secondary | ICD-10-CM | POA: Diagnosis not present

## 2021-08-22 DIAGNOSIS — I10 Essential (primary) hypertension: Secondary | ICD-10-CM | POA: Diagnosis not present

## 2021-08-22 DIAGNOSIS — N1832 Chronic kidney disease, stage 3b: Secondary | ICD-10-CM | POA: Diagnosis not present

## 2021-08-22 DIAGNOSIS — N2581 Secondary hyperparathyroidism of renal origin: Secondary | ICD-10-CM | POA: Diagnosis not present

## 2021-08-31 ENCOUNTER — Other Ambulatory Visit: Payer: Self-pay | Admitting: Internal Medicine

## 2021-09-03 DIAGNOSIS — M25511 Pain in right shoulder: Secondary | ICD-10-CM | POA: Diagnosis not present

## 2021-09-03 DIAGNOSIS — E119 Type 2 diabetes mellitus without complications: Secondary | ICD-10-CM | POA: Diagnosis not present

## 2021-09-09 ENCOUNTER — Other Ambulatory Visit: Payer: Self-pay

## 2021-09-17 ENCOUNTER — Telehealth: Payer: Self-pay | Admitting: Cardiovascular Disease

## 2021-09-17 DIAGNOSIS — I10 Essential (primary) hypertension: Secondary | ICD-10-CM | POA: Diagnosis not present

## 2021-09-17 DIAGNOSIS — N2581 Secondary hyperparathyroidism of renal origin: Secondary | ICD-10-CM | POA: Diagnosis not present

## 2021-09-17 DIAGNOSIS — M7541 Impingement syndrome of right shoulder: Secondary | ICD-10-CM | POA: Diagnosis not present

## 2021-09-17 DIAGNOSIS — E1122 Type 2 diabetes mellitus with diabetic chronic kidney disease: Secondary | ICD-10-CM | POA: Diagnosis not present

## 2021-09-17 DIAGNOSIS — N1832 Chronic kidney disease, stage 3b: Secondary | ICD-10-CM | POA: Diagnosis not present

## 2021-09-17 NOTE — Telephone Encounter (Signed)
Patient states Dr. Holley Raring did bloodwork and his potassium came back elevated, and patient asks if he still needs to be taking  Spironolactone. Please call to discuss.

## 2021-09-17 NOTE — Telephone Encounter (Signed)
Return call to pt, advised his K+ of 6.0 was almost a month ago (08/22/2021), would advised to call Dr. Holley Raring office as he is nephrology and see if he is wanting repeat labs and he can also manage his spironolactone as he specializes in his kidneys. Joshua Roy verbalized understanding, thankful for returning his call quickly.

## 2021-10-01 ENCOUNTER — Other Ambulatory Visit: Payer: Self-pay | Admitting: *Deleted

## 2021-10-01 DIAGNOSIS — E1142 Type 2 diabetes mellitus with diabetic polyneuropathy: Secondary | ICD-10-CM

## 2021-10-01 MED ORDER — FENOFIBRATE 160 MG PO TABS: 160.0000 mg | ORAL_TABLET | Freq: Every day | ORAL | 3 refills | Status: DC

## 2021-10-01 MED ORDER — METOPROLOL TARTRATE 25 MG PO TABS: 25.0000 mg | ORAL_TABLET | Freq: Two times a day (BID) | ORAL | 3 refills | Status: DC

## 2021-10-16 ENCOUNTER — Other Ambulatory Visit: Payer: Self-pay

## 2021-12-20 ENCOUNTER — Encounter: Payer: Self-pay | Admitting: Nurse Practitioner

## 2021-12-20 ENCOUNTER — Other Ambulatory Visit: Payer: Self-pay | Admitting: Nurse Practitioner

## 2021-12-20 ENCOUNTER — Ambulatory Visit (INDEPENDENT_AMBULATORY_CARE_PROVIDER_SITE_OTHER): Payer: Medicare Other | Admitting: Nurse Practitioner

## 2021-12-20 VITALS — BP 125/61 | HR 63 | Ht 70.0 in | Wt 252.6 lb

## 2021-12-20 DIAGNOSIS — E1169 Type 2 diabetes mellitus with other specified complication: Secondary | ICD-10-CM | POA: Diagnosis not present

## 2021-12-20 DIAGNOSIS — E119 Type 2 diabetes mellitus without complications: Secondary | ICD-10-CM

## 2021-12-20 DIAGNOSIS — M545 Low back pain, unspecified: Secondary | ICD-10-CM | POA: Diagnosis not present

## 2021-12-20 DIAGNOSIS — I10 Essential (primary) hypertension: Secondary | ICD-10-CM | POA: Diagnosis not present

## 2021-12-20 DIAGNOSIS — M25472 Effusion, left ankle: Secondary | ICD-10-CM

## 2021-12-20 DIAGNOSIS — M25471 Effusion, right ankle: Secondary | ICD-10-CM | POA: Diagnosis not present

## 2021-12-20 DIAGNOSIS — F419 Anxiety disorder, unspecified: Secondary | ICD-10-CM

## 2021-12-20 LAB — POCT GLYCOSYLATED HEMOGLOBIN (HGB A1C): Hemoglobin A1C: 7 % — AB (ref 4.0–5.6)

## 2021-12-20 MED ORDER — MELOXICAM 7.5 MG PO TABS: 7.5000 mg | ORAL_TABLET | Freq: Every day | ORAL | 0 refills | Status: DC

## 2021-12-20 MED ORDER — ALPRAZOLAM 0.25 MG PO TABS: 0.2500 mg | ORAL_TABLET | Freq: Two times a day (BID) | ORAL | 0 refills | Status: DC | PRN

## 2021-12-20 NOTE — Progress Notes (Unsigned)
Established Patient Office Visit  Subjective:  Patient ID: Joshua Roy, male    DOB: 1951-01-30  Age: 71 y.o. MRN: [id number]  CC:  Chief Complaint  Patient presents with   Medication Refill    Patient needs refill on his xanax   Edema    Patient complains of left foot swelling over the past week.   Back Pain    Lower back pain for the 4 days, patient was doing strenuous outdoor work.Marland Kitchen     HPI  Joshua Roy presents for:  HPI   Past Medical History:  Diagnosis Date   (HFpEF) heart failure with preserved ejection fraction ([place])    a. 07/2019 Echo: EF 60-65%. Mod LVH. Gr1 DD. No rwma. Nl RV size/fxn. Mildly dil LA; b. 11/2019 Echo: EF 55-60%, no rwma, mild LVH. Nl RV size/fxn. Mod dil LA.   ACE-inhibitor cough    Anxiety    Bronchitis 06/27/2016   Carotid arterial disease ([place])    a. 09/2019 Carotid U/S: RICA 84-69%, LICA 6-29%; b. 11/2839 R CEA.   CKD (chronic kidney disease), stage III (HCC)    COPD (chronic obstructive pulmonary disease) (HCC)    cath, mild inf. hypokinesis EF 49%, 100% ROA   Coronary artery disease    a. 2001 Cath: LM 10, LAD 60p/m, D1 30, LCX 20p, RCA 114md-->Med Rx; b. 05/2017 Ex Mv: Ex time 5:46, antlat twi @ rest, fixed infarct w/ mild peri-infarct ischemia. EF 38%; c. 08/2019 Cath: LM 40ost, 70d, LAD 780mLCX 8047mCA 80-90p, 100m29m 09/2019 CABG x4: LIMA->LAD, VG->RI, VG->OM, VG->RPDA.   Diabetes mellitus type II    Hyperlipidemia    Hypertension    [facility]    knee OA, injected 2012 by ortho   PAF (paroxysmal atrial fibrillation) (HCC)Angels a. 09/2019 post-op CABG-->Amio.   Pneumonia    PSVT (paroxysmal supraventricular tachycardia) (HCC)Perkins a. 08/2019 Zio: 189 brief runs of SVT (fastest 174 x 6 beats, longest 15 beats @ 107).   Tobacco abuse     Past Surgical History:  Procedure Laterality Date   CARDIAC CATHETERIZATION  05/06/2000   [facility]/A  04/20/2018   Procedure: COLONOSCOPY WITH PROPOFOL;  Surgeon: WohlLucilla Lame;  [facility];  Service: Endoscopy;  Laterality: N/A;   CORONARY ARTERY BYPASS GRAFT N/A 10/07/2019   Procedure: CORONARY ARTERY BYPASS GRAFTING (CABG) using LIMA to LAD; Endoscopic harvesting of left greater saphenous vein: SVG to RAMUS; SVG to OM1; SVG to PDA.;  Surgeon: BartGaye Pollack;  [facility]  Service: Open Heart Surgery;  Laterality: N/A;   ENDARTERECTOMY Right 10/07/2019   Procedure: ENDARTERECTOMY CAROTID;  Surgeon: EarlRosetta Posner;  [facility]  Service: Vascular;  Laterality: Right;   ENDOVEIN HARVEST OF GREATER SAPHENOUS VEIN Left 10/07/2019   Procedure: EndoCharleston RopesGreater Saphenous Vein;  Surgeon: BartGaye Pollack;  [facility]  Service: Open Heart Surgery;  Laterality: Left;   ESOPHAGOGASTRODUODENOSCOPY ENDOSCOPY     KNEE ARTHROSCOPY  07/25/04   left   KNEEShell [place]ight, open surgery- repair   RIGHT/LEFT HEART CATH AND CORONARY ANGIOGRAPHY Bilateral 09/08/2019   Procedure: RIGHT/LEFT HEART CATH AND CORONARY ANGIOGRAPHY;  Surgeon: GollMinna Merritts;  [facility];  Service: Cardiovascular;  Laterality: Bilateral;   TEE WITHOUT CARDIOVERSION N/A 10/07/2019  Procedure: TRANSESOPHAGEAL ECHOCARDIOGRAM (TEE);  Surgeon: Gaye Pollack, MD;  Location: [facility];  Service: Open Heart Surgery;  Laterality: N/A;   TOTAL KNEE ARTHROPLASTY Left 07/14/2016   Procedure: LEFT TOTAL KNEE ARTHROPLASTY;  Surgeon: Gaynelle Arabian, MD;  Location[facility];  Service: Orthopedics;  Laterality: Left;   TOTAL KNEE ARTHROPLASTY Right 07/13/2017   Procedure: RIGHT TOTAL KNEE ARTHROPLASTY;  Surgeon: Gaynelle Arabian, MD;  Location: [facility];  Service: Orthopedics;  Laterality: Right;    Family History  Problem Relation Age of Onset   Heart disease Father        CAD, MI x 3   Hypertension Father    Alcohol abuse Father    Diabetes Father    Diabetes Sister     Cancer Brother        lymphoma, in remission   Colon cancer Brother    Heart disease Sister        CABG x 3   Prostate cancer Neg Hx     Social History   Socioeconomic History   Marital status: Married    Spouse name: Janie   Number of children: 3   Years of education: Not on file   Highest education level: Not on file  Occupational History   Occupation: Engineer, manufacturing, Estate agent  Tobacco Use   Smoking status: Every Day    Packs/day: 1.50    Years: 52.00    Pack years: 78.00    Types: Cigarettes   Smokeless tobacco: Never   Tobacco comments:    per patient wears a patch and has cut down on cigarettes/day (10/05/2019)  Vaping Use   Vaping Use: Never used  Substance and Sexual Activity   Alcohol use: Yes    Alcohol/week: 0.0 standard drinks    Comment: 5-6 cocktails, 1 times a week   Drug use: No   Sexual activity: Not on file  Other Topics Concern   Not on file  Social History Narrative   From [place].  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   Social Determinants of Health   Financial Resource Strain: Low Risk    Difficulty of Paying Living Expenses: Not hard at all  Food Insecurity: Not on file  Transportation Needs: No Transportation Needs   Lack of Transportation (Medical): No   Lack of Transportation (Non-Medical): No  Physical Activity: Insufficiently Active   Days of Exercise per Week: 2 days   Minutes of Exercise per Session: 20 min  Stress: No Stress Concern Present   Feeling of Stress : Not at all  Social Connections: Moderately Isolated   Frequency of Communication with Friends and Family: More than three times a week   Frequency of Social Gatherings with Friends and Family: More than three times a week   Attends Religious Services: Never   Marine scientist or Organizations: No   Attends Music therapist: Never   Marital Status: Married  Human resources officer Violence: Not At Risk   Fear of Current or Ex-Partner: No    Emotionally Abused: No   Physically Abused: No   Sexually Abused: No     Outpatient Medications Prior to Visit  Medication Sig Dispense Refill   atorvastatin (LIPITOR) 80 MG tablet Take 1 tablet (80 mg total) by mouth daily. 90 tablet 3   clopidogrel (PLAVIX) 75 MG tablet Take 1 tablet (75 mg total) by mouth daily. 90 tablet 3   fenofibrate 160 MG tablet Take 1 tablet (160 mg total)  by mouth daily. 90 tablet 3   furosemide (LASIX) 40 MG tablet Take 2 tablets (80 mg total) by mouth 2 (two) times daily. 180 tablet 3   glimepiride (AMARYL) 2 MG tablet TAKE 1/2 TABLET BY MOUTH IN THE MORNING (Patient taking differently: Take 1 mg by mouth daily with breakfast.) 45 tablet 7   glucose blood (TRUE METRIX BLOOD GLUCOSE TEST) test strip 1 each by Other route daily. Use as instructed 100 each 12   iron polysaccharides (NIFEREX) 150 MG capsule Take 1 capsule (150 mg total) by mouth daily. 30 capsule 0   metFORMIN (GLUCOPHAGE) 1000 MG tablet TAKE 1 TABLET BY MOUTH TWICE A DAY (Patient taking differently: Take 1,000 mg by mouth 2 (two) times daily with a meal.) 180 tablet 6   metoprolol tartrate (LOPRESSOR) 25 MG tablet Take 1 tablet (25 mg total) by mouth 2 (two) times daily. 180 tablet 3   nitroGLYCERIN (NITROSTAT) 0.4 MG SL tablet Place 0.4 mg under the tongue every 5 (five) minutes as needed for chest pain.     pantoprazole (PROTONIX) 40 MG tablet TAKE 1 TABLET BY MOUTH DAILY BEFORE BREAKFAST 90 tablet 3   sildenafil (REVATIO) 20 MG tablet TAKE ONE TABLET BY MOUTH DAILY AS NEEDED 30 tablet 6   traZODone (DESYREL) 50 MG tablet TAKE 1 TABLET BY MOUTH EVERY DAY 90 tablet 1   ALPRAZolam (XANAX) 0.25 MG tablet Take 1 tablet (0.25 mg total) by mouth 2 (two) times daily as needed for anxiety. 30 tablet 0   spironolactone (ALDACTONE) 25 MG tablet Take 1 tablet (25 mg total) by mouth daily. (Patient not taking: Reported on 12/20/2021) 90 tablet 1   No facility-administered medications prior to visit.     Allergies  Allergen Reactions   Ramipril Cough and Other (See Comments)    REACTION: Cough   Pseudoephedrine-Guaifenesin Er Other (See Comments) and Hypertension    Elevated BP, insomnia Elevated BP, insomnia    ROS Review of Systems    Objective:    Physical Exam  BP 125/61   Pulse 63   Ht 5' 10"  (1.778 m)   Wt 252 lb 9.6 oz (114.6 kg)   BMI 36.24 kg/m  Wt Readings from Last 3 Encounters:  12/20/21 252 lb 9.6 oz (114.6 kg)  08/09/21 251 lb 5.2 oz (114 kg)  06/12/21 251 lb 6.4 oz (114 kg)     Health Maintenance Due  Topic Date Due   COVID-19 Vaccine (1) Never done   Zoster Vaccines- Shingrix (1 of 2) Never done   OPHTHALMOLOGY EXAM  10/26/2021   HEMOGLOBIN A1C  12/11/2021    There are no preventive care reminders to display for this patient.  Lab Results  Component Value Date   TSH 1.66 06/12/2020   Lab Results  Component Value Date   WBC 6.6 08/09/2021   HGB 10.7 (L) 08/09/2021   HCT 36.7 (L) 08/09/2021   MCV 76.6 (L) 08/09/2021   PLT 373 08/09/2021   Lab Results  Component Value Date   NA 141 08/13/2021   K 4.5 08/13/2021   CO2 42 (H) 08/13/2021   GLUCOSE 216 (H) 08/13/2021   BUN 48 (H) 08/13/2021   CREATININE 2.05 (H) 08/13/2021   BILITOT 0.3 06/13/2021   ALKPHOS 25 (L) 12/07/2019   AST 14 06/13/2021   ALT 12 06/13/2021   PROT 6.3 06/13/2021   ALBUMIN 3.8 12/07/2019   CALCIUM 9.2 08/13/2021   ANIONGAP 8 08/13/2021   EGFR 30 (L) 06/13/2021  GFR 65.78 11/15/2014   Lab Results  Component Value Date   CHOL 124 06/13/2021   Lab Results  Component Value Date   HDL 39 (L) 06/13/2021   Lab Results  Component Value Date   LDLCALC 63 06/13/2021   Lab Results  Component Value Date   TRIG 138 06/13/2021   Lab Results  Component Value Date   CHOLHDL 3.2 06/13/2021   Lab Results  Component Value Date   HGBA1C 7.3 (H) 06/13/2021      Assessment & Plan:   Problem List Items Addressed This Visit       Other   Anxiety    Relevant Medications   ALPRAZolam (XANAX) 0.25 MG tablet     Meds ordered this encounter  Medications   ALPRAZolam (XANAX) 0.25 MG tablet    Sig: Take 1 tablet (0.25 mg total) by mouth 2 (two) times daily as needed for anxiety.    Dispense:  60 tablet    Refill:  0     Follow-up: No follow-ups on file.    Theresia Lo, NP

## 2021-12-22 DIAGNOSIS — M25471 Effusion, right ankle: Secondary | ICD-10-CM | POA: Insufficient documentation

## 2021-12-22 DIAGNOSIS — M545 Low back pain, unspecified: Secondary | ICD-10-CM | POA: Insufficient documentation

## 2021-12-22 NOTE — Assessment & Plan Note (Signed)
HgA1c 7.0 in the office today. Advised patient to watch diet and follow a continuous exercise schedule. Continue the current medication.

## 2021-12-22 NOTE — Assessment & Plan Note (Signed)
Advised patient to apply alternate hot and cold pack. Started patient on Flexeril 5 mg as needed.

## 2021-12-22 NOTE — Assessment & Plan Note (Signed)
Patient has bilateral ankle edema left more than right foot. Advised patient to continue taking spironolactone. Patient has a visit with the nephrologist on Tuesday advised patient to get the blood work done.

## 2021-12-22 NOTE — Assessment & Plan Note (Signed)
Anxiety is stable on the medication. Continue the current medication.

## 2021-12-22 NOTE — Assessment & Plan Note (Signed)
Blood pressure 125/61 in the office today. Continue the current medication.

## 2021-12-24 ENCOUNTER — Telehealth: Payer: Self-pay | Admitting: Cardiovascular Disease

## 2021-12-24 DIAGNOSIS — N2581 Secondary hyperparathyroidism of renal origin: Secondary | ICD-10-CM | POA: Diagnosis not present

## 2021-12-24 DIAGNOSIS — E1122 Type 2 diabetes mellitus with diabetic chronic kidney disease: Secondary | ICD-10-CM | POA: Diagnosis not present

## 2021-12-24 DIAGNOSIS — D631 Anemia in chronic kidney disease: Secondary | ICD-10-CM | POA: Diagnosis not present

## 2021-12-24 DIAGNOSIS — I10 Essential (primary) hypertension: Secondary | ICD-10-CM | POA: Diagnosis not present

## 2021-12-24 DIAGNOSIS — N1832 Chronic kidney disease, stage 3b: Secondary | ICD-10-CM | POA: Diagnosis not present

## 2021-12-24 NOTE — Telephone Encounter (Signed)
Pt states that he was told by another provider that he should call to have an order put in for him to have an EKG done due to welling that he has going on. Pt would like a callback from  nurse as soon as possible. Please advise

## 2021-12-25 ENCOUNTER — Ambulatory Visit (INDEPENDENT_AMBULATORY_CARE_PROVIDER_SITE_OTHER): Payer: Medicare Other | Admitting: Medical

## 2021-12-25 ENCOUNTER — Encounter: Payer: Self-pay | Admitting: Medical

## 2021-12-25 VITALS — BP 120/58 | HR 68 | Ht 70.0 in | Wt 253.4 lb

## 2021-12-25 DIAGNOSIS — I10 Essential (primary) hypertension: Secondary | ICD-10-CM

## 2021-12-25 DIAGNOSIS — I25118 Atherosclerotic heart disease of native coronary artery with other forms of angina pectoris: Secondary | ICD-10-CM | POA: Diagnosis not present

## 2021-12-25 DIAGNOSIS — I480 Paroxysmal atrial fibrillation: Secondary | ICD-10-CM

## 2021-12-25 DIAGNOSIS — I5033 Acute on chronic diastolic (congestive) heart failure: Secondary | ICD-10-CM | POA: Diagnosis not present

## 2021-12-25 DIAGNOSIS — E782 Mixed hyperlipidemia: Secondary | ICD-10-CM

## 2021-12-25 DIAGNOSIS — R600 Localized edema: Secondary | ICD-10-CM | POA: Diagnosis not present

## 2021-12-25 NOTE — Patient Instructions (Signed)
Medication Instructions:  - Your physician recommends that you continue on your current medications as directed. Please refer to the Current Medication list given to you today.  *If you need a refill on your cardiac medications before your next appointment, please call your pharmacy*   Lab Work: - none ordered  If you have labs (blood work) drawn today and your tests are completely normal, you will receive your results only by: [place] (if you have MyChart) OR A paper copy in the mail If you have any lab test that is abnormal or we need to change your treatment, we will call you to review the results.   Testing/Procedures: - none ordered   Follow-Up: At [facility], you and your health needs are our priority.  As part of our continuing mission to provide you with exceptional heart care, we have created designated Provider Care Teams.  These Care Teams include your primary Cardiologist (physician) and Advanced Practice Providers (APPs -  Physician Assistants and Nurse Practitioners) who all work together to provide you with the care you need, when you need it.  We recommend signing up for the patient portal called "MyChart".  Sign up information is provided on this After Visit Summary.  MyChart is used to connect with patients for Virtual Visits (Telemedicine).  Patients are able to view lab/test results, encounter notes, upcoming appointments, etc.  Non-urgent messages can be sent to your provider as well.   To learn more about what you can do with MyChart, go to [email or web address].    Your next appointment:   Friday 01/10/22 @ 10:55 am   The format for your next appointment:   In Person  Provider:   Cadence Kathlen Mody, PA-C    Other Instructions N/a  Important Information About Sugar

## 2021-12-25 NOTE — Telephone Encounter (Signed)
Called and spoke with patient.   Patient had seen his nephrologist, and per the notes has significant lower extremity edema. Nephrology recommended that he be seen by cardiology and discuss having a repeat echo.   Pt offered appt with Cadence Furth, PA this afternoon, 5/31 at 3:10. Pt accepted and voiced appreciation for the assistance.

## 2021-12-25 NOTE — Progress Notes (Unsigned)
Cardiology Office Note:    Date:  12/27/2021   ID:  Joshua Roy, DOB 1950-09-12, MRN [id number]  PCP:  [name], MD  [facility] Cardiologist:  None  [facility] Electrophysiologist:  None   Referring MD: [name], MD   Chief Complaint: lower leg edema, discuss echo  History of Present Illness:    Joshua Roy is a 71 y.o. male with a hx of CAD status post CABG in March 2021, carotid disease status post right CEA 10/15/2019, postoperative A. fib, hypertension, hyperlipidemia, type 2 diabetes, tobacco abuse, COPD on 2L, obesity, CKD stage III, HFpEF, PSVT who is being seen today for the evaluation of shortness of breath.   Patient initially underwent cardiac cath 2001, revealing occluded mid to distal RCA, and was medically managed.  In January 2021, he had a profound episode of presyncope followed by brief episode of syncope for which she was seen in the [facility] ED, but left AMA.  He was subsequently noted to have progressive weight gain, dyspnea, lower extremity and scrotal edema, and orthopnea.  Echo showed normal LV function with grade 1 diastolic dysfunction and no significant valve disease.  He underwent diagnostic catheterization showing severe, multivessel CAD and underwent CABG x4 in March 2021.  Preop carotid Doppler showed severe right internal carotid artery stenosis this was treated with right carotid endarterectomy at the time of his CABG.  Postop course was minimally complicated by A. fib which was successfully managed with amiodarone, which has since been discontinued.  In April he was volume overloaded CT surgical visit Lasix was increased to 40 mg twice daily.  In Dec 07, 2019, he remained volume up and was hypoxic.  He was sent to the ER and subsequently admitted and aggressively diuresed.  Echo during hospitalization showed normal LV function 55 to 60%, mild LVH.  He was discharged home down 30 pounds.  Admitted 07/2021 for acute respiratory failure  with COVID PNA, acute HFpEF. He was treated with IV lasix.  Today, the patient reports lower leg edema has been worsening over the last week. He saw PCP and the kidney doctor since discharge. Nephrology doubled the lasix to '80mg'$  BID. He was also started on Farxiga '10mg'$  daily, however he has not started this yet. Patient denies chest pain. He feels fatigued. He is active at home.   Past Medical History:  Diagnosis Date   (HFpEF) heart failure with preserved ejection fraction ([place])    a. 07/2019 Echo: EF 60-65%. Mod LVH. Gr1 DD. No rwma. Nl RV size/fxn. Mildly dil LA; b. 11/2019 Echo: EF 55-60%, no rwma, mild LVH. Nl RV size/fxn. Mod dil LA.   ACE-inhibitor cough    Anxiety    Bronchitis 06/27/2016   Carotid arterial disease ([place])    a. 09/2019 Carotid U/S: RICA 28-41%, LICA 3-24%; b. 10/100 R CEA.   CKD (chronic kidney disease), stage III (HCC)    COPD (chronic obstructive pulmonary disease) (HCC)    cath, mild inf. hypokinesis EF 49%, 100% ROA   Coronary artery disease    a. 2001 Cath: LM 10, LAD 60p/m, D1 30, LCX 20p, RCA 162md-->Med Rx; b. 05/2017 Ex Mv: Ex time 5:46, antlat twi @ rest, fixed infarct w/ mild peri-infarct ischemia. EF 38%; c. 08/2019 Cath: LM 40ost, 70d, LAD 772mLCX 8050mCA 80-90p, 100m42m 09/2019 CABG x4: LIMA->LAD, VG->RI, VG->OM, VG->RPDA.   Diabetes mellitus type II    Hyperlipidemia    Hypertension    MI (myocardial infarction) (HCC)[place]  age 65   OA (osteoarthritis)    knee OA, injected 2012 by ortho   PAF (paroxysmal atrial fibrillation) ([place])    a. 09/2019 post-op CABG-->Amio.   Pneumonia    PSVT (paroxysmal supraventricular tachycardia) ([place])    a. 08/2019 Zio: 189 brief runs of SVT (fastest 174 x 6 beats, longest 15 beats @ 107).   Tobacco abuse     Past Surgical History:  Procedure Laterality Date   CARDIAC CATHETERIZATION  05/06/2000   @ [facility]   COLONOSCOPY WITH PROPOFOL N/A 04/20/2018   Procedure: COLONOSCOPY WITH PROPOFOL;  Surgeon: [name], MD;   Location: [facility] ENDOSCOPY;  Service: Endoscopy;  Laterality: N/A;   CORONARY ARTERY BYPASS GRAFT N/A 10/07/2019   Procedure: CORONARY ARTERY BYPASS GRAFTING (CABG) using LIMA to LAD; Endoscopic harvesting of left greater saphenous vein: SVG to RAMUS; SVG to OM1; SVG to PDA.;  Surgeon: Gaye Pollack, MD;  Location: [facility] OR;  Service: Open Heart Surgery;  Laterality: N/A;   ENDARTERECTOMY Right 10/07/2019   Procedure: ENDARTERECTOMY CAROTID;  Surgeon: Rosetta Posner, MD;  Location: [facility] OR;  Service: Vascular;  Laterality: Right;   ENDOVEIN HARVEST OF GREATER SAPHENOUS VEIN Left 10/07/2019   Procedure: [place] Of Greater Saphenous Vein;  Surgeon: Gaye Pollack, MD;  Location: [facility] OR;  Service: Open Heart Surgery;  Laterality: Left;   ESOPHAGOGASTRODUODENOSCOPY ENDOSCOPY     KNEE ARTHROSCOPY  07/25/04   left   [place]   right, open surgery- repair   RIGHT/LEFT HEART CATH AND CORONARY ANGIOGRAPHY Bilateral 09/08/2019   Procedure: RIGHT/LEFT HEART CATH AND CORONARY ANGIOGRAPHY;  Surgeon: Minna Merritts, MD;  Location: [place] CV LAB;  Service: Cardiovascular;  Laterality: Bilateral;   TEE WITHOUT CARDIOVERSION N/A 10/07/2019   Procedure: TRANSESOPHAGEAL ECHOCARDIOGRAM (TEE);  Surgeon: Gaye Pollack, MD;  Location: [place];  Service: Open Heart Surgery;  Laterality: N/A;   TOTAL KNEE ARTHROPLASTY Left 07/14/2016   Procedure: LEFT TOTAL KNEE ARTHROPLASTY;  Surgeon: Gaynelle Arabian, MD;  Location: [facility] ORS;  Service: Orthopedics;  Laterality: Left;   TOTAL KNEE ARTHROPLASTY Right 07/13/2017   Procedure: RIGHT TOTAL KNEE ARTHROPLASTY;  Surgeon: Gaynelle Arabian, MD;  Location: [facility] ORS;  Service: Orthopedics;  Laterality: Right;    Current Medications: Current Meds  Medication Sig   ALPRAZolam (XANAX) 0.25 MG tablet Take 1 tablet (0.25 mg total) by mouth 2 (two) times daily as needed for anxiety.   atorvastatin (LIPITOR) 80 MG tablet Take 1 tablet (80 mg total) by mouth daily.    clopidogrel (PLAVIX) 75 MG tablet Take 1 tablet (75 mg total) by mouth daily.   cyclobenzaprine (FLEXERIL) 5 MG tablet Take 1 tablet (5 mg total) by mouth 3 (three) times daily as needed for up to 10 days for muscle spasms.   FARXIGA 10 MG TABS tablet Take 10 mg by mouth daily.   fenofibrate 160 MG tablet Take 1 tablet (160 mg total) by mouth daily.   furosemide (LASIX) 40 MG tablet Take 2 tablets (80 mg total) by mouth 2 (two) times daily.   glimepiride (AMARYL) 2 MG tablet TAKE 1/2 TABLET BY MOUTH IN THE MORNING (Patient taking differently: Take 1 mg by mouth daily with breakfast.)   glucose blood (TRUE METRIX BLOOD GLUCOSE TEST) test strip 1 each by Other route daily. Use as instructed   iron polysaccharides (NIFEREX) 150 MG capsule Take 1 capsule (150 mg total) by mouth daily.   losartan (COZAAR) 50 MG tablet Take 50 mg  by mouth daily.   metFORMIN (GLUCOPHAGE) 1000 MG tablet TAKE 1 TABLET BY MOUTH TWICE A DAY (Patient taking differently: Take 1,000 mg by mouth 2 (two) times daily with a meal.)   metoprolol tartrate (LOPRESSOR) 25 MG tablet Take 1 tablet (25 mg total) by mouth 2 (two) times daily.   nitroGLYCERIN (NITROSTAT) 0.4 MG SL tablet Place 0.4 mg under the tongue every 5 (five) minutes as needed for chest pain.   pantoprazole (PROTONIX) 40 MG tablet TAKE 1 TABLET BY MOUTH DAILY BEFORE BREAKFAST   sildenafil (REVATIO) 20 MG tablet TAKE ONE TABLET BY MOUTH DAILY AS NEEDED   traZODone (DESYREL) 50 MG tablet TAKE 1 TABLET BY MOUTH EVERY DAY     Allergies:   Ramipril and Pseudoephedrine-guaifenesin er   Social History   Socioeconomic History   Marital status: Married    Spouse name: Janie   Number of children: 3   Years of education: Not on file   Highest education level: Not on file  Occupational History   Occupation: Engineer, manufacturing, Estate agent  Tobacco Use   Smoking status: Every Day    Packs/day: 1.00    Years: 52.00    Pack years: 52.00    Types: Cigarettes   Smokeless  tobacco: Never   Tobacco comments:    per patient wears a patch and has cut down on cigarettes/day (10/05/2019)  Vaping Use   Vaping Use: Never used  Substance and Sexual Activity   Alcohol use: Yes    Alcohol/week: 0.0 standard drinks    Comment: 5-6 cocktails, 1 times a week   Drug use: No   Sexual activity: Not on file  Other Topics Concern   Not on file  Social History Narrative   From [place].  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   Social Determinants of Health   Financial Resource Strain: Low Risk    Difficulty of Paying Living Expenses: Not hard at all  Food Insecurity: Not on file  Transportation Needs: No Transportation Needs   Lack of Transportation (Medical): No   Lack of Transportation (Non-Medical): No  Physical Activity: Insufficiently Active   Days of Exercise per Week: 2 days   Minutes of Exercise per Session: 20 min  Stress: No Stress Concern Present   Feeling of Stress : Not at all  Social Connections: Moderately Isolated   Frequency of Communication with Friends and Family: More than three times a week   Frequency of Social Gatherings with Friends and Family: More than three times a week   Attends Religious Services: Never   Marine scientist or Organizations: No   Attends Music therapist: Never   Marital Status: Married     Family History: The patient's family history includes Alcohol abuse in his father; Cancer in his brother; Colon cancer in his brother; Diabetes in his father and sister; Heart disease in his father and sister; Hypertension in his father. There is no history of Prostate cancer.  ROS:   Please see the history of present illness.     All other systems reviewed and are negative.  EKGs/Labs/Other Studies Reviewed:    The following studies were reviewed today: Echo 07/2021   1. Left ventricular ejection fraction, by estimation, is 55 to 60%. The  left ventricle has normal function. The left ventricle has  no regional  wall motion abnormalities. Left ventricular diastolic parameters are  indeterminate.   2. Right ventricular systolic function is normal. The right  ventricular  size is normal. Tricuspid regurgitation signal is inadequate for assessing  PA pressure.   3. Left atrial size was mildly dilated.   4. Right atrial size was mildly dilated.   5. The mitral valve is normal in structure. No evidence of mitral valve  regurgitation. No evidence of mitral stenosis.   6. The aortic valve is calcified. Aortic valve regurgitation is not  visualized. Aortic valve sclerosis/calcification is present, without any  evidence of aortic stenosis.   R/L Heart cath 2021 RIGHT/LEFT HEART CATH AND CORONARY ANGIOGRAPHY   Conclusion  Mid LM to Dist LM lesion is 70% stenosed. Ost LM lesion is 40% stenosed. Prox LAD to Mid LAD lesion is 70% stenosed. Mid Cx lesion is 80% stenosed. Ost RCA to Prox RCA lesion is 95% stenosed. Mid RCA to Dist RCA lesion is 100% stenosed. RPAV lesion is 90% stenosed. Hemodynamic findings consistent with moderate pulmonary hypertension. LV end diastolic pressure is normal. Details Cardiac Catheterization Procedure Note  Name: OLUSEGUN GERSTENBERGER MRN: [id number] DOB: 09/20/1950  Procedure: Left Heart Cath, Selective Coronary Angiography, LV angiography  Indication:   71 year old male with a history of CAD status post catheterization in 2001 revealing an occluded mid-distal RCA with otherwise moderate CAD, hypertension, hyperlipidemia, type 2 diabetes mellitus, obesity, tobacco abuse, and COPD stress testing in November 2018 in the setting of preoperative evaluation, and this showed prior inferior scar with mild peri-infarct ischemia.    August 02 2019,sudden onset of dizziness and vertiginous symptoms, followed by weakness and fall. disoriented.    Once he was in car going home from party, he was witnessed to briefly lose consciousness.  He remained disoriented and was  taken to the [facility] ED.   There, creatinine was elevated above prior baseline at 1.75.  H&H were normal.  High-sensitivity troponin was 13.  After waiting in the waiting room for about 3 hours, he decided to leave since he felt better. progressive weight gain (25 pounds), along with increasing dyspnea exertion, orthopnea, and lower extremity and scrotal edema.    At a follow-up with primary care, he was noted to be volume overloaded and referred to the emergency department on January 12.    hypoxic with saturations in the 80s on room air.  BNP was 401.  High-sensitivity troponin was 28.  Creatinine was slightly improved at 1.57 with a BUN of 27. CT angio of the chest was negative for PE and showed mild cardiomegaly with coronary vascular calcification and aortic atherosclerosis.  .  Admission was recommended however patient declined   echocardiogram,  normal LV function and grade 1 diastolic dysfunction.  No significant valvular disease was noted.  continues to experience dyspnea on exertion after walking about 50 to 100 yards.  He has not experienced any chest pain.  He has noted significant improvement in abdominal girth and scrotal edema.     bilateral lower extremity/calf heaviness and claudication after walking about 25 yards.  He was referred for right and left heart cath given unstable anginal sx.   Procedural details: The right groin was prepped, draped, and anesthetized with 1% lidocaine. Using modified Seldinger technique, a 5 French sheath was introduced into the right femoral artery. Standard Judkins catheters (JL 4, JR 4 and pigtail catheter) were used for coronary angiography and left ventriculography. Catheter exchanges were performed over a guidewire. There were no immediate procedural complications. The patient was transferred to the post catheterization recovery area for further monitoring.  Moderate sedation: 1. Sedation used: 2  mg versed IV, 50 ug fentanyl IV 2. Time of  administration:       8:30:AM     Time patient left for recovery:  9:15 Am 3. I was Face to Face with the patient during this time: (code: [phone])   Procedural Findings:    Coronary angiography:  Coronary dominance: Right or codominant  Left mainstem:   Large vessel that bifurcates into the LAD and left circumflex, mild to moderate ostial left main disease 40%, 70% distal left main disease  Left anterior descending (LAD):   Large vessel that extends to the apical region, diagonal branch 2 of moderate size,  long region of  stenosis estimated 70%  Left circumflex (LCx):  Large vessel with OM branch 2, mid left circumflex estimated at 80%  Right coronary artery (RCA):  Right dominant vessel with PL and PDA, 80 to 90% proximal stenosis, occluded in the mid vessels with collaterals from left to right  Left ventriculography: Left ventricular systolic function was not measured given renal dysfunction creatinine 1.8, crossed for pressures  No significant aortic valve stenosis   Right heart pressures RA 4 RV 61/7 PA 67/23 mean 42 Wedge pressure 43 Left ventricular end-diastolic pressure 10  Cardiac index 2.13 cardiac output 4.79  Final Conclusions:   Severe three-vessel native coronary disease including left main Elevated right heart pressures consistent with moderate to severe pulmonary hypertension\ Curiously wedge is markedly elevated with appropriate left ventricular end-diastolic pressure, etiology of disconnect is unclear.  No significant MR. Recent CT scan with no significant abnormality noted, normal ejection fraction on echo.  He has been on Lasix, overdiuresis contributing to worsening renal dysfunction -Severe underlying COPD from long history of smoking   Recommendations:  Case discussed with interventional cardiology, Concerned about distal left main disease Also mid circumflex, LAD Will refer to CT surgery, also advanced heart failure clinic to assist with right heart  pressures --He does not want to stay in the hospital and be transferred to [facility] , with optimization of his condition Prefers to do this as an outpatient   Ida Rogue 09/08/2019, 9:28 AM     Coronary Diagrams  Diagnostic Dominance: Right Intervention  EKG:  EKG is  ordered today.  The ekg ordered today demonstrates NSR 1st degree AV block, RAD, septal q waves, nonspecific T wave changes, no change  Recent Labs: 06/13/2021: ALT 12 08/09/2021: Hemoglobin 10.7; Platelets 373 08/11/2021: B Natriuretic Peptide 813.7 08/12/2021: Magnesium 1.9 08/13/2021: BUN 48; Creatinine, Ser 2.05; Potassium 4.5; Sodium 141  Recent Lipid Panel    Component Value Date/Time   CHOL 124 06/13/2021 0834   CHOL 113 05/14/2013 0252   TRIG 138 06/13/2021 0834   TRIG 232 (H) 05/14/2013 0252   HDL 39 (L) 06/13/2021 0834   HDL 23 (L) 05/14/2013 0252   CHOLHDL 3.2 06/13/2021 0834   VLDL 19 10/11/2019 0254   VLDL 46 (H) 05/14/2013 0252   LDLCALC 63 06/13/2021 0834   LDLCALC 44 05/14/2013 0252   LDLDIRECT 109.0 11/15/2014 0851     Physical Exam:    VS:  BP (!) 120/58 (BP Location: Left Arm, Patient Position: Sitting, Cuff Size: Normal)   Pulse 68   Ht '5\' 10"'$  (1.778 m)   Wt 253 lb 6 oz (114.9 kg)   SpO2 90%   BMI 36.36 kg/m     Wt Readings from Last 3 Encounters:  12/25/21 253 lb 6 oz (114.9 kg)  12/20/21 252 lb 9.6 oz (114.6 kg)  08/09/21 251 lb  5.2 oz (114 kg)     GEN:  Well nourished, well developed in no acute distress HEENT: Normal NECK: No JVD; No carotid bruits LYMPHATICS: No lymphadenopathy CARDIAC: RRR, no murmurs, rubs, gallops RESPIRATORY:  Clear to auscultation without rales, wheezing or rhonchi  ABDOMEN: Soft, non-tender, non-distended MUSCULOSKELETAL:  1+ lower leg edema; No deformity  SKIN: Warm and dry NEUROLOGIC:  Alert and oriented x 3 PSYCHIATRIC:  Normal affect   ASSESSMENT:    1. Lower leg edema   2. Acute on chronic heart failure with preserved ejection  fraction (HFpEF) ([place])   3. Coronary artery disease of native artery of native heart with stable angina pectoris ([place])   4. Essential hypertension    PLAN:    In order of problems listed above:  LLE HFrEF Nephrologist recently increased lasix to '80mg'$  BID. He has 1+ lower leg edema on exam today. We will continue current lasix dose and follow-up in 2 weeks. Check BMET at that time. Prior echo January1/2023 showed normal LVEF.  CAD s/p CABG Patient denies anginal symptoms. No further ischemic work-up indicated. Continue Plavix, statin and BB therapy.   HTN BP is wnl, continue current medications.   Disposition: Follow up in 2 week(s) with MD/APP     Signed, Anita Laguna Ninfa Meeker, PA-C  12/27/2021 1:33 PM    [facility]

## 2021-12-31 ENCOUNTER — Other Ambulatory Visit: Payer: Self-pay | Admitting: Internal Medicine

## 2021-12-31 ENCOUNTER — Other Ambulatory Visit: Payer: Self-pay | Admitting: *Deleted

## 2021-12-31 DIAGNOSIS — E785 Hyperlipidemia, unspecified: Secondary | ICD-10-CM

## 2021-12-31 NOTE — Patient Outreach (Signed)
[facility]) Care Management  12/31/2021  Joshua Roy Gillian 23-Dec-1950 [id number]   Telephone Assessment-Successful  RN spoke with pt today concerning [facility] services and introduced purpose pf the call once verified. Pt reports all issues indicating no needs at this time. RN inquired on the following:  -COPD controlled with inhalers with no acute or immediate needs -DM pt reports "good" A1C 7.0  with CBG 95-120 (Metformin). No needs. Heart Failure: Pt reports as one time having fluid retention and treated with an increase in his Lasix due to pharmacy's delay with obtaining [place]. Pt reports he is on the increase dose of Lasix for 2 weeks and will follow up with this provide on future interventions. Pt reports adherence with his daily weights and feels he is managing his condition with no needs for case management at this time. RN educated pt on there HF zones and stress the importance of reporting any weigh gained over 3 lbs overnight or 5 lbs within one week to his provider for intervention.  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  Offered care management services for HF management with monitoring tools for regulating his weight and the importance of reported any abnormal symptoms. Pt very appreciative and declined [facility] services but receptive to information packet and contact via [facility] office for possible services in there future.   Pt aware RN will notification pt's provider of pt's disposition with [facility] services and close this case at this time.  Raina Mina, RN Care Management Coordinator [place] Office [phone]

## 2022-01-02 ENCOUNTER — Other Ambulatory Visit: Payer: Self-pay | Admitting: *Deleted

## 2022-01-02 ENCOUNTER — Other Ambulatory Visit: Payer: Self-pay | Admitting: Nurse Practitioner

## 2022-01-02 DIAGNOSIS — F419 Anxiety disorder, unspecified: Secondary | ICD-10-CM

## 2022-01-02 MED ORDER — ALPRAZOLAM 0.5 MG PO TABS: 0.5000 mg | ORAL_TABLET | Freq: Every evening | ORAL | 0 refills | Status: DC | PRN

## 2022-01-02 NOTE — Telephone Encounter (Signed)
Medication refilled

## 2022-01-10 ENCOUNTER — Encounter: Payer: Self-pay | Admitting: Medical

## 2022-01-10 ENCOUNTER — Ambulatory Visit (INDEPENDENT_AMBULATORY_CARE_PROVIDER_SITE_OTHER): Payer: Medicare Other | Admitting: Medical

## 2022-01-10 ENCOUNTER — Other Ambulatory Visit
Admission: RE | Admit: 2022-01-10 | Discharge: 2022-01-10 | Disposition: A | Discharge status: Home / Self Care | Payer: Medicare Other | Attending: Medical | Admitting: Medical

## 2022-01-10 VITALS — BP 112/60 | HR 62 | Ht 70.0 in | Wt 250.0 lb

## 2022-01-10 DIAGNOSIS — I2510 Atherosclerotic heart disease of native coronary artery without angina pectoris: Secondary | ICD-10-CM | POA: Diagnosis not present

## 2022-01-10 DIAGNOSIS — I10 Essential (primary) hypertension: Secondary | ICD-10-CM | POA: Diagnosis not present

## 2022-01-10 DIAGNOSIS — I5033 Acute on chronic diastolic (congestive) heart failure: Secondary | ICD-10-CM

## 2022-01-10 DIAGNOSIS — R600 Localized edema: Secondary | ICD-10-CM | POA: Diagnosis not present

## 2022-01-10 DIAGNOSIS — N183 Chronic kidney disease, stage 3 unspecified: Secondary | ICD-10-CM | POA: Diagnosis not present

## 2022-01-10 LAB — BASIC METABOLIC PANEL
Anion gap: 10 (ref 5–15)
BUN: 43 mg/dL — ABNORMAL HIGH (ref 8–23)
CO2: 32 mmol/L (ref 22–32)
Calcium: 9 mg/dL (ref 8.9–10.3)
Chloride: 91 mmol/L — ABNORMAL LOW (ref 98–111)
Creatinine, Ser: 2.37 mg/dL — ABNORMAL HIGH (ref 0.61–1.24)
GFR, Estimated: 28 mL/min — ABNORMAL LOW (ref >60)
Glucose, Bld: 131 mg/dL — ABNORMAL HIGH (ref 70–99)
Potassium: 4.8 mmol/L (ref 3.5–5.1)
Sodium: 133 mmol/L — ABNORMAL LOW (ref 135–145)

## 2022-01-10 NOTE — Patient Instructions (Signed)
Medication Instructions:  Your physician recommends that you continue on your current medications as directed. Please refer to the Current Medication list given to you today.  *If you need a refill on your cardiac medications before your next appointment, please call your pharmacy*   Lab Work: [facility] today  Please have your lab drawn at the [facility]. Stop at the Registration desk to check in.  If you have labs (blood work) drawn today and your tests are completely normal, you will receive your results only by: [place] (if you have MyChart) OR A paper copy in the mail If you have any lab test that is abnormal or we need to change your treatment, we will call you to review the results.   Testing/Procedures: None ordered   Follow-Up: At [facility], you and your health needs are our priority.  As part of our continuing mission to provide you with exceptional heart care, we have created designated Provider Care Teams.  These Care Teams include your primary Cardiologist (physician) and Advanced Practice Providers (APPs -  Physician Assistants and Nurse Practitioners) who all work together to provide you with the care you need, when you need it.  We recommend signing up for the patient portal called "MyChart".  Sign up information is provided on this After Visit Summary.  MyChart is used to connect with patients for Virtual Visits (Telemedicine).  Patients are able to view lab/test results, encounter notes, upcoming appointments, etc.  Non-urgent messages can be sent to your provider as well.   To learn more about what you can do with MyChart, go to [email or web address].    Your next appointment:   2-3 month(s)  The format for your next appointment:   In Person  Provider:   Ida Rogue, MD{     Other Instructions N/A

## 2022-01-10 NOTE — Progress Notes (Signed)
Cardiology Office Note:    Date:  01/10/2022   ID:  Joshua Roy, DOB 06-Aug-1950, MRN [id number]  PCP:  [name], MD  [facility] Cardiologist:  None  [facility] Electrophysiologist:  None   Referring MD: [name], MD   Chief Complaint: 2 week follow-up  History of Present Illness:    Joshua Roy is a 71 y.o. male with a hx of CAD status post CABG in March 2021, carotid disease status post right CEA 10/15/2019, postoperative A. fib, hypertension, hyperlipidemia, type 2 diabetes, tobacco abuse, COPD on 2L, obesity, CKD stage III, HFpEF, PSVT who is being seen today for the evaluation of shortness of breath.    Patient initially underwent cardiac cath 2001, revealing occluded mid to distal RCA, and was medically managed.  In January 2021, he had a profound episode of presyncope followed by brief episode of syncope for which she was seen in the [facility] ED, but left AMA.  He was subsequently noted to have progressive weight gain, dyspnea, lower extremity and scrotal edema, and orthopnea.  Echo showed normal LV function with grade 1 diastolic dysfunction and no significant valve disease.  He underwent diagnostic catheterization showing severe, multivessel CAD and underwent CABG x4 in March 2021.  Preop carotid Doppler showed severe right internal carotid artery stenosis this was treated with right carotid endarterectomy at the time of his CABG.  Postop course was minimally complicated by A. fib which was successfully managed with amiodarone, which has since been discontinued.  In April he was volume overloaded CT surgical visit Lasix was increased to 40 mg twice daily.  In Dec 07, 2019, he remained volume up and was hypoxic.  He was sent to the ER and subsequently admitted and aggressively diuresed.  Echo during hospitalization showed normal LV function 55 to 60%, mild LVH.  He was discharged home down 30 pounds.   Admitted 07/2021 for acute respiratory failure with COVID  PNA, acute HFpEF. He was treated with IV lasix.  Last seen 12/25/21 and reported lower leg edema. Nephrology had doubled the lasix to '80mg'$  BID. He was also started on Farxiga. Close follow-up was recommended.   Today, the patient reports lower leg edema has improved. 3 days ago he went down to lasix '40mg'$  BID. He has been taking daily weights. He denies chest pain. He has chronic SOB. On exam no lower leg edema appreciated. He does report a cough for the last few weeks. He smokes and has COPD. Recommended he see PCP for this. May have COPD exacerbation.   Past Medical History:  Diagnosis Date   (HFpEF) heart failure with preserved ejection fraction ([place])    a. 07/2019 Echo: EF 60-65%. Mod LVH. Gr1 DD. No rwma. Nl RV size/fxn. Mildly dil LA; b. 11/2019 Echo: EF 55-60%, no rwma, mild LVH. Nl RV size/fxn. Mod dil LA.   ACE-inhibitor cough    Anxiety    Bronchitis 06/27/2016   Carotid arterial disease ([place])    a. 09/2019 Carotid U/S: RICA 59-45%, LICA 8-59%; b. 08/9242 R CEA.   CKD (chronic kidney disease), stage III (HCC)    COPD (chronic obstructive pulmonary disease) (HCC)    cath, mild inf. hypokinesis EF 49%, 100% ROA   Coronary artery disease    a. 2001 Cath: LM 10, LAD 60p/m, D1 30, LCX 20p, RCA 170md-->Med Rx; b. 05/2017 Ex Mv: Ex time 5:46, antlat twi @ rest, fixed infarct w/ mild peri-infarct ischemia. EF 38%; c. 08/2019 Cath: LM 40ost, 70d,  LAD 58m LCX 850mRCA 80-90p, 10043m. 09/2019 CABG x4: LIMA->LAD, VG->RI, VG->OM, VG->RPDA.   Diabetes mellitus type II    Hyperlipidemia    Hypertension    MI [facility] infarction) ([facility] 45OA (osteoarthritis)    knee OA, injected 2012 by ortho   PAF (paroxysmal atrial fibrillation) ([place]  a. 09/2019 post-op CABG-->Amio.   Pneumonia    PSVT (paroxysmal supraventricular tachycardia) ([place]  a. 08/2019 Zio: 189 brief runs of SVT (fastest 174 x 6 beats, longest 15 beats @ 107).   Tobacco abuse     Past Surgical History:  Procedure  Laterality Date   CARDIAC CATHETERIZATION  05/06/2000   [place]/A 04/20/2018   Procedure: COLONOSCOPY WITH PROPOFOL;  Surgeon: WohLucilla LameD;  [facility];  Service: Endoscopy;  Laterality: N/A;   CORONARY ARTERY BYPASS GRAFT N/A 10/07/2019   Procedure: CORONARY ARTERY BYPASS GRAFTING (CABG) using LIMA to LAD; Endoscopic harvesting of left greater saphenous vein: SVG to RAMUS; SVG to OM1; SVG to PDA.;  Surgeon: BarGaye PollackD;  [facility];  Service: Open Heart Surgery;  Laterality: N/A;   ENDARTERECTOMY Right 10/07/2019   Procedure: ENDARTERECTOMY CAROTID;  Surgeon: EarRosetta PosnerD;  [facility];  Service: Vascular;  Laterality: Right;   ENDOVEIN HARVEST OF GREATER SAPHENOUS VEIN Left 10/07/2019   Procedure: EndCharleston Ropes Greater Saphenous Vein;  Surgeon: BarGaye PollackD;  [facility];  Service: Open Heart Surgery;  Laterality: Left;   ESOPHAGOGASTRODUODENOSCOPY ENDOSCOPY     KNEE ARTHROSCOPY  07/25/04   left   [place], open surgery- repair   RIGHT/LEFT HEART CATH AND CORONARY ANGIOGRAPHY Bilateral 09/08/2019   Procedure: RIGHT/LEFT HEART CATH AND CORONARY ANGIOGRAPHY;  Surgeon: GolMinna MerrittsD;  [facility];  Service: Cardiovascular;  Laterality: Bilateral;   TEE WITHOUT CARDIOVERSION N/A 10/07/2019   Procedure: TRANSESOPHAGEAL ECHOCARDIOGRAM (TEE);  Surgeon: BarGaye PollackD;  [facility];  Laterality: N/A;   TOTAL KNEE ARTHROPLASTY Left 07/14/2016   Procedure: LEFT TOTAL KNEE ARTHROPLASTY;  Surgeon: FraGaynelle ArabianD;  Location[facility];  Service: Orthopedics;  Laterality: Left;   TOTAL KNEE ARTHROPLASTY Right 07/13/2017   Procedure: RIGHT TOTAL KNEE ARTHROPLASTY;  Surgeon: AluGaynelle ArabianD;  Location: [facility];  Service: Orthopedics;  Laterality: Right;    Current Medications: Current Meds  Medication Sig   ALPRAZolam (XANAX) 0.5 MG  tablet Take 1 tablet (0.5 mg total) by mouth at bedtime as needed for anxiety.   atorvastatin (LIPITOR) 80 MG tablet Take 1 tablet (80 mg total) by mouth daily.   clopidogrel (PLAVIX) 75 MG tablet Take 1 tablet (75 mg total) by mouth daily.   FARXIGA 10 MG TABS tablet Take 10 mg by mouth daily.   fenofibrate 160 MG tablet Take 1 tablet (160 mg total) by mouth daily.   furosemide (LASIX) 40 MG tablet Take 2 tablets (80 mg total) by mouth 2 (two) times daily. (Patient taking differently: Take 40 mg by mouth 2 (two) times daily.)   glimepiride (AMARYL) 2 MG tablet TAKE 1/2 TABLET BY MOUTH IN THE MORNING (Patient taking differently: Take 1 mg by mouth daily with breakfast.)   glucose blood (TRUE METRIX BLOOD GLUCOSE TEST) test strip 1 each by Other route daily. Use as instructed   iron polysaccharides (NIFEREX) 150 MG capsule Take 1  capsule (150 mg total) by mouth daily.   losartan (COZAAR) 50 MG tablet Take 50 mg by mouth daily.   metFORMIN (GLUCOPHAGE) 1000 MG tablet TAKE 1 TABLET BY MOUTH TWICE A DAY   metoprolol tartrate (LOPRESSOR) 25 MG tablet Take 1 tablet (25 mg total) by mouth 2 (two) times daily.   nitroGLYCERIN (NITROSTAT) 0.4 MG SL tablet Place 0.4 mg under the tongue every 5 (five) minutes as needed for chest pain.   pantoprazole (PROTONIX) 40 MG tablet TAKE 1 TABLET BY MOUTH DAILY BEFORE BREAKFAST   sildenafil (REVATIO) 20 MG tablet TAKE ONE TABLET BY MOUTH DAILY AS NEEDED   traZODone (DESYREL) 50 MG tablet TAKE 1 TABLET BY MOUTH EVERY DAY     Allergies:   Ramipril and Pseudoephedrine-guaifenesin er   Social History   Socioeconomic History   Marital status: Married    Spouse name: Janie   Number of children: 3   Years of education: Not on file   Highest education level: Not on file  Occupational History   Occupation: Engineer, manufacturing, Estate agent  Tobacco Use   Smoking status: Every Day    Packs/day: 1.00    Years: 52.00    Total pack years: 52.00    Types: Cigarettes    Smokeless tobacco: Never   Tobacco comments:    per patient wears a patch and has cut down on cigarettes/day (10/05/2019)  Vaping Use   Vaping Use: Never used  Substance and Sexual Activity   Alcohol use: Yes    Alcohol/week: 0.0 standard drinks of alcohol    Comment: 5-6 cocktails, 1 times a week   Drug use: No   Sexual activity: Not on file  Other Topics Concern   Not on file  Social History Narrative   From [place].  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   Social Determinants of Health   Financial Resource Strain: Low Risk  (06/14/2021)   Overall Financial Resource Strain (CARDIA)    Difficulty of Paying Living Expenses: Not hard at all  Food Insecurity: No Food Insecurity (04/27/2020)   Hunger Vital Sign    Worried About Running Out of Food in the Last Year: Never true    Ran Out of Food in the Last Year: Never true  Transportation Needs: No Transportation Needs (06/14/2021)   PRAPARE - Hydrologist (Medical): No    Lack of Transportation (Non-Medical): No  Physical Activity: Insufficiently Active (06/14/2021)   Exercise Vital Sign    Days of Exercise per Week: 2 days    Minutes of Exercise per Session: 20 min  Stress: No Stress Concern Present (06/14/2021)   [place]    Feeling of Stress : Not at all  Social Connections: Moderately Isolated (06/14/2021)   Social Connection and Isolation Panel [NHANES]    Frequency of Communication with Friends and Family: More than three times a week    Frequency of Social Gatherings with Friends and Family: More than three times a week    Attends Religious Services: Never    Marine scientist or Organizations: No    Attends Music therapist: Never    Marital Status: Married     Family History: The patient's family history includes Alcohol abuse in his father; Cancer in his brother; Colon cancer in his brother;  Diabetes in his father and sister; Heart disease in his father and sister; Hypertension in his father.  There is no history of Prostate cancer.  ROS:   Please see the history of present illness.     All other systems reviewed and are negative.  EKGs/Labs/Other Studies Reviewed:    The following studies were reviewed today: Echo 07/2021   1. Left ventricular ejection fraction, by estimation, is 55 to 60%. The  left ventricle has normal function. The left ventricle has no regional  wall motion abnormalities. Left ventricular diastolic parameters are  indeterminate.   2. Right ventricular systolic function is normal. The right ventricular  size is normal. Tricuspid regurgitation signal is inadequate for assessing  PA pressure.   3. Left atrial size was mildly dilated.   4. Right atrial size was mildly dilated.   5. The mitral valve is normal in structure. No evidence of mitral valve  regurgitation. No evidence of mitral stenosis.   6. The aortic valve is calcified. Aortic valve regurgitation is not  visualized. Aortic valve sclerosis/calcification is present, without any  evidence of aortic stenosis.    R/L Heart cath 2021 RIGHT/LEFT HEART CATH AND CORONARY ANGIOGRAPHY    Conclusion   Mid LM to Dist LM lesion is 70% stenosed. Ost LM lesion is 40% stenosed. Prox LAD to Mid LAD lesion is 70% stenosed. Mid Cx lesion is 80% stenosed. Ost RCA to Prox RCA lesion is 95% stenosed. Mid RCA to Dist RCA lesion is 100% stenosed. RPAV lesion is 90% stenosed. Hemodynamic findings consistent with moderate pulmonary hypertension. LV end diastolic pressure is normal. Details Cardiac Catheterization Procedure Note  Name: ANIL HAVARD MRN: [id number] DOB: [birth year]  Procedure: Left Heart Cath, Selective Coronary Angiography, LV angiography  Indication:   71 year old male with a history of CAD status post catheterization in 2001 revealing an occluded mid-distal RCA with otherwise  moderate CAD, hypertension, hyperlipidemia, type 2 diabetes mellitus, obesity, tobacco abuse, and COPD stress testing in November 2018 in the setting of preoperative evaluation, and this showed prior inferior scar with mild peri-infarct ischemia.    August 02 2019,sudden onset of dizziness and vertiginous symptoms, followed by weakness and fall. disoriented.    Once he was in car going home from party, he was witnessed to briefly lose consciousness.  He remained disoriented and was taken to the [facility] ED.   There, creatinine was elevated above prior baseline at 1.75.  H&H were normal.  High-sensitivity troponin was 13.  After waiting in the waiting room for about 3 hours, he decided to leave since he felt better. progressive weight gain (25 pounds), along with increasing dyspnea exertion, orthopnea, and lower extremity and scrotal edema.    At a follow-up with primary care, he was noted to be volume overloaded and referred to the emergency department on January 12.    hypoxic with saturations in the 80s on room air.  BNP was 401.  High-sensitivity troponin was 28.  Creatinine was slightly improved at 1.57 with a BUN of 27. CT angio of the chest was negative for PE and showed mild cardiomegaly with coronary vascular calcification and aortic atherosclerosis.  .  Admission was recommended however patient declined   echocardiogram,  normal LV function and grade 1 diastolic dysfunction.  No significant valvular disease was noted.  continues to experience dyspnea on exertion after walking about 50 to 100 yards.  He has not experienced any chest pain.  He has noted significant improvement in abdominal girth and scrotal edema.     bilateral lower extremity/calf heaviness and claudication after walking about 25 yards.  He was referred for right and left heart cath given unstable anginal sx.   Procedural details: The right groin was prepped, draped, and anesthetized with 1% lidocaine. Using modified  Seldinger technique, a 5 French sheath was introduced into the right femoral artery. Standard Judkins catheters (JL 4, JR 4 and pigtail catheter) were used for coronary angiography and left ventriculography. Catheter exchanges were performed over a guidewire. There were no immediate procedural complications. The patient was transferred to the post catheterization recovery area for further monitoring.  Moderate sedation: 1. Sedation used: 2 mg versed IV, 50 ug fentanyl IV 2. Time of administration:       8:30:AM     Time patient left for recovery:  9:15 Am 3. I was Face to Face with the patient during this time: (code: [phone])   Procedural Findings:    Coronary angiography:  Coronary dominance: Right or codominant  Left mainstem:   Large vessel that bifurcates into the LAD and left circumflex, mild to moderate ostial left main disease 40%, 70% distal left main disease  Left anterior descending (LAD):   Large vessel that extends to the apical region, diagonal branch 2 of moderate size,  long region of  stenosis estimated 70%  Left circumflex (LCx):  Large vessel with OM branch 2, mid left circumflex estimated at 80%  Right coronary artery (RCA):  Right dominant vessel with PL and PDA, 80 to 90% proximal stenosis, occluded in the mid vessels with collaterals from left to right  Left ventriculography: Left ventricular systolic function was not measured given renal dysfunction creatinine 1.8, crossed for pressures  No significant aortic valve stenosis   Right heart pressures RA 4 RV 61/7 PA 67/23 mean 42 Wedge pressure 43 Left ventricular end-diastolic pressure 10  Cardiac index 2.13 cardiac output 4.79  Final Conclusions:   Severe three-vessel native coronary disease including left main Elevated right heart pressures consistent with moderate to severe pulmonary hypertension\ Curiously wedge is markedly elevated with appropriate left ventricular end-diastolic pressure, etiology of  disconnect is unclear.  No significant MR. Recent CT scan with no significant abnormality noted, normal ejection fraction on echo.  He has been on Lasix, overdiuresis contributing to worsening renal dysfunction -Severe underlying COPD from long history of smoking   Recommendations:  Case discussed with interventional cardiology, Concerned about distal left main disease Also mid circumflex, LAD Will refer to CT surgery, also advanced heart failure clinic to assist with right heart pressures --He does not want to stay in the hospital and be transferred to [facility] , with optimization of his condition Prefers to do this as an outpatient   Joshua Roy 09/08/2019, 9:28 AM      Coronary Diagrams   Diagnostic Dominance: Right Intervention    EKG:  EKG is  ordered today.  The ekg ordered today demonstrates NSR 62bpm, 1st degree AV block, nonspecific T wave changes  Recent Labs: 06/13/2021: ALT 12 08/09/2021: Hemoglobin 10.7; Platelets 373 08/11/2021: B Natriuretic Peptide 813.7 08/12/2021: Magnesium 1.9 08/13/2021: BUN 48; Creatinine, Ser 2.05; Potassium 4.5; Sodium 141  Recent Lipid Panel    Component Value Date/Time   CHOL 124 06/13/2021 0834   CHOL 113 05/14/2013 0252   TRIG 138 06/13/2021 0834   TRIG 232 (H) 05/14/2013 0252   HDL 39 (L) 06/13/2021 0834   HDL 23 (L) 05/14/2013 0252   CHOLHDL 3.2 06/13/2021 0834   VLDL 19 10/11/2019 0254   VLDL 46 (H) 05/14/2013 0252   LDLCALC 63 06/13/2021 4627  LDLCALC 44 05/14/2013 0252   LDLDIRECT 109.0 11/15/2014 0851      Physical Exam:    VS:  BP 112/60 (BP Location: Left Arm, Patient Position: Sitting, Cuff Size: Normal)   Pulse 62   Ht '5\' 10"'$  (1.778 m)   Wt 250 lb (113.4 kg)   BMI 35.87 kg/m     Wt Readings from Last 3 Encounters:  01/10/22 250 lb (113.4 kg)  12/25/21 253 lb 6 oz (114.9 kg)  12/20/21 252 lb 9.6 oz (114.6 kg)     GEN:  Well nourished, well developed in no acute distress HEENT: Normal NECK: No JVD; No  carotid bruits LYMPHATICS: No lymphadenopathy CARDIAC: RRR, no murmurs, rubs, gallops RESPIRATORY:  Clear to auscultation without rales, wheezing or rhonchi  ABDOMEN: Soft, non-tender, non-distended MUSCULOSKELETAL:  No edema; No deformity  SKIN: Warm and dry NEUROLOGIC:  Alert and oriented x 3 PSYCHIATRIC:  Normal affect   ASSESSMENT:    1. Acute on chronic diastolic CHF (congestive heart failure) ([place])   2. Lower leg edema   3. Essential hypertension   4. Coronary artery disease involving native coronary artery of native heart without angina pectoris   5. Stage 3 chronic kidney disease, unspecified whether stage 3a or 3b CKD (HCC)    PLAN:    In order of problems listed above:  Lower leg edema HFpEF Lower leg edema resolved with double lasix dose x 10 days. The patient is back taking lasix '40mg'$  BID. He is taking daily weights. CHF educations discussed. I will check a BMET today. Most recent echo 07/2021 showed LVEF 55-60%, no WMA. Continue Farxiga, Lopressor, and Losartan.   CAD s/p CABG Patient denies chest pain. He is relatively active around the house. Continue Plavix, statin, fenofibrate, nd Lopressor. No further ischemic work-up indicated.   HTN BP good today. Continue Lopressor '25mg'$  BID and Losartan '50mg'$  daily.   CKD stage 3-4 He follows with nephrology. Scr baseline around 2. BMET today as above.   Disposition: Follow up in 3 month(s) with Md    Signed, [place], PA-C  01/10/2022 11:24 AM    [place]

## 2022-01-14 ENCOUNTER — Other Ambulatory Visit: Payer: Self-pay

## 2022-01-14 DIAGNOSIS — I5033 Acute on chronic diastolic (congestive) heart failure: Secondary | ICD-10-CM

## 2022-02-09 ENCOUNTER — Other Ambulatory Visit: Payer: Self-pay | Admitting: Cardiovascular Disease

## 2022-03-17 ENCOUNTER — Telehealth: Payer: Self-pay | Admitting: Medical

## 2022-03-17 NOTE — Telephone Encounter (Signed)
Returned call to patient. He clarified that he is requesting a refill for his iron supplement (not vitamin D).  Patient insists his iron supplement was supposed to be refilled at visit with Cadence Jorene Minors in June 2023. I did not see any mention of refilling iron supplement in note, and informed patient the PCP is usually responsible for filling this kind of medication.  Encouraged patient to contact his PCP to request refill on iron supplement.  Patient verbalized understanding.

## 2022-03-17 NOTE — Telephone Encounter (Signed)
   Pt c/o medication issue:  1. Name of Medication: vit. D  2. How are you currently taking this medication (dosage and times per day)?   3. Are you having a reaction (difficulty breathing--STAT)?   4. What is your medication issue? Pt said, Cadence called in Vit. D prescription for him. He needs refill, its not on his med list. His pharmacy is [facility]

## 2022-03-18 ENCOUNTER — Other Ambulatory Visit: Payer: Self-pay | Admitting: *Deleted

## 2022-03-18 ENCOUNTER — Ambulatory Visit (INDEPENDENT_AMBULATORY_CARE_PROVIDER_SITE_OTHER): Payer: Medicare Other | Admitting: Internal Medicine

## 2022-03-18 ENCOUNTER — Other Ambulatory Visit: Payer: Self-pay | Admitting: Cardiovascular Disease

## 2022-03-18 ENCOUNTER — Encounter: Payer: Self-pay | Admitting: Internal Medicine

## 2022-03-18 VITALS — BP 118/68 | HR 63 | Ht 70.0 in | Wt 256.9 lb

## 2022-03-18 DIAGNOSIS — D509 Iron deficiency anemia, unspecified: Secondary | ICD-10-CM | POA: Diagnosis not present

## 2022-03-18 DIAGNOSIS — Z794 Long term (current) use of insulin: Secondary | ICD-10-CM

## 2022-03-18 DIAGNOSIS — E782 Mixed hyperlipidemia: Secondary | ICD-10-CM | POA: Diagnosis not present

## 2022-03-18 DIAGNOSIS — N183 Chronic kidney disease, stage 3 unspecified: Secondary | ICD-10-CM | POA: Diagnosis not present

## 2022-03-18 DIAGNOSIS — I10 Essential (primary) hypertension: Secondary | ICD-10-CM | POA: Diagnosis not present

## 2022-03-18 DIAGNOSIS — I2510 Atherosclerotic heart disease of native coronary artery without angina pectoris: Secondary | ICD-10-CM

## 2022-03-18 DIAGNOSIS — E119 Type 2 diabetes mellitus without complications: Secondary | ICD-10-CM | POA: Diagnosis not present

## 2022-03-18 DIAGNOSIS — I25118 Atherosclerotic heart disease of native coronary artery with other forms of angina pectoris: Secondary | ICD-10-CM | POA: Diagnosis not present

## 2022-03-18 DIAGNOSIS — E1122 Type 2 diabetes mellitus with diabetic chronic kidney disease: Secondary | ICD-10-CM | POA: Diagnosis not present

## 2022-03-18 DIAGNOSIS — J449 Chronic obstructive pulmonary disease, unspecified: Secondary | ICD-10-CM

## 2022-03-18 LAB — GLUCOSE, POCT (MANUAL RESULT ENTRY): POC Glucose: 117 mg/dl — AB (ref 70–99)

## 2022-03-18 MED ORDER — POLYSACCHARIDE IRON COMPLEX 150 MG PO CAPS: 150.0000 mg | ORAL_CAPSULE | Freq: Every day | ORAL | 1 refills | Status: DC

## 2022-03-18 NOTE — Assessment & Plan Note (Signed)
Hypercholesterolemia  I advised the patient to follow Mediterranean diet This diet is rich in fruits vegetables and whole grain, and This diet is also rich in fish and lean meat Patient should also eat a handful of almonds or walnuts daily Recent heart study indicated that average follow-up on this kind of diet reduces the cardiovascular mortality by 50 to 70%== 

## 2022-03-18 NOTE — Assessment & Plan Note (Signed)
Stable at the present time patient denies any chest pain chest decreased breath sound without any rales or rhonchi.

## 2022-03-18 NOTE — Addendum Note (Signed)
Addended by: Alois Cliche on: 03/18/2022 12:02 PM   Modules accepted: Orders

## 2022-03-18 NOTE — Assessment & Plan Note (Signed)
Blood pressure is under control 

## 2022-03-18 NOTE — Assessment & Plan Note (Signed)
Refer to nephrologist 

## 2022-03-18 NOTE — Assessment & Plan Note (Signed)
Patient last CT was reviewed which was done in January, he was advised to quit smoking completely

## 2022-03-18 NOTE — Progress Notes (Signed)
Established Patient Office Visit  Subjective:  Patient ID: Joshua Roy, male    DOB: 01/20/1951  Age: 71 y.o. MRN: [id number]  CC:  Chief Complaint  Patient presents with   Fatigue    Patient feels like he has not energy.Feels like he falls asleep every time he sets still. Having some edema in feet. Was prescribed an iron supplement in January but never got it. Also patient has seen his cardiologist for this.     HPI  Joshua Roy presents for tiredness and fatigue.  He is anemic renal insufficiency we will check kidney function again for anemia we will send him to hematologist he denies any chest pain or shortness of breath but complains of fatigue  Past Medical History:  Diagnosis Date   (HFpEF) heart failure with preserved ejection fraction ([place])    a. 07/2019 Echo: EF 60-65%. Mod LVH. Gr1 DD. No rwma. Nl RV size/fxn. Mildly dil LA; b. 11/2019 Echo: EF 55-60%, no rwma, mild LVH. Nl RV size/fxn. Mod dil LA.   ACE-inhibitor cough    Anxiety    Bronchitis 06/27/2016   Carotid arterial disease ([place] Roy)    a. 09/2019 Carotid U/S: RICA 16-55%, LICA 3-74%; b. 02/2706 R CEA.   CKD (chronic kidney disease), stage III (HCC)    COPD (chronic obstructive pulmonary disease) (HCC)    cath, mild inf. hypokinesis EF 49%, 100% ROA   Coronary artery disease    a. 2001 Cath: LM 10, LAD 60p/m, D1 30, LCX 20p, RCA 119md-->Med Rx; b. 05/2017 Ex Mv: Ex time 5:46, antlat twi @ rest, fixed infarct w/ mild peri-infarct ischemia. EF 38%; c. 08/2019 Cath: LM 40ost, 70d, LAD 731mLCX 8048mCA 80-90p, 100m21m 09/2019 CABG x4: LIMA->LAD, VG->RI, VG->OM, VG->RPDA.   Diabetes mellitus type II    Hyperlipidemia    Hypertension    MI (myocardial infarction) ([place] Roy 48  73A (osteoarthritis)    knee OA, injected 2012 by ortho   PAF (paroxysmal atrial fibrillation) (HCC)Joshua Roy a. 09/2019 post-op CABG-->Amio.   Pneumonia    PSVT (paroxysmal supraventricular tachycardia) (HCC)Joshua Roy a. 08/2019 Zio: 189  brief runs of SVT (fastest 174 x 6 beats, longest 15 beats @ 107).   Tobacco abuse     Past Surgical History:  Procedure Laterality Date   CARDIAC CATHETERIZATION  05/06/2000   [facility] N/A 04/20/2018   Procedure: COLONOSCOPY WITH PROPOFOL;  Surgeon: WohlLucilla Lame;  [facility];  Service: Endoscopy;  Laterality: N/A;   CORONARY ARTERY BYPASS GRAFT N/A 10/07/2019   Procedure: CORONARY ARTERY BYPASS GRAFTING (CABG) using LIMA to LAD; Endoscopic harvesting of left greater saphenous vein: SVG to RAMUS; SVG to OM1; SVG to PDA.;  Surgeon: BartGaye Pollack;  [facility]  Service: Open Heart Surgery;  Laterality: N/A;   ENDARTERECTOMY Right 10/07/2019   Procedure: ENDARTERECTOMY CAROTID;  Surgeon: EarlRosetta Posner;  [facility]  Service: Vascular;  Laterality: Right;   ENDOVEIN HARVEST OF GREATER SAPHENOUS VEIN Left 10/07/2019   Procedure: EndoCharleston RopesGreater Saphenous Vein;  Surgeon: BartGaye Pollack;  [facility]  Service: Open Heart Surgery;  Laterality: Left;   ESOPHAGOGASTRODUODENOSCOPY ENDOSCOPY     KNEE ARTHROSCOPY  07/25/04   left   [place], open surgery- repair   RIGHT/LEFT HEART CATH AND CORONARY ANGIOGRAPHY Bilateral 09/08/2019   Procedure: RIGHT/LEFT HEART CATH  AND CORONARY ANGIOGRAPHY;  Surgeon: Minna Merritts, MD;  Location: [place] [facility];  Service: Cardiovascular;  Laterality: Bilateral;   TEE WITHOUT CARDIOVERSION N/A 10/07/2019   Procedure: TRANSESOPHAGEAL ECHOCARDIOGRAM (TEE);  Surgeon: Gaye Pollack, MD;  Location: [place] [facility];  Service: Open Heart Surgery;  Laterality: N/A;   TOTAL KNEE ARTHROPLASTY Left 07/14/2016   Procedure: LEFT TOTAL KNEE ARTHROPLASTY;  Surgeon: Gaynelle Arabian, MD;  Location: [facility];  Service: Orthopedics;  Laterality: Left;   TOTAL KNEE ARTHROPLASTY Right 07/13/2017   Procedure: RIGHT TOTAL KNEE ARTHROPLASTY;  Surgeon: Gaynelle Arabian, MD;  Location: [facility];   Service: Orthopedics;  Laterality: Right;    Family History  Problem Relation Age of Onset   Heart disease Father        CAD, MI x 3   Hypertension Father    Alcohol abuse Father    Diabetes Father    Diabetes Sister    Cancer Brother        lymphoma, in remission   Colon cancer Brother    Heart disease Sister        CABG x 3   Prostate cancer Neg Hx     Social History   Socioeconomic History   Marital status: Married    Spouse name: Joshua Roy   Number of children: 3   Years of education: Not on file   Highest education level: Not on file  Occupational History   Occupation: Engineer, manufacturing, Estate agent  Tobacco Use   Smoking status: Every Day    Packs/day: 1.00    Years: 52.00    Total pack years: 52.00    Types: Cigarettes   Smokeless tobacco: Never   Tobacco comments:    per patient wears a patch and has cut down on cigarettes/day (10/05/2019)  Vaping Use   Vaping Use: Never used  Substance and Sexual Activity   Alcohol use: Yes    Alcohol/week: 0.0 standard drinks of alcohol    Comment: 5-6 cocktails, 1 times a week   Drug use: No   Sexual activity: Not on file  Other Topics Concern   Not on file  Social History Narrative   From [place].  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   Social Determinants of Health   Financial Resource Strain: Low Risk  (06/14/2021)   Overall Financial Resource Strain (CARDIA)    Difficulty of Paying Living Expenses: Not hard at all  Food Insecurity: No Food Insecurity (04/27/2020)   Hunger Vital Sign    Worried About Running Out of Food in the Last Year: Never true    Ran Out of Food in the Last Year: Never true  Transportation Needs: No Transportation Needs (06/14/2021)   PRAPARE - Hydrologist (Medical): No    Lack of Transportation (Non-Medical): No  Physical Activity: Insufficiently Active (06/14/2021)   Exercise Vital Sign    Days of Exercise per Week: 2 days    Minutes of Exercise  per Session: 20 min  Stress: No Stress Concern Present (06/14/2021)   [place] Roy    Feeling of Stress : Not at all  Social Connections: Moderately Isolated (06/14/2021)   Social Connection and Isolation Panel [NHANES]    Frequency of Communication with Friends and Family: More than three times a week    Frequency of Social Gatherings with Friends and Family: More than three times a week    Attends Religious Services:  Never    Active Member of Clubs or Organizations: No    Attends Archivist Meetings: Never    Marital Status: Married  Human resources officer Violence: Not At Risk (06/14/2021)   Humiliation, Afraid, Rape, and Kick questionnaire    Fear of Current or Ex-Partner: No    Emotionally Abused: No    Physically Abused: No    Sexually Abused: No     Current Outpatient Medications:    ALPRAZolam (XANAX) 0.5 MG tablet, Take 1 tablet (0.5 mg total) by mouth at bedtime as needed for anxiety., Disp: 30 tablet, Rfl: 0   atorvastatin (LIPITOR) 80 MG tablet, Take 1 tablet (80 mg total) by mouth daily., Disp: 90 tablet, Rfl: 3   clopidogrel (PLAVIX) 75 MG tablet, Take 1 tablet (75 mg total) by mouth daily., Disp: 90 tablet, Rfl: 3   FARXIGA 10 MG TABS tablet, Take 10 mg by mouth daily., Disp: , Rfl:    fenofibrate 160 MG tablet, Take 1 tablet (160 mg total) by mouth daily., Disp: 90 tablet, Rfl: 3   furosemide (LASIX) 40 MG tablet, Take 2 tablets (80 mg total) by mouth 2 (two) times daily. (Patient taking differently: Take 40 mg by mouth daily.), Disp: 180 tablet, Rfl: 3   glimepiride (AMARYL) 2 MG tablet, TAKE 1/2 TABLET BY MOUTH IN THE MORNING (Patient taking differently: Take 1 mg by mouth daily with breakfast.), Disp: 45 tablet, Rfl: 7   glucose blood (TRUE METRIX BLOOD GLUCOSE TEST) test strip, 1 each by Other route daily. Use as instructed, Disp: 100 each, Rfl: 12   iron polysaccharides (NIFEREX) 150 MG capsule,  Take 1 capsule (150 mg total) by mouth daily., Disp: 30 capsule, Rfl: 0   losartan (COZAAR) 50 MG tablet, Take 50 mg by mouth daily., Disp: , Rfl:    metFORMIN (GLUCOPHAGE) 1000 MG tablet, TAKE 1 TABLET BY MOUTH TWICE A DAY, Disp: 180 tablet, Rfl: 6   metoprolol tartrate (LOPRESSOR) 25 MG tablet, Take 1 tablet (25 mg total) by mouth 2 (two) times daily., Disp: 180 tablet, Rfl: 3   nitroGLYCERIN (NITROSTAT) 0.4 MG SL tablet, Place 0.4 mg under the tongue every 5 (five) minutes as needed for chest pain., Disp: , Rfl:    pantoprazole (PROTONIX) 40 MG tablet, TAKE 1 TABLET BY MOUTH DAILY BEFORE BREAKFAST, Disp: 90 tablet, Rfl: 3   sildenafil (REVATIO) 20 MG tablet, TAKE ONE TABLET BY MOUTH DAILY AS NEEDED, Disp: 30 tablet, Rfl: 6   traZODone (DESYREL) 50 MG tablet, TAKE 1 TABLET BY MOUTH EVERY DAY, Disp: 90 tablet, Rfl: 1   Allergies  Allergen Reactions   Ramipril Cough and Other (See Comments)    REACTION: Cough   Pseudoephedrine-Guaifenesin Er Other (See Comments) and Hypertension    Elevated BP, insomnia Elevated BP, insomnia    ROS Review of Systems  Constitutional:  Positive for fatigue. Negative for appetite change and chills.  HENT: Negative.  Negative for congestion and ear discharge.   Eyes: Negative.   Respiratory: Negative.  Negative for choking.   Cardiovascular: Negative.   Gastrointestinal: Negative.   Endocrine: Negative.   Genitourinary: Negative.   Musculoskeletal: Negative.   Skin: Negative.   Allergic/Immunologic: Negative.   Neurological: Negative.   Hematological: Negative.   Psychiatric/Behavioral: Negative.    All other systems reviewed and are negative.     Objective:    Physical Exam Vitals reviewed.  Constitutional:      Appearance: Normal appearance.  HENT:     Mouth/Throat:  Mouth: Mucous membranes are moist.  Eyes:     Pupils: Pupils are equal, round, and reactive to light.  Neck:     Vascular: No carotid bruit.  Cardiovascular:     Rate  and Rhythm: Normal rate and regular rhythm.     Pulses: Normal pulses.     Heart sounds: Normal heart sounds.  Pulmonary:     Effort: Pulmonary effort is normal.     Breath sounds: Normal breath sounds.  Abdominal:     General: Bowel sounds are normal.     Palpations: Abdomen is soft. There is no hepatomegaly, splenomegaly or mass.     Tenderness: There is no abdominal tenderness.     Hernia: No hernia is present.  Musculoskeletal:     Cervical back: Neck supple.     Right lower leg: No edema.     Left lower leg: No edema.  Skin:    Findings: No rash.  Neurological:     Mental Status: He is alert and oriented to person, place, and time.     Motor: No weakness.  Psychiatric:        Mood and Affect: Mood normal.        Behavior: Behavior normal.     BP 118/68   Pulse 63   Ht 5' 10"  (1.778 m)   Wt 256 lb 14.4 oz (116.5 kg)   BMI 36.86 kg/m  Wt Readings from Last 3 Encounters:  03/18/22 256 lb 14.4 oz (116.5 kg)  01/10/22 250 lb (113.4 kg)  12/25/21 253 lb 6 oz (114.9 kg)     Health Maintenance Due  Topic Date Due   COVID-19 Vaccine (1) Never done   Zoster Vaccines- Shingrix (1 of 2) Never done   OPHTHALMOLOGY EXAM  10/26/2021   FOOT EXAM  02/11/2022   INFLUENZA VACCINE  02/25/2022    There are no preventive care reminders to display for this patient.  Lab Results  Component Value Date   TSH 1.66 06/12/2020   Lab Results  Component Value Date   WBC 6.6 08/09/2021   HGB 10.7 (L) 08/09/2021   HCT 36.7 (L) 08/09/2021   MCV 76.6 (L) 08/09/2021   PLT 373 08/09/2021   Lab Results  Component Value Date   NA 133 (L) 01/10/2022   K 4.8 01/10/2022   CO2 32 01/10/2022   GLUCOSE 131 (H) 01/10/2022   BUN 43 (H) 01/10/2022   CREATININE 2.37 (H) 01/10/2022   BILITOT 0.3 06/13/2021   ALKPHOS 25 (L) 12/07/2019   AST 14 06/13/2021   ALT 12 06/13/2021   PROT 6.3 06/13/2021   ALBUMIN 3.8 12/07/2019   CALCIUM 9.0 01/10/2022   ANIONGAP 10 01/10/2022   EGFR 30 (L)  06/13/2021   GFR 65.78 11/15/2014   Lab Results  Component Value Date   CHOL 124 06/13/2021   Lab Results  Component Value Date   HDL 39 (L) 06/13/2021   Lab Results  Component Value Date   LDLCALC 63 06/13/2021   Lab Results  Component Value Date   TRIG 138 06/13/2021   Lab Results  Component Value Date   CHOLHDL 3.2 06/13/2021   Lab Results  Component Value Date   HGBA1C 7.0 (A) 12/20/2021      Assessment & Plan:   Problem List Items Addressed This Visit       Cardiovascular and Mediastinum   Essential hypertension    Blood pressure is under control      CAD (coronary artery disease)  Stable at the present time patient denies any chest pain chest decreased breath sound without any rales or rhonchi.        Respiratory   COPD (chronic obstructive pulmonary disease) ([place])    Patient last CT was reviewed which was done in January, he was advised to quit smoking completely        Endocrine   T2DM (type 2 diabetes mellitus) ([place]) - Primary   Relevant Orders   POCT glucose (manual entry) (Completed)   CKD stage 3 due to type 2 diabetes mellitus ([place])    Refer to nephrologist        Other   Hyperlipidemia    Hypercholesterolemia  I advised the patient to follow Mediterranean diet This diet is rich in fruits vegetables and whole grain, and This diet is also rich in fish and lean meat Patient should also eat a handful of almonds or walnuts daily Recent heart study indicated that average follow-up on this kind of diet reduces the cardiovascular mortality by 50 to 70%==       No orders of the defined types were placed in this encounter.   Follow-up: No follow-ups on file.    [name], MD

## 2022-03-19 LAB — COMPLETE METABOLIC PANEL WITH GFR
AG Ratio: 1.8 (calc) (ref 1.0–2.5)
ALT: 16 U/L (ref 9–46)
AST: 18 U/L (ref 10–35)
Albumin: 4.2 g/dL (ref 3.6–5.1)
Alkaline phosphatase (APISO): 31 U/L — ABNORMAL LOW (ref 35–144)
BUN/Creatinine Ratio: 19 (calc) (ref 6–22)
BUN: 49 mg/dL — ABNORMAL HIGH (ref 7–25)
CO2: 29 mmol/L (ref 20–32)
Calcium: 9.8 mg/dL (ref 8.6–10.3)
Chloride: 96 mmol/L — ABNORMAL LOW (ref 98–110)
Creat: 2.6 mg/dL — ABNORMAL HIGH (ref 0.70–1.28)
Globulin: 2.4 g/dL (calc) (ref 1.9–3.7)
Glucose, Bld: 79 mg/dL (ref 65–99)
Potassium: 5.5 mmol/L — ABNORMAL HIGH (ref 3.5–5.3)
Sodium: 139 mmol/L (ref 135–146)
Total Bilirubin: 0.6 mg/dL (ref 0.2–1.2)
Total Protein: 6.6 g/dL (ref 6.1–8.1)
eGFR: 25 mL/min/{1.73_m2} — ABNORMAL LOW (ref >=60)

## 2022-03-19 LAB — IRON,TIBC AND FERRITIN PANEL
%SAT: 4 % (calc) — ABNORMAL LOW (ref 20–48)
Ferritin: 10 ng/mL — ABNORMAL LOW (ref 24–380)
Iron: 22 ug/dL — ABNORMAL LOW (ref 50–180)
TIBC: 613 mcg/dL (calc) — ABNORMAL HIGH (ref 250–425)

## 2022-03-24 ENCOUNTER — Telehealth: Payer: Self-pay | Admitting: *Deleted

## 2022-03-24 ENCOUNTER — Inpatient Hospital Stay: Payer: Medicare Other

## 2022-03-24 ENCOUNTER — Inpatient Hospital Stay: Payer: Medicare Other | Attending: Internal Medicine | Admitting: Internal Medicine

## 2022-03-24 ENCOUNTER — Encounter: Payer: Self-pay | Admitting: Internal Medicine

## 2022-03-24 VITALS — BP 113/72 | HR 62 | Temp 97.8°F | Resp 18 | Wt 266.0 lb

## 2022-03-24 DIAGNOSIS — I480 Paroxysmal atrial fibrillation: Secondary | ICD-10-CM | POA: Insufficient documentation

## 2022-03-24 DIAGNOSIS — Z951 Presence of aortocoronary bypass graft: Secondary | ICD-10-CM | POA: Diagnosis not present

## 2022-03-24 DIAGNOSIS — Z86010 Personal history of colonic polyps: Secondary | ICD-10-CM | POA: Diagnosis not present

## 2022-03-24 DIAGNOSIS — E1122 Type 2 diabetes mellitus with diabetic chronic kidney disease: Secondary | ICD-10-CM | POA: Insufficient documentation

## 2022-03-24 DIAGNOSIS — Z807 Family history of other malignant neoplasms of lymphoid, hematopoietic and related tissues: Secondary | ICD-10-CM | POA: Diagnosis not present

## 2022-03-24 DIAGNOSIS — N183 Chronic kidney disease, stage 3 unspecified: Secondary | ICD-10-CM | POA: Diagnosis not present

## 2022-03-24 DIAGNOSIS — F17210 Nicotine dependence, cigarettes, uncomplicated: Secondary | ICD-10-CM | POA: Insufficient documentation

## 2022-03-24 DIAGNOSIS — D509 Iron deficiency anemia, unspecified: Secondary | ICD-10-CM | POA: Diagnosis not present

## 2022-03-24 DIAGNOSIS — E785 Hyperlipidemia, unspecified: Secondary | ICD-10-CM | POA: Diagnosis not present

## 2022-03-24 DIAGNOSIS — I130 Hypertensive heart and chronic kidney disease with heart failure and stage 1 through stage 4 chronic kidney disease, or unspecified chronic kidney disease: Secondary | ICD-10-CM | POA: Diagnosis not present

## 2022-03-24 DIAGNOSIS — Z800 Family history of malignant neoplasm of digestive organs: Secondary | ICD-10-CM | POA: Diagnosis not present

## 2022-03-24 DIAGNOSIS — I2510 Atherosclerotic heart disease of native coronary artery without angina pectoris: Secondary | ICD-10-CM | POA: Diagnosis not present

## 2022-03-24 DIAGNOSIS — I5030 Unspecified diastolic (congestive) heart failure: Secondary | ICD-10-CM | POA: Diagnosis not present

## 2022-03-24 LAB — CBC WITH DIFFERENTIAL/PLATELET
Abs Immature Granulocytes: 0.02 10*3/uL (ref 0.00–0.07)
Basophils Absolute: 0.1 10*3/uL (ref 0.0–0.1)
Basophils Relative: 1 %
Eosinophils Absolute: 0 10*3/uL (ref 0.0–0.5)
Eosinophils Relative: 0 %
HCT: 40.3 % (ref 39.0–52.0)
Hemoglobin: 11.8 g/dL — ABNORMAL LOW (ref 13.0–17.0)
Immature Granulocytes: 0 %
Lymphocytes Relative: 21 %
Lymphs Abs: 1.5 10*3/uL (ref 0.7–4.0)
MCH: 22.5 pg — ABNORMAL LOW (ref 26.0–34.0)
MCHC: 29.3 g/dL — ABNORMAL LOW (ref 30.0–36.0)
MCV: 76.9 fL — ABNORMAL LOW (ref 80.0–100.0)
Monocytes Absolute: 0.8 10*3/uL (ref 0.1–1.0)
Monocytes Relative: 11 %
Neutro Abs: 4.9 10*3/uL (ref 1.7–7.7)
Neutrophils Relative %: 67 %
Platelets: 273 10*3/uL (ref 150–400)
RBC: 5.24 MIL/uL (ref 4.22–5.81)
RDW: 18.6 % — ABNORMAL HIGH (ref 11.5–15.5)
WBC: 7.3 10*3/uL (ref 4.0–10.5)
nRBC: 0 % (ref 0.0–0.2)

## 2022-03-24 LAB — SAMPLE TO BLOOD BANK

## 2022-03-24 NOTE — Progress Notes (Addendum)
[place]  [phone] [phone]  ID: Joshua Roy OB: 1950-10-05  MR#: [id number]  XHB#:[id number]  Patient Care Team: [name], MD as PCP - General (Internal Medicine) Rockey Situ Kathlene November, MD as Consulting Physician (Cardiology)  REFERRING PROVIDER: Dr. [name]  REASON FOR REFERRAL: iron deficiency anemia   HPI: Joshua Roy is a 71 y.o. male with past medical history of HFpEF, CAD s/p CABG, diabetes, hyperlipidemia and A-fib was referred to hematology clinic for further management of iron deficiency anemia.  Patient reports shortness of breath on exertion and extreme fatigue for past 1 week.  He was in the wheelchair which he said usual.  He has occasional dizziness.  He started oral iron last Wednesday.  Denies any bleeding in stool, urine, or nose bleeding.  Denies any prior history of iron deficiency.  Patient denies fever, chills, nausea, vomiting, cough, abdominal pain, bowel or bladder issues.  Labs reviewed.  Iron panel showed ferritin of 10.  CBC not performed. Last colonoscopy was in 2019 which showed tubular adenoma in ascending descending and sigmoid colon.  He was due for repeat colonoscopy last year however he misunderstood about stopping his blood thinner as a result he could not get the procedure.  REVIEW OF SYSTEMS:   ROS  As per HPI. Otherwise, a complete review of systems is negative.  PAST MEDICAL HISTORY: Past Medical History:  Diagnosis Date   (HFpEF) heart failure with preserved ejection fraction ([place])    a. 07/2019 Echo: EF 60-65%. Mod LVH. Gr1 DD. No rwma. Nl RV size/fxn. Mildly dil LA; b. 11/2019 Echo: EF 55-60%, no rwma, mild LVH. Nl RV size/fxn. Mod dil LA.   ACE-inhibitor cough    Anxiety    Bronchitis 06/27/2016   Carotid arterial disease ([place])    a. 09/2019 Carotid U/S: RICA 81-01%, LICA 7-51%; b. 0/2585 R CEA.   CKD (chronic kidney disease), stage III (HCC)    COPD (chronic obstructive  pulmonary disease) (HCC)    cath, mild inf. hypokinesis EF 49%, 100% ROA   Coronary artery disease    a. 2001 Cath: LM 10, LAD 60p/m, D1 30, LCX 20p, RCA 161md-->Med Rx; b. 05/2017 Ex Mv: Ex time 5:46, antlat twi @ rest, fixed infarct w/ mild peri-infarct ischemia. EF 38%; c. 08/2019 Cath: LM 40ost, 70d, LAD 719mLCX 8047mCA 80-90p, 100m33m 09/2019 CABG x4: LIMA->LAD, VG->RI, VG->OM, VG->RPDA.   Diabetes mellitus type II    Hyperlipidemia    Hypertension    [facility] (osteoarthritis)    knee OA, injected 2012 by ortho   PAF (paroxysmal atrial fibrillation) (HCC)[place] a. 09/2019 post-op CABG-->Amio.   Pneumonia    PSVT (paroxysmal supraventricular tachycardia) (HCC)[place] a. 08/2019 Zio: 189 brief runs of SVT (fastest 174 x 6 beats, longest 15 beats @ 107).   Tobacco abuse     PAST SURGICAL HISTORY: Past Surgical History:  Procedure Laterality Date   CARDIAC CATHETERIZATION  05/06/2000   [place]/A 04/20/2018   Procedure: COLONOSCOPY WITH PROPOFOL;  Surgeon: WohlLucilla Lame;  [facility];  Service: Endoscopy;  Laterality: N/A;   CORONARY ARTERY BYPASS GRAFT N/A 10/07/2019   Procedure: CORONARY ARTERY BYPASS GRAFTING (CABG) using LIMA to LAD; Endoscopic harvesting of left greater saphenous vein: SVG to RAMUS; SVG to OM1; SVG to PDA.;  Surgeon: BartGaye Pollack;  [facility]  Open Heart Surgery;  Laterality: N/A;   ENDARTERECTOMY Right 10/07/2019   Procedure: ENDARTERECTOMY CAROTID;  Surgeon: Rosetta Posner, MD;  Location: [facility] OR;  Service: Vascular;  Laterality: Right;   ENDOVEIN HARVEST OF GREATER SAPHENOUS VEIN Left 10/07/2019   Procedure: [place] Of Greater Saphenous Vein;  Surgeon: Gaye Pollack, MD;  Location[facility] OR;  Service: Open Heart Surgery;  Laterality: Left;   ESOPHAGOGASTRODUODENOSCOPY ENDOSCOPY     KNEE ARTHROSCOPY  07/25/04   left   [place]   right, open surgery-  repair   RIGHT/LEFT HEART CATH AND CORONARY ANGIOGRAPHY Bilateral 09/08/2019   Procedure: RIGHT/LEFT HEART CATH AND CORONARY ANGIOGRAPHY;  Surgeon: Minna Merritts, MD;  Location: [place] CV LAB;  Service: Cardiovascular;  Laterality: Bilateral;   TEE WITHOUT CARDIOVERSION N/A 10/07/2019   Procedure: TRANSESOPHAGEAL ECHOCARDIOGRAM (TEE);  Surgeon: Gaye Pollack, MD;  Location: [place];  Service: Open Heart Surgery;  Laterality: N/A;   TOTAL KNEE ARTHROPLASTY Left 07/14/2016   Procedure: LEFT TOTAL KNEE ARTHROPLASTY;  Surgeon: Gaynelle Arabian, MD;  Location: [facility] ORS;  Service: Orthopedics;  Laterality: Left;   TOTAL KNEE ARTHROPLASTY Right 07/13/2017   Procedure: RIGHT TOTAL KNEE ARTHROPLASTY;  Surgeon: Gaynelle Arabian, MD;  Location: [facility] ORS;  Service: Orthopedics;  Laterality: Right;    FAMILY HISTORY: Family History  Problem Relation Age of Onset   Heart disease Father        CAD, MI x 3   Hypertension Father    Alcohol abuse Father    Diabetes Father    Diabetes Sister    Cancer Brother        lymphoma, in remission   Colon cancer Brother    Heart disease Sister        CABG x 3   Prostate cancer Neg Hx     HEALTH MAINTENANCE: Social History   Tobacco Use   Smoking status: Every Day    Packs/day: 1.00    Years: 52.00    Total pack years: 52.00    Types: Cigarettes   Smokeless tobacco: Never   Tobacco comments:    per patient wears a patch and has cut down on cigarettes/day (10/05/2019)  Vaping Use   Vaping Use: Never used  Substance Use Topics   Alcohol use: Yes    Alcohol/week: 0.0 standard drinks of alcohol    Comment: 5-6 cocktails, 1 times a week   Drug use: No     Allergies  Allergen Reactions   Ramipril Cough and Other (See Comments)    REACTION: Cough   Pseudoephedrine-Guaifenesin Er Other (See Comments) and Hypertension    Elevated BP, insomnia Elevated BP, insomnia    Current Outpatient Medications  Medication Sig Dispense Refill   ALPRAZolam  (XANAX) 0.5 MG tablet Take 1 tablet (0.5 mg total) by mouth at bedtime as needed for anxiety. 30 tablet 0   atorvastatin (LIPITOR) 80 MG tablet TAKE 1 TABLET BY MOUTH EVERY DAY 90 tablet 1   clopidogrel (PLAVIX) 75 MG tablet Take 1 tablet (75 mg total) by mouth daily. 90 tablet 3   fenofibrate 160 MG tablet Take 1 tablet (160 mg total) by mouth daily. 90 tablet 3   furosemide (LASIX) 40 MG tablet Take 2 tablets (80 mg total) by mouth 2 (two) times daily. (Patient taking differently: Take 40 mg by mouth daily.) 180 tablet 3   glimepiride (AMARYL) 2 MG tablet TAKE 1/2 TABLET BY MOUTH IN THE MORNING (Patient taking differently: Take 1  mg by mouth daily with breakfast.) 45 tablet 7   glucose blood (TRUE METRIX BLOOD GLUCOSE TEST) test strip 1 each by Other route daily. Use as instructed 100 each 12   iron polysaccharides (NIFEREX) 150 MG capsule Take 1 capsule (150 mg total) by mouth daily. 90 capsule 1   losartan (COZAAR) 50 MG tablet Take 50 mg by mouth daily.     metFORMIN (GLUCOPHAGE) 1000 MG tablet TAKE 1 TABLET BY MOUTH TWICE A DAY 180 tablet 6   metoprolol tartrate (LOPRESSOR) 25 MG tablet Take 1 tablet (25 mg total) by mouth 2 (two) times daily. 180 tablet 3   nitroGLYCERIN (NITROSTAT) 0.4 MG SL tablet Place 0.4 mg under the tongue every 5 (five) minutes as needed for chest pain.     pantoprazole (PROTONIX) 40 MG tablet TAKE 1 TABLET BY MOUTH DAILY BEFORE BREAKFAST 90 tablet 3   sildenafil (REVATIO) 20 MG tablet TAKE ONE TABLET BY MOUTH DAILY AS NEEDED 30 tablet 6   traZODone (DESYREL) 50 MG tablet TAKE 1 TABLET BY MOUTH EVERY DAY 90 tablet 1   No current facility-administered medications for this visit.    OBJECTIVE: Vitals:   03/24/22 1108 03/24/22 1109  BP:  113/72  Pulse:  62  Resp: 18   Temp:  97.8 F (36.6 C)  SpO2:  91%     Body mass index is 38.17 kg/m.      General: Well-developed, well-nourished, no acute distress. Eyes: Pink conjunctiva, anicteric sclera. HEENT:  Normocephalic, moist mucous membranes, clear oropharnyx. Lungs: Clear to auscultation bilaterally. Heart: Regular rate and rhythm. No rubs, murmurs, or gallops. Abdomen: Soft, nontender, nondistended. No organomegaly noted, normoactive bowel sounds. Musculoskeletal: No edema, cyanosis, or clubbing. Neuro: Alert, answering all questions appropriately. Cranial nerves grossly intact. Skin: No rashes or petechiae noted. Psych: Normal affect. Lymphatics: No cervical, calvicular, axillary or inguinal LAD.   LAB RESULTS:  Lab Results  Component Value Date   NA 139 03/18/2022   K 5.5 (H) 03/18/2022   CL 96 (L) 03/18/2022   CO2 29 03/18/2022   GLUCOSE 79 03/18/2022   BUN 49 (H) 03/18/2022   CREATININE 2.60 (H) 03/18/2022   CALCIUM 9.8 03/18/2022   PROT 6.6 03/18/2022   ALBUMIN 3.8 12/07/2019   AST 18 03/18/2022   ALT 16 03/18/2022   ALKPHOS 25 (L) 12/07/2019   BILITOT 0.6 03/18/2022   GFRNONAA 28 (L) 01/10/2022   GFRAA 39 (L) 06/12/2020    Lab Results  Component Value Date   WBC 7.3 03/24/2022   NEUTROABS 4.9 03/24/2022   HGB 11.8 (L) 03/24/2022   HCT 40.3 03/24/2022   MCV 76.9 (L) 03/24/2022   PLT 273 03/24/2022    Lab Results  Component Value Date   TIBC 613 (H) 03/18/2022   TIBC 559 (H) 08/09/2021   FERRITIN 10 (L) 03/18/2022   FERRITIN 25 08/13/2021   FERRITIN 19 (L) 08/12/2021   IRONPCTSAT 4 (L) 03/18/2022   IRONPCTSAT 5 (L) 08/09/2021     STUDIES: No results found.  ASSESSMENT AND PLAN:   Joshua Roy is a 71 y.o. male with pmh of HFpEF, CAD s/p CABG, diabetes, hyperlipidemia and A-fib was referred to hematology clinic for further management of iron deficiency anemia.  #Iron deficiency anemia  #Shortness of breath and fatigue #CKD stage 3 - Labs reviewed.  Iron panel showed ferritin of 10.  CBC not performed.  - Last colonoscopy was in 2019 which showed tubular adenoma in ascending, descending and sigmoid colon.  He was due for repeat colonoscopy  last year however he misunderstood about stopping his blood thinner as a result he could not get the procedure. Referral to GI.   -I will repeat CBC today with hold tube to blood bank.  Patient is symptomatic with shortness of breath and fatigue.  Requested patient to wait until CBC results to make sure he does not need blood transfusion. Patient is tolerating oral iron however he is symptomatic and will benefit for Feraheme IV 550 mg x 2 weekly for quicker response.  First dose to be scheduled this week.  Low but potential risk of anaphylactic reaction was discussed with the patient.  -check EPO level next time. If low, after replenishing iron stores will consider EPO injections.  -RTC in 8 weeks with labs prior.  Orders Placed This Encounter  Procedures   CBC with Differential   CBC with Differential   Ferritin   Iron and TIBC([facility])   Erythropoietin   Ambulatory referral to Gastroenterology   Hold Tube- Blood Bank   Hold Tube- Blood Bank   ADDENDUM-  CBC reviewed. Hb 11.8. The level of anemia does not explain SOB and evaluation of other causes to be considered. This was discussed with Dr. [name], patient's primary.   Patient expressed understanding and was in agreement with this plan. He also understands that He can call clinic at any time with any questions, concerns, or complaints.   I spent a total of 30 minutes reviewing chart data, face-to-face evaluation with the patient, counseling and coordination of care as detailed above.  Jane Canary, MD   03/24/2022 12:44 PM

## 2022-03-24 NOTE — Addendum Note (Signed)
Addended byJane Canary on: 03/24/2022 12:45 PM   Modules accepted: Orders, Level of Service

## 2022-03-24 NOTE — Telephone Encounter (Signed)
Call placed to patient to review cbc results from clinic visit today. Hgb 11.8, patient does not require blood transfusion at this time. Patient advised of results and need to keep scheduled follow up for iron infusions as scheduled. Patient verbalized understanding of plan. Encouraged to call with any needs or concerns.

## 2022-03-24 NOTE — Progress Notes (Signed)
Patient here today for initial visit regarding anemia. Patient reports weakness and fatigue, shortness of breath.

## 2022-03-25 DIAGNOSIS — N1832 Chronic kidney disease, stage 3b: Secondary | ICD-10-CM | POA: Diagnosis not present

## 2022-03-25 DIAGNOSIS — N179 Acute kidney failure, unspecified: Secondary | ICD-10-CM | POA: Diagnosis not present

## 2022-03-25 DIAGNOSIS — E875 Hyperkalemia: Secondary | ICD-10-CM | POA: Diagnosis not present

## 2022-03-25 DIAGNOSIS — R600 Localized edema: Secondary | ICD-10-CM | POA: Diagnosis not present

## 2022-03-25 DIAGNOSIS — E1122 Type 2 diabetes mellitus with diabetic chronic kidney disease: Secondary | ICD-10-CM | POA: Diagnosis not present

## 2022-03-26 ENCOUNTER — Encounter: Payer: Self-pay | Admitting: Internal Medicine

## 2022-03-26 ENCOUNTER — Ambulatory Visit
Admission: RE | Admit: 2022-03-26 | Discharge: 2022-03-26 | Disposition: A | Discharge status: Home / Self Care | Payer: Medicare Other | Source: Ambulatory Visit | Attending: Nephrology | Admitting: Nephrology

## 2022-03-26 VITALS — BP 122/60 | HR 58 | Temp 97.9°F | Resp 16

## 2022-03-26 DIAGNOSIS — N189 Chronic kidney disease, unspecified: Secondary | ICD-10-CM | POA: Diagnosis not present

## 2022-03-26 MED ORDER — FUROSEMIDE 10 MG/ML IJ SOLN
40.0000 mg | Freq: Once | INTRAMUSCULAR | Status: AC
Start: 2022-03-26 — End: 2022-03-26

## 2022-03-26 MED ORDER — FUROSEMIDE 10 MG/ML IJ SOLN
INTRAMUSCULAR | Status: AC
  Administered 2022-03-26: 40 mg via INTRAVENOUS
  Filled 2022-03-26: qty 4

## 2022-03-27 ENCOUNTER — Inpatient Hospital Stay: Payer: Medicare Other

## 2022-03-27 VITALS — BP 117/55 | HR 69 | Temp 97.5°F | Resp 18

## 2022-03-27 DIAGNOSIS — D509 Iron deficiency anemia, unspecified: Secondary | ICD-10-CM

## 2022-03-27 DIAGNOSIS — I2510 Atherosclerotic heart disease of native coronary artery without angina pectoris: Secondary | ICD-10-CM | POA: Diagnosis not present

## 2022-03-27 DIAGNOSIS — I5030 Unspecified diastolic (congestive) heart failure: Secondary | ICD-10-CM | POA: Diagnosis not present

## 2022-03-27 DIAGNOSIS — N183 Chronic kidney disease, stage 3 unspecified: Secondary | ICD-10-CM | POA: Diagnosis not present

## 2022-03-27 DIAGNOSIS — E1122 Type 2 diabetes mellitus with diabetic chronic kidney disease: Secondary | ICD-10-CM | POA: Diagnosis not present

## 2022-03-27 DIAGNOSIS — I130 Hypertensive heart and chronic kidney disease with heart failure and stage 1 through stage 4 chronic kidney disease, or unspecified chronic kidney disease: Secondary | ICD-10-CM | POA: Diagnosis not present

## 2022-03-27 MED ORDER — SODIUM CHLORIDE 0.9 % IV SOLN
Freq: Once | INTRAVENOUS | Status: AC
  Filled 2022-03-27: qty 250

## 2022-03-27 MED ORDER — SODIUM CHLORIDE 0.9 % IV SOLN
510.0000 mg | Freq: Once | INTRAVENOUS | Status: AC
  Administered 2022-03-27: 510 mg via INTRAVENOUS
  Filled 2022-03-27: qty 510

## 2022-03-27 NOTE — Patient Instructions (Signed)

## 2022-03-28 ENCOUNTER — Ambulatory Visit
Admission: RE | Admit: 2022-03-28 | Discharge: 2022-03-28 | Disposition: A | Discharge status: Home / Self Care | Payer: Medicare Other | Source: Ambulatory Visit | Attending: Nephrology | Admitting: Nephrology

## 2022-03-28 DIAGNOSIS — N189 Chronic kidney disease, unspecified: Secondary | ICD-10-CM | POA: Diagnosis not present

## 2022-03-28 DIAGNOSIS — E8770 Fluid overload, unspecified: Secondary | ICD-10-CM | POA: Diagnosis not present

## 2022-03-28 LAB — RENAL FUNCTION PANEL
Albumin: 3.8 g/dL (ref 3.5–5.0)
Anion gap: 10 (ref 5–15)
BUN: 82 mg/dL — ABNORMAL HIGH (ref 8–23)
CO2: 29 mmol/L (ref 22–32)
Calcium: 8.8 mg/dL — ABNORMAL LOW (ref 8.9–10.3)
Chloride: 97 mmol/L — ABNORMAL LOW (ref 98–111)
Creatinine, Ser: 5.45 mg/dL — ABNORMAL HIGH (ref 0.61–1.24)
GFR, Estimated: 10 mL/min — ABNORMAL LOW (ref >60)
Glucose, Bld: 67 mg/dL — ABNORMAL LOW (ref 70–99)
Phosphorus: 5.6 mg/dL — ABNORMAL HIGH (ref 2.5–4.6)
Potassium: 6 mmol/L — ABNORMAL HIGH (ref 3.5–5.1)
Sodium: 136 mmol/L (ref 135–145)

## 2022-03-28 MED ORDER — FUROSEMIDE 10 MG/ML IJ SOLN
INTRAMUSCULAR | Status: AC
  Administered 2022-03-28: 40 mg via INTRAVENOUS
  Filled 2022-03-28: qty 4

## 2022-03-28 MED ORDER — FUROSEMIDE 10 MG/ML IJ SOLN: 40.0000 mg | Freq: Once | INTRAMUSCULAR | Status: AC

## 2022-03-28 NOTE — Progress Notes (Signed)
Pt arrived for IV lasix and lab work. Pt not to have dyspnea on exertion. Pt placed on monitor and O2 sats noted to be between 84-89% on room air. Dr. Holley Raring notified. Dr. Holley Raring stated that if pt O2 sats did not improve after lasix that patient may need to be seen in the ER. Pt given the information but patient declined to go to the ER. Lasix given and upon recheck pt O2 sats improved to 92-93% on room air. Pt is able to check his O2 sats at home and this RN advised pt that if breathing became more labored or if O2 sats became worse that patient needed to come to be seen in the ER. IV discontinued. Patient taken in wheelchair to son who was awaiting to take the patient home.

## 2022-03-31 ENCOUNTER — Ambulatory Visit: Payer: Medicare Other

## 2022-04-01 ENCOUNTER — Other Ambulatory Visit: Payer: Self-pay

## 2022-04-01 ENCOUNTER — Inpatient Hospital Stay
Admission: EM | Admit: 2022-04-01 | Discharge: 2022-04-04 | DRG: 291 | Disposition: A | Discharge status: Home / Self Care | Payer: Medicare Other | Attending: Internal Medicine | Admitting: Internal Medicine

## 2022-04-01 ENCOUNTER — Ambulatory Visit: Admission: RE | Admit: 2022-04-01 | Payer: Medicare Other | Source: Ambulatory Visit

## 2022-04-01 ENCOUNTER — Inpatient Hospital Stay (HOSPITAL_COMMUNITY)
Admit: 2022-04-01 | Discharge: 2022-04-01 | Disposition: A | Discharge status: Home / Self Care | Payer: Medicare Other | Attending: Internal Medicine | Admitting: Internal Medicine

## 2022-04-01 ENCOUNTER — Emergency Department: Payer: Medicare Other

## 2022-04-01 DIAGNOSIS — J441 Chronic obstructive pulmonary disease with (acute) exacerbation: Secondary | ICD-10-CM | POA: Diagnosis present

## 2022-04-01 DIAGNOSIS — K219 Gastro-esophageal reflux disease without esophagitis: Secondary | ICD-10-CM | POA: Diagnosis present

## 2022-04-01 DIAGNOSIS — I480 Paroxysmal atrial fibrillation: Secondary | ICD-10-CM | POA: Diagnosis present

## 2022-04-01 DIAGNOSIS — J9601 Acute respiratory failure with hypoxia: Principal | ICD-10-CM | POA: Diagnosis present

## 2022-04-01 DIAGNOSIS — I509 Heart failure, unspecified: Secondary | ICD-10-CM | POA: Diagnosis not present

## 2022-04-01 DIAGNOSIS — J449 Chronic obstructive pulmonary disease, unspecified: Secondary | ICD-10-CM | POA: Diagnosis not present

## 2022-04-01 DIAGNOSIS — I130 Hypertensive heart and chronic kidney disease with heart failure and stage 1 through stage 4 chronic kidney disease, or unspecified chronic kidney disease: Principal | ICD-10-CM | POA: Diagnosis present

## 2022-04-01 DIAGNOSIS — D509 Iron deficiency anemia, unspecified: Secondary | ICD-10-CM | POA: Diagnosis present

## 2022-04-01 DIAGNOSIS — E1122 Type 2 diabetes mellitus with diabetic chronic kidney disease: Secondary | ICD-10-CM | POA: Diagnosis present

## 2022-04-01 DIAGNOSIS — Z951 Presence of aortocoronary bypass graft: Secondary | ICD-10-CM

## 2022-04-01 DIAGNOSIS — F17210 Nicotine dependence, cigarettes, uncomplicated: Secondary | ICD-10-CM | POA: Diagnosis not present

## 2022-04-01 DIAGNOSIS — Z807 Family history of other malignant neoplasms of lymphoid, hematopoietic and related tissues: Secondary | ICD-10-CM

## 2022-04-01 DIAGNOSIS — N179 Acute kidney failure, unspecified: Secondary | ICD-10-CM | POA: Diagnosis not present

## 2022-04-01 DIAGNOSIS — R778 Other specified abnormalities of plasma proteins: Secondary | ICD-10-CM | POA: Diagnosis present

## 2022-04-01 DIAGNOSIS — I110 Hypertensive heart disease with heart failure: Secondary | ICD-10-CM | POA: Diagnosis not present

## 2022-04-01 DIAGNOSIS — I25118 Atherosclerotic heart disease of native coronary artery with other forms of angina pectoris: Secondary | ICD-10-CM | POA: Diagnosis not present

## 2022-04-01 DIAGNOSIS — R9431 Abnormal electrocardiogram [ECG] [EKG]: Secondary | ICD-10-CM | POA: Diagnosis not present

## 2022-04-01 DIAGNOSIS — Z8616 Personal history of COVID-19: Secondary | ICD-10-CM | POA: Diagnosis not present

## 2022-04-01 DIAGNOSIS — I129 Hypertensive chronic kidney disease with stage 1 through stage 4 chronic kidney disease, or unspecified chronic kidney disease: Secondary | ICD-10-CM | POA: Diagnosis not present

## 2022-04-01 DIAGNOSIS — J9621 Acute and chronic respiratory failure with hypoxia: Secondary | ICD-10-CM | POA: Diagnosis present

## 2022-04-01 DIAGNOSIS — E1169 Type 2 diabetes mellitus with other specified complication: Secondary | ICD-10-CM

## 2022-04-01 DIAGNOSIS — D631 Anemia in chronic kidney disease: Secondary | ICD-10-CM | POA: Diagnosis present

## 2022-04-01 DIAGNOSIS — E785 Hyperlipidemia, unspecified: Secondary | ICD-10-CM | POA: Diagnosis present

## 2022-04-01 DIAGNOSIS — Z8249 Family history of ischemic heart disease and other diseases of the circulatory system: Secondary | ICD-10-CM

## 2022-04-01 DIAGNOSIS — I252 Old myocardial infarction: Secondary | ICD-10-CM

## 2022-04-01 DIAGNOSIS — Z833 Family history of diabetes mellitus: Secondary | ICD-10-CM | POA: Diagnosis not present

## 2022-04-01 DIAGNOSIS — Z720 Tobacco use: Secondary | ICD-10-CM | POA: Diagnosis not present

## 2022-04-01 DIAGNOSIS — I10 Essential (primary) hypertension: Secondary | ICD-10-CM | POA: Diagnosis not present

## 2022-04-01 DIAGNOSIS — Z20822 Contact with and (suspected) exposure to covid-19: Secondary | ICD-10-CM | POA: Diagnosis present

## 2022-04-01 DIAGNOSIS — Z7902 Long term (current) use of antithrombotics/antiplatelets: Secondary | ICD-10-CM

## 2022-04-01 DIAGNOSIS — Z79899 Other long term (current) drug therapy: Secondary | ICD-10-CM

## 2022-04-01 DIAGNOSIS — I483 Typical atrial flutter: Secondary | ICD-10-CM

## 2022-04-01 DIAGNOSIS — R0602 Shortness of breath: Secondary | ICD-10-CM | POA: Diagnosis not present

## 2022-04-01 DIAGNOSIS — E875 Hyperkalemia: Secondary | ICD-10-CM | POA: Diagnosis present

## 2022-04-01 DIAGNOSIS — Z7984 Long term (current) use of oral hypoglycemic drugs: Secondary | ICD-10-CM

## 2022-04-01 DIAGNOSIS — R7989 Other specified abnormal findings of blood chemistry: Secondary | ICD-10-CM | POA: Diagnosis present

## 2022-04-01 DIAGNOSIS — Z96653 Presence of artificial knee joint, bilateral: Secondary | ICD-10-CM | POA: Diagnosis present

## 2022-04-01 DIAGNOSIS — I272 Pulmonary hypertension, unspecified: Secondary | ICD-10-CM | POA: Diagnosis present

## 2022-04-01 DIAGNOSIS — I2510 Atherosclerotic heart disease of native coronary artery without angina pectoris: Secondary | ICD-10-CM | POA: Diagnosis present

## 2022-04-01 DIAGNOSIS — I5033 Acute on chronic diastolic (congestive) heart failure: Secondary | ICD-10-CM | POA: Diagnosis not present

## 2022-04-01 DIAGNOSIS — E782 Mixed hyperlipidemia: Secondary | ICD-10-CM

## 2022-04-01 DIAGNOSIS — N184 Chronic kidney disease, stage 4 (severe): Secondary | ICD-10-CM | POA: Diagnosis present

## 2022-04-01 DIAGNOSIS — F419 Anxiety disorder, unspecified: Secondary | ICD-10-CM | POA: Diagnosis present

## 2022-04-01 DIAGNOSIS — N189 Chronic kidney disease, unspecified: Secondary | ICD-10-CM | POA: Diagnosis present

## 2022-04-01 DIAGNOSIS — E8770 Fluid overload, unspecified: Secondary | ICD-10-CM | POA: Diagnosis not present

## 2022-04-01 DIAGNOSIS — G4700 Insomnia, unspecified: Secondary | ICD-10-CM | POA: Diagnosis present

## 2022-04-01 DIAGNOSIS — E1159 Type 2 diabetes mellitus with other circulatory complications: Secondary | ICD-10-CM | POA: Diagnosis not present

## 2022-04-01 DIAGNOSIS — Z6837 Body mass index (BMI) 37.0-37.9, adult: Secondary | ICD-10-CM

## 2022-04-01 DIAGNOSIS — I5030 Unspecified diastolic (congestive) heart failure: Secondary | ICD-10-CM | POA: Diagnosis present

## 2022-04-01 DIAGNOSIS — Z800 Family history of malignant neoplasm of digestive organs: Secondary | ICD-10-CM

## 2022-04-01 DIAGNOSIS — E119 Type 2 diabetes mellitus without complications: Secondary | ICD-10-CM

## 2022-04-01 LAB — CBC
HCT: 39.9 % (ref 39.0–52.0)
Hemoglobin: 11.5 g/dL — ABNORMAL LOW (ref 13.0–17.0)
MCH: 22.4 pg — ABNORMAL LOW (ref 26.0–34.0)
MCHC: 28.8 g/dL — ABNORMAL LOW (ref 30.0–36.0)
MCV: 77.6 fL — ABNORMAL LOW (ref 80.0–100.0)
Platelets: 242 10*3/uL (ref 150–400)
RBC: 5.14 MIL/uL (ref 4.22–5.81)
RDW: 20 % — ABNORMAL HIGH (ref 11.5–15.5)
WBC: 7.1 10*3/uL (ref 4.0–10.5)
nRBC: 0 % (ref 0.0–0.2)

## 2022-04-01 LAB — ECHOCARDIOGRAM COMPLETE
AR max vel: 2.04 cm2
AV Area VTI: 2.11 cm2
AV Area mean vel: 1.99 cm2
AV Mean grad: 6 mmHg
AV Peak grad: 8.4 mmHg
Ao pk vel: 1.45 m/s
Area-P 1/2: 4.6 cm2
S' Lateral: 3.03 cm

## 2022-04-01 LAB — BLOOD GAS, VENOUS
Acid-Base Excess: 8.3 mmol/L — ABNORMAL HIGH (ref 0.0–2.0)
Bicarbonate: 38.1 mmol/L — ABNORMAL HIGH (ref 20.0–28.0)
O2 Saturation: 40.8 %
Patient temperature: 37
pCO2, Ven: 83 mmHg (ref 44–60)
pH, Ven: 7.27 (ref 7.25–7.43)
pO2, Ven: 31 mmHg — CL (ref 32–45)

## 2022-04-01 LAB — BASIC METABOLIC PANEL
Anion gap: 10 (ref 5–15)
BUN: 69 mg/dL — ABNORMAL HIGH (ref 8–23)
CO2: 31 mmol/L (ref 22–32)
Calcium: 9.6 mg/dL (ref 8.9–10.3)
Chloride: 97 mmol/L — ABNORMAL LOW (ref 98–111)
Creatinine, Ser: 3.94 mg/dL — ABNORMAL HIGH (ref 0.61–1.24)
GFR, Estimated: 15 mL/min — ABNORMAL LOW (ref >60)
Glucose, Bld: 165 mg/dL — ABNORMAL HIGH (ref 70–99)
Potassium: 5.2 mmol/L — ABNORMAL HIGH (ref 3.5–5.1)
Sodium: 138 mmol/L (ref 135–145)

## 2022-04-01 LAB — TROPONIN I (HIGH SENSITIVITY)
Troponin I (High Sensitivity): 22 ng/L — ABNORMAL HIGH (ref <18)
Troponin I (High Sensitivity): 22 ng/L — ABNORMAL HIGH (ref <18)

## 2022-04-01 LAB — GLUCOSE, CAPILLARY
Glucose-Capillary: 128 mg/dL — ABNORMAL HIGH (ref 70–99)
Glucose-Capillary: 152 mg/dL — ABNORMAL HIGH (ref 70–99)

## 2022-04-01 LAB — BRAIN NATRIURETIC PEPTIDE: B Natriuretic Peptide: 267.2 pg/mL — ABNORMAL HIGH (ref 0.0–100.0)

## 2022-04-01 LAB — PROCALCITONIN: Procalcitonin: <0.1 ng/mL

## 2022-04-01 LAB — TSH: TSH: 2.088 u[IU]/mL (ref 0.350–4.500)

## 2022-04-01 LAB — RESP PANEL BY RT-PCR (FLU A&B, COVID) ARPGX2
Influenza A by PCR: NEGATIVE
Influenza B by PCR: NEGATIVE
SARS Coronavirus 2 by RT PCR: NEGATIVE

## 2022-04-01 MED ORDER — HEPARIN SODIUM (PORCINE) 5000 UNIT/ML IJ SOLN
5000.0000 [IU] | Freq: Three times a day (TID) | INTRAMUSCULAR | Status: DC
  Administered 2022-04-01 – 2022-04-03 (×6): 5000 [IU] via SUBCUTANEOUS
  Filled 2022-04-01 (×6): qty 1

## 2022-04-01 MED ORDER — SODIUM CHLORIDE 0.9% FLUSH
3.0000 mL | Freq: Two times a day (BID) | INTRAVENOUS | Status: DC
  Administered 2022-04-01 – 2022-04-04 (×4): 3 mL via INTRAVENOUS

## 2022-04-01 MED ORDER — FUROSEMIDE 10 MG/ML IJ SOLN
80.0000 mg | Freq: Once | INTRAMUSCULAR | Status: AC
  Administered 2022-04-01: 80 mg via INTRAVENOUS
  Filled 2022-04-01: qty 8

## 2022-04-01 MED ORDER — ACETAMINOPHEN 650 MG RE SUPP: 650.0000 mg | Freq: Four times a day (QID) | RECTAL | Status: DC | PRN

## 2022-04-01 MED ORDER — FENOFIBRATE 160 MG PO TABS
160.0000 mg | ORAL_TABLET | Freq: Every day | ORAL | Status: DC
Start: 2022-04-02 — End: 2022-04-01

## 2022-04-01 MED ORDER — ALBUTEROL SULFATE (2.5 MG/3ML) 0.083% IN NEBU
3.0000 mL | INHALATION_SOLUTION | RESPIRATORY_TRACT | Status: DC | PRN
Start: 2022-04-01 — End: 2022-04-01

## 2022-04-01 MED ORDER — ATORVASTATIN CALCIUM 80 MG PO TABS
80.0000 mg | ORAL_TABLET | Freq: Every evening | ORAL | Status: DC
  Administered 2022-04-01 – 2022-04-03 (×3): 80 mg via ORAL
  Filled 2022-04-01 (×3): qty 1

## 2022-04-01 MED ORDER — PANTOPRAZOLE SODIUM 40 MG PO TBEC
40.0000 mg | DELAYED_RELEASE_TABLET | Freq: Every day | ORAL | Status: DC
  Administered 2022-04-02 – 2022-04-04 (×3): 40 mg via ORAL
  Filled 2022-04-01 (×3): qty 1

## 2022-04-01 MED ORDER — ACETAMINOPHEN 325 MG PO TABS: 650.0000 mg | ORAL_TABLET | Freq: Four times a day (QID) | ORAL | Status: DC | PRN

## 2022-04-01 MED ORDER — LOSARTAN POTASSIUM 50 MG PO TABS: 50.0000 mg | ORAL_TABLET | Freq: Every day | ORAL | Status: DC

## 2022-04-01 MED ORDER — ONDANSETRON HCL 4 MG PO TABS: 4.0000 mg | ORAL_TABLET | Freq: Four times a day (QID) | ORAL | Status: DC | PRN

## 2022-04-01 MED ORDER — CLOPIDOGREL BISULFATE 75 MG PO TABS
75.0000 mg | ORAL_TABLET | Freq: Every day | ORAL | Status: DC
  Administered 2022-04-02: 75 mg via ORAL
  Filled 2022-04-01 (×2): qty 1

## 2022-04-01 MED ORDER — FUROSEMIDE 10 MG/ML IJ SOLN
6.0000 mg/h | INTRAVENOUS | Status: DC
  Administered 2022-04-01 – 2022-04-03 (×2): 6 mg/h via INTRAVENOUS
  Filled 2022-04-01 (×3): qty 20

## 2022-04-01 MED ORDER — METOPROLOL TARTRATE 25 MG PO TABS
25.0000 mg | ORAL_TABLET | Freq: Two times a day (BID) | ORAL | Status: DC
  Administered 2022-04-01 – 2022-04-04 (×6): 25 mg via ORAL
  Filled 2022-04-01 (×6): qty 1

## 2022-04-01 MED ORDER — TRAZODONE HCL 50 MG PO TABS
50.0000 mg | ORAL_TABLET | Freq: Every day | ORAL | Status: DC
  Administered 2022-04-01 – 2022-04-03 (×3): 50 mg via ORAL
  Filled 2022-04-01 (×3): qty 1

## 2022-04-01 MED ORDER — FUROSEMIDE 10 MG/ML IJ SOLN: 80.0000 mg | Freq: Two times a day (BID) | INTRAMUSCULAR | Status: DC

## 2022-04-01 MED ORDER — ALPRAZOLAM 0.5 MG PO TABS
0.5000 mg | ORAL_TABLET | Freq: Every evening | ORAL | Status: DC | PRN
Start: 2022-04-01 — End: 2022-04-04
  Administered 2022-04-01 – 2022-04-03 (×3): 0.5 mg via ORAL
  Filled 2022-04-01 (×3): qty 1

## 2022-04-01 MED ORDER — NICOTINE 21 MG/24HR TD PT24
21.0000 mg | MEDICATED_PATCH | Freq: Every day | TRANSDERMAL | Status: DC
  Administered 2022-04-02 – 2022-04-04 (×3): 21 mg via TRANSDERMAL
  Filled 2022-04-01 (×3): qty 1

## 2022-04-01 MED ORDER — ONDANSETRON HCL 4 MG/2ML IJ SOLN: 4.0000 mg | Freq: Four times a day (QID) | INTRAMUSCULAR | Status: DC | PRN

## 2022-04-01 MED ORDER — INSULIN ASPART 100 UNIT/ML IJ SOLN
0.0000 [IU] | Freq: Three times a day (TID) | INTRAMUSCULAR | Status: DC
  Administered 2022-04-02: 3 [IU] via SUBCUTANEOUS
  Administered 2022-04-02 – 2022-04-03 (×3): 2 [IU] via SUBCUTANEOUS
  Administered 2022-04-04: 3 [IU] via SUBCUTANEOUS
  Filled 2022-04-01 (×6): qty 1

## 2022-04-01 MED ORDER — ALBUTEROL SULFATE (2.5 MG/3ML) 0.083% IN NEBU
3.0000 mL | INHALATION_SOLUTION | RESPIRATORY_TRACT | Status: DC | PRN
Start: 2022-04-01 — End: 2022-04-04

## 2022-04-01 NOTE — Progress Notes (Signed)
[facility]  ROUNDING NOTE   Subjective:   Joshua Roy is a 71 year old male with past medical conditions including hyperlipidemia, hypertension, COPD, diabetes, and chronic kidney disease.  Patient presents to the emergency department with shortness of breath and has been admitted for COPD exacerbation ([place]) [J44.1] Acute respiratory failure with hypoxia ([place]ua Roy) [J96.01] Congestive heart failure, unspecified HF chronicity, unspecified heart failure type [facility]) [I50.9]  Patient is known to our practice and is followed by Dr. Zollie Scale outpatient.  He states he has been having progressive shortness of breath over the past few weeks.  He has contacted Dr. Holley Raring and was instructed to increase furosemide to twice daily.  Patient states he was also receiving IV furosemide, unsure of dosing, 3 days weekly.  Patient was instructed to come to hospital for evaluation however declined at that time.  Patient states he was scheduled for an in office visit with Dr. Holley Raring today however felt bed when he awoke and decided to come to the hospital.  Patient states he was having good urine output with Lasix dosing.  Endorses cough, nonproductive.  Reports he uses oxygen at night only however has required it during the day for the past week or so.  Labs on ED arrival include potassium 5.2, glucose 165, BUN 69, creatinine 3.94, with GFR 15.  Troponin 22 and BNP 267.  Respiratory panel negative for influenza and COVID-19.  Chest x-ray shows no acute changes.  Echo pending.  We have been consulted to help manage fluid volume.  Objective:  Vital signs in last 24 hours:  Temp:  [97.4 F (36.3 C)-98.3 F (36.8 C)] 97.4 F (36.3 C) (09/05 1638) Pulse Rate:  [76-92] 79 (09/05 1638) Resp:  [18-23] 18 (09/05 1638) BP: (103-132)/(58-77) 130/68 (09/05 1638) SpO2:  [78 %-94 %] 92 % (09/05 1638)  Weight change:  There were no vitals filed for this visit.  Intake/Output: No intake/output data  recorded.   Intake/Output this shift:  Total I/O In: -  Out: 300 [Urine:300]  Physical Exam: General: NAD  Head: Normocephalic, atraumatic. Moist oral mucosal membranes  Eyes: Anicteric  Neck: Supple  Lungs:  Coarse throughout with wheezing, normal effort, [place] O2  Heart: Regular rate and rhythm  Abdomen:  Soft, nontender,   Extremities: 3+ peripheral edema.  Neurologic: Nonfocal, moving all four extremities  Skin: No lesions  Access: None    Basic Metabolic Panel: Recent Labs  Lab 03/28/22 1349 04/01/22 1052  NA 136 138  K 6.0* 5.2*  CL 97* 97*  CO2 29 31  GLUCOSE 67* 165*  BUN 82* 69*  CREATININE 5.45* 3.94*  CALCIUM 8.8* 9.6  PHOS 5.6*  --     Liver Function Tests: Recent Labs  Lab 03/28/22 1349  ALBUMIN 3.8   No results for input(s): "LIPASE", "AMYLASE" in the last 168 hours. No results for input(s): "AMMONIA" in the last 168 hours.  CBC: Recent Labs  Lab 04/01/22 1052  WBC 7.1  HGB 11.5*  HCT 39.9  MCV 77.6*  PLT 242    Cardiac Enzymes: No results for input(s): "CKTOTAL", "CKMB", "CKMBINDEX", "TROPONINI" in the last 168 hours.  BNP: Invalid input(s): "POCBNP"  CBG: No results for input(s): "GLUCAP" in the last 168 hours.  Microbiology: Results for orders placed or performed during the hospital encounter of 04/01/22  Resp Panel by RT-PCR (Flu A&B, Covid) Anterior Nasal Swab     Status: None   Collection Time: 04/01/22 11:25 AM   Specimen: Anterior Nasal Swab  Result Value Ref Range Status   SARS Coronavirus 2 by RT PCR NEGATIVE NEGATIVE Final    Comment: (NOTE) SARS-CoV-2 target nucleic acids are NOT DETECTED.  The SARS-CoV-2 RNA is generally detectable in upper respiratory specimens during the acute phase of infection. The lowest concentration of SARS-CoV-2 viral copies this assay can detect is 138 copies/mL. A negative result does not preclude SARS-Cov-2 infection and should not be used as the sole basis for treatment or other  patient management decisions. A negative result may occur with  improper specimen collection/handling, submission of specimen other than nasopharyngeal swab, presence of viral mutation(s) within the areas targeted by this assay, and inadequate number of viral copies(<138 copies/mL). A negative result must be combined with clinical observations, patient history, and epidemiological information. The expected result is Negative.  Fact Sheet for Patients:  EntrepreneurPulse.com.au  Fact Sheet for Healthcare Providers:  IncredibleEmployment.be  This test is no t yet approved or cleared by the [place] FDA and  has been authorized for detection and/or diagnosis of SARS-CoV-2 by FDA under an Emergency Use Authorization (EUA). This EUA will remain  in effect (meaning this test can be used) for the duration of the COVID-19 declaration under Section 564(b)(1) of the Act, 21 U.S.C.section 360bbb-3(b)(1), unless the authorization is terminated  or revoked sooner.       Influenza A by PCR NEGATIVE NEGATIVE Final   Influenza B by PCR NEGATIVE NEGATIVE Final    Comment: (NOTE) The Xpert Xpress SARS-CoV-2/FLU/RSV plus assay is intended as an aid in the diagnosis of influenza from Nasopharyngeal swab specimens and should not be used as a sole basis for treatment. Nasal washings and aspirates are unacceptable for Xpert Xpress SARS-CoV-2/FLU/RSV testing.  Fact Sheet for Patients: EntrepreneurPulse.com.au  Fact Sheet for Healthcare Providers: IncredibleEmployment.be  This test is not yet approved or cleared by the [place] FDA and has been authorized for detection and/or diagnosis of SARS-CoV-2 by FDA under an Emergency Use Authorization (EUA). This EUA will remain in effect (meaning this test can be used) for the duration of the COVID-19 declaration under Section 564(b)(1) of the Act, 21 U.S.C. section  360bbb-3(b)(1), unless the authorization is terminated or revoked.  Performed at [facility], [address] [place], [place] [address]     Coagulation Studies: No results for input(s): "LABPROT", "INR" in the last 72 hours.  Urinalysis: No results for input(s): "COLORURINE", "LABSPEC", "PHURINE", "GLUCOSEU", "HGBUR", "BILIRUBINUR", "KETONESUR", "PROTEINUR", "UROBILINOGEN", "NITRITE", "LEUKOCYTESUR" in the last 72 hours.  Invalid input(s): "APPERANCEUR"    Imaging: DG Chest 2 View  Result Date: 04/01/2022 CLINICAL DATA:  71 year old male with shortness of breath, fluid overload. EXAM: CHEST - 2 VIEW COMPARISON:  August 09, 2021 FINDINGS: Trachea midline. Cardiomediastinal contours and hilar structures are stable. Central pulmonary vasculature suggest mild engorgement. No signs of frank edema.  No lobar consolidation. No pneumothorax. On limited assessment no acute skeletal findings. 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 IMPRESSION: No active cardiopulmonary disease. Electronically Signed   By: Zetta Bills M.D.   On: 04/01/2022 11:24     Medications:    furosemide (LASIX) 200 mg in dextrose 5 % 100 mL (2 mg/mL) infusion      atorvastatin  80 mg Oral QPM   [START ON 04/02/2022] clopidogrel  75 mg Oral Daily   heparin  5,000 Units Subcutaneous Q8H   insulin aspart  0-6 Units Subcutaneous TID WC   metoprolol tartrate  25 mg Oral  BID   [START ON 04/02/2022] nicotine  21 mg Transdermal Daily   [START ON 04/02/2022] pantoprazole  40 mg Oral QAC breakfast   sodium chloride flush  3 mL Intravenous Q12H   traZODone  50 mg Oral QHS   acetaminophen **OR** acetaminophen, albuterol, ALPRAZolam, ondansetron **OR** ondansetron (ZOFRAN) IV  Assessment/ Plan:  Joshua Roy is a 71 y.o.  male with past medical conditions including hyperlipidemia, hypertension, COPD, diabetes, and chronic kidney disease.  Patient presents  to the emergency department with shortness of breath and has been admitted for COPD exacerbation ([place]) [J44.1] Acute respiratory failure with hypoxia ([place]) [J96.01] Congestive heart failure, unspecified HF chronicity, unspecified heart failure type ([place]) [I50.9]    Acute Kidney Injury on chronic kidney disease stage IV with baseline creatinine 2.09 and GFR of 33 on 12/24/21.  Acute kidney injury secondary to aggressive outpatient diuresis and increase fluid volume.  No IV contrast exposure.  Due to patient presentation, will continue diuresis with furosemide drip 6 mg/h.  We will order strict I's and O's and daily weights to monitor fluid removal.  Echo pending.  Lab Results  Component Value Date   CREATININE 3.94 (H) 04/01/2022   CREATININE 5.45 (H) 03/28/2022   CREATININE 2.60 (H) 03/18/2022    Intake/Output Summary (Last 24 hours) at 04/01/2022 1642 Last data filed at 04/01/2022 1200 Gross per 24 hour  Intake --  Output 300 ml  Net -300 ml   2.  Hypertension with chronic kidney disease.  Home regimen includes furosemide, losartan and metoprolol.  Currently receiving metoprolol with furosemide drip.  3. Anemia of chronic kidney disease Lab Results  Component Value Date   HGB 11.5 (L) 04/01/2022    Hemoglobin at goal.     LOS: 0 Aryelle Figg 9/5/20234:42 PM

## 2022-04-01 NOTE — ED Provider Notes (Signed)
[facility] Provider Note    Event Date/Time   First MD Initiated Contact with Patient 04/01/22 1107     (approximate)   History   Chief Complaint: Shortness of Breath   HPI  Joshua Roy is a 71 y.o. male with a history of COPD hypertension diabetes heart failure CKD who comes the ED complaining of worsening shortness of breath for the past week, gradual onset.  Associated with bilateral leg swelling.  He notes 20 pound weight gain over the last 2 weeks.  He has been coming to cancer center for Lasix infusions because he was not noticing any relief after doubling his oral furosemide at home, but symptoms continue to worsen.  He is having orthopnea and dyspnea on exertion.  No significant chest pain.     Physical Exam   Triage Vital Signs: ED Triage Vitals  Enc Vitals Group     BP 04/01/22 1052 131/74     Pulse Rate 04/01/22 1052 80     Resp 04/01/22 1052 (!) 21     Temp 04/01/22 1052 98 F (36.7 C)     Temp src --      SpO2 04/01/22 1052 (!) 78 %     Weight --      Height --      Head Circumference --      Peak Flow --      Pain Score 04/01/22 1049 0     Pain Loc --      Pain Edu? --      Excl. in [place]? --     Most recent vital signs: Vitals:   04/01/22 1125 04/01/22 1130  BP: (!) 117/58 103/77  Pulse: 82 92  Resp: 20 (!) 23  Temp:    SpO2: 93% 92%    General: Awake, no distress.  CV:  Good peripheral perfusion.  Regular rate and rhythm. Resp:  Normal effort.  Bilateral basilar crackles. Abd:  No distention.  Soft nontender Other:  2+ pitting edema bilateral lower extremities.  Symmetric calf circumference.  No calf tenderness.   ED Results / Procedures / Treatments   Labs (all labs ordered are listed, but only abnormal results are displayed) Labs Reviewed  CBC - Abnormal; Notable for the following components:      Result Value   Hemoglobin 11.5 (*)    MCV 77.6 (*)    MCH 22.4 (*)    MCHC 28.8 (*)    RDW 20.0 (*)     All other components within normal limits  BASIC METABOLIC PANEL - Abnormal; Notable for the following components:   Potassium 5.2 (*)    Chloride 97 (*)    Glucose, Bld 165 (*)    BUN 69 (*)    Creatinine, Ser 3.94 (*)    GFR, Estimated 15 (*)    All other components within normal limits  BRAIN NATRIURETIC PEPTIDE - Abnormal; Notable for the following components:   B Natriuretic Peptide 267.2 (*)    All other components within normal limits  TROPONIN I (HIGH SENSITIVITY) - Abnormal; Notable for the following components:   Troponin I (High Sensitivity) 22 (*)    All other components within normal limits  RESP PANEL BY RT-PCR (FLU A&B, COVID) ARPGX2  PROCALCITONIN  BLOOD GAS, VENOUS     EKG Interpreted by me Atrial flutter, rate of 97.  Right axis, poor R wave progression.  Normal ST segments.  No acute ischemic changes.  4 PVCs on  the strip.   RADIOLOGY Chest x-ray interpreted by me, appears normal.  Radiology report reviewed.  No effusion or edema or consolidation.   PROCEDURES:  .Critical Care  Performed by: Carrie Mew, MD Authorized by: Carrie Mew, MD   Critical care provider statement:    Critical care time (minutes):  35   Critical care time was exclusive of:  Separately billable procedures and treating other patients   Critical care was necessary to treat or prevent imminent or life-threatening deterioration of the following conditions:  Respiratory failure   Critical care was time spent personally by me on the following activities:  Development of treatment plan with patient or surrogate, discussions with consultants, evaluation of patient's response to treatment, examination of patient, obtaining history from patient or surrogate, ordering and performing treatments and interventions, ordering and review of laboratory studies, ordering and review of radiographic studies, pulse oximetry, re-evaluation of patient's condition and review of old charts    Care discussed with: admitting provider      MEDICATIONS ORDERED IN ED: Medications  heparin injection 5,000 Units (has no administration in time range)  sodium chloride flush (NS) 0.9 % injection 3 mL (has no administration in time range)  acetaminophen (TYLENOL) tablet 650 mg (has no administration in time range)    Or  acetaminophen (TYLENOL) suppository 650 mg (has no administration in time range)  albuterol (PROVENTIL) (2.5 MG/3ML) 0.083% nebulizer solution 3 mL (has no administration in time range)  ondansetron (ZOFRAN) tablet 4 mg (has no administration in time range)    Or  ondansetron (ZOFRAN) injection 4 mg (has no administration in time range)  furosemide (LASIX) injection 80 mg (80 mg Intravenous Given 04/01/22 1122)     IMPRESSION / MDM / [place] / ED COURSE  I reviewed the triage vital signs and the nursing notes.                              Differential diagnosis includes, but is not limited to, pneumonia, pleural effusion, pulmonary edema, CHF exacerbation, non-STEMI, renal failure, electrolyte abnormality, anemia  Patient's presentation is most consistent with acute presentation with potential threat to life or bodily function.  Patient presents with shortness of breath and hypoxia to 78% on room air.  Normalized on 3 L nasal cannula.  Clinically he appears volume overloaded.  We will give IV Lasix.  Labs show stable CKD, overall unremarkable.  Case discussed with hospitalist for further management.       FINAL CLINICAL IMPRESSION(S) / ED DIAGNOSES   Final diagnoses:  Acute respiratory failure with hypoxia (HCC)  COPD exacerbation (HCC)  Congestive heart failure, unspecified HF chronicity, unspecified heart failure type ([place])     Rx / DC Orders   ED Discharge Orders     None        Note:  This document was prepared using Dragon voice recognition software and may include unintentional dictation errors.   Carrie Mew, MD 04/01/22  1235

## 2022-04-01 NOTE — ED Triage Notes (Signed)
Pt comes with c/o fluid overload. Pt states SOb for over week now. Pt states swelling in legs and feet. Pt states he has bee coming in to cancer center for iron injections and lasix

## 2022-04-01 NOTE — ED Notes (Addendum)
Pt placed on 2L [place] with increase to 88%. Pt does have hx of copd and is a smoker.

## 2022-04-01 NOTE — H&P (Signed)
History and Physical    Patient: Joshua Roy VZD:[id number] DOB: 07/15/1951 DOA: 04/01/2022 DOS: the patient was seen and examined on 04/01/2022 PCP: [name], MD  Patient coming from: Home lives with wife.  Chief Complaint:  Chief Complaint  Patient presents with   Shortness of Breath   HPI: Joshua Roy is a 71 y.o. male with medical history significant of HTN, HLD, HFpEF, PAF, PSVT CAD, COPD, CKD stage III/IV, diabetes mellitus type 2, anxiety, and tobacco abuse presents with complaints of progressively worsening shortness of breath.  Patient admits that his shortness of breath worsens with any kind of activity.  Over the last 2 weeks he has gained approximately 20 pounds.  Reports associated symptoms of bilateral lower extremity swelling and orthopnea.  For at least the last week he had increased his Lasix from 40 mg twice daily to 80 mg twice daily without significant change in his symptoms.  He also had notified his nephrologist Dr. Holley Raring in regards to his weight gain and had been set up to received outpatient IV Lasix infusions.  His last infusion was 4 days ago, but over the holiday weekend and his symptoms worsen and he was advised to come to the hospital for further evaluation.  During this time he has not had any significant fever, cough, nausea, vomiting, diarrhea, or abdominal pain symptoms.  Patient reports that he still smokes about a pack cigarettes per day on average. Need of dialysis had not been brought up.  Upon admission to the emergency department patient was noted to be afebrile, tachypneic, and O2 saturations as low as 78% with improvement to greater than 92% on 3 L of nasal cannula oxygen.  Labs were significant for potassium 5.2, BUN 69, creatinine 3.94, BNP 267.2, and high-sensitivity troponin 22.  Chest x-ray showed stable appearance without active disease.  Patient was given Lasix 80 mg IV.  Review of Systems: As mentioned in the history of present  illness. All other systems reviewed and are negative. Past Medical History:  Diagnosis Date   (HFpEF) heart failure with preserved ejection fraction ([place])    a. 07/2019 Echo: EF 60-65%. Mod LVH. Gr1 DD. No rwma. Nl RV size/fxn. Mildly dil LA; b. 11/2019 Echo: EF 55-60%, no rwma, mild LVH. Nl RV size/fxn. Mod dil LA.   ACE-inhibitor cough    Anxiety    Bronchitis 06/27/2016   Carotid arterial disease ([place])    a. 09/2019 Carotid U/S: RICA 29-51%, LICA 8-84%; b. 07/6604 R CEA.   CKD (chronic kidney disease), stage III (HCC)    COPD (chronic obstructive pulmonary disease) (HCC)    cath, mild inf. hypokinesis EF 49%, 100% ROA   Coronary artery disease    a. 2001 Cath: LM 10, LAD 60p/m, D1 30, LCX 20p, RCA 157md-->Med Rx; b. 05/2017 Ex Mv: Ex time 5:46, antlat twi @ rest, fixed infarct w/ mild peri-infarct ischemia. EF 38%; c. 08/2019 Cath: LM 40ost, 70d, LAD 784mLCX 8031mCA 80-90p, 100m24m 09/2019 CABG x4: LIMA->LAD, VG->RI, VG->OM, VG->RPDA.   Diabetes mellitus type II    Hyperlipidemia    Hypertension    [facility] (osteoarthritis)    knee OA, injected 2012 by ortho   PAF (paroxysmal atrial fibrillation) (HCC)[place] a. 09/2019 post-op CABG-->[place].   Pneumonia    PSVT (paroxysmal supraventricular tachycardia) (HCC)[place] a. 08/2019 [place]io: 189 brief runs of SVT (fastest 174 x 6 beats, longest 15 beats @  107).   Tobacco abuse    Past Surgical History:  Procedure Laterality Date   CARDIAC CATHETERIZATION  05/06/2000   @ [place]   COLONOSCOPY WITH PROPOFOL N/A 04/20/2018   Procedure: COLONOSCOPY WITH PROPOFOL;  Surgeon: [name], MD;  Location[facility] ENDOSCOPY;  Service: Endoscopy;  Laterality: N/A;   CORONARY ARTERY BYPASS GRAFT N/A 10/07/2019   Procedure: CORONARY ARTERY BYPASS GRAFTING (CABG) using LIMA to LAD; Endoscopic harvesting of left greater saphenous vein: SVG to RAMUS; SVG to OM1; SVG to PDA.;  Surgeon: Gaye Pollack, MD;  Location: [facility] OR;  Service: Open  Heart Surgery;  Laterality: N/A;   ENDARTERECTOMY Right 10/07/2019   Procedure: ENDARTERECTOMY CAROTID;  Surgeon: Rosetta Posner, MD;  [facility] OR;  Service: Vascular;  Laterality: Right;   ENDOVEIN HARVEST OF GREATER SAPHENOUS VEIN Left 10/07/2019   Procedure: [place] Greater Saphenous Vein;  Surgeon: Gaye Pollack, MD;  Location: [facility] OR;  Service: Open Heart Surgery;  Laterality: Left;   ESOPHAGOGASTRODUODENOSCOPY ENDOSCOPY     KNEE ARTHROSCOPY  07/25/04   left   [place]   right, open surgery- repair   RIGHT/LEFT HEART CATH AND CORONARY ANGIOGRAPHY Bilateral 09/08/2019   Procedure: RIGHT/LEFT HEART CATH AND CORONARY ANGIOGRAPHY;  Surgeon: Minna Merritts, MD;  Location: [place] CV LAB;  Service: Cardiovascular;  Laterality: Bilateral;   TEE WITHOUT CARDIOVERSION N/A 10/07/2019   Procedure: TRANSESOPHAGEAL ECHOCARDIOGRAM (TEE);  Surgeon: Gaye Pollack, MD;  Location: [place];  Service: Open Heart Surgery;  Laterality: N/A;   TOTAL KNEE ARTHROPLASTY Left 07/14/2016   Procedure: LEFT TOTAL KNEE ARTHROPLASTY;  Surgeon: Gaynelle Arabian, MD;  Location: [facility] ORS;  Service: Orthopedics;  Laterality: Left;   TOTAL KNEE ARTHROPLASTY Right 07/13/2017   Procedure: RIGHT TOTAL KNEE ARTHROPLASTY;  Surgeon: Gaynelle Arabian, MD;  Location: [facility] ORS;  Service: Orthopedics;  Laterality: Right;   Social History:  reports that he has been smoking cigarettes. He has a 52.00 pack-year smoking history. He has never used smokeless tobacco. He reports current alcohol use. He reports that he does not use drugs.  Allergies  Allergen Reactions   Ramipril Cough and Other (See Comments)    REACTION: Cough   Pseudoephedrine-Guaifenesin Er Other (See Comments) and Hypertension    Elevated BP, insomnia Elevated BP, insomnia    Family History  Problem Relation Age of Onset   Heart disease Father        CAD, MI x 3   Hypertension Father    Alcohol abuse Father    Diabetes Father     Diabetes Sister    Cancer Brother        lymphoma, in remission   Colon cancer Brother    Heart disease Sister        CABG x 3   Prostate cancer Neg Hx     Prior to Admission medications   Medication Sig Start Date End Date Taking? Authorizing Provider  ALPRAZolam Duanne Moron) 0.5 MG tablet Take 1 tablet (0.5 mg total) by mouth at bedtime as needed for anxiety. 01/02/22   Theresia Lo, NP  atorvastatin (LIPITOR) 80 MG tablet TAKE 1 TABLET BY MOUTH EVERY DAY 03/19/22   Minna Merritts, MD  clopidogrel (PLAVIX) 75 MG tablet Take 1 tablet (75 mg total) by mouth daily. 03/25/21   Minna Merritts, MD  fenofibrate 160 MG tablet Take 1 tablet (160 mg total) by mouth daily. 10/01/21   [name], MD  furosemide (LASIX) 40 MG  tablet Take 2 tablets (80 mg total) by mouth 2 (two) times daily. Patient taking differently: Take 40 mg by mouth daily. 08/13/21   Sharen Hones, MD  glimepiride (AMARYL) 2 MG tablet TAKE 1/2 TABLET BY MOUTH IN THE MORNING Patient taking differently: Take 1 mg by mouth daily with breakfast. 03/11/21   [name], MD  glucose blood (TRUE METRIX BLOOD GLUCOSE TEST) test strip 1 each by Other route daily. Use as instructed 01/24/20   [name], MD  iron polysaccharides (NIFEREX) 150 MG capsule Take 1 capsule (150 mg total) by mouth daily. 03/18/22   [name], MD  losartan (COZAAR) 50 MG tablet Take 50 mg by mouth daily.    [provider]  metFORMIN (GLUCOPHAGE) 1000 MG tablet TAKE 1 TABLET BY MOUTH TWICE A DAY 12/31/21   [name], MD  metoprolol tartrate (LOPRESSOR) 25 MG tablet Take 1 tablet (25 mg total) by mouth 2 (two) times daily. 10/01/21   [name], MD  nitroGLYCERIN (NITROSTAT) 0.4 MG SL tablet Place 0.4 mg under the tongue every 5 (five) minutes as needed for chest pain.    [provider]  pantoprazole (PROTONIX) 40 MG tablet TAKE 1 TABLET BY MOUTH DAILY BEFORE BREAKFAST 08/19/21   [name], MD  sildenafil (REVATIO) 20 MG tablet TAKE  ONE TABLET BY MOUTH DAILY AS NEEDED 12/28/20   Beckie Salts, FNP  traZODone (DESYREL) 50 MG tablet TAKE 1 TABLET BY MOUTH EVERY DAY 09/01/21   [name], MD    Physical Exam: Vitals:   04/01/22 1052 04/01/22 1125 04/01/22 1130  BP: 131/74 (!) 117/58 103/77  Pulse: 80 82 92  Resp: (!) 21 20 (!) 23  Temp: 98 F (36.7 C)    SpO2: (!) 78% 93% 92%    Constitutional: Elderly male currently in no acute distress Eyes: PERRL, lids and conjunctivae normal ENMT: Mucous membranes are moist.   Neck: normal, supple  Respiratory: Normal respiratory effort with some mild crackles appreciated in the lower lung fields.  No significant wheezes, rhonchi, or rales appreciated.  O2 saturation currently maintained on 4 L nasal cannula oxygen. Cardiovascular: Regular rate with 2+ pitting bilateral lower extremity edema 2+ pedal pulses.  No carotid bruits.  Abdomen: Protuberant abdomen with no tenderness to palpation and bowel sounds present. Musculoskeletal: no clubbing / cyanosis. No joint deformity upper and lower extremities. Good ROM, no contractures. Normal muscle tone.  Skin: no rashes, lesions, ulcers.   Neurologic: CN 2-12 grossly intact.  Strength 5/5 in all 4.  Psychiatric: Normal judgment and insight. Alert and oriented x 3. Normal mood.   Data Reviewed:  EKG revealed an indeterminate rhythm at 97 bpm.  Otherwise reviewed labs, imaging, and pertinent records as noted above in HPI.  Assessment and Plan:  Acute respiratory failure with hypoxia secondary to heart failure with preserved ejection fraction Acute.  Patient presents with progressively worsening shortness of breath with complaints of 20 pound weight gain, leg swelling, and orthopnea.  He had doubled Lasix to 80 mg twice daily and had been receiving IV Lasix infusions in the outpatient setting without improvement.  Chest x-ray showed no acute abnormality.  BNP was elevated 267.2 which is lower than prior.  Last EF noted to be 55 - 60%  with indeterminate diastolic parameters.  Patient has been given Lasix 80 mg IV x1 dose. -Admit to a cardiac telemetry bed -Heart failure order set utilized -Strict I&O's and daily weights -Follow-up influenza and COVID-19 screening -Follow-up venous blood gas  and procalcitonin -Check TSH -Check echocardiogram -Continue Lasix 80 mg IV twice daily -Continue beta-blocker  Hyperkalemia Acute.  Potassium elevated at 5.2.  Patient has been given Lasix IV in the ED. -Held losartan due to hyperkalemia  Elevated troponin Acute.  High-sensitivity troponin 22.  Would suspect possibly can be due to demand. -Continue to trend  Abnormal EKG Patient with a history of paroxysmal atrial fibrillation as well as paroxysmal SVT.  He is not on anticoagulation. -Correct electrolyte abnormalities.  Chronic kidney disease stage IV Creatinine 3.94 with BUN 69 on admission.  Creatinine had been elevated up to 5.45 on 9/1, and prior to that had been 2.6 on 8/22.  He is followed by Dr. Holley Raring of nephrology in outpatient setting.  Patient notes that dialysis had not been brought up. -Continue to monitor kidney function with diuresis -Nephrology consulted as it appears they have been managing fluid status in the outpatient setting.    Iron deficiency anemia Chronic.  Hemoglobin 11.5 g/dL with low MCV and MCH.  RDW also noted to be elevated at 20.  Most recent iron studies from 8/22 no iron 22, TIBC 613, and ferritin 10.  Patient is on iron supplementation. -Continue iron supplementation at discharge -Continue to monitor  CAD Patient with prior history of CABG in 2021 -Continue Plavix and statin  COPD, without acute exacerbation Patient without signs of wheezing on physical exam.  Diabetes mellitus type 2, without On admission glucose 165.  Patient has been on metformin 1000 mg twice daily and Amaryl 1 mg daily -Hypoglycemic protocols -Hold Amaryl  -Recommend discontinuation of metformin due to GFR  less than 30 -CBGs before every meal with very sensitive SSI  Essential hypertension Blood pressures currently stable.  Home blood pressure regimen includes metoprolol 25 mg twice daily, losartan 50 mg daily,  Hyperlipidemia -Recommend discontinuation of fenofibrate due to kidney function with GFR less than 30 -Continue atorvastatin  Anxiety/insomnia -Continue trazodone and Ativan as needed  GERD -Continue Protonix  Tobacco abuse Patient continues to smoke 1 pack cigarettes per day on average. -Nicotine patch -Continue counseling on need of cessation of tobacco use  DVT prophylaxis: Heparin Advance Care Planning:   Code Status: Full Code    Consults: Nephrology  Family Communication: Wife  Severity of Illness: The appropriate patient status for this patient is INPATIENT. Inpatient status is judged to be reasonable and necessary in order to provide the required intensity of service to ensure the patient's safety. The patient's presenting symptoms, physical exam findings, and initial radiographic and laboratory data in the context of their chronic comorbidities is felt to place them at high risk for further clinical deterioration. Furthermore, it is not anticipated that the patient will be medically stable for discharge from the hospital within 2 midnights of admission.   * I certify that at the point of admission it is my clinical judgment that the patient will require inpatient hospital care spanning beyond 2 midnights from the point of admission due to high intensity of service, high risk for further deterioration and high frequency of surveillance required.*  Author: Norval Morton, MD 04/01/2022 12:17 PM  For on call review [email or web address].

## 2022-04-02 ENCOUNTER — Ambulatory Visit: Admission: RE | Admit: 2022-04-02 | Payer: Medicare Other | Source: Ambulatory Visit

## 2022-04-02 DIAGNOSIS — J9601 Acute respiratory failure with hypoxia: Secondary | ICD-10-CM | POA: Diagnosis not present

## 2022-04-02 LAB — CBC
HCT: 39.7 % (ref 39.0–52.0)
Hemoglobin: 11.4 g/dL — ABNORMAL LOW (ref 13.0–17.0)
MCH: 22.3 pg — ABNORMAL LOW (ref 26.0–34.0)
MCHC: 28.7 g/dL — ABNORMAL LOW (ref 30.0–36.0)
MCV: 77.5 fL — ABNORMAL LOW (ref 80.0–100.0)
Platelets: 234 10*3/uL (ref 150–400)
RBC: 5.12 MIL/uL (ref 4.22–5.81)
RDW: 20 % — ABNORMAL HIGH (ref 11.5–15.5)
WBC: 6.6 10*3/uL (ref 4.0–10.5)
nRBC: 0 % (ref 0.0–0.2)

## 2022-04-02 LAB — BASIC METABOLIC PANEL
Anion gap: 10 (ref 5–15)
BUN: 63 mg/dL — ABNORMAL HIGH (ref 8–23)
CO2: 31 mmol/L (ref 22–32)
Calcium: 9.4 mg/dL (ref 8.9–10.3)
Chloride: 99 mmol/L (ref 98–111)
Creatinine, Ser: 3.62 mg/dL — ABNORMAL HIGH (ref 0.61–1.24)
GFR, Estimated: 17 mL/min — ABNORMAL LOW (ref >60)
Glucose, Bld: 126 mg/dL — ABNORMAL HIGH (ref 70–99)
Potassium: 4.3 mmol/L (ref 3.5–5.1)
Sodium: 140 mmol/L (ref 135–145)

## 2022-04-02 LAB — GLUCOSE, CAPILLARY
Glucose-Capillary: 116 mg/dL — ABNORMAL HIGH (ref 70–99)
Glucose-Capillary: 291 mg/dL — ABNORMAL HIGH (ref 70–99)
Glucose-Capillary: 207 mg/dL — ABNORMAL HIGH (ref 70–99)
Glucose-Capillary: 170 mg/dL — ABNORMAL HIGH (ref 70–99)
Glucose-Capillary: 189 mg/dL — ABNORMAL HIGH (ref 70–99)

## 2022-04-02 MED FILL — Ferumoxytol Inj 510 MG/17ML (30 MG/ML) (Elemental Fe): INTRAVENOUS | Qty: 17 | Status: AC

## 2022-04-02 NOTE — TOC Initial Note (Signed)
Transition of Care [facility]) - Initial/Assessment Note    Patient Details  Name: Joshua Roy MRN: [id number] Date of Birth: 12-May-1951  Transition of Care [facility]) CM/SW Contact:    Alberteen Sam, LCSW Phone Number: 04/02/2022, 2:28 PM  Clinical Narrative:                  CSW spoke with patient regarding home health recommendations, he reports he is not interested in home health services at this time. Is from home with wife, reports being independent no need for walker or any dme. Patient reports he identifies no discharge needs at this time.   Please notify TOC should needs arise.   Expected Discharge Plan: [place] Barriers to Discharge: Continued Medical Work up   Patient Goals and CMS Choice Patient states their goals for this hospitalization and ongoing recovery are:: to go home CMS Medicare.gov Compare Post Acute Care list provided to:: Patient Choice offered to / list presented to : Patient  Expected Discharge Plan and Services Expected Discharge Plan: [place]       Living arrangements for the past 2 months: Single Family Home                                      Prior Living Arrangements/Services Living arrangements for the past 2 months: Single Family Home Lives with:: Spouse                   Activities of Daily Living Home Assistive Devices/Equipment: None ADL Screening (condition at time of admission) Patient's cognitive ability adequate to safely complete daily activities?: Yes Is the patient deaf or have difficulty hearing?: No Does the patient have difficulty seeing, even when wearing glasses/contacts?: No Does the patient have difficulty concentrating, remembering, or making decisions?: No Patient able to express need for assistance with ADLs?: Yes Does the patient have difficulty dressing or bathing?: No Independently performs ADLs?: Yes (appropriate for developmental age) Does the patient have  difficulty walking or climbing stairs?: No Weakness of Legs: Both Weakness of Arms/Hands: None  Permission Sought/Granted                  Emotional Assessment       Orientation: : Oriented to Self, Oriented to Place, Oriented to  Time, Oriented to Situation Alcohol / Substance Use: Not Applicable Psych Involvement: No (comment)  Admission diagnosis:  COPD exacerbation ([place]) [J44.1] Acute respiratory failure with hypoxia ([place]) [J96.01] Congestive heart failure, unspecified HF chronicity, unspecified heart failure type ([place]) [I50.9] Patient Active Problem List   Diagnosis Date Noted   Acute respiratory failure with hypoxia ([place]) 04/01/2022   Hyperkalemia 04/01/2022   (HFpEF) heart failure with preserved ejection fraction ([place]) 04/01/2022   Elevated troponin 04/01/2022   Abnormal EKG 04/01/2022   Iron deficiency anemia 03/24/2022   Acute left-sided low back pain without sciatica 12/22/2021   Ankle edema, bilateral 12/22/2021   Acute on chronic respiratory failure ([place]) 08/09/2021   CKD stage 3 due to type 2 diabetes mellitus ([place]) 08/09/2021   Pneumonia due to COVID-19 virus 08/09/2021   Microcytic anemia 08/09/2021   Chronic kidney disease 06/18/2020   Anxiety 06/18/2020   Obstructive sleep apnea syndrome 06/18/2020   Seasonal allergic rhinitis due to pollen 12/22/2019   Palliative care by specialist    Acute on chronic diastolic CHF (congestive heart failure) ([place])  12/07/2019   Acute on chronic respiratory failure with hypercapnia ([place]) 12/07/2019   S/P CABG x 4 10/07/2019   Unstable angina ([place]) 09/08/2019   Personal history of colonic polyps    Benign neoplasm of ascending colon    Benign neoplasm of descending colon    Polyp of sigmoid colon    OA (osteoarthritis) of knee 07/14/2016   Medicare annual wellness visit, initial 11/23/2014   Goals of care, counseling/discussion 11/23/2014   Erectile dysfunction associated with type 2 diabetes mellitus ([place])  10/03/2013   Seborrheic keratoses 10/03/2013   Morbid obesity ([place]) 10/03/2013   Cough 08/01/2013   Gastric ulcer 05/16/2013   CAD (coronary artery disease) 05/21/2011   SOB (shortness of breath) 05/12/2011   Knee pain 11/18/2010   Essential hypertension 01/07/2007   T2DM (type 2 diabetes mellitus) ([place]) 01/06/2007   Hyperlipidemia 01/06/2007   Irritability 01/06/2007   Tobacco abuse 01/06/2007   COPD (chronic obstructive pulmonary disease) ([place]) 01/06/2007   PCP:  [name], MD Pharmacy:   [id number] [place] [id number]: [phone]: [facility] [place] [place]Phone: [phone]: [phone]    Social Determinants of Health (SDOH) Interventions    Readmission Risk Interventions    12/08/2019   12:59 PM  Readmission Risk Prevention Plan  Transportation Screening Complete  PCP or Specialist Appt within 3-5 Days Complete  HRI or Home Care Consult Complete  Medication Review (RN Care Manager) Complete

## 2022-04-02 NOTE — Evaluation (Signed)
Physical Therapy Evaluation Patient Details Name: Joshua Roy MRN: [id number] DOB: [birth year] Today's Date: 04/02/2022  History of Present Illness  Joshua Roy is a 71 y.o. male with medical history significant of HTN, HLD, HFpEF, PAF, PSVT CAD, COPD, CKD stage III/IV, diabetes mellitus type 2, anxiety, and tobacco abuse presents with complaints of progressively worsening shortness of breath.  Patient admits that his shortness of breath worsens with any kind of activity.  Over the last 2 weeks he has gained approximately 20 pounds.  Reports associated symptoms of bilateral lower extremity swelling and orthopnea.  For at least the last week he had increased his Lasix from 40 mg twice daily to 80 mg twice daily without significant change in his symptoms.   Clinical Impression  Patient received in recliner. He is agreeable to PT assessment. He is able to stand with mod I. Ambulated 150 feet pushing IV pole. O2 decreased to 85% with ambulation on 4 liters.  Patient limited by weakness. He will continue to benefit from skilled PT while here to improve strength, activity tolerance and independence.        Recommendations for follow up therapy are one component of a multi-disciplinary discharge planning process, led by the attending physician.  Recommendations may be updated based on patient status, additional functional criteria and insurance authorization.  Follow Up Recommendations Home health PT      Assistance Recommended at Discharge Intermittent Supervision/Assistance  Patient can return home with the following  A little help with walking and/or transfers;A little help with bathing/dressing/bathroom;Help with stairs or ramp for entrance;Assistance with cooking/housework    Equipment Recommendations None recommended by PT  Recommendations for Other Services       Functional Status Assessment Patient has had a recent decline in their functional status and demonstrates the  ability to make significant improvements in function in a reasonable and predictable amount of time.     Precautions / Restrictions Precautions Precautions: Fall Restrictions Weight Bearing Restrictions: No      Mobility  Bed Mobility               General bed mobility comments: NT, patient in recliner    Transfers Overall transfer level: Modified independent Equipment used: None                    Ambulation/Gait Ambulation/Gait assistance: Supervision Gait Distance (Feet): 150 Feet Assistive device: IV Pole Gait Pattern/deviations: Step-through pattern, Decreased step length - right, Decreased step length - left Gait velocity: decr     General Gait Details: patient is generally safe pushing IV pole with 1 UE. No lob, O2 sats down to 85% after walking, improved to 90% with PLB and seated rest.  Stairs            Wheelchair Mobility    Modified Rankin (Stroke Patients Only)       Balance Overall balance assessment: Modified Independent                                           Pertinent Vitals/Pain Pain Assessment Pain Assessment: No/denies pain    Home Living Family/patient expects to be discharged to:: Private residence Living Arrangements: Spouse/significant other Available Help at Discharge: Family;Available 24 hours/day Type of Home: House Home Access: Stairs to enter Entrance Stairs-Rails: Right;Left;Can reach both Entrance Stairs-Number of Steps: 5   Home Layout: Able  to live on main level with bedroom/bathroom Home Equipment: Conservation officer, nature (2 wheels) Additional Comments: was not using prior to admission    Prior Function Prior Level of Function : Independent/Modified Independent                     Hand Dominance   Dominant Hand: Right    Extremity/Trunk Assessment   Upper Extremity Assessment Upper Extremity Assessment: Overall WFL for tasks assessed    Lower Extremity Assessment Lower  Extremity Assessment: Overall WFL for tasks assessed    Cervical / Trunk Assessment Cervical / Trunk Assessment: Normal  Communication   Communication: No difficulties  Cognition Arousal/Alertness: Awake/alert Behavior During Therapy: WFL for tasks assessed/performed Overall Cognitive Status: Within Functional Limits for tasks assessed                                          General Comments      Exercises     Assessment/Plan    PT Assessment Patient needs continued PT services  PT Problem List Decreased strength;Decreased activity tolerance;Cardiopulmonary status limiting activity;Obesity       PT Treatment Interventions Gait training;Functional mobility training;Therapeutic activities;Patient/family education;Therapeutic exercise    PT Goals (Current goals can be found in the Care Plan section)  Acute Rehab PT Goals Patient Stated Goal: to return home PT Goal Formulation: With patient Time For Goal Achievement: 04/09/22 Potential to Achieve Goals: Good    Frequency Min 2X/week     Co-evaluation               AM-PAC PT "6 Clicks" Mobility  Outcome Measure Help needed turning from your back to your side while in a flat bed without using bedrails?: A Little Help needed moving from lying on your back to sitting on the side of a flat bed without using bedrails?: A Little Help needed moving to and from a bed to a chair (including a wheelchair)?: A Little Help needed standing up from a chair using your arms (e.g., wheelchair or bedside chair)?: A Little Help needed to walk in hospital room?: A Little Help needed climbing 3-5 steps with a railing? : A Little 6 Click Score: 18    End of Session Equipment Utilized During Treatment: Gait belt;Oxygen Activity Tolerance: Patient tolerated treatment well Patient left: in chair;with call bell/phone within reach Nurse Communication: Mobility status PT Visit Diagnosis: Muscle weakness (generalized)  (M62.81)    Time: 4967-5916 PT Time Calculation (min) (ACUTE ONLY): 18 min   Charges:   PT Evaluation $PT Eval Moderate Complexity: 1 Mod PT Treatments $Gait Training: 8-22 mins        Dorsie Burich, PT, GCS 04/02/22,11:51 AM

## 2022-04-02 NOTE — Discharge Instructions (Signed)

## 2022-04-02 NOTE — Progress Notes (Signed)
PROGRESS NOTE    Cristobal Advani Gartman  QVZ:[id number] DOB: 04-04-1951 DOA: 04/01/2022 PCP: [name], MD   Brief Narrative: Per Dr. Kendrick Ranch Carmon is a 71 y.o. male with medical history significant of HTN, HLD, HFpEF, PAF, PSVT CAD, COPD, CKD stage III/IV, diabetes mellitus type 2, anxiety, and tobacco abuse presents with complaints of progressively worsening shortness of breath.  Patient admits that his shortness of breath worsens with any kind of activity.  Over the last 2 weeks he has gained approximately 20 pounds.  Reports associated symptoms of bilateral lower extremity swelling and orthopnea.  For at least the last week he had increased his Lasix from 40 mg twice daily to 80 mg twice daily without significant change in his symptoms.  He also had notified his nephrologist Dr. Holley Raring in regards to his weight gain and had been set up to received outpatient IV Lasix infusions.  His last infusion was 4 days ago, but over the holiday weekend and his symptoms worsen and he was advised to come to the hospital for further evaluation.  During this time he has not had any significant fever, cough, nausea, vomiting, diarrhea, or abdominal pain symptoms.  Patient reports that he still smokes about a pack cigarettes per day on average. Need of dialysis had not been brought up.   Upon admission to the emergency department patient was noted to be afebrile, tachypneic, and O2 saturations as low as 78% with improvement to greater than 92% on 3 L of nasal cannula oxygen.  Labs were significant for potassium 5.2, BUN 69, creatinine 3.94, BNP 267.2, and high-sensitivity troponin 22.  Chest x-ray showed stable appearance without active disease.  Patient was given Lasix 80 mg IV. Assessment & Plan:   Principal Problem:   Acute respiratory failure with hypoxia (HCC) Active Problems:   (HFpEF) heart failure with preserved ejection fraction (HCC)   Hyperkalemia   Elevated troponin   Abnormal EKG    Chronic kidney disease   Iron deficiency anemia   CAD (coronary artery disease)   COPD (chronic obstructive pulmonary disease) (HCC)   T2DM (type 2 diabetes mellitus) ([place])   Hyperlipidemia   Tobacco abuse   Essential hypertension   Anxiety    #1 acute hypoxic respiratory failure due to heart failure with diastolic dysfunction complicated by CKD stage IV. He is admitted with 20 pound weight gain and bilateral lower extremity swelling PND and orthopnea. Patient has been on Lasix drip followed by nephrology. Influenza and COVID-negative. Continue Plavix and metoprolol He is negative by 1429 Echocardiogram 04/01/2022-ejection fraction 60 to 65%.  Severe LVH.  Right ventricular systolic function is mildly reduced.  Severely elevated pulmonary artery systolic pressure.  #2 CKD stage IV-creatinine 3.62 from 3.94 on Lasix drip.  Need to monitor renal functions closely.  Patient is still with bilateral lower extremity edema orthopnea and PND and unable to lay flat.  Still O2 dependent.  #3 hyperlipidemia on statin Fenofibrate stopped due to CKD   #4 tobacco abuse continue nicotine patch  #5 history of essential hypertension on metoprolol, losartan on hold  #6 hyperkalemia resolved losartan on hold  #7 type 2 diabetes on metformin and Amaryl prior to admission.  Metformin stopped due to elevated creatinine.  Amaryl on hold.  Continue SSI. CBG (last 3)  Recent Labs    04/01/22 2203 04/02/22 0811 04/02/22 1136  GLUCAP 152* 116* 291*    Estimated body mass index is 37.26 kg/m as calculated from the following:  Height as of 03/18/22: '5\' 10"'$  (1.778 m).   Weight as of this encounter: 117.8 kg.  DVT prophylaxis: Heparin  code Status: Full code  family Communication: None at bedside  disposition Plan:  Status is: Inpatient Remains inpatient appropriate because: Fluid overload on IV Lasix drip   Consultants:  Nephrology Cardiology  Procedures: None Antimicrobials:  None  Subjective: Patient sitting up by the side of the bed waiting for breakfast.  He reports he sat up in bed most of the night.  He was unable to lay back and lay flat due to breathing difficulties.  He continues to have 3+ pitting lower extremity edema.  He remains on 4 L of oxygen.  Objective: Vitals:   04/02/22 0500 04/02/22 0744 04/02/22 1056 04/02/22 1144  BP:  (!) 130/57 121/62   Pulse:  87 85   Resp:  18 20   Temp:  97.9 F (36.6 C) 97.7 F (36.5 C)   TempSrc:  Oral Oral   SpO2:  96% 95% 90%  Weight: 117.8 kg       Intake/Output Summary (Last 24 hours) at 04/02/2022 1616 Last data filed at 04/02/2022 1402 Gross per 24 hour  Intake 725.71 ml  Output 2155 ml  Net -1429.29 ml   Filed Weights   04/02/22 0500  Weight: 117.8 kg    Examination:  General exam: Appears not in acute distress Respiratory system: Crackles to auscultation. Respiratory effort normal. Cardiovascular system: S1 & S2 heard, RRR. No JVD, murmurs, rubs, gallops or clicks. No pedal edema. Gastrointestinal system: Abdomen is nondistended, soft and nontender. No organomegaly or masses felt. Normal bowel sounds heard. Central nervous system: Alert and oriented. No focal neurological deficits. Extremities: 2+ pitting edema    Data Reviewed: I have personally reviewed following labs and imaging studies  CBC: Recent Labs  Lab 04/01/22 1052 04/02/22 0418  WBC 7.1 6.6  HGB 11.5* 11.4*  HCT 39.9 39.7  MCV 77.6* 77.5*  PLT 242 542   Basic Metabolic Panel: Recent Labs  Lab 03/28/22 1349 04/01/22 1052 04/02/22 0418  NA 136 138 140  K 6.0* 5.2* 4.3  CL 97* 97* 99  CO2 '29 31 31  '$ GLUCOSE 67* 165* 126*  BUN 82* 69* 63*  CREATININE 5.45* 3.94* 3.62*  CALCIUM 8.8* 9.6 9.4  PHOS 5.6*  --   --    GFR: Estimated Creatinine Clearance: 23.7 mL/min (A) (by C-G formula based on SCr of 3.62 mg/dL (H)). Liver Function Tests: Recent Labs  Lab 03/28/22 1349  ALBUMIN 3.8   No results for input(s):  "LIPASE", "AMYLASE" in the last 168 hours. No results for input(s): "AMMONIA" in the last 168 hours. Coagulation Profile: No results for input(s): "INR", "PROTIME" in the last 168 hours. Cardiac Enzymes: No results for input(s): "CKTOTAL", "CKMB", "CKMBINDEX", "TROPONINI" in the last 168 hours. BNP (last 3 results) No results for input(s): "PROBNP" in the last 8760 hours. HbA1C: No results for input(s): "HGBA1C" in the last 72 hours. CBG: Recent Labs  Lab 04/01/22 1701 04/01/22 2203 04/02/22 0811 04/02/22 1136  GLUCAP 128* 152* 116* 291*   Lipid Profile: No results for input(s): "CHOL", "HDL", "LDLCALC", "TRIG", "CHOLHDL", "LDLDIRECT" in the last 72 hours. Thyroid Function Tests: Recent Labs    04/01/22 1406  TSH 2.088   Anemia Panel: No results for input(s): "VITAMINB12", "FOLATE", "FERRITIN", "TIBC", "IRON", "RETICCTPCT" in the last 72 hours. Sepsis Labs: Recent Labs  Lab 04/01/22 1052  PROCALCITON <0.10    Recent Results (from the past  240 hour(s))  Resp Panel by RT-PCR (Flu A&B, Covid) Anterior Nasal Swab     Status: None   Collection Time: 04/01/22 11:25 AM   Specimen: Anterior Nasal Swab  Result Value Ref Range Status   SARS Coronavirus 2 by RT PCR NEGATIVE NEGATIVE Final    Comment: (NOTE) SARS-CoV-2 target nucleic acids are NOT DETECTED.  The SARS-CoV-2 RNA is generally detectable in upper respiratory specimens during the acute phase of infection. The lowest concentration of SARS-CoV-2 viral copies this assay can detect is 138 copies/mL. A negative result does not preclude SARS-Cov-2 infection and should not be used as the sole basis for treatment or other patient management decisions. A negative result may occur with  improper specimen collection/handling, submission of specimen other than nasopharyngeal swab, presence of viral mutation(s) within the areas targeted by this assay, and inadequate number of viral copies(<138 copies/mL). A negative result  must be combined with clinical observations, patient history, and epidemiological information. The expected result is Negative.  Fact Sheet for Patients:  EntrepreneurPulse.com.au  Fact Sheet for Healthcare Providers:  IncredibleEmployment.be  This test is no t yet approved or cleared by the [place] FDA and  has been authorized for detection and/or diagnosis of SARS-CoV-2 by FDA under an Emergency Use Authorization (EUA). This EUA will remain  in effect (meaning this test can be used) for the duration of the COVID-19 declaration under Section 564(b)(1) of the Act, 21 U.S.C.section 360bbb-3(b)(1), unless the authorization is terminated  or revoked sooner.       Influenza A by PCR NEGATIVE NEGATIVE Final   Influenza B by PCR NEGATIVE NEGATIVE Final    Comment: (NOTE) The Xpert Xpress SARS-CoV-2/FLU/RSV plus assay is intended as an aid in the diagnosis of influenza from Nasopharyngeal swab specimens and should not be used as a sole basis for treatment. Nasal washings and aspirates are unacceptable for Xpert Xpress SARS-CoV-2/FLU/RSV testing.  Fact Sheet for Patients: EntrepreneurPulse.com.au  Fact Sheet for Healthcare Providers: IncredibleEmployment.be  This test is not yet approved or cleared by the [place] FDA and has been authorized for detection and/or diagnosis of SARS-CoV-2 by FDA under an Emergency Use Authorization (EUA). This EUA will remain in effect (meaning this test can be used) for the duration of the COVID-19 declaration under Section 564(b)(1) of the Act, 21 U.S.C. section 360bbb-3(b)(1), unless the authorization is terminated or revoked.  Performed at [facility], [address] [place], [place] [address]          Radiology Studies: ECHOCARDIOGRAM COMPLETE  Result Date: 04/01/2022    ECHOCARDIOGRAM REPORT   Patient Name:   SHREYANSH TIFFANY [facility] Date of Exam:  04/01/2022 Medical Rec #:  [id number]            Height:       70.0 in Accession #:    [id number]           Weight:       266.0 lb Date of Birth:  Oct 27, 1950           BSA:          2.356 m Patient Age:    39 years             BP:           103/77 mmHg Patient Gender: M                    HR:           83  bpm. Exam Location:  [facility] Procedure: 2D Echo, Cardiac Doppler and Color Doppler Indications:     Elevated Troponin  History:         Patient has prior history of Echocardiogram examinations, most                  recent 08/12/2021. CHF, CAD, Prior CABG and Carotid                  endarterectomy, COPD, Arrythmias:Atrial Fibrillation; Risk                  Factors:Current Smoker, Diabetes, Hypertension, Dyslipidemia                  and Sleep Apnea.  Sonographer:     Rosalia Hammers Referring Phys:  [id number] RONDELL A SMITH Diagnosing Phys: Nelva Bush MD  Sonographer Comments: Suboptimal apical window. Image acquisition challenging due to patient body habitus, Image acquisition challenging due to COPD and Image acquisition challenging due to respiratory motion. IMPRESSIONS  1. Left ventricular ejection fraction, by estimation, is 60 to 65%. The left ventricle has normal function. Left ventricular endocardial border not optimally defined to evaluate regional wall motion. There is severe left ventricular hypertrophy. Left ventricular diastolic parameters are indeterminate.  2. Right ventricular systolic function is mildly reduced. The right ventricular size is moderately enlarged. Severely increased right ventricular wall thickness. There is severely elevated pulmonary artery systolic pressure. The estimated right ventricular systolic pressure is 50.0 mmHg.  3. Left atrial size was upper normal.  4. Right atrial size was mildly dilated.  5. The mitral valve is abnormal. Trivial mitral valve regurgitation.  6. Tricuspid valve regurgitation is mild to moderate.  7. The aortic valve is tricuspid. There is moderate  calcification of the aortic valve. There is moderate thickening of the aortic valve. Aortic valve regurgitation is trivial. Aortic valve sclerosis/calcification is present, without any evidence of aortic stenosis.  8. Aortic dilatation noted. There is borderline dilatation of the aortic root, measuring 38 mm.  9. The inferior vena cava is dilated in size with <50% respiratory variability, suggesting right atrial pressure of 15 mmHg. FINDINGS  Left Ventricle: Left ventricular ejection fraction, by estimation, is 60 to 65%. The left ventricle has normal function. Left ventricular endocardial border not optimally defined to evaluate regional wall motion. The left ventricular internal cavity size was normal in size. There is severe left ventricular hypertrophy. Left ventricular diastolic parameters are indeterminate. Right Ventricle: The right ventricular size is moderately enlarged. Severely increased right ventricular wall thickness. Right ventricular systolic function is mildly reduced. There is severely elevated pulmonary artery systolic pressure. The tricuspid regurgitant velocity is 3.98 m/s, and with an assumed right atrial pressure of 15 mmHg, the estimated right ventricular systolic pressure is 93.8 mmHg. Left Atrium: Left atrial size was upper normal. Right Atrium: Right atrial size was mildly dilated. Pericardium: There is no evidence of pericardial effusion. Mitral Valve: The mitral valve is abnormal. There is mild thickening of the mitral valve leaflet(s). Trivial mitral valve regurgitation. Tricuspid Valve: The tricuspid valve is normal in structure. Tricuspid valve regurgitation is mild to moderate. Aortic Valve: The aortic valve is tricuspid. There is moderate calcification of the aortic valve. There is moderate thickening of the aortic valve. Aortic valve regurgitation is trivial. Aortic valve sclerosis/calcification is present, without any evidence of aortic stenosis. Aortic valve mean gradient  measures 6.0 mmHg. Aortic valve peak gradient measures 8.4 mmHg. Aortic valve area, by VTI measures  2.11 cm. Pulmonic Valve: The pulmonic valve was normal in structure. Pulmonic valve regurgitation is trivial. No evidence of pulmonic stenosis. Aorta: Aortic dilatation noted. There is borderline dilatation of the aortic root, measuring 38 mm. Pulmonary Artery: The pulmonary artery is of normal size. Venous: The inferior vena cava is dilated in size with less than 50% respiratory variability, suggesting right atrial pressure of 15 mmHg. IAS/Shunts: The interatrial septum was not well visualized.  LEFT VENTRICLE PLAX 2D LVIDd:         4.91 cm LVIDs:         3.03 cm LV PW:         1.65 cm LV IVS:        1.70 cm LVOT diam:     2.00 cm LV SV:         62 LV SV Index:   26 LVOT Area:     3.14 cm  RIGHT VENTRICLE RV Basal diam:  5.25 cm TAPSE (M-mode): 1.4 cm LEFT ATRIUM             Index        RIGHT ATRIUM           Index LA diam:        4.00 cm 1.70 cm/m   RA Area:     22.60 cm LA Vol (A2C):   71.3 ml 30.26 ml/m  RA Volume:   61.20 ml  25.97 ml/m LA Vol (A4C):   63.2 ml 26.82 ml/m LA Biplane Vol: 71.3 ml 30.26 ml/m  AORTIC VALVE                     PULMONIC VALVE AV Area (Vmax):    2.04 cm      PR End Diast Vel: 9.73 msec AV Area (Vmean):   1.99 cm AV Area (VTI):     2.11 cm AV Vmax:           145.00 cm/s AV Vmean:          115.000 cm/s AV VTI:            0.292 m AV Peak Grad:      8.4 mmHg AV Mean Grad:      6.0 mmHg LVOT Vmax:         94.20 cm/s LVOT Vmean:        73.000 cm/s LVOT VTI:          0.196 m LVOT/AV VTI ratio: 0.67  AORTA Ao Root diam: 3.80 cm MITRAL VALVE                TRICUSPID VALVE MV Area (PHT): 4.60 cm     TR Peak grad:   63.4 mmHg MV Decel Time: 165 msec     TR Vmax:        398.00 cm/s MV E velocity: 123.00 cm/s                             SHUNTS                             Systemic VTI:  0.20 m                             Systemic Diam: 2.00 cm Nelva Bush MD Electronically signed by  Nelva Bush MD Signature Date/Time: 04/01/2022/5:08:58  PM    Final    DG Chest 2 View  Result Date: 04/01/2022 CLINICAL DATA:  71 year old male with shortness of breath, fluid overload. EXAM: CHEST - 2 VIEW COMPARISON:  August 09, 2021 FINDINGS: Trachea midline. Cardiomediastinal contours and hilar structures are stable. Central pulmonary vasculature suggest mild engorgement. No signs of frank edema.  No lobar consolidation. No pneumothorax. On limited assessment no acute skeletal findings. 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 IMPRESSION: No active cardiopulmonary disease. Electronically Signed   By: Zetta Bills M.D.   On: 04/01/2022 11:24        Scheduled Meds:  atorvastatin  80 mg Oral QPM   clopidogrel  75 mg Oral Daily   heparin  5,000 Units Subcutaneous Q8H   insulin aspart  0-6 Units Subcutaneous TID WC   metoprolol tartrate  25 mg Oral BID   nicotine  21 mg Transdermal Daily   pantoprazole  40 mg Oral QAC breakfast   sodium chloride flush  3 mL Intravenous Q12H   traZODone  50 mg Oral QHS   Continuous Infusions:  furosemide (LASIX) 200 mg in dextrose 5 % 100 mL (2 mg/mL) infusion 6 mg/hr (04/01/22 1718)     LOS: 1 day    Time spent: 35 minutes    Georgette Shell, MD 04/02/2022, 4:16 PM

## 2022-04-02 NOTE — Progress Notes (Signed)
[facility]  ROUNDING NOTE   Subjective:   Joshua Roy is a 71 year old male with past medical conditions including hyperlipidemia, hypertension, COPD, diabetes, and chronic kidney disease.  Patient presents to the emergency department with shortness of breath and has been admitted for COPD exacerbation ([place]) [J44.1] Acute respiratory failure with hypoxia ([place]) [J96.01] Congestive heart failure, unspecified HF chronicity, unspecified heart failure type [facility]) [I50.9]  Patient is known to our practice and is followed by Dr. Zollie Scale outpatient.    Patient seen sitting at bedside, eating breakfast Alert and oriented Remains on 4L [place] Improved respiratory status Lower extremity edema remains  Creatinine 3.62 UOP 1.8L in preceding 24 hours.   Objective:  Vital signs in last 24 hours:  Temp:  [97.4 F (36.3 C)-98.5 F (36.9 C)] 97.7 F (36.5 C) (09/06 1056) Pulse Rate:  [76-87] 85 (09/06 1056) Resp:  [18-20] 20 (09/06 1056) BP: (119-141)/(57-72) 121/62 (09/06 1056) SpO2:  [90 %-96 %] 90 % (09/06 1144) Weight:  [117.8 kg] 117.8 kg (09/06 0500)  Weight change:  Filed Weights   04/02/22 0500  Weight: 117.8 kg    Intake/Output: I/O last 3 completed shifts: In: 275.7 [P.O.:240; I.V.:35.7] Out: 1880 [Urine:1880]   Intake/Output this shift:  Total I/O In: 450 [P.O.:450] Out: -   Physical Exam: General: NAD  Head: Normocephalic, atraumatic. Moist oral mucosal membranes  Eyes: Anicteric  Lungs:  Basilar wheezing, normal effort, [place] O2  Heart: Regular rate and rhythm  Abdomen:  Soft, nontender,   Extremities: 3+ peripheral edema.  Neurologic: Nonfocal, moving all four extremities  Skin: No lesions  Access: None    Basic Metabolic Panel: Recent Labs  Lab 03/28/22 1349 04/01/22 1052 04/02/22 0418  NA 136 138 140  K 6.0* 5.2* 4.3  CL 97* 97* 99  CO2 '29 31 31  '$ GLUCOSE 67* 165* 126*  BUN 82* 69* 63*  CREATININE 5.45* 3.94* 3.62*  CALCIUM 8.8* 9.6  9.4  PHOS 5.6*  --   --      Liver Function Tests: Recent Labs  Lab 03/28/22 1349  ALBUMIN 3.8    No results for input(s): "LIPASE", "AMYLASE" in the last 168 hours. No results for input(s): "AMMONIA" in the last 168 hours.  CBC: Recent Labs  Lab 04/01/22 1052 04/02/22 0418  WBC 7.1 6.6  HGB 11.5* 11.4*  HCT 39.9 39.7  MCV 77.6* 77.5*  PLT 242 234     Cardiac Enzymes: No results for input(s): "CKTOTAL", "CKMB", "CKMBINDEX", "TROPONINI" in the last 168 hours.  BNP: Invalid input(s): "POCBNP"  CBG: Recent Labs  Lab 04/01/22 1701 04/01/22 2203 04/02/22 0811 04/02/22 1136  GLUCAP 128* 152* 116* 291*    Microbiology: Results for orders placed or performed during the hospital encounter of 04/01/22  Resp Panel by RT-PCR (Flu A&B, Covid) Anterior Nasal Swab     Status: None   Collection Time: 04/01/22 11:25 AM   Specimen: Anterior Nasal Swab  Result Value Ref Range Status   SARS Coronavirus 2 by RT PCR NEGATIVE NEGATIVE Final    Comment: (NOTE) SARS-CoV-2 target nucleic acids are NOT DETECTED.  The SARS-CoV-2 RNA is generally detectable in upper respiratory specimens during the acute phase of infection. The lowest concentration of SARS-CoV-2 viral copies this assay can detect is 138 copies/mL. A negative result does not preclude SARS-Cov-2 infection and should not be used as the sole basis for treatment or other patient management decisions. A negative result may occur with  improper specimen collection/handling, submission of  specimen other than nasopharyngeal swab, presence of viral mutation(s) within the areas targeted by this assay, and inadequate number of viral copies(<138 copies/mL). A negative result must be combined with clinical observations, patient history, and epidemiological information. The expected result is Negative.  Fact Sheet for Patients:  EntrepreneurPulse.com.au  Fact Sheet for Healthcare Providers:   IncredibleEmployment.be  This test is no t yet approved or cleared by the [place] FDA and  has been authorized for detection and/or diagnosis of SARS-CoV-2 by FDA under an Emergency Use Authorization (EUA). This EUA will remain  in effect (meaning this test can be used) for the duration of the COVID-19 declaration under Section 564(b)(1) of the Act, 21 U.S.C.section 360bbb-3(b)(1), unless the authorization is terminated  or revoked sooner.       Influenza A by PCR NEGATIVE NEGATIVE Final   Influenza B by PCR NEGATIVE NEGATIVE Final    Comment: (NOTE) The Xpert Xpress SARS-CoV-2/FLU/RSV plus assay is intended as an aid in the diagnosis of influenza from Nasopharyngeal swab specimens and should not be used as a sole basis for treatment. Nasal washings and aspirates are unacceptable for Xpert Xpress SARS-CoV-2/FLU/RSV testing.  Fact Sheet for Patients: EntrepreneurPulse.com.au  Fact Sheet for Healthcare Providers: IncredibleEmployment.be  This test is not yet approved or cleared by the [place] FDA and has been authorized for detection and/or diagnosis of SARS-CoV-2 by FDA under an Emergency Use Authorization (EUA). This EUA will remain in effect (meaning this test can be used) for the duration of the COVID-19 declaration under Section 564(b)(1) of the Act, 21 U.S.C. section 360bbb-3(b)(1), unless the authorization is terminated or revoked.  Performed at [facility], [address] [place], [place] [address]     Coagulation Studies: No results for input(s): "LABPROT", "INR" in the last 72 hours.  Urinalysis: No results for input(s): "COLORURINE", "LABSPEC", "PHURINE", "GLUCOSEU", "HGBUR", "BILIRUBINUR", "KETONESUR", "PROTEINUR", "UROBILINOGEN", "NITRITE", "LEUKOCYTESUR" in the last 72 hours.  Invalid input(s): "APPERANCEUR"    Imaging: ECHOCARDIOGRAM COMPLETE  Result Date: 04/01/2022     ECHOCARDIOGRAM REPORT   Patient Name:   Joshua Roy Date of Exam: 04/01/2022 Medical Rec #:  [id number]            Height:       70.0 in Accession #:    [id number]           Weight:       266.0 lb Date of Birth:  02/04/1951           BSA:          2.356 m Patient Age:    71 years             BP:           103/77 mmHg Patient Gender: M                    HR:           83 bpm. Exam Location:  [facility] Procedure: 2D Echo, Cardiac Doppler and Color Doppler Indications:     Elevated Troponin  History:         Patient has prior history of Echocardiogram examinations, most                  recent 08/12/2021. CHF, CAD, Prior CABG and Carotid                  endarterectomy, COPD, Arrythmias:Atrial Fibrillation; Risk  Factors:Current Smoker, Diabetes, Hypertension, Dyslipidemia                  and Sleep Apnea.  Sonographer:     Rosalia Hammers Referring Phys:  [id number] RONDELL A SMITH Diagnosing Phys: Nelva Bush MD  Sonographer Comments: Suboptimal apical window. Image acquisition challenging due to patient body habitus, Image acquisition challenging due to COPD and Image acquisition challenging due to respiratory motion. IMPRESSIONS  1. Left ventricular ejection fraction, by estimation, is 60 to 65%. The left ventricle has normal function. Left ventricular endocardial border not optimally defined to evaluate regional wall motion. There is severe left ventricular hypertrophy. Left ventricular diastolic parameters are indeterminate.  2. Right ventricular systolic function is mildly reduced. The right ventricular size is moderately enlarged. Severely increased right ventricular wall thickness. There is severely elevated pulmonary artery systolic pressure. The estimated right ventricular systolic pressure is 69.6 mmHg.  3. Left atrial size was upper normal.  4. Right atrial size was mildly dilated.  5. The mitral valve is abnormal. Trivial mitral valve regurgitation.  6. Tricuspid valve regurgitation is  mild to moderate.  7. The aortic valve is tricuspid. There is moderate calcification of the aortic valve. There is moderate thickening of the aortic valve. Aortic valve regurgitation is trivial. Aortic valve sclerosis/calcification is present, without any evidence of aortic stenosis.  8. Aortic dilatation noted. There is borderline dilatation of the aortic root, measuring 38 mm.  9. The inferior vena cava is dilated in size with <50% respiratory variability, suggesting right atrial pressure of 15 mmHg. FINDINGS  Left Ventricle: Left ventricular ejection fraction, by estimation, is 60 to 65%. The left ventricle has normal function. Left ventricular endocardial border not optimally defined to evaluate regional wall motion. The left ventricular internal cavity size was normal in size. There is severe left ventricular hypertrophy. Left ventricular diastolic parameters are indeterminate. Right Ventricle: The right ventricular size is moderately enlarged. Severely increased right ventricular wall thickness. Right ventricular systolic function is mildly reduced. There is severely elevated pulmonary artery systolic pressure. The tricuspid regurgitant velocity is 3.98 m/s, and with an assumed right atrial pressure of 15 mmHg, the estimated right ventricular systolic pressure is 78.9 mmHg. Left Atrium: Left atrial size was upper normal. Right Atrium: Right atrial size was mildly dilated. Pericardium: There is no evidence of pericardial effusion. Mitral Valve: The mitral valve is abnormal. There is mild thickening of the mitral valve leaflet(s). Trivial mitral valve regurgitation. Tricuspid Valve: The tricuspid valve is normal in structure. Tricuspid valve regurgitation is mild to moderate. Aortic Valve: The aortic valve is tricuspid. There is moderate calcification of the aortic valve. There is moderate thickening of the aortic valve. Aortic valve regurgitation is trivial. Aortic valve sclerosis/calcification is present,  without any evidence of aortic stenosis. Aortic valve mean gradient measures 6.0 mmHg. Aortic valve peak gradient measures 8.4 mmHg. Aortic valve area, by VTI measures 2.11 cm. Pulmonic Valve: The pulmonic valve was normal in structure. Pulmonic valve regurgitation is trivial. No evidence of pulmonic stenosis. Aorta: Aortic dilatation noted. There is borderline dilatation of the aortic root, measuring 38 mm. Pulmonary Artery: The pulmonary artery is of normal size. Venous: The inferior vena cava is dilated in size with less than 50% respiratory variability, suggesting right atrial pressure of 15 mmHg. IAS/Shunts: The interatrial septum was not well visualized.  LEFT VENTRICLE PLAX 2D LVIDd:         4.91 cm LVIDs:         3.03 cm  LV PW:         1.65 cm LV IVS:        1.70 cm LVOT diam:     2.00 cm LV SV:         62 LV SV Index:   26 LVOT Area:     3.14 cm  RIGHT VENTRICLE RV Basal diam:  5.25 cm TAPSE (M-mode): 1.4 cm LEFT ATRIUM             Index        RIGHT ATRIUM           Index LA diam:        4.00 cm 1.70 cm/m   RA Area:     22.60 cm LA Vol (A2C):   71.3 ml 30.26 ml/m  RA Volume:   61.20 ml  25.97 ml/m LA Vol (A4C):   63.2 ml 26.82 ml/m LA Biplane Vol: 71.3 ml 30.26 ml/m  AORTIC VALVE                     PULMONIC VALVE AV Area (Vmax):    2.04 cm      PR End Diast Vel: 9.73 msec AV Area (Vmean):   1.99 cm AV Area (VTI):     2.11 cm AV Vmax:           145.00 cm/s AV Vmean:          115.000 cm/s AV VTI:            0.292 m AV Peak Grad:      8.4 mmHg AV Mean Grad:      6.0 mmHg LVOT Vmax:         94.20 cm/s LVOT Vmean:        73.000 cm/s LVOT VTI:          0.196 m LVOT/AV VTI ratio: 0.67  AORTA Ao Root diam: 3.80 cm MITRAL VALVE                TRICUSPID VALVE MV Area (PHT): 4.60 cm     TR Peak grad:   63.4 mmHg MV Decel Time: 165 msec     TR Vmax:        398.00 cm/s MV E velocity: 123.00 cm/s                             SHUNTS                             Systemic VTI:  0.20 m                              Systemic Diam: 2.00 cm Nelva Bush MD Electronically signed by Nelva Bush MD Signature Date/Time: 04/01/2022/5:08:58 PM    Final    DG Chest 2 View  Result Date: 04/01/2022 CLINICAL DATA:  71 year old male with shortness of breath, fluid overload. EXAM: CHEST - 2 VIEW COMPARISON:  August 09, 2021 FINDINGS: Trachea midline. Cardiomediastinal contours and hilar structures are stable. Central pulmonary vasculature suggest mild engorgement. No signs of frank edema.  No lobar consolidation. No pneumothorax. On limited assessment no acute skeletal findings. 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 IMPRESSION: No active cardiopulmonary disease. Electronically Signed   By: Zetta Bills M.D.   On: 04/01/2022  11:24     Medications:    furosemide (LASIX) 200 mg in dextrose 5 % 100 mL (2 mg/mL) infusion 6 mg/hr (04/01/22 1718)    atorvastatin  80 mg Oral QPM   clopidogrel  75 mg Oral Daily   heparin  5,000 Units Subcutaneous Q8H   insulin aspart  0-6 Units Subcutaneous TID WC   metoprolol tartrate  25 mg Oral BID   nicotine  21 mg Transdermal Daily   pantoprazole  40 mg Oral QAC breakfast   sodium chloride flush  3 mL Intravenous Q12H   traZODone  50 mg Oral QHS   acetaminophen **OR** acetaminophen, albuterol, ALPRAZolam, ondansetron **OR** ondansetron (ZOFRAN) IV  Assessment/ Plan:  Mr. Joshua Roy is a 71 y.o.  male with past medical conditions including hyperlipidemia, hypertension, COPD, diabetes, and chronic kidney disease.  Patient presents to the emergency department with shortness of breath and has been admitted for COPD exacerbation ([place]) [J44.1] Acute respiratory failure with hypoxia ([place]) [J96.01] Congestive heart failure, unspecified HF chronicity, unspecified heart failure type ([place]) [I50.9]    Acute Kidney Injury on chronic kidney disease stage IV with baseline creatinine 2.09 and GFR of 33 on 12/24/21.  Acute kidney  injury secondary to aggressive outpatient diuresis and increase fluid volume.  No IV contrast exposure.    Receiving furosemide drip. Creatinine improved. Adequate urine output. Continue drip for now.   Lab Results  Component Value Date   CREATININE 3.62 (H) 04/02/2022   CREATININE 3.94 (H) 04/01/2022   CREATININE 5.45 (H) 03/28/2022    Intake/Output Summary (Last 24 hours) at 04/02/2022 1234 Last data filed at 04/02/2022 0828 Gross per 24 hour  Intake 725.71 ml  Output 1580 ml  Net -854.29 ml    2.  Hypertension with chronic kidney disease.  Home regimen includes furosemide, losartan and metoprolol.  Currently receiving metoprolol with furosemide drip. Stable BP  3. Anemia of chronic kidney disease Lab Results  Component Value Date   HGB 11.4 (L) 04/02/2022    Hemoglobin at goal.     LOS: 1 [place] 9/6/202312:34 PM

## 2022-04-03 ENCOUNTER — Encounter: Payer: Self-pay | Admitting: Internal Medicine

## 2022-04-03 ENCOUNTER — Other Ambulatory Visit (HOSPITAL_COMMUNITY): Payer: Self-pay

## 2022-04-03 ENCOUNTER — Inpatient Hospital Stay: Payer: Medicare Other | Attending: Internal Medicine

## 2022-04-03 ENCOUNTER — Telehealth (HOSPITAL_COMMUNITY): Payer: Self-pay

## 2022-04-03 DIAGNOSIS — I483 Typical atrial flutter: Secondary | ICD-10-CM

## 2022-04-03 DIAGNOSIS — J9601 Acute respiratory failure with hypoxia: Secondary | ICD-10-CM | POA: Diagnosis not present

## 2022-04-03 DIAGNOSIS — N184 Chronic kidney disease, stage 4 (severe): Secondary | ICD-10-CM

## 2022-04-03 DIAGNOSIS — E1159 Type 2 diabetes mellitus with other circulatory complications: Secondary | ICD-10-CM

## 2022-04-03 LAB — BASIC METABOLIC PANEL
Anion gap: 11 (ref 5–15)
BUN: 62 mg/dL — ABNORMAL HIGH (ref 8–23)
CO2: 33 mmol/L — ABNORMAL HIGH (ref 22–32)
Calcium: 9.6 mg/dL (ref 8.9–10.3)
Chloride: 94 mmol/L — ABNORMAL LOW (ref 98–111)
Creatinine, Ser: 3.51 mg/dL — ABNORMAL HIGH (ref 0.61–1.24)
GFR, Estimated: 18 mL/min — ABNORMAL LOW (ref >60)
Glucose, Bld: 143 mg/dL — ABNORMAL HIGH (ref 70–99)
Potassium: 4.4 mmol/L (ref 3.5–5.1)
Sodium: 138 mmol/L (ref 135–145)

## 2022-04-03 LAB — GLUCOSE, CAPILLARY
Glucose-Capillary: 150 mg/dL — ABNORMAL HIGH (ref 70–99)
Glucose-Capillary: 220 mg/dL — ABNORMAL HIGH (ref 70–99)
Glucose-Capillary: 236 mg/dL — ABNORMAL HIGH (ref 70–99)
Glucose-Capillary: 225 mg/dL — ABNORMAL HIGH (ref 70–99)

## 2022-04-03 MED ORDER — SODIUM CHLORIDE 0.9 % IV SOLN
510.0000 mg | Freq: Once | INTRAVENOUS | Status: AC
  Administered 2022-04-03: 510 mg via INTRAVENOUS
  Filled 2022-04-03: qty 17

## 2022-04-03 MED ORDER — APIXABAN 5 MG PO TABS
5.0000 mg | ORAL_TABLET | Freq: Two times a day (BID) | ORAL | Status: DC
  Administered 2022-04-03 – 2022-04-04 (×3): 5 mg via ORAL
  Filled 2022-04-03 (×3): qty 1

## 2022-04-03 NOTE — TOC Progression Note (Signed)
Transition of Care [facility]) - Progression Note    Patient Details  Name: Joshua Roy MRN: [id number] Date of Birth: 02-Feb-1951  Transition of Care [facility]) CM/SW [place], [place] Amariyah Bazar Phone Number: 04/03/2022, 2:57 PM  Clinical Narrative:     Per PT patient is agreeable now to therapy, is set up with [facility] for PT.   Expected Discharge Plan: [place] Barriers to Discharge: Continued Medical Work up  Expected Discharge Plan and Services Expected Discharge Plan: [place] arrangements for the past 2 months: Single Family Home                                       Social Determinants of Health (SDOH) Interventions    Readmission Risk Interventions    12/08/2019   12:59 PM  Readmission Risk Prevention Plan  Transportation Screening Complete  PCP or Specialist Appt within 3-5 Days Complete  HRI or Home Care Consult Complete  Medication Review (RN Care Manager) Complete

## 2022-04-03 NOTE — Consult Note (Signed)
Cardiology Consultation:   Patient ID: Joshua Roy; [id number]; 06/23/1951   Admit date: 04/01/2022 Date of Consult: 04/03/2022  Primary Care Provider: [name], MD Primary Cardiologist: Rockey Situ Primary Electrophysiologist:  None   Patient Profile:   Joshua Roy is a 71 y.o. male with a hx of CAD status post CABG in 09/2019 with postoperative A-fib, HFpEF, DM 2, carotid artery disease status post CEA in 09/2019, CKD stage IV, HTN, HLD, obesity, and COPD with ongoing tobacco use who is being seen today for the evaluation of volume overload at the request of Dr. Rodena Piety.  History of Present Illness:   [name] was admitted to the hospital in 2021 with presyncope followed by brief episode of syncope, though left AMA.  He was subsequently found to be volume overloaded with EF at that time showing preserved LV systolic function with diastolic dysfunction and no significant valvular disease.  LHC showed severe multivessel CAD.  He underwent CABG x4 in 09/2019.  Preoperative work-up showed severe right ICA stenosis was treated with CEA at the time of his CABG.  Postoperative course was noted for A-fib which was treated with amiodarone without recurrence.  He had a subsequent admission in 11/2019 with CHF exacerbation and multiorgan failure requiring significant supplemental oxygen at 2 L throughout his course.  Note indicates the patient was "in denial as to his numerous medical issues."  He has not required ischemic evaluation since his CABG.  He was admitted to the hospital in 07/2021 with acute respiratory failure secondary to COVID-pneumonia and HFpEF.  Echo during that admission demonstrated an EF of 55 to 60%, indeterminate LV diastolic function parameters, normal RV systolic function and ventricular cavity size, mild biatrial enlargement, and aortic valve sclerosis without evidence of stenosis.  He was seen in 11/2021 with an increase in lower extremity edema that improved with  titration of furosemide and addition of [place].  He was admitted to [facility] on 04/01/2022 with a 2-week history of increased shortness of breath and lower extremity swelling.  He was without symptoms of frank chest pain, palpitations, presyncope, or syncope.  He had gained approximately 20 pounds over that 2-week timeframe despite escalation of furosemide.  He reported adherence to his medications.  Upon admission, he was noted to be hypoxic with oxygen saturation of 78%, potassium 5.2 trending to 6.0, serum creatinine 3.94 trending to 5.45, BNP 267, and high-sensitivity troponin 22x2.  Chest x-ray showed no active cardiopulmonary disease.  EKG showed new onset atrial flutter with variable AV block.  Throughout his admission, he has been managed with Lasix drip with the assistance of nephrology with documented urine output 3.6 L for the admission with improvement in serum creatinine currently at 3.51.  He reports his dyspnea is improving though not yet back at baseline.  He is without symptoms of chest pain or palpitations.  He remains on supplemental oxygen at 4 L via nasal cannula.  Echo this admission demonstrated an EF of 60 to 65%, severe LVH, mildly reduced RV systolic function with moderately enlarged ventricular cavity size, severely elevated PASP estimated at 78.4 mmHg, upper normal left atrial size, mildly dilated right atrium, trivial mitral regurgitation, mild to moderate tricuspid regurgitation, aortic valve sclerosis without evidence of stenosis, borderline dilatation of the aortic root, and an estimated right atrial pressure 15 mmHg.    Past Medical History:  Diagnosis Date   (HFpEF) heart failure with preserved ejection fraction ([place])    a. 07/2019 Echo: EF 60-65%. Mod LVH. Gr1  DD. No rwma. Nl RV size/fxn. Mildly dil LA; b. 11/2019 Echo: EF 55-60%, no rwma, mild LVH. Nl RV size/fxn. Mod dil LA.   ACE-inhibitor cough    Anxiety    Bronchitis 06/27/2016   Carotid arterial disease ([place])    a.  09/2019 Carotid U/S: RICA 44-03%, LICA 4-74%; b. 08/5954 R CEA.   CKD (chronic kidney disease), stage III (HCC)    COPD (chronic obstructive pulmonary disease) (HCC)    cath, mild inf. hypokinesis EF 49%, 100% ROA   Coronary artery disease    a. 2001 Cath: LM 10, LAD 60p/m, D1 30, LCX 20p, RCA 147md-->Med Rx; b. 05/2017 Ex Mv: Ex time 5:46, antlat twi @ rest, fixed infarct w/ mild peri-infarct ischemia. EF 38%; c. 08/2019 Cath: LM 40ost, 70d, LAD 734mLCX 8052mCA 80-90p, 100m30m 09/2019 CABG x4: LIMA->LAD, VG->RI, VG->OM, VG->RPDA.   Diabetes mellitus type II    Hyperlipidemia    Hypertension    [facility]    knee OA, injected 2012 by ortho   PAF (paroxysmal atrial fibrillation) (HCC)[place] a. 09/2019 post-op CABG-->[place].   Pneumonia    PSVT (paroxysmal supraventricular tachycardia) (HCC)[place] a. 08/2019 Zio: 189 brief runs of SVT (fastest 174 x 6 beats, longest 15 beats @ 107).   Tobacco abuse     Past Surgical History:  Procedure Laterality Date   CARDIAC CATHETERIZATION  [facility]/06/2000   [facility] [place]A 04/20/2018   Procedure: COLONOSCOPY WITH PROPOFOL;  Surgeon: WohlLucilla Lame;  [facility]  Service: Endoscopy;  Laterality: N/A;   CORONARY ARTERY BYPASS GRAFT N/A 10/07/2019   Procedure: CORONARY ARTERY BYPASS GRAFTING (CABG) using LIMA to LAD; Endoscopic harvesting of left greater saphenous vein: SVG to RAMUS; SVG to OM1; SVG to PDA.;  Surgeon: BartGaye Pollack;  [facility]  Service: Open Heart Surgery;  Laterality: N/A;   ENDARTERECTOMY Right 10/07/2019   Procedure: ENDARTERECTOMY CAROTID;  Surgeon: EarlRosetta Posner;  [facility]  Service: Vascular;  Laterality: Right;   ENDOVEIN HARVEST OF GREATER SAPHENOUS VEIN Left 10/07/2019   Procedure: EndoCharleston RopesGreater Saphenous Vein;  Surgeon: BartGaye Pollack;  [facility]  Service: Open Heart Surgery;  Laterality: Left;    ESOPHAGOGASTRODUODENOSCOPY ENDOSCOPY     KNEE ARTHROSCOPY  07/25/04   left   KNEEMalvernight, open surgery- repair   RIGHT/LEFT HEART CATH AND CORONARY ANGIOGRAPHY Bilateral 09/08/2019   Procedure: RIGHT/LEFT HEART CATH AND CORONARY ANGIOGRAPHY;  Surgeon: GollMinna Merritts;  [facility];  Service: Cardiovascular;  Laterality: Bilateral;   TEE WITHOUT CARDIOVERSION N/A 10/07/2019   Procedure: TRANSESOPHAGEAL ECHOCARDIOGRAM (TEE);  Surgeon: BartGaye Pollack;  [facility];  Laterality: N/A;   TOTAL KNEE ARTHROPLASTY Left 07/14/2016   Procedure: LEFT TOTAL KNEE ARTHROPLASTY;  Surgeon: FranGaynelle Arabian;  Location: [facility];  Service: Orthopedics;  Laterality: Left;   TOTAL KNEE ARTHROPLASTY Right 07/13/2017   Procedure: RIGHT TOTAL KNEE ARTHROPLASTY;  Surgeon: AluiGaynelle Arabian;  Location: [facility];  Service: Orthopedics;  Laterality: Right;     Home Meds: Prior to Admission medications   Medication Sig Start Date End Date Taking? Authorizing Provider  ALPRAZolam (XANDuanne Moron5 MG tablet Take 1 tablet (0.5 mg total) by mouth at bedtime as needed for anxiety. 01/02/22  Yes  Theresia Lo, NP  atorvastatin (LIPITOR) 80 MG tablet TAKE 1 TABLET BY MOUTH EVERY DAY 03/19/22  Yes Gollan, Kathlene November, MD  clopidogrel (PLAVIX) 75 MG tablet Take 1 tablet (75 mg total) by mouth daily. 03/25/21  Yes Minna Merritts, MD  fenofibrate 160 MG tablet Take 1 tablet (160 mg total) by mouth daily. 10/01/21  Yes Masoud, Viann Shove, MD  furosemide (LASIX) 40 MG tablet Take 2 tablets (80 mg total) by mouth 2 (two) times daily. Patient taking differently: Take 40 mg by mouth 2 (two) times daily. 08/13/21  Yes Sharen Hones, MD  glimepiride (AMARYL) 2 MG tablet TAKE 1/2 TABLET BY MOUTH IN THE MORNING Patient taking differently: Take 1 mg by mouth daily with breakfast. 03/11/21  Yes Masoud, Viann Shove, MD  iron polysaccharides (NIFEREX) 150 MG capsule Take 1 capsule (150 mg  total) by mouth daily. 03/18/22  Yes Masoud, Viann Shove, MD  losartan (COZAAR) 50 MG tablet Take 50 mg by mouth daily.   Yes [provider]  metFORMIN (GLUCOPHAGE) 1000 MG tablet TAKE 1 TABLET BY MOUTH TWICE A DAY 12/31/21  Yes Masoud, Viann Shove, MD  metoprolol tartrate (LOPRESSOR) 25 MG tablet Take 1 tablet (25 mg total) by mouth 2 (two) times daily. 10/01/21  Yes Masoud, Viann Shove, MD  pantoprazole (PROTONIX) 40 MG tablet TAKE 1 TABLET BY MOUTH DAILY BEFORE BREAKFAST 08/19/21  Yes Masoud, Viann Shove, MD  traZODone (DESYREL) 50 MG tablet TAKE 1 TABLET BY MOUTH EVERY DAY 09/01/21  Yes Masoud, Viann Shove, MD  glucose blood (TRUE METRIX BLOOD GLUCOSE TEST) test strip 1 each by Other route daily. Use as instructed 01/24/20   [name], MD  nitroGLYCERIN (NITROSTAT) 0.4 MG SL tablet Place 0.4 mg under the tongue every 5 (five) minutes as needed for chest pain.    [provider]  sildenafil (REVATIO) 20 MG tablet TAKE ONE TABLET BY MOUTH DAILY AS NEEDED 12/28/20   Beckie Salts, FNP    Inpatient Medications: Scheduled Meds:  apixaban  5 mg Oral BID   atorvastatin  80 mg Oral QPM   insulin aspart  0-6 Units Subcutaneous TID WC   metoprolol tartrate  25 mg Oral BID   nicotine  21 mg Transdermal Daily   pantoprazole  40 mg Oral QAC breakfast   sodium chloride flush  3 mL Intravenous Q12H   traZODone  50 mg Oral QHS   Continuous Infusions:  ferumoxytol (FERAHEME) 510 mg in sodium chloride 0.9 % 100 mL IVPB     furosemide (LASIX) 200 mg in dextrose 5 % 100 mL (2 mg/mL) infusion 6 mg/hr (04/03/22 0337)   PRN Meds: acetaminophen **OR** acetaminophen, albuterol, ALPRAZolam, ondansetron **OR** ondansetron (ZOFRAN) IV  Allergies:   Allergies  Allergen Reactions   Ramipril Cough and Other (See Comments)    REACTION: Cough   Pseudoephedrine-Guaifenesin Er Other (See Comments) and Hypertension    Elevated BP, insomnia Elevated BP, insomnia    Social History:   Social History   Socioeconomic History    Marital status: Married    Spouse name: Janie   Number of children: 3   Years of education: Not on file   Highest education level: Not on file  Occupational History   Occupation: Engineer, manufacturing, Estate agent  Tobacco Use   Smoking status: Every Day    Packs/day: 1.00    Years: 52.00    Total pack years: 52.00    Types: Cigarettes   Smokeless tobacco: Never   Tobacco comments:    per patient  wears a patch and has cut down on cigarettes/day (10/05/2019)  Vaping Use   Vaping Use: Never used  Substance and Sexual Activity   Alcohol use: Yes    Alcohol/week: 0.0 standard drinks of alcohol    Comment: 5-6 cocktails, 1 times a week   Drug use: No   Sexual activity: Not on file  Other Topics Concern   Not on file  Social History Narrative   From [place].  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   Social Determinants of Health   Financial Resource Strain: Low Risk  (06/14/2021)   Overall Financial Resource Strain (CARDIA)    Difficulty of Paying Living Expenses: Not hard at all  Food Insecurity: No Food Insecurity (04/27/2020)   Hunger Vital Sign    Worried About Running Out of Food in the Last Year: Never true    Ran Out of Food in the Last Year: Never true  Transportation Needs: No Transportation Needs (06/14/2021)   PRAPARE - Hydrologist (Medical): No    Lack of Transportation (Non-Medical): No  Physical Activity: Insufficiently Active (06/14/2021)   Exercise Vital Sign    Days of Exercise per Week: 2 days    Minutes of Exercise per Session: 20 min  Stress: No Stress Concern Present (06/14/2021)   [place]    Feeling of Stress : Not at all  Social Connections: Moderately Isolated (06/14/2021)   Social Connection and Isolation Panel [NHANES]    Frequency of Communication with Friends and Family: More than three times a week    Frequency of Social Gatherings with Friends  and Family: More than three times a week    Attends Religious Services: Never    Marine scientist or Organizations: No    Attends Archivist Meetings: Never    Marital Status: Married  Human resources officer Violence: Not At Risk (06/14/2021)   Humiliation, Afraid, Rape, and Kick questionnaire    Fear of Current or Ex-Partner: No    Emotionally Abused: No    Physically Abused: No    Sexually Abused: No     Family History:   Family History  Problem Relation Age of Onset   Heart disease Father        CAD, MI x 3   Hypertension Father    Alcohol abuse Father    Diabetes Father    Diabetes Sister    Cancer Brother        lymphoma, in remission   Colon cancer Brother    Heart disease Sister        CABG x 3   Prostate cancer Neg Hx     ROS:  Review of Systems  Constitutional:  Positive for malaise/fatigue. Negative for chills, diaphoresis, fever and weight loss.  HENT:  Negative for congestion.   Eyes:  Negative for discharge and redness.  Respiratory:  Positive for shortness of breath. Negative for cough, sputum production and wheezing.   Cardiovascular:  Positive for leg swelling. Negative for chest pain, palpitations, orthopnea, claudication and PND.  Gastrointestinal:  Negative for abdominal pain, blood in stool, heartburn, melena, nausea and vomiting.  Musculoskeletal:  Negative for falls and myalgias.  Skin:  Negative for rash.  Neurological:  Positive for weakness. Negative for dizziness, tingling, tremors, sensory change, speech change, focal weakness and loss of consciousness.  Endo/Heme/Allergies:  Does not bruise/bleed easily.  Psychiatric/Behavioral:  Negative for substance abuse. The patient  is not nervous/anxious.   All other systems reviewed and are negative.     Physical Exam/Data:   Vitals:   04/03/22 0339 04/03/22 0500 04/03/22 0814 04/03/22 1000  BP: (!) 110/56  135/77 124/68  Pulse: 78  99 86  Resp: '20  18 20  '$ Temp: 97.8 F (36.6 C)  97.8  F (36.6 C) 97.8 F (36.6 C)  TempSrc: Oral   Oral  SpO2: 90%  96% 94%  Weight:  117.8 kg      Intake/Output Summary (Last 24 hours) at 04/03/2022 1116 Last data filed at 04/03/2022 1000 Gross per 24 hour  Intake 424.98 ml  Output 2595 ml  Net -2170.02 ml   Filed Weights   04/02/22 0500 04/03/22 0500  Weight: 117.8 kg 117.8 kg   Body mass index is 37.25 kg/m.   Physical Exam: General: Well developed, well nourished, in no acute distress. Head: Normocephalic, atraumatic, sclera non-icteric, no xanthomas, nares without discharge.  Neck: Negative for carotid bruits. JVD difficult to assess secondary to body habitus. Lungs: Clear bilaterally to auscultation without wheezes, rales, or rhonchi. Breathing is unlabored. Heart: Irregular with S1 S2. No murmurs, rubs, or gallops appreciated. Abdomen: Soft, non-tender, non-distended with normoactive bowel sounds. No hepatomegaly. No rebound/guarding. No obvious abdominal masses. Msk:  Strength and tone appear normal for age. Extremities: No clubbing or cyanosis. 1+ bilateral lower extremity edema. Distal pedal pulses are 2+ and equal bilaterally. Neuro: Alert and oriented X 3. No facial asymmetry. No focal deficit. Moves all extremities spontaneously. Psych:  Responds to questions appropriately with a normal affect.   EKG:  The EKG was personally reviewed and demonstrates: Atrial flutter with variable AV block, 95 bpm, right axis deviation, aberrancy, baseline wandering, poor R wave progression along the precordial leads, nonspecific ST-T changes Telemetry:  Telemetry was personally reviewed and demonstrates: Atrial flutter with variable AV block with ventricular rates in the 80s to 90s bpm with rare aberrancy   Weights: Filed Weights   04/02/22 0500 04/03/22 0500  Weight: 117.8 kg 117.8 kg    Relevant CV Studies:  2D echo 04/01/2022: 1. Left ventricular ejection fraction, by estimation, is 60 to 65%. The  left ventricle has normal  function. Left ventricular endocardial border  not optimally defined to evaluate regional wall motion. There is severe  left ventricular hypertrophy. Left  ventricular diastolic parameters are indeterminate.   2. Right ventricular systolic function is mildly reduced. The right  ventricular size is moderately enlarged. Severely increased right  ventricular wall thickness. There is severely elevated pulmonary artery  systolic pressure. The estimated right  ventricular systolic pressure is 49.6 mmHg.   3. Left atrial size was upper normal.   4. Right atrial size was mildly dilated.   5. The mitral valve is abnormal. Trivial mitral valve regurgitation.   6. Tricuspid valve regurgitation is mild to moderate.   7. The aortic valve is tricuspid. There is moderate calcification of the  aortic valve. There is moderate thickening of the aortic valve. Aortic  valve regurgitation is trivial. Aortic valve sclerosis/calcification is  present, without any evidence of  aortic stenosis.   8. Aortic dilatation noted. There is borderline dilatation of the aortic  root, measuring 38 mm.   9. The inferior vena cava is dilated in size with <50% respiratory  variability, suggesting right atrial pressure of 15 mmHg. __________  2D echo 08/12/2021: 1. Left ventricular ejection fraction, by estimation, is 55 to 60%. The  left ventricle has normal function. The  left ventricle has no regional  wall motion abnormalities. Left ventricular diastolic parameters are  indeterminate.   2. Right ventricular systolic function is normal. The right ventricular  size is normal. Tricuspid regurgitation signal is inadequate for assessing  PA pressure.   3. Left atrial size was mildly dilated.   4. Right atrial size was mildly dilated.   5. The mitral valve is normal in structure. No evidence of mitral valve  regurgitation. No evidence of mitral stenosis.   6. The aortic valve is calcified. Aortic valve regurgitation is not   visualized. Aortic valve sclerosis/calcification is present, without any  evidence of aortic stenosis. __________  2D echo 12/08/2019: 1. Left ventricular ejection fraction, by estimation, is 55 to 60%. The  left ventricle has normal function. The left ventricle has no regional  wall motion abnormalities. There is mild left ventricular hypertrophy.  Left ventricular diastolic parameters  are indeterminate.   2. Right ventricular systolic function is normal. The right ventricular  size is normal. Tricuspid regurgitation signal is inadequate for assessing  PA pressure.   3. Left atrial size was moderately dilated.   4. Challenging image quality  __________  Elwyn Reach patch 07/2019: Normal sinus rhythm avg HR of 77 bpm   4 Ventricular Tachycardia runs occurred, the run with the fastest interval lasting 7 beats with a max rate of 190 bpm,  the longest lasting 6 beats with an avg rate of 146 bpm.    189 Supraventricular Tachycardia runs occurred, the run with the fastest interval lasting 6 beats with a max rate of 174 bpm, the longest lasting 15 beats with an avg rate of 107 bpm.   Idioventricular Rhythm was present.   Isolated SVEs were occasional (4.9%, 75441),SVE Couplets were rare (<1.0%, 5401), and SVE Triplets were rare (<1.0%, 862). Isolated VEs were rare (<1.0%, 14128), VE Couplets were rare (<1.0%, 430), and VE Triplets were rare (<1.0%, 3). Ventricular Bigeminy and Trigeminy were present.   No patient triggered events noted. ___________  [facility] 09/08/2019: Coronary dominance: Right or codominant  Left mainstem:   Large vessel that bifurcates into the LAD and left circumflex, mild to moderate ostial left main disease 40%, 70% distal left main disease  Left anterior descending (LAD):   Large vessel that extends to the apical region, diagonal branch 2 of moderate size,  long region of  stenosis estimated 70%  Left circumflex (LCx):  Large vessel with OM branch 2, mid left  circumflex estimated at 80%  Right coronary artery (RCA):  Right dominant vessel with PL and PDA, 80 to 90% proximal stenosis, occluded in the mid vessels with collaterals from left to right  Left ventriculography: Left ventricular systolic function was not measured given renal dysfunction creatinine 1.8, crossed for pressures  No significant aortic valve stenosis   Right heart pressures RA 4 RV 61/7 PA 67/23 mean 42 Wedge pressure 43 Left ventricular end-diastolic pressure 10  Cardiac index 2.13 cardiac output 4.79  Final Conclusions:   Severe three-vessel native coronary disease including left main Elevated right heart pressures consistent with moderate to severe pulmonary hypertension\ Curiously wedge is markedly elevated with appropriate left ventricular end-diastolic pressure, etiology of disconnect is unclear.  No significant MR. Recent CT scan with no significant abnormality noted, normal ejection fraction on echo.  He has been on Lasix, overdiuresis contributing to worsening renal dysfunction -Severe underlying COPD from long history of smoking   Recommendations:  Case discussed with interventional cardiology, Concerned about distal left main disease Also  mid circumflex, LAD Will refer to CT surgery, also advanced heart failure clinic to assist with right heart pressures --He does not want to stay in the hospital and be transferred to [facility] , with optimization of his condition Prefers to do this as an outpatient __________  2D echo 08/25/2019:  1. Left ventricular ejection fraction, by visual estimation, is 60 to  65%. The left ventricle has normal function. There is moderately increased  left ventricular hypertrophy.   2. Left ventricular diastolic parameters are consistent with Grade I  diastolic dysfunction (impaired relaxation).   3. The left ventricle has no regional wall motion abnormalities.   4. Global right ventricle has normal systolic function.The right   ventricular size is normal. No increase in right ventricular wall  thickness.   5. Left atrial size was mildly dilated.   6. TR signal is inadequate for assessing pulmonary artery systolic  pressure.  __________  Treadmill MPI 06/03/2017: Exercise myocardial perfusion imaging study predominantly fixed inferior wall perfusion defect consistent with old MI, with mild peri-infarct ischemia.  Inferior wall hypokinesis, EF estimated at 38% No EKG changes concerning for ischemia at peak stress or in recovery. T wave abnormality anterolateral leads at rest Hypertensive with exercise Adequate exercise tolerance, 5 min 46 sec, 7 METS Target heart rate achieved Moderate risk scan given old MI, depressed EF Images consistent with known RCA occlusion  Laboratory Data:  Chemistry Recent Labs  Lab 04/01/22 1052 04/02/22 0418 04/03/22 0511  NA 138 140 138  K 5.2* 4.3 4.4  CL 97* 99 94*  CO2 31 31 33*  GLUCOSE 165* 126* 143*  BUN 69* 63* 62*  CREATININE 3.94* 3.62* 3.51*  CALCIUM 9.6 9.4 9.6  GFRNONAA 15* 17* 18*  ANIONGAP '10 10 11    '$ Recent Labs  Lab 03/28/22 1349  ALBUMIN 3.8   Hematology Recent Labs  Lab 04/01/22 1052 04/02/22 0418  WBC 7.1 6.6  RBC 5.14 5.12  HGB 11.5* 11.4*  HCT 39.9 39.7  MCV 77.6* 77.5*  MCH 22.4* 22.3*  MCHC 28.8* 28.7*  RDW 20.0* 20.0*  PLT 242 234   Cardiac EnzymesNo results for input(s): "TROPONINI" in the last 168 hours. No results for input(s): "TROPIPOC" in the last 168 hours.  BNP Recent Labs  Lab 04/01/22 1052  BNP 267.2*    DDimer No results for input(s): "DDIMER" in the last 168 hours.  Radiology/Studies:   DG Chest 2 View  Result Date: 04/01/2022 IMPRESSION: No active cardiopulmonary disease. Electronically Signed   By: Zetta Bills M.D.   On: 04/01/2022 11:24    Assessment and Plan:   1. New onset atrial flutter with variable AV block with history of postoperative Afib: -Uncertain onset, possibly > 2 weeks prior given  onset of symptoms, noted to be in sinus rhythm in 11/2021 and 12/2021 EKGs -Contributing to HFpEF/volume overload -Ventricular rates well controlled -Continue rate control with Lopressor 25 mg twice daily -CHA2DS2-VASc at least 5 (CHF, HTN, age x1, DM, vascular disease) -Start apixaban 5 mg twice daily (he does not meet reduced dosing criteria) -Plan for outpatient DCCV after he has been adequately anticoagulated for a minimum of 4 weeks without interruption if he does not spontaneously convert -Echo this admission as outlined above -TSH normal  2. Acute on chronic hypoxic respiratory failure/pulmonary hypertension: -Multifactorial including acute on chronic HFpEF, underlying CAD, new onset atrial flutter, COPD with ongoing tobacco use, and renal dysfunction -Wean supplemental oxygen as able -Should his dyspnea persist, may benefit from  RHC to further understand his hemodynamics  3. Acute on chronic HFpEF: -Volume improving with Lasix gtt, though remains volume up -Management per nephrology  -Likely exacerbated by atrial flutter -Not currently on spironolactone or SGLT2 inhibitor secondary to acute on CKD stage IV -Echo as above  4. CAD s/p CABG with elevated high sensitivity troponin: -No chest pain -Mildly elevated and non-trending high sensitivity troponin of 22 -Not consistent with ACS -Start apixaban in place of aspirin to minimize risk of bleeding  5. Acute on CKD stage IV: -Improving -Management per nephrology  6. Hyperkalemia: -Resolved  7. HTN: -Blood pressure well controlled -Continue current therapy  8. HLD: -LDL63 -PTA Lipitor  9. Tobacco use: -Cessation recommended     For questions or updates, please contact [place] Please consult [email or web address] for contact info under Cardiology/STEMI.   Signed, Christell Faith, PA-C [place] Pager: [phone] 04/03/2022, 11:16 AM

## 2022-04-03 NOTE — Progress Notes (Signed)
Nicotine patch removed per order. Skin cleaned.

## 2022-04-03 NOTE — Progress Notes (Signed)
[facility]  ROUNDING NOTE   Subjective:   Joshua Roy is a 71 year old male with past medical conditions including hyperlipidemia, hypertension, COPD, diabetes, and chronic kidney disease.  Patient presents to the emergency department with shortness of breath and has been admitted for COPD exacerbation ([place]) [J44.1] Acute respiratory failure with hypoxia ([place]) [J96.01] Congestive heart failure, unspecified HF chronicity, unspecified heart failure type [facility]) [I50.9]  Patient is known to our practice and is followed by Dr. Zollie Scale outpatient.    Patient seen laying in bed Feet elevated States he continues to feel better, denies shortness of breath Lower extremity edema improved.  Remains on 4L, baseline nightly only with 2-2.5L  Objective:  Vital signs in last 24 hours:  Temp:  [97.8 F (36.6 C)-98.3 F (36.8 C)] 98.1 F (36.7 C) (09/07 1152) Pulse Rate:  [78-99] 84 (09/07 1152) Resp:  [16-20] 19 (09/07 1152) BP: (110-135)/(56-77) 132/63 (09/07 1152) SpO2:  [90 %-96 %] 94 % (09/07 1152) Weight:  [117.8 kg] 117.8 kg (09/07 0500)  Weight change: -0.045 kg Filed Weights   04/02/22 0500 04/03/22 0500  Weight: 117.8 kg 117.8 kg    Intake/Output: I/O last 3 completed shifts: In: 1150.7 [P.O.:1040; I.V.:110.7] Out: 3665 [Urine:3665]   Intake/Output this shift:  Total I/O In: -  Out: 830 [Urine:830]  Physical Exam: General: NAD  Head: Normocephalic, atraumatic. Moist oral mucosal membranes  Eyes: Anicteric  Lungs:  Diminished bases, normal effort,  O2  Heart: Regular rate and rhythm  Abdomen:  Soft, nontender,   Extremities: 3+ peripheral edema.  Neurologic: Nonfocal, moving all four extremities  Skin: No lesions  Access: None    Basic Metabolic Panel: Recent Labs  Lab 03/28/22 1349 04/01/22 1052 04/02/22 0418 04/03/22 0511  NA 136 138 140 138  K 6.0* 5.2* 4.3 4.4  CL 97* 97* 99 94*  CO2 '29 31 31 '$ 33*  GLUCOSE 67* 165* 126* 143*  BUN  82* 69* 63* 62*  CREATININE 5.45* 3.94* 3.62* 3.51*  CALCIUM 8.8* 9.6 9.4 9.6  PHOS 5.6*  --   --   --      Liver Function Tests: Recent Labs  Lab 03/28/22 1349  ALBUMIN 3.8    No results for input(s): "LIPASE", "AMYLASE" in the last 168 hours. No results for input(s): "AMMONIA" in the last 168 hours.  CBC: Recent Labs  Lab 04/01/22 1052 04/02/22 0418  WBC 7.1 6.6  HGB 11.5* 11.4*  HCT 39.9 39.7  MCV 77.6* 77.5*  PLT 242 234     Cardiac Enzymes: No results for input(s): "CKTOTAL", "CKMB", "CKMBINDEX", "TROPONINI" in the last 168 hours.  BNP: Invalid input(s): "POCBNP"  CBG: Recent Labs  Lab 04/02/22 1637 04/02/22 2031 04/02/22 2119 04/03/22 0815 04/03/22 1144  GLUCAP 207* 189* 170* 150* 220*     Microbiology: Results for orders placed or performed during the hospital encounter of 04/01/22  Resp Panel by RT-PCR (Flu A&B, Covid) Anterior Nasal Swab     Status: None   Collection Time: 04/01/22 11:25 AM   Specimen: Anterior Nasal Swab  Result Value Ref Range Status   SARS Coronavirus 2 by RT PCR NEGATIVE NEGATIVE Final    Comment: (NOTE) SARS-CoV-2 target nucleic acids are NOT DETECTED.  The SARS-CoV-2 RNA is generally detectable in upper respiratory specimens during the acute phase of infection. The lowest concentration of SARS-CoV-2 viral copies this assay can detect is 138 copies/mL. A negative result does not preclude SARS-Cov-2 infection and should not be used as the  sole basis for treatment or other patient management decisions. A negative result may occur with  improper specimen collection/handling, submission of specimen other than nasopharyngeal swab, presence of viral mutation(s) within the areas targeted by this assay, and inadequate number of viral copies(<138 copies/mL). A negative result must be combined with clinical observations, patient history, and epidemiological information. The expected result is Negative.  Fact Sheet for  Patients:  EntrepreneurPulse.com.au  Fact Sheet for Healthcare Providers:  IncredibleEmployment.be  This test is no t yet approved or cleared by the [place] FDA and  has been authorized for detection and/or diagnosis of SARS-CoV-2 by FDA under an Emergency Use Authorization (EUA). This EUA will remain  in effect (meaning this test can be used) for the duration of the COVID-19 declaration under Section 564(b)(1) of the Act, 21 U.S.C.section 360bbb-3(b)(1), unless the authorization is terminated  or revoked sooner.       Influenza A by PCR NEGATIVE NEGATIVE Final   Influenza B by PCR NEGATIVE NEGATIVE Final    Comment: (NOTE) The Xpert Xpress SARS-CoV-2/FLU/RSV plus assay is intended as an aid in the diagnosis of influenza from Nasopharyngeal swab specimens and should not be used as a sole basis for treatment. Nasal washings and aspirates are unacceptable for Xpert Xpress SARS-CoV-2/FLU/RSV testing.  Fact Sheet for Patients: EntrepreneurPulse.com.au  Fact Sheet for Healthcare Providers: IncredibleEmployment.be  This test is not yet approved or cleared by the [place] FDA and has been authorized for detection and/or diagnosis of SARS-CoV-2 by FDA under an Emergency Use Authorization (EUA). This EUA will remain in effect (meaning this test can be used) for the duration of the COVID-19 declaration under Section 564(b)(1) of the Act, 21 U.S.C. section 360bbb-3(b)(1), unless the authorization is terminated or revoked.  Performed at [facility], [address] [place], [place] [address]     Coagulation Studies: No results for input(s): "LABPROT", "INR" in the last 72 hours.  Urinalysis: No results for input(s): "COLORURINE", "LABSPEC", "PHURINE", "GLUCOSEU", "HGBUR", "BILIRUBINUR", "KETONESUR", "PROTEINUR", "UROBILINOGEN", "NITRITE", "LEUKOCYTESUR" in the last 72 hours.  Invalid  input(s): "APPERANCEUR"    Imaging: ECHOCARDIOGRAM COMPLETE  Result Date: 04/01/2022    ECHOCARDIOGRAM REPORT   Patient Name:   Joshua Roy Date of Exam: 04/01/2022 Medical Rec #:  [id number]            Height:       70.0 in Accession #:    [id number]           Weight:       266.0 lb Date of Birth:  Mar 17, 1951           BSA:          2.356 m Patient Age:    37 years             BP:           103/77 mmHg Patient Gender: M                    HR:           83 bpm. Exam Location:  [facility] Procedure: 2D Echo, Cardiac Doppler and Color Doppler Indications:     Elevated Troponin  History:         Patient has prior history of Echocardiogram examinations, most                  recent 08/12/2021. CHF, CAD, Prior CABG and Carotid  endarterectomy, COPD, Arrythmias:Atrial Fibrillation; Risk                  Factors:Current Smoker, Diabetes, Hypertension, Dyslipidemia                  and Sleep Apnea.  Sonographer:     Rosalia Hammers Referring Phys:  [id number] RONDELL A SMITH Diagnosing Phys: Nelva Bush MD  Sonographer Comments: Suboptimal apical window. Image acquisition challenging due to patient body habitus, Image acquisition challenging due to COPD and Image acquisition challenging due to respiratory motion. IMPRESSIONS  1. Left ventricular ejection fraction, by estimation, is 60 to 65%. The left ventricle has normal function. Left ventricular endocardial border not optimally defined to evaluate regional wall motion. There is severe left ventricular hypertrophy. Left ventricular diastolic parameters are indeterminate.  2. Right ventricular systolic function is mildly reduced. The right ventricular size is moderately enlarged. Severely increased right ventricular wall thickness. There is severely elevated pulmonary artery systolic pressure. The estimated right ventricular systolic pressure is 07.3 mmHg.  3. Left atrial size was upper normal.  4. Right atrial size was mildly dilated.  5. The mitral  valve is abnormal. Trivial mitral valve regurgitation.  6. Tricuspid valve regurgitation is mild to moderate.  7. The aortic valve is tricuspid. There is moderate calcification of the aortic valve. There is moderate thickening of the aortic valve. Aortic valve regurgitation is trivial. Aortic valve sclerosis/calcification is present, without any evidence of aortic stenosis.  8. Aortic dilatation noted. There is borderline dilatation of the aortic root, measuring 38 mm.  9. The inferior vena cava is dilated in size with <50% respiratory variability, suggesting right atrial pressure of 15 mmHg. FINDINGS  Left Ventricle: Left ventricular ejection fraction, by estimation, is 60 to 65%. The left ventricle has normal function. Left ventricular endocardial border not optimally defined to evaluate regional wall motion. The left ventricular internal cavity size was normal in size. There is severe left ventricular hypertrophy. Left ventricular diastolic parameters are indeterminate. Right Ventricle: The right ventricular size is moderately enlarged. Severely increased right ventricular wall thickness. Right ventricular systolic function is mildly reduced. There is severely elevated pulmonary artery systolic pressure. The tricuspid regurgitant velocity is 3.98 m/s, and with an assumed right atrial pressure of 15 mmHg, the estimated right ventricular systolic pressure is 71.0 mmHg. Left Atrium: Left atrial size was upper normal. Right Atrium: Right atrial size was mildly dilated. Pericardium: There is no evidence of pericardial effusion. Mitral Valve: The mitral valve is abnormal. There is mild thickening of the mitral valve leaflet(s). Trivial mitral valve regurgitation. Tricuspid Valve: The tricuspid valve is normal in structure. Tricuspid valve regurgitation is mild to moderate. Aortic Valve: The aortic valve is tricuspid. There is moderate calcification of the aortic valve. There is moderate thickening of the aortic valve.  Aortic valve regurgitation is trivial. Aortic valve sclerosis/calcification is present, without any evidence of aortic stenosis. Aortic valve mean gradient measures 6.0 mmHg. Aortic valve peak gradient measures 8.4 mmHg. Aortic valve area, by VTI measures 2.11 cm. Pulmonic Valve: The pulmonic valve was normal in structure. Pulmonic valve regurgitation is trivial. No evidence of pulmonic stenosis. Aorta: Aortic dilatation noted. There is borderline dilatation of the aortic root, measuring 38 mm. Pulmonary Artery: The pulmonary artery is of normal size. Venous: The inferior vena cava is dilated in size with less than 50% respiratory variability, suggesting right atrial pressure of 15 mmHg. IAS/Shunts: The interatrial septum was not well visualized.  LEFT VENTRICLE PLAX 2D  LVIDd:         4.91 cm LVIDs:         3.03 cm LV PW:         1.65 cm LV IVS:        1.70 cm LVOT diam:     2.00 cm LV SV:         62 LV SV Index:   26 LVOT Area:     3.14 cm  RIGHT VENTRICLE RV Basal diam:  5.25 cm TAPSE (M-mode): 1.4 cm LEFT ATRIUM             Index        RIGHT ATRIUM           Index LA diam:        4.00 cm 1.70 cm/m   RA Area:     22.60 cm LA Vol (A2C):   71.3 ml 30.26 ml/m  RA Volume:   61.20 ml  25.97 ml/m LA Vol (A4C):   63.2 ml 26.82 ml/m LA Biplane Vol: 71.3 ml 30.26 ml/m  AORTIC VALVE                     PULMONIC VALVE AV Area (Vmax):    2.04 cm      PR End Diast Vel: 9.73 msec AV Area (Vmean):   1.99 cm AV Area (VTI):     2.11 cm AV Vmax:           145.00 cm/s AV Vmean:          115.000 cm/s AV VTI:            0.292 m AV Peak Grad:      8.4 mmHg AV Mean Grad:      6.0 mmHg LVOT Vmax:         94.20 cm/s LVOT Vmean:        73.000 cm/s LVOT VTI:          0.196 m LVOT/AV VTI ratio: 0.67  AORTA Ao Root diam: 3.80 cm MITRAL VALVE                TRICUSPID VALVE MV Area (PHT): 4.60 cm     TR Peak grad:   63.4 mmHg MV Decel Time: 165 msec     TR Vmax:        398.00 cm/s MV E velocity: 123.00 cm/s                              SHUNTS                             Systemic VTI:  0.20 m                             Systemic Diam: 2.00 cm Nelva Bush MD Electronically signed by Nelva Bush MD Signature Date/Time: 04/01/2022/5:08:58 PM    Final      Medications:    furosemide (LASIX) 200 mg in dextrose 5 % 100 mL (2 mg/mL) infusion 6 mg/hr (04/03/22 0337)    apixaban  5 mg Oral BID   atorvastatin  80 mg Oral QPM   insulin aspart  0-6 Units Subcutaneous TID WC   metoprolol tartrate  25 mg Oral BID   nicotine  21 mg Transdermal Daily   pantoprazole  40  mg Oral QAC breakfast   sodium chloride flush  3 mL Intravenous Q12H   traZODone  50 mg Oral QHS   acetaminophen **OR** acetaminophen, albuterol, ALPRAZolam, ondansetron **OR** ondansetron (ZOFRAN) IV  Assessment/ Plan:  Mr. Joshua Roy is a 71 y.o.  male with past medical conditions including hyperlipidemia, hypertension, COPD, diabetes, and chronic kidney disease.  Patient presents to the emergency department with shortness of breath and has been admitted for COPD exacerbation ([place]) [J44.1] Acute respiratory failure with hypoxia ([place]) [J96.01] Congestive heart failure, unspecified HF chronicity, unspecified heart failure type ([place]) [I50.9]    Acute Kidney Injury on chronic kidney disease stage IV with baseline creatinine 2.09 and GFR of 33 on 12/24/21.  Acute kidney injury secondary to aggressive outpatient diuresis and increase fluid volume.  No IV contrast exposure.    Continue furosemide drip for now, UOP 2L. Edema improving. Will continue current treatment overnight and transition to oral agents tomorrow.   Lab Results  Component Value Date   CREATININE 3.51 (H) 04/03/2022   CREATININE 3.62 (H) 04/02/2022   CREATININE 3.94 (H) 04/01/2022    Intake/Output Summary (Last 24 hours) at 04/03/2022 1324 Last data filed at 04/03/2022 1200 Gross per 24 hour  Intake 424.98 ml  Output 2915 ml  Net -2490.02 ml    2.  Hypertension with chronic kidney  disease.  Home regimen includes furosemide, losartan and metoprolol.  Currently receiving metoprolol with furosemide drip.  3. Anemia of chronic kidney disease Lab Results  Component Value Date   HGB 11.4 (L) 04/02/2022    Hemoglobin stable     LOS: 2 Tajah Schreiner 9/7/20231:24 PM

## 2022-04-03 NOTE — Progress Notes (Addendum)
PROGRESS NOTE    Joshua Roy  LFY:[id number] DOB: [birth year] DOA: 04/01/2022 PCP: [name], MD   Brief Narrative: Per Dr. Kendrick Ranch Warning is a 71 y.o. male with medical history significant of HTN, HLD, HFpEF, PAF, PSVT CAD, COPD, CKD stage III/IV, diabetes mellitus type 2, anxiety, and tobacco abuse presents with complaints of progressively worsening shortness of breath.  Patient admits that his shortness of breath worsens with any kind of activity.  Over the last 2 weeks he has gained approximately 20 pounds.  Reports associated symptoms of bilateral lower extremity swelling and orthopnea.  For at least the last week he had increased his Lasix from 40 mg twice daily to 80 mg twice daily without significant change in his symptoms.  He also had notified his nephrologist Dr. Holley Raring in regards to his weight gain and had been set up to received outpatient IV Lasix infusions.  His last infusion was 4 days ago, but over the holiday weekend and his symptoms worsen and he was advised to come to the hospital for further evaluation.  During this time he has not had any significant fever, cough, nausea, vomiting, diarrhea, or abdominal pain symptoms.  Patient reports that he still smokes about a pack cigarettes per day on average. Need of dialysis had not been brought up.   Upon admission to the emergency department patient was noted to be afebrile, tachypneic, and O2 saturations as low as 78% with improvement to greater than 92% on 3 L of nasal cannula oxygen.  Labs were significant for potassium 5.2, BUN 69, creatinine 3.94, BNP 267.2, and high-sensitivity troponin 22.  Chest x-ray showed stable appearance without active disease.  Patient was given Lasix 80 mg IV. Assessment & Plan:   Principal Problem:   Acute respiratory failure with hypoxia (HCC) Active Problems:   (HFpEF) heart failure with preserved ejection fraction (HCC)   Hyperkalemia   Elevated troponin   Abnormal EKG    Chronic kidney disease   Iron deficiency anemia   CAD (coronary artery disease)   COPD (chronic obstructive pulmonary disease) (HCC)   T2DM (type 2 diabetes mellitus) ([place])   Hyperlipidemia   Tobacco abuse   Essential hypertension   Anxiety    #1 acute hypoxic respiratory failure due to heart failure with diastolic dysfunction complicated by CKD stage IV. His oxygen saturation on admission was 78% on room air. He is admitted with 20 pound weight gain and bilateral lower extremity swelling PND and orthopnea. Patient has been on Lasix drip followed by nephrology. Influenza and COVID-negative. Seen by cardiology started Eliquis and stopped Plavix for new onset atrial flutter Plan for Eliquis for 4 weeks and then cardioversion as an outpatient He is negative by 2490  No significant improvement in spite of being on Lasix drip. echocardiogram 04/01/2022-ejection fraction 60 to 65%.  Severe LVH.  Right ventricular systolic function is mildly reduced.  Severely elevated pulmonary artery systolic pressure.  New aflutter -seen by cardiology.  Starting Eliquis stopping Plavix possible cardioversion in 4 weeks.  #2 CKD stage IV-creatinine 3.51 from 3.62 from 3.94 on Lasix drip.  He states he will continues with fluid overload with bilateral lower extremity edema PND and orthopnea.   Nephrology to discuss regarding plans for dialysis in the near future.  #3 hyperlipidemia on statin Fenofibrate stopped due to CKD   #4 tobacco abuse -counseled regarding cessation  #5 history of essential hypertension on metoprolol, losartan on hold  #6 hyperkalemia resolved losartan on hold  #  7 type 2 diabetes on metformin and Amaryl prior to admission.  Metformin stopped due to elevated creatinine.  Amaryl on hold.  Continue SSI. CBG (last 3)  Recent Labs    04/02/22 2119 04/03/22 0815 04/03/22 1144  GLUCAP 170* 150* 220*   #8 iron deficiency anemia we will transfuse a unit of for him  today.  Estimated body mass index is 37.25 kg/m as calculated from the following:   Height as of 03/18/22: '5\' 10"'$  (1.778 m).   Weight as of this encounter: 117.8 kg.  DVT prophylaxis: Heparin  code Status: Full code  family Communication: None at bedside  disposition Plan:  Status is: Inpatient Remains inpatient appropriate because: Fluid overload on IV Lasix drip   Consultants:  Nephrology Cardiology  Procedures: None Antimicrobials: None  Subjective: Patient sitting up in recliner with legs hanging down and sleeping.  He slept in his recliner all night.  He was due to get iron infusion today at the outpatient cancer center for iron deficiency anemia.  He continues to have 3+ pitting lower extremity edema.  He remains on 4 L of oxygen.  Objective: Vitals:   04/03/22 0500 04/03/22 0814 04/03/22 1000 04/03/22 1152  BP:  135/77 124/68 132/63  Pulse:  99 86 84  Resp:  '18 20 19  '$ Temp:  97.8 F (36.6 C) 97.8 F (36.6 C) 98.1 F (36.7 C)  TempSrc:   Oral   SpO2:  96% 94% 94%  Weight: 117.8 kg       Intake/Output Summary (Last 24 hours) at 04/03/2022 1221 Last data filed at 04/03/2022 1200 Gross per 24 hour  Intake 424.98 ml  Output 2915 ml  Net -2490.02 ml    Filed Weights   04/02/22 0500 04/03/22 0500  Weight: 117.8 kg 117.8 kg    Examination:  General exam: Appears not in acute distress Respiratory system: Crackles to auscultation. Respiratory effort normal. Cardiovascular system: S1 & S2 heard, RRR. No JVD, murmurs, rubs, gallops or clicks. No pedal edema. Gastrointestinal system: Abdomen is nondistended, soft and nontender. No organomegaly or masses felt. Normal bowel sounds heard. Central nervous system: Alert and oriented. No focal neurological deficits. Extremities: 2+ pitting edema    Data Reviewed: I have personally reviewed following labs and imaging studies  CBC: Recent Labs  Lab 04/01/22 1052 04/02/22 0418  WBC 7.1 6.6  HGB 11.5* 11.4*  HCT 39.9  39.7  MCV 77.6* 77.5*  PLT 242 664    Basic Metabolic Panel: Recent Labs  Lab 03/28/22 1349 04/01/22 1052 04/02/22 0418 04/03/22 0511  NA 136 138 140 138  K 6.0* 5.2* 4.3 4.4  CL 97* 97* 99 94*  CO2 '29 31 31 '$ 33*  GLUCOSE 67* 165* 126* 143*  BUN 82* 69* 63* 62*  CREATININE 5.45* 3.94* 3.62* 3.51*  CALCIUM 8.8* 9.6 9.4 9.6  PHOS 5.6*  --   --   --     GFR: Estimated Creatinine Clearance: 24.5 mL/min (A) (by C-G formula based on SCr of 3.51 mg/dL (H)). Liver Function Tests: Recent Labs  Lab 03/28/22 1349  ALBUMIN 3.8    No results for input(s): "LIPASE", "AMYLASE" in the last 168 hours. No results for input(s): "AMMONIA" in the last 168 hours. Coagulation Profile: No results for input(s): "INR", "PROTIME" in the last 168 hours. Cardiac Enzymes: No results for input(s): "CKTOTAL", "CKMB", "CKMBINDEX", "TROPONINI" in the last 168 hours. BNP (last 3 results) No results for input(s): "PROBNP" in the last 8760 hours. HbA1C: No results  for input(s): "HGBA1C" in the last 72 hours. CBG: Recent Labs  Lab 04/02/22 1637 04/02/22 2031 04/02/22 2119 04/03/22 0815 04/03/22 1144  GLUCAP 207* 189* 170* 150* 220*    Lipid Profile: No results for input(s): "CHOL", "HDL", "LDLCALC", "TRIG", "CHOLHDL", "LDLDIRECT" in the last 72 hours. Thyroid Function Tests: Recent Labs    04/01/22 1406  TSH 2.088    Anemia Panel: No results for input(s): "VITAMINB12", "FOLATE", "FERRITIN", "TIBC", "IRON", "RETICCTPCT" in the last 72 hours. Sepsis Labs: Recent Labs  Lab 04/01/22 1052  PROCALCITON <0.10     Recent Results (from the past 240 hour(s))  Resp Panel by RT-PCR (Flu A&B, Covid) Anterior Nasal Swab     Status: None   Collection Time: 04/01/22 11:25 AM   Specimen: Anterior Nasal Swab  Result Value Ref Range Status   SARS Coronavirus 2 by RT PCR NEGATIVE NEGATIVE Final    Comment: (NOTE) SARS-CoV-2 target nucleic acids are NOT DETECTED.  The SARS-CoV-2 RNA is generally  detectable in upper respiratory specimens during the acute phase of infection. The lowest concentration of SARS-CoV-2 viral copies this assay can detect is 138 copies/mL. A negative result does not preclude SARS-Cov-2 infection and should not be used as the sole basis for treatment or other patient management decisions. A negative result may occur with  improper specimen collection/handling, submission of specimen other than nasopharyngeal swab, presence of viral mutation(s) within the areas targeted by this assay, and inadequate number of viral copies(<138 copies/mL). A negative result must be combined with clinical observations, patient history, and epidemiological information. The expected result is Negative.  Fact Sheet for Patients:  EntrepreneurPulse.com.au  Fact Sheet for Healthcare Providers:  IncredibleEmployment.be  This test is no t yet approved or cleared by the [place] FDA and  has been authorized for detection and/or diagnosis of SARS-CoV-2 by FDA under an Emergency Use Authorization (EUA). This EUA will remain  in effect (meaning this test can be used) for the duration of the COVID-19 declaration under Section 564(b)(1) of the Act, 21 U.S.C.section 360bbb-3(b)(1), unless the authorization is terminated  or revoked sooner.       Influenza A by PCR NEGATIVE NEGATIVE Final   Influenza B by PCR NEGATIVE NEGATIVE Final    Comment: (NOTE) The Xpert Xpress SARS-CoV-2/FLU/RSV plus assay is intended as an aid in the diagnosis of influenza from Nasopharyngeal swab specimens and should not be used as a sole basis for treatment. Nasal washings and aspirates are unacceptable for Xpert Xpress SARS-CoV-2/FLU/RSV testing.  Fact Sheet for Patients: EntrepreneurPulse.com.au  Fact Sheet for Healthcare Providers: IncredibleEmployment.be  This test is not yet approved or cleared by the [place] FDA  and has been authorized for detection and/or diagnosis of SARS-CoV-2 by FDA under an Emergency Use Authorization (EUA). This EUA will remain in effect (meaning this test can be used) for the duration of the COVID-19 declaration under Section 564(b)(1) of the Act, 21 U.S.C. section 360bbb-3(b)(1), unless the authorization is terminated or revoked.  Performed at [facility], [address], [place], [place] [address]          Radiology Studies: ECHOCARDIOGRAM COMPLETE  Result Date: 04/01/2022    ECHOCARDIOGRAM REPORT   Patient Name:   Joshua Roy Date of Exam: 04/01/2022 Medical Rec #:  [id number]            Height:       70.0 in Accession #:    [id number]  Weight:       266.0 lb Date of Birth:  04/24/1951           BSA:          2.356 m Patient Age:    79 years             BP:           103/77 mmHg Patient Gender: M                    HR:           83 bpm. Exam Location:  [facility] Procedure: 2D Echo, Cardiac Doppler and Color Doppler Indications:     Elevated Troponin  History:         Patient has prior history of Echocardiogram examinations, most                  recent 08/12/2021. CHF, CAD, Prior CABG and Carotid                  endarterectomy, COPD, Arrythmias:Atrial Fibrillation; Risk                  Factors:Current Smoker, Diabetes, Hypertension, Dyslipidemia                  and Sleep Apnea.  Sonographer:     Rosalia Hammers Referring Phys:  [id number] RONDELL A SMITH Diagnosing Phys: Nelva Bush MD  Sonographer Comments: Suboptimal apical window. Image acquisition challenging due to patient body habitus, Image acquisition challenging due to COPD and Image acquisition challenging due to respiratory motion. IMPRESSIONS  1. Left ventricular ejection fraction, by estimation, is 60 to 65%. The left ventricle has normal function. Left ventricular endocardial border not optimally defined to evaluate regional wall motion. There is severe left ventricular hypertrophy. Left  ventricular diastolic parameters are indeterminate.  2. Right ventricular systolic function is mildly reduced. The right ventricular size is moderately enlarged. Severely increased right ventricular wall thickness. There is severely elevated pulmonary artery systolic pressure. The estimated right ventricular systolic pressure is 95.0 mmHg.  3. Left atrial size was upper normal.  4. Right atrial size was mildly dilated.  5. The mitral valve is abnormal. Trivial mitral valve regurgitation.  6. Tricuspid valve regurgitation is mild to moderate.  7. The aortic valve is tricuspid. There is moderate calcification of the aortic valve. There is moderate thickening of the aortic valve. Aortic valve regurgitation is trivial. Aortic valve sclerosis/calcification is present, without any evidence of aortic stenosis.  8. Aortic dilatation noted. There is borderline dilatation of the aortic root, measuring 38 mm.  9. The inferior vena cava is dilated in size with <50% respiratory variability, suggesting right atrial pressure of 15 mmHg. FINDINGS  Left Ventricle: Left ventricular ejection fraction, by estimation, is 60 to 65%. The left ventricle has normal function. Left ventricular endocardial border not optimally defined to evaluate regional wall motion. The left ventricular internal cavity size was normal in size. There is severe left ventricular hypertrophy. Left ventricular diastolic parameters are indeterminate. Right Ventricle: The right ventricular size is moderately enlarged. Severely increased right ventricular wall thickness. Right ventricular systolic function is mildly reduced. There is severely elevated pulmonary artery systolic pressure. The tricuspid regurgitant velocity is 3.98 m/s, and with an assumed right atrial pressure of 15 mmHg, the estimated right ventricular systolic pressure is 93.2 mmHg. Left Atrium: Left atrial size was upper normal. Right Atrium: Right atrial size was mildly dilated. Pericardium: There  is no evidence of pericardial effusion. Mitral Valve: The mitral valve is abnormal. There is mild thickening of the mitral valve leaflet(s). Trivial mitral valve regurgitation. Tricuspid Valve: The tricuspid valve is normal in structure. Tricuspid valve regurgitation is mild to moderate. Aortic Valve: The aortic valve is tricuspid. There is moderate calcification of the aortic valve. There is moderate thickening of the aortic valve. Aortic valve regurgitation is trivial. Aortic valve sclerosis/calcification is present, without any evidence of aortic stenosis. Aortic valve mean gradient measures 6.0 mmHg. Aortic valve peak gradient measures 8.4 mmHg. Aortic valve area, by VTI measures 2.11 cm. Pulmonic Valve: The pulmonic valve was normal in structure. Pulmonic valve regurgitation is trivial. No evidence of pulmonic stenosis. Aorta: Aortic dilatation noted. There is borderline dilatation of the aortic root, measuring 38 mm. Pulmonary Artery: The pulmonary artery is of normal size. Venous: The inferior vena cava is dilated in size with less than 50% respiratory variability, suggesting right atrial pressure of 15 mmHg. IAS/Shunts: The interatrial septum was not well visualized.  LEFT VENTRICLE PLAX 2D LVIDd:         4.91 cm LVIDs:         3.03 cm LV PW:         1.65 cm LV IVS:        1.70 cm LVOT diam:     2.00 cm LV SV:         62 LV SV Index:   26 LVOT Area:     3.14 cm  RIGHT VENTRICLE RV Basal diam:  5.25 cm TAPSE (M-mode): 1.4 cm LEFT ATRIUM             Index        RIGHT ATRIUM           Index LA diam:        4.00 cm 1.70 cm/m   RA Area:     22.60 cm LA Vol (A2C):   71.3 ml 30.26 ml/m  RA Volume:   61.20 ml  25.97 ml/m LA Vol (A4C):   63.2 ml 26.82 ml/m LA Biplane Vol: 71.3 ml 30.26 ml/m  AORTIC VALVE                     PULMONIC VALVE AV Area (Vmax):    2.04 cm      PR End Diast Vel: 9.73 msec AV Area (Vmean):   1.99 cm AV Area (VTI):     2.11 cm AV Vmax:           145.00 cm/s AV Vmean:           115.000 cm/s AV VTI:            0.292 m AV Peak Grad:      8.4 mmHg AV Mean Grad:      6.0 mmHg LVOT Vmax:         94.20 cm/s LVOT Vmean:        73.000 cm/s LVOT VTI:          0.196 m LVOT/AV VTI ratio: 0.67  AORTA Ao Root diam: 3.80 cm MITRAL VALVE                TRICUSPID VALVE MV Area (PHT): 4.60 cm     TR Peak grad:   63.4 mmHg MV Decel Time: 165 msec     TR Vmax:        398.00 cm/s MV E velocity: 123.00 cm/s  SHUNTS                             Systemic VTI:  0.20 m                             Systemic Diam: 2.00 cm Nelva Bush MD Electronically signed by Nelva Bush MD Signature Date/Time: 04/01/2022/5:08:58 PM    Final         Scheduled Meds:  apixaban  5 mg Oral BID   atorvastatin  80 mg Oral QPM   insulin aspart  0-6 Units Subcutaneous TID WC   metoprolol tartrate  25 mg Oral BID   nicotine  21 mg Transdermal Daily   pantoprazole  40 mg Oral QAC breakfast   sodium chloride flush  3 mL Intravenous Q12H   traZODone  50 mg Oral QHS   Continuous Infusions:  furosemide (LASIX) 200 mg in dextrose 5 % 100 mL (2 mg/mL) infusion 6 mg/hr (04/03/22 0337)     LOS: 2 days    Time spent: 35 minutes    Georgette Shell, MD 04/03/2022, 12:21 PM

## 2022-04-03 NOTE — Progress Notes (Signed)
Physical Therapy Treatment Patient Details Name: Joshua Roy MRN: [id number] DOB: January 05, 1951 Today's Date: 04/03/2022   History of Present Illness Joshua Roy is a 71 y.o. male with medical history significant of HTN, HLD, HFpEF, PAF, PSVT CAD, COPD, CKD stage III/IV, diabetes mellitus type 2, anxiety, and tobacco abuse presents with complaints of progressively worsening shortness of breath.  Patient admits that his shortness of breath worsens with any kind of activity.  Over the last 2 weeks he has gained approximately 20 pounds.  Reports associated symptoms of bilateral lower extremity swelling and orthopnea.  For at least the last week he had increased his Lasix from 40 mg twice daily to 80 mg twice daily without significant change in his symptoms.    PT Comments    Pt sitting EOB upon arrival.  Does report some increased swelling today but stated he has kept his legs dependant for quite some time today.  He is encouraged to elevate LE's upon return to room and voices understanding.  He is able to stand and progress gait 200' pushing IV pole.  Sats remains +/- 90% on 4 lpm with gait and deep breathing.  Overall does well.  Pt does  state he initially declined cardiac rehab but after thinking about it he would like to try it.  Will discuss with TOC.   Recommendations for follow up therapy are one component of a multi-disciplinary discharge planning process, led by the attending physician.  Recommendations may be updated based on patient status, additional functional criteria and insurance authorization.  Follow Up Recommendations  Home health PT     Assistance Recommended at Discharge Intermittent Supervision/Assistance  Patient can return home with the following A little help with walking and/or transfers;A little help with bathing/dressing/bathroom;Help with stairs or ramp for entrance;Assistance with cooking/housework   Equipment Recommendations  None recommended by PT     Recommendations for Other Services       Precautions / Restrictions Precautions Precautions: Fall Restrictions Weight Bearing Restrictions: No     Mobility  Bed Mobility Overal bed mobility: Independent                  Transfers Overall transfer level: Modified independent                      Ambulation/Gait Ambulation/Gait assistance: Supervision, Modified independent (Device/Increase time) Gait Distance (Feet): 200 Feet Assistive device: IV Pole Gait Pattern/deviations: Step-through pattern, Decreased step length - right, Decreased step length - left Gait velocity: decr     General Gait Details: steady with IV pole   Stairs             Wheelchair Mobility    Modified Rankin (Stroke Patients Only)       Balance Overall balance assessment: Modified Independent                                          Cognition Arousal/Alertness: Awake/alert Behavior During Therapy: WFL for tasks assessed/performed Overall Cognitive Status: Within Functional Limits for tasks assessed                                          Exercises      General Comments        Pertinent  Vitals/Pain Pain Assessment Pain Assessment: No/denies pain    Home Living                          Prior Function            PT Goals (current goals can now be found in the care plan section) Progress towards PT goals: Progressing toward goals    Frequency    Min 2X/week      PT Plan      Co-evaluation              AM-PAC PT "6 Clicks" Mobility   Outcome Measure  Help needed turning from your back to your side while in a flat bed without using bedrails?: None Help needed moving from lying on your back to sitting on the side of a flat bed without using bedrails?: None Help needed moving to and from a bed to a chair (including a wheelchair)?: None Help needed standing up from a chair using your arms  (e.g., wheelchair or bedside chair)?: None Help needed to walk in hospital room?: A Little Help needed climbing 3-5 steps with a railing? : A Little 6 Click Score: 22    End of Session Equipment Utilized During Treatment: Gait belt;Oxygen Activity Tolerance: Patient tolerated treatment well Patient left: in chair;with call bell/phone within reach Nurse Communication: Mobility status PT Visit Diagnosis: Muscle weakness (generalized) (M62.81)     Time: 8616-8372 PT Time Calculation (min) (ACUTE ONLY): 13 min  Charges:  $Gait Training: 8-22 mins                    Chesley Noon, PTA 04/03/22, 2:55 PM

## 2022-04-03 NOTE — TOC Benefit Eligibility Note (Signed)
Patient Research scientist (life sciences) completed.     The patient is currently admitted and upon discharge could be taking Eliquis '5mg'$ .   The current 30 day co-pay is, $512.74  The patient is insured through [employer]. Medicare part D

## 2022-04-03 NOTE — Telephone Encounter (Signed)
Pharmacy Patient Advocate Encounter  Insurance verification completed.    The patient is insured through [place] [employer]  The patient is currently admitted and ran test claims for the following: ELIQUIS '5MG'$ .  Copays and coinsurance results were relayed to Inpatient clinical team.

## 2022-04-04 ENCOUNTER — Ambulatory Visit: Admission: RE | Admit: 2022-04-04 | Payer: Medicare Other | Source: Ambulatory Visit

## 2022-04-04 DIAGNOSIS — J9601 Acute respiratory failure with hypoxia: Secondary | ICD-10-CM | POA: Diagnosis not present

## 2022-04-04 LAB — BASIC METABOLIC PANEL
Anion gap: 7 (ref 5–15)
BUN: 57 mg/dL — ABNORMAL HIGH (ref 8–23)
CO2: 37 mmol/L — ABNORMAL HIGH (ref 22–32)
Calcium: 9.2 mg/dL (ref 8.9–10.3)
Chloride: 95 mmol/L — ABNORMAL LOW (ref 98–111)
Creatinine, Ser: 3.25 mg/dL — ABNORMAL HIGH (ref 0.61–1.24)
GFR, Estimated: 19 mL/min — ABNORMAL LOW (ref >60)
Glucose, Bld: 149 mg/dL — ABNORMAL HIGH (ref 70–99)
Potassium: 4.1 mmol/L (ref 3.5–5.1)
Sodium: 139 mmol/L (ref 135–145)

## 2022-04-04 LAB — GLUCOSE, CAPILLARY
Glucose-Capillary: 125 mg/dL — ABNORMAL HIGH (ref 70–99)
Glucose-Capillary: 294 mg/dL — ABNORMAL HIGH (ref 70–99)

## 2022-04-04 MED ORDER — GLIPIZIDE 5 MG PO TABS: 2.5000 mg | ORAL_TABLET | Freq: Every day | ORAL | 2 refills | Status: DC

## 2022-04-04 MED ORDER — APIXABAN 5 MG PO TABS: 5.0000 mg | ORAL_TABLET | Freq: Two times a day (BID) | ORAL | 1 refills | Status: DC

## 2022-04-04 MED ORDER — GLIPIZIDE 5 MG PO TABS: 2.5000 mg | ORAL_TABLET | Freq: Every day | ORAL | Status: DC

## 2022-04-04 MED ORDER — FUROSEMIDE 40 MG PO TABS: ORAL_TABLET | ORAL | 11 refills | Status: DC

## 2022-04-04 NOTE — Progress Notes (Signed)
[facility]  ROUNDING NOTE   Subjective:   Joshua Roy is a 71 year old male with past medical conditions including hyperlipidemia, hypertension, COPD, diabetes, and chronic kidney disease.  Patient presents to the emergency department with shortness of breath and has been admitted for COPD exacerbation ([place]) [J44.1] Acute respiratory failure with hypoxia ([place]) [J96.01] Congestive heart failure, unspecified HF chronicity, unspecified heart failure type [facility]) [I50.9]  Patient is known to our practice and is followed by Dr. Zollie Scale outpatient.    Patient seen sitting at side of bed, eating breakfast Respiratory status improved  Lower extremity edema improved.  O2 4L   Objective:  Vital signs in last 24 hours:  Temp:  [97.5 F (36.4 C)-98.2 F (36.8 C)] 97.5 F (36.4 C) (09/08 1141) Pulse Rate:  [68-91] 72 (09/08 1141) Resp:  [17-18] 18 (09/08 1141) BP: (91-137)/(57-68) 137/68 (09/08 1141) SpO2:  [90 %-98 %] 93 % (09/08 1200) Weight:  [115.7 kg] 115.7 kg (09/08 0427)  Weight change: -2.054 kg Filed Weights   04/02/22 0500 04/03/22 0500 04/04/22 0427  Weight: 117.8 kg 117.8 kg 115.7 kg    Intake/Output: I/O last 3 completed shifts: In: 9622 [P.O.:1120; I.V.:138] Out: 5120 [Urine:5120]   Intake/Output this shift:  Total I/O In: 240 [P.O.:240] Out: 1150 [Urine:1150]  Physical Exam: General: NAD  Head: Normocephalic, atraumatic. Moist oral mucosal membranes  Eyes: Anicteric  Lungs:  Diminished bases, normal effort, [place] O2  Heart: Regular rate and rhythm  Abdomen:  Soft, nontender  Extremities: 2+ peripheral edema.  Neurologic: Nonfocal, moving all four extremities  Skin: No lesions  Access: None    Basic Metabolic Panel: Recent Labs  Lab 03/28/22 1349 04/01/22 1052 04/02/22 0418 04/03/22 0511 04/04/22 0423  NA 136 138 140 138 139  K 6.0* 5.2* 4.3 4.4 4.1  CL 97* 97* 99 94* 95*  CO2 '29 31 31 '$ 33* 37*  GLUCOSE 67* 165* 126* 143* 149*   BUN 82* 69* 63* 62* 57*  CREATININE 5.45* 3.94* 3.62* 3.51* 3.25*  CALCIUM 8.8* 9.6 9.4 9.6 9.2  PHOS 5.6*  --   --   --   --      Liver Function Tests: Recent Labs  Lab 03/28/22 1349  ALBUMIN 3.8    No results for input(s): "LIPASE", "AMYLASE" in the last 168 hours. No results for input(s): "AMMONIA" in the last 168 hours.  CBC: Recent Labs  Lab 04/01/22 1052 04/02/22 0418  WBC 7.1 6.6  HGB 11.5* 11.4*  HCT 39.9 39.7  MCV 77.6* 77.5*  PLT 242 234     Cardiac Enzymes: No results for input(s): "CKTOTAL", "CKMB", "CKMBINDEX", "TROPONINI" in the last 168 hours.  BNP: Invalid input(s): "POCBNP"  CBG: Recent Labs  Lab 04/03/22 1144 04/03/22 1540 04/03/22 2030 04/04/22 0755 04/04/22 1138  GLUCAP 220* 236* 225* 125* 294*     Microbiology: Results for orders placed or performed during the hospital encounter of 04/01/22  Resp Panel by RT-PCR (Flu A&B, Covid) Anterior Nasal Swab     Status: None   Collection Time: 04/01/22 11:25 AM   Specimen: Anterior Nasal Swab  Result Value Ref Range Status   SARS Coronavirus 2 by RT PCR NEGATIVE NEGATIVE Final    Comment: (NOTE) SARS-CoV-2 target nucleic acids are NOT DETECTED.  The SARS-CoV-2 RNA is generally detectable in upper respiratory specimens during the acute phase of infection. The lowest concentration of SARS-CoV-2 viral copies this assay can detect is 138 copies/mL. A negative result does not preclude SARS-Cov-2 infection  and should not be used as the sole basis for treatment or other patient management decisions. A negative result may occur with  improper specimen collection/handling, submission of specimen other than nasopharyngeal swab, presence of viral mutation(s) within the areas targeted by this assay, and inadequate number of viral copies(<138 copies/mL). A negative result must be combined with clinical observations, patient history, and epidemiological information. The expected result is  Negative.  Fact Sheet for Patients:  EntrepreneurPulse.com.au  Fact Sheet for Healthcare Providers:  IncredibleEmployment.be  This test is no t yet approved or cleared by the [place] FDA and  has been authorized for detection and/or diagnosis of SARS-CoV-2 by FDA under an Emergency Use Authorization (EUA). This EUA will remain  in effect (meaning this test can be used) for the duration of the COVID-19 declaration under Section 564(b)(1) of the Act, 21 U.S.C.section 360bbb-3(b)(1), unless the authorization is terminated  or revoked sooner.       Influenza A by PCR NEGATIVE NEGATIVE Final   Influenza B by PCR NEGATIVE NEGATIVE Final    Comment: (NOTE) The Xpert Xpress SARS-CoV-2/FLU/RSV plus assay is intended as an aid in the diagnosis of influenza from Nasopharyngeal swab specimens and should not be used as a sole basis for treatment. Nasal washings and aspirates are unacceptable for Xpert Xpress SARS-CoV-2/FLU/RSV testing.  Fact Sheet for Patients: EntrepreneurPulse.com.au  Fact Sheet for Healthcare Providers: IncredibleEmployment.be  This test is not yet approved or cleared by the [place] FDA and has been authorized for detection and/or diagnosis of SARS-CoV-2 by FDA under an Emergency Use Authorization (EUA). This EUA will remain in effect (meaning this test can be used) for the duration of the COVID-19 declaration under Section 564(b)(1) of the Act, 21 U.S.C. section 360bbb-3(b)(1), unless the authorization is terminated or revoked.  Performed at [facility], [address] [place], [place] [address]     Coagulation Studies: No results for input(s): "LABPROT", "INR" in the last 72 hours.  Urinalysis: No results for input(s): "COLORURINE", "LABSPEC", "PHURINE", "GLUCOSEU", "HGBUR", "BILIRUBINUR", "KETONESUR", "PROTEINUR", "UROBILINOGEN", "NITRITE", "LEUKOCYTESUR" in the  last 72 hours.  Invalid input(s): "APPERANCEUR"    Imaging: No results found.   Medications:    furosemide (LASIX) 200 mg in dextrose 5 % 100 mL (2 mg/mL) infusion 6 mg/hr (04/03/22 0337)    apixaban  5 mg Oral BID   atorvastatin  80 mg Oral QPM   insulin aspart  0-6 Units Subcutaneous TID WC   metoprolol tartrate  25 mg Oral BID   nicotine  21 mg Transdermal Daily   pantoprazole  40 mg Oral QAC breakfast   sodium chloride flush  3 mL Intravenous Q12H   traZODone  50 mg Oral QHS   acetaminophen **OR** acetaminophen, albuterol, ALPRAZolam, ondansetron **OR** ondansetron (ZOFRAN) IV  Assessment/ Plan:  Joshua Roy is a 71 y.o.  male with past medical conditions including hyperlipidemia, hypertension, COPD, diabetes, and chronic kidney disease.  Patient presents to the emergency department with shortness of breath and has been admitted for COPD exacerbation ([place]) [J44.1] Acute respiratory failure with hypoxia ([place]) [J96.01] Congestive heart failure, unspecified HF chronicity, unspecified heart failure type ([place]) [I50.9]    Acute Kidney Injury on chronic kidney disease stage IV with baseline creatinine 2.09 and GFR of 33 on 12/24/21.  Acute kidney injury secondary to aggressive outpatient diuresis and increase fluid volume.  No IV contrast exposure.    Successful furosemide drip with fluid removal. Adequate UOP.  Recommend restarting furosemide at  discharge.  Furosemide 80 mg in the morning and furosemide 40 mg in the evenings.  Patient cleared to discharge from renal stance and continue outpatient follow-up in our office.  Lab Results  Component Value Date   CREATININE 3.25 (H) 04/04/2022   CREATININE 3.51 (H) 04/03/2022   CREATININE 3.62 (H) 04/02/2022    Intake/Output Summary (Last 24 hours) at 04/04/2022 1223 Last data filed at 04/04/2022 1149 Gross per 24 hour  Intake 1072.99 ml  Output 3930 ml  Net -2857.01 ml    2.  Hypertension with chronic kidney disease.   Home regimen includes furosemide, losartan and metoprolol.  Currently receiving metoprolol with furosemide drip.  We will transition to oral furosemide at discharge as above.  3. Anemia of chronic kidney disease Lab Results  Component Value Date   HGB 11.4 (L) 04/02/2022    Hemoglobin within acceptable range.     LOS: [place] 9/8/202312:23 PM

## 2022-04-04 NOTE — Discharge Summary (Signed)
Physician Discharge Summary  Joshua Roy NOB:[id number] DOB: 02/10/1951 DOA: 04/01/2022  PCP: [name], MD  Admit date: 04/01/2022 Discharge date: 04/04/2022  Admitted From: Home Disposition: Home  Recommendations for Outpatient Follow-up:  Follow up with PCP in 1-2 weeks Please obtain BMP/CBC in one week Please follow up with cardiology for DC cardioversion in 4 weeks  Home Health: None Equipment/Devices: Yes Discharge Condition: Stable able CODE STATUS: Full Diet recommendation: Heart healthy Brief/Interim Summary:  Joshua Roy is a 71 y.o. male with medical history significant of HTN, HLD, HFpEF, PAF, PSVT CAD, COPD, CKD stage III/IV, diabetes mellitus type 2, anxiety, and tobacco abuse presents with complaints of progressively worsening shortness of breath.  Patient admits that his shortness of breath worsens with any kind of activity.  Over the last 2 weeks he has gained approximately 20 pounds.  Reports associated symptoms of bilateral lower extremity swelling and orthopnea.  For at least the last week he had increased his Lasix from 40 mg twice daily to 80 mg twice daily without significant change in his symptoms.  He also had notified his nephrologist Dr. Holley Raring in regards to his weight gain and had been set up to received outpatient IV Lasix infusions.  His last infusion was 4 days ago, but over the holiday weekend and his symptoms worsen and he was advised to come to the hospital for further evaluation.  During this time he has not had any significant fever, cough, nausea, vomiting, diarrhea, or abdominal pain symptoms.  Patient reports that he still smokes about a pack cigarettes per day on average. Need of dialysis had not been brought up.   Upon admission to the emergency department patient was noted to be afebrile, tachypneic, and O2 saturations as low as 78% with improvement to greater than 92% on 3 L of nasal cannula oxygen.  Labs were significant for  potassium 5.2, BUN 69, creatinine 3.94, BNP 267.2, and high-sensitivity troponin 22.  Chest x-ray showed stable appearance without active disease.  Patient was given Lasix 80 mg IV. Discharge Diagnoses:  Principal Problem:   Acute respiratory failure with hypoxia (HCC) Active Problems:   (HFpEF) heart failure with preserved ejection fraction (HCC)   Hyperkalemia   Elevated troponin   Abnormal EKG   Chronic kidney disease   Iron deficiency anemia   CAD (coronary artery disease)   COPD (chronic obstructive pulmonary disease) (HCC)   T2DM (type 2 diabetes mellitus) ([place])   Hyperlipidemia   Tobacco abuse   Essential hypertension   Acute on chronic diastolic heart failure (HCC)   Anxiety   Typical atrial flutter (HCC)    #1 acute hypoxic respiratory failure due to heart failure with diastolic dysfunction complicated by CKD stage IV. His oxygen saturation on admission was 78% on room air. He was admitted with 20 pound weight gain and bilateral lower extremity swelling PND and orthopnea. He was treated with Lasix drip with improvement in his symptoms.  He lost 5 pounds.  He was followed by nephrology and cardiology. Influenza and COVID-negative. Seen by cardiology started Eliquis and stopped Plavix for new onset atrial flutter Plan for Eliquis for 4 weeks and then cardioversion as an outpatient echocardiogram 04/01/2022-ejection fraction 60 to 65%.  Severe LVH.  Right ventricular systolic function is mildly reduced.  Severely elevated pulmonary artery systolic pressure.    #2 CKD stage IV-creatinine 3.25 from 3.51 from 3.62 from 3.94 on Lasix drip.  His creatinine was 3.25 on discharge.  He is very likely  going to need dialysis in the near future.     #3 hyperlipidemia on statin Fenofibrate stopped due to CKD    #4 tobacco abuse -counseled regarding cessation   #5 history of essential hypertension on metoprolol, low-dose heart and was stopped on discharge.    #6 hyperkalemia resolved     #7 type 2 diabetes on metformin and Amaryl prior to admission.  Amaryl and metformin was stopped on discharge started glipizide at a lower dose 2.5 mg daily due to CKD. CBG (last 3)  Recent Labs (last 2 labs)        Recent Labs    04/02/22 2119 04/03/22 0815 04/03/22 1144  GLUCAP 170* 150* 220*     #8 iron deficiency anemia here is given a unit of Feraheme on 04/03/2022.  Estimated body mass index is 36.6 kg/m as calculated from the following:   Height as of 03/18/22: '5\' 10"'$  (1.778 m).   Weight as of this encounter: 115.7 kg.  Discharge Instructions  Discharge Instructions     Avoid straining   Complete by: As directed    Call MD for:  difficulty breathing, headache or visual disturbances   Complete by: As directed    Call MD for:  persistant dizziness or light-headedness   Complete by: As directed    Call MD for:  persistant nausea and vomiting   Complete by: As directed    Diet - low sodium heart healthy   Complete by: As directed    Heart Failure patients record your daily weight using the same scale at the same time of day   Complete by: As directed    Increase activity slowly   Complete by: As directed       Allergies as of 04/04/2022       Reactions   Ramipril Cough, Other (See Comments)   REACTION: Cough   Pseudoephedrine-guaifenesin Er Other (See Comments), Hypertension   Elevated BP, insomnia Elevated BP, insomnia        Medication List     STOP taking these medications    clopidogrel 75 MG tablet Commonly known as: PLAVIX   fenofibrate 160 MG tablet   glimepiride 2 MG tablet Commonly known as: AMARYL   losartan 50 MG tablet Commonly known as: COZAAR   metFORMIN 1000 MG tablet Commonly known as: GLUCOPHAGE   sildenafil 20 MG tablet Commonly known as: REVATIO       TAKE these medications    ALPRAZolam 0.5 MG tablet Commonly known as: Xanax Take 1 tablet (0.5 mg total) by mouth at bedtime as needed for anxiety.   apixaban 5 MG Tabs  tablet Commonly known as: ELIQUIS Take 1 tablet (5 mg total) by mouth 2 (two) times daily.   atorvastatin 80 MG tablet Commonly known as: LIPITOR TAKE 1 TABLET BY MOUTH EVERY DAY   furosemide 40 MG tablet Commonly known as: Lasix Take 2 tablets in the morning and 1 tablet at night that is 80 mg in the morning and 40 mg at night What changed:  how much to take how to take this when to take this additional instructions   glipiZIDE 5 MG tablet Commonly known as: GLUCOTROL Take 0.5 tablets (2.5 mg total) by mouth daily before breakfast. Start taking on: April 05, 2022   iron polysaccharides 150 MG capsule Commonly known as: NIFEREX Take 1 capsule (150 mg total) by mouth daily.   metoprolol tartrate 25 MG tablet Commonly known as: LOPRESSOR Take 1 tablet (25 mg total) by  mouth 2 (two) times daily.   nitroGLYCERIN 0.4 MG SL tablet Commonly known as: NITROSTAT Place 0.4 mg under the tongue every 5 (five) minutes as needed for chest pain.   pantoprazole 40 MG tablet Commonly known as: PROTONIX TAKE 1 TABLET BY MOUTH DAILY BEFORE BREAKFAST   traZODone 50 MG tablet Commonly known as: DESYREL TAKE 1 TABLET BY MOUTH EVERY DAY   True Metrix Blood Glucose Test test strip Generic drug: glucose blood 1 each by Other route daily. Use as instructed        Follow-up Information     [name], MD Follow up.   Specialties: Internal Medicine, Cardiology Contact information: [place] [address] [phone]         Minna Merritts, MD Follow up.   Specialty: Cardiology Contact information: [place] [address] [phone]                Allergies  Allergen Reactions   Ramipril Cough and Other (See Comments)    REACTION: Cough   Pseudoephedrine-Guaifenesin Er Other (See Comments) and Hypertension    Elevated BP, insomnia Elevated BP, insomnia    Consultations: Cardiology and  nephrology   Procedures/Studies: ECHOCARDIOGRAM COMPLETE  Result Date: 04/01/2022    ECHOCARDIOGRAM REPORT   Patient Name:   Joshua Roy Date of Exam: 04/01/2022 Medical Rec #:  [id number]            Height:       70.0 in Accession #:    [id number]           Weight:       266.0 lb Date of Birth:  18-Dec-1950           BSA:          2.356 m Patient Age:    71 years             BP:           103/77 mmHg Patient Gender: M                    HR:           83 bpm. Exam Location:  [facility] Procedure: 2D Echo, Cardiac Doppler and Color Doppler Indications:     Elevated Troponin  History:         Patient has prior history of Echocardiogram examinations, most                  recent 08/12/2021. CHF, CAD, Prior CABG and Carotid                  endarterectomy, COPD, Arrythmias:Atrial Fibrillation; Risk                  Factors:Current Smoker, Diabetes, Hypertension, Dyslipidemia                  and Sleep Apnea.  Sonographer:     Rosalia Hammers Referring Phys:  [id number] RONDELL A SMITH Diagnosing Phys: Nelva Bush MD  Sonographer Comments: Suboptimal apical window. Image acquisition challenging due to patient body habitus, Image acquisition challenging due to COPD and Image acquisition challenging due to respiratory motion. IMPRESSIONS  1. Left ventricular ejection fraction, by estimation, is 60 to 65%. The left ventricle has normal function. Left ventricular endocardial border not optimally defined to evaluate regional wall motion. There is severe left ventricular hypertrophy. Left ventricular diastolic parameters are indeterminate.  2. Right  ventricular systolic function is mildly reduced. The right ventricular size is moderately enlarged. Severely increased right ventricular wall thickness. There is severely elevated pulmonary artery systolic pressure. The estimated right ventricular systolic pressure is 62.2 mmHg.  3. Left atrial size was upper normal.  4. Right atrial size was mildly dilated.  5. The mitral valve  is abnormal. Trivial mitral valve regurgitation.  6. Tricuspid valve regurgitation is mild to moderate.  7. The aortic valve is tricuspid. There is moderate calcification of the aortic valve. There is moderate thickening of the aortic valve. Aortic valve regurgitation is trivial. Aortic valve sclerosis/calcification is present, without any evidence of aortic stenosis.  8. Aortic dilatation noted. There is borderline dilatation of the aortic root, measuring 38 mm.  9. The inferior vena cava is dilated in size with <50% respiratory variability, suggesting right atrial pressure of 15 mmHg. FINDINGS  Left Ventricle: Left ventricular ejection fraction, by estimation, is 60 to 65%. The left ventricle has normal function. Left ventricular endocardial border not optimally defined to evaluate regional wall motion. The left ventricular internal cavity size was normal in size. There is severe left ventricular hypertrophy. Left ventricular diastolic parameters are indeterminate. Right Ventricle: The right ventricular size is moderately enlarged. Severely increased right ventricular wall thickness. Right ventricular systolic function is mildly reduced. There is severely elevated pulmonary artery systolic pressure. The tricuspid regurgitant velocity is 3.98 m/s, and with an assumed right atrial pressure of 15 mmHg, the estimated right ventricular systolic pressure is 29.7 mmHg. Left Atrium: Left atrial size was upper normal. Right Atrium: Right atrial size was mildly dilated. Pericardium: There is no evidence of pericardial effusion. Mitral Valve: The mitral valve is abnormal. There is mild thickening of the mitral valve leaflet(s). Trivial mitral valve regurgitation. Tricuspid Valve: The tricuspid valve is normal in structure. Tricuspid valve regurgitation is mild to moderate. Aortic Valve: The aortic valve is tricuspid. There is moderate calcification of the aortic valve. There is moderate thickening of the aortic valve. Aortic  valve regurgitation is trivial. Aortic valve sclerosis/calcification is present, without any evidence of aortic stenosis. Aortic valve mean gradient measures 6.0 mmHg. Aortic valve peak gradient measures 8.4 mmHg. Aortic valve area, by VTI measures 2.11 cm. Pulmonic Valve: The pulmonic valve was normal in structure. Pulmonic valve regurgitation is trivial. No evidence of pulmonic stenosis. Aorta: Aortic dilatation noted. There is borderline dilatation of the aortic root, measuring 38 mm. Pulmonary Artery: The pulmonary artery is of normal size. Venous: The inferior vena cava is dilated in size with less than 50% respiratory variability, suggesting right atrial pressure of 15 mmHg. IAS/Shunts: The interatrial septum was not well visualized.  LEFT VENTRICLE PLAX 2D LVIDd:         4.91 cm LVIDs:         3.03 cm LV PW:         1.65 cm LV IVS:        1.70 cm LVOT diam:     2.00 cm LV SV:         62 LV SV Index:   26 LVOT Area:     3.14 cm  RIGHT VENTRICLE RV Basal diam:  5.25 cm TAPSE (M-mode): 1.4 cm LEFT ATRIUM             Index        RIGHT ATRIUM           Index LA diam:        4.00 cm 1.70 cm/m  RA Area:     22.60 cm LA Vol (A2C):   71.3 ml 30.26 ml/m  RA Volume:   61.20 ml  25.97 ml/m LA Vol (A4C):   63.2 ml 26.82 ml/m LA Biplane Vol: 71.3 ml 30.26 ml/m  AORTIC VALVE                     PULMONIC VALVE AV Area (Vmax):    2.04 cm      PR End Diast Vel: 9.73 msec AV Area (Vmean):   1.99 cm AV Area (VTI):     2.11 cm AV Vmax:           145.00 cm/s AV Vmean:          115.000 cm/s AV VTI:            0.292 m AV Peak Grad:      8.4 mmHg AV Mean Grad:      6.0 mmHg LVOT Vmax:         94.20 cm/s LVOT Vmean:        73.000 cm/s LVOT VTI:          0.196 m LVOT/AV VTI ratio: 0.67  AORTA Ao Root diam: 3.80 cm MITRAL VALVE                TRICUSPID VALVE MV Area (PHT): 4.60 cm     TR Peak grad:   63.4 mmHg MV Decel Time: 165 msec     TR Vmax:        398.00 cm/s MV E velocity: 123.00 cm/s                              SHUNTS                             Systemic VTI:  0.20 m                             Systemic Diam: 2.00 cm Nelva Bush MD Electronically signed by Nelva Bush MD Signature Date/Time: 04/01/2022/5:08:58 PM    Final    DG Chest 2 View  Result Date: 04/01/2022 CLINICAL DATA:  71 year old male with shortness of breath, fluid overload. EXAM: CHEST - 2 VIEW COMPARISON:  August 09, 2021 FINDINGS: Trachea midline. Cardiomediastinal contours and hilar structures are stable. Central pulmonary vasculature suggest mild engorgement. No signs of frank edema.  No lobar consolidation. No pneumothorax. On limited assessment no acute skeletal findings. 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 IMPRESSION: No active cardiopulmonary disease. Electronically Signed   By: Zetta Bills M.D.   On: 04/01/2022 11:24   (Echo, Carotid, EGD, Colonoscopy, ERCP)    Subjective: HEENT is resting in bed he slept in bed overnight instructed sleeping in the recliner.  He feels better and is very anxious to go home he still has 2+ lower extremity edema but he states that is very better than before  Discharge Exam: Vitals:   04/04/22 1141 04/04/22 1200  BP: 137/68   Pulse: 72   Resp: 18   Temp: (!) 97.5 F (36.4 C)   SpO2: 93% 93%   Vitals:   04/04/22 0456 04/04/22 0754 04/04/22 1141 04/04/22 1200  BP: 119/64 120/64 137/68   Pulse: 88 84 72   Resp:  17 18  Temp:  (!) 97.5 F (36.4 C) (!) 97.5 F (36.4 C)   TempSrc:   Oral   SpO2:  90% 93% 93%  Weight:        General: Pt is alert, awake, not in acute distress Cardiovascular: RRR, S1/S2 +, no rubs, no gallops Respiratory: CTA bilaterally, no wheezing, no rhonchi Abdominal: Soft, NT, ND, bowel sounds + Extremities: 2+ edema  The results of significant diagnostics from this hospitalization (including imaging, microbiology, ancillary and laboratory) are listed below for reference.     Microbiology: Recent  Results (from the past 240 hour(s))  Resp Panel by RT-PCR (Flu A&B, Covid) Anterior Nasal Swab     Status: None   Collection Time: 04/01/22 11:25 AM   Specimen: Anterior Nasal Swab  Result Value Ref Range Status   SARS Coronavirus 2 by RT PCR NEGATIVE NEGATIVE Final    Comment: (NOTE) SARS-CoV-2 target nucleic acids are NOT DETECTED.  The SARS-CoV-2 RNA is generally detectable in upper respiratory specimens during the acute phase of infection. The lowest concentration of SARS-CoV-2 viral copies this assay can detect is 138 copies/mL. A negative result does not preclude SARS-Cov-2 infection and should not be used as the sole basis for treatment or other patient management decisions. A negative result may occur with  improper specimen collection/handling, submission of specimen other than nasopharyngeal swab, presence of viral mutation(s) within the areas targeted by this assay, and inadequate number of viral copies(<138 copies/mL). A negative result must be combined with clinical observations, patient history, and epidemiological information. The expected result is Negative.  Fact Sheet for Patients:  EntrepreneurPulse.com.au  Fact Sheet for Healthcare Providers:  IncredibleEmployment.be  This test is no t yet approved or cleared by the [place] FDA and  has been authorized for detection and/or diagnosis of SARS-CoV-2 by FDA under an Emergency Use Authorization (EUA). This EUA will remain  in effect (meaning this test can be used) for the duration of the COVID-19 declaration under Section 564(b)(1) of the Act, 21 U.S.C.section 360bbb-3(b)(1), unless the authorization is terminated  or revoked sooner.       Influenza A by PCR NEGATIVE NEGATIVE Final   Influenza B by PCR NEGATIVE NEGATIVE Final    Comment: (NOTE) The Xpert Xpress SARS-CoV-2/FLU/RSV plus assay is intended as an aid in the diagnosis of influenza from Nasopharyngeal swab  specimens and should not be used as a sole basis for treatment. Nasal washings and aspirates are unacceptable for Xpert Xpress SARS-CoV-2/FLU/RSV testing.  Fact Sheet for Patients: EntrepreneurPulse.com.au  Fact Sheet for Healthcare Providers: IncredibleEmployment.be  This test is not yet approved or cleared by the [place] FDA and has been authorized for detection and/or diagnosis of SARS-CoV-2 by FDA under an Emergency Use Authorization (EUA). This EUA will remain in effect (meaning this test can be used) for the duration of the COVID-19 declaration under Section 564(b)(1) of the Act, 21 U.S.C. section 360bbb-3(b)(1), unless the authorization is terminated or revoked.  Performed at [facility], [address] [place], [place] [address]      Labs: BNP (last 3 results) Recent Labs    08/09/21 1430 08/11/21 0547 04/01/22 1052  BNP 428.4* 813.7* 384.6*   Basic Metabolic Panel: Recent Labs  Lab 04/01/22 1052 04/02/22 0418 04/03/22 0511 04/04/22 0423  NA 138 140 138 139  K 5.2* 4.3 4.4 4.1  CL 97* 99 94* 95*  CO2 31 31 33* 37*  GLUCOSE 165* 126* 143* 149*  BUN 69* 63* 62* 57*  CREATININE 3.94* 3.62* 3.51* 3.25*  CALCIUM 9.6 9.4 9.6 9.2   Liver Function Tests: No results for input(s): "AST", "ALT", "ALKPHOS", "BILITOT", "PROT", "ALBUMIN" in the last 168 hours. No results for input(s): "LIPASE", "AMYLASE" in the last 168 hours. No results for input(s): "AMMONIA" in the last 168 hours. CBC: Recent Labs  Lab 04/01/22 1052 04/02/22 0418  WBC 7.1 6.6  HGB 11.5* 11.4*  HCT 39.9 39.7  MCV 77.6* 77.5*  PLT 242 234   Cardiac Enzymes: No results for input(s): "CKTOTAL", "CKMB", "CKMBINDEX", "TROPONINI" in the last 168 hours. BNP: Invalid input(s): "POCBNP" CBG: Recent Labs  Lab 04/03/22 1144 04/03/22 1540 04/03/22 2030 04/04/22 0755 04/04/22 1138  GLUCAP 220* 236* 225* 125* 294*   D-Dimer No results  for input(s): "DDIMER" in the last 72 hours. Hgb A1c No results for input(s): "HGBA1C" in the last 72 hours. Lipid Profile No results for input(s): "CHOL", "HDL", "LDLCALC", "TRIG", "CHOLHDL", "LDLDIRECT" in the last 72 hours. Thyroid function studies No results for input(s): "TSH", "T4TOTAL", "T3FREE", "THYROIDAB" in the last 72 hours.  Invalid input(s): "FREET3" Anemia work up No results for input(s): "VITAMINB12", "FOLATE", "FERRITIN", "TIBC", "IRON", "RETICCTPCT" in the last 72 hours. Urinalysis    Component Value Date/Time   COLORURINE STRAW (A) 10/05/2019 0957   APPEARANCEUR CLEAR 10/05/2019 0957   LABSPEC 1.005 10/05/2019 0957   PHURINE 5.0 10/05/2019 0957   GLUCOSEU NEGATIVE 10/05/2019 0957   HGBUR NEGATIVE 10/05/2019 0957   BILIRUBINUR neg 05/28/2021 1202   KETONESUR NEGATIVE 10/05/2019 0957   PROTEINUR Negative 05/28/2021 1202   PROTEINUR NEGATIVE 10/05/2019 0957   UROBILINOGEN 1.0 05/28/2021 1202   NITRITE neg 05/28/2021 1202   NITRITE NEGATIVE 10/05/2019 0957   LEUKOCYTESUR Large (3+) (A) 05/28/2021 1202   LEUKOCYTESUR NEGATIVE 10/05/2019 0957   Sepsis Labs Recent Labs  Lab 04/01/22 1052 04/02/22 0418  WBC 7.1 6.6   Microbiology Recent Results (from the past 240 hour(s))  Resp Panel by RT-PCR (Flu A&B, Covid) Anterior Nasal Swab     Status: None   Collection Time: 04/01/22 11:25 AM   Specimen: Anterior Nasal Swab  Result Value Ref Range Status   SARS Coronavirus 2 by RT PCR NEGATIVE NEGATIVE Final    Comment: (NOTE) SARS-CoV-2 target nucleic acids are NOT DETECTED.  The SARS-CoV-2 RNA is generally detectable in upper respiratory specimens during the acute phase of infection. The lowest concentration of SARS-CoV-2 viral copies this assay can detect is 138 copies/mL. A negative result does not preclude SARS-Cov-2 infection and should not be used as the sole basis for treatment or other patient management decisions. A negative result may occur with   improper specimen collection/handling, submission of specimen other than nasopharyngeal swab, presence of viral mutation(s) within the areas targeted by this assay, and inadequate number of viral copies(<138 copies/mL). A negative result must be combined with clinical observations, patient history, and epidemiological information. The expected result is Negative.  Fact Sheet for Patients:  EntrepreneurPulse.com.au  Fact Sheet for Healthcare Providers:  IncredibleEmployment.be  This test is no t yet approved or cleared by the [place] FDA and  has been authorized for detection and/or diagnosis of SARS-CoV-2 by FDA under an Emergency Use Authorization (EUA). This EUA will remain  in effect (meaning this test can be used) for the duration of the COVID-19 declaration under Section 564(b)(1) of the Act, 21 U.S.C.section 360bbb-3(b)(1), unless the authorization is terminated  or revoked sooner.       Influenza A by PCR NEGATIVE NEGATIVE Final  Influenza B by PCR NEGATIVE NEGATIVE Final    Comment: (NOTE) The Xpert Xpress SARS-CoV-2/FLU/RSV plus assay is intended as an aid in the diagnosis of influenza from Nasopharyngeal swab specimens and should not be used as a sole basis for treatment. Nasal washings and aspirates are unacceptable for Xpert Xpress SARS-CoV-2/FLU/RSV testing.  Fact Sheet for Patients: EntrepreneurPulse.com.au  Fact Sheet for Healthcare Providers: IncredibleEmployment.be  This test is not yet approved or cleared by the [place] FDA and has been authorized for detection and/or diagnosis of SARS-CoV-2 by FDA under an Emergency Use Authorization (EUA). This EUA will remain in effect (meaning this test can be used) for the duration of the COVID-19 declaration under Section 564(b)(1) of the Act, 21 U.S.C. section 360bbb-3(b)(1), unless the authorization is terminated  or revoked.  Performed at [facility], [address], [place], [place] [address]      Time coordinating discharge:  38 minutes  SIGNED:  Georgette Shell, MD  [facility] 04/04/2022, 2:36 PM

## 2022-04-04 NOTE — Progress Notes (Signed)
Rounding Note    Patient Name: Joshua Roy Date of Encounter: 04/04/2022  [facility] Cardiologist: Dr. Rockey Situ  Subjective   Patient seen on a.m. rounds.  Denies any chest pain, chest pressure, or worsening shortness of breath.  He states that his edema has improved.  Continues on Lasix drip. -2.9 L output in the last 24 hours.  Inpatient Medications    Scheduled Meds:  apixaban  5 mg Oral BID   atorvastatin  80 mg Oral QPM   insulin aspart  0-6 Units Subcutaneous TID WC   metoprolol tartrate  25 mg Oral BID   nicotine  21 mg Transdermal Daily   pantoprazole  40 mg Oral QAC breakfast   sodium chloride flush  3 mL Intravenous Q12H   traZODone  50 mg Oral QHS   Continuous Infusions:  furosemide (LASIX) 200 mg in dextrose 5 % 100 mL (2 mg/mL) infusion 6 mg/hr (04/03/22 0337)   PRN Meds: acetaminophen **OR** acetaminophen, albuterol, ALPRAZolam, ondansetron **OR** ondansetron (ZOFRAN) IV   Vital Signs    Vitals:   04/04/22 0400 04/04/22 0427 04/04/22 0456 04/04/22 0754  BP: (!) 91/57  119/64 120/64  Pulse: 68  88 84  Resp: 18   17  Temp: 97.9 F (36.6 C)   (!) 97.5 F (36.4 C)  TempSrc:      SpO2: 94%   90%  Weight:  115.7 kg      Intake/Output Summary (Last 24 hours) at 04/04/2022 1022 Last data filed at 04/04/2022 0757 Gross per 24 hour  Intake 832.99 ml  Output 3750 ml  Net -2917.01 ml      04/04/2022    4:27 AM 04/03/2022    5:00 AM 04/02/2022    5:00 AM  Last 3 Weights  Weight (lbs) 255 lb 1.2 oz 259 lb 9.6 oz 259 lb 11.2 oz  Weight (kg) 115.7 kg 117.754 kg 117.799 kg      Telemetry    Atrial flutter rates of 80-90- Personally Reviewed  ECG    No new tracing- Personally Reviewed  Physical Exam   GEN: No acute distress.   Neck: No JVD Cardiac: RRR, no murmurs, rubs, or gallops.  Respiratory: Clear to auscultation bilaterally.  Respirations are unlabored at rest on 4 L O2 via nasal cannula GI: Soft, nontender, non-distended  MS:  1+ edema to BLE; No deformity. Neuro:  Nonfocal  Psych: Normal affect   Labs    High Sensitivity Troponin:   Recent Labs  Lab 04/01/22 1052 04/01/22 1406  TROPONINIHS 22* 22*     Chemistry Recent Labs  Lab 03/28/22 1349 04/01/22 1052 04/02/22 0418 04/03/22 0511 04/04/22 0423  NA 136   < > 140 138 139  K 6.0*   < > 4.3 4.4 4.1  CL 97*   < > 99 94* 95*  CO2 29   < > 31 33* 37*  GLUCOSE 67*   < > 126* 143* 149*  BUN 82*   < > 63* 62* 57*  CREATININE 5.45*   < > 3.62* 3.51* 3.25*  CALCIUM 8.8*   < > 9.4 9.6 9.2  ALBUMIN 3.8  --   --   --   --   GFRNONAA 10*   < > 17* 18* 19*  ANIONGAP 10   < > '10 11 7   '$ < > = values in this interval not displayed.    Lipids No results for input(s): "CHOL", "TRIG", "HDL", "LABVLDL", "LDLCALC", "CHOLHDL" in the  last 168 hours.  Hematology Recent Labs  Lab 04/01/22 1052 04/02/22 0418  WBC 7.1 6.6  RBC 5.14 5.12  HGB 11.5* 11.4*  HCT 39.9 39.7  MCV 77.6* 77.5*  MCH 22.4* 22.3*  MCHC 28.8* 28.7*  RDW 20.0* 20.0*  PLT 242 234   Thyroid  Recent Labs  Lab 04/01/22 1406  TSH 2.088    BNP Recent Labs  Lab 04/01/22 1052  BNP 267.2*    DDimer No results for input(s): "DDIMER" in the last 168 hours.   Radiology    No results found.  Cardiac Studies  TTE 04/01/22 1. Left ventricular ejection fraction, by estimation, is 60 to 65%. The  left ventricle has normal function. Left ventricular endocardial border  not optimally defined to evaluate regional wall motion. There is severe  left ventricular hypertrophy. Left  ventricular diastolic parameters are indeterminate.   2. Right ventricular systolic function is mildly reduced. The right  ventricular size is moderately enlarged. Severely increased right  ventricular wall thickness. There is severely elevated pulmonary artery  systolic pressure. The estimated right  ventricular systolic pressure is 10.6 mmHg.   3. Left atrial size was upper normal.   4. Right atrial size was  mildly dilated.   5. The mitral valve is abnormal. Trivial mitral valve regurgitation.   6. Tricuspid valve regurgitation is mild to moderate.   7. The aortic valve is tricuspid. There is moderate calcification of the  aortic valve. There is moderate thickening of the aortic valve. Aortic  valve regurgitation is trivial. Aortic valve sclerosis/calcification is  present, without any evidence of  aortic stenosis.   8. Aortic dilatation noted. There is borderline dilatation of the aortic  root, measuring 38 mm.   9. The inferior vena cava is dilated in size with <50% respiratory  variability, suggesting right atrial pressure of 15 mmHg.   Patient Profile     71 y.o. male with a history of CAD status post CABG in 09/2018 A-fib, HFpEF, DM 2, carotid artery disease status post CEA in 09/2019, CKD stage IV, hypertension, hyperlipidemia, obesity, and COPD with ongoing tobacco use who has been seen and evaluated for volume overload.  Assessment & Plan    New onset atrial flutter with variable AV block with a history of postoperative atrial fibrillation -Ventricular rates remain well controlled -Continue metoprolol 25 mg twice daily -CHA2DS2-VASc score of at least 5 (CHF, hypertension, age x72, DM, vascular disease) -Continue apixaban 5 mg twice daily -Echocardiogram as above -TSH normal -Outpatient DCCV after he has been adequately anticoagulated for minimum of 4 weeks without interruption if he does not spontaneously convert  Acute on chronic hypoxic respiratory failure/pulmonary hypertension -Multifactorial including acute on chronic HFpEF, underlying CAD, new onset atrial flutter, COPD with ongoing tobacco use, and renal dysfunction -Titrate FiO2 as able to keep O2 sats greater than equal to 92% -Management per IM -If dyspnea persist, may benefit from right heart cath to further evaluate hemodynamics  Acute on chronic HFpEF -Volume continues to improve on Lasix drip -Continues to remain  volume up -Not currently on spironolactone or SGLT2 inhibitor secondary to acute on CKD stage IV -Echocardiogram completed with LVEF of 60-65% - -2.9L output in the lat 24 hours  CAD status post CABG with elevated high-sensitivity troponin -Remains chest pain-free -No ischemic changes noted on EKG -Mildly elevated high-sensitivity troponin -Not consistent with ACS -On Eliquis 5 mg twice daily in place of aspirin  Acute on CKD stage IV -Serum creatinine 3.25 -Monitor  urine output -Remains on Lasix drip -Monitor/trend/replete electrolytes as needed -Daily BMP monitor and trend kidney function -Avoid nephrotoxic agents when possible -Management per nephrology  Hyperkalemia -Potassium 4.1 -Daily BMP -Monitor/trend/replete electrolytes as needed  Hypertension -Blood pressure 120/64 -Continue metoprolol 25 mg twice daily -Remains on Lasix drip -Vital signs per unit protocol  Hyperlipidemia -LDL 63 at goal -Continue PTA Lipitor  Tobacco use -Cessation is recommended     For questions or updates, please contact [place] Please consult [email or web address] for contact info under        Signed, Keryl Gholson, NP  04/04/2022, 10:22 AM

## 2022-04-04 NOTE — Care Management Important Message (Signed)
Important Message  Patient Details  Name: Joshua Roy MRN: [id number] Date of Birth: 26-Oct-1950   Medicare Important Message Given:  No  Patient discharged prior to arrival to room to deliver concurrent Medicare IM.   Dannette Barbara 04/04/2022, 4:04 PM

## 2022-04-04 NOTE — Progress Notes (Signed)
Pt AVS reviewed and pt verbalized understanding of all dc teaching and instructions. PT has all his belongings in his possesion. Pt going home with family friend as transportation.

## 2022-04-07 ENCOUNTER — Other Ambulatory Visit: Payer: Self-pay | Admitting: *Deleted

## 2022-04-07 ENCOUNTER — Encounter: Payer: Self-pay | Admitting: *Deleted

## 2022-04-07 ENCOUNTER — Telehealth: Payer: Self-pay | Admitting: *Deleted

## 2022-04-07 DIAGNOSIS — D509 Iron deficiency anemia, unspecified: Secondary | ICD-10-CM

## 2022-04-07 NOTE — Progress Notes (Unsigned)
Cardiology Office Note  Date:  04/08/2022   ID:  Joshua Roy, DOB 1950/11/19, MRN [id number]  PCP:  [name], MD   Chief Complaint  Patient presents with   [facility] follow up     Patient c/o shortness of breath with some LE edema. Medications reviewed by the patient verbally.     HPI:  Joshua Roy is 71 year old gentleman with a history of  coronary artery disease,  occluded mid to distal RCA  by cardiac catheterization in 2001,  residual 60% proximal to mid LAD disease at that time, 30% diagonal disease, 10% left main disease, 20% proximal left circumflex disease.   bypass surgery March 2021,  Active smoker hyperlipidemia,  obesity and diabetes  PAD, carotid stenosis, s/p carotid endarterectomy March 2021, postoperative atrial fibrillation Admission to the hospital for CHF August 2023 Diabetes Chronic renal sufficiency, followed by nephrology, creatinine greater than 3 He presents today for follow-up of his coronary artery disease, CHF, hx of CABG  Last seen by myself in clinic August 2022  Recent hospital admission for acute on chronic diastolic CHF gained approximately 20 pounds.  Reports associated symptoms of bilateral lower extremity swelling and orthopnea.  1 week of increased his Lasix from 40 mg twice daily to 80 mg twice daily without significant change in his symptoms.  Left ventricular ejection fraction, by estimation, is 60 to 65%.  In the hospital noted to be in atrial flutter, Plavix held, started on Eliquis echocardiogram 04/01/2022-ejection fraction 60 to 65%.  Severe LVH.  Right ventricular systolic function is mildly reduced.  Severely elevated pulmonary artery systolic pressure.  CKD stage IV-creatinine 3.25 from 3.51 from 3.62 from 3.94 on Lasix drip.  His creatinine was 3.25 on discharge.    Amaryl and metformin was stopped on discharge started glipizide at a lower dose 2.5 mg daily   Discharged on Lasix 80 in the morning 40 in the afternoon  with plan to consider also adding metolazone On metoprolol 25 twice daily, Eliquis 5 twice daily Losartan held at discharge  Still smoking  A1c 7 Total cholesterol 124 LDL 63 in 2022 On today's visit has not started Eliquis Has not started farxiga  Continued leg swelling, shortness of breath on exertion  Wife does meds, she is on the video chat during today's visit  long hospital course April 2021 Multiorgan failure   EKG personally reviewed by myself on todays visit Atrial fibrillation rate  84 no significant ST-T wave changes  Past medical history reviewed  May 2021, CHF exacerbation, sent to the hospital multiorgan failure COPD, anemia, acute on chronic kidney failure, difficulty with diuresis in the setting of CHF,  Management limited by his anxiety and desire to go home Chronic hypoxia saturations in the 80s on 10 L oxygen throughout his hospital course -Also treated for bronchitis Poorly controlled diabetes   PMH:   has a past medical history of (HFpEF) heart failure with preserved ejection fraction ([place]), ACE-inhibitor cough, Anxiety, Bronchitis (06/27/2016), Carotid arterial disease ([place]), CKD (chronic kidney disease), stage III ([place]), COPD (chronic obstructive pulmonary disease) ([place]), Coronary artery disease, Diabetes mellitus type II, Hyperlipidemia, Hypertension, MI (myocardial infarction) ([place]) (age 65), OA (osteoarthritis), PAF (paroxysmal atrial fibrillation) ([place]), Pneumonia, PSVT (paroxysmal supraventricular tachycardia) ([place]), and Tobacco abuse.  PSH:    Past Surgical History:  Procedure Laterality Date   CARDIAC CATHETERIZATION  05/06/2000   @ [place]   COLONOSCOPY WITH PROPOFOL N/A 04/20/2018   Procedure: COLONOSCOPY WITH PROPOFOL;  Surgeon: [name], MD;  Location: [facility] ENDOSCOPY;  Service: Endoscopy;  Laterality: N/A;   CORONARY ARTERY BYPASS GRAFT N/A 10/07/2019   Procedure: CORONARY ARTERY BYPASS GRAFTING (CABG) using LIMA to LAD; Endoscopic harvesting  of left greater saphenous vein: SVG to RAMUS; SVG to OM1; SVG to PDA.;  Surgeon: Gaye Pollack, MD;  Location: [facility] OR;  Service: Open Heart Surgery;  Laterality: N/A;   ENDARTERECTOMY Right 10/07/2019   Procedure: ENDARTERECTOMY CAROTID;  Surgeon: Rosetta Posner, MD;  Location: [facility] OR;  Service: Vascular;  Laterality: Right;   ENDOVEIN HARVEST OF GREATER SAPHENOUS VEIN Left 10/07/2019   Procedure: [place] Of Greater Saphenous Vein;  Surgeon: Gaye Pollack, MD;  Location[facility] OR;  Service: Open Heart Surgery;  Laterality: Left;   ESOPHAGOGASTRODUODENOSCOPY ENDOSCOPY     KNEE ARTHROSCOPY  07/25/04   left   [place]   right, open surgery- repair   RIGHT/LEFT HEART CATH AND CORONARY ANGIOGRAPHY Bilateral 09/08/2019   Procedure: RIGHT/LEFT HEART CATH AND CORONARY ANGIOGRAPHY;  Surgeon: Minna Merritts, MD;  Location: [place] CV LAB;  Service: Cardiovascular;  Laterality: Bilateral;   TEE WITHOUT CARDIOVERSION N/A 10/07/2019   Procedure: TRANSESOPHAGEAL ECHOCARDIOGRAM (TEE);  Surgeon: Gaye Pollack, MD;  Location: [place];  Service: Open Heart Surgery;  Laterality: N/A;   TOTAL KNEE ARTHROPLASTY Left 07/14/2016   Procedure: LEFT TOTAL KNEE ARTHROPLASTY;  Surgeon: Gaynelle Arabian, MD;  Location: [facility] ORS;  Service: Orthopedics;  Laterality: Left;   TOTAL KNEE ARTHROPLASTY Right 07/13/2017   Procedure: RIGHT TOTAL KNEE ARTHROPLASTY;  Surgeon: Gaynelle Arabian, MD;  Location: [facility] ORS;  Service: Orthopedics;  Laterality: Right;    Current Outpatient Medications  Medication Sig Dispense Refill   ALPRAZolam (XANAX) 0.5 MG tablet Take 1 tablet (0.5 mg total) by mouth at bedtime as needed for anxiety. 30 tablet 0   apixaban (ELIQUIS) 5 MG TABS tablet Take 1 tablet (5 mg total) by mouth 2 (two) times daily. 60 tablet 1   atorvastatin (LIPITOR) 80 MG tablet TAKE 1 TABLET BY MOUTH EVERY DAY 90 tablet 1   furosemide (LASIX) 40 MG tablet Take 2 tablets in the morning and 1 tablet at night that  is 80 mg in the morning and 40 mg at night 60 tablet 11   glipiZIDE (GLUCOTROL) 5 MG tablet Take 0.5 tablets (2.5 mg total) by mouth daily before breakfast. 30 tablet 2   glucose blood (TRUE METRIX BLOOD GLUCOSE TEST) test strip 1 each by Other route daily. Use as instructed 100 each 12   iron polysaccharides (NIFEREX) 150 MG capsule Take 1 capsule (150 mg total) by mouth daily. 90 capsule 1   metoprolol tartrate (LOPRESSOR) 25 MG tablet Take 1 tablet (25 mg total) by mouth 2 (two) times daily. 180 tablet 3   nitroGLYCERIN (NITROSTAT) 0.4 MG SL tablet Place 0.4 mg under the tongue every 5 (five) minutes as needed for chest pain.     pantoprazole (PROTONIX) 40 MG tablet TAKE 1 TABLET BY MOUTH DAILY BEFORE BREAKFAST 90 tablet 3   traZODone (DESYREL) 50 MG tablet TAKE 1 TABLET BY MOUTH EVERY DAY 90 tablet 1   No current facility-administered medications for this visit.     Allergies:   Ramipril and Pseudoephedrine-guaifenesin er   Social History:  The patient  reports that he has been smoking cigarettes. He has a 52.00 pack-year smoking history. He has never used smokeless tobacco. He reports current alcohol use. He reports that he does not use drugs.   Family  History:   family history includes Alcohol abuse in his father; Cancer in his brother; Colon cancer in his brother; Diabetes in his father and sister; Heart disease in his father and sister; Hypertension in his father.    Review of Systems: Review of Systems  Constitutional: Negative.   HENT: Negative.    Respiratory: Negative.    Cardiovascular: Negative.   Gastrointestinal: Negative.   Musculoskeletal: Negative.   Neurological: Negative.   Psychiatric/Behavioral: Negative.    All other systems reviewed and are negative.   PHYSICAL EXAM: VS:  BP (!) 106/56 (BP Location: Left Arm, Patient Position: Sitting, Cuff Size: Normal)   Pulse 84   Ht '5\' 10"'$  (1.778 m)   Wt 248 lb 8 oz (112.7 kg)   SpO2 98%   BMI 35.66 kg/m  , BMI  Body mass index is 35.66 kg/m. Constitutional:  oriented to person, place, and time. No distress.  HENT:  Head: Grossly normal Eyes:  no discharge. No scleral icterus.  Neck: No JVD, no carotid bruits  Cardiovascular: Regular rate and rhythm, no murmurs appreciated 1+ pitting lower extremity edema Pulmonary/Chest: Mildly decreased breath sounds, scattered Rales  Abdominal: Soft.  no distension.  no tenderness.  Musculoskeletal: Normal range of motion Neurological:  normal muscle tone. Coordination normal. No atrophy Skin: Skin warm and dry Psychiatric: normal affect, pleasant   Recent Labs: 08/12/2021: Magnesium 1.9 03/18/2022: ALT 16 04/01/2022: B Natriuretic Peptide 267.2; TSH 2.088 04/02/2022: Hemoglobin 11.4; Platelets 234 04/04/2022: BUN 57; Creatinine, Ser 3.25; Potassium 4.1; Sodium 139    Lipid Panel Lab Results  Component Value Date   CHOL 124 06/13/2021   HDL 39 (L) 06/13/2021   LDLCALC 63 06/13/2021   TRIG 138 06/13/2021    Wt Readings from Last 3 Encounters:  04/08/22 248 lb 8 oz (112.7 kg)  04/04/22 255 lb 1.2 oz (115.7 kg)  03/24/22 266 lb (120.7 kg)     ASSESSMENT AND PLAN:  Atrial flutter/atrial fibrillation Has not started his eliquis,  rate controlled on metoprolol Recommend he start the Eliquis and in 1 month reevaluation, if he remains in atrial fibrillation/flutter would go ahead with cardioversion High risk of recurrent arrhythmia  Coronary artery disease of native artery of native heart with stable angina pectoris ([place]) bypass surgery 2021 Has recovered well, 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 No changes made to the medications.  DISORDER, TOBACCO USE We have encouraged him to continue to work on weaning his cigarettes and smoking cessation. He will continue to work on this and does not want any assistance with chantix.     Centrilobular emphysema (HCC)  COPD,  Smoking cessation recommended  Morbid obesity ([place]) We have encouraged continued exercise, careful diet management in an effort to lose weight.  Acute on chronic renal failure Getting closer to dialysis Followed by nephrology Continue Lasix 80 in the morning 40 in the afternoon He was previously recommended to start [facility] by nephrology.  We have suggested he could start a medication   Total encounter time more than 30 minutes  Greater than 50% was spent in counseling and coordination of care with  the patient   No orders of the defined types were placed in this encounter.    Signed, Esmond Plants, M.D., Ph.D. 04/08/2022  [place], [place]

## 2022-04-07 NOTE — Telephone Encounter (Signed)
Patient called asking if he needs more infusions citing that the missed appts in computer were while he was in the hospital and he got iron in hospital. He wanted to know when his next appointment is to be, I informed him of the October appts. He then told me that his brother had had a problem similar and he had to get B12 injection and asked if he needs that also fo rhis energy levels. I informed him that I do not see that a B 12 level was drawn nor is it ordered for when he returns. He is asking if one could be drawn. Please advise

## 2022-04-07 NOTE — Telephone Encounter (Signed)
Patient requests My Chart message be sent regarding this request

## 2022-04-07 NOTE — Patient Outreach (Signed)
  Care Coordination [facility] Note Transition Care Management Follow-up Telephone Call Date of discharge and from where: 04/04/22 [facility] How have you been since you were released from the hospital? Patient states he is better but he continues to feel tired and weak. Any questions or concerns? Yes  Items Reviewed: Did the pt receive and understand the discharge instructions provided? Yes  Medications obtained and verified? Yes  Other? No  Any new allergies since your discharge? No  Dietary orders reviewed? Yes Do you have support at home? Yes   Home Care and Equipment/Supplies: Were home health services ordered? yes If so, what is the name of the agency? [facility]  Has the agency set up a time to come to the patient's home? no Were any new equipment or medical supplies ordered?  No What is the name of the medical supply agency? N/A Were you able to get the supplies/equipment? not applicable Do you have any questions related to the use of the equipment or supplies? No  Functional Questionnaire: (I = Independent and D = Dependent) ADLs: I  Bathing/Dressing- I  Meal Prep- D  Eating- I  Maintaining continence- I  Transferring/Ambulation- I  Managing Meds- I  Follow up appointments reviewed:  PCP Hospital f/u appt confirmed? No   Specialist Hospital f/u appt confirmed? Yes  Scheduled to see Dr.Gollan on 04/08/22 @ 1340. Are transportation arrangements needed? No  If their condition worsens, is the pt aware to call PCP or go to the Emergency Dept.? Yes Was the patient provided with contact information for the PCP's office or ED? Yes Was to pt encouraged to call back with questions or concerns? Yes  SDOH assessments and interventions completed:   No  Care Coordination Interventions Activated:  No   Care Coordination Interventions:   N/A     Encounter Outcome:  Pt. Visit Completed    Emelia Loron RN, BSN [place] [phone] [email or web address]

## 2022-04-08 ENCOUNTER — Encounter: Payer: Self-pay | Admitting: Cardiovascular Disease

## 2022-04-08 ENCOUNTER — Ambulatory Visit: Payer: Medicare Other | Attending: Cardiovascular Disease | Admitting: Cardiovascular Disease

## 2022-04-08 VITALS — BP 106/56 | HR 84 | Ht 70.0 in | Wt 248.5 lb

## 2022-04-08 DIAGNOSIS — I2510 Atherosclerotic heart disease of native coronary artery without angina pectoris: Secondary | ICD-10-CM

## 2022-04-08 DIAGNOSIS — I10 Essential (primary) hypertension: Secondary | ICD-10-CM | POA: Insufficient documentation

## 2022-04-08 DIAGNOSIS — E1142 Type 2 diabetes mellitus with diabetic polyneuropathy: Secondary | ICD-10-CM | POA: Diagnosis not present

## 2022-04-08 DIAGNOSIS — J449 Chronic obstructive pulmonary disease, unspecified: Secondary | ICD-10-CM | POA: Insufficient documentation

## 2022-04-08 DIAGNOSIS — I252 Old myocardial infarction: Secondary | ICD-10-CM | POA: Diagnosis not present

## 2022-04-08 DIAGNOSIS — I4819 Other persistent atrial fibrillation: Secondary | ICD-10-CM | POA: Diagnosis not present

## 2022-04-08 DIAGNOSIS — F419 Anxiety disorder, unspecified: Secondary | ICD-10-CM | POA: Diagnosis not present

## 2022-04-08 DIAGNOSIS — Z7901 Long term (current) use of anticoagulants: Secondary | ICD-10-CM | POA: Diagnosis not present

## 2022-04-08 DIAGNOSIS — F17210 Nicotine dependence, cigarettes, uncomplicated: Secondary | ICD-10-CM | POA: Diagnosis not present

## 2022-04-08 DIAGNOSIS — I5033 Acute on chronic diastolic (congestive) heart failure: Secondary | ICD-10-CM

## 2022-04-08 DIAGNOSIS — N183 Chronic kidney disease, stage 3 unspecified: Secondary | ICD-10-CM | POA: Diagnosis not present

## 2022-04-08 DIAGNOSIS — D509 Iron deficiency anemia, unspecified: Secondary | ICD-10-CM | POA: Diagnosis not present

## 2022-04-08 DIAGNOSIS — E1122 Type 2 diabetes mellitus with diabetic chronic kidney disease: Secondary | ICD-10-CM | POA: Diagnosis not present

## 2022-04-08 DIAGNOSIS — I6523 Occlusion and stenosis of bilateral carotid arteries: Secondary | ICD-10-CM | POA: Diagnosis not present

## 2022-04-08 DIAGNOSIS — E782 Mixed hyperlipidemia: Secondary | ICD-10-CM

## 2022-04-08 DIAGNOSIS — I480 Paroxysmal atrial fibrillation: Secondary | ICD-10-CM | POA: Diagnosis not present

## 2022-04-08 DIAGNOSIS — I130 Hypertensive heart and chronic kidney disease with heart failure and stage 1 through stage 4 chronic kidney disease, or unspecified chronic kidney disease: Secondary | ICD-10-CM | POA: Diagnosis not present

## 2022-04-08 DIAGNOSIS — Z9981 Dependence on supplemental oxygen: Secondary | ICD-10-CM | POA: Diagnosis not present

## 2022-04-08 DIAGNOSIS — J9601 Acute respiratory failure with hypoxia: Secondary | ICD-10-CM | POA: Diagnosis not present

## 2022-04-08 DIAGNOSIS — K219 Gastro-esophageal reflux disease without esophagitis: Secondary | ICD-10-CM | POA: Diagnosis not present

## 2022-04-08 DIAGNOSIS — Z9181 History of falling: Secondary | ICD-10-CM | POA: Diagnosis not present

## 2022-04-08 DIAGNOSIS — R600 Localized edema: Secondary | ICD-10-CM | POA: Insufficient documentation

## 2022-04-08 DIAGNOSIS — N184 Chronic kidney disease, stage 4 (severe): Secondary | ICD-10-CM | POA: Diagnosis not present

## 2022-04-08 DIAGNOSIS — Z7984 Long term (current) use of oral hypoglycemic drugs: Secondary | ICD-10-CM | POA: Diagnosis not present

## 2022-04-08 DIAGNOSIS — M1710 Unilateral primary osteoarthritis, unspecified knee: Secondary | ICD-10-CM | POA: Diagnosis not present

## 2022-04-08 DIAGNOSIS — G4700 Insomnia, unspecified: Secondary | ICD-10-CM | POA: Diagnosis not present

## 2022-04-08 DIAGNOSIS — E785 Hyperlipidemia, unspecified: Secondary | ICD-10-CM | POA: Diagnosis not present

## 2022-04-08 MED ORDER — APIXABAN 5 MG PO TABS: 5.0000 mg | ORAL_TABLET | Freq: Two times a day (BID) | ORAL | 6 refills | Status: DC

## 2022-04-08 NOTE — Patient Instructions (Addendum)
Medication Instructions:   START Eliquis 5 mg twice a day -- coupon card was provided to you!  If you need a refill on your cardiac medications before your next appointment, please call your pharmacy.   Lab work:  No new labs needed  Testing/Procedures:  No new testing needed  Follow-Up: At [facility], you and your health needs are our priority.  As part of our continuing mission to provide you with exceptional heart care, we have created designated Provider Care Teams.  These Care Teams include your primary Cardiologist (physician) and Advanced Practice Providers (APPs -  Physician Assistants and Nurse Practitioners) who all work together to provide you with the care you need, when you need it.  You will need a follow up appointment in 1 month  Providers on your designated Care Team:   Murray Hodgkins, NP Christell Faith, PA-C Cadence Kathlen Mody, [place]  COVID-19 Vaccine Information can be found at: [email or web address] For questions related to vaccine distribution or appointments, please email [email or web address] or call [phone].

## 2022-04-09 ENCOUNTER — Encounter: Payer: Self-pay | Admitting: Internal Medicine

## 2022-04-09 ENCOUNTER — Ambulatory Visit (INDEPENDENT_AMBULATORY_CARE_PROVIDER_SITE_OTHER): Payer: Medicare Other | Admitting: Internal Medicine

## 2022-04-09 ENCOUNTER — Other Ambulatory Visit
Admission: RE | Admit: 2022-04-09 | Discharge: 2022-04-09 | Disposition: A | Discharge status: Home / Self Care | Payer: Medicare Other | Attending: Internal Medicine | Admitting: Internal Medicine

## 2022-04-09 VITALS — BP 127/74 | HR 74 | Ht 70.0 in | Wt 249.1 lb

## 2022-04-09 DIAGNOSIS — I483 Typical atrial flutter: Secondary | ICD-10-CM | POA: Diagnosis not present

## 2022-04-09 DIAGNOSIS — I2510 Atherosclerotic heart disease of native coronary artery without angina pectoris: Secondary | ICD-10-CM | POA: Diagnosis not present

## 2022-04-09 DIAGNOSIS — I10 Essential (primary) hypertension: Secondary | ICD-10-CM | POA: Diagnosis not present

## 2022-04-09 DIAGNOSIS — N183 Chronic kidney disease, stage 3 unspecified: Secondary | ICD-10-CM | POA: Insufficient documentation

## 2022-04-09 DIAGNOSIS — M25471 Effusion, right ankle: Secondary | ICD-10-CM

## 2022-04-09 DIAGNOSIS — E1122 Type 2 diabetes mellitus with diabetic chronic kidney disease: Secondary | ICD-10-CM | POA: Diagnosis not present

## 2022-04-09 DIAGNOSIS — Z720 Tobacco use: Secondary | ICD-10-CM | POA: Diagnosis not present

## 2022-04-09 DIAGNOSIS — D509 Iron deficiency anemia, unspecified: Secondary | ICD-10-CM | POA: Diagnosis not present

## 2022-04-09 DIAGNOSIS — M25472 Effusion, left ankle: Secondary | ICD-10-CM

## 2022-04-09 DIAGNOSIS — E1159 Type 2 diabetes mellitus with other circulatory complications: Secondary | ICD-10-CM

## 2022-04-09 LAB — CBC WITH DIFFERENTIAL/PLATELET
Abs Immature Granulocytes: 0.02 10*3/uL (ref 0.00–0.07)
Basophils Absolute: 0.1 10*3/uL (ref 0.0–0.1)
Basophils Relative: 1 %
Eosinophils Absolute: 0 10*3/uL (ref 0.0–0.5)
Eosinophils Relative: 0 %
HCT: 41.7 % (ref 39.0–52.0)
Hemoglobin: 12.5 g/dL — ABNORMAL LOW (ref 13.0–17.0)
Immature Granulocytes: 0 %
Lymphocytes Relative: 26 %
Lymphs Abs: 1.8 10*3/uL (ref 0.7–4.0)
MCH: 23.1 pg — ABNORMAL LOW (ref 26.0–34.0)
MCHC: 30 g/dL (ref 30.0–36.0)
MCV: 77.2 fL — ABNORMAL LOW (ref 80.0–100.0)
Monocytes Absolute: 0.7 10*3/uL (ref 0.1–1.0)
Monocytes Relative: 10 %
Neutro Abs: 4.4 10*3/uL (ref 1.7–7.7)
Neutrophils Relative %: 63 %
Platelets: 271 10*3/uL (ref 150–400)
RBC: 5.4 MIL/uL (ref 4.22–5.81)
RDW: 22.7 % — ABNORMAL HIGH (ref 11.5–15.5)
WBC: 7 10*3/uL (ref 4.0–10.5)
nRBC: 0 % (ref 0.0–0.2)

## 2022-04-09 LAB — GLUCOSE, POCT (MANUAL RESULT ENTRY): POC Glucose: 132 mg/dl — AB (ref 70–99)

## 2022-04-09 LAB — IRON AND TIBC
Iron: 59 ug/dL (ref 45–182)
Saturation Ratios: 10 % — ABNORMAL LOW (ref 17.9–39.5)
TIBC: 594 ug/dL — ABNORMAL HIGH (ref 250–450)
UIBC: 535 ug/dL

## 2022-04-09 LAB — FERRITIN: Ferritin: 272 ng/mL (ref 24–336)

## 2022-04-09 LAB — SAMPLE TO BLOOD BANK

## 2022-04-09 LAB — VITAMIN B12: Vitamin B-12: 427 pg/mL (ref 180–914)

## 2022-04-09 NOTE — Progress Notes (Signed)
Established Patient Office Visit  Subjective:  Patient ID: Joshua Roy, male    DOB: Dec 14, 1950  Age: 71 y.o. MRN: [id number]  CC:  Chief Complaint  Patient presents with   Hospitalization Follow-up    Patient was admitted on 04/01/22 and discharged on 04/04/22.     HPI  Joshua Roy presents for check up  Past Medical History:  Diagnosis Date   (HFpEF) heart failure with preserved ejection fraction ([place])    a. 07/2019 Echo: EF 60-65%. Mod LVH. Gr1 DD. No rwma. Nl RV size/fxn. Mildly dil LA; b. 11/2019 Echo: EF 55-60%, no rwma, mild LVH. Nl RV size/fxn. Mod dil LA.   ACE-inhibitor cough    Anxiety    Bronchitis 06/27/2016   Carotid arterial disease ([place])    a. 09/2019 Carotid U/S: RICA 18-29%, LICA 9-37%; b. 07/6965 R CEA.   CKD (chronic kidney disease), stage III (HCC)    COPD (chronic obstructive pulmonary disease) (HCC)    cath, mild inf. hypokinesis EF 49%, 100% ROA   Coronary artery disease    a. 2001 Cath: LM 10, LAD 60p/m, D1 30, LCX 20p, RCA 120md-->Med Rx; b. 05/2017 Ex Mv: Ex time 5:46, antlat twi @ rest, fixed infarct w/ mild peri-infarct ischemia. EF 38%; c. 08/2019 Cath: LM 40ost, 70d, LAD 730mLCX 801mCA 80-90p, 100m44m 09/2019 CABG x4: LIMA->LAD, VG->RI, VG->OM, VG->RPDA.   Diabetes mellitus type II    Hyperlipidemia    Hypertension    [facility]  40[facility] (oste[facility])    knee OA, injected 2012 by ortho   PAF (paroxysmal atrial fibrillation) (HCC)[place] a. 09/2019 post-op CABG-->Amio.   Pneumonia    PSVT (paroxysmal supraventricular tachycardia) (HCC)[place] a. 08/2019 Zio: 189 brief runs of SVT (fastest 174 x 6 beats, longest 15 beats @ 107).   Tobacco abuse     Past Surgical History:  Procedure Laterality Date   CARDIAC CATHETERIZATION  [facility]/06/2000   [facility]A 04/20/2018   Procedure: COLONOSCOPY WITH PROPOFOL;  Surgeon: WohlLucilla Lame;  [facility]  Service: Endoscopy;   Laterality: N/A;   CORONARY ARTERY BYPASS GRAFT N/A 10/07/2019   Procedure: CORONARY ARTERY BYPASS GRAFTING (CABG) using LIMA to LAD; Endoscopic harvesting of left greater saphenous vein: SVG to RAMUS; SVG to OM1; SVG to PDA.;  Surgeon: BartGaye Pollack;  [facility]  Service: Open Heart Surgery;  Laterality: N/A;   ENDARTERECTOMY Right 10/07/2019   Procedure: ENDARTERECTOMY CAROTID;  Surgeon: EarlRosetta Posner;  [facility]  Service: Vascular;  Laterality: Right;   ENDOVEIN HARVEST OF GREATER SAPHENOUS VEIN Left 10/07/2019   Procedure: EndoCharleston RopesGreater Saphenous Vein;  Surgeon: BartGaye Pollack;  [facility]  Service: Open Heart Surgery;  Laterality: Left;   ESOPHAGOGASTRODUODENOSCOPY ENDOSCOPY     KNEE ARTHROSCOPY  07/25/04   left   [place], open surgery- repair   RIGHT/LEFT HEART CATH AND CORONARY ANGIOGRAPHY Bilateral 09/08/2019   Procedure: RIGHT/LEFT HEART CATH AND CORONARY ANGIOGRAPHY;  Surgeon: GollMinna Merritts;  [facility];  Service: Cardiovascular;  Laterality: Bilateral;   TEE WITHOUT CARDIOVERSION N/A 10/07/2019   Procedure: TRANSESOPHAGEAL ECHOCARDIOGRAM (TEE);  Surgeon: BartGaye Pollack;  [facility]: Open Heart Surgery;  Laterality: N/A;   TOTAL KNEE ARTHROPLASTY Left 07/14/2016   Procedure: LEFT TOTAL  KNEE ARTHROPLASTY;  Surgeon: Gaynelle Arabian, MD;  Location: [facility];  Service: Orthopedics;  Laterality: Left;   TOTAL KNEE ARTHROPLASTY Right 07/13/2017   Procedure: RIGHT TOTAL KNEE ARTHROPLASTY;  Surgeon: Gaynelle Arabian, MD;  Location: [facility];  Service: Orthopedics;  Laterality: Right;    Family History  Problem Relation Age of Onset   Heart disease Father        CAD, MI x 3   Hypertension Father    Alcohol abuse Father    Diabetes Father    Diabetes Sister    Cancer Brother        lymphoma, in remission   Colon cancer Brother    Heart disease Sister        CABG x 3   Prostate  cancer Neg Hx     Social History   Socioeconomic History   Marital status: Married    Spouse name: Janie   Number of children: 3   Years of education: Not on file   Highest education level: Not on file  Occupational History   Occupation: Engineer, manufacturing, Estate agent  Tobacco Use   Smoking status: Every Day    Packs/day: 1.00    Years: 52.00    Total pack years: 52.00    Types: Cigarettes   Smokeless tobacco: Never   Tobacco comments:    per patient wears a patch and has cut down on cigarettes/day (10/05/2019)  Vaping Use   Vaping Use: Never used  Substance and Sexual Activity   Alcohol use: Yes    Alcohol/week: 0.0 standard drinks of alcohol    Comment: 5-6 cocktails, 1 times a week   Drug use: No   Sexual activity: Not on file  Other Topics Concern   Not on file  Social History Narrative   From [place].  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   Social Determinants of Health   Financial Resource Strain: Low Risk  (06/14/2021)   Overall Financial Resource Strain (CARDIA)    Difficulty of Paying Living Expenses: Not hard at all  Food Insecurity: No Food Insecurity (04/27/2020)   Hunger Vital Sign    Worried About Running Out of Food in the Last Year: Never true    Ran Out of Food in the Last Year: Never true  Transportation Needs: No Transportation Needs (06/14/2021)   PRAPARE - Hydrologist (Medical): No    Lack of Transportation (Non-Medical): No  Physical Activity: Insufficiently Active (06/14/2021)   Exercise Vital Sign    Days of Exercise per Week: 2 days    Minutes of Exercise per Session: 20 min  Stress: No Stress Concern Present (06/14/2021)   [place]    Feeling of Stress : Not at all  Social Connections: Moderately Isolated (06/14/2021)   Social Connection and Isolation Panel [NHANES]    Frequency of Communication with Friends and Family: More than  three times a week    Frequency of Social Gatherings with Friends and Family: More than three times a week    Attends Religious Services: Never    Marine scientist or Organizations: No    Attends Archivist Meetings: Never    Marital Status: Married  Human resources officer Violence: Not At Risk (06/14/2021)   Humiliation, Afraid, Rape, and Kick questionnaire    Fear of Current or Ex-Partner: No    Emotionally Abused: No    Physically Abused: No  Sexually Abused: No     Current Outpatient Medications:    ALPRAZolam (XANAX) 0.5 MG tablet, Take 1 tablet (0.5 mg total) by mouth at bedtime as needed for anxiety., Disp: 30 tablet, Rfl: 0   apixaban (ELIQUIS) 5 MG TABS tablet, Take 1 tablet (5 mg total) by mouth 2 (two) times daily., Disp: 60 tablet, Rfl: 6   atorvastatin (LIPITOR) 80 MG tablet, TAKE 1 TABLET BY MOUTH EVERY DAY, Disp: 90 tablet, Rfl: 1   furosemide (LASIX) 40 MG tablet, Take 2 tablets in the morning and 1 tablet at night that is 80 mg in the morning and 40 mg at night, Disp: 60 tablet, Rfl: 11   glipiZIDE (GLUCOTROL) 5 MG tablet, Take 0.5 tablets (2.5 mg total) by mouth daily before breakfast., Disp: 30 tablet, Rfl: 2   glucose blood (TRUE METRIX BLOOD GLUCOSE TEST) test strip, 1 each by Other route daily. Use as instructed, Disp: 100 each, Rfl: 12   iron polysaccharides (NIFEREX) 150 MG capsule, Take 1 capsule (150 mg total) by mouth daily., Disp: 90 capsule, Rfl: 1   metoprolol tartrate (LOPRESSOR) 25 MG tablet, Take 1 tablet (25 mg total) by mouth 2 (two) times daily., Disp: 180 tablet, Rfl: 3   nitroGLYCERIN (NITROSTAT) 0.4 MG SL tablet, Place 0.4 mg under the tongue every 5 (five) minutes as needed for chest pain., Disp: , Rfl:    pantoprazole (PROTONIX) 40 MG tablet, TAKE 1 TABLET BY MOUTH DAILY BEFORE BREAKFAST, Disp: 90 tablet, Rfl: 3   traZODone (DESYREL) 50 MG tablet, TAKE 1 TABLET BY MOUTH EVERY DAY, Disp: 90 tablet, Rfl: 1   Allergies  Allergen Reactions    Ramipril Cough and Other (See Comments)    REACTION: Cough   Pseudoephedrine-Guaifenesin Er Other (See Comments) and Hypertension    Elevated BP, insomnia Elevated BP, insomnia    ROS Review of Systems  Constitutional: Negative.   HENT: Negative.  Negative for nosebleeds.   Eyes: Negative.   Respiratory:  Positive for shortness of breath. Negative for cough, choking and wheezing.   Cardiovascular:  Positive for leg swelling. Negative for chest pain.  Gastrointestinal: Negative.  Negative for abdominal distention.  Endocrine: Negative.   Genitourinary: Negative.   Musculoskeletal: Negative.   Skin: Negative.   Allergic/Immunologic: Negative.   Neurological: Negative.  Negative for dizziness, light-headedness and headaches.  Hematological: Negative.   Psychiatric/Behavioral: Negative.  Negative for agitation.   All other systems reviewed and are negative.     Objective:    Physical Exam Vitals reviewed.  Constitutional:      Appearance: Normal appearance.  HENT:     Mouth/Throat:     Mouth: Mucous membranes are moist.  Eyes:     Pupils: Pupils are equal, round, and reactive to light.  Neck:     Vascular: No carotid bruit.  Cardiovascular:     Rate and Rhythm: Normal rate and regular rhythm.     Pulses: Normal pulses.     Heart sounds: Normal heart sounds.  Pulmonary:     Effort: Pulmonary effort is normal.     Breath sounds: Normal breath sounds.  Abdominal:     General: Bowel sounds are normal.     Palpations: Abdomen is soft. There is no hepatomegaly, splenomegaly or mass.     Tenderness: There is no abdominal tenderness.     Hernia: No hernia is present.  Musculoskeletal:     Cervical back: Neck supple.     Right lower leg: No edema.  Left lower leg: No edema.     Comments: Swelling of both legs  Skin:    Findings: No rash.  Neurological:     Mental Status: He is alert and oriented to person, place, and time.     Motor: No weakness.  Psychiatric:         Mood and Affect: Mood normal.        Behavior: Behavior normal.     BP 127/74   Pulse 74   Ht _0  (1.778 m)   Wt 249 lb 1.6 oz (113 kg)   BMI 35.74 kg/m  Wt Readings from Last 3 Encounters:  04/09/22 249 lb 1.6 oz (113 kg)  04/08/22 248 lb 8 oz (112.7 kg)  04/04/22 255 lb 1.2 oz (115.7 kg)     Health Maintenance Due  Topic Date Due   COVID-19 Vaccine (1) Never done   Zoster Vaccines- Shingrix (1 of 2) Never done   OPHTHALMOLOGY EXAM  10/26/2021   FOOT EXAM  02/11/2022   INFLUENZA VACCINE  02/25/2022    There are no preventive care reminders to display for this patient.  Lab Results  Component Value Date   TSH 2.088 04/01/2022   Lab Results  Component Value Date   WBC 6.6 04/02/2022   HGB 11.4 (L) 04/02/2022   HCT 39.7 04/02/2022   MCV 77.5 (L) 04/02/2022   PLT 234 04/02/2022   Lab Results  Component Value Date   NA 139 04/04/2022   K 4.1 04/04/2022   CO2 37 (H) 04/04/2022   GLUCOSE 149 (H) 04/04/2022   BUN 57 (H) 04/04/2022   CREATININE 3.25 (H) 04/04/2022   BILITOT 0.6 03/18/2022   ALKPHOS 25 (L) 12/07/2019   AST 18 03/18/2022   ALT 16 03/18/2022   PROT 6.6 03/18/2022   ALBUMIN 3.8 03/28/2022   CALCIUM 9.2 04/04/2022   ANIONGAP 7 04/04/2022   EGFR 25 (L) 03/18/2022   GFR 65.78 11/15/2014   Lab Results  Component Value Date   CHOL 124 06/13/2021   Lab Results  Component Value Date   HDL 39 (L) 06/13/2021   Lab Results  Component Value Date   LDLCALC 63 06/13/2021   Lab Results  Component Value Date   TRIG 138 06/13/2021   Lab Results  Component Value Date   CHOLHDL 3.2 06/13/2021   Lab Results  Component Value Date   HGBA1C 7.0 (A) 12/20/2021      Assessment & Plan:   Problem List Items Addressed This Visit       Cardiovascular and Mediastinum   Essential hypertension     Patient denies any chest pain or shortness of breath there is no history of palpitation or paroxysmal nocturnal dyspnea   patient was advised to  follow low-salt low-cholesterol diet    ideally I want to keep systolic blood pressure below 130 mmHg, patient was asked to check blood pressure one times a week and give me a report on that.  Patient will be follow-up in 3 months  or earlier as needed, patient will call me back for any change in the cardiovascular symptoms Patient was advised to buy a book from local bookstore concerning blood pressure and read several chapters  every day.  This will be supplemented by some of the material we will give him from the office.  Patient should also utilize other resources like YouTube and Internet to learn more about the blood pressure and the diet.      Typical atrial  flutter ([place])    Patient is on Eliquis        Endocrine   T2DM (type 2 diabetes mellitus) ([place]) - Primary   Relevant Orders   POCT glucose (manual entry) (Completed)     Other   Tobacco abuse    Patient does not smoke anymore      Morbid obesity ([place])    - I encouraged the patient to lose weight.  - I educated them on making healthy dietary choices including eating more fruits and vegetables and less fried foods. - I encouraged the patient to exercise more, and educated on the benefits of exercise including weight loss, diabetes prevention, and hypertension prevention.   Dietary counseling with a registered dietician  Referral to a weight management support group (e.g. Weight Watchers, Overeaters Anonymous)  If your BMI is greater than 29 or you have gained more than 15 pounds you should work on weight loss.  Attend a healthy cooking class       Ankle edema, bilateral    Patient has lost 10 pounds      We will recheck the B12 level and he will come and see me to see whether he needs B12 shots No orders of the defined types were placed in this encounter.   Follow-up: No follow-ups on file.    [name], MD

## 2022-04-09 NOTE — Assessment & Plan Note (Signed)

## 2022-04-09 NOTE — Assessment & Plan Note (Signed)
Patient does not smoke anymore ?

## 2022-04-09 NOTE — Assessment & Plan Note (Signed)

## 2022-04-09 NOTE — Assessment & Plan Note (Signed)
Patient is on Eliquis

## 2022-04-09 NOTE — Assessment & Plan Note (Signed)
Patient has lost 10 pounds

## 2022-04-10 ENCOUNTER — Other Ambulatory Visit: Payer: Self-pay | Admitting: Cardiovascular Disease

## 2022-04-10 LAB — ERYTHROPOIETIN: Erythropoietin: 27.2 m[IU]/mL — ABNORMAL HIGH (ref 2.6–18.5)

## 2022-04-14 DIAGNOSIS — E1122 Type 2 diabetes mellitus with diabetic chronic kidney disease: Secondary | ICD-10-CM | POA: Diagnosis not present

## 2022-04-14 DIAGNOSIS — J9601 Acute respiratory failure with hypoxia: Secondary | ICD-10-CM | POA: Diagnosis not present

## 2022-04-14 DIAGNOSIS — J449 Chronic obstructive pulmonary disease, unspecified: Secondary | ICD-10-CM | POA: Diagnosis not present

## 2022-04-14 DIAGNOSIS — I130 Hypertensive heart and chronic kidney disease with heart failure and stage 1 through stage 4 chronic kidney disease, or unspecified chronic kidney disease: Secondary | ICD-10-CM | POA: Diagnosis not present

## 2022-04-14 DIAGNOSIS — N184 Chronic kidney disease, stage 4 (severe): Secondary | ICD-10-CM | POA: Diagnosis not present

## 2022-04-14 DIAGNOSIS — I5033 Acute on chronic diastolic (congestive) heart failure: Secondary | ICD-10-CM | POA: Diagnosis not present

## 2022-04-15 NOTE — Progress Notes (Unsigned)
   Patient ID: KHALLID PASILLAS, male    DOB: 11-06-1950, 71 y.o.   MRN: [id number]  HPI  Mr Tursi is a 71 y/o male with a history of  Echo report from 04/01/22 reviewed and showed an EF of 60-65% along with severe LVH, severely elevated PA pressure of 78.4 mmHg, trivial MR and mild/ moderate TR.   RHC/ LHC done 09/08/19 and showed: Mid LM to Dist LM lesion is 70% stenosed. Ost LM lesion is 40% stenosed. Prox LAD to Mid LAD lesion is 70% stenosed. Mid Cx lesion is 80% stenosed. Ost RCA to Prox RCA lesion is 95% stenosed. Mid RCA to Dist RCA lesion is 100% stenosed. RPAV lesion is 90% stenosed. Hemodynamic findings consistent with moderate pulmonary hypertension. LV end diastolic pressure is normal.  Admitted 04/01/22 due to progressively worsening shortness of breath, weight gain, bilateral lower extremity swelling and orthopnea. Cardiology & nephrology consults obtained. Lasix gtt needed. Eliquis started for new onset atrial flutter. Given a unit of Feraheme for anemia. PT evaluated. Discharged after 3 days.   He presents today for his initial visit with a chief complaint of   Review of Systems    Physical Exam     Assessment & Plan:  1: Chronic heart failure with preserved ejection fraction with structural changes (LVH)- - NYHA class - BNP 04/01/22 was 267.2  2: HTN with CKD- - BP - had telemedicine visit with nephrology Holley Raring) 03/25/22 - BMP 04/04/22 reviewed and showed sodium 139, potassium 4.1, creatinine 3.25 and GFR 19  3: DM- - saw PCP (Masoud) 04/09/22 - A1c 12/20/21 was 7.0%  4: Atrial flutter- - saw cardiology Rockey Situ) 04/08/22; returns 05/15/22  5: Anemia- - saw hematology Darrall Dears) 03/24/22 - to see GI Allen Norris) 08/05/22 - Hg 04/09/22 was 12.5

## 2022-04-16 ENCOUNTER — Ambulatory Visit: Payer: Medicare Other | Attending: Family | Admitting: Family

## 2022-04-16 ENCOUNTER — Encounter: Payer: Self-pay | Admitting: Pharmacist

## 2022-04-16 ENCOUNTER — Encounter: Payer: Self-pay | Admitting: Family

## 2022-04-16 VITALS — BP 133/70 | HR 74 | Resp 20 | Ht 70.0 in | Wt 241.0 lb

## 2022-04-16 DIAGNOSIS — D649 Anemia, unspecified: Secondary | ICD-10-CM | POA: Diagnosis not present

## 2022-04-16 DIAGNOSIS — E1122 Type 2 diabetes mellitus with diabetic chronic kidney disease: Secondary | ICD-10-CM | POA: Diagnosis not present

## 2022-04-16 DIAGNOSIS — D631 Anemia in chronic kidney disease: Secondary | ICD-10-CM

## 2022-04-16 DIAGNOSIS — Z7984 Long term (current) use of oral hypoglycemic drugs: Secondary | ICD-10-CM | POA: Diagnosis not present

## 2022-04-16 DIAGNOSIS — J449 Chronic obstructive pulmonary disease, unspecified: Secondary | ICD-10-CM | POA: Insufficient documentation

## 2022-04-16 DIAGNOSIS — I10 Essential (primary) hypertension: Secondary | ICD-10-CM

## 2022-04-16 DIAGNOSIS — F419 Anxiety disorder, unspecified: Secondary | ICD-10-CM | POA: Insufficient documentation

## 2022-04-16 DIAGNOSIS — I2510 Atherosclerotic heart disease of native coronary artery without angina pectoris: Secondary | ICD-10-CM | POA: Insufficient documentation

## 2022-04-16 DIAGNOSIS — I480 Paroxysmal atrial fibrillation: Secondary | ICD-10-CM | POA: Diagnosis not present

## 2022-04-16 DIAGNOSIS — N184 Chronic kidney disease, stage 4 (severe): Secondary | ICD-10-CM

## 2022-04-16 DIAGNOSIS — I4819 Other persistent atrial fibrillation: Secondary | ICD-10-CM | POA: Diagnosis not present

## 2022-04-16 DIAGNOSIS — E785 Hyperlipidemia, unspecified: Secondary | ICD-10-CM | POA: Diagnosis not present

## 2022-04-16 DIAGNOSIS — I130 Hypertensive heart and chronic kidney disease with heart failure and stage 1 through stage 4 chronic kidney disease, or unspecified chronic kidney disease: Secondary | ICD-10-CM | POA: Insufficient documentation

## 2022-04-16 DIAGNOSIS — I5032 Chronic diastolic (congestive) heart failure: Secondary | ICD-10-CM | POA: Insufficient documentation

## 2022-04-16 DIAGNOSIS — I4892 Unspecified atrial flutter: Secondary | ICD-10-CM | POA: Insufficient documentation

## 2022-04-16 NOTE — Progress Notes (Signed)
Patient ID: Joshua Roy, male   DOB: 1951/07/20, 71 y.o.   MRN: [id number]  [facility] - PHARMACIST COUNSELING NOTE  HRpEF  ACE/ARB/ARNI: N/A Beta Blocker: Metoprolol tartrate 25 mg twice daily Aldosterone Antagonist:  none Diuretic: Furosemide 40 mg ; '80mg'$  in AM in '40mg'$  in PM SGLT2i: Dapagliflozin 10 mg daily  Adherence Assessment  Do you ever forget to take your medication? '[]'$ Yes '[x]'$ No  Do you ever skip doses due to side effects? '[]'$ Yes '[x]'$ No  Do you have trouble affording your medicines? '[]'$ Yes '[x]'$ No  Are you ever unable to pick up your medication due to transportation difficulties? '[]'$ Yes '[x]'$ No  Do you ever stop taking your medications because you don't believe they are helping? '[]'$ Yes '[x]'$ No  Do you check your weight daily? '[x]'$ Yes '[]'$ No   Adherence strategy: pill box  Barriers to obtaining medications: none  Vital signs: HR 74, BP 133/70, weight (pounds) 241 lbs  ECHO: Date 04/01/2022, EF 60-65%, notes  severe LVH, severely elevated PA pressure of 78.4 mmHg, trivial MR and mild/ moderate TR.      Latest Ref Rng & Units 04/04/2022    4:23 AM 04/03/2022    5:11 AM 04/02/2022    4:18 AM  BMP  Glucose 70 - 99 mg/dL 149  143  126   BUN 8 - 23 mg/dL 57  62  63   Creatinine 0.61 - 1.24 mg/dL 3.25  3.51  3.62   Sodium 135 - 145 mmol/L 139  138  140   Potassium 3.5 - 5.1 mmol/L 4.1  4.4  4.3   Chloride 98 - 111 mmol/L 95  94  99   CO2 22 - 32 mmol/L 37  33  31   Calcium 8.9 - 10.3 mg/dL 9.2  9.6  9.4     Past Medical History:  Diagnosis Date   (HFpEF) heart failure with preserved ejection fraction ([place])    a. 07/2019 Echo: EF 60-65%. Mod LVH. Gr1 DD. No rwma. Nl RV size/fxn. Mildly dil LA; b. 11/2019 Echo: EF 55-60%, no rwma, mild LVH. Nl RV size/fxn. Mod dil LA.   ACE-inhibitor cough    Anemia    Anxiety    Arrhythmia    atrial flutter   Bronchitis 06/27/2016   Carotid arterial disease ([place])    a. 09/2019 Carotid U/S: RICA  40-98%, LICA 1-19%; b. 07/4780 R CEA.   CHF (congestive heart failure) (HCC)    CKD (chronic kidney disease), stage III (HCC)    COPD (chronic obstructive pulmonary disease) (HCC)    cath, mild inf. hypokinesis EF 49%, 100% ROA   Coronary artery disease    a. 2001 Cath: LM 10, LAD 60p/m, D1 30, LCX 20p, RCA 127md-->Med Rx; b. 05/2017 Ex Mv: Ex time 5:46, antlat twi @ rest, fixed infarct w/ mild peri-infarct ischemia. EF 38%; c. 08/2019 Cath: LM 40ost, 70d, LAD 748mLCX 8060mCA 80-90p, 100m71m 09/2019 CABG x4: LIMA->LAD, VG->RI, VG->OM, VG->RPDA.   Diabetes mellitus type II    Hyperlipidemia    Hypertension    [facility]  17A (osteoarthritis)    knee OA, injected 2012 by ortho   PAF (paroxysmal atrial fibrillation) (HCC)[place] a. 09/2019 post-op CABG-->Amio.   Pneumonia    PSVT (paroxysmal supraventricular tachycardia) (HCC)[place] a. 08/2019 Zio: 189 brief runs of SVT (fastest 174 x 6 beats, longest 15 beats @ 107).   Tobacco abuse  ASSESSMENT 71 year old male who presents to the HF clinic for initial visit. PMH includes atrial flutter, carotid disease, CAD, DM, hyperlipidemia, HTN, CKD, anemia, anxiety, COPD, OA, PAF, PSVT, tobacco use and chronic heart failure. He had few questions related to [place] cost, but able to able to afford at this time. Patient had to meet deductible prior to insurance full coverage. He denies issues with current therapy.  Medrec was completed during visit. S/sx of UTI discusses and patient instructed to report any symptoms of UTI to MD/provider due to risk with SGLT2i.   PLAN  No therapy change today Consider start GLP1 during next visit due to DM, CAD and HF   Time spent: 20 minutes  Xinyi Batton Rodriguez-Guzman PharmD, BCPS 04/16/2022 1:14 PM   Current Outpatient Medications:    ALPRAZolam (XANAX) 0.5 MG tablet, Take 1 tablet (0.5 mg total) by mouth at bedtime as needed for anxiety. (Patient not taking: Reported on 04/16/2022),  Disp: 30 tablet, Rfl: 0   apixaban (ELIQUIS) 5 MG TABS tablet, Take 1 tablet (5 mg total) by mouth 2 (two) times daily., Disp: 60 tablet, Rfl: 6   atorvastatin (LIPITOR) 80 MG tablet, TAKE 1 TABLET BY MOUTH EVERY DAY, Disp: 90 tablet, Rfl: 1   cyanocobalamin (VITAMIN B12) 1000 MCG tablet, Take 1,000 mcg by mouth daily., Disp: , Rfl:    FARXIGA 10 MG TABS tablet, Take 10 mg by mouth every morning., Disp: , Rfl:    furosemide (LASIX) 40 MG tablet, Take 2 tablets in the morning and 1 tablet at night that is 80 mg in the morning and 40 mg at night, Disp: 60 tablet, Rfl: 11   glipiZIDE (GLUCOTROL) 5 MG tablet, Take 0.5 tablets (2.5 mg total) by mouth daily before breakfast., Disp: 30 tablet, Rfl: 2   glucose blood (TRUE METRIX BLOOD GLUCOSE TEST) test strip, 1 each by Other route daily. Use as instructed, Disp: 100 each, Rfl: 12   iron polysaccharides (NIFEREX) 150 MG capsule, Take 1 capsule (150 mg total) by mouth daily., Disp: 90 capsule, Rfl: 1   metoprolol tartrate (LOPRESSOR) 25 MG tablet, Take 1 tablet (25 mg total) by mouth 2 (two) times daily., Disp: 180 tablet, Rfl: 3   nitroGLYCERIN (NITROSTAT) 0.4 MG SL tablet, Place 0.4 mg under the tongue every 5 (five) minutes as needed for chest pain. (Patient not taking: Reported on 04/16/2022), Disp: , Rfl:    pantoprazole (PROTONIX) 40 MG tablet, TAKE 1 TABLET BY MOUTH DAILY BEFORE BREAKFAST, Disp: 90 tablet, Rfl: 3   traZODone (DESYREL) 50 MG tablet, TAKE 1 TABLET BY MOUTH EVERY DAY (Patient not taking: Reported on 04/16/2022), Disp: 90 tablet, Rfl: 1   MEDICATION ADHERENCES TIPS AND STRATEGIES Taking medication as prescribed improves patient outcomes in heart failure (reduces hospitalizations, improves symptoms, increases survival) Side effects of medications can be managed by decreasing doses, switching agents, stopping drugs, or adding additional therapy. Please let someone in the [facility] know if you have having bothersome side effects so  we can modify your regimen. Do not alter your medication regimen without talking to [place].  Medication reminders can help patients remember to take drugs on time. If you are missing or forgetting doses you can try linking behaviors, using pill boxes, or an electronic reminder like an alarm on your phone or an app. Some people can also get automated phone calls as medication reminders.

## 2022-04-16 NOTE — Patient Instructions (Addendum)
Continue weighing daily and call for an overnight weight gain of 3 pounds or more or a weekly weight gain of more than 5 pounds. ? ? ?If you have voicemail, please make sure your mailbox is cleaned out so that we may leave a message and please make sure to listen to any voicemails.  ? ? ?Get compression socks and put them on every morning with removal at bedtime ?

## 2022-04-18 DIAGNOSIS — J9601 Acute respiratory failure with hypoxia: Secondary | ICD-10-CM | POA: Diagnosis not present

## 2022-04-18 DIAGNOSIS — I5033 Acute on chronic diastolic (congestive) heart failure: Secondary | ICD-10-CM | POA: Diagnosis not present

## 2022-04-18 DIAGNOSIS — E1122 Type 2 diabetes mellitus with diabetic chronic kidney disease: Secondary | ICD-10-CM | POA: Diagnosis not present

## 2022-04-18 DIAGNOSIS — I130 Hypertensive heart and chronic kidney disease with heart failure and stage 1 through stage 4 chronic kidney disease, or unspecified chronic kidney disease: Secondary | ICD-10-CM | POA: Diagnosis not present

## 2022-04-18 DIAGNOSIS — N184 Chronic kidney disease, stage 4 (severe): Secondary | ICD-10-CM | POA: Diagnosis not present

## 2022-04-18 DIAGNOSIS — J449 Chronic obstructive pulmonary disease, unspecified: Secondary | ICD-10-CM | POA: Diagnosis not present

## 2022-04-21 DIAGNOSIS — R600 Localized edema: Secondary | ICD-10-CM | POA: Diagnosis not present

## 2022-04-21 DIAGNOSIS — I10 Essential (primary) hypertension: Secondary | ICD-10-CM | POA: Diagnosis not present

## 2022-04-21 DIAGNOSIS — N179 Acute kidney failure, unspecified: Secondary | ICD-10-CM | POA: Diagnosis not present

## 2022-04-21 DIAGNOSIS — E1122 Type 2 diabetes mellitus with diabetic chronic kidney disease: Secondary | ICD-10-CM | POA: Diagnosis not present

## 2022-04-21 DIAGNOSIS — N1832 Chronic kidney disease, stage 3b: Secondary | ICD-10-CM | POA: Diagnosis not present

## 2022-04-21 DIAGNOSIS — E870 Hyperosmolality and hypernatremia: Secondary | ICD-10-CM | POA: Diagnosis not present

## 2022-04-21 DIAGNOSIS — D631 Anemia in chronic kidney disease: Secondary | ICD-10-CM | POA: Diagnosis not present

## 2022-04-22 ENCOUNTER — Other Ambulatory Visit: Payer: Self-pay | Admitting: Internal Medicine

## 2022-04-22 ENCOUNTER — Other Ambulatory Visit: Payer: Self-pay | Admitting: Cardiovascular Disease

## 2022-04-24 DIAGNOSIS — E1122 Type 2 diabetes mellitus with diabetic chronic kidney disease: Secondary | ICD-10-CM | POA: Diagnosis not present

## 2022-04-24 DIAGNOSIS — I5033 Acute on chronic diastolic (congestive) heart failure: Secondary | ICD-10-CM | POA: Diagnosis not present

## 2022-04-24 DIAGNOSIS — I130 Hypertensive heart and chronic kidney disease with heart failure and stage 1 through stage 4 chronic kidney disease, or unspecified chronic kidney disease: Secondary | ICD-10-CM | POA: Diagnosis not present

## 2022-04-24 DIAGNOSIS — J449 Chronic obstructive pulmonary disease, unspecified: Secondary | ICD-10-CM | POA: Diagnosis not present

## 2022-04-24 DIAGNOSIS — J9601 Acute respiratory failure with hypoxia: Secondary | ICD-10-CM | POA: Diagnosis not present

## 2022-04-24 DIAGNOSIS — N184 Chronic kidney disease, stage 4 (severe): Secondary | ICD-10-CM | POA: Diagnosis not present

## 2022-04-28 ENCOUNTER — Ambulatory Visit (INDEPENDENT_AMBULATORY_CARE_PROVIDER_SITE_OTHER): Payer: Medicare Other | Admitting: Internal Medicine

## 2022-04-28 ENCOUNTER — Other Ambulatory Visit: Payer: Self-pay | Admitting: Cardiovascular Disease

## 2022-04-28 ENCOUNTER — Encounter: Payer: Self-pay | Admitting: Internal Medicine

## 2022-04-28 VITALS — BP 103/68 | HR 54 | Ht 70.0 in | Wt 230.0 lb

## 2022-04-28 DIAGNOSIS — I2510 Atherosclerotic heart disease of native coronary artery without angina pectoris: Secondary | ICD-10-CM | POA: Diagnosis not present

## 2022-04-28 DIAGNOSIS — I5033 Acute on chronic diastolic (congestive) heart failure: Secondary | ICD-10-CM | POA: Diagnosis not present

## 2022-04-28 DIAGNOSIS — E1159 Type 2 diabetes mellitus with other circulatory complications: Secondary | ICD-10-CM

## 2022-04-28 DIAGNOSIS — E1122 Type 2 diabetes mellitus with diabetic chronic kidney disease: Secondary | ICD-10-CM | POA: Diagnosis not present

## 2022-04-28 DIAGNOSIS — E785 Hyperlipidemia, unspecified: Secondary | ICD-10-CM

## 2022-04-28 DIAGNOSIS — I10 Essential (primary) hypertension: Secondary | ICD-10-CM

## 2022-04-28 DIAGNOSIS — N183 Chronic kidney disease, stage 3 unspecified: Secondary | ICD-10-CM

## 2022-04-28 LAB — GLUCOSE, POCT (MANUAL RESULT ENTRY): POC Glucose: 151 mg/dl — AB (ref 70–99)

## 2022-04-28 MED ORDER — ALPRAZOLAM 0.25 MG PO TABS: 0.2500 mg | ORAL_TABLET | Freq: Two times a day (BID) | ORAL | 0 refills | Status: AC | PRN

## 2022-04-28 MED ORDER — GLIPIZIDE 5 MG PO TABS: 5.0000 mg | ORAL_TABLET | Freq: Every day | ORAL | 2 refills | Status: DC

## 2022-04-28 NOTE — Assessment & Plan Note (Signed)

## 2022-04-28 NOTE — Progress Notes (Signed)
Established Patient Office Visit  Subjective:  Patient ID: Joshua Roy, male    DOB: [birth year]  Age: 71 y.o. MRN: [id number]  CC:  Chief Complaint  Patient presents with   Diabetes    Patient states that the glipizide is not helping his sugar stay under control.     Diabetes    Joshua Roy presents for check up  Past Medical History:  Diagnosis Date   (HFpEF) heart failure with preserved ejection fraction ([place])    a. 07/2019 Echo: EF 60-65%. Mod LVH. Gr1 DD. No rwma. Nl RV size/fxn. Mildly dil LA; b. 11/2019 Echo: EF 55-60%, no rwma, mild LVH. Nl RV size/fxn. Mod dil LA.   ACE-inhibitor cough    Anemia    Anxiety    Arrhythmia    atrial flutter   Bronchitis 06/27/2016   Carotid arterial disease ([place])    a. 09/2019 Carotid U/S: RICA 12-19%, LICA 7-58%; b. 02/3253 R CEA.   CHF (congestive heart failure) (HCC)    CKD (chronic kidney disease), stage III (HCC)    COPD (chronic obstructive pulmonary disease) (HCC)    cath, mild inf. hypokinesis EF 49%, 100% ROA   Coronary artery disease    a. 2001 Cath: LM 10, LAD 60p/m, D1 30, LCX 20p, RCA 124md-->Med Rx; b. 05/2017 Ex Mv: Ex time 5:46, antlat twi @ rest, fixed infarct w/ mild peri-infarct ischemia. EF 38%; c. 08/2019 Cath: LM 40ost, 70d, LAD 744mLCX 8068mCA 80-90p, 100m22m 09/2019 CABG x4: LIMA->LAD, VG->RI, VG->OM, VG->RPDA.   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 09/2019 post-op CABG-->[place].   Pneumonia    PSVT (paroxysmal supraventricular tachycardia)    a. 08/2019 [place]io: 189 brief runs of SVT (fastest 174 x 6 beats, longest 15 beats @ 107).   Tobacco abuse     Past Surgical History:  Procedure Laterality Date   CARDIAC CATHETERIZATION  05/06/2000   [facility] N/A 04/20/2018   Procedure: COLONOSCOPY WITH  PROPOFOL;  Surgeon: WohlLucilla Lame;  [facility];  Service: Endoscopy;  Laterality: N/A;   CORONARY ARTERY BYPASS GRAFT N/A 10/07/2019   Procedure: CORONARY ARTERY BYPASS GRAFTING (CABG) using LIMA to LAD; Endoscopic harvesting of left greater saphenous vein: SVG to RAMUS; SVG to OM1; SVG to PDA.;  Surgeon: BartGaye Pollack;  [facility]  Service: Open Heart Surgery;  Laterality: N/A;   ENDARTERECTOMY Right 10/07/2019   Procedure: ENDARTERECTOMY CAROTID;  Surgeon: EarlRosetta Posner;  [facility]  Service: Vascular;  Laterality: Right;   ENDOVEIN HARVEST OF GREATER SAPHENOUS VEIN Left 10/07/2019   Procedure: EndoCharleston RopesGreater Saphenous Vein;  Surgeon: BartGaye Pollack;  [facility]  Service: Open [facility] Surgery;  Laterality: Left;   ESOPHAGOGASTRODUODENOSCOPY ENDOSCOPY     KNEE ARTHROSCOPY  07/25/04   left   KNEENislandight, open surgery- repair   RIGHT/LEFT HEART CATH AND CORONARY ANGIOGRAPHY Bilateral 09/08/2019   Procedure: RIGHT/LEFT HEART CATH AND CORONARY ANGIOGRAPHY;  Surgeon: GollMinna Merritts;  [facility];  Service: Cardiovascular;  Laterality: Bilateral;   TEE WITHOUT CARDIOVERSION N/A 10/07/2019   Procedure: TRANSESOPHAGEAL ECHOCARDIOGRAM (TEE);  Surgeon: BartGaye Pollack;  Location[facility];  Service: Open Heart Surgery;  Laterality: N/A;   TOTAL KNEE ARTHROPLASTY Left 07/14/2016   Procedure: LEFT TOTAL KNEE ARTHROPLASTY;  Surgeon: Gaynelle Arabian, MD;  Location: [facility];  Service: Orthopedics;  Laterality: Left;   TOTAL KNEE ARTHROPLASTY Right 07/13/2017   Procedure: RIGHT TOTAL KNEE ARTHROPLASTY;  Surgeon: Gaynelle Arabian, MD;  Location: [facility];  Service: Orthopedics;  Laterality: Right;    Family History  Problem Relation Age of Onset   Heart disease Father        CAD, MI x 3   Hypertension Father    Alcohol abuse Father    Diabetes Father    Diabetes Sister    Cancer Brother        lymphoma, in  remission   Colon cancer Brother    Heart disease Sister        CABG x 3   Prostate cancer Neg Hx     Social History   Socioeconomic History   Marital status: Married    Spouse name: Janie   Number of children: 3   Years of education: Not on file   Highest education level: Not on file  Occupational History   Occupation: Engineer, manufacturing, Estate agent  Tobacco Use   Smoking status: Every Day    Packs/day: 1.00    Years: 52.00    Total pack years: 52.00    Types: Cigarettes   Smokeless tobacco: Never   Tobacco comments:    per patient wears a patch and has cut down on cigarettes/day (10/05/2019)  Vaping Use   Vaping Use: Never used  Substance and Sexual Activity   Alcohol use: Yes    Alcohol/week: 0.0 standard drinks of alcohol    Comment: 5-6 cocktails, 1 times a week   Drug use: No   Sexual activity: Not on file  Other Topics Concern   Not on file  Social History Narrative   From [place].  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   Social Determinants of Health   Financial Resource Strain: Low Risk  (06/14/2021)   Overall Financial Resource Strain (CARDIA)    Difficulty of Paying Living Expenses: Not hard at all  Food Insecurity: No Food Insecurity (04/27/2020)   Hunger Vital Sign    Worried About Running Out of Food in the Last Year: Never true    Ran Out of Food in the Last Year: Never true  Transportation Needs: No Transportation Needs (06/14/2021)   PRAPARE - Hydrologist (Medical): No    Lack of Transportation (Non-Medical): No  Physical Activity: Insufficiently Active (06/14/2021)   Exercise Vital Sign    Days of Exercise per Week: 2 days    Minutes of Exercise per Session: 20 min  Stress: No Stress Concern Present (06/14/2021)   [place]    Feeling of Stress : Not at all  Social Connections: Moderately Isolated (06/14/2021)   Social Connection and  Isolation Panel [NHANES]    Frequency of Communication with Friends and Family: More than three times a week    Frequency of Social Gatherings with Friends and Family: More than three times a week    Attends Religious Services: Never    Marine scientist or Organizations: No    Attends Archivist Meetings: Never    Marital Status: Married  Human resources officer Violence: Not At Risk (06/14/2021)   Humiliation, Afraid, Rape, and Kick  questionnaire    Fear of Current or Ex-Partner: No    Emotionally Abused: No    Physically Abused: No    Sexually Abused: No     Current Outpatient Medications:    ALPRAZolam (XANAX) 0.25 MG tablet, Take 1 tablet (0.25 mg total) by mouth 2 (two) times daily as needed for anxiety., Disp: 60 tablet, Rfl: 0   apixaban (ELIQUIS) 5 MG TABS tablet, Take 1 tablet (5 mg total) by mouth 2 (two) times daily., Disp: 60 tablet, Rfl: 6   atorvastatin (LIPITOR) 80 MG tablet, TAKE 1 TABLET BY MOUTH EVERY DAY, Disp: 90 tablet, Rfl: 1   cyanocobalamin (VITAMIN B12) 1000 MCG tablet, Take 1,000 mcg by mouth daily., Disp: , Rfl:    FARXIGA 10 MG TABS tablet, Take 10 mg by mouth every morning., Disp: , Rfl:    furosemide (LASIX) 40 MG tablet, Take 2 tablets in the morning and 1 tablet at night that is 80 mg in the morning and 40 mg at night, Disp: 60 tablet, Rfl: 11   glucose blood (TRUE METRIX BLOOD GLUCOSE TEST) test strip, 1 each by Other route daily. Use as instructed, Disp: 100 each, Rfl: 12   iron polysaccharides (NIFEREX) 150 MG capsule, Take 1 capsule (150 mg total) by mouth daily., Disp: 90 capsule, Rfl: 1   metoprolol tartrate (LOPRESSOR) 25 MG tablet, Take 1 tablet (25 mg total) by mouth 2 (two) times daily., Disp: 180 tablet, Rfl: 3   pantoprazole (PROTONIX) 40 MG tablet, TAKE 1 TABLET BY MOUTH DAILY BEFORE BREAKFAST, Disp: 90 tablet, Rfl: 3   glipiZIDE (GLUCOTROL) 5 MG tablet, Take 1 tablet (5 mg total) by mouth daily before breakfast., Disp: 30 tablet, Rfl:  2   Allergies  Allergen Reactions   Ramipril Cough and Other (See Comments)    REACTION: Cough   Pseudoephedrine-Guaifenesin Er Other (See Comments) and Hypertension    Elevated BP, insomnia Elevated BP, insomnia    ROS Review of Systems  Constitutional: Negative.   HENT: Negative.    Eyes: Negative.   Respiratory: Negative.    Cardiovascular: Negative.   Gastrointestinal: Negative.   Endocrine: Negative.   Genitourinary: Negative.   Musculoskeletal: Negative.   Skin: Negative.   Allergic/Immunologic: Negative.   Neurological: Negative.   Hematological: Negative.   Psychiatric/Behavioral: Negative.    All other systems reviewed and are negative.     Objective:    Physical Exam Vitals reviewed.  Constitutional:      Appearance: Normal appearance.  HENT:     Mouth/Throat:     Mouth: Mucous membranes are moist.  Eyes:     Pupils: Pupils are equal, round, and reactive to light.  Neck:     Vascular: No carotid bruit.  Cardiovascular:     Rate and Rhythm: Normal rate and regular rhythm.     Pulses: Normal pulses.     Heart sounds: Normal heart sounds.  Pulmonary:     Effort: Pulmonary effort is normal.     Breath sounds: Normal breath sounds.  Abdominal:     General: Bowel sounds are normal.     Palpations: Abdomen is soft. There is no hepatomegaly, splenomegaly or mass.     Tenderness: There is no abdominal tenderness.     Hernia: No hernia is present.  Musculoskeletal:     Cervical back: Neck supple.     Right lower leg: No edema.     Left lower leg: No edema.  Skin:    Findings: No rash.  Neurological:     Mental Status: He is alert and oriented to person, place, and time.     Motor: No weakness.  Psychiatric:        Mood and Affect: Mood normal.        Behavior: Behavior normal.     BP 103/68   Pulse (!) 54   Ht 5' 10"  (1.778 m)   Wt 230 lb (104.3 kg)   BMI 33.00 kg/m  Wt Readings from Last 3 Encounters:  04/28/22 230 lb (104.3 kg)   04/16/22 241 lb (109.3 kg)  04/09/22 249 lb 1.6 oz (113 kg)     Health Maintenance Due  Topic Date Due   COVID-19 Vaccine (1) Never done   Zoster Vaccines- Shingrix (1 of 2) Never done   OPHTHALMOLOGY EXAM  10/26/2021   FOOT EXAM  02/11/2022   INFLUENZA VACCINE  02/25/2022    There are no preventive care reminders to display for this patient.  Lab Results  Component Value Date   TSH 2.088 04/01/2022   Lab Results  Component Value Date   WBC 7.0 04/09/2022   HGB 12.5 (L) 04/09/2022   HCT 41.7 04/09/2022   MCV 77.2 (L) 04/09/2022   PLT 271 04/09/2022   Lab Results  Component Value Date   NA 139 04/04/2022   K 4.1 04/04/2022   CO2 37 (H) 04/04/2022   GLUCOSE 149 (H) 04/04/2022   BUN 57 (H) 04/04/2022   CREATININE 3.25 (H) 04/04/2022   BILITOT 0.6 03/18/2022   ALKPHOS 25 (L) 12/07/2019   AST 18 03/18/2022   ALT 16 03/18/2022   PROT 6.6 03/18/2022   ALBUMIN 3.8 03/28/2022   CALCIUM 9.2 04/04/2022   ANIONGAP 7 04/04/2022   EGFR 25 (L) 03/18/2022   GFR 65.78 11/15/2014   Lab Results  Component Value Date   CHOL 124 06/13/2021   Lab Results  Component Value Date   HDL 39 (L) 06/13/2021   Lab Results  Component Value Date   LDLCALC 63 06/13/2021   Lab Results  Component Value Date   TRIG 138 06/13/2021   Lab Results  Component Value Date   CHOLHDL 3.2 06/13/2021   Lab Results  Component Value Date   HGBA1C 7.0 (A) 12/20/2021      Assessment & Plan:   Problem List Items Addressed This Visit       Cardiovascular and Mediastinum   Essential hypertension     Patient denies any chest pain or shortness of breath there is no history of palpitation or paroxysmal nocturnal dyspnea   patient was advised to follow low-salt low-cholesterol diet    ideally I want to keep systolic blood pressure below 130 mmHg, patient was asked to check blood pressure one times a week and give me a report on that.  Patient will be follow-up in 3 months  or earlier as  needed, patient will call me back for any change in the cardiovascular symptoms Patient was advised to buy a book from local bookstore concerning blood pressure and read several chapters  every day.  This will be supplemented by some of the material we will give him from the office.  Patient should also utilize other resources like YouTube and Internet to learn more about the blood pressure and the diet.      (HFpEF) heart failure with preserved ejection fraction (HCC)    Chest is clear without any rhonchi        Endocrine   T2DM (type 2  diabetes mellitus) ([place]) - Primary    Increase glipizide to 5 mg p.o. daily      Relevant Medications   glipiZIDE (GLUCOTROL) 5 MG tablet   Other Relevant Orders   POCT glucose (manual entry) (Completed)   CKD stage 3 due to type 2 diabetes mellitus ([place])    Stop metformin      Relevant Medications   glipiZIDE (GLUCOTROL) 5 MG tablet    Meds ordered this encounter  Medications   ALPRAZolam (XANAX) 0.25 MG tablet    Sig: Take 1 tablet (0.25 mg total) by mouth 2 (two) times daily as needed for anxiety.    Dispense:  60 tablet    Refill:  0   glipiZIDE (GLUCOTROL) 5 MG tablet    Sig: Take 1 tablet (5 mg total) by mouth daily before breakfast.    Dispense:  30 tablet    Refill:  2    Follow-up: No follow-ups on file.    [name], MD

## 2022-04-28 NOTE — Assessment & Plan Note (Signed)
Stop metformin 

## 2022-04-28 NOTE — Assessment & Plan Note (Signed)
Chest is clear without any rhonchi

## 2022-04-28 NOTE — Assessment & Plan Note (Signed)
Increase glipizide to 5 mg p.o. daily

## 2022-04-30 DIAGNOSIS — I130 Hypertensive heart and chronic kidney disease with heart failure and stage 1 through stage 4 chronic kidney disease, or unspecified chronic kidney disease: Secondary | ICD-10-CM | POA: Diagnosis not present

## 2022-04-30 DIAGNOSIS — E1122 Type 2 diabetes mellitus with diabetic chronic kidney disease: Secondary | ICD-10-CM | POA: Diagnosis not present

## 2022-04-30 DIAGNOSIS — N184 Chronic kidney disease, stage 4 (severe): Secondary | ICD-10-CM | POA: Diagnosis not present

## 2022-04-30 DIAGNOSIS — J449 Chronic obstructive pulmonary disease, unspecified: Secondary | ICD-10-CM | POA: Diagnosis not present

## 2022-04-30 DIAGNOSIS — I5033 Acute on chronic diastolic (congestive) heart failure: Secondary | ICD-10-CM | POA: Diagnosis not present

## 2022-04-30 DIAGNOSIS — J9601 Acute respiratory failure with hypoxia: Secondary | ICD-10-CM | POA: Diagnosis not present

## 2022-05-06 ENCOUNTER — Other Ambulatory Visit: Payer: Self-pay | Admitting: Internal Medicine

## 2022-05-06 ENCOUNTER — Other Ambulatory Visit: Payer: Self-pay | Admitting: *Deleted

## 2022-05-06 DIAGNOSIS — E1122 Type 2 diabetes mellitus with diabetic chronic kidney disease: Secondary | ICD-10-CM | POA: Diagnosis not present

## 2022-05-06 DIAGNOSIS — I5033 Acute on chronic diastolic (congestive) heart failure: Secondary | ICD-10-CM | POA: Diagnosis not present

## 2022-05-06 DIAGNOSIS — I130 Hypertensive heart and chronic kidney disease with heart failure and stage 1 through stage 4 chronic kidney disease, or unspecified chronic kidney disease: Secondary | ICD-10-CM | POA: Diagnosis not present

## 2022-05-06 DIAGNOSIS — J449 Chronic obstructive pulmonary disease, unspecified: Secondary | ICD-10-CM | POA: Diagnosis not present

## 2022-05-06 DIAGNOSIS — J9601 Acute respiratory failure with hypoxia: Secondary | ICD-10-CM | POA: Diagnosis not present

## 2022-05-06 DIAGNOSIS — N184 Chronic kidney disease, stage 4 (severe): Secondary | ICD-10-CM | POA: Diagnosis not present

## 2022-05-06 MED ORDER — DAPAGLIFLOZIN PROPANEDIOL 10 MG PO TABS: 20.0000 mg | ORAL_TABLET | Freq: Every day | ORAL | 3 refills | Status: DC

## 2022-05-12 DIAGNOSIS — N179 Acute kidney failure, unspecified: Secondary | ICD-10-CM | POA: Diagnosis not present

## 2022-05-12 DIAGNOSIS — E1122 Type 2 diabetes mellitus with diabetic chronic kidney disease: Secondary | ICD-10-CM | POA: Diagnosis not present

## 2022-05-12 DIAGNOSIS — N1832 Chronic kidney disease, stage 3b: Secondary | ICD-10-CM | POA: Diagnosis not present

## 2022-05-15 ENCOUNTER — Ambulatory Visit: Payer: Medicare Other | Attending: Medical | Admitting: Medical

## 2022-05-15 ENCOUNTER — Encounter: Payer: Self-pay | Admitting: Medical

## 2022-05-15 VITALS — BP 102/68 | HR 77 | Ht 70.0 in | Wt 225.0 lb

## 2022-05-15 DIAGNOSIS — E782 Mixed hyperlipidemia: Secondary | ICD-10-CM | POA: Insufficient documentation

## 2022-05-15 DIAGNOSIS — I5032 Chronic diastolic (congestive) heart failure: Secondary | ICD-10-CM | POA: Diagnosis not present

## 2022-05-15 DIAGNOSIS — I10 Essential (primary) hypertension: Secondary | ICD-10-CM | POA: Diagnosis not present

## 2022-05-15 DIAGNOSIS — N184 Chronic kidney disease, stage 4 (severe): Secondary | ICD-10-CM | POA: Insufficient documentation

## 2022-05-15 DIAGNOSIS — I4819 Other persistent atrial fibrillation: Secondary | ICD-10-CM | POA: Insufficient documentation

## 2022-05-15 DIAGNOSIS — I2510 Atherosclerotic heart disease of native coronary artery without angina pectoris: Secondary | ICD-10-CM | POA: Insufficient documentation

## 2022-05-15 MED ORDER — APIXABAN 5 MG PO TABS: 5.0000 mg | ORAL_TABLET | Freq: Two times a day (BID) | ORAL | 0 refills | Status: DC

## 2022-05-15 NOTE — Progress Notes (Signed)
Cardiology Office Note:    Date:  05/15/2022   ID:  Joshua Roy, DOB [birth year], MRN [id number]  PCP:  Joshua Athens, MD  [facility] Cardiologist:  Joshua Rogue, MD  [facility] Electrophysiologist:  None   Referring MD: Joshua Athens, MD   Chief Complaint: Afib follow-up  History of Present Illness:    Joshua Roy is a 71 y.o. male with a hx of CAD status post CABG in 09/2019 with postoperative A-fib, HFpEF, DM 2, carotid artery disease status post CEA in 09/2019, CKD stage IV, HTN, HLD, obesity, and COPD with ongoing tobacco use who is being seen for 1 month follow-up.   Mr. Joshua Roy was admitted to the hospital in 2021 with presyncope followed by brief episode of syncope, though left AMA.  He was subsequently found to be volume overloaded with EF at that time showing preserved LV systolic function with diastolic dysfunction and no significant valvular disease.  LHC showed severe multivessel CAD.  He underwent CABG x4 in 09/2019.  Preoperative work-up showed severe right ICA stenosis was treated with CEA at the time of his CABG.  Postoperative course was noted for A-fib which was treated with amiodarone without recurrence.  He had a subsequent admission in 11/2019 with CHF exacerbation and multiorgan failure requiring significant supplemental oxygen at 2 L throughout his course.  Note indicates the patient was "in denial as to his numerous medical issues."  He has not required ischemic evaluation since his CABG.  He was admitted to the hospital in 07/2021 with acute respiratory failure secondary to COVID-pneumonia and HFpEF.  Echo during that admission demonstrated an EF of 55 to 60%, indeterminate LV diastolic function parameters, normal RV systolic function and ventricular cavity size, mild biatrial enlargement, and aortic valve sclerosis without evidence of stenosis.  He was seen in 11/2021 with an increase in lower extremity edema that improved with titration of  furosemide and addition of [place].   He was admitted to [facility] on 04/01/2022 with a 2-week history of increased shortness of breath and lower extremity swelling.  He was without symptoms of frank chest pain, palpitations, presyncope, or syncope.  He had gained approximately 20 pounds over that 2-week timeframe despite escalation of furosemide.  He reported adherence to his medications.  Upon admission, he was noted to be hypoxic with oxygen saturation of 78%, potassium 5.2 trending to 6.0, serum creatinine 3.94 trending to 5.45, BNP 267, and high-sensitivity troponin 22x2.  EKG showed new onset atrial flutter with variable AV block. He was managed with Lasix drip with the assistance of nephrology with improvement in serum creatinine. Echo showed an EF of 60 to 65%, severe LVH, mildly reduced RV systolic function with moderately enlarged ventricular cavity size, severely elevated PASP estimated at 78.4 mmHg, upper normal left atrial size, mildly dilated right atrium, trivial mitral regurgitation, mild to moderate tricuspid regurgitation, aortic valve sclerosis without evidence of stenosis, borderline dilatation of the aortic root, and an estimated right atrial pressure 15 mmHg. He was started on metoprolol '25mg'$ BID and Eliquis '5mg'$  BID with plan for an outpatient cardioversion after 4 weeks. Scr on discharge was 3.25. He was discharged on lasix '80mg'$  in the am and 40 in the pm.   He was seen in the office 04/08/22 and had not started his Eliquis. He was rate controlled. Plan to start Eliquis and re-evaluate for cardioversion at follow-up.  Today, the patient is in rate controlled Afib. He has lost weight, about 30-40lbs with diet and exercise.  He drinks alcohol occasionally. He is tolerating Eliquis well, does have some bruising. Patient is OK with pursuing cardioversion.  Past Medical History:  Diagnosis Date   (HFpEF) heart failure with preserved ejection fraction ([place])    a. 07/2019 Echo: EF 60-65%. Mod LVH.  Gr1 DD. No rwma. Nl RV size/fxn. Mildly dil LA; b. 11/2019 Echo: EF 55-60%, no rwma, mild LVH. Nl RV size/fxn. Mod dil LA.   ACE-inhibitor cough    Anemia    Anxiety    Arrhythmia    atrial flutter   Bronchitis 06/27/2016   Carotid arterial disease ([place])    a. 09/2019 Carotid U/S: RICA 65-46%, LICA 5-03%; b. 11/4654 R CEA.   CHF (congestive heart failure) (HCC)    CKD (chronic kidney disease), stage III (HCC)    COPD (chronic obstructive pulmonary disease) (HCC)    cath, mild inf. hypokinesis EF 49%, 100% ROA   Coronary artery disease    a. 2001 Cath: LM 10, LAD 60p/m, D1 30, LCX 20p, RCA 168md-->Med Rx; b. 05/2017 Ex Mv: Ex time 5:46, antlat twi @ rest, fixed infarct w/ mild peri-infarct ischemia. EF 38%; c. 08/2019 Cath: LM 40ost, 70d, LAD 73mLCX 8027mCA 80-90p, 100m68m 09/2019 CABG x4: LIMA->LAD, VG->RI, VG->OM, VG->RPDA.   Diabetes mellitus type II    Hyperlipidemia    Hypertension    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 09/2019 post-op CABG-->[place].   Pneumonia    PSVT (paroxysmal supraventricular tachycardia)    a. 08/2019 Zio: 189 brief runs of SVT (fastest 174 x 6 beats, longest 15 beats @ 107).   Tobacco abuse     Past Surgical History:  Procedure Laterality Date   CARDIAC CATHETERIZATION  05/06/2000   [facility] N/A 04/20/2018   Procedure: COLONOSCOPY WITH PROPOFOL;  Surgeon: WohlLucilla Lame;  [facility];  Service: Endoscopy;  Laterality: N/A;   CORONARY ARTERY BYPASS GRAFT N/A 10/07/2019   Procedure: CORONARY ARTERY BYPASS GRAFTING (CABG) using LIMA to LAD; Endoscopic harvesting of left greater saphenous vein: SVG to RAMUS; SVG to OM1; SVG to PDA.;  Surgeon: BartGaye Pollack;  [facility]  Service: Open Heart Surgery;  Laterality: N/A;   ENDARTERECTOMY Right 10/07/2019   Procedure: ENDARTERECTOMY CAROTID;  Surgeon:  EarlRosetta Posner;  [facility]  Service: Vascular;  Laterality: Right;   ENDOVEIN HARVEST OF GREATER SAPHENOUS VEIN Left 10/07/2019   Procedure: EndoCharleston RopesGreater Saphenous Vein;  Surgeon: BartGaye Pollack;  [facility]  Service: Open Heart Surgery;  Laterality: Left;   ESOPHAGOGASTRODUODENOSCOPY ENDOSCOPY     KNEE ARTHROSCOPY  07/25/04   left   KNEECyrilight, open surgery- repair   RIGHT/LEFT HEART CATH AND CORONARY ANGIOGRAPHY Bilateral 09/08/2019   Procedure: RIGHT/LEFT HEART CATH AND CORONARY ANGIOGRAPHY;  Surgeon: GollMinna Merritts;  [facility];  Service: Cardiovascular;  Laterality: Bilateral;   TEE WITHOUT CARDIOVERSION N/A 10/07/2019   Procedure: TRANSESOPHAGEAL ECHOCARDIOGRAM (TEE);  Surgeon: BartGaye Pollack;  [facility];  Laterality: N/A;   TOTAL KNEE ARTHROPLASTY Left 07/14/2016   Procedure: LEFT TOTAL KNEE ARTHROPLASTY;  Surgeon: FranGaynelle Arabian;  Location: [facility];  Service: Orthopedics;  Laterality: Left;   TOTAL KNEE ARTHROPLASTY Right 07/13/2017  Procedure: RIGHT TOTAL KNEE ARTHROPLASTY;  Surgeon: Gaynelle Arabian, MD;  Location: [facility];  Service: Orthopedics;  Laterality: Right;    Current Medications: Current Meds  Medication Sig   ALPRAZolam (XANAX) 0.25 MG tablet Take 1 tablet (0.25 mg total) by mouth 2 (two) times daily as needed for anxiety.   atorvastatin (LIPITOR) 80 MG tablet TAKE 1 TABLET BY MOUTH EVERY DAY   cyanocobalamin (VITAMIN B12) 1000 MCG tablet Take 1,000 mcg by mouth daily.   dapagliflozin propanediol (FARXIGA) 10 MG TABS tablet Take 2 tablets (20 mg total) by mouth daily at 2 PM.   furosemide (LASIX) 40 MG tablet Take 2 tablets in the morning and 1 tablet at night that is 80 mg in the morning and 40 mg at night   glipiZIDE (GLUCOTROL) 5 MG tablet Take 1 tablet (5 mg total) by mouth daily before breakfast.   glucose blood (TRUE METRIX BLOOD GLUCOSE TEST) test  strip 1 each by Other route daily. Use as instructed   iron polysaccharides (NIFEREX) 150 MG capsule Take 1 capsule (150 mg total) by mouth daily.   metoprolol tartrate (LOPRESSOR) 25 MG tablet Take 1 tablet (25 mg total) by mouth 2 (two) times daily.   pantoprazole (PROTONIX) 40 MG tablet TAKE 1 TABLET BY MOUTH DAILY BEFORE BREAKFAST   [DISCONTINUED] apixaban (ELIQUIS) 5 MG TABS tablet Take 1 tablet (5 mg total) by mouth 2 (two) times daily.     Allergies:   Ramipril and Pseudoephedrine-guaifenesin er   Social History   Socioeconomic History   Marital status: Married    Spouse name: Janie   Number of children: 3   Years of education: Not on file   Highest education level: Not on file  Occupational History   Occupation: Engineer, manufacturing, Estate agent  Tobacco Use   Smoking status: Every Day    Packs/day: 1.00    Years: 52.00    Total pack years: 52.00    Types: Cigarettes   Smokeless tobacco: Never   Tobacco comments:    per patient wears a patch and has cut down on cigarettes/day (10/05/2019)  Vaping Use   Vaping Use: Never used  Substance and Sexual Activity   Alcohol use: Yes    Alcohol/week: 0.0 standard drinks of alcohol    Comment: 5-6 cocktails, 1 times a week   Drug use: No   Sexual activity: Not on file  Other Topics Concern   Not on file  Social History Narrative   From [place].  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   Social Determinants of Health   Financial Resource Strain: Low Risk  (06/14/2021)   Overall Financial Resource Strain (CARDIA)    Difficulty of Paying Living Expenses: Not hard at all  Food Insecurity: No Food Insecurity (04/27/2020)   Hunger Vital Sign    Worried About Running Out of Food in the Last Year: Never true    Ran Out of Food in the Last Year: Never true  Transportation Needs: No Transportation Needs (06/14/2021)   PRAPARE - Hydrologist (Medical): No    Lack of Transportation (Non-Medical): No   Physical Activity: Insufficiently Active (06/14/2021)   Exercise Vital Sign    Days of Exercise per Week: 2 days    Minutes of Exercise per Session: 20 min  Stress: No Stress Concern Present (06/14/2021)   [place]    Feeling of Stress : Not at all  Social Connections: Moderately Isolated (06/14/2021)   Social Connection and Isolation Panel [NHANES]    Frequency of Communication with Friends and Family: More than three times a week    Frequency of Social Gatherings with Friends and Family: More than three times a week    Attends Religious Services: Never    Marine scientist or Organizations: No    Attends Music therapist: Never    Marital Status: Married     Family History: The patient's family history includes Alcohol abuse in his father; Cancer in his brother; Colon cancer in his brother; Diabetes in his father and sister; Heart disease in his father and sister; Hypertension in his father. There is no history of Prostate cancer.  ROS:   Please see the history of present illness.     All other systems reviewed and are negative.  EKGs/Labs/Other Studies Reviewed:    The following studies were reviewed today:  Echo 04/01/22  1. Left ventricular ejection fraction, by estimation, is 60 to 65%. The  left ventricle has normal function. Left ventricular endocardial border  not optimally defined to evaluate regional wall motion. There is severe  left ventricular hypertrophy. Left  ventricular diastolic parameters are indeterminate.   2. Right ventricular systolic function is mildly reduced. The right  ventricular size is moderately enlarged. Severely increased right  ventricular wall thickness. There is severely elevated pulmonary artery  systolic pressure. The estimated right  ventricular systolic pressure is 75.1 mmHg.   3. Left atrial size was upper normal.   4. Right atrial size was mildly  dilated.   5. The mitral valve is abnormal. Trivial mitral valve regurgitation.   6. Tricuspid valve regurgitation is mild to moderate.   7. The aortic valve is tricuspid. There is moderate calcification of the  aortic valve. There is moderate thickening of the aortic valve. Aortic  valve regurgitation is trivial. Aortic valve sclerosis/calcification is  present, without any evidence of  aortic stenosis.   8. Aortic dilatation noted. There is borderline dilatation of the aortic  root, measuring 38 mm.   9. The inferior vena cava is dilated in size with <50% respiratory  variability, suggesting right atrial pressure of 15 mmHg.   Echo 07/2021  1. Left ventricular ejection fraction, by estimation, is 55 to 60%. The  left ventricle has normal function. The left ventricle has no regional  wall motion abnormalities. Left ventricular diastolic parameters are  indeterminate.   2. Right ventricular systolic function is normal. The right ventricular  size is normal. Tricuspid regurgitation signal is inadequate for assessing  PA pressure.   3. Left atrial size was mildly dilated.   4. Right atrial size was mildly dilated.   5. The mitral valve is normal in structure. No evidence of mitral valve  regurgitation. No evidence of mitral stenosis.   6. The aortic valve is calcified. Aortic valve regurgitation is not  visualized. Aortic valve sclerosis/calcification is present, without any  evidence of aortic stenosis.   EKG:  EKG is ordered today.  The ekg ordered today demonstrates Afib 77bpm, RAD, nonspecific T wave changes  Recent Labs: 08/12/2021: Magnesium 1.9 03/18/2022: ALT 16 04/01/2022: B Natriuretic Peptide 267.2; TSH 2.088 04/04/2022: BUN 57; Creatinine, Ser 3.25; Potassium 4.1; Sodium 139 04/09/2022: Hemoglobin 12.5; Platelets 271  Recent Lipid Panel    Component Value Date/Time   CHOL 124 06/13/2021 0834   CHOL 113 05/14/2013 0252   TRIG 138 06/13/2021 0834   TRIG 232 (H) 05/14/2013  0252   HDL 39 (L) 06/13/2021 0834   HDL 23 (L) 05/14/2013 0252   CHOLHDL 3.2 06/13/2021 0834   VLDL 19 10/11/2019 0254   VLDL 46 (H) 05/14/2013 0252   LDLCALC 63 06/13/2021 0834   LDLCALC 44 05/14/2013 0252   LDLDIRECT 109.0 11/15/2014 0851     Physical Exam:    VS:  BP 102/68 (BP Location: Left Arm, Patient Position: Sitting, Cuff Size: Normal)   Pulse 77   Ht '5\' 10"'$  (1.778 m)   Wt 225 lb (102.1 kg)   SpO2 94%   BMI 32.28 kg/m     Wt Readings from Last 3 Encounters:  05/15/22 225 lb (102.1 kg)  04/28/22 230 lb (104.3 kg)  04/16/22 241 lb (109.3 kg)     GEN:  Well nourished, well developed in no acute distress HEENT: Normal NECK: No JVD; No carotid bruits LYMPHATICS: No lymphadenopathy CARDIAC: Irreg Irreg, no murmurs, rubs, gallops RESPIRATORY:  Clear to auscultation without rales, wheezing or rhonchi  ABDOMEN: Soft, non-tender, non-distended MUSCULOSKELETAL:  No edema; No deformity  SKIN: Warm and dry NEUROLOGIC:  Alert and oriented x 3 PSYCHIATRIC:  Normal affect   ASSESSMENT:    1. Persistent atrial fibrillation ([place])   2. Chronic heart failure with preserved ejection fraction ([place])   3. Essential hypertension   4. Mixed hyperlipidemia   5. Coronary artery disease involving native coronary artery of native heart without angina pectoris   6. CKD (chronic kidney disease) stage 4, GFR 15-29 ml/min (HCC)    PLAN:    In order of problems listed above:  Afib/flutter He remains in Afib/flutter with controlled rates. He is asymptomatic. He has been taking Eliquis since 9/13. We will set him up for a cardioversion. Continue Lopressor '25mg'$ BID for rate control. Continue Eliquis '5mg'$  BID for stroke ppx.   CAD s/p CABG Patient denies chest pain. No ASA with Eliquis '5mg'$  BID. Continue Lipitor and BB therapy.  No plan for further ischemic work-up at this time. He has been losing weight with diet and weight loss.   Chronic diastolic CHF Patient is euvolemic on exam.  Continue Farxiga '10mg'$  daily and lasix '80mg'$  in the am and '40mg'$  in the pm.   CKD stage 4 Scr 3.25 in 03/2022, which is around his baseline.  Disposition: Follow up in 3 week(s) with MD/APP   Shared Decision Making/Informed Consent   Shared Decision Making/Informed Consent The risks (stroke, cardiac arrhythmias rarely resulting in the need for a temporary or permanent pacemaker, skin irritation or burns and complications associated with conscious sedation including aspiration, arrhythmia, respiratory failure and death), benefits (restoration of normal sinus rhythm) and alternatives of a direct current cardioversion were explained in detail to Mr. Aloi and he agrees to proceed.      Signed, Corrie Reder Ninfa Meeker, PA-C  05/15/2022 3:33 PM    [facility]

## 2022-05-15 NOTE — Patient Instructions (Addendum)
Please call [facility]) at [phone] for assistance with finding a Primary Care Doctor- they can direct you on who is taking new patient's in the area.     Medication Instructions:  - Your physician recommends that you continue on your current medications as directed. Please refer to the Current Medication list given to you today.  *If you need a refill on your cardiac medications before your next appointment, please call your pharmacy*   Lab Work: - none ordered  If you have labs (blood work) drawn today and your tests are completely normal, you will receive your results only by: [place] (if you have MyChart) OR A paper copy in the mail If you have any lab test that is abnormal or we need to change your treatment, we will call you to review the results.   Testing/Procedures:  1) Cardioversion: - Your physician has recommended that you have a Cardioversion (DCCV). Electrical Cardioversion uses a jolt of electricity to your heart either through paddles or wired patches attached to your chest. This is a controlled, usually prescheduled, procedure. Defibrillation is done under light anesthesia in the hospital, and you usually go home the day of the procedure. This is done to get your heart back into a normal rhythm. You are not awake for the procedure.   You are scheduled for a Cardioversion on  Thursday 05/22/22 with Dr.Gollan.   Please arrive at the [place] [facility] at 7:00 a.m. on the day of your procedure.  DIET INSTRUCTIONS:  Nothing to eat or drink after midnight the night before your procedure.         Labs: none ordered  Medications:  You may take all of your regular morning medications the day of your procedure with enough water to get them down safely unless listed below:  HOLD Lasix (furosemide)/ Glipizide/ Farxiga the morning of your procedure   Must have a responsible person to drive you home.  Bring a current list of your  medications and current insurance cards.    If you have any questions after you get home, please call the office at [phone]   Follow-Up: At [facility], you and your health needs are our priority.  As part of our continuing mission to provide you with exceptional heart care, we have created designated Provider Care Teams.  These Care Teams include your primary Cardiologist (physician) and Advanced Practice Providers (APPs -  Physician Assistants and Nurse Practitioners) who all work together to provide you with the care you need, when you need it.  We recommend signing up for the patient portal called "MyChart".  Sign up information is provided on this After Visit Summary.  MyChart is used to connect with patients for Virtual Visits (Telemedicine).  Patients are able to view lab/test results, encounter notes, upcoming appointments, etc.  Non-urgent messages can be sent to your provider as well.   To learn more about what you can do with MyChart, go to [email or web address].    Your next appointment:   3 week(s)  The format for your next appointment:   In Person  Provider:   You may see Ida Rogue, MD or one of the following Advanced Practice Providers on your designated Care Team:    Cadence Kathlen Mody, [place]     Other Instructions  Electrical Cardioversion Electrical cardioversion is the delivery of a jolt of electricity to restore a normal rhythm to the heart. A rhythm that is too fast or is  not regular keeps the heart from pumping well. In this procedure, sticky patches or metal paddles are placed on the chest to deliver electricity to the heart from a device. This procedure may be done in an emergency if: There is low or no blood pressure as a result of the heart rhythm. Normal rhythm must be restored as fast as possible to protect the brain and heart from further damage. It may save a life. This may also be a scheduled procedure for irregular or fast heart rhythms  that are not immediately life-threatening. Tell a health care provider about: Any allergies you have. All medicines you are taking, including vitamins, herbs, eye drops, creams, and over-the-counter medicines. Any problems you or family members have had with anesthetic medicines. Any blood disorders you have. Any surgeries you have had. Any medical conditions you have. Whether you are pregnant or may be pregnant. What are the risks? Generally, this is a safe procedure. However, problems may occur, including: Allergic reactions to medicines. A blood clot that breaks free and travels to other parts of your body. The possible return of an abnormal heart rhythm within hours or days after the procedure. Your heart stopping (cardiac arrest). This is rare. What happens before the procedure? Medicines Your health care provider may have you start taking: Blood-thinning medicines (anticoagulants) so your blood does not clot as easily. Medicines to help stabilize your heart rate and rhythm. Ask your health care provider about: Changing or stopping your regular medicines. This is especially important if you are taking diabetes medicines or blood thinners. Taking medicines such as aspirin and ibuprofen. These medicines can thin your blood. Do not take these medicines unless your health care provider tells you to take them. Taking over-the-counter medicines, vitamins, herbs, and supplements. General instructions Follow instructions from your health care provider about eating or drinking restrictions. Plan to have someone take you home from the hospital or clinic. If you will be going home right after the procedure, plan to have someone with you for 24 hours. Ask your health care provider what steps will be taken to help prevent infection. These may include washing your skin with a germ-killing soap. What happens during the procedure?  An IV will be inserted into one of your veins. Sticky patches  (electrodes) or metal paddles may be placed on your chest. You will be given a medicine to help you relax (sedative). An electrical shock will be delivered. The procedure may vary among health care providers and hospitals. What can I expect after the procedure? Your blood pressure, heart rate, breathing rate, and blood oxygen level will be monitored until you leave the hospital or clinic. Your heart rhythm will be watched to make sure it does not change. You may have some redness on the skin where the shocks were given. Follow these instructions at home: Do not drive for 24 hours if you were given a sedative during your procedure. Take over-the-counter and prescription medicines only as told by your health care provider. Ask your health care provider how to check your pulse. Check it often. Rest for 48 hours after the procedure or as told by your health care provider. Avoid or limit your caffeine use as told by your health care provider. Keep all follow-up visits as told by your health care provider. This is important. Contact a health care provider if: You feel like your heart is beating too quickly or your pulse is not regular. You have a serious muscle cramp that does  not go away. Get help right away if: You have discomfort in your chest. You are dizzy or you feel faint. You have trouble breathing or you are short of breath. Your speech is slurred. You have trouble moving an arm or leg on one side of your body. Your fingers or toes turn cold or blue. Summary Electrical cardioversion is the delivery of a jolt of electricity to restore a normal rhythm to the heart. This procedure may be done right away in an emergency or may be a scheduled procedure if the condition is not an emergency. Generally, this is a safe procedure. After the procedure, check your pulse often as told by your health care provider. This information is not intended to replace advice given to you by your health care  provider. Make sure you discuss any questions you have with your health care provider. Document Revised: 06/13/2021 Document Reviewed: 02/14/2019 Elsevier Patient Education  [place]

## 2022-05-15 NOTE — Addendum Note (Signed)
Addended by: Antony Madura on: 05/15/2022 03:37 PM   Modules accepted: Orders

## 2022-05-15 NOTE — H&P (View-Only) (Signed)
Cardiology Office Note:    Date:  05/15/2022   ID:  Joshua Roy, DOB 25-Nov-1950, MRN [id number]  PCP:  Joshua Athens, MD  [facility] Cardiologist:  Ida Rogue, MD  [facility] Electrophysiologist:  None   Referring MD: Joshua Athens, MD   Chief Complaint: Afib follow-up  History of Present Illness:    Joshua Roy is a 71 y.o. male with a hx of CAD status post CABG in 09/2019 with postoperative A-fib, HFpEF, DM 2, carotid artery disease status post CEA in 09/2019, CKD stage IV, HTN, HLD, obesity, and COPD with ongoing tobacco use who is being seen for 1 month follow-up.   Joshua Roy was admitted to the hospital in 2021 with presyncope followed by brief episode of syncope, though left AMA.  He was subsequently found to be volume overloaded with EF at that time showing preserved LV systolic function with diastolic dysfunction and no significant valvular disease.  LHC showed severe multivessel CAD.  He underwent CABG x4 in 09/2019.  Preoperative work-up showed severe right ICA stenosis was treated with CEA at the time of his CABG.  Postoperative course was noted for A-fib which was treated with amiodarone without recurrence.  He had a subsequent admission in 11/2019 with CHF exacerbation and multiorgan failure requiring significant supplemental oxygen at 2 L throughout his course.  Note indicates the patient was "in denial as to his numerous medical issues."  He has not required ischemic evaluation since his CABG.  He was admitted to the hospital in 07/2021 with acute respiratory failure secondary to COVID-pneumonia and HFpEF.  Echo during that admission demonstrated an EF of 55 to 60%, indeterminate LV diastolic function parameters, normal RV systolic function and ventricular cavity size, mild biatrial enlargement, and aortic valve sclerosis without evidence of stenosis.  He was seen in 11/2021 with an increase in lower extremity edema that improved with titration of  furosemide and addition of [place].   He was admitted to [facility] on 04/01/2022 with a 2-week history of increased shortness of breath and lower extremity swelling.  He was without symptoms of frank chest pain, palpitations, presyncope, or syncope.  He had gained approximately 20 pounds over that 2-week timeframe despite escalation of furosemide.  He reported adherence to his medications.  Upon admission, he was noted to be hypoxic with oxygen saturation of 78%, potassium 5.2 trending to 6.0, serum creatinine 3.94 trending to 5.45, BNP 267, and high-sensitivity troponin 22x2.  EKG showed new onset atrial flutter with variable AV block. He was managed with Lasix drip with the assistance of nephrology with improvement in serum creatinine. Echo showed an EF of 60 to 65%, severe LVH, mildly reduced RV systolic function with moderately enlarged ventricular cavity size, severely elevated PASP estimated at 78.4 mmHg, upper normal left atrial size, mildly dilated right atrium, trivial mitral regurgitation, mild to moderate tricuspid regurgitation, aortic valve sclerosis without evidence of stenosis, borderline dilatation of the aortic root, and an estimated right atrial pressure 15 mmHg. He was started on metoprolol '25mg'$ BID and Eliquis '5mg'$  BID with plan for an outpatient cardioversion after 4 weeks. Scr on discharge was 3.25. He was discharged on lasix '80mg'$  in the am and 40 in the pm.   He was seen in the office 04/08/22 and had not started his Eliquis. He was rate controlled. Plan to start Eliquis and re-evaluate for cardioversion at follow-up.  Today, the patient is in rate controlled Afib. He has lost weight, about 30-40lbs with diet and exercise.  He drinks alcohol occasionally. He is tolerating Eliquis well, does have some bruising. Patient is OK with pursuing cardioversion.  Past Medical History:  Diagnosis Date   (HFpEF) heart failure with preserved ejection fraction ([place])    a. 07/2019 Echo: EF 60-65%. Mod LVH.  Gr1 DD. No rwma. Nl RV size/fxn. Mildly dil LA; b. 11/2019 Echo: EF 55-60%, no rwma, mild LVH. Nl RV size/fxn. Mod dil LA.   ACE-inhibitor cough    Anemia    Anxiety    Arrhythmia    atrial flutter   Bronchitis 06/27/2016   Carotid arterial disease ([place])    a. 09/2019 Carotid U/S: RICA 53-97%, LICA 6-73%; b. 10/1935 R CEA.   CHF (congestive heart failure) (HCC)    CKD (chronic kidney disease), stage III (HCC)    COPD (chronic obstructive pulmonary disease) (HCC)    cath, mild inf. hypokinesis EF 49%, 100% ROA   Coronary artery disease    a. 2001 Cath: LM 10, LAD 60p/m, D1 30, LCX 20p, RCA 134md-->Med Rx; b. 05/2017 Ex Mv: Ex time 5:46, antlat twi @ rest, fixed infarct w/ mild peri-infarct ischemia. EF 38%; c. 08/2019 Cath: LM 40ost, 70d, LAD 735mLCX 8015mCA 80-90p, 100m24m 09/2019 CABG x4: LIMA->LAD, VG->RI, VG->OM, VG->RPDA.   Diabetes mellitus type II    Hyperlipidemia    Hypertension    [facility] (osteoarthritis)    knee OA, injected 2012 by ortho   PAF (paroxysmal atrial fibrillation) (HCC)Yettem a. 09/2019 post-op CABG-->Amio.   Pneumonia    PSVT (paroxysmal supraventricular tachycardia)    a. 08/2019 Zio: 189 brief runs of SVT (fastest 174 x 6 beats, longest 15 beats @ 107).   Tobacco abuse     Past Surgical History:  Procedure Laterality Date   CARDIAC CATHETERIZATION  05/06/2000   [facility] N/A 04/20/2018   Procedure: COLONOSCOPY WITH PROPOFOL;  Surgeon: WohlLucilla Lame;  [facility];  Service: Endoscopy;  Laterality: N/A;   CORONARY ARTERY BYPASS GRAFT N/A 10/07/2019   Procedure: CORONARY ARTERY BYPASS GRAFTING (CABG) using LIMA to LAD; Endoscopic harvesting of left greater saphenous vein: SVG to RAMUS; SVG to OM1; SVG to PDA.;  Surgeon: BartGaye Pollack;  [facility]  Service: Open Heart Surgery;  Laterality: N/A;   ENDARTERECTOMY Right 10/07/2019   Procedure: ENDARTERECTOMY CAROTID;  Surgeon:  EarlRosetta Posner;  [facility]  Service: Vascular;  Laterality: Right;   ENDOVEIN HARVEST OF GREATER SAPHENOUS VEIN Left 10/07/2019   Procedure: EndoCharleston RopesGreater Saphenous Vein;  Surgeon: BartGaye Pollack;  [facility]  Service: Open Heart Surgery;  Laterality: Left;   ESOPHAGOGASTRODUODENOSCOPY ENDOSCOPY     KNEE ARTHROSCOPY  07/25/04   left   [place], open surgery- repair   RIGHT/LEFT HEART CATH AND CORONARY ANGIOGRAPHY Bilateral 09/08/2019   Procedure: RIGHT/LEFT HEART CATH AND CORONARY ANGIOGRAPHY;  Surgeon: GollMinna Merritts;  [facility];  Service: Cardiovascular;  Laterality: Bilateral;   TEE WITHOUT CARDIOVERSION N/A 10/07/2019   Procedure: TRANSESOPHAGEAL ECHOCARDIOGRAM (TEE);  Surgeon: BartGaye Pollack;  [facility] Surgery;  Laterality: N/A;   TOTAL KNEE ARTHROPLASTY Left 07/14/2016   Procedure: LEFT TOTAL KNEE ARTHROPLASTY;  Surgeon: FranGaynelle Arabian;  Location: [facility];  Service: Orthopedics;  Laterality: Left;   TOTAL KNEE ARTHROPLASTY Right 07/13/2017  Procedure: RIGHT TOTAL KNEE ARTHROPLASTY;  Surgeon: Gaynelle Arabian, MD;  Location: [facility];  Service: Orthopedics;  Laterality: Right;    Current Medications: Current Meds  Medication Sig   ALPRAZolam (XANAX) 0.25 MG tablet Take 1 tablet (0.25 mg total) by mouth 2 (two) times daily as needed for anxiety.   atorvastatin (LIPITOR) 80 MG tablet TAKE 1 TABLET BY MOUTH EVERY DAY   cyanocobalamin (VITAMIN B12) 1000 MCG tablet Take 1,000 mcg by mouth daily.   dapagliflozin propanediol (FARXIGA) 10 MG TABS tablet Take 2 tablets (20 mg total) by mouth daily at 2 PM.   furosemide (LASIX) 40 MG tablet Take 2 tablets in the morning and 1 tablet at night that is 80 mg in the morning and 40 mg at night   glipiZIDE (GLUCOTROL) 5 MG tablet Take 1 tablet (5 mg total) by mouth daily before breakfast.   glucose blood (TRUE METRIX BLOOD GLUCOSE TEST) test  strip 1 each by Other route daily. Use as instructed   iron polysaccharides (NIFEREX) 150 MG capsule Take 1 capsule (150 mg total) by mouth daily.   metoprolol tartrate (LOPRESSOR) 25 MG tablet Take 1 tablet (25 mg total) by mouth 2 (two) times daily.   pantoprazole (PROTONIX) 40 MG tablet TAKE 1 TABLET BY MOUTH DAILY BEFORE BREAKFAST   [DISCONTINUED] apixaban (ELIQUIS) 5 MG TABS tablet Take 1 tablet (5 mg total) by mouth 2 (two) times daily.     Allergies:   Ramipril and Pseudoephedrine-guaifenesin er   Social History   Socioeconomic History   Marital status: Married    Spouse name: Janie   Number of children: 3   Years of education: Not on file   Highest education level: Not on file  Occupational History   Occupation: Engineer, manufacturing, Estate agent  Tobacco Use   Smoking status: Every Day    Packs/day: 1.00    Years: 52.00    Total pack years: 52.00    Types: Cigarettes   Smokeless tobacco: Never   Tobacco comments:    per patient wears a patch and has cut down on cigarettes/day (10/05/2019)  Vaping Use   Vaping Use: Never used  Substance and Sexual Activity   Alcohol use: Yes    Alcohol/week: 0.0 standard drinks of alcohol    Comment: 5-6 cocktails, 1 times a week   Drug use: No   Sexual activity: Not on file  Other Topics Concern   Not on file  Social History Narrative   From [place].  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   Social Determinants of Health   Financial Resource Strain: Low Risk  (06/14/2021)   Overall Financial Resource Strain (CARDIA)    Difficulty of Paying Living Expenses: Not hard at all  Food Insecurity: No Food Insecurity (04/27/2020)   Hunger Vital Sign    Worried About Running Out of Food in the Last Year: Never true    Ran Out of Food in the Last Year: Never true  Transportation Needs: No Transportation Needs (06/14/2021)   PRAPARE - Hydrologist (Medical): No    Lack of Transportation (Non-Medical): No   Physical Activity: Insufficiently Active (06/14/2021)   Exercise Vital Sign    Days of Exercise per Week: 2 days    Minutes of Exercise per Session: 20 min  Stress: No Stress Concern Present (06/14/2021)   [place]    Feeling of Stress : Not at all  Social Connections: Moderately Isolated (06/14/2021)   Social Connection and Isolation Panel [NHANES]    Frequency of Communication with Friends and Family: More than three times a week    Frequency of Social Gatherings with Friends and Family: More than three times a week    Attends Religious Services: Never    Marine scientist or Organizations: No    Attends Music therapist: Never    Marital Status: Married     Family History: The patient's family history includes Alcohol abuse in his father; Cancer in his brother; Colon cancer in his brother; Diabetes in his father and sister; Heart disease in his father and sister; Hypertension in his father. There is no history of Prostate cancer.  ROS:   Please see the history of present illness.     All other systems reviewed and are negative.  EKGs/Labs/Other Studies Reviewed:    The following studies were reviewed today:  Echo 04/01/22  1. Left ventricular ejection fraction, by estimation, is 60 to 65%. The  left ventricle has normal function. Left ventricular endocardial border  not optimally defined to evaluate regional wall motion. There is severe  left ventricular hypertrophy. Left  ventricular diastolic parameters are indeterminate.   2. Right ventricular systolic function is mildly reduced. The right  ventricular size is moderately enlarged. Severely increased right  ventricular wall thickness. There is severely elevated pulmonary artery  systolic pressure. The estimated right  ventricular systolic pressure is 46.5 mmHg.   3. Left atrial size was upper normal.   4. Right atrial size was mildly  dilated.   5. The mitral valve is abnormal. Trivial mitral valve regurgitation.   6. Tricuspid valve regurgitation is mild to moderate.   7. The aortic valve is tricuspid. There is moderate calcification of the  aortic valve. There is moderate thickening of the aortic valve. Aortic  valve regurgitation is trivial. Aortic valve sclerosis/calcification is  present, without any evidence of  aortic stenosis.   8. Aortic dilatation noted. There is borderline dilatation of the aortic  root, measuring 38 mm.   9. The inferior vena cava is dilated in size with <50% respiratory  variability, suggesting right atrial pressure of 15 mmHg.   Echo 07/2021  1. Left ventricular ejection fraction, by estimation, is 55 to 60%. The  left ventricle has normal function. The left ventricle has no regional  wall motion abnormalities. Left ventricular diastolic parameters are  indeterminate.   2. Right ventricular systolic function is normal. The right ventricular  size is normal. Tricuspid regurgitation signal is inadequate for assessing  PA pressure.   3. Left atrial size was mildly dilated.   4. Right atrial size was mildly dilated.   5. The mitral valve is normal in structure. No evidence of mitral valve  regurgitation. No evidence of mitral stenosis.   6. The aortic valve is calcified. Aortic valve regurgitation is not  visualized. Aortic valve sclerosis/calcification is present, without any  evidence of aortic stenosis.   EKG:  EKG is ordered today.  The ekg ordered today demonstrates Afib 77bpm, RAD, nonspecific T wave changes  Recent Labs: 08/12/2021: Magnesium 1.9 03/18/2022: ALT 16 04/01/2022: B Natriuretic Peptide 267.2; TSH 2.088 04/04/2022: BUN 57; Creatinine, Ser 3.25; Potassium 4.1; Sodium 139 04/09/2022: Hemoglobin 12.5; Platelets 271  Recent Lipid Panel    Component Value Date/Time   CHOL 124 06/13/2021 0834   CHOL 113 05/14/2013 0252   TRIG 138 06/13/2021 0834   TRIG 232 (H) 05/14/2013  0252   HDL 39 (L) 06/13/2021 0834   HDL 23 (L) 05/14/2013 0252   CHOLHDL 3.2 06/13/2021 0834   VLDL 19 10/11/2019 0254   VLDL 46 (H) 05/14/2013 0252   LDLCALC 63 06/13/2021 0834   LDLCALC 44 05/14/2013 0252   LDLDIRECT 109.0 11/15/2014 0851     Physical Exam:    VS:  BP 102/68 (BP Location: Left Arm, Patient Position: Sitting, Cuff Size: Normal)   Pulse 77   Ht '5\' 10"'$  (1.778 m)   Wt 225 lb (102.1 kg)   SpO2 94%   BMI 32.28 kg/m     Wt Readings from Last 3 Encounters:  05/15/22 225 lb (102.1 kg)  04/28/22 230 lb (104.3 kg)  04/16/22 241 lb (109.3 kg)     GEN:  Well nourished, well developed in no acute distress HEENT: Normal NECK: No JVD; No carotid bruits LYMPHATICS: No lymphadenopathy CARDIAC: Irreg Irreg, no murmurs, rubs, gallops RESPIRATORY:  Clear to auscultation without rales, wheezing or rhonchi  ABDOMEN: Soft, non-tender, non-distended MUSCULOSKELETAL:  No edema; No deformity  SKIN: Warm and dry NEUROLOGIC:  Alert and oriented x 3 PSYCHIATRIC:  Normal affect   ASSESSMENT:    1. Persistent atrial fibrillation ([place])   2. Chronic heart failure with preserved ejection fraction ([place])   3. Essential hypertension   4. Mixed hyperlipidemia   5. Coronary artery disease involving native coronary artery of native heart without angina pectoris   6. CKD (chronic kidney disease) stage 4, GFR 15-29 ml/min (HCC)    PLAN:    In order of problems listed above:  Afib/flutter He remains in Afib/flutter with controlled rates. He is asymptomatic. He has been taking Eliquis since 9/13. We will set him up for a cardioversion. Continue Lopressor '25mg'$ BID for rate control. Continue Eliquis '5mg'$  BID for stroke ppx.   CAD s/p CABG Patient denies chest pain. No ASA with Eliquis '5mg'$  BID. Continue Lipitor and BB therapy.  No plan for further ischemic work-up at this time. He has been losing weight with diet and weight loss.   Chronic diastolic CHF Patient is euvolemic on exam.  Continue Farxiga '10mg'$  daily and lasix '80mg'$  in the am and '40mg'$  in the pm.   CKD stage 4 Scr 3.25 in 03/2022, which is around his baseline.  Disposition: Follow up in 3 week(s) with MD/APP   Shared Decision Making/Informed Consent   Shared Decision Making/Informed Consent The risks (stroke, cardiac arrhythmias rarely resulting in the need for a temporary or permanent pacemaker, skin irritation or burns and complications associated with conscious sedation including aspiration, arrhythmia, respiratory failure and death), benefits (restoration of normal sinus rhythm) and alternatives of a direct current cardioversion were explained in detail to Mr. Gervasi and he agrees to proceed.      Signed, Teancum Brule Ninfa Meeker, PA-C  05/15/2022 3:33 PM    [facility]

## 2022-05-19 ENCOUNTER — Other Ambulatory Visit: Payer: Self-pay

## 2022-05-19 ENCOUNTER — Other Ambulatory Visit
Admission: RE | Admit: 2022-05-19 | Discharge: 2022-05-19 | Disposition: A | Discharge status: Home / Self Care | Payer: Medicare Other | Attending: Medical | Admitting: Medical

## 2022-05-19 ENCOUNTER — Inpatient Hospital Stay: Payer: Medicare Other | Attending: Internal Medicine

## 2022-05-19 DIAGNOSIS — Z807 Family history of other malignant neoplasms of lymphoid, hematopoietic and related tissues: Secondary | ICD-10-CM | POA: Insufficient documentation

## 2022-05-19 DIAGNOSIS — D509 Iron deficiency anemia, unspecified: Secondary | ICD-10-CM

## 2022-05-19 DIAGNOSIS — E1122 Type 2 diabetes mellitus with diabetic chronic kidney disease: Secondary | ICD-10-CM | POA: Diagnosis not present

## 2022-05-19 DIAGNOSIS — N184 Chronic kidney disease, stage 4 (severe): Secondary | ICD-10-CM | POA: Diagnosis not present

## 2022-05-19 DIAGNOSIS — I5030 Unspecified diastolic (congestive) heart failure: Secondary | ICD-10-CM | POA: Insufficient documentation

## 2022-05-19 DIAGNOSIS — Z7901 Long term (current) use of anticoagulants: Secondary | ICD-10-CM | POA: Insufficient documentation

## 2022-05-19 DIAGNOSIS — I5033 Acute on chronic diastolic (congestive) heart failure: Secondary | ICD-10-CM | POA: Insufficient documentation

## 2022-05-19 DIAGNOSIS — I130 Hypertensive heart and chronic kidney disease with heart failure and stage 1 through stage 4 chronic kidney disease, or unspecified chronic kidney disease: Secondary | ICD-10-CM | POA: Insufficient documentation

## 2022-05-19 DIAGNOSIS — F17210 Nicotine dependence, cigarettes, uncomplicated: Secondary | ICD-10-CM | POA: Diagnosis not present

## 2022-05-19 DIAGNOSIS — Z800 Family history of malignant neoplasm of digestive organs: Secondary | ICD-10-CM | POA: Insufficient documentation

## 2022-05-19 DIAGNOSIS — I4891 Unspecified atrial fibrillation: Secondary | ICD-10-CM | POA: Diagnosis not present

## 2022-05-19 LAB — CBC WITH DIFFERENTIAL/PLATELET
Abs Immature Granulocytes: 0.02 10*3/uL (ref 0.00–0.07)
Basophils Absolute: 0.1 10*3/uL (ref 0.0–0.1)
Basophils Relative: 1 %
Eosinophils Absolute: 0 10*3/uL (ref 0.0–0.5)
Eosinophils Relative: 0 %
HCT: 53.7 % — ABNORMAL HIGH (ref 39.0–52.0)
Hemoglobin: 16.7 g/dL (ref 13.0–17.0)
Immature Granulocytes: 0 %
Lymphocytes Relative: 26 %
Lymphs Abs: 2 10*3/uL (ref 0.7–4.0)
MCH: 24.6 pg — ABNORMAL LOW (ref 26.0–34.0)
MCHC: 31.1 g/dL (ref 30.0–36.0)
MCV: 79.1 fL — ABNORMAL LOW (ref 80.0–100.0)
Monocytes Absolute: 0.7 10*3/uL (ref 0.1–1.0)
Monocytes Relative: 9 %
Neutro Abs: 4.9 10*3/uL (ref 1.7–7.7)
Neutrophils Relative %: 64 %
Platelets: 241 10*3/uL (ref 150–400)
RBC: 6.79 MIL/uL — ABNORMAL HIGH (ref 4.22–5.81)
RDW: 22.7 % — ABNORMAL HIGH (ref 11.5–15.5)
WBC: 7.7 10*3/uL (ref 4.0–10.5)
nRBC: 0 % (ref 0.0–0.2)

## 2022-05-19 LAB — BASIC METABOLIC PANEL
Anion gap: 12 (ref 5–15)
BUN: 36 mg/dL — ABNORMAL HIGH (ref 8–23)
CO2: 30 mmol/L (ref 22–32)
Calcium: 9.5 mg/dL (ref 8.9–10.3)
Chloride: 98 mmol/L (ref 98–111)
Creatinine, Ser: 2.56 mg/dL — ABNORMAL HIGH (ref 0.61–1.24)
GFR, Estimated: 26 mL/min — ABNORMAL LOW (ref >60)
Glucose, Bld: 147 mg/dL — ABNORMAL HIGH (ref 70–99)
Potassium: 3.7 mmol/L (ref 3.5–5.1)
Sodium: 140 mmol/L (ref 135–145)

## 2022-05-19 LAB — IRON AND TIBC
Iron: 62 ug/dL (ref 45–182)
Saturation Ratios: 17 % — ABNORMAL LOW (ref 17.9–39.5)
TIBC: 364 ug/dL (ref 250–450)
UIBC: 302 ug/dL

## 2022-05-20 ENCOUNTER — Other Ambulatory Visit: Payer: Self-pay | Admitting: *Deleted

## 2022-05-20 MED ORDER — GLIPIZIDE 5 MG PO TABS: 5.0000 mg | ORAL_TABLET | Freq: Two times a day (BID) | ORAL | 2 refills | Status: DC

## 2022-05-21 DIAGNOSIS — E1122 Type 2 diabetes mellitus with diabetic chronic kidney disease: Secondary | ICD-10-CM | POA: Diagnosis not present

## 2022-05-21 DIAGNOSIS — R600 Localized edema: Secondary | ICD-10-CM | POA: Diagnosis not present

## 2022-05-21 DIAGNOSIS — N179 Acute kidney failure, unspecified: Secondary | ICD-10-CM | POA: Diagnosis not present

## 2022-05-21 DIAGNOSIS — N184 Chronic kidney disease, stage 4 (severe): Secondary | ICD-10-CM | POA: Diagnosis not present

## 2022-05-22 ENCOUNTER — Encounter
Admission: RE | Disposition: A | Discharge status: Home / Self Care | Payer: Self-pay | Source: Home / Self Care | Attending: Cardiovascular Disease

## 2022-05-22 ENCOUNTER — Ambulatory Visit: Payer: Medicare Other | Admitting: Anesthesiology

## 2022-05-22 ENCOUNTER — Ambulatory Visit
Admission: RE | Admit: 2022-05-22 | Discharge: 2022-05-22 | Disposition: A | Discharge status: Home / Self Care | Payer: Medicare Other | Attending: Cardiovascular Disease | Admitting: Cardiovascular Disease

## 2022-05-22 ENCOUNTER — Encounter: Payer: Self-pay | Admitting: Cardiovascular Disease

## 2022-05-22 ENCOUNTER — Ambulatory Visit: Payer: Medicare Other | Admitting: Internal Medicine

## 2022-05-22 ENCOUNTER — Other Ambulatory Visit: Payer: Self-pay

## 2022-05-22 DIAGNOSIS — N183 Chronic kidney disease, stage 3 unspecified: Secondary | ICD-10-CM | POA: Insufficient documentation

## 2022-05-22 DIAGNOSIS — I5033 Acute on chronic diastolic (congestive) heart failure: Secondary | ICD-10-CM | POA: Diagnosis not present

## 2022-05-22 DIAGNOSIS — I083 Combined rheumatic disorders of mitral, aortic and tricuspid valves: Secondary | ICD-10-CM | POA: Insufficient documentation

## 2022-05-22 DIAGNOSIS — Z79899 Other long term (current) drug therapy: Secondary | ICD-10-CM | POA: Insufficient documentation

## 2022-05-22 DIAGNOSIS — F17210 Nicotine dependence, cigarettes, uncomplicated: Secondary | ICD-10-CM | POA: Insufficient documentation

## 2022-05-22 DIAGNOSIS — I130 Hypertensive heart and chronic kidney disease with heart failure and stage 1 through stage 4 chronic kidney disease, or unspecified chronic kidney disease: Secondary | ICD-10-CM | POA: Diagnosis not present

## 2022-05-22 DIAGNOSIS — G4733 Obstructive sleep apnea (adult) (pediatric): Secondary | ICD-10-CM | POA: Diagnosis not present

## 2022-05-22 DIAGNOSIS — E669 Obesity, unspecified: Secondary | ICD-10-CM | POA: Insufficient documentation

## 2022-05-22 DIAGNOSIS — I5032 Chronic diastolic (congestive) heart failure: Secondary | ICD-10-CM | POA: Insufficient documentation

## 2022-05-22 DIAGNOSIS — Z8249 Family history of ischemic heart disease and other diseases of the circulatory system: Secondary | ICD-10-CM | POA: Diagnosis not present

## 2022-05-22 DIAGNOSIS — J449 Chronic obstructive pulmonary disease, unspecified: Secondary | ICD-10-CM | POA: Diagnosis not present

## 2022-05-22 DIAGNOSIS — Z6832 Body mass index (BMI) 32.0-32.9, adult: Secondary | ICD-10-CM | POA: Diagnosis not present

## 2022-05-22 DIAGNOSIS — E1122 Type 2 diabetes mellitus with diabetic chronic kidney disease: Secondary | ICD-10-CM | POA: Insufficient documentation

## 2022-05-22 DIAGNOSIS — E1151 Type 2 diabetes mellitus with diabetic peripheral angiopathy without gangrene: Secondary | ICD-10-CM | POA: Insufficient documentation

## 2022-05-22 DIAGNOSIS — E782 Mixed hyperlipidemia: Secondary | ICD-10-CM | POA: Diagnosis not present

## 2022-05-22 DIAGNOSIS — Z7901 Long term (current) use of anticoagulants: Secondary | ICD-10-CM | POA: Diagnosis not present

## 2022-05-22 DIAGNOSIS — I4819 Other persistent atrial fibrillation: Secondary | ICD-10-CM | POA: Diagnosis not present

## 2022-05-22 DIAGNOSIS — I2510 Atherosclerotic heart disease of native coronary artery without angina pectoris: Secondary | ICD-10-CM | POA: Insufficient documentation

## 2022-05-22 DIAGNOSIS — Z7984 Long term (current) use of oral hypoglycemic drugs: Secondary | ICD-10-CM | POA: Insufficient documentation

## 2022-05-22 DIAGNOSIS — Z951 Presence of aortocoronary bypass graft: Secondary | ICD-10-CM | POA: Diagnosis not present

## 2022-05-22 DIAGNOSIS — I252 Old myocardial infarction: Secondary | ICD-10-CM | POA: Insufficient documentation

## 2022-05-22 DIAGNOSIS — I4891 Unspecified atrial fibrillation: Secondary | ICD-10-CM | POA: Diagnosis not present

## 2022-05-22 DIAGNOSIS — F17200 Nicotine dependence, unspecified, uncomplicated: Secondary | ICD-10-CM | POA: Diagnosis not present

## 2022-05-22 HISTORY — PX: CARDIOVERSION: SHX1299

## 2022-05-22 LAB — GLUCOSE, CAPILLARY: Glucose-Capillary: 150 mg/dL — ABNORMAL HIGH (ref 70–99)

## 2022-05-22 SURGERY — CARDIOVERSION
Anesthesia: General

## 2022-05-22 MED ORDER — PROPOFOL 10 MG/ML IV BOLUS
INTRAVENOUS | Status: DC | PRN
  Administered 2022-05-22: 80 mg via INTRAVENOUS

## 2022-05-22 MED ORDER — SODIUM CHLORIDE 0.9 % IV SOLN: INTRAVENOUS | Status: DC

## 2022-05-22 NOTE — CV Procedure (Signed)
Cardioversion procedure note For atrial fibrillation, persistent.  Procedure Details:  Consent: Risks of procedure as well as the alternatives and risks of each were explained to the (patient/caregiver).  Consent for procedure obtained.  Time Out: Verified patient identification, verified procedure, site/side was marked, verified correct patient position, special equipment/implants available, medications/allergies/relevent history reviewed, required imaging and test results available.  Performed  Patient placed on cardiac monitor, pulse oximetry, supplemental oxygen as necessary.   Sedation given: propofol IV, Dr. Zak Pacer pads placed anterior and posterior chest.   Cardioverted 1 time(s).   Cardioverted at  150 J. Synchronized biphasic Converted to NSR   Evaluation: Findings: Post procedure EKG shows: NSR Complications: None Patient did tolerate procedure well.  Time Spent Directly with the Patient:  45 minutes   Tim Cyntha Brickman, M.D., Ph.D.  

## 2022-05-22 NOTE — Anesthesia Postprocedure Evaluation (Signed)
Anesthesia Post Note  Patient: Joshua Roy  Procedure(s) Performed: CARDIOVERSION  Patient location during evaluation: Specials Recovery Anesthesia Type: General Level of consciousness: awake and alert Pain management: pain level controlled Vital Signs Assessment: post-procedure vital signs reviewed and stable Respiratory status: spontaneous breathing, nonlabored ventilation, respiratory function stable and patient connected to nasal cannula oxygen Cardiovascular status: blood pressure returned to baseline and stable Postop Assessment: no apparent nausea or vomiting Anesthetic complications: no   No notable events documented.   Last Vitals:  Vitals:   05/22/22 0810 05/22/22 0820  BP: (!) 116/90 114/62  Pulse: 64 66  Resp: 20 20  Temp:    SpO2: 93% 95%    Last Pain:  Vitals:   05/22/22 0820  TempSrc:   PainSc: 0-No pain                 Arita Miss

## 2022-05-22 NOTE — H&P (Signed)
H&P Addendum, pre-cardioversion ° °Patient was seen and evaluated prior to -cardioversion procedure °Symptoms, prior testing details again confirmed with the patient °Patient examined, no significant change from prior exam °Lab work reviewed in detail personally by myself °Patient understands risk and benefit of the procedure,  °The risks (stroke, cardiac arrhythmias rarely resulting in the need for a temporary or permanent pacemaker, skin irritation or burns and complications associated with conscious sedation including aspiration, arrhythmia, respiratory failure and death), benefits (restoration of normal sinus rhythm) and alternatives of a direct current cardioversion were explained in detail °Patient willing to proceed. ° °Signed, °Tim Rashanda Magloire, MD, Ph.D °[facility]  °

## 2022-05-22 NOTE — Interval H&P Note (Signed)
History and Physical Interval Note:  05/22/2022 8:05 AM  Joshua Roy  has presented today for surgery, with the diagnosis of Cardioversion   Afib  OK 2nd case Dr Andree Elk  8a start time.  The various methods of treatment have been discussed with the patient and family. After consideration of risks, benefits and other options for treatment, the patient has consented to  Procedure(s): CARDIOVERSION (N/A) as a surgical intervention.  The patient's history has been reviewed, patient examined, no change in status, stable for surgery.  I have reviewed the patient's chart and labs.  Questions were answered to the patient's satisfaction.     Ida Rogue

## 2022-05-22 NOTE — Anesthesia Preprocedure Evaluation (Signed)
Anesthesia Evaluation  Patient identified by MRN, date of birth, ID band Patient awake    Reviewed: Allergy & Precautions, H&P , NPO status , Patient's Chart, lab work & pertinent test results, reviewed documented beta blocker date and time   History of Anesthesia Complications Negative for: history of anesthetic complications  Airway Mallampati: II  TM Distance: >3 FB Neck ROM: Full    Dental no notable dental hx. (+) Teeth Intact, Dental Advisory Given   Pulmonary neg sleep apnea, COPD, Current Smoker and Patient abstained from smoking.,  Hx OSA but has lost 40 lbs and says he does not snore anymore   Pulmonary exam normal breath sounds clear to auscultation       Cardiovascular Exercise Tolerance: Good METShypertension, Pt. on medications and Pt. on home beta blockers pulmonary hypertension(-) angina+ CAD, + Past MI, + CABG, + Peripheral Vascular Disease and +CHF  (-) dysrhythmias + Valvular Problems/Murmurs  Rhythm:Irregular Rate:Normal - Systolic murmurs 1. Left ventricular ejection fraction, by estimation, is 60 to 65%. The  left ventricle has normal function. Left ventricular endocardial border  not optimally defined to evaluate regional wall motion. There is severe  left ventricular hypertrophy. Left  ventricular diastolic parameters are indeterminate.  2. Right ventricular systolic function is mildly reduced. The right  ventricular size is moderately enlarged. Severely increased right  ventricular wall thickness. There is severely elevated pulmonary artery  systolic pressure. The estimated right  ventricular systolic pressure is 33.2 mmHg.  3. Left atrial size was upper normal.  4. Right atrial size was mildly dilated.  5. The mitral valve is abnormal. Trivial mitral valve regurgitation.  6. Tricuspid valve regurgitation is mild to moderate.  7. The aortic valve is tricuspid. There is moderate calcification of the   aortic valve. There is moderate thickening of the aortic valve. Aortic  valve regurgitation is trivial. Aortic valve sclerosis/calcification is  present, without any evidence of  aortic stenosis.  8. Aortic dilatation noted. There is borderline dilatation of the aortic  root, measuring 38 mm.  9. The inferior vena cava is dilated in size with <50% respiratory  variability, suggesting right atrial pressure of 15 mmHg.    Neuro/Psych PSYCHIATRIC DISORDERS Anxiety negative neurological ROS     GI/Hepatic Neg liver ROS, PUD, neg GERD  ,  Endo/Other  diabetes, Type 2, Oral Hypoglycemic AgentsMorbid obesity  Renal/GU CRFRenal disease  negative genitourinary   Musculoskeletal  (+) Arthritis , Osteoarthritis,    Abdominal   Peds  Hematology negative hematology ROS (+)   Anesthesia Other Findings Past Medical History: No date: (HFpEF) heart failure with preserved ejection fraction ([place])     Comment:  a. 07/2019 Echo: EF 60-65%. Mod LVH. Gr1 DD. No rwma. Nl               RV size/fxn. Mildly dil LA; b. 11/2019 Echo: EF 55-60%, no              rwma, mild LVH. Nl RV size/fxn. Mod dil LA. No date: ACE-inhibitor cough No date: Anemia No date: Anxiety No date: Arrhythmia     Comment:  atrial flutter 06/27/2016: Bronchitis No date: Carotid arterial disease ([place])     Comment:  a. 09/2019 Carotid U/S: RICA 95-18%, LICA 8-41%; b.               09/2019 R CEA. No date: CHF (congestive heart failure) (HCC) No date: CKD (chronic kidney disease), stage III (HCC) No date: COPD (chronic obstructive pulmonary disease) (  [place])     Comment:  cath, mild inf. hypokinesis EF 49%, 100% ROA No date: Coronary artery disease     Comment:  a. 2001 Cath: LM 10, LAD 60p/m, D1 30, LCX 20p, RCA               142md-->Med Rx; b. 05/2017 Ex Mv: Ex time 5:46, antlat               twi @ rest, fixed infarct w/ mild peri-infarct ischemia.               EF 38%; c. 08/2019 Cath: LM 40ost, 70d, LAD 770mLCX 8038m              RCA 80-90p, 100m65m 09/2019 CABG x4: LIMA->LAD, VG->RI,               VG->OM, VG->RPDA. No date: Diabetes mellitus type II No date: Hyperlipidemia No date: Hypertension age 22: 54 (myocardial infarction) (HCC)[place] date: OA (osteoarthritis)     Comment:  knee OA, injected 2012 by ortho No date: PAF (paroxysmal atrial fibrillation) (HCC)[place]  Comment:  a. 09/2019 post-op CABG-->Amio. No date: Pneumonia No date: PSVT (paroxysmal supraventricular tachycardia)     Comment:  a. 08/2019 Zio: 189 brief runs of SVT (fastest 174 x 6               beats, longest 15 beats @ 107). No date: Tobacco abuse  Reproductive/Obstetrics negative OB ROS                             Anesthesia Physical  Anesthesia Plan  ASA: 4  Anesthesia Plan: General   Post-op Pain Management: Minimal or no pain anticipated   Induction: Intravenous  PONV Risk Score and Plan: 1 and Propofol infusion, TIVA and Ondansetron  Airway Management Planned: Nasal Cannula  Additional Equipment: None  Intra-op Plan:   Post-operative Plan:   Informed Consent: I have reviewed the patients History and Physical, chart, labs and discussed the procedure including the risks, benefits and alternatives for the proposed anesthesia with the patient or authorized representative who has indicated his/her understanding and acceptance.     Dental advisory given  Plan Discussed with: CRNA and Surgeon  Anesthesia Plan Comments: (Discussed risks of anesthesia with patient, including possibility of difficulty with spontaneous ventilation under anesthesia necessitating airway intervention, PONV, and rare risks such as cardiac or respiratory or neurological events, and allergic reactions. Discussed the role of CRNA in patient's perioperative care. Patient understands. Patient counseled on being higher risk for anesthesia due to comorbidities: HFpEF, COPD. Patient was told about increased risk of cardiac and  respiratory events, including death. )        Anesthesia Quick Evaluation

## 2022-05-22 NOTE — Transfer of Care (Signed)
Immediate Anesthesia Transfer of Care Note  Patient: Juda Toepfer Caponigro  Procedure(s) Performed: CARDIOVERSION  Patient Location: Short Stay  Anesthesia Type:General  Level of Consciousness: drowsy  Airway & Oxygen Therapy: Patient Spontanous Breathing and Patient connected to nasal cannula oxygen  Post-op Assessment: Report given to RN and Post -op Vital signs reviewed and stable  Post vital signs: Reviewed and stable  Last Vitals:  Vitals Value Taken Time  BP 112/67 05/22/22 0805  Temp    Pulse 62 05/22/22 0805  Resp 19 05/22/22 0805  SpO2 91 % 05/22/22 0805    Last Pain:  Vitals:   05/22/22 0724  TempSrc: Oral  PainSc: 0-No pain         Complications: No notable events documented.

## 2022-05-23 ENCOUNTER — Encounter: Payer: Self-pay | Admitting: Cardiovascular Disease

## 2022-05-24 ENCOUNTER — Other Ambulatory Visit: Payer: Self-pay | Admitting: Cardiovascular Disease

## 2022-05-24 DIAGNOSIS — E785 Hyperlipidemia, unspecified: Secondary | ICD-10-CM

## 2022-05-25 ENCOUNTER — Other Ambulatory Visit: Payer: Self-pay | Admitting: Cardiovascular Disease

## 2022-05-25 ENCOUNTER — Other Ambulatory Visit: Payer: Self-pay | Admitting: Internal Medicine

## 2022-05-26 ENCOUNTER — Encounter: Payer: Self-pay | Admitting: Internal Medicine

## 2022-05-26 ENCOUNTER — Inpatient Hospital Stay (HOSPITAL_BASED_OUTPATIENT_CLINIC_OR_DEPARTMENT_OTHER): Payer: Medicare Other | Admitting: Internal Medicine

## 2022-05-26 VITALS — BP 125/83 | HR 71 | Temp 96.6°F | Resp 19 | Wt 223.4 lb

## 2022-05-26 DIAGNOSIS — I130 Hypertensive heart and chronic kidney disease with heart failure and stage 1 through stage 4 chronic kidney disease, or unspecified chronic kidney disease: Secondary | ICD-10-CM | POA: Diagnosis not present

## 2022-05-26 DIAGNOSIS — F17210 Nicotine dependence, cigarettes, uncomplicated: Secondary | ICD-10-CM | POA: Diagnosis not present

## 2022-05-26 DIAGNOSIS — N184 Chronic kidney disease, stage 4 (severe): Secondary | ICD-10-CM | POA: Diagnosis not present

## 2022-05-26 DIAGNOSIS — E1122 Type 2 diabetes mellitus with diabetic chronic kidney disease: Secondary | ICD-10-CM | POA: Diagnosis not present

## 2022-05-26 DIAGNOSIS — D509 Iron deficiency anemia, unspecified: Secondary | ICD-10-CM

## 2022-05-26 DIAGNOSIS — I5030 Unspecified diastolic (congestive) heart failure: Secondary | ICD-10-CM | POA: Diagnosis not present

## 2022-05-26 NOTE — Progress Notes (Signed)
[place]  [phone] [phone]  ID: Joshua Roy OB: 1950-09-22  MR#: [id number]  PVX#:[id number]  Patient Care Team: [name], MD as PCP - General (Internal Medicine) Rockey Situ, Kathlene November, MD as PCP - Cardiology (Cardiology) Minna Merritts, MD as Consulting Physician (Cardiology)  REFERRING PROVIDER: Dr. [name]  REASON FOR REFERRAL: iron deficiency anemia   HPI: Joshua Roy is a 71 y.o. male with past medical history of HFpEF, CAD s/p CABG, diabetes, hyperlipidemia and A-fib was referred to hematology clinic for further management of iron deficiency anemia.  Patient reports shortness of breath on exertion and extreme fatigue for past 1 week.  He was in the wheelchair which he said usual.  He has occasional dizziness.  He started oral iron last Wednesday.  Denies any bleeding in stool, urine, or nose bleeding.  Denies any prior history of iron deficiency.  Patient denies fever, chills, nausea, vomiting, cough, abdominal pain, bowel or bladder issues.  Labs reviewed.  Iron panel showed ferritin of 10.  CBC not performed. Last colonoscopy was in 2019 which showed tubular adenoma in ascending descending and sigmoid colon.  He was due for repeat colonoscopy last year however he misunderstood about stopping his blood thinner as a result he could not get the procedure.  INTERVAL HISTORY-  Patient seen today to assess response to IV iron. He is doing much better compared to his last clinic visit.  He has been losing weight intentionally.  His shortness of breath has resolved  His energy levels are better. Denies any bleeding, fever, chills, shortness of breath or chest pain.  REVIEW OF SYSTEMS:   ROS  As per HPI. Otherwise, a complete review of systems is negative.  PAST MEDICAL HISTORY: Past Medical History:  Diagnosis Date   (HFpEF) heart failure with preserved ejection fraction ([place])    a. 07/2019 Echo: EF 60-65%. Mod  LVH. Gr1 DD. No rwma. Nl RV size/fxn. Mildly dil LA; b. 11/2019 Echo: EF 55-60%, no rwma, mild LVH. Nl RV size/fxn. Mod dil LA.   ACE-inhibitor cough    Anemia    Anxiety    Arrhythmia    atrial flutter   Bronchitis 06/27/2016   Carotid arterial disease ([place])    a. 09/2019 Carotid U/S: RICA 48-27%, LICA 0-78%; b. 12/7542 R CEA.   CHF (congestive heart failure) (HCC)    CKD (chronic kidney disease), stage III (HCC)    COPD (chronic obstructive pulmonary disease) (HCC)    cath, mild inf. hypokinesis EF 49%, 100% ROA   Coronary artery disease    a. 2001 Cath: LM 10, LAD 60p/m, D1 30, LCX 20p, RCA 168md-->Med Rx; b. 05/2017 Ex Mv: Ex time 5:46, antlat twi @ rest, fixed infarct w/ mild peri-infarct ischemia. EF 38%; c. 08/2019 Cath: LM 40ost, 70d, LAD 773mLCX 8010mCA 80-90p, 100m81m 09/2019 CABG x4: LIMA->LAD, VG->RI, VG->OM, VG->RPDA.   Diabetes mellitus type II    Hyperlipidemia    Hypertension    [facility] (oste[facility])    knee OA, injected 2012 by ortho   PAF (paroxysmal atrial fibrillation) (HCC)[place] a. 09/2019 post-op CABG-->Amio.   Pneumonia    PSVT (paroxysmal supraventricular tachycardia)    a. 08/2019 Zio: 189 brief runs of SVT (fastest 174 x 6 beats, longest 15 beats @ 107).   Tobacco abuse     PAST SURGICAL HISTORY: Past Surgical History:  Procedure Laterality Date   CARDIAC  CATHETERIZATION  05/06/2000   @ [place] N/A 05/22/2022   Procedure: CARDIOVERSION;  Surgeon: Minna Merritts, MD;  Location: [facility] ORS;  Service: Cardiovascular;  Laterality: N/A;   COLONOSCOPY WITH PROPOFOL N/A 04/20/2018   Procedure: COLONOSCOPY WITH PROPOFOL;  Surgeon: [name], MD;  Location: [facility] ENDOSCOPY;  Service: Endoscopy;  Laterality: N/A;   CORONARY ARTERY BYPASS GRAFT N/A 10/07/2019   Procedure: CORONARY ARTERY BYPASS GRAFTING (CABG) using LIMA to LAD; Endoscopic harvesting of left greater saphenous vein: SVG to RAMUS; SVG to OM1; SVG to  PDA.;  Surgeon: Gaye Pollack, MD;  Location: [facility] OR;  Service: Open Heart Surgery;  Laterality: N/A;   ENDARTERECTOMY Right 10/07/2019   Procedure: ENDARTERECTOMY CAROTID;  Surgeon: Rosetta Posner, MD;  Location: [facility] OR;  Service: Vascular;  Laterality: Right;   ENDOVEIN HARVEST OF GREATER SAPHENOUS VEIN Left 10/07/2019   Procedure: [place] Of Greater Saphenous Vein;  Surgeon: Gaye Pollack, MD;  Location[facility];  Service: Open Heart Surgery;  Laterality: Left;   ESOPHAGOGASTRODUODENOSCOPY ENDOSCOPY     KNEE ARTHROSCOPY  07/25/04   left   [place]   right, open surgery- repair   RIGHT/LEFT HEART CATH AND CORONARY ANGIOGRAPHY Bilateral 09/08/2019   Procedure: RIGHT/LEFT HEART CATH AND CORONARY ANGIOGRAPHY;  Surgeon: Minna Merritts, MD;  Location: [place] CV LAB;  Service: Cardiovascular;  Laterality: Bilateral;   TEE WITHOUT CARDIOVERSION N/A 10/07/2019   Procedure: TRANSESOPHAGEAL ECHOCARDIOGRAM (TEE);  Surgeon: Gaye Pollack, MD;  Location: [place];  Service: Open Heart Surgery;  Laterality: N/A;   TOTAL KNEE ARTHROPLASTY Left 07/14/2016   Procedure: LEFT TOTAL KNEE ARTHROPLASTY;  Surgeon: Gaynelle Arabian, MD;  Location: [facility] ORS;  Service: Orthopedics;  Laterality: Left;   TOTAL KNEE ARTHROPLASTY Right 07/13/2017   Procedure: RIGHT TOTAL KNEE ARTHROPLASTY;  Surgeon: Gaynelle Arabian, MD;  Location: [facility] ORS;  Service: Orthopedics;  Laterality: Right;    FAMILY HISTORY: Family History  Problem Relation Age of Onset   Heart disease Father        CAD, MI x 3   Hypertension Father    Alcohol abuse Father    Diabetes Father    Diabetes Sister    Cancer Brother        lymphoma, in remission   Colon cancer Brother    Heart disease Sister        CABG x 3   Prostate cancer Neg Hx     HEALTH MAINTENANCE: Social History   Tobacco Use   Smoking status: Every Day    Packs/day: 1.00    Years: 52.00    Total pack years: 52.00    Types: Cigarettes   Smokeless  tobacco: Never   Tobacco comments:    per patient wears a patch and has cut down on cigarettes/day (10/05/2019)  Vaping Use   Vaping Use: Never used  Substance Use Topics   Alcohol use: Yes    Alcohol/week: 0.0 standard drinks of alcohol    Comment: 5-6 cocktails, 1 times a week   Drug use: No     Allergies  Allergen Reactions   Ramipril Cough and Other (See Comments)    REACTION: Cough   Pseudoephedrine-Guaifenesin Er Other (See Comments) and Hypertension    Elevated BP, insomnia Elevated BP, insomnia    Current Outpatient Medications  Medication Sig Dispense Refill   ALPRAZolam (XANAX) 0.25 MG tablet Take 1 tablet (0.25 mg total) by mouth 2 (two) times daily as needed  for anxiety. 60 tablet 0   apixaban (ELIQUIS) 5 MG TABS tablet Take 1 tablet (5 mg total) by mouth 2 (two) times daily. 180 tablet 0   atorvastatin (LIPITOR) 80 MG tablet TAKE 1 TABLET BY MOUTH EVERY DAY 90 tablet 1   cyanocobalamin (VITAMIN B12) 1000 MCG tablet Take 1,000 mcg by mouth daily.     dapagliflozin propanediol (FARXIGA) 10 MG TABS tablet Take 2 tablets (20 mg total) by mouth daily at 2 PM. 60 tablet 3   furosemide (LASIX) 40 MG tablet Take 2 tablets in the morning and 1 tablet at night that is 80 mg in the morning and 40 mg at night 60 tablet 11   glimepiride (AMARYL) 2 MG tablet TAKE 1/2 TABLET BY MOUTH IN THE MORNING 45 tablet 7   glipiZIDE (GLUCOTROL) 5 MG tablet Take 1 tablet (5 mg total) by mouth 2 (two) times daily before a meal. 60 tablet 2   glucose blood (TRUE METRIX BLOOD GLUCOSE TEST) test strip 1 each by Other route daily. Use as instructed 100 each 12   iron polysaccharides (NIFEREX) 150 MG capsule Take 1 capsule (150 mg total) by mouth daily. 90 capsule 1   metoprolol tartrate (LOPRESSOR) 25 MG tablet Take 1 tablet (25 mg total) by mouth 2 (two) times daily. 180 tablet 3   pantoprazole (PROTONIX) 40 MG tablet TAKE 1 TABLET BY MOUTH DAILY BEFORE BREAKFAST 90 tablet 3   No current  facility-administered medications for this visit.    OBJECTIVE: Vitals:   05/26/22 1044  BP: 125/83  Pulse: 71  Resp: 19  Temp: (!) 96.6 F (35.9 C)  SpO2: 93%     Body mass index is 32.05 kg/m.      General: Well-developed, well-nourished, no acute distress. Eyes: Pink conjunctiva, anicteric sclera. HEENT: Normocephalic, moist mucous membranes, clear oropharnyx. Lungs: Clear to auscultation bilaterally. Heart: Regular rate and rhythm. No rubs, murmurs, or gallops. Abdomen: Soft, nontender, nondistended. No organomegaly noted, normoactive bowel sounds. Musculoskeletal: No edema, cyanosis, or clubbing. Neuro: Alert, answering all questions appropriately. Cranial nerves grossly intact. Skin: No rashes or petechiae noted. Psych: Normal affect. Lymphatics: No cervical, calvicular, axillary or inguinal LAD.   LAB RESULTS:  Lab Results  Component Value Date   NA 140 05/19/2022   K 3.7 05/19/2022   CL 98 05/19/2022   CO2 30 05/19/2022   GLUCOSE 147 (H) 05/19/2022   BUN 36 (H) 05/19/2022   CREATININE 2.56 (H) 05/19/2022   CALCIUM 9.5 05/19/2022   PROT 6.6 03/18/2022   ALBUMIN 3.8 03/28/2022   AST 18 03/18/2022   ALT 16 03/18/2022   ALKPHOS 25 (L) 12/07/2019   BILITOT 0.6 03/18/2022   GFRNONAA 26 (L) 05/19/2022   GFRAA 39 (L) 06/12/2020    Lab Results  Component Value Date   WBC 7.7 05/19/2022   NEUTROABS 4.9 05/19/2022   HGB 16.7 05/19/2022   HCT 53.7 (H) 05/19/2022   MCV 79.1 (L) 05/19/2022   PLT 241 05/19/2022    Lab Results  Component Value Date   TIBC 364 05/19/2022   TIBC 594 (H) 04/09/2022   TIBC 613 (H) 03/18/2022   FERRITIN 272 04/09/2022   FERRITIN 10 (L) 03/18/2022   FERRITIN 25 08/13/2021   IRONPCTSAT 17 (L) 05/19/2022   IRONPCTSAT 10 (L) 04/09/2022   IRONPCTSAT 4 (L) 03/18/2022     STUDIES: No results found.  ASSESSMENT AND PLAN:   Joshua Roy is a 71 y.o. male with pmh of HFpEF, CAD s/p  CABG, diabetes, hyperlipidemia and  A-fib was referred to hematology clinic for further management of iron deficiency anemia.  #Iron deficiency anemia -From 03/18/2022, ferritin 10 saturation 4%.  Received IV Feraheme 550 mg x 2.  Completed 03/27/2022. -Repeat iron panel showed good response to IV iron.  Ferritin 272, saturation 17%.  Hemoglobin 16.7.  MCV 79. -He is taking oral iron every day.  Advised to take it Monday Wednesday and Friday.  - Last colonoscopy was in 2019 which showed tubular adenoma in ascending, descending and sigmoid colon.  He was due for repeat colonoscopy last year however he misunderstood about stopping his blood thinner as a result he could not get the procedure.  He is scheduled to see Dr. Allen Norris in January 2023 for colonoscopy.  -Vitamin B12 level ~400.  Continue with oral B12 1000 mcg daily.  #CKD Stage 4 -Follows with Dr. Holley Raring.  He is on diuretics.  #Afib -On Eliquis 5 mg twice daily.  Had cardioversion last week.  He is looking for a new primary.  He reached out to Dr. Tressia Miners office and has not heard back yet.  Orders Placed This Encounter  Procedures   CBC with Differential   Ferritin   Iron and TIBC([facility])   Vitamin B12   RTC in 5 months for MD visit, labs.  Patient expressed understanding and was in agreement with this plan. He also understands that He can call clinic at any time with any questions, concerns, or complaints.   I spent a total of 30 minutes reviewing chart data, face-to-face evaluation with the patient, counseling and coordination of care as detailed above.  Jane Canary, MD   05/26/2022 11:24 AM

## 2022-05-28 ENCOUNTER — Ambulatory Visit: Payer: Medicare Other | Admitting: Family

## 2022-06-03 ENCOUNTER — Ambulatory Visit: Payer: Medicare Other | Admitting: Internal Medicine

## 2022-06-10 ENCOUNTER — Encounter: Payer: Self-pay | Admitting: Medical

## 2022-06-10 ENCOUNTER — Ambulatory Visit: Payer: Medicare Other | Attending: Medical | Admitting: Medical

## 2022-06-10 VITALS — BP 110/70 | HR 77 | Ht 70.0 in | Wt 220.8 lb

## 2022-06-10 DIAGNOSIS — N184 Chronic kidney disease, stage 4 (severe): Secondary | ICD-10-CM | POA: Diagnosis not present

## 2022-06-10 DIAGNOSIS — I5032 Chronic diastolic (congestive) heart failure: Secondary | ICD-10-CM | POA: Insufficient documentation

## 2022-06-10 DIAGNOSIS — I4891 Unspecified atrial fibrillation: Secondary | ICD-10-CM | POA: Diagnosis not present

## 2022-06-10 DIAGNOSIS — R0683 Snoring: Secondary | ICD-10-CM | POA: Insufficient documentation

## 2022-06-10 DIAGNOSIS — I2510 Atherosclerotic heart disease of native coronary artery without angina pectoris: Secondary | ICD-10-CM | POA: Insufficient documentation

## 2022-06-10 MED ORDER — AMIODARONE HCL 200 MG PO TABS: ORAL_TABLET | ORAL | 1 refills | Status: DC

## 2022-06-10 NOTE — Progress Notes (Unsigned)
Cardiology Office Note:    Date:  06/12/2022   ID:  Joshua Roy, DOB 04-Mar-1951, MRN [id number]  PCP:  [name], MD  [facility] Cardiologist:  Ida Rogue, MD  [facility] Electrophysiologist:  None   Referring MD: [name], MD   Chief Complaint: 3 week follow-up  History of Present Illness:    Joshua Roy is a 71 y.o. male with a hx of CAD status post CABG in 09/2019 with postoperative A-fib, HFpEF, DM 2, carotid artery disease status post CEA in 09/2019, CKD stage IV, HTN, HLD, obesity, and COPD with ongoing tobacco use who is being seen for 1 month follow-up.    Mr. [name] was admitted to the hospital in 2021 with presyncope followed by brief episode of syncope, though left AMA.  He was subsequently found to be volume overloaded with EF at that time showing preserved LV systolic function with diastolic dysfunction and no significant valvular disease.  LHC showed severe multivessel CAD.  He underwent CABG x4 in 09/2019.  Preoperative work-up showed severe right ICA stenosis was treated with CEA at the time of his CABG.  Postoperative course was noted for A-fib which was treated with amiodarone without recurrence.  He had a subsequent admission in 11/2019 with CHF exacerbation and multiorgan failure requiring significant supplemental oxygen at 2 L throughout his course.  Note indicates the patient was "in denial as to his numerous medical issues."  He has not required ischemic evaluation since his CABG.  He was admitted to the hospital in 07/2021 with acute respiratory failure secondary to COVID-pneumonia and HFpEF.  Echo during that admission demonstrated an EF of 55 to 60%, indeterminate LV diastolic function parameters, normal RV systolic function and ventricular cavity size, mild biatrial enlargement, and aortic valve sclerosis without evidence of stenosis.  He was seen in 11/2021 with an increase in lower extremity edema that improved with titration of  furosemide and addition of [place].   He was admitted to [facility] on 04/01/2022 with a 2-week history of increased shortness of breath and lower extremity swelling.  He was without symptoms of frank chest pain, palpitations, presyncope, or syncope.  He had gained approximately 20 pounds over that 2-week timeframe despite escalation of furosemide.  He reported adherence to his medications.  Upon admission, he was noted to be hypoxic with oxygen saturation of 78%, potassium 5.2 trending to 6.0, serum creatinine 3.94 trending to 5.45, BNP 267, and high-sensitivity troponin 22x2.  EKG showed new onset atrial flutter with variable AV block. He was managed with Lasix drip with the assistance of nephrology with improvement in serum creatinine. Echo showed an EF of 60 to 65%, severe LVH, mildly reduced RV systolic function with moderately enlarged ventricular cavity size, severely elevated PASP estimated at 78.4 mmHg, upper normal left atrial size, mildly dilated right atrium, trivial mitral regurgitation, mild to moderate tricuspid regurgitation, aortic valve sclerosis without evidence of stenosis, borderline dilatation of the aortic root, and an estimated right atrial pressure 15 mmHg. He was started on metoprolol '25mg'$ BID and Eliquis '5mg'$  BID with plan for an outpatient cardioversion after 4 weeks. Scr on discharge was 3.25. He was discharged on lasix '80mg'$  in the am and 40 in the pm.    He was seen in the office 04/08/22 and had not started his Eliquis. He was rate controlled. Plan to start Eliquis and re-evaluate for cardioversion at follow-up.  Patient was seen 05/15/2022 and was in rate controlled A-fib.  Reported weight loss of  30 to 40 pounds with diet and exercise.  Patient was set up for cardioversion.  Patient underwent successful cardioversion on 05/22/2022.  Today, the patient is back in Afib with rates in the 70s. He denies chest pain, shortness of breath. No lower leg edema, palpitations, orthopnea or pnd. The  patient is willing to see pulmonary for sleep apnea management.   Past Medical History:  Diagnosis Date   (HFpEF) heart failure with preserved ejection fraction ([name])    a. 07/2019 Echo: EF 60-65%. Mod LVH. Gr1 DD. No rwma. Nl RV size/fxn. Mildly dil LA; b. 11/2019 Echo: EF 55-60%, no rwma, mild LVH. Nl RV size/fxn. Mod dil LA.   ACE-inhibitor cough    Anemia    Anxiety    Arrhythmia    atrial flutter   Bronchitis 06/27/2016   Carotid arterial disease ([place])    a. 09/2019 Carotid U/S: RICA 50-53%, LICA 9-76%; b. 01/3418 R CEA.   CHF (congestive heart failure) (HCC)    CKD (chronic kidney disease), stage III (HCC)    COPD (chronic obstructive pulmonary disease) (HCC)    cath, mild inf. hypokinesis EF 49%, 100% ROA   Coronary artery disease    a. 2001 Cath: LM 10, LAD 60p/m, D1 30, LCX 20p, RCA 128md-->Med Rx; b. 05/2017 Ex Mv: Ex time 5:46, antlat twi @ rest, fixed infarct w/ mild peri-infarct ischemia. EF 38%; c. 08/2019 Cath: LM 40ost, 70d, LAD 737mLCX 8047mCA 80-90p, 100m12m 09/2019 CABG x4: LIMA->LAD, VG->RI, VG->OM, VG->RPDA.   Diabetes mellitus type II    Hyperlipidemia    Hypertension    [facility] (osteoarthritis)    knee OA, injected 2012 by ortho   PAF (paroxysmal atrial fibrillation) (HCC)[place] a. 09/2019 post-op CABG-->[place].   Pneumonia    PSVT (paroxysmal supraventricular tachycardia)    a. 08/2019 Zio: 189 brief runs of SVT (fastest 174 x 6 beats, longest 15 beats @ 107).   Tobacco abuse     Past Surgical History:  Procedure Laterality Date   C[facility]  [facility]   [facility] 05/22/2022   Procedure: CARDIOVERSION;  Surgeon: GollMinna Merritts;  Location: [facility] ORS;  Service: Cardiovascular;  Laterality: N/A;   COLONOSCOPY WITH PROPOFOL N/A 04/20/2018   Procedure: COLONOSCOPY WITH PROPOFOL;  Surgeon: WohlLucilla Lame;  Location: [facility] ENDOSCOPY;  Service: Endoscopy;  Laterality: N/A;   CORONARY ARTERY  BYPASS GRAFT N/A 10/07/2019   Procedure: CORONARY ARTERY BYPASS GRAFTING (CABG) using LIMA to LAD; Endoscopic harvesting of left greater saphenous vein: SVG to RAMUS; SVG to OM1; SVG to PDA.;  Surgeon: BartGaye Pollack;  [facility]  Service: Open Heart Surgery;  Laterality: N/A;   ENDARTERECTOMY Right 10/07/2019   Procedure: ENDARTERECTOMY CAROTID;  Surgeon: EarlRosetta Posner;  [facility]  Service: Vascular;  Laterality: Right;   ENDOVEIN HARVEST OF GREATER SAPHENOUS VEIN Left 10/07/2019   Procedure: EndoCharleston RopesGreater Saphenous Vein;  Surgeon: BartGaye Pollack;  [facility]  Service: Open Heart Surgery;  Laterality: Left;   ESOPHAGOGASTRODUODENOSCOPY ENDOSCOPY     KNEE ARTHROSCOPY  07/25/04   left   KNEENapeagueight, open surgery- repair   RIGHT/LEFT HEART CATH AND CORONARY ANGIOGRAPHY Bilateral 09/08/2019   Procedure: RIGHT/LEFT HEART CATH AND CORONARY ANGIOGRAPHY;  Surgeon: GollMinna Merritts;  [facility];  Service: Cardiovascular;  Laterality:  Bilateral;   TEE WITHOUT CARDIOVERSION N/A 10/07/2019   Procedure: TRANSESOPHAGEAL ECHOCARDIOGRAM (TEE);  Surgeon: Gaye Pollack, MD;  Location: [facility];  Service: Open Heart Surgery;  Laterality: N/A;   TOTAL KNEE ARTHROPLASTY Left 07/14/2016   Procedure: LEFT TOTAL KNEE ARTHROPLASTY;  Surgeon: Gaynelle Arabian, MD;  Location: [facility];  Service: Orthopedics;  Laterality: Left;   TOTAL KNEE ARTHROPLASTY Right 07/13/2017   Procedure: RIGHT TOTAL KNEE ARTHROPLASTY;  Surgeon: Gaynelle Arabian, MD;  Location: [facility];  Service: Orthopedics;  Laterality: Right;    Current Medications: Current Meds  Medication Sig   ALPRAZolam (XANAX) 0.25 MG tablet Take 1 tablet (0.25 mg total) by mouth 2 (two) times daily as needed for anxiety.   amiodarone (PACERONE) 200 MG tablet Take 400 mg (2 tablets) twice a day for 1 week, then 200 mg (1 tablet) twice a day for 1 week, then finally increase to 200 mg (1 tablet)  daily.   apixaban (ELIQUIS) 5 MG TABS tablet Take 1 tablet (5 mg total) by mouth 2 (two) times daily.   atorvastatin (LIPITOR) 80 MG tablet TAKE 1 TABLET BY MOUTH EVERY DAY   cyanocobalamin (VITAMIN B12) 1000 MCG tablet Take 1,000 mcg by mouth daily.   dapagliflozin propanediol (FARXIGA) 10 MG TABS tablet Take 2 tablets (20 mg total) by mouth daily at 2 PM.   furosemide (LASIX) 40 MG tablet Take 2 tablets in the morning and 1 tablet at night that is 80 mg in the morning and 40 mg at night   glipiZIDE (GLUCOTROL) 5 MG tablet Take 1 tablet (5 mg total) by mouth 2 (two) times daily before a meal.   glucose blood (TRUE METRIX BLOOD GLUCOSE TEST) test strip 1 each by Other route daily. Use as instructed   iron polysaccharides (NIFEREX) 150 MG capsule Take 1 capsule (150 mg total) by mouth daily.   metoprolol tartrate (LOPRESSOR) 25 MG tablet Take 1 tablet (25 mg total) by mouth 2 (two) times daily.   pantoprazole (PROTONIX) 40 MG tablet TAKE 1 TABLET BY MOUTH DAILY BEFORE BREAKFAST     Allergies:   Ramipril and Pseudoephedrine-guaifenesin er   Social History   Socioeconomic History   Marital status: Married    Spouse name: Janie   Number of children: 3   Years of education: Not on file   Highest education level: Not on file  Occupational History   Occupation: Engineer, manufacturing, Estate agent  Tobacco Use   Smoking status: Every Day    Packs/day: 1.00    Years: 52.00    Total pack years: 52.00    Types: Cigarettes   Smokeless tobacco: Never   Tobacco comments:    per patient wears a patch and has cut down on cigarettes/day (10/05/2019)  Vaping Use   Vaping Use: Never used  Substance and Sexual Activity   Alcohol use: Yes    Alcohol/week: 0.0 standard drinks of alcohol    Comment: 5-6 cocktails, 1 times a week   Drug use: No   Sexual activity: Not on file  Other Topics Concern   Not on file  Social History Narrative   From [place].  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    Social Determinants of Health   Financial Resource Strain: Low Risk  (06/14/2021)   Overall Financial Resource Strain (CARDIA)    Difficulty of Paying Living Expenses: Not hard at all  Food Insecurity: No Food Insecurity (04/27/2020)   Hunger Vital Sign    Worried  About Running Out of Food in the Last Year: Never true    Ran Out of Food in the Last Year: Never true  Transportation Needs: No Transportation Needs (06/14/2021)   PRAPARE - Hydrologist (Medical): No    Lack of Transportation (Non-Medical): No  Physical Activity: Insufficiently Active (06/14/2021)   Exercise Vital Sign    Days of Exercise per Week: 2 days    Minutes of Exercise per Session: 20 min  Stress: No Stress Concern Present (06/14/2021)   [place]    Feeling of Stress : Not at all  Social Connections: Moderately Isolated (06/14/2021)   Social Connection and Isolation Panel [NHANES]    Frequency of Communication with Friends and Family: More than three times a week    Frequency of Social Gatherings with Friends and Family: More than three times a week    Attends Religious Services: Never    Marine scientist or Organizations: No    Attends Music therapist: Never    Marital Status: Married     Family History: The patient's family history includes Alcohol abuse in his father; Cancer in his brother; Colon cancer in his brother; Diabetes in his father and sister; Heart disease in his father and sister; Hypertension in his father. There is no history of Prostate cancer.  ROS:   Please see the history of present illness.     All other systems reviewed and are negative.  EKGs/Labs/Other Studies Reviewed:    The following studies were reviewed today:  Echo 04/01/22 1. Left ventricular ejection fraction, by estimation, is 60 to 65%. The  left ventricle has normal function. Left ventricular  endocardial border  not optimally defined to evaluate regional wall motion. There is severe  left ventricular hypertrophy. Left  ventricular diastolic parameters are indeterminate.   2. Right ventricular systolic function is mildly reduced. The right  ventricular size is moderately enlarged. Severely increased right  ventricular wall thickness. There is severely elevated pulmonary artery  systolic pressure. The estimated right  ventricular systolic pressure is 47.8 mmHg.   3. Left atrial size was upper normal.   4. Right atrial size was mildly dilated.   5. The mitral valve is abnormal. Trivial mitral valve regurgitation.   6. Tricuspid valve regurgitation is mild to moderate.   7. The aortic valve is tricuspid. There is moderate calcification of the  aortic valve. There is moderate thickening of the aortic valve. Aortic  valve regurgitation is trivial. Aortic valve sclerosis/calcification is  present, without any evidence of  aortic stenosis.   8. Aortic dilatation noted. There is borderline dilatation of the aortic  root, measuring 38 mm.   9. The inferior vena cava is dilated in size with <50% respiratory  variability, suggesting right atrial pressure of 15 mmHg.   EKG:  EKG is ordered today.  The ekg ordered today demonstrates Afib 73bpm, RAD, PVCs, nonspecific T wave changes  Recent Labs: 08/12/2021: Magnesium 1.9 03/18/2022: ALT 16 04/01/2022: B Natriuretic Peptide 267.2; TSH 2.088 05/19/2022: BUN 36; Creatinine, Ser 2.56; Hemoglobin 16.7; Platelets 241; Potassium 3.7; Sodium 140  Recent Lipid Panel    Component Value Date/Time   CHOL 124 06/13/2021 0834   CHOL 113 05/14/2013 0252   TRIG 138 06/13/2021 0834   TRIG 232 (H) 05/14/2013 0252   HDL 39 (L) 06/13/2021 0834   HDL 23 (L) 05/14/2013 0252   CHOLHDL 3.2 06/13/2021 2956  VLDL 19 10/11/2019 0254   VLDL 46 (H) 05/14/2013 0252   LDLCALC 63 06/13/2021 0834   LDLCALC 44 05/14/2013 0252   LDLDIRECT 109.0 11/15/2014 0851      Physical Exam:    VS:  BP 110/70 (BP Location: Left Arm, Patient Position: Sitting, Cuff Size: Normal)   Pulse 77   Ht '5\' 10"'$  (1.778 m)   Wt 220 lb 12.8 oz (100.2 kg)   SpO2 93%   BMI 31.68 kg/m     Wt Readings from Last 3 Encounters:  06/10/22 220 lb 12.8 oz (100.2 kg)  05/26/22 223 lb 6.4 oz (101.3 kg)  05/15/22 225 lb (102.1 kg)     GEN:  Well nourished, well developed in no acute distress HEENT: Normal NECK: No JVD; No carotid bruits LYMPHATICS: No lymphadenopathy CARDIAC: Irreg Irreg, no murmurs, rubs, gallops RESPIRATORY:  Clear to auscultation without rales, wheezing or rhonchi  ABDOMEN: Soft, non-tender, non-distended MUSCULOSKELETAL:  No edema; No deformity  SKIN: Warm and dry NEUROLOGIC:  Alert and oriented x 3 PSYCHIATRIC:  Normal affect   ASSESSMENT:    1. Atrial fibrillation, unspecified type ([place])   2. Snoring   3. Coronary artery disease involving native coronary artery of native heart without angina pectoris   4. Chronic diastolic heart failure ([place])   5. CKD (chronic kidney disease) stage 4, GFR 15-29 ml/min (HCC)    PLAN:    In order of problems listed above:  Afib/flutter Patient underwent recent cardioversion 10/26.  Today, EKG shows he is back in A-fib with a rate of 73 bpm.  Case was discussed with Dr. Rockey Situ.  The patient reports he has been compliant with Eliquis.  He takes Lopressor 25 mg twice daily for rate control.  We will start amiodarone 400 mg twice daily for 1 week, 200 mg twice daily for 1 week, followed by 200 mg daily thereafter.  I will also refer patient to EP.  We will also refer patient to pulmonology for possible sleep study/sleep apnea management.  CAD s/p CABG Patient denies anginal symptoms.  Patient has lost a lot of weight.  He plans on continuing to lose weight.  No aspirin with Eliquis.  Continue Lipitor and Lopressor. No further ischemic work-up indicated at this time.   Chronic diastolic CHF The patient is  euvolemic on exam. Continue Farxiga '10mg'$  daily, Lopressor '25mg'$  BID, and lasix.   CKD stage 4 Most recent labs showed Scr 2.56/BUN 36.   Disposition: Follow up in 3 week(s) with MD/APP    Signed, Shariff Lasky Ninfa Meeker, PA-C  06/12/2022 9:17 AM    [facility]

## 2022-06-10 NOTE — Patient Instructions (Addendum)
Medication Instructions:  START: Amiodarone 400 mg (2 tablets) twice a day for 1 week, then 200 mg (1 tablet) twice a day for 1 week, then finally decrease to 200 mg (1 tablet) daily.   *If you need a refill on your cardiac medications before your next appointment, please call your pharmacy*   Lab Work: None ordered today   Testing/Procedures: None ordered today   Follow-Up: At [facility], you and your health needs are our priority.  As part of our continuing mission to provide you with exceptional heart care, we have created designated Provider Care Teams.  These Care Teams include your primary Cardiologist (physician) and Advanced Practice Providers (APPs -  Physician Assistants and Nurse Practitioners) who all work together to provide you with the care you need, when you need it.  We recommend signing up for the patient portal called "MyChart".  Sign up information is provided on this After Visit Summary.  MyChart is used to connect with patients for Virtual Visits (Telemedicine).  Patients are able to view lab/test results, encounter notes, upcoming appointments, etc.  Non-urgent messages can be sent to your provider as well.   To learn more about what you can do with MyChart, go to [email or web address].    Your next appointment:   3-4 week(s)  The format for your next appointment:   In Person  Provider:   Cadence Kathlen Mody, PA-C

## 2022-06-12 ENCOUNTER — Other Ambulatory Visit: Payer: Self-pay

## 2022-06-12 DIAGNOSIS — I4891 Unspecified atrial fibrillation: Secondary | ICD-10-CM

## 2022-06-12 MED ORDER — APIXABAN 5 MG PO TABS: 5.0000 mg | ORAL_TABLET | Freq: Two times a day (BID) | ORAL | 1 refills | Status: DC

## 2022-06-12 NOTE — Telephone Encounter (Signed)
Prescription refill request for Eliquis received. Indication: Afib  Last office visit: 06/10/22 Kathlen Mody)  Scr: 2.56 (05/19/22)  Age: 71 Weight: 100.2kg  Appropriate dose and refill sent to requested pharmacy.

## 2022-06-16 ENCOUNTER — Encounter: Payer: Medicare Other | Admitting: Internal Medicine

## 2022-06-17 ENCOUNTER — Other Ambulatory Visit: Payer: Self-pay | Admitting: Cardiovascular Disease

## 2022-06-18 ENCOUNTER — Telehealth: Payer: Self-pay | Admitting: Cardiovascular Disease

## 2022-06-18 DIAGNOSIS — I4891 Unspecified atrial fibrillation: Secondary | ICD-10-CM

## 2022-06-18 NOTE — Telephone Encounter (Signed)
Pt c/o medication issue:  1. Name of Medication: apixaban (ELIQUIS) 5 MG TABS tablet   2. How are you currently taking this medication (dosage and times per day)? As prescribed   3. Are you having a reaction (difficulty breathing--STAT)? No   4. What is your medication issue? Patient is requesting a refill be faxed to [facility] for a 90 day supply. The fax # [phone]. He states the medication is tremendously cheaper and he wants to get the name brand through them from now on moving forward. Please advise.

## 2022-06-23 NOTE — Telephone Encounter (Signed)
Patient calling back to follow up. He would like a call back when it is sent.

## 2022-06-23 NOTE — Telephone Encounter (Signed)
Left a message for the patient that we would call as soon as we had an answer for him.

## 2022-06-25 MED ORDER — APIXABAN 5 MG PO TABS: 5.0000 mg | ORAL_TABLET | Freq: Two times a day (BID) | ORAL | 1 refills | Status: DC

## 2022-06-25 NOTE — Telephone Encounter (Signed)
Patient came into office to get a status update on Eliquis refill from [place] [facility], states call center has not called him back.  Please assist

## 2022-06-25 NOTE — Telephone Encounter (Signed)
Left voicemail message to call back in regards to his prescription. Left message that I would fax to number provided and if he wants to pick it up then I will have it.

## 2022-06-26 NOTE — Telephone Encounter (Signed)
Left voicemail message that prescription has been faxed to number provided and to call back if any further questions.

## 2022-07-01 ENCOUNTER — Ambulatory Visit: Payer: Medicare Other | Attending: Medical | Admitting: Medical

## 2022-07-01 ENCOUNTER — Other Ambulatory Visit: Payer: Self-pay | Admitting: Medical

## 2022-07-01 ENCOUNTER — Encounter: Payer: Self-pay | Admitting: Medical

## 2022-07-01 VITALS — BP 132/64 | HR 60 | Ht 70.0 in | Wt 218.0 lb

## 2022-07-01 DIAGNOSIS — N184 Chronic kidney disease, stage 4 (severe): Secondary | ICD-10-CM | POA: Insufficient documentation

## 2022-07-01 DIAGNOSIS — I25118 Atherosclerotic heart disease of native coronary artery with other forms of angina pectoris: Secondary | ICD-10-CM | POA: Diagnosis not present

## 2022-07-01 DIAGNOSIS — I5032 Chronic diastolic (congestive) heart failure: Secondary | ICD-10-CM | POA: Insufficient documentation

## 2022-07-01 DIAGNOSIS — I4891 Unspecified atrial fibrillation: Secondary | ICD-10-CM | POA: Insufficient documentation

## 2022-07-01 MED ORDER — AMIODARONE HCL 200 MG PO TABS: 200.0000 mg | ORAL_TABLET | Freq: Every day | ORAL | 0 refills | Status: DC

## 2022-07-01 MED ORDER — DAPAGLIFLOZIN PROPANEDIOL 10 MG PO TABS: 10.0000 mg | ORAL_TABLET | Freq: Every day | ORAL | 3 refills | Status: DC

## 2022-07-01 MED ORDER — DAPAGLIFLOZIN PROPANEDIOL 10 MG PO TABS: 20.0000 mg | ORAL_TABLET | Freq: Every day | ORAL | 3 refills | Status: DC

## 2022-07-01 NOTE — Patient Instructions (Signed)
Medication Instructions:   Your physician recommends that you continue on your current medications as directed. Please refer to the Current Medication list given to you today.  *If you need a refill on your cardiac medications before your next appointment, please call your pharmacy*   Lab Work:  1, none Ordered  If you have labs (blood work) drawn today and your tests are completely normal, you will receive your results only by: [place] (if you have MyChart) OR A paper copy in the mail If you have any lab test that is abnormal or we need to change your treatment, we will call you to review the results.   Testing/Procedures:  None Ordered   Follow-Up: At [facility], you and your health needs are our priority.  As part of our continuing mission to provide you with exceptional heart care, we have created designated Provider Care Teams.  These Care Teams include your primary Cardiologist (physician) and Advanced Practice Providers (APPs -  Physician Assistants and Nurse Practitioners) who all work together to provide you with the care you need, when you need it.  We recommend signing up for the patient portal called "MyChart".  Sign up information is provided on this After Visit Summary.  MyChart is used to connect with patients for Virtual Visits (Telemedicine).  Patients are able to view lab/test results, encounter notes, upcoming appointments, etc.  Non-urgent messages can be sent to your provider as well.   To learn more about what you can do with MyChart, go to [email or web address].    Your next appointment:   3 month(s)  The format for your next appointment:   In Person  Provider:   Ida Rogue, MD

## 2022-07-01 NOTE — Progress Notes (Signed)
Cardiology Office Note:    Date:  07/01/2022   ID:  Joshua Roy, DOB [birth year], MRN [id number]  PCP:  Joshua Athens, MD  [facility] Cardiologist:  Ida Rogue, MD  [facility] Electrophysiologist:  None   Referring MD: Joshua Athens, MD   Chief Complaint: Afib follow-up  History of Present Illness:    Joshua Roy is a 71 y.o. male with a hx of CAD status post CABG in 09/2019 with postoperative A-fib, HFpEF, DM 2, carotid artery disease status post CEA in 09/2019, CKD stage IV, HTN, HLD, obesity, and COPD with ongoing tobacco use who is being seen for 1 month follow-up.    Mr. Joshua Roy was admitted to the hospital in 2021 with presyncope followed by brief episode of syncope, though left AMA.  He was subsequently found to be volume overloaded with EF at that time showing preserved LV systolic function with diastolic dysfunction and no significant valvular disease.  LHC showed severe multivessel CAD.  He underwent CABG x4 in 09/2019.  Preoperative work-up showed severe right ICA stenosis was treated with CEA at the time of his CABG.  Postoperative course was noted for A-fib which was treated with amiodarone without recurrence.  He had a subsequent admission in 11/2019 with CHF exacerbation and multiorgan failure requiring significant supplemental oxygen at 2 L throughout his course.  Note indicates the patient was "in denial as to his numerous medical issues."  He has not required ischemic evaluation since his CABG.  He was admitted to the hospital in 07/2021 with acute respiratory failure secondary to COVID-pneumonia and HFpEF.  Echo during that admission demonstrated an EF of 55 to 60%, indeterminate LV diastolic function parameters, normal RV systolic function and ventricular cavity size, mild biatrial enlargement, and aortic valve sclerosis without evidence of stenosis.  He was seen in 11/2021 with an increase in lower extremity edema that improved with titration of  furosemide and addition of [place].   He was admitted to [facility] on 04/01/2022 with a 2-week history of increased shortness of breath and lower extremity swelling.  He was without symptoms of frank chest pain, palpitations, presyncope, or syncope.  He had gained approximately 20 pounds over that 2-week timeframe despite escalation of furosemide.  He reported adherence to his medications.  Upon admission, he was noted to be hypoxic with oxygen saturation of 78%, potassium 5.2 trending to 6.0, serum creatinine 3.94 trending to 5.45, BNP 267, and high-sensitivity troponin 22x2.  EKG showed new onset atrial flutter with variable AV block. He was managed with Lasix drip with the assistance of nephrology with improvement in serum creatinine. Echo showed an EF of 60 to 65%, severe LVH, mildly reduced RV systolic function with moderately enlarged ventricular cavity size, severely elevated PASP estimated at 78.4 mmHg, upper normal left atrial size, mildly dilated right atrium, trivial mitral regurgitation, mild to moderate tricuspid regurgitation, aortic valve sclerosis without evidence of stenosis, borderline dilatation of the aortic root, and an estimated right atrial pressure 15 mmHg. He was started on metoprolol '25mg'$ BID and Eliquis '5mg'$  BID with plan for an outpatient cardioversion after 4 weeks. Scr on discharge was 3.25. He was discharged on lasix '80mg'$  in the am and 40 in the pm.    He was seen in the office 04/08/22 and had not started his Eliquis. He was rate controlled. Plan to start Eliquis and re-evaluate for cardioversion at follow-up.   Patient was seen 05/15/2022 and was in rate controlled A-fib.  Reported weight loss of  30 to 40 pounds with diet and exercise.  Patient was set up for cardioversion.  Patient underwent successful cardioversion on 05/22/2022.  He was last seen 06/10/22 and was back in afib and started on amiodarone. The patient was referred to EP, and pulmonology for sleep study.   Today,  patient reports he is overall doing well.  EKG shows sinus rhythm with a heart rate of 60 bpm, first-degree AV block.  Patient is taking amiodarone and Lopressor.  He reports he has lost about 3 more pounds.  Patient is walking every day and doing more activity.  He has made diet changes as well.  Patient is not wanting to see EP or pulmonology since he is back in afib.  Patient is requesting a prescription of Wilder Glade, appears his PCP sent in the wrong dose of Farxiga.  Past Medical History:  Diagnosis Date   (HFpEF) heart failure with preserved ejection fraction ([place])    a. 07/2019 Echo: EF 60-65%. Mod LVH. Gr1 DD. No rwma. Nl RV size/fxn. Mildly dil LA; b. 11/2019 Echo: EF 55-60%, no rwma, mild LVH. Nl RV size/fxn. Mod dil LA.   ACE-inhibitor cough    Anemia    Anxiety    Arrhythmia    atrial flutter   Bronchitis 06/27/2016   Carotid arterial disease ([place])    a. 09/2019 Carotid U/S: RICA 97-35%, LICA 3-29%; b. 03/2425 R CEA.   CHF (congestive heart failure) (HCC)    CKD (chronic kidney disease), stage III (HCC)    COPD (chronic obstructive pulmonary disease) (HCC)    cath, mild inf. hypokinesis EF 49%, 100% ROA   Coronary artery disease    a. 2001 Cath: LM 10, LAD 60p/m, D1 30, LCX 20p, RCA 133md-->Med Rx; b. 05/2017 Ex Mv: Ex time 5:46, antlat twi @ rest, fixed infarct w/ mild peri-infarct ischemia. EF 38%; c. 08/2019 Cath: LM 40ost, 70d, LAD 780mLCX 8039mCA 80-90p, 100m30m 09/2019 CABG x4: LIMA->LAD, VG->RI, VG->OM, VG->RPDA.   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 09/2019 post-op CABG-->Amio.   Pneumonia    PSVT (paroxysmal supraventricular tachycardia)    a. 08/2019 Zio: 189 brief runs of SVT (fastest 174 x 6 beats, longest 15 beats @ 107).   Tobacco abuse     Past Surgical History:  Procedure Laterality Date    [facility]  [facility] 05/22[facility]20[facility]   Procedure: CARDIOVERSION;  Surgeon: GollMinna Merritts;  Location: [facility];  Service: Cardiovascular;  Laterality: N/A;   COLONOSCOPY WITH PROPOFOL N/A 04/20/2018   Procedure: COLONOSCOPY WITH PROPOFOL;  Surgeon: WohlLucilla Lame;  Location: [facility] END[facility];  Service: Endoscopy;  Laterality: N/A;   CORONARY ARTERY BYPASS GRAFT N/A 10/07/2019   Procedure: CORONARY ARTERY BYPASS GRAFTING (CABG) using LIMA to LAD; Endoscopic harvesting of left greater saphenous vein: SVG to RAMUS; SVG to OM1; SVG to PDA.;  Surgeon: BartGaye Pollack;  [facility]  Service: Open Heart Surgery;  Laterality: N/A;   ENDARTERECTOMY Right 10/07/2019   Procedure: ENDARTERECTOMY CAROTID;  Surgeon: EarlRosetta Posner;  [facility]  Service: Vascular;  Laterality: Right;   ENDOVEIN HARVEST OF GREATER SAPHENOUS VEIN Left 10/07/2019   Procedure: EndoCharleston RopesGreater Saphenous Vein;  Surgeon: Gaye Pollack, MD;  Location: [place];  Service: Open Heart Surgery;  Laterality: Left;   ESOPHAGOGASTRODUODENOSCOPY ENDOSCOPY     KNEE ARTHROSCOPY  07/25/04   left   [place]   right, open surgery- repair   RIGHT/LEFT HEART CATH AND CORONARY ANGIOGRAPHY Bilateral 09/08/2019   Procedure: RIGHT/LEFT HEART CATH AND CORONARY ANGIOGRAPHY;  Surgeon: Minna Merritts, MD;  Location: [place] [facility];  Service: Cardiovascular;  Laterality: Bilateral;   TEE WITHOUT CARDIOVERSION N/A 10/07/2019   Procedure: TRANSESOPHAGEAL ECHOCARDIOGRAM (TEE);  Surgeon: Gaye Pollack, MD;  Location: [place];  Service: Open Heart Surgery;  Laterality: N/A;   TOTAL KNEE ARTHROPLASTY Left 07/14/2016   Procedure: LEFT TOTAL KNEE ARTHROPLASTY;  Surgeon: Gaynelle Arabian, MD;  Location: [facility];  Service: Orthopedics;  Laterality: Left;   TOTAL KNEE ARTHROPLASTY Right 07/13/2017   Procedure: RIGHT TOTAL KNEE ARTHROPLASTY;  Surgeon: Gaynelle Arabian, MD;  Location:  [facility];  Service: Orthopedics;  Laterality: Right;    Current Medications: Current Meds  Medication Sig   ALPRAZolam (XANAX) 0.25 MG tablet Take 1 tablet (0.25 mg total) by mouth 2 (two) times daily as needed for anxiety.   apixaban (ELIQUIS) 5 MG TABS tablet Take 1 tablet (5 mg total) by mouth 2 (two) times daily.   atorvastatin (LIPITOR) 80 MG tablet TAKE 1 TABLET BY MOUTH EVERY DAY   cyanocobalamin (VITAMIN B12) 1000 MCG tablet Take 1,000 mcg by mouth daily.   furosemide (LASIX) 40 MG tablet Take 2 tablets in the morning and 1 tablet at night that is 80 mg in the morning and 40 mg at night   glimepiride (AMARYL) 2 MG tablet TAKE 1/2 TABLET BY MOUTH IN THE MORNING   glipiZIDE (GLUCOTROL) 5 MG tablet Take 1 tablet (5 mg total) by mouth 2 (two) times daily before a meal.   glucose blood (TRUE METRIX BLOOD GLUCOSE TEST) test strip 1 each by Other route daily. Use as instructed   iron polysaccharides (NIFEREX) 150 MG capsule Take 1 capsule (150 mg total) by mouth daily.   metoprolol tartrate (LOPRESSOR) 25 MG tablet Take 1 tablet (25 mg total) by mouth 2 (two) times daily.   pantoprazole (PROTONIX) 40 MG tablet TAKE 1 TABLET BY MOUTH DAILY BEFORE BREAKFAST   [DISCONTINUED] amiodarone (PACERONE) 200 MG tablet Take 400 mg (2 tablets) twice a day for 1 week, then 200 mg (1 tablet) twice a day for 1 week, then finally increase to 200 mg (1 tablet) daily.   [DISCONTINUED] dapagliflozin propanediol (FARXIGA) 10 MG TABS tablet Take 2 tablets (20 mg total) by mouth daily at 2 PM.     Allergies:   Ramipril and Pseudoephedrine-guaifenesin er   Social History   Socioeconomic History   Marital status: Married    Spouse name: Janie   Number of children: 3   Years of education: Not on file   Highest education level: Not on file  Occupational History   Occupation: Engineer, manufacturing, Estate agent  Tobacco Use   Smoking status: Every Day    Packs/day: 1.00    Years: 52.00    Total pack years: 52.00     Types: Cigarettes   Smokeless tobacco: Never   Tobacco comments:    per patient wears a patch and has cut down on cigarettes/day (10/05/2019)  Vaping Use   Vaping Use: Never used  Substance and Sexual Activity   Alcohol use: Yes    Alcohol/week: 0.0 standard drinks of alcohol  Comment: 5-6 cocktails, 1 times a week   Drug use: No   Sexual activity: Not on file  Other Topics Concern   Not on file  Social History Narrative   From [place].  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   Social Determinants of Health   Financial Resource Strain: Low Risk  (06/14/2021)   Overall Financial Resource Strain (CARDIA)    Difficulty of Paying Living Expenses: Not hard at all  Food Insecurity: No Food Insecurity (04/27/2020)   Hunger Vital Sign    Worried About Running Out of Food in the Last Year: Never true    Ran Out of Food in the Last Year: Never true  Transportation Needs: No Transportation Needs (06/14/2021)   PRAPARE - Hydrologist (Medical): No    Lack of Transportation (Non-Medical): No  Physical Activity: Insufficiently Active (06/14/2021)   Exercise Vital Sign    Days of Exercise per Week: 2 days    Minutes of Exercise per Session: 20 min  Stress: No Stress Concern Present (06/14/2021)   [place]    Feeling of Stress : Not at all  Social Connections: Moderately Isolated (06/14/2021)   Social Connection and Isolation Panel [NHANES]    Frequency of Communication with Friends and Family: More than three times a week    Frequency of Social Gatherings with Friends and Family: More than three times a week    Attends Religious Services: Never    Marine scientist or Organizations: No    Attends Music therapist: Never    Marital Status: Married     Family History: The patient's family history includes Alcohol abuse in his father; Cancer in his brother; Colon  cancer in his brother; Diabetes in his father and sister; Heart disease in his father and sister; Hypertension in his father. There is no history of Prostate cancer.  ROS:   Please see the history of present illness.     All other systems reviewed and are negative.  EKGs/Labs/Other Studies Reviewed:    The following studies were reviewed today:  Echo 04/01/22 1. Left ventricular ejection fraction, by estimation, is 60 to 65%. The  left ventricle has normal function. Left ventricular endocardial border  not optimally defined to evaluate regional wall motion. There is severe  left ventricular hypertrophy. Left  ventricular diastolic parameters are indeterminate.   2. Right ventricular systolic function is mildly reduced. The right  ventricular size is moderately enlarged. Severely increased right  ventricular wall thickness. There is severely elevated pulmonary artery  systolic pressure. The estimated right  ventricular systolic pressure is 60.6 mmHg.   3. Left atrial size was upper normal.   4. Right atrial size was mildly dilated.   5. The mitral valve is abnormal. Trivial mitral valve regurgitation.   6. Tricuspid valve regurgitation is mild to moderate.   7. The aortic valve is tricuspid. There is moderate calcification of the  aortic valve. There is moderate thickening of the aortic valve. Aortic  valve regurgitation is trivial. Aortic valve sclerosis/calcification is  present, without any evidence of  aortic stenosis.   8. Aortic dilatation noted. There is borderline dilatation of the aortic  root, measuring 38 mm.   9. The inferior vena cava is dilated in size with <50% respiratory  variability, suggesting right atrial pressure of 15 mmHg.    EKG:  EKG is ordered today.  The ekg ordered  today demonstrates NSR, 1st degree AV block, RAD, nonspecific T wave changes, no change since prior  Recent Labs: 08/12/2021: Magnesium 1.9 03/18/2022: ALT 16 04/01/2022: B Natriuretic Peptide  267.2; TSH 2.088 05/19/2022: BUN 36; Creatinine, Ser 2.56; Hemoglobin 16.7; Platelets 241; Potassium 3.7; Sodium 140  Recent Lipid Panel    Component Value Date/Time   CHOL 124 06/13/2021 0834   CHOL 113 05/14/2013 0252   TRIG 138 06/13/2021 0834   TRIG 232 (H) 05/14/2013 0252   HDL 39 (L) 06/13/2021 0834   HDL 23 (L) 05/14/2013 0252   CHOLHDL 3.2 06/13/2021 0834   VLDL 19 10/11/2019 0254   VLDL 46 (H) 05/14/2013 0252   LDLCALC 63 06/13/2021 0834   LDLCALC 44 05/14/2013 0252   LDLDIRECT 109.0 11/15/2014 0851    Physical Exam:    VS:  BP 132/64 (BP Location: Left Arm, Patient Position: Sitting, Cuff Size: Normal)   Pulse 60   Ht '5\' 10"'$  (1.778 m)   Wt 218 lb (98.9 kg)   BMI 31.28 kg/m     Wt Readings from Last 3 Encounters:  07/01/22 218 lb (98.9 kg)  06/10/22 220 lb 12.8 oz (100.2 kg)  05/26/22 223 lb 6.4 oz (101.3 kg)     GEN:  Well nourished, well developed in no acute distress HEENT: Normal NECK: No JVD; No carotid bruits LYMPHATICS: No lymphadenopathy CARDIAC: RRR, no murmurs, rubs, gallops RESPIRATORY:  Clear to auscultation without rales, wheezing or rhonchi  ABDOMEN: Soft, non-tender, non-distended MUSCULOSKELETAL:  No edema; No deformity  SKIN: Warm and dry NEUROLOGIC:  Alert and oriented x 3 PSYCHIATRIC:  Normal affect   ASSESSMENT:    1. Atrial fibrillation, unspecified type ([place])   2. Coronary artery disease of native artery of native heart with stable angina pectoris ([place])   3. Chronic diastolic heart failure ([place])   4. CKD (chronic kidney disease) stage 4, GFR 15-29 ml/min (HCC)    PLAN:    In order of problems listed above:  Paroxysmal A-fib/flutter Patient was started on amiodarone at the last visit and is in normal sinus rhythm by EKG today.  He had a prior failed cardioversion on 05/22/2022.  Patient reports he is overall feeling well.  Continue amiodarone 200 mg daily and Lopressor 25 mg daily.  Patient is not wanting to see EP at this time  since he is back in rhythm. Continue Eliquis '5mg'$  BID for stroke ppx.   CAD status post CABG Patient denies anginal symptoms. Patient is working on lifestyle change with diet and weight loss. No aspirin with Eliquis. Continue Lipitor and Lopressor.  No further ischemic workup indicated at this time.  Chronic diastolic heart failure The patient is euvolemic on exam. Continue Farxiga 10 mg daily, we will give him a prescription for this. Continue Lopressor 25 mg twice daily, and Lasix.  CKD stage IV Follows with nephrology. Serum creatinine around 2.5.  Disposition: Follow up in 3 month(s) with MD    Signed, Cristalle Rohm Ninfa Meeker, PA-C  07/01/2022 4:01 PM    [facility]

## 2022-07-16 ENCOUNTER — Other Ambulatory Visit: Payer: Self-pay | Admitting: Cardiovascular Disease

## 2022-07-16 ENCOUNTER — Other Ambulatory Visit: Payer: Self-pay | Admitting: Internal Medicine

## 2022-07-16 DIAGNOSIS — E785 Hyperlipidemia, unspecified: Secondary | ICD-10-CM

## 2022-07-29 ENCOUNTER — Encounter: Payer: Self-pay | Admitting: Internal Medicine

## 2022-07-31 DIAGNOSIS — N179 Acute kidney failure, unspecified: Secondary | ICD-10-CM | POA: Diagnosis not present

## 2022-07-31 DIAGNOSIS — E1122 Type 2 diabetes mellitus with diabetic chronic kidney disease: Secondary | ICD-10-CM | POA: Diagnosis not present

## 2022-08-04 DIAGNOSIS — N184 Chronic kidney disease, stage 4 (severe): Secondary | ICD-10-CM | POA: Diagnosis not present

## 2022-08-04 DIAGNOSIS — D751 Secondary polycythemia: Secondary | ICD-10-CM | POA: Diagnosis not present

## 2022-08-04 DIAGNOSIS — R809 Proteinuria, unspecified: Secondary | ICD-10-CM | POA: Diagnosis not present

## 2022-08-04 DIAGNOSIS — E1122 Type 2 diabetes mellitus with diabetic chronic kidney disease: Secondary | ICD-10-CM | POA: Diagnosis not present

## 2022-08-04 DIAGNOSIS — I10 Essential (primary) hypertension: Secondary | ICD-10-CM | POA: Diagnosis not present

## 2022-08-04 DIAGNOSIS — N2581 Secondary hyperparathyroidism of renal origin: Secondary | ICD-10-CM | POA: Diagnosis not present

## 2022-08-05 ENCOUNTER — Encounter: Payer: Self-pay | Admitting: Gastroenterology

## 2022-08-05 ENCOUNTER — Telehealth (INDEPENDENT_AMBULATORY_CARE_PROVIDER_SITE_OTHER): Payer: Medicare Other | Admitting: Gastroenterology

## 2022-08-05 ENCOUNTER — Telehealth: Payer: Self-pay

## 2022-08-05 DIAGNOSIS — D509 Iron deficiency anemia, unspecified: Secondary | ICD-10-CM

## 2022-08-05 DIAGNOSIS — D500 Iron deficiency anemia secondary to blood loss (chronic): Secondary | ICD-10-CM

## 2022-08-05 MED ORDER — PEG 3350-KCL-NABCB-NACL-NASULF 236 G PO SOLR: 4000.0000 mL | Freq: Once | ORAL | 0 refills | Status: AC

## 2022-08-05 NOTE — Telephone Encounter (Signed)
Clearance faxed to Dr Rockey Situ cardiologist

## 2022-08-05 NOTE — Progress Notes (Signed)
[name], MD [address]  [place]  [place], [place] [address]  Main: [phone]  Fax: [phone]    Gastroenterology Virtual/Video Visit  Referring Provider:     [name], MD Primary Care Physician:  [name], MD Primary Gastroenterologist:  Dr.Madinah Quarry Allen Norris Reason for Consultation:     Iron deficiency anemia        HPI:    Virtual Visit via Video Note Location of the patient:  Location of provider: Home  Participating persons: The patient and myself.  I connected with Joshua Roy on 08/05/22 at  2:00 PM EST by a video enabled telemedicine application and verified that I am speaking with the correct person using two identifiers.   I discussed the limitations of evaluation and management by telemedicine and the availability of in person appointments. The patient expressed understanding and agreed to proceed.  Verbal consent to proceed obtained.  History of Present Illness: Joshua Roy is a 72 y.o. male referred by Dr. [name], MD  for consultation & management of Iron deficiency anemia.  The patient has been found to have iron deficiency anemia and was treated with iron with good response to his iron levels.  The patient's hemoglobin was high at greater than 17.  The patient denies any black stools or bloody stools.  He also denies any nausea vomiting fevers or chills.  He does report that he has been eating more healthy and has been trying to lose weight and has lost 40 pounds.  There is no report of any other GI symptoms.  He was supposed to have a colonoscopy in the past but had, after his prep but did not stop his blood thinner at that time was sent home and never rescheduled.  Past Medical History:  Diagnosis Date   (HFpEF) heart failure with preserved ejection fraction (Joshua Roy)    a. 07/2019 Echo: EF 60-65%. Mod LVH. Gr1 DD. No rwma. Nl RV size/fxn. Mildly dil LA; b. 11/2019 Echo: EF 55-60%, no rwma, mild LVH. Nl RV size/fxn. Mod dil  LA.   ACE-inhibitor cough    Anemia    Anxiety    Arrhythmia    atrial flutter   Bronchitis 06/27/2016   Carotid arterial disease ([place])    a. 09/2019 Carotid U/S: RICA 78-67%, LICA 6-72%; b. 0/9470 R CEA.   CHF (congestive heart failure) (HCC)    CKD (chronic kidney disease), stage III (HCC)    COPD (chronic obstructive pulmonary disease) (HCC)    cath, mild inf. hypokinesis EF 49%, 100% ROA   Coronary artery disease    a. 2001 Cath: LM 10, LAD 60p/m, D1 30, LCX 20p, RCA 1100md-->Med Rx; b. 05/2017 Ex Mv: Ex time 5:46, antlat twi @ rest, fixed infarct w/ mild peri-infarct ischemia. EF 38%; c. 08/2019 Cath: LM 40ost, 70d, LAD 76mLCX 8072mCA 80-90p, 100m36m 09/2019 CABG x4: LIMA->LAD, VG->RI, VG->OM, VG->RPDA.   Diabetes mellitus type II    Hyperlipidemia    Hypertension    [facility]  32[facility] (osteoarthritis)    knee OA, injected 2012 by ortho   PAF (paroxysmal atrial fibrillation) (HCC)[place] a. 09/2019 post-op CABG-->Amio.   Pneumonia    PSVT (paroxysmal supraventricular tachycardia)    a. 08/2019 Zio: 189 brief runs of SVT (fastest 174 x 6 beats, longest 15 beats @ 107).   Tobacco abuse     Past Surgical History:  Procedure Laterality Date  CARDIAC CATHETERIZATION  05/06/2000   @ [place] N/A 05/22/2022   Procedure: CARDIOVERSION;  Surgeon: Minna Merritts, MD;  Location: [facility] ORS;  Service: Cardiovascular;  Laterality: N/A;   COLONOSCOPY WITH PROPOFOL N/A 04/20/2018   Procedure: COLONOSCOPY WITH PROPOFOL;  Surgeon: [name], MD;  Location: [facility] ENDOSCOPY;  Service: Endoscopy;  Laterality: N/A;   CORONARY ARTERY BYPASS GRAFT N/A 10/07/2019   Procedure: CORONARY ARTERY BYPASS GRAFTING (CABG) using LIMA to LAD; Endoscopic harvesting of left greater saphenous vein: SVG to RAMUS; SVG to OM1; SVG to PDA.;  Surgeon: Gaye Pollack, MD;  Location: [facility] OR;  Service: Open Heart Surgery;  Laterality: N/A;   ENDARTERECTOMY Right 10/07/2019    Procedure: ENDARTERECTOMY CAROTID;  Surgeon: Rosetta Posner, MD;  Location: [facility] OR;  Service: Vascular;  Laterality: Right;   ENDOVEIN HARVEST OF GREATER SAPHENOUS VEIN Left 10/07/2019   Procedure: [place] Greater Saphenous Vein;  Surgeon: Gaye Pollack, MD;  Location: [facility] OR;  Service: Open Heart Surgery;  Laterality: Left;   ESOPHAGOGASTRODUODENOSCOPY ENDOSCOPY     KNEE ARTHROSCOPY  07/25/04   left   [place]   right, open surgery- repair   RIGHT/LEFT HEART CATH AND CORONARY ANGIOGRAPHY Bilateral 09/08/2019   Procedure: RIGHT/LEFT HEART CATH AND CORONARY ANGIOGRAPHY;  Surgeon: Minna Merritts, MD;  Location: [place] CV LAB;  Service: Cardiovascular;  Laterality: Bilateral;   TEE WITHOUT CARDIOVERSION N/A 10/07/2019   Procedure: TRANSESOPHAGEAL ECHOCARDIOGRAM (TEE);  Surgeon: Gaye Pollack, MD;  Location: [place];  Service: Open Heart Surgery;  Laterality: N/A;   TOTAL KNEE ARTHROPLASTY Left 07/14/2016   Procedure: LEFT TOTAL KNEE ARTHROPLASTY;  Surgeon: Gaynelle Arabian, MD;  Location: [facility] ORS;  Service: Orthopedics;  Laterality: Left;   TOTAL KNEE ARTHROPLASTY Right 07/13/2017   Procedure: RIGHT TOTAL KNEE ARTHROPLASTY;  Surgeon: Gaynelle Arabian, MD;  Location: [facility] ORS;  Service: Orthopedics;  Laterality: Right;    Prior to Admission medications   Medication Sig Start Date End Date Taking? Authorizing Provider  ALPRAZolam (XANAX) 0.25 MG tablet Take 1 tablet (0.25 mg total) by mouth 2 (two) times daily as needed for anxiety. 04/28/22  Yes Masoud, Viann Shove, MD  amiodarone (PACERONE) 200 MG tablet Take 1 tablet (200 mg total) by mouth daily. 07/01/22  Yes Furth, Cadence H, PA-C  apixaban (ELIQUIS) 5 MG TABS tablet Take 1 tablet (5 mg total) by mouth 2 (two) times daily. 06/25/22  Yes Furth, Cadence H, PA-C  atorvastatin (LIPITOR) 80 MG tablet TAKE 1 TABLET BY MOUTH EVERY DAY 03/19/22  Yes Gollan, Kathlene November, MD  cyanocobalamin (VITAMIN B12) 1000 MCG tablet Take 1,000 mcg by  mouth daily.   Yes [provider]  dapagliflozin propanediol (FARXIGA) 10 MG TABS tablet Take 1 tablet (10 mg total) by mouth daily at 2 PM. 07/01/22  Yes Furth, Cadence H, PA-C  furosemide (LASIX) 40 MG tablet Take 2 tablets in the morning and 1 tablet at night that is 80 mg in the morning and 40 mg at night 04/04/22  Yes Georgette Shell, MD  glipiZIDE (GLUCOTROL) 5 MG tablet Take 1 tablet (5 mg total) by mouth 2 (two) times daily before a meal. 05/20/22  Yes Masoud, Viann Shove, MD  glucose blood (TRUE METRIX BLOOD GLUCOSE TEST) test strip 1 each by Other route daily. Use as instructed 01/24/20  Yes Masoud, Viann Shove, MD  iron polysaccharides (NIFEREX) 150 MG capsule TAKE 1 CAPSULE BY MOUTH EVERY DAY 07/16/22  Yes Masoud, [place],  MD  metoprolol tartrate (LOPRESSOR) 25 MG tablet Take 1 tablet (25 mg total) by mouth 2 (two) times daily. 10/01/21  Yes Masoud, Viann Shove, MD  pantoprazole (PROTONIX) 40 MG tablet TAKE 1 TABLET BY MOUTH DAILY BEFORE BREAKFAST 08/19/21  Yes Masoud, Viann Shove, MD    Family History  Problem Relation Age of Onset   Heart disease Father        CAD, MI x 3   Hypertension Father    Alcohol abuse Father    Diabetes Father    Diabetes Sister    Cancer Brother        lymphoma, in remission   Colon cancer Brother    Heart disease Sister        CABG x 3   Prostate cancer Neg Hx      Social History   Tobacco Use   Smoking status: Every Day    Packs/day: 1.00    Years: 52.00    Total pack years: 52.00    Types: Cigarettes   Smokeless tobacco: Never   Tobacco comments:    per patient wears a patch and has cut down on cigarettes/day (10/05/2019)  Vaping Use   Vaping Use: Never used  Substance Use Topics   Alcohol use: Yes    Alcohol/week: 0.0 standard drinks of alcohol    Comment: 5-6 cocktails, 1 times a week   Drug use: No    Allergies as of 08/05/2022 - Review Complete 08/05/2022  Allergen Reaction Noted   Ramipril Cough and Other (See Comments) 05/14/2020    Pseudoephedrine-guaifenesin er Other (See Comments) and Hypertension 08/01/2013    Review of Systems:    All systems reviewed and negative except where noted in HPI.   Observations/Objective:  Labs: CBC    Component Value Date/Time   WBC 7.7 05/19/2022 1138   RBC 6.79 (H) 05/19/2022 1138   HGB 16.7 05/19/2022 1138   HGB 15.3 08/31/2019 1511   HCT 53.7 (H) 05/19/2022 1138   HCT 49.0 08/31/2019 1511   PLT 241 05/19/2022 1138   PLT 360 08/31/2019 1511   MCV 79.1 (L) 05/19/2022 1138   MCV 74 (L) 08/31/2019 1511   MCV 83 05/14/2013 0252   MCH 24.6 (L) 05/19/2022 1138   MCHC 31.1 05/19/2022 1138   RDW 22.7 (H) 05/19/2022 1138   RDW 16.8 (H) 08/31/2019 1511   RDW 14.6 (H) 05/14/2013 0252   LYMPHSABS 2.0 05/19/2022 1138   LYMPHSABS 2.4 08/31/2019 1511   LYMPHSABS 2.7 05/14/2013 0252   MONOABS 0.7 05/19/2022 1138   MONOABS 0.4 05/14/2013 0252   EOSABS 0.0 05/19/2022 1138   EOSABS 0.0 08/31/2019 1511   EOSABS 0.1 05/14/2013 0252   BASOSABS 0.1 05/19/2022 1138   BASOSABS 0.1 08/31/2019 1511   BASOSABS 0.1 05/14/2013 0252   CMP     Component Value Date/Time   NA 140 05/19/2022 1055   NA 136 10/25/2019 1421   NA 140 05/14/2013 0252   K 3.7 05/19/2022 1055   K 3.9 05/14/2013 0252   CL 98 05/19/2022 1055   CL 105 05/14/2013 0252   CO2 30 05/19/2022 1055   CO2 29 05/14/2013 0252   GLUCOSE 147 (H) 05/19/2022 1055   GLUCOSE 115 (H) 05/14/2013 0252   BUN 36 (H) 05/19/2022 1055   BUN 16 10/25/2019 1421   BUN 34 (H) 05/14/2013 0252   CREATININE 2.56 (H) 05/19/2022 1055   CREATININE 2.60 (H) 03/18/2022 1203   CALCIUM 9.5 05/19/2022 1055   CALCIUM 8.6 05/14/2013 0252  PROT 6.6 03/18/2022 1203   PROT 6.5 05/13/2013 1106   ALBUMIN 3.8 03/28/2022 1349   ALBUMIN 3.6 05/13/2013 1106   AST 18 03/18/2022 1203   AST 17 05/13/2013 1106   ALT 16 03/18/2022 1203   ALT 22 05/13/2013 1106   ALKPHOS 25 (L) 12/07/2019 1218   ALKPHOS 35 (L) 05/13/2013 1106   BILITOT 0.6 03/18/2022  1203   BILITOT 0.4 05/13/2013 1106   GFRNONAA 26 (L) 05/19/2022 1055   GFRNONAA 34 (L) 06/12/2020 1031   GFRAA 39 (L) 06/12/2020 1031    Imaging Studies: No results found.  Assessment and Plan:   Joshua Roy is a 72 y.o. y/o male has been referred for Who is being seen today for iron deficiency anemia with a response to iron supplementation.  The patient will be set up for an EGD and colonoscopy to look for source of the iron deficiency anemia.  If these are negative the patient may need to undergo a capsule endoscopy.  The patient will be contacted by my office about taking his prep and stopping his anticoagulation so that he can go through these procedures.  The patient has been explained the plan and agrees with it  Follow Up Instructions:  I discussed the assessment and treatment plan with the patient. The patient was provided an opportunity to ask questions and all were answered. The patient agreed with the plan and demonstrated an understanding of the instructions.   The patient was advised to call back or seek an in-person evaluation if the symptoms worsen or if the condition fails to improve as anticipated.  I provided 35 minutes of non-face-to-face time during this encounter.   [name], MD  Speech recognition software was used to dictate the above note.

## 2022-08-06 ENCOUNTER — Telehealth: Payer: Self-pay | Admitting: Medical

## 2022-08-06 NOTE — Telephone Encounter (Signed)
   Pre-operative Risk Assessment    Patient Name: Joshua Roy  DOB: Sep 15, 1950 MRN: [id number]{      Request for Surgical Clearance    Procedure:   COLONOSCOPY AND EGD  Date of Surgery:  Clearance 09/02/22                               Surgeon:  NOT LISTED Surgeon's Group or Practice Name:  [facility] Phone number:  [phone] Fax number:  [phone] ATTN: MELANIE   Type of Clearance Requested:   - Pharmacy:  [facility] 3 DAYS PRIOR  Type of Anesthesia:  General    Additional requests/questions:    Signed, Eli Phillips   08/06/2022, 9:31 AM

## 2022-08-07 NOTE — Telephone Encounter (Signed)
Patient with diagnosis of atrial fibrillation on Eliquis for anticoagulation.    Procedure:   COLONOSCOPY AND EGD   Date of Surgery:  Clearance 09/02/22    CHA2DS2-VASc Score = 5   This indicates a 7.2% annual risk of stroke. The patient's score is based upon: CHF History: 1 HTN History: 1 Diabetes History: 1 Stroke History: 0 Vascular Disease History: 1 Age Score: 1 Gender Score: 0   CrCl 36 Platelet count 196  Per office protocol, patient can hold Eliquis for 3 days prior to procedure.   Patient will not need bridging with Lovenox (enoxaparin) around procedure.  **This guidance is not considered finalized until pre-operative APP has relayed final recommendations.**

## 2022-08-07 NOTE — Telephone Encounter (Signed)
   Primary Cardiologist: Ida Rogue, MD  Chart reviewed as part of pre-operative protocol coverage. Given past medical history and time since last visit, based on ACC/AHA guidelines, Joshua Roy would be at acceptable risk for the planned procedure without further cardiovascular testing.   I will route this recommendation to the requesting party via Epic fax function and remove from pre-op pool.  Per office protocol, patient can hold Eliquis for 3 days prior to procedure.   Patient will not need bridging with Lovenox (enoxaparin) around procedure.  Please call with questions.  Emmaline Life, NP-C  08/07/2022, 5:07 PM [address], [address] Office [phone] Fax [phone]

## 2022-08-07 NOTE — Telephone Encounter (Signed)
Cadence, patient seen by you on 07/01/2022. Since it has been less than 2 months since that visit, could you please comment on preoperative cardiac clearance for upcoming colonoscopy and EGD?  Thank you, Sharyn Lull

## 2022-08-12 NOTE — Telephone Encounter (Signed)
Primary Cardiologist: Ida Rogue, MD   Chart reviewed as part of pre-operative protocol coverage. Given past medical history and time since last visit, based on ACC/AHA guidelines, Jacari Iannello Callejo would be at acceptable risk for the planned procedure without further cardiovascular testing.    Per office protocol, patient can hold Eliquis for 3 days prior to procedure.   Patient will not need bridging with Lovenox (enoxaparin) around procedure.   Please call with questions.   Emmaline Life, NP-C

## 2022-08-13 ENCOUNTER — Institutional Professional Consult (permissible substitution): Payer: Medicare Other | Admitting: Cardiology

## 2022-08-15 ENCOUNTER — Other Ambulatory Visit: Payer: Self-pay | Admitting: Internal Medicine

## 2022-08-19 ENCOUNTER — Encounter: Payer: Self-pay | Admitting: Internal Medicine

## 2022-08-22 NOTE — Telephone Encounter (Signed)
Pt is aware as instructed  Pt states he has not seen the instructions in the mail, was mailed 2 weeks ago.  Copy placed in front office for pt and I wrote the instructions for Eliquis  Pt expressed understanding

## 2022-08-25 NOTE — Telephone Encounter (Signed)
Patient is aware to hold Eliquis 3 days prior to procedure

## 2022-09-01 ENCOUNTER — Encounter: Payer: Self-pay | Admitting: Gastroenterology

## 2022-09-02 ENCOUNTER — Ambulatory Visit: Payer: Medicare Other | Admitting: Certified Registered"

## 2022-09-02 ENCOUNTER — Encounter
Admission: RE | Disposition: A | Discharge status: Home / Self Care | Payer: Self-pay | Source: Home / Self Care | Attending: Gastroenterology

## 2022-09-02 ENCOUNTER — Ambulatory Visit
Admission: RE | Admit: 2022-09-02 | Discharge: 2022-09-02 | Disposition: A | Discharge status: Home / Self Care | Payer: Medicare Other | Attending: Gastroenterology | Admitting: Gastroenterology

## 2022-09-02 ENCOUNTER — Encounter: Payer: Self-pay | Admitting: Gastroenterology

## 2022-09-02 ENCOUNTER — Telehealth: Payer: Self-pay

## 2022-09-02 DIAGNOSIS — F17210 Nicotine dependence, cigarettes, uncomplicated: Secondary | ICD-10-CM | POA: Insufficient documentation

## 2022-09-02 DIAGNOSIS — I480 Paroxysmal atrial fibrillation: Secondary | ICD-10-CM | POA: Insufficient documentation

## 2022-09-02 DIAGNOSIS — E1122 Type 2 diabetes mellitus with diabetic chronic kidney disease: Secondary | ICD-10-CM | POA: Diagnosis not present

## 2022-09-02 DIAGNOSIS — E1151 Type 2 diabetes mellitus with diabetic peripheral angiopathy without gangrene: Secondary | ICD-10-CM | POA: Diagnosis not present

## 2022-09-02 DIAGNOSIS — N183 Chronic kidney disease, stage 3 unspecified: Secondary | ICD-10-CM | POA: Insufficient documentation

## 2022-09-02 DIAGNOSIS — I10 Essential (primary) hypertension: Secondary | ICD-10-CM | POA: Diagnosis not present

## 2022-09-02 DIAGNOSIS — J449 Chronic obstructive pulmonary disease, unspecified: Secondary | ICD-10-CM | POA: Diagnosis not present

## 2022-09-02 DIAGNOSIS — I2510 Atherosclerotic heart disease of native coronary artery without angina pectoris: Secondary | ICD-10-CM | POA: Diagnosis not present

## 2022-09-02 DIAGNOSIS — E119 Type 2 diabetes mellitus without complications: Secondary | ICD-10-CM | POA: Diagnosis not present

## 2022-09-02 DIAGNOSIS — I130 Hypertensive heart and chronic kidney disease with heart failure and stage 1 through stage 4 chronic kidney disease, or unspecified chronic kidney disease: Secondary | ICD-10-CM | POA: Diagnosis not present

## 2022-09-02 DIAGNOSIS — E785 Hyperlipidemia, unspecified: Secondary | ICD-10-CM | POA: Diagnosis not present

## 2022-09-02 DIAGNOSIS — I5032 Chronic diastolic (congestive) heart failure: Secondary | ICD-10-CM | POA: Diagnosis not present

## 2022-09-02 DIAGNOSIS — D509 Iron deficiency anemia, unspecified: Secondary | ICD-10-CM | POA: Diagnosis not present

## 2022-09-02 DIAGNOSIS — Z7984 Long term (current) use of oral hypoglycemic drugs: Secondary | ICD-10-CM | POA: Diagnosis not present

## 2022-09-02 DIAGNOSIS — I252 Old myocardial infarction: Secondary | ICD-10-CM | POA: Diagnosis not present

## 2022-09-02 DIAGNOSIS — F419 Anxiety disorder, unspecified: Secondary | ICD-10-CM | POA: Diagnosis not present

## 2022-09-02 DIAGNOSIS — Z951 Presence of aortocoronary bypass graft: Secondary | ICD-10-CM | POA: Diagnosis not present

## 2022-09-02 HISTORY — PX: ESOPHAGOGASTRODUODENOSCOPY: SHX5428

## 2022-09-02 HISTORY — PX: COLONOSCOPY WITH PROPOFOL: SHX5780

## 2022-09-02 SURGERY — COLONOSCOPY WITH PROPOFOL
Anesthesia: General

## 2022-09-02 MED ORDER — GLYCOPYRROLATE 0.2 MG/ML IJ SOLN
INTRAMUSCULAR | Status: DC | PRN
  Administered 2022-09-02: .2 mg via INTRAVENOUS

## 2022-09-02 MED ORDER — PROPOFOL 1000 MG/100ML IV EMUL
INTRAVENOUS | Status: AC
  Filled 2022-09-02: qty 100

## 2022-09-02 MED ORDER — SODIUM CHLORIDE 0.9 % IV SOLN: INTRAVENOUS | Status: DC

## 2022-09-02 MED ORDER — PROPOFOL 10 MG/ML IV BOLUS
INTRAVENOUS | Status: AC
  Filled 2022-09-02: qty 40

## 2022-09-02 MED ORDER — ONDANSETRON HCL 4 MG/2ML IJ SOLN
INTRAMUSCULAR | Status: DC | PRN
  Administered 2022-09-02: 4 mg via INTRAVENOUS

## 2022-09-02 MED ORDER — LACTATED RINGERS IV SOLN: INTRAVENOUS | Status: DC | PRN

## 2022-09-02 MED ORDER — EPHEDRINE SULFATE (PRESSORS) 50 MG/ML IJ SOLN
INTRAMUSCULAR | Status: DC | PRN
  Administered 2022-09-02: 5 mg via INTRAVENOUS
  Administered 2022-09-02: 10 mg via INTRAVENOUS
  Administered 2022-09-02 (×2): 5 mg via INTRAVENOUS

## 2022-09-02 MED ORDER — PHENYLEPHRINE HCL (PRESSORS) 10 MG/ML IV SOLN
INTRAVENOUS | Status: DC | PRN
  Administered 2022-09-02: 80 ug via INTRAVENOUS

## 2022-09-02 MED ORDER — PROPOFOL 500 MG/50ML IV EMUL
INTRAVENOUS | Status: DC | PRN
  Administered 2022-09-02 (×2): 50 mg via INTRAVENOUS
  Administered 2022-09-02: 150 ug/kg/min via INTRAVENOUS

## 2022-09-02 MED ORDER — LIDOCAINE HCL (CARDIAC) PF 100 MG/5ML IV SOSY
PREFILLED_SYRINGE | INTRAVENOUS | Status: DC | PRN
  Administered 2022-09-02: 80 mg via INTRAVENOUS

## 2022-09-02 MED ORDER — DEXMEDETOMIDINE HCL IN NACL 80 MCG/20ML IV SOLN
INTRAVENOUS | Status: DC | PRN
  Administered 2022-09-02: 8 ug via BUCCAL

## 2022-09-02 NOTE — Op Note (Signed)
[facility] Gastroenterology Patient Name: Joshua Roy Procedure Date: 09/02/2022 8:16 AM MRN: [id number] Account #: [id number] Date of Birth: [birth year] Admit Type: Outpatient Age: 72 Room: [facility] ENDO ROOM 4 Gender: Male Note Status: Finalized Instrument Name: Upper Endoscope [id number] Procedure:             Upper GI endoscopy Indications:           Iron deficiency anemia Providers:             [name] MD, MD Referring MD:          [name], MD (Referring MD) Medicines:             Propofol per Anesthesia Complications:         No immediate complications. Procedure:             Pre-Anesthesia Assessment:                        - Prior to the procedure, a History and Physical was                         performed, and patient medications and allergies were                         reviewed. The patient's tolerance of previous                         anesthesia was also reviewed. The risks and benefits                         of the procedure and the sedation options and risks                         were discussed with the patient. All questions were                         answered, and informed consent was obtained. Prior                         Anticoagulants: The patient has taken no anticoagulant                         or antiplatelet agents. ASA Grade Assessment: II - A                         patient with mild systemic disease. After reviewing                         the risks and benefits, the patient was deemed in                         satisfactory condition to undergo the procedure.                        After obtaining informed consent, the endoscope was                         passed under direct vision. Throughout the procedure,  the patient's blood pressure, pulse, and oxygen                         saturations were monitored continuously. The Endoscope                         was introduced through the mouth,  and advanced to the                         second part of duodenum. The upper GI endoscopy was                         accomplished without difficulty. The patient tolerated                         the procedure well. Findings:      The examined esophagus was normal.      The entire examined stomach was normal.      The examined duodenum was normal. Impression:            - Normal esophagus.                        - Normal stomach.                        - Normal examined duodenum.                        - No specimens collected. Recommendation:        - Discharge patient to home.                        - Resume previous diet.                        - Continue present medications.                        - Perform a colonoscopy today. Procedure Code(s):     --- Professional ---                        [phone], Esophagogastroduodenoscopy, flexible,                         transoral; diagnostic, including collection of                         specimen(s) by brushing or washing, when performed                         (separate procedure) Diagnosis Code(s):     --- Professional ---                        D50.9, Iron deficiency anemia, unspecified CPT copyright 2022 American Medical Association. All rights reserved. The codes documented in this report are preliminary and upon coder review may  be revised to meet current compliance requirements. [name] MD, MD 09/02/2022 8:27:25 AM This report has been signed electronically. Number of Addenda: 0 Note Initiated On: 09/02/2022 8:16 AM Estimated Blood Loss:  Estimated blood  loss: none.      [facility]

## 2022-09-02 NOTE — Telephone Encounter (Signed)
capsule for anemia

## 2022-09-02 NOTE — H&P (Addendum)
Joshua Lame, [address] [place], [place] [address] Phone:[phone] Fax : [phone]  Primary Care Physician:  Joshua Roy Primary Gastroenterologist:  Dr. Allen Roy  Pre-Procedure History & Physical: HPI:  Joshua Roy is a 72 y.o. male is here for an colonoscopy and EGD.   Past Medical History:  Diagnosis Date   (HFpEF) heart failure with preserved ejection fraction ([place])    a. 07/2019 Echo: EF 60-65%. Mod LVH. Gr1 DD. No rwma. Nl RV size/fxn. Mildly dil LA; b. 11/2019 Echo: EF 55-60%, no rwma, mild LVH. Nl RV size/fxn. Mod dil LA.   ACE-inhibitor cough    Anemia    Anxiety    Arrhythmia    atrial flutter   Bronchitis 06/27/2016   Carotid arterial disease ([place])    a. 09/2019 Carotid U/S: RICA 41-28%, LICA 7-86%; b. 01/6719 R CEA.   CHF (congestive heart failure) (HCC)    CKD (chronic kidney disease), stage III (HCC)    COPD (chronic obstructive pulmonary disease) (HCC)    cath, mild inf. hypokinesis EF 49%, 100% ROA   Coronary artery disease    a. 2001 Cath: LM 10, LAD 60p/m, D1 30, LCX 20p, RCA 156md-->Med Rx; b. 05/2017 Ex Mv: Ex time 5:46, antlat twi @ rest, fixed infarct w/ mild peri-infarct ischemia. EF 38%; c. 08/2019 Cath: LM 40ost, 70d, LAD 760mLCX 8013mCA 80-90p, 100m46m 09/2019 CABG x4: LIMA->LAD, VG->RI, VG->OM, VG->RPDA.   Diabetes mellitus type II    Hyperlipidemia    Hypertension    [facility] (osteoarthritis)    knee OA, injected 2012 by ortho   PAF (paroxysmal atrial fibrillation) (HCC)[place] a. 09/2019 post-op CABG-->Amio.   Pneumonia    PSVT (paroxysmal supraventricular tachycardia)    a. 08/2019 Zio: 189 brief runs of SVT (fastest 174 x 6 beats, longest 15 beats @ 107).   Tobacco abuse     Past Surgical History:  Procedure Laterality Date   CARDIAC CATHETERIZATION  05/06/2000   [place] [facility] N/A 05/22/2022   Procedure: CARDIOVERSION;  Surgeon: GollMinna Roy;  Location:  [facility] ORS;  Service: Cardiovascular;  Laterality: N/A;   COLONOSCOPY WITH PROPOFOL N/A 04/20/2018   Procedure: COLONOSCOPY WITH PROPOFOL;  Surgeon: WohlLucilla Roy;  Location: [facility] ENDOSCOPY;  Service: Endoscopy;  Laterality: N/A;   CORONARY ARTERY BYPASS GRAFT N/A 10/07/2019   Procedure: CORONARY ARTERY BYPASS GRAFTING (CABG) using LIMA to LAD; Endoscopic harvesting of left greater saphenous vein: SVG to RAMUS; SVG to OM1; SVG to PDA.;  Surgeon: BartGaye Roy;  [facility]  Service: Open Heart Surgery;  Laterality: N/A;   ENDARTERECTOMY Right 10/07/2019   Procedure: ENDARTERECTOMY CAROTID;  Surgeon: EarlRosetta Roy;  [facility]  Service: Vascular;  Laterality: Right;   ENDOVEIN HARVEST OF GREATER SAPHENOUS VEIN Left 10/07/2019   Procedure: EndoCharleston RopesGreater Saphenous Vein;  Surgeon: BartGaye Roy;  [facility]  Service: Open Heart Surgery;  Laterality: Left;   ESOPHAGOGASTRODUODENOSCOPY ENDOSCOPY     KNEE ARTHROSCOPY  07/25/04   left   KNEERogersight, open surgery- repair   RIGHT/LEFT HEART CATH AND CORONARY ANGIOGRAPHY Bilateral 09/08/2019   Procedure: RIGHT/LEFT HEART CATH AND CORONARY ANGIOGRAPHY;  Surgeon: GollMinna Roy;  [facility];  Service: Cardiovascular;  Laterality: Bilateral;   TEE WITHOUT CARDIOVERSION N/A 10/07/2019  Procedure: TRANSESOPHAGEAL ECHOCARDIOGRAM (TEE);  Surgeon: Joshua Pollack, Roy;  Location: [place];  Service: Open Heart Surgery;  Laterality: N/A;   TOTAL KNEE ARTHROPLASTY Left 07/14/2016   Procedure: LEFT TOTAL KNEE ARTHROPLASTY;  Surgeon: Joshua Arabian, Roy;  [facility];  Service: Orthopedics;  Laterality: Left;   TOTAL KNEE ARTHROPLASTY Right 07/13/2017   Procedure: RIGHT TOTAL KNEE ARTHROPLASTY;  Surgeon: Joshua Arabian, Roy;  Location: [facility];  Service: Orthopedics;  Laterality: Right;    Prior to Admission medications   Medication Sig Start Date End Date Taking? Authorizing  Provider  ALPRAZolam (XANAX) 0.25 MG tablet Take 1 tablet (0.25 mg total) by mouth 2 (two) times daily as needed for anxiety. 04/28/22   Joshua Roy  amiodarone (PACERONE) 200 MG tablet Take 1 tablet (200 mg total) by mouth daily. 07/01/22   Roy, Joshua H, PA-C  apixaban (ELIQUIS) 5 MG TABS tablet Take 1 tablet (5 mg total) by mouth 2 (two) times daily. 06/25/22   Roy, Joshua H, PA-C  atorvastatin (LIPITOR) 80 MG tablet TAKE 1 TABLET BY MOUTH EVERY DAY 03/19/22   Joshua Merritts, Roy  cyanocobalamin (VITAMIN B12) 1000 MCG tablet Take 1,000 mcg by mouth daily.    Provider, Historical, Roy  dapagliflozin propanediol (FARXIGA) 10 MG TABS tablet Take 1 tablet (10 mg total) by mouth daily at 2 PM. 07/01/22   Roy, Joshua H, PA-C  furosemide (LASIX) 40 MG tablet Take 2 tablets in the morning and 1 tablet at night that is 80 mg in the morning and 40 mg at night 04/04/22   Joshua Shell, Roy  glipiZIDE (GLUCOTROL) 5 MG tablet Take 1 tablet (5 mg total) by mouth 2 (two) times daily before a meal. 05/20/22   Joshua Roy  glucose blood (TRUE METRIX BLOOD GLUCOSE TEST) test strip 1 each by Other route daily. Use as instructed 01/24/20   Joshua Roy  iron polysaccharides (NIFEREX) 150 MG capsule TAKE 1 CAPSULE BY MOUTH EVERY DAY 07/16/22   Joshua Roy  metoprolol tartrate (LOPRESSOR) 25 MG tablet Take 1 tablet (25 mg total) by mouth 2 (two) times daily. 10/01/21   Joshua Roy  pantoprazole (PROTONIX) 40 MG tablet TAKE 1 TABLET BY MOUTH DAILY BEFORE BREAKFAST 08/19/21   Joshua Roy    Allergies as of 08/05/2022 - Review Complete 08/05/2022  Allergen Reaction Noted   Ramipril Cough and Other (See Comments) 05/14/2020   Pseudoephedrine-guaifenesin er Other (See Comments) and Hypertension 08/01/2013    Family History  Problem Relation Age of Onset   Heart disease Father        CAD, MI x 3   Hypertension Father    Alcohol abuse Father    Diabetes Father    Diabetes  Sister    Cancer Brother        lymphoma, in remission   Colon cancer Brother    Heart disease Sister        CABG x 3   Prostate cancer Neg Hx     Social History   Socioeconomic History   Marital status: Married    Spouse name: Janie   Number of children: 3   Years of education: Not on file   Highest education level: Not on file  Occupational History   Occupation: Engineer, manufacturing, Estate agent  Tobacco Use   Smoking status: Every Day    Packs/day: 1.00    Years: 52.00    Total pack years: 52.00  Types: Cigarettes   Smokeless tobacco: Never   Tobacco comments:    per patient wears a patch and has cut down on cigarettes/day (10/05/2019)  Vaping Use   Vaping Use: Never used  Substance and Sexual Activity   Alcohol use: Yes    Alcohol/week: 0.0 standard drinks of alcohol    Comment: 5-6 cocktails, 1 times a week   Drug use: No   Sexual activity: Not on file  Other Topics Concern   Not on file  Social History Narrative   From [place].  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   Social Determinants of Health   Financial Resource Strain: Low Risk  (06/14/2021)   Overall Financial Resource Strain (CARDIA)    Difficulty of Paying Living Expenses: Not hard at all  Food Insecurity: No Food Insecurity (04/27/2020)   Hunger Vital Sign    Worried About Running Out of Food in the Last Year: Never true    Ran Out of Food in the Last Year: Never true  Transportation Needs: No Transportation Needs (06/14/2021)   PRAPARE - Hydrologist (Medical): No    Lack of Transportation (Non-Medical): No  Physical Activity: Insufficiently Active (06/14/2021)   Exercise Vital Sign    Days of Exercise per Week: 2 days    Minutes of Exercise per Session: 20 min  Stress: No Stress Concern Present (06/14/2021)   [place]    Feeling of Stress : Not at all  Social Connections: Moderately  Isolated (06/14/2021)   Social Connection and Isolation Panel [NHANES]    Frequency of Communication with Friends and Family: More than three times a week    Frequency of Social Gatherings with Friends and Family: More than three times a week    Attends Religious Services: Never    Marine scientist or Organizations: No    Attends Archivist Meetings: Never    Marital Status: Married  Human resources officer Violence: Not At Risk (06/14/2021)   Humiliation, Afraid, Rape, and Kick questionnaire    Fear of Current or Ex-Partner: No    Emotionally Abused: No    Physically Abused: No    Sexually Abused: No    Review of Systems: See HPI, otherwise negative ROS  Physical Exam: There were no vitals taken for this visit. General:   Alert,  pleasant and cooperative in NAD Head:  Normocephalic and atraumatic. Neck:  Supple; no masses or thyromegaly. Lungs:  Clear throughout to auscultation.    Heart:  Regular rate and rhythm. Abdomen:  Soft, nontender and nondistended. Normal bowel sounds, without guarding, and without rebound.   Neurologic:  Alert and  oriented x4;  grossly normal neurologically.  Impression/Plan: Joshua Roy is here for an colonoscopy and EGD to be performed for anemia of blood loss   Risks, benefits, limitations, and alternatives regarding  colonoscopy and EGDhave been reviewed with the patient.  Questions have been answered.  All parties agreeable.   Joshua Lame, Roy  09/02/2022, 7:43 AM

## 2022-09-02 NOTE — Anesthesia Preprocedure Evaluation (Signed)
Anesthesia Evaluation  Patient identified by MRN, date of birth, ID band Patient awake    Reviewed: Allergy & Precautions, H&P , NPO status , Patient's Chart, lab work & pertinent test results, reviewed documented beta blocker date and time   History of Anesthesia Complications Negative for: history of anesthetic complications  Airway Mallampati: II  TM Distance: >3 FB Neck ROM: Full    Dental  (+) Teeth Intact, Dental Advisory Given, Poor Dentition   Pulmonary neg shortness of breath, neg sleep apnea, COPD, neg recent URI, Current Smoker and Patient abstained from smoking. Hx OSA but has lost 40 lbs and says he does not snore anymore   Pulmonary exam normal breath sounds clear to auscultation       Cardiovascular Exercise Tolerance: Good METShypertension, Pt. on medications and Pt. on home beta blockers pulmonary hypertension(-) angina + CAD, + Past MI, + CABG, + Peripheral Vascular Disease and +CHF  (-) dysrhythmias + Valvular Problems/Murmurs  Rhythm:Irregular Rate:Normal - Systolic murmurs 1. Left ventricular ejection fraction, by estimation, is 60 to 65%. The  left ventricle has normal function. Left ventricular endocardial border  not optimally defined to evaluate regional wall motion. There is severe  left ventricular hypertrophy. Left  ventricular diastolic parameters are indeterminate.  2. Right ventricular systolic function is mildly reduced. The right  ventricular size is moderately enlarged. Severely increased right  ventricular wall thickness. There is severely elevated pulmonary artery  systolic pressure. The estimated right  ventricular systolic pressure is 56.2 mmHg.  3. Left atrial size was upper normal.  4. Right atrial size was mildly dilated.  5. The mitral valve is abnormal. Trivial mitral valve regurgitation.  6. Tricuspid valve regurgitation is mild to moderate.  7. The aortic valve is tricuspid.  There is moderate calcification of the  aortic valve. There is moderate thickening of the aortic valve. Aortic  valve regurgitation is trivial. Aortic valve sclerosis/calcification is  present, without any evidence of  aortic stenosis.  8. Aortic dilatation noted. There is borderline dilatation of the aortic  root, measuring 38 mm.  9. The inferior vena cava is dilated in size with <50% respiratory  variability, suggesting right atrial pressure of 15 mmHg.    Neuro/Psych  PSYCHIATRIC DISORDERS Anxiety     negative neurological ROS     GI/Hepatic Neg liver ROS, PUD,neg GERD  ,,  Endo/Other  diabetes, Type 2, Oral Hypoglycemic Agents  Morbid obesity  Renal/GU CRFRenal disease  negative genitourinary   Musculoskeletal  (+) Arthritis , Osteoarthritis,    Abdominal   Peds  Hematology negative hematology ROS (+)   Anesthesia Other Findings Past Medical History: No date: (HFpEF) heart failure with preserved ejection fraction ([place])     Comment:  a. 07/2019 Echo: EF 60-65%. Mod LVH. Gr1 DD. No rwma. Nl               RV size/fxn. Mildly dil LA; b. 11/2019 Echo: EF 55-60%, no              rwma, mild LVH. Nl RV size/fxn. Mod dil LA. No date: ACE-inhibitor cough No date: Anemia No date: Anxiety No date: Arrhythmia     Comment:  atrial flutter 06/27/2016: Bronchitis No date: Carotid arterial disease ([place])     Comment:  a. 09/2019 Carotid U/S: RICA 13-08%, LICA 6-57%; b.               09/2019 R CEA. No date: CHF (congestive heart failure) (HCC) No date: CKD (chronic  kidney disease), stage III ([place]) No date: COPD (chronic obstructive pulmonary disease) (HCC)     Comment:  cath, mild inf. hypokinesis EF 49%, 100% ROA No date: Coronary artery disease     Comment:  a. 2001 Cath: LM 10, LAD 60p/m, D1 30, LCX 20p, RCA               163md-->Med Rx; b. 05/2017 Ex Mv: Ex time 5:46, antlat               twi @ rest, fixed infarct w/ mild peri-infarct ischemia.               EF 38%; c.  08/2019 Cath: LM 40ost, 70d, LAD 766mLCX 8046m             RCA 80-90p, 100m48m 09/2019 CABG x4: LIMA->LAD, VG->RI,               VG->OM, VG->RPDA. No date: Diabetes mellitus type II No date: Hyperlipidemia No date: Hypertension age 72: 3 (myocardial infarction) (HCC)[place] date: OA (osteoarthritis)     Comment:  knee OA, injected 2012 by ortho No date: PAF (paroxysmal atrial fibrillation) (HCC)[place]  Comment:  a. 09/2019 post-op CABG-->Amio. No date: Pneumonia No date: PSVT (paroxysmal supraventricular tachycardia)     Comment:  a. 08/2019 Zio: 189 brief runs of SVT (fastest 174 x 6               beats, longest 15 beats @ 107). No date: Tobacco abuse  Reproductive/Obstetrics negative OB ROS                             Anesthesia Physical Anesthesia Plan  ASA: 4  Anesthesia Plan: General   Post-op Pain Management: Minimal or no pain anticipated   Induction: Intravenous  PONV Risk Score and Plan: 1 and Propofol infusion and TIVA  Airway Management Planned: Nasal Cannula  Additional Equipment: None  Intra-op Plan:   Post-operative Plan:   Informed Consent: I have reviewed the patients History and Physical, chart, labs and discussed the procedure including the risks, benefits and alternatives for the proposed anesthesia with the patient or authorized representative who has indicated his/her understanding and acceptance.     Dental advisory given  Plan Discussed with: CRNA and Surgeon  Anesthesia Plan Comments: (Discussed risks of anesthesia with patient, including possibility of difficulty with spontaneous ventilation under anesthesia necessitating airway intervention, PONV, and rare risks such as cardiac or respiratory or neurological events, and allergic reactions. Discussed the role of CRNA in patient's perioperative care. Patient understands. Patient counseled on being higher risk for anesthesia due to comorbidities: HFpEF, COPD. Patient was told  about increased risk of cardiac and respiratory events, including death. )        Anesthesia Quick Evaluation

## 2022-09-02 NOTE — Transfer of Care (Signed)
Immediate Anesthesia Transfer of Care Note  Patient: Joshua Roy  Procedure(s) Performed: COLONOSCOPY WITH PROPOFOL ESOPHAGOGASTRODUODENOSCOPY (EGD)  Patient Location: PACU  Anesthesia Type:General  Level of Consciousness: awake, alert , and oriented  Airway & Oxygen Therapy: Patient Spontanous Breathing  Post-op Assessment: Report given to RN and Post -op Vital signs reviewed and stable  Post vital signs: Reviewed and stable  Last Vitals:  Vitals Value Taken Time  BP 120/52 09/02/22 0845  Temp 35.9 C 09/02/22 0844  Pulse 62 09/02/22 0848  Resp 26 09/02/22 0848  SpO2 96 % 09/02/22 0848  Vitals shown include unvalidated device data.  Last Pain:  Vitals:   09/02/22 0844  TempSrc: Temporal  PainSc: 0-No pain         Complications: No notable events documented.

## 2022-09-02 NOTE — Op Note (Signed)
[facility] Patient Name: Joshua Roy Procedure Date: 09/02/2022 8:16 AM MRN: [id number] Account #: [id number] Date of Birth: 12/13/1950 Admit Type: Outpatient Age: 72 Room: [facility] ENDO ROOM 4 Gender: Male Note Status: Finalized Instrument Name: Jasper Riling [id number] Procedure:             Colonoscopy Indications:           Iron deficiency anemia Providers:             [name] MD, MD Referring MD:          [name], MD (Referring MD) Medicines:             Propofol per Anesthesia Complications:         No immediate complications. Procedure:             Pre-Anesthesia Assessment:                        - Prior to the procedure, a History and Physical was                         performed, and patient medications and allergies were                         reviewed. The patient's tolerance of previous                         anesthesia was also reviewed. The risks and benefits                         of the procedure and the sedation options and risks                         were discussed with the patient. All questions were                         answered, and informed consent was obtained. Prior                         Anticoagulants: The patient has taken no anticoagulant                         or antiplatelet agents. ASA Grade Assessment: II - A                         patient with mild systemic disease. After reviewing                         the risks and benefits, the patient was deemed in                         satisfactory condition to undergo the procedure.                        After obtaining informed consent, the colonoscope was                         passed under direct vision. Throughout the procedure,  the patient's blood pressure, pulse, and oxygen                         saturations were monitored continuously. The                         Colonoscope was introduced through the anus and                          advanced to the the ileocecal valve. The colonoscopy                         was performed without difficulty. The patient                         tolerated the procedure well. The quality of the bowel                         preparation was good. Findings:      The perianal and digital rectal examinations were normal.      The terminal ileum appeared normal.      The colon (entire examined portion) appeared normal. Impression:            - The examined portion of the ileum was normal.                        - The entire examined colon is normal.                        - No specimens collected. Recommendation:        - Discharge patient to home.                        - Resume previous diet.                        - Continue present medications.                        - Repeat colonoscopy in 7 years for surveillance.                        - To visualize the small bowel, perform video capsule                         endoscopy at appointment to be scheduled. Procedure Code(s):     --- Professional ---                        [id number], Colonoscopy, flexible; diagnostic, including                         collection of specimen(s) by brushing or washing, when                         performed (separate procedure) Diagnosis Code(s):     --- Professional ---                        D50.9, Iron deficiency anemia, unspecified CPT copyright 2022  American Medical Association. All rights reserved. The codes documented in this report are preliminary and upon coder review may  be revised to meet current compliance requirements. [name] MD, MD 09/02/2022 8:41:17 AM This report has been signed electronically. Number of Addenda: 0 Note Initiated On: 09/02/2022 8:16 AM Scope Withdrawal Time: 0 hours 6 minutes 39 seconds  Total Procedure Duration: 0 hours 7 minutes 5 seconds  Estimated Blood Loss:  Estimated blood loss: none.      [facility]

## 2022-09-03 ENCOUNTER — Encounter: Payer: Self-pay | Admitting: Gastroenterology

## 2022-09-03 DIAGNOSIS — M7541 Impingement syndrome of right shoulder: Secondary | ICD-10-CM | POA: Diagnosis not present

## 2022-09-08 ENCOUNTER — Other Ambulatory Visit: Payer: Self-pay | Admitting: Cardiovascular Disease

## 2022-09-08 ENCOUNTER — Other Ambulatory Visit: Payer: Self-pay

## 2022-09-08 DIAGNOSIS — D509 Iron deficiency anemia, unspecified: Secondary | ICD-10-CM

## 2022-09-09 NOTE — Telephone Encounter (Signed)
Has been scheduled for 09/30/22

## 2022-09-15 DIAGNOSIS — D225 Melanocytic nevi of trunk: Secondary | ICD-10-CM | POA: Diagnosis not present

## 2022-09-15 DIAGNOSIS — D2272 Melanocytic nevi of left lower limb, including hip: Secondary | ICD-10-CM | POA: Diagnosis not present

## 2022-09-15 DIAGNOSIS — L821 Other seborrheic keratosis: Secondary | ICD-10-CM | POA: Diagnosis not present

## 2022-09-15 DIAGNOSIS — L570 Actinic keratosis: Secondary | ICD-10-CM | POA: Diagnosis not present

## 2022-09-15 DIAGNOSIS — D2262 Melanocytic nevi of left upper limb, including shoulder: Secondary | ICD-10-CM | POA: Diagnosis not present

## 2022-09-15 DIAGNOSIS — D2261 Melanocytic nevi of right upper limb, including shoulder: Secondary | ICD-10-CM | POA: Diagnosis not present

## 2022-09-15 DIAGNOSIS — D2271 Melanocytic nevi of right lower limb, including hip: Secondary | ICD-10-CM | POA: Diagnosis not present

## 2022-09-17 DIAGNOSIS — E1159 Type 2 diabetes mellitus with other circulatory complications: Secondary | ICD-10-CM | POA: Diagnosis not present

## 2022-09-17 DIAGNOSIS — F17200 Nicotine dependence, unspecified, uncomplicated: Secondary | ICD-10-CM | POA: Diagnosis not present

## 2022-09-17 DIAGNOSIS — I10 Essential (primary) hypertension: Secondary | ICD-10-CM | POA: Diagnosis not present

## 2022-09-17 DIAGNOSIS — Z125 Encounter for screening for malignant neoplasm of prostate: Secondary | ICD-10-CM | POA: Diagnosis not present

## 2022-09-17 DIAGNOSIS — J449 Chronic obstructive pulmonary disease, unspecified: Secondary | ICD-10-CM | POA: Diagnosis not present

## 2022-09-17 DIAGNOSIS — D631 Anemia in chronic kidney disease: Secondary | ICD-10-CM | POA: Diagnosis not present

## 2022-09-17 DIAGNOSIS — E1122 Type 2 diabetes mellitus with diabetic chronic kidney disease: Secondary | ICD-10-CM | POA: Diagnosis not present

## 2022-09-17 DIAGNOSIS — E785 Hyperlipidemia, unspecified: Secondary | ICD-10-CM | POA: Diagnosis not present

## 2022-09-17 DIAGNOSIS — I129 Hypertensive chronic kidney disease with stage 1 through stage 4 chronic kidney disease, or unspecified chronic kidney disease: Secondary | ICD-10-CM | POA: Diagnosis not present

## 2022-09-17 DIAGNOSIS — I25709 Atherosclerosis of coronary artery bypass graft(s), unspecified, with unspecified angina pectoris: Secondary | ICD-10-CM | POA: Diagnosis not present

## 2022-09-17 DIAGNOSIS — N184 Chronic kidney disease, stage 4 (severe): Secondary | ICD-10-CM | POA: Diagnosis not present

## 2022-09-18 NOTE — Anesthesia Postprocedure Evaluation (Signed)
Anesthesia Post Note  Patient: Joshua Roy  Procedure(s) Performed: COLONOSCOPY WITH PROPOFOL ESOPHAGOGASTRODUODENOSCOPY (EGD)  Patient location during evaluation: Endoscopy Anesthesia Type: General Level of consciousness: awake and alert Pain management: pain level controlled Vital Signs Assessment: post-procedure vital signs reviewed and stable Respiratory status: spontaneous breathing, nonlabored ventilation, respiratory function stable and patient connected to nasal cannula oxygen Cardiovascular status: blood pressure returned to baseline and stable Postop Assessment: no apparent nausea or vomiting Anesthetic complications: no   No notable events documented.   Last Vitals:  Vitals:   09/02/22 0754 09/02/22 0844  BP: (!) 159/65 (!) 120/52  Pulse: (!) 50   Resp: 16   Temp: (!) 35.8 C (!) 35.9 C  SpO2: 95%     Last Pain:  Vitals:   09/03/22 0821  TempSrc:   PainSc: 0-No pain                 Martha Clan

## 2022-09-29 ENCOUNTER — Telehealth: Payer: Self-pay | Admitting: Gastroenterology

## 2022-09-29 NOTE — Telephone Encounter (Signed)
PT is not feeling well would like to cancel procedure for 09/30/2022 aand will call back and reschedule

## 2022-09-29 NOTE — Telephone Encounter (Signed)
Trish in Endo has been asked to place procedure  (Givens Capsule) in the depot.  Message routed to [facility] to let her know of patient requested to cancel his 09/30/22 procedure due to not feeling well.  Thanks, [place], [place]

## 2022-09-29 NOTE — Progress Notes (Unsigned)
This cardiology Office Note  Date:  09/30/2022   ID:  Joshua Roy, DOB 08/10/1950, MRN Joshua Roy:[id number]  PCP:  Joshua Athens, MD   Chief Complaint  Patient presents with   3 month follow up     Patient c/o cough & congestion for about 1 week. Medications reviewed by the patient verbally.     HPI:  Joshua Roy is 72 year old gentleman with a history of  coronary artery disease,  occluded mid to distal RCA  by cardiac catheterization in 2001,  residual 60% proximal to mid LAD disease at that time, 30% diagonal disease, 10% left main disease, 20% proximal left circumflex disease.   bypass surgery March 2021,  Active smoker hyperlipidemia,  obesity and diabetes  PAD, carotid stenosis, s/p carotid endarterectomy March 2021, postoperative atrial fibrillation Admission to the hospital for CHF August 2023 Diabetes Chronic renal sufficiency, followed by nephrology, creatinine greater than 3 He presents today for follow-up of his coronary artery disease, CHF, hx of CABG  Last seen by myself in clinic August 2022 Cardioversion for afib 05/22/22 40 pounds weight loss in 6 months through dietary changes, walking  Completed PT, walking better, able to get up and down his long driveway Feeling well Wife with pulmonary fibrosis  Lab work reviewed CR 2.6, stable A1C 7.7  EKG personally reviewed by myself on todays visit Normal sinus rhythm rate 60 bpm nonspecific ST abnormality  Other past medical history reviewed 8/23 hospital admission for acute on chronic diastolic CHF gained approximately 20 pounds.    echocardiogram 04/01/2022-ejection fraction 60 to 65%.  Severe LVH.  Right ventricular systolic function is mildly reduced.  Severely elevated pulmonary artery systolic pressure.  CKD stage IV-creatinine 3.25 from 3.51 from 3.62 from 3.94 on Lasix drip.  His creatinine was 3.25 on discharge.    long hospital course April 2021 Multiorgan failure   Past medical history  reviewed  May 2021, CHF exacerbation, sent to the hospital multiorgan failure COPD, anemia, acute on chronic kidney failure, difficulty with diuresis in the setting of CHF,  Management limited by his anxiety and desire to go home Chronic hypoxia saturations in the 80s on 10 L oxygen throughout his hospital course -Also treated for bronchitis Poorly controlled diabetes   PMH:   has a past medical history of (HFpEF) heart failure with preserved ejection fraction ([place]), ACE-inhibitor cough, Anemia, Anxiety, Arrhythmia, Bronchitis (06/27/2016), Carotid arterial disease ([place]), CHF (congestive heart failure) ([place]), CKD (chronic kidney disease), stage III ([place]), COPD (chronic obstructive pulmonary disease) ([place]), Coronary artery disease, Diabetes mellitus type II, Hyperlipidemia, Hypertension, MI (myocardial infarction) ([place]) (age 1), OA (osteoarthritis), PAF (paroxysmal atrial fibrillation) ([place]), Pneumonia, PSVT (paroxysmal supraventricular tachycardia), and Tobacco abuse.  PSH:    Past Surgical History:  Procedure Laterality Date   CARDIAC CATHETERIZATION  05/06/2000   @ [place] N/A 05/22/2022   Procedure: CARDIOVERSION;  Surgeon: Minna Merritts, MD;  Location: [facility] ORS;  Service: Cardiovascular;  Laterality: N/A;   COLONOSCOPY WITH PROPOFOL N/A 04/20/2018   Procedure: COLONOSCOPY WITH PROPOFOL;  Surgeon: [name], MD;  Location: [facility] ENDOSCOPY;  Service: Endoscopy;  Laterality: N/A;   COLONOSCOPY WITH PROPOFOL N/A 09/02/2022   Procedure: COLONOSCOPY WITH PROPOFOL;  Surgeon: [name], MD;  Location[facility] ENDOSCOPY;  Service: Endoscopy;  Laterality: N/A;   CORONARY ARTERY BYPASS GRAFT N/A 10/07/2019   Procedure: CORONARY ARTERY BYPASS GRAFTING (CABG) using LIMA to LAD; Endoscopic harvesting of left greater saphenous vein: SVG to RAMUS; SVG to OM1; SVG to  PDA.;  Surgeon: Gaye Pollack, MD;  Location: [facility];  Service: Open Heart Surgery;  Laterality: N/A;   ENDARTERECTOMY  Right 10/07/2019   Procedure: ENDARTERECTOMY CAROTID;  Surgeon: Rosetta Posner, MD;  Location: [facility] OR;  Service: Vascular;  Laterality: Right;   ENDOVEIN HARVEST OF GREATER SAPHENOUS VEIN Left 10/07/2019   Procedure: [place] Of Greater Saphenous Vein;  Surgeon: Gaye Pollack, MD;  Location: [facility] OR;  Service: Open Heart Surgery;  Laterality: Left;   ESOPHAGOGASTRODUODENOSCOPY N/A 09/02/2022   Procedure: ESOPHAGOGASTRODUODENOSCOPY (EGD);  Surgeon: [name], MD;  Location: [facility] ENDOSCOPY;  Service: Endoscopy;  Laterality: N/A;   ESOPHAGOGASTRODUODENOSCOPY ENDOSCOPY     KNEE ARTHROSCOPY  07/25/04   left   [place]   right, open surgery- repair   RIGHT/LEFT HEART CATH AND CORONARY ANGIOGRAPHY Bilateral 09/08/2019   Procedure: RIGHT/LEFT HEART CATH AND CORONARY ANGIOGRAPHY;  Surgeon: Minna Merritts, MD;  Location: [place] CV LAB;  Service: Cardiovascular;  Laterality: Bilateral;   TEE WITHOUT CARDIOVERSION N/A 10/07/2019   Procedure: TRANSESOPHAGEAL ECHOCARDIOGRAM (TEE);  Surgeon: Gaye Pollack, MD;  Location: [place];  Service: Open Heart Surgery;  Laterality: N/A;   TOTAL KNEE ARTHROPLASTY Left 07/14/2016   Procedure: LEFT TOTAL KNEE ARTHROPLASTY;  Surgeon: Gaynelle Arabian, MD;  Location: [facility] ORS;  Service: Orthopedics;  Laterality: Left;   TOTAL KNEE ARTHROPLASTY Right 07/13/2017   Procedure: RIGHT TOTAL KNEE ARTHROPLASTY;  Surgeon: Gaynelle Arabian, MD;  Location: [facility] ORS;  Service: Orthopedics;  Laterality: Right;    Current Outpatient Medications  Medication Sig Dispense Refill   ALPRAZolam (XANAX) 0.25 MG tablet Take 1 tablet (0.25 mg total) by mouth 2 (two) times daily as needed for anxiety. 60 tablet 0   amiodarone (PACERONE) 200 MG tablet Take 1 tablet (200 mg total) by mouth daily. 90 tablet 0   apixaban (ELIQUIS) 5 MG TABS tablet Take 1 tablet (5 mg total) by mouth 2 (two) times daily. 180 tablet 1   atorvastatin (LIPITOR) 80 MG tablet TAKE 1 TABLET BY MOUTH EVERY  DAY 90 tablet 1   cyanocobalamin (VITAMIN B12) 1000 MCG tablet Take 1,000 mcg by mouth daily.     cyclobenzaprine (FLEXERIL) 5 MG tablet Take 5 mg by mouth 3 (three) times daily as needed.     dapagliflozin propanediol (FARXIGA) 10 MG TABS tablet Take 1 tablet (10 mg total) by mouth daily at 2 PM. 60 tablet 3   ferrous sulfate 325 (65 FE) MG EC tablet Take 325 mg by mouth every other day.     furosemide (LASIX) 40 MG tablet Take 2 tablets in the morning and 1 tablet at night that is 80 mg in the morning and 40 mg at night 60 tablet 11   glipiZIDE (GLUCOTROL) 5 MG tablet Take 5 mg by mouth daily before breakfast.     glucose blood (TRUE METRIX BLOOD GLUCOSE TEST) test strip 1 each by Other route daily. Use as instructed 100 each 12   iron polysaccharides (NIFEREX) 150 MG capsule TAKE 1 CAPSULE BY MOUTH EVERY DAY 90 capsule 1   metoprolol tartrate (LOPRESSOR) 25 MG tablet Take 1 tablet (25 mg total) by mouth 2 (two) times daily. 180 tablet 3   pantoprazole (PROTONIX) 40 MG tablet TAKE 1 TABLET BY MOUTH DAILY BEFORE BREAKFAST 90 tablet 3   traZODone (DESYREL) 50 MG tablet Take 50 mg by mouth at bedtime.     No current facility-administered medications for this visit.     Allergies:  Ramipril and Pseudoephedrine-guaifenesin er   Social History:  The patient  reports that he has been smoking cigarettes. He has a 52.00 pack-year smoking history. He has never used smokeless tobacco. He reports current alcohol use. He reports that he does not use drugs.   Family History:   family history includes Alcohol abuse in his father; Cancer in his brother; Colon cancer in his brother; Diabetes in his father and sister; Heart disease in his father and sister; Hypertension in his father.    Review of Systems: Review of Systems  Constitutional: Negative.   HENT: Negative.    Respiratory: Negative.    Cardiovascular: Negative.   Gastrointestinal: Negative.   Musculoskeletal: Negative.   Neurological:  Negative.   Psychiatric/Behavioral: Negative.    All other systems reviewed and are negative.   PHYSICAL EXAM: VS:  BP 120/60 (BP Location: Left Arm, Patient Position: Sitting, Cuff Size: Normal)   Pulse 60   Ht '5\' 9"'$  (1.753 m)   Wt 219 lb (99.3 kg)   SpO2 94%   BMI 32.34 kg/m  , BMI Body mass index is 32.34 kg/m. Constitutional:  oriented to person, place, and time. No distress.  HENT:  Head: Grossly normal Eyes:  no discharge. No scleral icterus.  Neck: No JVD, no carotid bruits  Cardiovascular: Regular rate and rhythm, no murmurs appreciated Pulmonary/Chest: Clear to auscultation bilaterally, no wheezes or rails Abdominal: Soft.  no distension.  no tenderness.  Musculoskeletal: Normal range of motion Neurological:  normal muscle tone. Coordination normal. No atrophy Skin: Skin warm and dry Psychiatric: normal affect, pleasant  Recent Labs: 03/18/2022: ALT 16 04/01/2022: B Natriuretic Peptide 267.2; TSH 2.088 05/19/2022: BUN 36; Creatinine, Ser 2.56; Hemoglobin 16.7; Platelets 241; Potassium 3.7; Sodium 140    Lipid Panel Lab Results  Component Value Date   CHOL 124 06/13/2021   HDL 39 (L) 06/13/2021   LDLCALC 63 06/13/2021   TRIG 138 06/13/2021    Wt Readings from Last 3 Encounters:  09/30/22 219 lb (99.3 kg)  09/02/22 224 lb (101.6 kg)  07/01/22 218 lb (98.9 kg)     ASSESSMENT AND PLAN:  Atrial flutter/atrial fibrillation Prior cardioversion, maintaining normal sinus rhythm Recommend he continue current dose of metoprolol 25 twice daily, amiodarone 200 daily, continue Eliquis 5 twice daily Complemented him on weight loss  Coronary artery disease of native artery of native heart with stable angina pectoris ([place]) bypass surgery 2021 Currently with no symptoms of angina. No further workup at this time. Continue current medication regimen.  type 2 diabetes mellitus with circulatory disorder  A1c running high, Recommend continued aggressive diet restriction,  close follow-up with primary care for medication adjustment  Chronic diastolic CHF Appears euvolemic, much improved after weight loss 40 pounds  Mixed hyperlipidemia  recommend to continue Lipitor  Essential hypertension Blood pressure is well controlled on today's visit. No changes made to the medications.  DISORDER, TOBACCO USE We have encouraged him to continue to work on weaning his cigarettes and smoking cessation. He will continue to work on this and does not want any assistance with chantix.    Centrilobular emphysema (HCC)  COPD,  Smoking cessation recommended  Morbid obesity ([place]) We have encouraged continued exercise, careful diet management in an effort to lose weight.  Chronic renal failure In the setting of diabetes Followed by nephrology, creatinine 2.6, stable   Total encounter time more than 30 minutes  Greater than 50% was spent in counseling and coordination of care with the patient  No orders of the defined types were placed in this encounter.    Signed, Esmond Plants, M.D., Ph.D. 09/30/2022  [place], [place]

## 2022-09-30 ENCOUNTER — Ambulatory Visit: Admission: RE | Admit: 2022-09-30 | Payer: Medicare Other | Source: Home / Self Care | Admitting: Gastroenterology

## 2022-09-30 ENCOUNTER — Ambulatory Visit: Payer: Medicare Other | Attending: Cardiovascular Disease | Admitting: Cardiovascular Disease

## 2022-09-30 ENCOUNTER — Encounter: Admission: RE | Payer: Self-pay | Source: Home / Self Care

## 2022-09-30 ENCOUNTER — Encounter: Payer: Self-pay | Admitting: Cardiovascular Disease

## 2022-09-30 VITALS — BP 120/60 | HR 60 | Ht 69.0 in | Wt 219.0 lb

## 2022-09-30 DIAGNOSIS — E1142 Type 2 diabetes mellitus with diabetic polyneuropathy: Secondary | ICD-10-CM

## 2022-09-30 DIAGNOSIS — I25118 Atherosclerotic heart disease of native coronary artery with other forms of angina pectoris: Secondary | ICD-10-CM | POA: Insufficient documentation

## 2022-09-30 DIAGNOSIS — N184 Chronic kidney disease, stage 4 (severe): Secondary | ICD-10-CM | POA: Diagnosis not present

## 2022-09-30 DIAGNOSIS — I10 Essential (primary) hypertension: Secondary | ICD-10-CM | POA: Diagnosis not present

## 2022-09-30 DIAGNOSIS — E782 Mixed hyperlipidemia: Secondary | ICD-10-CM | POA: Diagnosis not present

## 2022-09-30 DIAGNOSIS — I5032 Chronic diastolic (congestive) heart failure: Secondary | ICD-10-CM | POA: Diagnosis not present

## 2022-09-30 DIAGNOSIS — Z720 Tobacco use: Secondary | ICD-10-CM | POA: Diagnosis not present

## 2022-09-30 DIAGNOSIS — E1122 Type 2 diabetes mellitus with diabetic chronic kidney disease: Secondary | ICD-10-CM | POA: Insufficient documentation

## 2022-09-30 DIAGNOSIS — I4891 Unspecified atrial fibrillation: Secondary | ICD-10-CM | POA: Insufficient documentation

## 2022-09-30 SURGERY — IMAGING PROCEDURE, GI TRACT, INTRALUMINAL, VIA CAPSULE

## 2022-09-30 MED ORDER — FUROSEMIDE 40 MG PO TABS: ORAL_TABLET | ORAL | 3 refills | Status: DC

## 2022-09-30 MED ORDER — AMIODARONE HCL 200 MG PO TABS: 200.0000 mg | ORAL_TABLET | Freq: Every day | ORAL | 3 refills | Status: DC

## 2022-09-30 MED ORDER — APIXABAN 5 MG PO TABS: 5.0000 mg | ORAL_TABLET | Freq: Two times a day (BID) | ORAL | 3 refills | Status: DC

## 2022-09-30 MED ORDER — ATORVASTATIN CALCIUM 80 MG PO TABS: 80.0000 mg | ORAL_TABLET | Freq: Every day | ORAL | 3 refills | Status: DC

## 2022-09-30 MED ORDER — METOPROLOL TARTRATE 25 MG PO TABS: 25.0000 mg | ORAL_TABLET | Freq: Two times a day (BID) | ORAL | 3 refills | Status: DC

## 2022-09-30 NOTE — Patient Instructions (Signed)
Medication Instructions:  No changes  If you need a refill on your cardiac medications before your next appointment, please call your pharmacy.    Lab work: No new labs needed   Testing/Procedures: No new testing needed   Follow-Up: At [facility], you and your health needs are our priority.  As part of our continuing mission to provide you with exceptional heart care, we have created designated Provider Care Teams.  These Care Teams include your primary Cardiologist (physician) and Advanced Practice Providers (APPs -  Physician Assistants and Nurse Practitioners) who all work together to provide you with the care you need, when you need it.  You will need a follow up appointment in 6 months  Providers on your designated Care Team:   Christopher Berge, NP Ryan Dunn, PA-C Cadence Furth, PA-C  COVID-19 Vaccine Information can be found at: [email or web address] For questions related to vaccine distribution or appointments, please email [email or web address] or call [phone].   

## 2022-10-11 ENCOUNTER — Other Ambulatory Visit: Payer: Self-pay | Admitting: Cardiovascular Disease

## 2022-10-15 ENCOUNTER — Other Ambulatory Visit: Payer: Self-pay | Admitting: Cardiovascular Disease

## 2022-10-24 ENCOUNTER — Inpatient Hospital Stay: Payer: Medicare Other | Attending: Internal Medicine

## 2022-10-24 ENCOUNTER — Encounter: Payer: Self-pay | Admitting: Internal Medicine

## 2022-10-24 ENCOUNTER — Inpatient Hospital Stay (HOSPITAL_BASED_OUTPATIENT_CLINIC_OR_DEPARTMENT_OTHER): Payer: Medicare Other | Admitting: Internal Medicine

## 2022-10-24 VITALS — BP 133/77 | HR 50 | Temp 96.0°F | Resp 18 | Wt 216.0 lb

## 2022-10-24 DIAGNOSIS — I5030 Unspecified diastolic (congestive) heart failure: Secondary | ICD-10-CM | POA: Diagnosis not present

## 2022-10-24 DIAGNOSIS — D649 Anemia, unspecified: Secondary | ICD-10-CM

## 2022-10-24 DIAGNOSIS — E1122 Type 2 diabetes mellitus with diabetic chronic kidney disease: Secondary | ICD-10-CM | POA: Diagnosis not present

## 2022-10-24 DIAGNOSIS — Z800 Family history of malignant neoplasm of digestive organs: Secondary | ICD-10-CM | POA: Diagnosis not present

## 2022-10-24 DIAGNOSIS — I130 Hypertensive heart and chronic kidney disease with heart failure and stage 1 through stage 4 chronic kidney disease, or unspecified chronic kidney disease: Secondary | ICD-10-CM | POA: Insufficient documentation

## 2022-10-24 DIAGNOSIS — D509 Iron deficiency anemia, unspecified: Secondary | ICD-10-CM | POA: Diagnosis not present

## 2022-10-24 DIAGNOSIS — F17210 Nicotine dependence, cigarettes, uncomplicated: Secondary | ICD-10-CM | POA: Diagnosis not present

## 2022-10-24 DIAGNOSIS — N184 Chronic kidney disease, stage 4 (severe): Secondary | ICD-10-CM | POA: Diagnosis not present

## 2022-10-24 DIAGNOSIS — Z7901 Long term (current) use of anticoagulants: Secondary | ICD-10-CM | POA: Insufficient documentation

## 2022-10-24 DIAGNOSIS — I4891 Unspecified atrial fibrillation: Secondary | ICD-10-CM | POA: Insufficient documentation

## 2022-10-24 LAB — CBC WITH DIFFERENTIAL/PLATELET
Abs Immature Granulocytes: 0.01 10*3/uL (ref 0.00–0.07)
Basophils Absolute: 0.1 10*3/uL (ref 0.0–0.1)
Basophils Relative: 1 %
Eosinophils Absolute: 0 10*3/uL (ref 0.0–0.5)
Eosinophils Relative: 0 %
HCT: 51.3 % (ref 39.0–52.0)
Hemoglobin: 17 g/dL (ref 13.0–17.0)
Immature Granulocytes: 0 %
Lymphocytes Relative: 33 %
Lymphs Abs: 2.4 10*3/uL (ref 0.7–4.0)
MCH: 27.5 pg (ref 26.0–34.0)
MCHC: 33.1 g/dL (ref 30.0–36.0)
MCV: 82.9 fL (ref 80.0–100.0)
Monocytes Absolute: 0.8 10*3/uL (ref 0.1–1.0)
Monocytes Relative: 10 %
Neutro Abs: 4.2 10*3/uL (ref 1.7–7.7)
Neutrophils Relative %: 56 %
Platelets: 206 10*3/uL (ref 150–400)
RBC: 6.19 MIL/uL — ABNORMAL HIGH (ref 4.22–5.81)
RDW: 17.9 % — ABNORMAL HIGH (ref 11.5–15.5)
WBC: 7.5 10*3/uL (ref 4.0–10.5)
nRBC: 0 % (ref 0.0–0.2)

## 2022-10-24 LAB — VITAMIN B12: Vitamin B-12: 1276 pg/mL — ABNORMAL HIGH (ref 180–914)

## 2022-10-24 LAB — IRON AND TIBC
Iron: 84 ug/dL (ref 45–182)
Saturation Ratios: 23 % (ref 17.9–39.5)
TIBC: 364 ug/dL (ref 250–450)
UIBC: 280 ug/dL

## 2022-10-24 LAB — FERRITIN: Ferritin: 112 ng/mL (ref 24–336)

## 2022-10-24 NOTE — Progress Notes (Signed)
Joshua Roy  [phone]) [phone] [phone]) [phone]  ID: Joshua Roy OB: 11/21/1950  MR#: [id number]  [id number]  Patient Care Team: [name], MD as PCP - General (Internal Medicine) Minna Merritts, MD as PCP - Cardiology (Cardiology) Minna Merritts, MD as Consulting Physician (Cardiology)  REFERRING PROVIDER: Dr. [name]  REASON FOR REFERRAL: iron deficiency anemia   HPI: Joshua Roy is a 72 y.o. male with past medical history of HFpEF, CAD s/p CABG, diabetes, hyperlipidemia and A-fib was referred to hematology clinic for further management of iron deficiency anemia.  Received 2 doses of IV Feraheme in September 2023 and responded well. Colonoscopy and endoscopy by Dr. Allen Norris from February 2024 was unremarkable.  INTERVAL HISTORY-  Patient was seen today as follow-up to discuss labs.  His wife was in the hospital for couple of weeks, now on hospice.  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  So he feels tired at times but overall doing well.  Denies any bleeding in the stools.  Has dark stools from his iron.  Takes oral iron 3 times a week.  Gets constipated from it but is manageable.  We discussed about coming off the iron but he would like to wait for the labs to come back.  He is taking B12 supplements.  REVIEW OF SYSTEMS:   Review of Systems  Constitutional:  Positive for malaise/fatigue.    As per HPI. Otherwise, a complete review of systems is negative.  PAST MEDICAL HISTORY: Past Medical History:  Diagnosis Date   (HFpEF) heart failure with preserved ejection fraction ([place])    a. 07/2019 Echo: EF 60-65%. Mod LVH. Gr1 DD. No rwma. Nl RV size/fxn. Mildly dil LA; b. 11/2019 Echo: EF 55-60%, no rwma, mild LVH. Nl RV size/fxn. Mod dil LA.   ACE-inhibitor cough    Anemia    Anxiety    Arrhythmia    atrial flutter   Bronchitis 06/27/2016   Carotid arterial disease ([place])    a. 09/2019  Carotid U/S: RICA 123456, LICA 123456; b. 123456 R CEA.   CHF (congestive heart failure) (HCC)    CKD (chronic kidney disease), stage III (HCC)    COPD (chronic obstructive pulmonary disease) (HCC)    cath, mild inf. hypokinesis EF 49%, 100% ROA   Coronary artery disease    a. 2001 Cath: LM 10, LAD 60p/m, D1 30, LCX 20p, RCA 147m/d-->Med Rx; b. 05/2017 Ex Mv: Ex time 5:46, antlat twi @ rest, fixed infarct w/ mild peri-infarct ischemia. EF 38%; c. 08/2019 Cath: LM 40ost, 70d, LAD 46m, LCX 27m, RCA 80-90p, 185m; d. 09/2019 CABG x4: LIMA->LAD, VG->RI, VG->OM, VG->RPDA.   Diabetes mellitus type II    Hyperlipidemia    Hypertension    MI (myocardial infarction) [facility]) age 32   OA (osteoarthritis)    knee OA, injected 2012 by ortho   PAF (paroxysmal atrial fibrillation) ([place])    a. 09/2019 post-op CABG-->Amio.   Pneumonia    PSVT (paroxysmal supraventricular tachycardia)    a. 08/2019 Zio: 189 brief runs of SVT (fastest 174 x 6 beats, longest 15 beats @ 107).   Tobacco abuse     PAST SURGICAL HISTORY: Past Surgical History:  Procedure Laterality Date   CARDIAC CATHETERIZATION  05/06/2000   @ [place]   CARDIOVERSION N/A 05/22/2022   Procedure: CARDIOVERSION;  Surgeon: Minna Merritts, MD;  Location: [facility] ORS;  Service: Cardiovascular;  Laterality: N/A;   COLONOSCOPY WITH PROPOFOL  N/A 04/20/2018   Procedure: COLONOSCOPY WITH PROPOFOL;  Surgeon: [name], MD;  Location: [facility] ENDOSCOPY;  Service: Endoscopy;  Laterality: N/A;   COLONOSCOPY WITH PROPOFOL N/A 09/02/2022   Procedure: COLONOSCOPY WITH PROPOFOL;  Surgeon: [name], MD;  Location: [facility] ENDOSCOPY;  Service: Endoscopy;  Laterality: N/A;   CORONARY ARTERY BYPASS GRAFT N/A 10/07/2019   Procedure: CORONARY ARTERY BYPASS GRAFTING (CABG) using LIMA to LAD; Endoscopic harvesting of left greater saphenous vein: SVG to RAMUS; SVG to OM1; SVG to PDA.;  Surgeon: Gaye Pollack, MD;  Location: [facility] OR;  Service: Open Heart Surgery;  Laterality: N/A;    ENDARTERECTOMY Right 10/07/2019   Procedure: ENDARTERECTOMY CAROTID;  Surgeon: Rosetta Posner, MD;  Location: [facility] OR;  Service: Vascular;  Laterality: Right;   ENDOVEIN HARVEST OF GREATER SAPHENOUS VEIN Left 10/07/2019   Procedure: [place] Of Greater Saphenous Vein;  Surgeon: Gaye Pollack, MD;  Location: [facility] OR;  Service: Open Heart Surgery;  Laterality: Left;   ESOPHAGOGASTRODUODENOSCOPY N/A 09/02/2022   Procedure: ESOPHAGOGASTRODUODENOSCOPY (EGD);  Surgeon: [name], MD;  Location: [facility] ENDOSCOPY;  Service: Endoscopy;  Laterality: N/A;   ESOPHAGOGASTRODUODENOSCOPY ENDOSCOPY     KNEE ARTHROSCOPY  07/25/04   left   [place]   right, open surgery- repair   RIGHT/LEFT HEART CATH AND CORONARY ANGIOGRAPHY Bilateral 09/08/2019   Procedure: RIGHT/LEFT HEART CATH AND CORONARY ANGIOGRAPHY;  Surgeon: Minna Merritts, MD;  Location: [place] CV LAB;  Service: Cardiovascular;  Laterality: Bilateral;   TEE WITHOUT CARDIOVERSION N/A 10/07/2019   Procedure: TRANSESOPHAGEAL ECHOCARDIOGRAM (TEE);  Surgeon: Gaye Pollack, MD;  Location: [place];  Service: Open Heart Surgery;  Laterality: N/A;   TOTAL KNEE ARTHROPLASTY Left 07/14/2016   Procedure: LEFT TOTAL KNEE ARTHROPLASTY;  Surgeon: Gaynelle Arabian, MD;  Location: [facility] ORS;  Service: Orthopedics;  Laterality: Left;   TOTAL KNEE ARTHROPLASTY Right 07/13/2017   Procedure: RIGHT TOTAL KNEE ARTHROPLASTY;  Surgeon: Gaynelle Arabian, MD;  Location: [facility] ORS;  Service: Orthopedics;  Laterality: Right;    FAMILY HISTORY: Family History  Problem Relation Age of Onset   Heart disease Father        CAD, MI x 3   Hypertension Father    Alcohol abuse Father    Diabetes Father    Diabetes Sister    Cancer Brother        lymphoma, in remission   Colon cancer Brother    Heart disease Sister        CABG x 3   Prostate cancer Neg Hx     HEALTH MAINTENANCE: Social History   Tobacco Use   Smoking status: Every Day    Packs/day: 1.00     Years: 52.00    Additional pack years: 0.00    Total pack years: 52.00    Types: Cigarettes   Smokeless tobacco: Never   Tobacco comments:    per patient wears a patch and has cut down on cigarettes/day (10/05/2019)  Vaping Use   Vaping Use: Never used  Substance Use Topics   Alcohol use: Yes    Alcohol/week: 0.0 standard drinks of alcohol    Comment: 5-6 cocktails, 1 times a week   Drug use: No     Allergies  Allergen Reactions   Ramipril Cough and Other (See Comments)    REACTION: Cough   Pseudoephedrine-Guaifenesin Er Other (See Comments) and Hypertension    Elevated BP, insomnia Elevated BP, insomnia    Current Outpatient Medications  Medication Sig  Dispense Refill   ALPRAZolam (XANAX) 0.25 MG tablet Take 1 tablet (0.25 mg total) by mouth 2 (two) times daily as needed for anxiety. 60 tablet 0   amiodarone (PACERONE) 200 MG tablet Take 1 tablet (200 mg total) by mouth daily. 90 tablet 3   apixaban (ELIQUIS) 5 MG TABS tablet Take 1 tablet (5 mg total) by mouth 2 (two) times daily. 180 tablet 3   atorvastatin (LIPITOR) 80 MG tablet Take 1 tablet (80 mg total) by mouth daily. 90 tablet 3   cyanocobalamin (VITAMIN B12) 1000 MCG tablet Take 1,000 mcg by mouth daily.     dapagliflozin propanediol (FARXIGA) 10 MG TABS tablet Take 1 tablet (10 mg total) by mouth daily at 2 PM. 60 tablet 3   ferrous sulfate 325 (65 FE) MG EC tablet Take 325 mg by mouth every other day.     furosemide (LASIX) 40 MG tablet Take 40 mg by mouth 2 (two) times daily.     glipiZIDE (GLUCOTROL) 5 MG tablet Take 5 mg by mouth daily before breakfast.     glucose blood (TRUE METRIX BLOOD GLUCOSE TEST) test strip 1 each by Other route daily. Use as instructed 100 each 12   metoprolol tartrate (LOPRESSOR) 25 MG tablet Take 1 tablet (25 mg total) by mouth 2 (two) times daily. 180 tablet 3   pantoprazole (PROTONIX) 40 MG tablet TAKE 1 TABLET BY MOUTH DAILY BEFORE BREAKFAST 90 tablet 3   cyclobenzaprine (FLEXERIL) 5  MG tablet Take 5 mg by mouth 3 (three) times daily as needed. (Patient not taking: Reported on 10/24/2022)     traZODone (DESYREL) 50 MG tablet Take 50 mg by mouth at bedtime. (Patient not taking: Reported on 10/24/2022)     No current facility-administered medications for this visit.    OBJECTIVE: Vitals:   10/24/22 1057  BP: 133/77  Pulse: (!) 50  Resp: 18  Temp: (!) 96 F (35.6 C)     Body mass index is 31.9 kg/m.      Physical exam not performed   LAB RESULTS:  Lab Results  Component Value Date   NA 140 05/19/2022   K 3.7 05/19/2022   CL 98 05/19/2022   CO2 30 05/19/2022   GLUCOSE 147 (H) 05/19/2022   BUN 36 (H) 05/19/2022   CREATININE 2.56 (H) 05/19/2022   CALCIUM 9.5 05/19/2022   PROT 6.6 03/18/2022   ALBUMIN 3.8 03/28/2022   AST 18 03/18/2022   ALT 16 03/18/2022   ALKPHOS 25 (L) 12/07/2019   BILITOT 0.6 03/18/2022   GFRNONAA 26 (L) 05/19/2022   GFRAA 39 (L) 06/12/2020    Lab Results  Component Value Date   WBC 7.5 10/24/2022   NEUTROABS 4.2 10/24/2022   HGB 17.0 10/24/2022   HCT 51.3 10/24/2022   MCV 82.9 10/24/2022   PLT 206 10/24/2022    Lab Results  Component Value Date   TIBC 364 05/19/2022   TIBC 594 (H) 04/09/2022   TIBC 613 (H) 03/18/2022   FERRITIN 272 04/09/2022   FERRITIN 10 (L) 03/18/2022   FERRITIN 25 08/13/2021   IRONPCTSAT 17 (L) 05/19/2022   IRONPCTSAT 10 (L) 04/09/2022   IRONPCTSAT 4 (L) 03/18/2022     STUDIES: No results found.  ASSESSMENT AND PLAN:   Joshua Roy is a 72 y.o. male with pmh of HFpEF, CAD s/p CABG, diabetes, hyperlipidemia and A-fib was referred to hematology clinic for further management of iron deficiency anemia.  #Iron deficiency anemia -s/p 2 doses of  IV Feraheme in September 2023.  Had good response.  Hemoglobin improved from around 12-17.  Had colonoscopy and upper endoscopy by Dr. Allen Norris in February 2024 which was unremarkable.  He was advised capsule endoscopy but could not have it because his  wife was sick and now is on hospice.  -CBC reviewed today.  His hemoglobin is stable at 17.  Iron panel is pending. -He is taking oral iron 3 times a week.  Causes constipation but manageable.  Prefers to continue taking it at this time.  B12 level is pending.  On B12 supplements 1000 mcg daily.  #CKD Stage 4 -Follows with Dr. Holley Raring.  He is on diuretics.  #Afib -On Eliquis 5 mg twice daily.  S/p cardioversion   Orders Placed This Encounter  Procedures   CBC with Differential ([place] Only   Iron and TIBC   Ferritin   RTC in 6 months for MD visit, labs  Patient expressed understanding and was in agreement with this plan. He also understands that He can call clinic at any time with any questions, concerns, or complaints.   I spent a total of 25 minutes reviewing chart data, face-to-face evaluation with the patient, counseling and coordination of care as detailed above.  Jane Canary, MD   10/24/2022 11:44 AM

## 2022-10-24 NOTE — Progress Notes (Signed)
Patient had been feeling much better. 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 No visible blood in stool. Denies dyspnea or dizziness.

## 2022-10-24 NOTE — Addendum Note (Signed)
Addended byJane Canary on: 10/24/2022 11:46 AM   Modules accepted: Orders

## 2022-10-30 DIAGNOSIS — N184 Chronic kidney disease, stage 4 (severe): Secondary | ICD-10-CM | POA: Diagnosis not present

## 2022-10-30 DIAGNOSIS — E1122 Type 2 diabetes mellitus with diabetic chronic kidney disease: Secondary | ICD-10-CM | POA: Diagnosis not present

## 2022-11-03 ENCOUNTER — Other Ambulatory Visit: Payer: Self-pay | Admitting: Cardiovascular Disease

## 2022-11-03 DIAGNOSIS — R809 Proteinuria, unspecified: Secondary | ICD-10-CM | POA: Diagnosis not present

## 2022-11-03 DIAGNOSIS — N2581 Secondary hyperparathyroidism of renal origin: Secondary | ICD-10-CM | POA: Diagnosis not present

## 2022-11-03 DIAGNOSIS — N184 Chronic kidney disease, stage 4 (severe): Secondary | ICD-10-CM | POA: Diagnosis not present

## 2022-11-03 DIAGNOSIS — I10 Essential (primary) hypertension: Secondary | ICD-10-CM | POA: Diagnosis not present

## 2022-11-03 DIAGNOSIS — E1122 Type 2 diabetes mellitus with diabetic chronic kidney disease: Secondary | ICD-10-CM | POA: Diagnosis not present

## 2023-01-06 DIAGNOSIS — E1122 Type 2 diabetes mellitus with diabetic chronic kidney disease: Secondary | ICD-10-CM | POA: Diagnosis not present

## 2023-01-06 DIAGNOSIS — N184 Chronic kidney disease, stage 4 (severe): Secondary | ICD-10-CM | POA: Diagnosis not present

## 2023-01-08 DIAGNOSIS — E1122 Type 2 diabetes mellitus with diabetic chronic kidney disease: Secondary | ICD-10-CM | POA: Diagnosis not present

## 2023-01-08 DIAGNOSIS — R600 Localized edema: Secondary | ICD-10-CM | POA: Diagnosis not present

## 2023-01-08 DIAGNOSIS — N2581 Secondary hyperparathyroidism of renal origin: Secondary | ICD-10-CM | POA: Diagnosis not present

## 2023-01-08 DIAGNOSIS — I10 Essential (primary) hypertension: Secondary | ICD-10-CM | POA: Diagnosis not present

## 2023-01-08 DIAGNOSIS — N184 Chronic kidney disease, stage 4 (severe): Secondary | ICD-10-CM | POA: Diagnosis not present

## 2023-01-20 DIAGNOSIS — I10 Essential (primary) hypertension: Secondary | ICD-10-CM | POA: Diagnosis not present

## 2023-01-20 DIAGNOSIS — E1159 Type 2 diabetes mellitus with other circulatory complications: Secondary | ICD-10-CM | POA: Diagnosis not present

## 2023-01-27 DIAGNOSIS — I2510 Atherosclerotic heart disease of native coronary artery without angina pectoris: Secondary | ICD-10-CM | POA: Diagnosis not present

## 2023-01-27 DIAGNOSIS — I130 Hypertensive heart and chronic kidney disease with heart failure and stage 1 through stage 4 chronic kidney disease, or unspecified chronic kidney disease: Secondary | ICD-10-CM | POA: Diagnosis not present

## 2023-01-27 DIAGNOSIS — N184 Chronic kidney disease, stage 4 (severe): Secondary | ICD-10-CM | POA: Diagnosis not present

## 2023-01-27 DIAGNOSIS — J449 Chronic obstructive pulmonary disease, unspecified: Secondary | ICD-10-CM | POA: Diagnosis not present

## 2023-01-27 DIAGNOSIS — I509 Heart failure, unspecified: Secondary | ICD-10-CM | POA: Diagnosis not present

## 2023-01-27 DIAGNOSIS — F17210 Nicotine dependence, cigarettes, uncomplicated: Secondary | ICD-10-CM | POA: Diagnosis not present

## 2023-01-27 DIAGNOSIS — E1122 Type 2 diabetes mellitus with diabetic chronic kidney disease: Secondary | ICD-10-CM | POA: Diagnosis not present

## 2023-01-27 DIAGNOSIS — Z0000 Encounter for general adult medical examination without abnormal findings: Secondary | ICD-10-CM | POA: Diagnosis not present

## 2023-01-27 DIAGNOSIS — I4819 Other persistent atrial fibrillation: Secondary | ICD-10-CM | POA: Diagnosis not present

## 2023-04-14 DIAGNOSIS — N184 Chronic kidney disease, stage 4 (severe): Secondary | ICD-10-CM | POA: Diagnosis not present

## 2023-04-14 DIAGNOSIS — Z23 Encounter for immunization: Secondary | ICD-10-CM | POA: Diagnosis not present

## 2023-04-14 DIAGNOSIS — E1122 Type 2 diabetes mellitus with diabetic chronic kidney disease: Secondary | ICD-10-CM | POA: Diagnosis not present

## 2023-04-28 ENCOUNTER — Inpatient Hospital Stay: Payer: Medicare Other | Admitting: Internal Medicine

## 2023-04-28 ENCOUNTER — Inpatient Hospital Stay: Payer: Medicare Other | Attending: Internal Medicine

## 2023-04-28 DIAGNOSIS — Z7901 Long term (current) use of anticoagulants: Secondary | ICD-10-CM | POA: Insufficient documentation

## 2023-04-28 DIAGNOSIS — D509 Iron deficiency anemia, unspecified: Secondary | ICD-10-CM | POA: Insufficient documentation

## 2023-04-28 DIAGNOSIS — I480 Paroxysmal atrial fibrillation: Secondary | ICD-10-CM | POA: Insufficient documentation

## 2023-04-28 DIAGNOSIS — F17210 Nicotine dependence, cigarettes, uncomplicated: Secondary | ICD-10-CM | POA: Insufficient documentation

## 2023-04-28 DIAGNOSIS — Z807 Family history of other malignant neoplasms of lymphoid, hematopoietic and related tissues: Secondary | ICD-10-CM | POA: Insufficient documentation

## 2023-04-28 DIAGNOSIS — E1122 Type 2 diabetes mellitus with diabetic chronic kidney disease: Secondary | ICD-10-CM | POA: Insufficient documentation

## 2023-04-28 DIAGNOSIS — Z800 Family history of malignant neoplasm of digestive organs: Secondary | ICD-10-CM | POA: Insufficient documentation

## 2023-04-28 DIAGNOSIS — N184 Chronic kidney disease, stage 4 (severe): Secondary | ICD-10-CM | POA: Insufficient documentation

## 2023-05-01 ENCOUNTER — Inpatient Hospital Stay (HOSPITAL_BASED_OUTPATIENT_CLINIC_OR_DEPARTMENT_OTHER): Payer: Medicare Other | Admitting: Internal Medicine

## 2023-05-01 ENCOUNTER — Inpatient Hospital Stay: Payer: Medicare Other

## 2023-05-01 VITALS — BP 128/73 | HR 55 | Temp 98.7°F | Wt 210.0 lb

## 2023-05-01 DIAGNOSIS — E1122 Type 2 diabetes mellitus with diabetic chronic kidney disease: Secondary | ICD-10-CM | POA: Diagnosis not present

## 2023-05-01 DIAGNOSIS — I480 Paroxysmal atrial fibrillation: Secondary | ICD-10-CM | POA: Diagnosis not present

## 2023-05-01 DIAGNOSIS — Z7901 Long term (current) use of anticoagulants: Secondary | ICD-10-CM | POA: Diagnosis not present

## 2023-05-01 DIAGNOSIS — Z807 Family history of other malignant neoplasms of lymphoid, hematopoietic and related tissues: Secondary | ICD-10-CM | POA: Diagnosis not present

## 2023-05-01 DIAGNOSIS — Z800 Family history of malignant neoplasm of digestive organs: Secondary | ICD-10-CM | POA: Diagnosis not present

## 2023-05-01 DIAGNOSIS — D509 Iron deficiency anemia, unspecified: Secondary | ICD-10-CM

## 2023-05-01 DIAGNOSIS — Z8639 Personal history of other endocrine, nutritional and metabolic disease: Secondary | ICD-10-CM | POA: Insufficient documentation

## 2023-05-01 DIAGNOSIS — N184 Chronic kidney disease, stage 4 (severe): Secondary | ICD-10-CM | POA: Diagnosis not present

## 2023-05-01 DIAGNOSIS — D649 Anemia, unspecified: Secondary | ICD-10-CM

## 2023-05-01 DIAGNOSIS — F17210 Nicotine dependence, cigarettes, uncomplicated: Secondary | ICD-10-CM | POA: Diagnosis not present

## 2023-05-01 LAB — FERRITIN: Ferritin: 122 ng/mL (ref 24–336)

## 2023-05-01 LAB — IRON AND TIBC
Iron: 81 ug/dL (ref 45–182)
Saturation Ratios: 24 % (ref 17.9–39.5)
TIBC: 340 ug/dL (ref 250–450)
UIBC: 259 ug/dL

## 2023-05-01 LAB — CBC WITH DIFFERENTIAL (CANCER CENTER ONLY)
Abs Immature Granulocytes: 0.03 10*3/uL (ref 0.00–0.07)
Basophils Absolute: 0.1 10*3/uL (ref 0.0–0.1)
Basophils Relative: 1 %
Eosinophils Absolute: 0 10*3/uL (ref 0.0–0.5)
Eosinophils Relative: 0 %
HCT: 50.5 % (ref 39.0–52.0)
Hemoglobin: 16.8 g/dL (ref 13.0–17.0)
Immature Granulocytes: 0 %
Lymphocytes Relative: 34 %
Lymphs Abs: 2.6 10*3/uL (ref 0.7–4.0)
MCH: 28.4 pg (ref 26.0–34.0)
MCHC: 33.3 g/dL (ref 30.0–36.0)
MCV: 85.4 fL (ref 80.0–100.0)
Monocytes Absolute: 0.6 10*3/uL (ref 0.1–1.0)
Monocytes Relative: 8 %
Neutro Abs: 4.5 10*3/uL (ref 1.7–7.7)
Neutrophils Relative %: 57 %
Platelet Count: 190 10*3/uL (ref 150–400)
RBC: 5.91 MIL/uL — ABNORMAL HIGH (ref 4.22–5.81)
RDW: 15 % (ref 11.5–15.5)
WBC Count: 7.9 10*3/uL (ref 4.0–10.5)
nRBC: 0 % (ref 0.0–0.2)

## 2023-05-01 LAB — VITAMIN B12: Vitamin B-12: 1352 pg/mL — ABNORMAL HIGH (ref 180–914)

## 2023-05-01 MED ORDER — FERROUS SULFATE 325 (65 FE) MG PO TBEC: 325.0000 mg | DELAYED_RELEASE_TABLET | ORAL | 2 refills | Status: AC

## 2023-05-01 NOTE — Progress Notes (Signed)
Patient would like to start taking the B-12 injections instead of the pill form. Plus he is starting to feel more fatigued.

## 2023-05-01 NOTE — Progress Notes (Signed)
[facility]  [phone] [phone]) [phone]  ID: Vaughan Mowat Rieman OB: 1950/11/18  MR#: [id number]  AOZ#:[id number]  Patient Care Team: Corky Downs, MD as PCP - General (Internal Medicine) Mariah Milling Tollie Pizza, MD as PCP - Cardiology (Cardiology) Antonieta Iba, MD as Consulting Physician (Cardiology) Michaelyn Barter, MD as Consulting Physician (Oncology)  REFERRING PROVIDER: Dr. Juel Burrow  REASON FOR REFERRAL: iron deficiency anemia   HPI: Joshua Roy is a 72 y.o. male with past medical history of HFpEF, CAD s/p CABG, diabetes, hyperlipidemia and A-fib was referred to hematology clinic for further management of iron deficiency anemia.  Received 2 doses of IV Feraheme in September 2023 and responded well. Colonoscopy and endoscopy by Dr. Servando Snare from February 2024 was unremarkable.  INTERVAL HISTORY-  Patient was seen today as follow-up for iron deficiency anemia and labs. Reports decrease in some energy level for past couple of months.  Otherwise has been doing well.  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    REVIEW OF SYSTEMS:   Review of Systems  Constitutional:  Positive for malaise/fatigue.    As per HPI. Otherwise, a complete review of systems is negative.  PAST MEDICAL HISTORY: Past Medical History:  Diagnosis Date   (HFpEF) heart failure with preserved ejection fraction (HCC)    a. 07/2019 Echo: EF 60-65%. Mod LVH. Gr1 DD. No rwma. Nl RV size/fxn. Mildly dil LA; b. 11/2019 Echo: EF 55-60%, no rwma, mild LVH. Nl RV size/fxn. Mod dil LA.   ACE-inhibitor cough    Anemia    Anxiety    Arrhythmia    atrial flutter   Bronchitis 06/27/2016   Carotid arterial disease (HCC)    a. 09/2019 Carotid U/S: RICA 80-99%, LICA 1-39%; b. 09/2019 R CEA.   CHF (congestive heart failure) (HCC)    CKD (chronic kidney disease), stage III (HCC)    COPD (chronic obstructive pulmonary disease) (HCC)    cath, mild inf. hypokinesis EF 49%, 100% ROA    Coronary artery disease    a. 2001 Cath: LM 10, LAD 60p/m, D1 30, LCX 20p, RCA 144m/d-->Med Rx; b. 05/2017 Ex Mv: Ex time 5:46, antlat twi @ rest, fixed infarct w/ mild peri-infarct ischemia. EF 38%; c. 08/2019 Cath: LM 40ost, 70d, LAD 65m, LCX 65m, RCA 80-90p, 141m; d. 09/2019 CABG x4: LIMA->LAD, VG->RI, VG->OM, VG->RPDA.   Diabetes mellitus type II    Hyperlipidemia    Hypertension    MI (myocardial infarction) [facility]) age 9   OA (osteoarthritis)    knee OA, injected 2012 by ortho   PAF (paroxysmal atrial fibrillation) (HCC)    a. 09/2019 post-op CABG-->Amio.   Pneumonia    PSVT (paroxysmal supraventricular tachycardia)    a. 08/2019 Zio: 189 brief runs of SVT (fastest 174 x 6 beats, longest 15 beats @ 107).   Tobacco abuse     PAST SURGICAL HISTORY: Past Surgical History:  Procedure Laterality Date   CARDIAC CATHETERIZATION  05/06/2000   @ [facility]   CARDIOVERSION N/A 05/22/2022   Procedure: CARDIOVERSION;  Surgeon: Antonieta Iba, MD;  Location: [facility] ORS;  Service: Cardiovascular;  Laterality: N/A;   COLONOSCOPY WITH PROPOFOL N/A 04/20/2018   Procedure: COLONOSCOPY WITH PROPOFOL;  Surgeon: Midge Minium, MD;  Location: [facility] ENDOSCOPY;  Service: Endoscopy;  Laterality: N/A;   COLONOSCOPY WITH PROPOFOL N/A 09/02/2022   Procedure: COLONOSCOPY WITH PROPOFOL;  Surgeon: Midge Minium, MD;  Location: [facility] ENDOSCOPY;  Service: Endoscopy;  Laterality: N/A;   CORONARY ARTERY BYPASS GRAFT N/A 10/07/2019  Procedure: CORONARY ARTERY BYPASS GRAFTING (CABG) using LIMA to LAD; Endoscopic harvesting of left greater saphenous vein: SVG to RAMUS; SVG to OM1; SVG to PDA.;  Surgeon: Alleen Borne, MD;  Location: [facility] OR;  Service: Open Heart Surgery;  Laterality: N/A;   ENDARTERECTOMY Right 10/07/2019   Procedure: ENDARTERECTOMY CAROTID;  Surgeon: Larina Earthly, MD;  Location: [facility] OR;  Service: Vascular;  Laterality: Right;   ENDOVEIN HARVEST OF GREATER SAPHENOUS VEIN Left 10/07/2019   Procedure: Mack Guise  Of Greater Saphenous Vein;  Surgeon: Alleen Borne, MD;  Location: [facility] OR;  Service: Open Heart Surgery;  Laterality: Left;   ESOPHAGOGASTRODUODENOSCOPY N/A 09/02/2022   Procedure: ESOPHAGOGASTRODUODENOSCOPY (EGD);  Surgeon: Midge Minium, MD;  Location: [facility] ENDOSCOPY;  Service: Endoscopy;  Laterality: N/A;   ESOPHAGOGASTRODUODENOSCOPY ENDOSCOPY     KNEE ARTHROSCOPY  07/25/04   left   KNEE SURGERY  1969   right, open surgery- repair   RIGHT/LEFT HEART CATH AND CORONARY ANGIOGRAPHY Bilateral 09/08/2019   Procedure: RIGHT/LEFT HEART CATH AND CORONARY ANGIOGRAPHY;  Surgeon: Antonieta Iba, MD;  Location: [facility] INVASIVE CV LAB;  Service: Cardiovascular;  Laterality: Bilateral;   TEE WITHOUT CARDIOVERSION N/A 10/07/2019   Procedure: TRANSESOPHAGEAL ECHOCARDIOGRAM (TEE);  Surgeon: Alleen Borne, MD;  Location: [facility] OR;  Service: Open Heart Surgery;  Laterality: N/A;   TOTAL KNEE ARTHROPLASTY Left 07/14/2016   Procedure: LEFT TOTAL KNEE ARTHROPLASTY;  Surgeon: Ollen Gross, MD;  Location: [facility] ORS;  Service: Orthopedics;  Laterality: Left;   TOTAL KNEE ARTHROPLASTY Right 07/13/2017   Procedure: RIGHT TOTAL KNEE ARTHROPLASTY;  Surgeon: Ollen Gross, MD;  Location: [facility] ORS;  Service: Orthopedics;  Laterality: Right;    FAMILY HISTORY: Family History  Problem Relation Age of Onset   Heart disease Father        CAD, MI x 3   Hypertension Father    Alcohol abuse Father    Diabetes Father    Diabetes Sister    Cancer Brother        lymphoma, in remission   Colon cancer Brother    Heart disease Sister        CABG x 3   Prostate cancer Neg Hx     HEALTH MAINTENANCE: Social History   Tobacco Use   Smoking status: Every Day    Current packs/day: 1.00    Average packs/day: 1 pack/day for 52.0 years (52.0 ttl pk-yrs)    Types: Cigarettes   Smokeless tobacco: Never   Tobacco comments:    per patient wears a patch and has cut down on cigarettes/day (10/05/2019)  Vaping Use   Vaping status:  Never Used  Substance Use Topics   Alcohol use: Yes    Alcohol/week: 0.0 standard drinks of alcohol    Comment: 5-6 cocktails, 1 times a week   Drug use: No     Allergies  Allergen Reactions   Ramipril Cough and Other (See Comments)    REACTION: Cough   Pseudoephedrine-Guaifenesin Er Other (See Comments) and Hypertension    Elevated BP, insomnia Elevated BP, insomnia    Current Outpatient Medications  Medication Sig Dispense Refill   ALPRAZolam (XANAX) 0.25 MG tablet Take 1 tablet (0.25 mg total) by mouth 2 (two) times daily as needed for anxiety. 60 tablet 0   amiodarone (PACERONE) 200 MG tablet Take 1 tablet (200 mg total) by mouth daily. 90 tablet 3   apixaban (ELIQUIS) 5 MG TABS tablet Take 1 tablet (5 mg total) by mouth 2 (two) times  daily. 180 tablet 3   atorvastatin (LIPITOR) 80 MG tablet Take 1 tablet (80 mg total) by mouth daily. 90 tablet 3   cyanocobalamin (VITAMIN B12) 1000 MCG tablet Take 1,000 mcg by mouth daily.     dapagliflozin propanediol (FARXIGA) 10 MG TABS tablet Take 1 tablet (10 mg total) by mouth daily at 2 PM. 60 tablet 3   furosemide (LASIX) 40 MG tablet TAKE 1 TABLET BY MOUTH TWICE A DAY 180 tablet 3   glipiZIDE (GLUCOTROL) 5 MG tablet Take 5 mg by mouth daily before breakfast.     glucose blood (TRUE METRIX BLOOD GLUCOSE TEST) test strip 1 each by Other route daily. Use as instructed 100 each 12   metoprolol tartrate (LOPRESSOR) 25 MG tablet Take 1 tablet (25 mg total) by mouth 2 (two) times daily. 180 tablet 3   pantoprazole (PROTONIX) 40 MG tablet TAKE 1 TABLET BY MOUTH DAILY BEFORE BREAKFAST 90 tablet 3   cyclobenzaprine (FLEXERIL) 5 MG tablet Take 5 mg by mouth 3 (three) times daily as needed. (Patient not taking: Reported on 10/24/2022)     ferrous sulfate 325 (65 FE) MG EC tablet Take 1 tablet (325 mg total) by mouth every other day. 45 tablet 2   traZODone (DESYREL) 50 MG tablet Take 50 mg by mouth at bedtime. (Patient not taking: Reported on  10/24/2022)     No current facility-administered medications for this visit.    OBJECTIVE: Vitals:   05/01/23 1054  BP: 128/73  Pulse: (!) 55  Temp: 98.7 F (37.1 C)  SpO2: 98%      Body mass index is 31.01 kg/m.      Physical exam not performed   LAB RESULTS:  Lab Results  Component Value Date   NA 140 05/19/2022   K 3.7 05/19/2022   CL 98 05/19/2022   CO2 30 05/19/2022   GLUCOSE 147 (H) 05/19/2022   BUN 36 (H) 05/19/2022   CREATININE 2.56 (H) 05/19/2022   CALCIUM 9.5 05/19/2022   PROT 6.6 03/18/2022   ALBUMIN 3.8 03/28/2022   AST 18 03/18/2022   ALT 16 03/18/2022   ALKPHOS 25 (L) 12/07/2019   BILITOT 0.6 03/18/2022   GFRNONAA 26 (L) 05/19/2022   GFRAA 39 (L) 06/12/2020    Lab Results  Component Value Date   WBC 7.9 05/01/2023   NEUTROABS 4.5 05/01/2023   HGB 16.8 05/01/2023   HCT 50.5 05/01/2023   MCV 85.4 05/01/2023   PLT 190 05/01/2023    Lab Results  Component Value Date   TIBC 364 10/24/2022   TIBC 364 05/19/2022   TIBC 594 (H) 04/09/2022   FERRITIN 112 10/24/2022   FERRITIN 272 04/09/2022   FERRITIN 10 (L) 03/18/2022   IRONPCTSAT 23 10/24/2022   IRONPCTSAT 17 (L) 05/19/2022   IRONPCTSAT 10 (L) 04/09/2022     STUDIES: No results found.  ASSESSMENT AND PLAN:   IMARION BEATY is a 72 y.o. male with pmh of HFpEF, CAD s/p CABG, diabetes, hyperlipidemia and A-fib was referred to hematology clinic for further management of iron deficiency anemia.  #Iron deficiency anemia -s/p 2 doses of IV Feraheme in September 2023.  Had good response.  Hemoglobin improved from around 12-17.  Had colonoscopy and upper endoscopy by Dr. Servando Snare in February 2024 which was unremarkable.  He was advised capsule endoscopy due to other family commitments.  -Currently his hemoglobin has been normal.  Iron panel from last visit was normal.  It is pending from today.  No  iron infusion today. -He is taking oral iron 3 times a week.  Causes constipation but  manageable.  Prefers to continue taking it at this time.  # History of vitamin B12 deficiency -B12 level 1200.  Continue with B12 supplements 1000 mcg daily.  #CKD Stage 4 -Follows with Dr. Cherylann Ratel.  He is on diuretics.  #Afib -On Eliquis 5 mg twice daily.  S/p cardioversion   No orders of the defined types were placed in this encounter.  RTC as needed  Patient expressed understanding and was in agreement with this plan. He also understands that He can call clinic at any time with any questions, concerns, or complaints.   I spent a total of 25 minutes reviewing chart data, face-to-face evaluation with the patient, counseling and coordination of care as detailed above.  Michaelyn Barter, MD   05/01/2023 12:24 PM

## 2023-05-05 DIAGNOSIS — E1122 Type 2 diabetes mellitus with diabetic chronic kidney disease: Secondary | ICD-10-CM | POA: Diagnosis not present

## 2023-05-05 DIAGNOSIS — N2581 Secondary hyperparathyroidism of renal origin: Secondary | ICD-10-CM | POA: Diagnosis not present

## 2023-05-05 DIAGNOSIS — I10 Essential (primary) hypertension: Secondary | ICD-10-CM | POA: Diagnosis not present

## 2023-05-05 DIAGNOSIS — N184 Chronic kidney disease, stage 4 (severe): Secondary | ICD-10-CM | POA: Diagnosis not present

## 2023-05-05 DIAGNOSIS — R809 Proteinuria, unspecified: Secondary | ICD-10-CM | POA: Diagnosis not present

## 2023-05-07 ENCOUNTER — Telehealth: Payer: Self-pay | Admitting: Cardiovascular Disease

## 2023-05-07 NOTE — Telephone Encounter (Signed)
[facility] called to talk with Dr. Mariah Milling or nurse regarding patient's medication

## 2023-05-07 NOTE — Telephone Encounter (Signed)
Called patient and left a message for a call back.  

## 2023-05-07 NOTE — Telephone Encounter (Signed)
The pharmacist called in from [facility] asking for a verbal order for Eliquis 5 mg twice daily. They were advised that we will call them back or have the patient send in a faxed script if this is what he is requesting.

## 2023-05-08 ENCOUNTER — Telehealth: Payer: Self-pay | Admitting: Cardiovascular Disease

## 2023-05-08 DIAGNOSIS — Z23 Encounter for immunization: Secondary | ICD-10-CM | POA: Diagnosis not present

## 2023-05-08 DIAGNOSIS — I4891 Unspecified atrial fibrillation: Secondary | ICD-10-CM

## 2023-05-08 NOTE — Telephone Encounter (Signed)
Rx can be printed and given to patient. Patient is then free to give to pharmacy of their choice.

## 2023-05-08 NOTE — Telephone Encounter (Signed)
Please review

## 2023-05-08 NOTE — Telephone Encounter (Signed)
Left a message for the patient to call back.  

## 2023-05-08 NOTE — Telephone Encounter (Signed)
*  STAT* If patient is at the pharmacy, call can be transferred to refill team.   1. Which medications need to be refilled? (please list name of each medication and dose if known) apixaban (ELIQUIS) 5 MG TABS tablet   2. Which pharmacy/location (including street and city if local pharmacy) is medication to be sent to?  [phone] [facility]   3. Do they need a 30 day or 90 day supply? 90

## 2023-05-12 NOTE — Telephone Encounter (Signed)
Called patient and left message for call back.

## 2023-05-14 NOTE — Telephone Encounter (Signed)
Called patient and no answer at this time

## 2023-05-15 NOTE — Telephone Encounter (Signed)
Pt states the pharmacy would not give him his medication unless the refill was faxed to them. Pt states he is almost out of the medication. [phone] is the fax number.  Please advise

## 2023-05-15 NOTE — Telephone Encounter (Signed)
Called patient and left message for call back.

## 2023-05-20 MED ORDER — APIXABAN 5 MG PO TABS: 5.0000 mg | ORAL_TABLET | Freq: Two times a day (BID) | ORAL | 3 refills | Status: DC

## 2023-05-20 NOTE — Telephone Encounter (Signed)
Prescription has been signed and left up front for him.

## 2023-05-20 NOTE — Telephone Encounter (Signed)
Prescription has been printed and message sent to Dr. Mariah Milling to see if he will be able to sign this today.  The patient has been called multiple times and a MyChart message sent. Unfortunately, we are unable to fax this to [facility] [place]. This will have to be done by the patient.

## 2023-05-20 NOTE — Telephone Encounter (Signed)
Pt c/o medication issue:  1. Name of Medication: apixaban (ELIQUIS) 5 MG TABS tablet   2. How are you currently taking this medication (dosage and times per day)? As prescribed   3. Are you having a reaction (difficulty breathing--STAT)? No   4. What is your medication issue? Patient is calling stating he is coming to the office at 2:00 pm today, 10/23 to physically pick up the 90 day prescription to get it to [facility] himself. Patient is upset by the office not completing this as he has called multiple times regarding it. He is on a time crunch to pick up the prescription today due to having to have someone come sit with his disabled wife for him to be able to pick this up. He is requesting the prescription be at the front desk waiting for him when he arrives due to this. Verbalized to patient I was sending a message requesting it be ready and apologized for the inconvenience. He states he only has a week supply of the medication left. Please advise.

## 2023-05-25 ENCOUNTER — Telehealth: Payer: Self-pay | Admitting: Cardiovascular Disease

## 2023-05-25 DIAGNOSIS — I4891 Unspecified atrial fibrillation: Secondary | ICD-10-CM

## 2023-05-25 MED ORDER — APIXABAN 5 MG PO TABS: 5.0000 mg | ORAL_TABLET | Freq: Two times a day (BID) | ORAL | 3 refills | Status: DC

## 2023-05-25 NOTE — Telephone Encounter (Signed)
See previous encounter. Patient is following up because he states he faxed his prescription to [facility], but they did not receive it. He states he threw it away and would like to know if he can go by the office to pick up another copy. Please advise.   *STAT* If patient is at the pharmacy, call can be transferred to refill team.   1. Which medications need to be refilled? (please list name of each medication and dose if known)  Eliquis  2. Which pharmacy/location (including street and city if local pharmacy) is medication to be sent to? Written Rx for patient  3. Do they need a 30 day or 90 day supply?  90 day supply

## 2023-05-25 NOTE — Telephone Encounter (Signed)
Called patient and left a message. Prescription printed and placed in office for pick up.

## 2023-06-02 DIAGNOSIS — H52223 Regular astigmatism, bilateral: Secondary | ICD-10-CM | POA: Diagnosis not present

## 2023-06-02 DIAGNOSIS — E119 Type 2 diabetes mellitus without complications: Secondary | ICD-10-CM | POA: Diagnosis not present

## 2023-06-08 NOTE — Progress Notes (Unsigned)
This cardiology Office Note  Date:  06/09/2023   ID:  TORA RHATIGAN, DOB 11-Feb-1951, MRN [id number]  PCP:  Jerl Mina, MD   Chief Complaint  Patient presents with   6 month follow up     "Doing well." Medications reviewed by the patient verbally.     HPI:  Mr. Joshua Roy is 72 year old gentleman with a history of  coronary artery disease,  occluded mid to distal RCA  by cardiac catheterization in 2001,  residual 60% proximal to mid LAD disease at that time, 30% diagonal disease, 10% left main disease, 20% proximal left circumflex disease.  bypass surgery March 2021,  Active smoker, <1 ppd hyperlipidemia,  obesity and diabetes  PAD, carotid stenosis, s/p carotid endarterectomy March 2021, postoperative atrial fibrillation Admission to the hospital for CHF August 2023 Diabetes Chronic renal sufficiency, followed by nephrology, creatinine greater than 3 He presents today for follow-up of his coronary artery disease, CHF, hx of CABG  Last seen by myself in clinic March 2024 Cardioversion for afib 05/22/22 On his last clinic visit reported 40 pounds weight loss in 6 months through dietary changes, walking, completed PT  On today's visit reports his weight continues to drop, feels he is down 50 pounds Weight down 6 pounds from his prior clinic visit March 2024 Continues to walk for exercise, diet restriction Busy at home, hopes to take care of his wife who has terminal pulmonary fibrosis on 12 L oxygen  Denies tachypalpitations concerning for arrhythmia No chest pain concerning for angina Reports he is taking Lasix 80 in the morning, 40 in the p.m.(we only had 40 twice daily)  Lab work reviewed Creatinine 2.67 BUN 36 potassium 4.3 Hemoglobin 16.9 A1c 7.1 Total cholesterol 138 LDL 75  EKG personally reviewed by myself on todays visit EKG Interpretation Date/Time:  Tuesday June 09 2023 14:21:24 EST Ventricular Rate:  50 PR Interval:  280 QRS  Duration:  112 QT Interval:  460 QTC Calculation: 419 R Axis:   88  Text Interpretation: Sinus bradycardia with 1st degree A-V block Septal infarct , age undetermined T wave abnormality, consider inferior ischemia T wave abnormality, consider anterolateral ischemia When compared with ECG of 22-May-2022 07:58, rate has slowed Confirmed by Julien Nordmann [phone]) on 06/09/2023 2:25:01 PM   Other past medical history reviewed 8/23 hospital admission for acute on chronic diastolic CHF gained approximately 20 pounds.    echocardiogram 04/01/2022-ejection fraction 60 to 65%.  Severe LVH.  Right ventricular systolic function is mildly reduced.  Severely elevated pulmonary artery systolic pressure.  CKD stage IV-creatinine 3.25 from 3.51 from 3.62 from 3.94 on Lasix drip.  His creatinine was 3.25 on discharge.    long hospital course April 2021 Multiorgan failure   Past medical history reviewed  May 2021, CHF exacerbation, sent to the hospital multiorgan failure COPD, anemia, acute on chronic kidney failure, difficulty with diuresis in the setting of CHF,  Management limited by his anxiety and desire to go home Chronic hypoxia saturations in the 80s on 10 L oxygen throughout his hospital course -Also treated for bronchitis Poorly controlled diabetes   PMH:   has a past medical history of (HFpEF) heart failure with preserved ejection fraction (HCC), ACE-inhibitor cough, Anemia, Anxiety, Arrhythmia, Bronchitis (06/27/2016), Carotid arterial disease (HCC), CHF (congestive heart failure) (HCC), CKD (chronic kidney disease), stage III (HCC), COPD (chronic obstructive pulmonary disease) (HCC), Coronary artery disease, Diabetes mellitus type II, Hyperlipidemia, Hypertension, MI (myocardial infarction) (HCC) (age 70), OA (osteoarthritis), PAF (paroxysmal atrial fibrillation) (  HCC), Pneumonia, PSVT (paroxysmal supraventricular tachycardia) (HCC), and Tobacco abuse.  PSH:    Past Surgical History:   Procedure Laterality Date   CARDIAC CATHETERIZATION  05/06/2000   @ [facility]   CARDIOVERSION N/A 05/22/2022   Procedure: CARDIOVERSION;  Surgeon: Antonieta Iba, MD;  Location: [facility] ORS;  Service: Cardiovascular;  Laterality: N/A;   COLONOSCOPY WITH PROPOFOL N/A 04/20/2018   Procedure: COLONOSCOPY WITH PROPOFOL;  Surgeon: Midge Minium, MD;  Location: [facility] ENDOSCOPY;  Service: Endoscopy;  Laterality: N/A;   COLONOSCOPY WITH PROPOFOL N/A 09/02/2022   Procedure: COLONOSCOPY WITH PROPOFOL;  Surgeon: Midge Minium, MD;  Location: [facility] ENDOSCOPY;  Service: Endoscopy;  Laterality: N/A;   CORONARY ARTERY BYPASS GRAFT N/A 10/07/2019   Procedure: CORONARY ARTERY BYPASS GRAFTING (CABG) using LIMA to LAD; Endoscopic harvesting of left greater saphenous vein: SVG to RAMUS; SVG to OM1; SVG to PDA.;  Surgeon: Alleen Borne, MD;  Location: [facility] OR;  Service: Open Heart Surgery;  Laterality: N/A;   ENDARTERECTOMY Right 10/07/2019   Procedure: ENDARTERECTOMY CAROTID;  Surgeon: Larina Earthly, MD;  Location: [facility] OR;  Service: Vascular;  Laterality: Right;   ENDOVEIN HARVEST OF GREATER SAPHENOUS VEIN Left 10/07/2019   Procedure: Mack Guise Of Greater Saphenous Vein;  Surgeon: Alleen Borne, MD;  Location: [facility] OR;  Service: Open Heart Surgery;  Laterality: Left;   ESOPHAGOGASTRODUODENOSCOPY N/A 09/02/2022   Procedure: ESOPHAGOGASTRODUODENOSCOPY (EGD);  Surgeon: Midge Minium, MD;  Location: [facility] ENDOSCOPY;  Service: Endoscopy;  Laterality: N/A;   ESOPHAGOGASTRODUODENOSCOPY ENDOSCOPY     KNEE ARTHROSCOPY  07/25/04   left   KNEE SURGERY  1969   right, open surgery- repair   RIGHT/LEFT HEART CATH AND CORONARY ANGIOGRAPHY Bilateral 09/08/2019   Procedure: RIGHT/LEFT HEART CATH AND CORONARY ANGIOGRAPHY;  Surgeon: Antonieta Iba, MD;  Location: [facility] INVASIVE CV LAB;  Service: Cardiovascular;  Laterality: Bilateral;   TEE WITHOUT CARDIOVERSION N/A 10/07/2019   Procedure: TRANSESOPHAGEAL ECHOCARDIOGRAM (TEE);  Surgeon:  Alleen Borne, MD;  Location: [facility] OR;  Service: Open Heart Surgery;  Laterality: N/A;   TOTAL KNEE ARTHROPLASTY Left 07/14/2016   Procedure: LEFT TOTAL KNEE ARTHROPLASTY;  Surgeon: Ollen Gross, MD;  Location: [facility] ORS;  Service: Orthopedics;  Laterality: Left;   TOTAL KNEE ARTHROPLASTY Right 07/13/2017   Procedure: RIGHT TOTAL KNEE ARTHROPLASTY;  Surgeon: Ollen Gross, MD;  Location: [facility] ORS;  Service: Orthopedics;  Laterality: Right;    Current Outpatient Medications  Medication Sig Dispense Refill   ALPRAZolam (XANAX) 0.25 MG tablet Take 1 tablet (0.25 mg total) by mouth 2 (two) times daily as needed for anxiety. 60 tablet 0   apixaban (ELIQUIS) 5 MG TABS tablet Take 1 tablet (5 mg total) by mouth 2 (two) times daily. 180 tablet 3   cyanocobalamin (VITAMIN B12) 1000 MCG tablet Take 1,000 mcg by mouth daily.     ferrous sulfate 325 (65 FE) MG EC tablet Take 1 tablet (325 mg total) by mouth every other day. 45 tablet 2   glipiZIDE (GLUCOTROL) 5 MG tablet Take 5 mg by mouth daily before breakfast.     glucose blood (TRUE METRIX BLOOD GLUCOSE TEST) test strip 1 each by Other route daily. Use as instructed 100 each 12   pantoprazole (PROTONIX) 40 MG tablet TAKE 1 TABLET BY MOUTH DAILY BEFORE BREAKFAST 90 tablet 3   amiodarone (PACERONE) 200 MG tablet Take 1 tablet (200 mg total) by mouth daily. 90 tablet 3   atorvastatin (LIPITOR) 80 MG tablet Take 1 tablet (80 mg total)  by mouth daily. 90 tablet 3   cyclobenzaprine (FLEXERIL) 5 MG tablet Take 5 mg by mouth 3 (three) times daily as needed. (Patient not taking: Reported on 10/24/2022)     dapagliflozin propanediol (FARXIGA) 10 MG TABS tablet Take 1 tablet (10 mg total) by mouth daily at 2 PM. 90 tablet 3   furosemide (LASIX) 40 MG tablet Take 1 tablet (40 mg total) by mouth 2 (two) times daily. 180 tablet 3   metoprolol tartrate (LOPRESSOR) 25 MG tablet Take 1 tablet (25 mg total) by mouth 2 (two) times daily. 180 tablet 3   traZODone (DESYREL) 50  MG tablet Take 50 mg by mouth at bedtime. (Patient not taking: Reported on 10/24/2022)     No current facility-administered medications for this visit.     Allergies:   Ramipril and Pseudoephedrine-guaifenesin er   Social History:  The patient  reports that he has been smoking cigarettes. He has a 52 pack-year smoking history. He has never used smokeless tobacco. He reports current alcohol use. He reports that he does not use drugs.   Family History:   family history includes Alcohol abuse in his father; Cancer in his brother; Colon cancer in his brother; Diabetes in his father and sister; Heart disease in his father and sister; Hypertension in his father.    Review of Systems: Review of Systems  Constitutional: Negative.   HENT: Negative.    Respiratory: Negative.    Cardiovascular: Negative.   Gastrointestinal: Negative.   Musculoskeletal: Negative.   Neurological: Negative.   Psychiatric/Behavioral: Negative.    All other systems reviewed and are negative.   PHYSICAL EXAM: VS:  BP 130/70 (BP Location: Left Arm, Patient Position: Sitting, Cuff Size: Normal)   Pulse (!) 50   Ht 5\' 9"  (1.753 m)   Wt 213 lb 2 oz (96.7 kg)   SpO2 96%   BMI 31.47 kg/m  , BMI Body mass index is 31.47 kg/m. Constitutional:  oriented to person, place, and time. No distress.  HENT:  Head: Grossly normal Eyes:  no discharge. No scleral icterus.  Neck: No JVD, no carotid bruits  Cardiovascular: Regular rate and rhythm, no murmurs appreciated Pulmonary/Chest: Clear to auscultation bilaterally, no wheezes or rails Abdominal: Soft.  no distension.  no tenderness.  Musculoskeletal: Normal range of motion Neurological:  normal muscle tone. Coordination normal. No atrophy Skin: Skin warm and dry Psychiatric: normal affect, pleasant  Recent Labs: 05/01/2023: Hemoglobin 16.8; Platelet Count 190    Lipid Panel Lab Results  Component Value Date   CHOL 124 06/13/2021   HDL 39 (L) 06/13/2021    LDLCALC 63 06/13/2021   TRIG 138 06/13/2021    Wt Readings from Last 3 Encounters:  06/09/23 213 lb 2 oz (96.7 kg)  05/01/23 210 lb (95.3 kg)  10/24/22 216 lb (98 kg)     ASSESSMENT AND PLAN:  Atrial flutter/atrial fibrillation cardioversion October 2023, maintaining normal sinus rhythm continue current dose of metoprolol 25 twice daily, amiodarone 200 daily, Eliquis 5 twice daily Recommended he closely monitor pulse at home if it continues to run low 50 range, we will decrease metoprolol to tartrate down to 12.5 twice daily Will order TSH and CMP today  Coronary artery disease of native artery of native heart with stable angina pectoris (HCC) bypass surgery 2021 Currently with no symptoms of angina. No further workup at this time. Continue current medication regimen.  type 2 diabetes mellitus with circulatory disorder  A1c low 7 range Recommend continued diet  restriction, weight loss  Chronic diastolic CHF Appears euvolemic, symptoms improved with weight loss Recommend he consider decreasing Lasix down to 40 twice daily Report he was taking Lasix 80 in the morning 40 in the evening, BUN/creatinine running high  Mixed hyperlipidemia Cholesterol is at goal on the current lipid regimen. No changes to the medications were made.  Essential hypertension Blood pressure is well controlled on today's visit. No changes made to the medications.  DISORDER, TOBACCO USE Continues to smoke less than 1 pack/day We have encouraged him to continue to work on weaning his cigarettes and smoking cessation. He will continue to work on this and does not want any assistance with chantix.    Centrilobular emphysema (HCC)  COPD,  Smoking cessation recommended  Morbid obesity (HCC) We have encouraged continued exercise, careful diet management in an effort to lose weight.  Chronic renal failure In the setting of diabetes Followed by nephrology, creatinine 2.67, stable Appears  euvolemic Recommend he consider decreasing Lasix down to 40 in the morning 40 in the evening down from 80 in the a.m. 40 in the p.m.     Orders Placed This Encounter  Procedures   TSH   Comprehensive metabolic panel   EKG 12-Lead     Signed, Dossie Arbour, M.D., Ph.D. 06/09/2023  [facility] [place], [place] [phone]

## 2023-06-09 ENCOUNTER — Ambulatory Visit: Payer: Medicare Other | Attending: Cardiovascular Disease | Admitting: Cardiovascular Disease

## 2023-06-09 ENCOUNTER — Encounter: Payer: Self-pay | Admitting: Cardiovascular Disease

## 2023-06-09 VITALS — BP 130/70 | HR 50 | Ht 69.0 in | Wt 213.1 lb

## 2023-06-09 DIAGNOSIS — E1142 Type 2 diabetes mellitus with diabetic polyneuropathy: Secondary | ICD-10-CM | POA: Diagnosis not present

## 2023-06-09 DIAGNOSIS — I4891 Unspecified atrial fibrillation: Secondary | ICD-10-CM | POA: Insufficient documentation

## 2023-06-09 DIAGNOSIS — I25118 Atherosclerotic heart disease of native coronary artery with other forms of angina pectoris: Secondary | ICD-10-CM | POA: Diagnosis not present

## 2023-06-09 DIAGNOSIS — Z79899 Other long term (current) drug therapy: Secondary | ICD-10-CM | POA: Insufficient documentation

## 2023-06-09 DIAGNOSIS — I10 Essential (primary) hypertension: Secondary | ICD-10-CM | POA: Insufficient documentation

## 2023-06-09 DIAGNOSIS — Z720 Tobacco use: Secondary | ICD-10-CM | POA: Diagnosis present

## 2023-06-09 DIAGNOSIS — I483 Typical atrial flutter: Secondary | ICD-10-CM | POA: Diagnosis present

## 2023-06-09 DIAGNOSIS — E782 Mixed hyperlipidemia: Secondary | ICD-10-CM | POA: Insufficient documentation

## 2023-06-09 DIAGNOSIS — N184 Chronic kidney disease, stage 4 (severe): Secondary | ICD-10-CM | POA: Diagnosis not present

## 2023-06-09 DIAGNOSIS — I5032 Chronic diastolic (congestive) heart failure: Secondary | ICD-10-CM | POA: Insufficient documentation

## 2023-06-09 DIAGNOSIS — E1122 Type 2 diabetes mellitus with diabetic chronic kidney disease: Secondary | ICD-10-CM | POA: Insufficient documentation

## 2023-06-09 DIAGNOSIS — N183 Chronic kidney disease, stage 3 unspecified: Secondary | ICD-10-CM | POA: Diagnosis present

## 2023-06-09 MED ORDER — METOPROLOL TARTRATE 25 MG PO TABS: 25.0000 mg | ORAL_TABLET | Freq: Two times a day (BID) | ORAL | 3 refills | Status: DC

## 2023-06-09 MED ORDER — ATORVASTATIN CALCIUM 80 MG PO TABS: 80.0000 mg | ORAL_TABLET | Freq: Every day | ORAL | 3 refills | Status: DC

## 2023-06-09 MED ORDER — FUROSEMIDE 40 MG PO TABS: 40.0000 mg | ORAL_TABLET | Freq: Two times a day (BID) | ORAL | 3 refills | Status: DC

## 2023-06-09 MED ORDER — DAPAGLIFLOZIN PROPANEDIOL 10 MG PO TABS: 10.0000 mg | ORAL_TABLET | Freq: Every day | ORAL | 3 refills | Status: DC

## 2023-06-09 MED ORDER — AMIODARONE HCL 200 MG PO TABS: 200.0000 mg | ORAL_TABLET | Freq: Every day | ORAL | 3 refills | Status: DC

## 2023-06-09 NOTE — Patient Instructions (Addendum)
Medication Instructions:  Please decrease the lasix down to 40 mg twice a day  For heart rate low 50 range, Cut the metoprolol in 1/2 twice a day  If you need a refill on your cardiac medications before your next appointment, please call your pharmacy.   Lab work: TSH, CMP  Testing/Procedures: No new testing needed  Follow-Up: At [facility], you and your health needs are our priority.  As part of our continuing mission to provide you with exceptional heart care, we have created designated Provider Care Teams.  These Care Teams include your primary Cardiologist (physician) and Advanced Practice Providers (APPs -  Physician Assistants and Nurse Practitioners) who all work together to provide you with the care you need, when you need it.  You will need a follow up appointment in 12 months  Providers on your designated Care Team:   Nicolasa Ducking, NP Eula Listen, PA-C Cadence Fransico Jonatan, [place]  COVID-19 Vaccine Information can be found at: [email or web address] For questions related to vaccine distribution or appointments, please email [email or web address] or call [phone].

## 2023-07-09 DIAGNOSIS — E119 Type 2 diabetes mellitus without complications: Secondary | ICD-10-CM | POA: Diagnosis not present

## 2023-07-14 DIAGNOSIS — J449 Chronic obstructive pulmonary disease, unspecified: Secondary | ICD-10-CM | POA: Diagnosis not present

## 2023-07-14 DIAGNOSIS — E1122 Type 2 diabetes mellitus with diabetic chronic kidney disease: Secondary | ICD-10-CM | POA: Diagnosis not present

## 2023-07-14 DIAGNOSIS — I4891 Unspecified atrial fibrillation: Secondary | ICD-10-CM | POA: Diagnosis not present

## 2023-07-14 DIAGNOSIS — N529 Male erectile dysfunction, unspecified: Secondary | ICD-10-CM | POA: Diagnosis not present

## 2023-07-14 DIAGNOSIS — I129 Hypertensive chronic kidney disease with stage 1 through stage 4 chronic kidney disease, or unspecified chronic kidney disease: Secondary | ICD-10-CM | POA: Diagnosis not present

## 2023-07-14 DIAGNOSIS — Z125 Encounter for screening for malignant neoplasm of prostate: Secondary | ICD-10-CM | POA: Diagnosis not present

## 2023-07-14 DIAGNOSIS — G4700 Insomnia, unspecified: Secondary | ICD-10-CM | POA: Diagnosis not present

## 2023-07-14 DIAGNOSIS — N184 Chronic kidney disease, stage 4 (severe): Secondary | ICD-10-CM | POA: Diagnosis not present

## 2023-07-14 DIAGNOSIS — I2510 Atherosclerotic heart disease of native coronary artery without angina pectoris: Secondary | ICD-10-CM | POA: Diagnosis not present

## 2023-08-06 DIAGNOSIS — N184 Chronic kidney disease, stage 4 (severe): Secondary | ICD-10-CM | POA: Diagnosis not present

## 2023-08-06 DIAGNOSIS — E1122 Type 2 diabetes mellitus with diabetic chronic kidney disease: Secondary | ICD-10-CM | POA: Diagnosis not present

## 2023-08-06 DIAGNOSIS — N2581 Secondary hyperparathyroidism of renal origin: Secondary | ICD-10-CM | POA: Diagnosis not present

## 2023-08-06 DIAGNOSIS — I10 Essential (primary) hypertension: Secondary | ICD-10-CM | POA: Diagnosis not present

## 2023-08-11 DIAGNOSIS — N2581 Secondary hyperparathyroidism of renal origin: Secondary | ICD-10-CM | POA: Diagnosis not present

## 2023-08-11 DIAGNOSIS — I10 Essential (primary) hypertension: Secondary | ICD-10-CM | POA: Diagnosis not present

## 2023-08-11 DIAGNOSIS — E1122 Type 2 diabetes mellitus with diabetic chronic kidney disease: Secondary | ICD-10-CM | POA: Diagnosis not present

## 2023-08-11 DIAGNOSIS — N184 Chronic kidney disease, stage 4 (severe): Secondary | ICD-10-CM | POA: Diagnosis not present

## 2023-08-11 DIAGNOSIS — R600 Localized edema: Secondary | ICD-10-CM | POA: Diagnosis not present

## 2023-09-21 DIAGNOSIS — D2261 Melanocytic nevi of right upper limb, including shoulder: Secondary | ICD-10-CM | POA: Diagnosis not present

## 2023-09-21 DIAGNOSIS — L570 Actinic keratosis: Secondary | ICD-10-CM | POA: Diagnosis not present

## 2023-09-21 DIAGNOSIS — L821 Other seborrheic keratosis: Secondary | ICD-10-CM | POA: Diagnosis not present

## 2023-09-21 DIAGNOSIS — D2272 Melanocytic nevi of left lower limb, including hip: Secondary | ICD-10-CM | POA: Diagnosis not present

## 2023-09-21 DIAGNOSIS — D225 Melanocytic nevi of trunk: Secondary | ICD-10-CM | POA: Diagnosis not present

## 2023-09-21 DIAGNOSIS — D2262 Melanocytic nevi of left upper limb, including shoulder: Secondary | ICD-10-CM | POA: Diagnosis not present

## 2023-09-25 ENCOUNTER — Other Ambulatory Visit: Payer: Self-pay | Admitting: Cardiovascular Disease

## 2023-12-22 DIAGNOSIS — R600 Localized edema: Secondary | ICD-10-CM | POA: Diagnosis not present

## 2023-12-22 DIAGNOSIS — E1122 Type 2 diabetes mellitus with diabetic chronic kidney disease: Secondary | ICD-10-CM | POA: Diagnosis not present

## 2023-12-22 DIAGNOSIS — N184 Chronic kidney disease, stage 4 (severe): Secondary | ICD-10-CM | POA: Diagnosis not present

## 2023-12-24 ENCOUNTER — Other Ambulatory Visit: Payer: Self-pay | Admitting: Cardiovascular Disease

## 2023-12-24 DIAGNOSIS — E1122 Type 2 diabetes mellitus with diabetic chronic kidney disease: Secondary | ICD-10-CM | POA: Diagnosis not present

## 2023-12-24 DIAGNOSIS — I10 Essential (primary) hypertension: Secondary | ICD-10-CM | POA: Diagnosis not present

## 2023-12-24 DIAGNOSIS — N2581 Secondary hyperparathyroidism of renal origin: Secondary | ICD-10-CM | POA: Diagnosis not present

## 2023-12-24 DIAGNOSIS — R600 Localized edema: Secondary | ICD-10-CM | POA: Diagnosis not present

## 2023-12-24 DIAGNOSIS — N184 Chronic kidney disease, stage 4 (severe): Secondary | ICD-10-CM | POA: Diagnosis not present

## 2023-12-24 DIAGNOSIS — E1142 Type 2 diabetes mellitus with diabetic polyneuropathy: Secondary | ICD-10-CM

## 2024-01-21 DIAGNOSIS — E119 Type 2 diabetes mellitus without complications: Secondary | ICD-10-CM | POA: Diagnosis not present

## 2024-01-21 DIAGNOSIS — Z125 Encounter for screening for malignant neoplasm of prostate: Secondary | ICD-10-CM | POA: Diagnosis not present

## 2024-01-21 DIAGNOSIS — I10 Essential (primary) hypertension: Secondary | ICD-10-CM | POA: Diagnosis not present

## 2024-01-28 DIAGNOSIS — J449 Chronic obstructive pulmonary disease, unspecified: Secondary | ICD-10-CM | POA: Diagnosis not present

## 2024-01-28 DIAGNOSIS — F17210 Nicotine dependence, cigarettes, uncomplicated: Secondary | ICD-10-CM | POA: Diagnosis not present

## 2024-01-28 DIAGNOSIS — I509 Heart failure, unspecified: Secondary | ICD-10-CM | POA: Diagnosis not present

## 2024-01-28 DIAGNOSIS — N184 Chronic kidney disease, stage 4 (severe): Secondary | ICD-10-CM | POA: Diagnosis not present

## 2024-01-28 DIAGNOSIS — I130 Hypertensive heart and chronic kidney disease with heart failure and stage 1 through stage 4 chronic kidney disease, or unspecified chronic kidney disease: Secondary | ICD-10-CM | POA: Diagnosis not present

## 2024-01-28 DIAGNOSIS — E1122 Type 2 diabetes mellitus with diabetic chronic kidney disease: Secondary | ICD-10-CM | POA: Diagnosis not present

## 2024-01-28 DIAGNOSIS — Z1331 Encounter for screening for depression: Secondary | ICD-10-CM | POA: Diagnosis not present

## 2024-01-28 DIAGNOSIS — Z1211 Encounter for screening for malignant neoplasm of colon: Secondary | ICD-10-CM | POA: Diagnosis not present

## 2024-01-28 DIAGNOSIS — G4700 Insomnia, unspecified: Secondary | ICD-10-CM | POA: Diagnosis not present

## 2024-01-28 DIAGNOSIS — Z1212 Encounter for screening for malignant neoplasm of rectum: Secondary | ICD-10-CM | POA: Diagnosis not present

## 2024-01-28 DIAGNOSIS — Z0000 Encounter for general adult medical examination without abnormal findings: Secondary | ICD-10-CM | POA: Diagnosis not present

## 2024-01-28 DIAGNOSIS — I2510 Atherosclerotic heart disease of native coronary artery without angina pectoris: Secondary | ICD-10-CM | POA: Diagnosis not present

## 2024-02-03 ENCOUNTER — Other Ambulatory Visit: Payer: Self-pay

## 2024-02-03 DIAGNOSIS — I4891 Unspecified atrial fibrillation: Secondary | ICD-10-CM

## 2024-02-03 NOTE — Telephone Encounter (Addendum)
 Prescription refill request for Eliquis  received. Indication: Afib  Last office visit: 06/09/23 [place])  Scr: 2.9 (01/21/24)  Age: 73 Weight: 96.7kg  Appropriate dose.   Refill request received via fax from  [facility] [employer].  [address] [place], [place]  Phone: [phone] Fax: [phone]  Anticoagulation clinic unable to send prescription refills to [facility] pharmacy. Pt will need a printed prescription signed by provider and faxed to pharmacy. Will forward to MD for signed prescription.

## 2024-02-04 ENCOUNTER — Encounter: Payer: Self-pay | Admitting: Cardiovascular Disease

## 2024-02-04 MED ORDER — APIXABAN 5 MG PO TABS: 5.0000 mg | ORAL_TABLET | Freq: Two times a day (BID) | ORAL | 3 refills | Status: DC

## 2024-02-04 NOTE — Telephone Encounter (Signed)
 Called patient and left message for call back.

## 2024-02-04 NOTE — Telephone Encounter (Signed)
 error

## 2024-02-08 ENCOUNTER — Telehealth: Payer: Self-pay | Admitting: Cardiovascular Disease

## 2024-02-08 NOTE — Telephone Encounter (Signed)
*  STAT* If patient is at the pharmacy, call can be transferred to refill team.   1. Which medications need to be refilled? (please list name of each medication and dose if known)   apixaban  (ELIQUIS ) 5 MG TABS tablet    2. Which pharmacy/location (including street and city if local pharmacy) is medication to be sent to?[facility]   3. Do they need a 30 day or 90 day supply? 90 Pt needs to be sent asap because it takes 3 weeks to get medication. I question the [facility] but pt states we have been sending it for over a year.

## 2024-02-08 NOTE — Telephone Encounter (Signed)
 Called pt and informed him Eliquis  Rx had been faxed to the [place] pharmacy he requested and confirmed fax number was correct,  This was done on 02/04/24 by Holley Dawn RN.  He appreciated call back.

## 2024-02-18 DIAGNOSIS — E119 Type 2 diabetes mellitus without complications: Secondary | ICD-10-CM | POA: Diagnosis not present

## 2024-02-18 DIAGNOSIS — M160 Bilateral primary osteoarthritis of hip: Secondary | ICD-10-CM | POA: Diagnosis not present

## 2024-02-18 DIAGNOSIS — M5416 Radiculopathy, lumbar region: Secondary | ICD-10-CM | POA: Diagnosis not present

## 2024-02-26 DIAGNOSIS — M25552 Pain in left hip: Secondary | ICD-10-CM | POA: Diagnosis not present

## 2024-02-26 DIAGNOSIS — G8929 Other chronic pain: Secondary | ICD-10-CM | POA: Diagnosis not present

## 2024-02-26 DIAGNOSIS — M545 Low back pain, unspecified: Secondary | ICD-10-CM | POA: Diagnosis not present

## 2024-02-26 DIAGNOSIS — M25551 Pain in right hip: Secondary | ICD-10-CM | POA: Diagnosis not present

## 2024-03-10 DIAGNOSIS — G8929 Other chronic pain: Secondary | ICD-10-CM | POA: Diagnosis not present

## 2024-03-10 DIAGNOSIS — M545 Low back pain, unspecified: Secondary | ICD-10-CM | POA: Diagnosis not present

## 2024-03-10 DIAGNOSIS — M25551 Pain in right hip: Secondary | ICD-10-CM | POA: Diagnosis not present

## 2024-03-10 DIAGNOSIS — M25552 Pain in left hip: Secondary | ICD-10-CM | POA: Diagnosis not present

## 2024-03-17 DIAGNOSIS — M25552 Pain in left hip: Secondary | ICD-10-CM | POA: Diagnosis not present

## 2024-03-17 DIAGNOSIS — M545 Low back pain, unspecified: Secondary | ICD-10-CM | POA: Diagnosis not present

## 2024-03-17 DIAGNOSIS — M25551 Pain in right hip: Secondary | ICD-10-CM | POA: Diagnosis not present

## 2024-03-17 DIAGNOSIS — G8929 Other chronic pain: Secondary | ICD-10-CM | POA: Diagnosis not present

## 2024-03-29 DIAGNOSIS — M545 Low back pain, unspecified: Secondary | ICD-10-CM | POA: Diagnosis not present

## 2024-03-29 DIAGNOSIS — M25551 Pain in right hip: Secondary | ICD-10-CM | POA: Diagnosis not present

## 2024-03-29 DIAGNOSIS — G8929 Other chronic pain: Secondary | ICD-10-CM | POA: Diagnosis not present

## 2024-03-29 DIAGNOSIS — M25552 Pain in left hip: Secondary | ICD-10-CM | POA: Diagnosis not present

## 2024-04-05 DIAGNOSIS — M51372 Other intervertebral disc degeneration, lumbosacral region with discogenic back pain and lower extremity pain: Secondary | ICD-10-CM | POA: Diagnosis not present

## 2024-04-05 DIAGNOSIS — M160 Bilateral primary osteoarthritis of hip: Secondary | ICD-10-CM | POA: Diagnosis not present

## 2024-04-05 DIAGNOSIS — M5416 Radiculopathy, lumbar region: Secondary | ICD-10-CM | POA: Diagnosis not present

## 2024-04-07 ENCOUNTER — Ambulatory Visit: Admitting: Medical

## 2024-04-07 NOTE — Progress Notes (Deleted)
 Cardiology Office Note   Date:  04/07/2024  ID:  Joshua Roy, DOB September 19, 1950, MRN [id number] PCP: Valora Agent, MD  [facility] Providers Cardiologist:  Evalene Lunger, MD { Click to update primary MD,subspecialty MD or APP then REFRESH:1}    History of Present Illness Joshua Roy is a 73 y.o. male with a hx of CAD status post CABG in 09/2019 with postoperative A-fib, HFpEF, DM 2, carotid artery disease status post CEA in 09/2019, CKD stage IV, HTN, HLD, obesity, and COPD with ongoing tobacco use who is being seen for 1 month follow-up.    Mr. Kleinsasser was admitted to the hospital in 2021 with presyncope followed by brief episode of syncope, though left AMA.  He was subsequently found to be volume overloaded with EF at that time showing preserved LV systolic function with diastolic dysfunction and no significant valvular disease.  LHC showed severe multivessel CAD.  He underwent CABG x4 in 09/2019.  Preoperative work-up showed severe right ICA stenosis was treated with CEA at the time of his CABG.  Postoperative course was noted for A-fib which was treated with amiodarone  without recurrence.  He had a subsequent admission in 11/2019 with CHF exacerbation and multiorgan failure requiring significant supplemental oxygen  at 2 L throughout his course.  Note indicates the patient was in denial as to his numerous medical issues.  He has not required ischemic evaluation since his CABG.  He was admitted to the hospital in 07/2021 with acute respiratory failure secondary to COVID-pneumonia and HFpEF.  Echo during that admission demonstrated an EF of 55 to 60%, indeterminate LV diastolic function parameters, normal RV systolic function and ventricular cavity size, mild biatrial enlargement, and aortic valve sclerosis without evidence of stenosis.  He was seen in 11/2021 with an increase in lower extremity edema that improved with titration of furosemide  and addition of Farxiga .   He  was admitted to [facility] on 04/01/2022 with a 2-week history of increased shortness of breath and lower extremity swelling.  He was without symptoms of frank chest pain, palpitations, presyncope, or syncope.  He had gained approximately 20 pounds over that 2-week timeframe despite escalation of furosemide .  He reported adherence to his medications.  Upon admission, he was noted to be hypoxic with oxygen  saturation of 78%, potassium 5.2 trending to 6.0, serum creatinine 3.94 trending to 5.45, BNP 267, and high-sensitivity troponin 22x2.  EKG showed new onset atrial flutter with variable AV block. He was managed with Lasix  drip with the assistance of nephrology with improvement in serum creatinine. Echo showed an EF of 60 to 65%, severe LVH, mildly reduced RV systolic function with moderately enlarged ventricular cavity size, severely elevated PASP estimated at 78.4 mmHg, upper normal left atrial size, mildly dilated right atrium, trivial mitral regurgitation, mild to moderate tricuspid regurgitation, aortic valve sclerosis without evidence of stenosis, borderline dilatation of the aortic root, and an estimated right atrial pressure 15 mmHg. He was started on metoprolol  25mg BID and Eliquis  5mg  BID with plan for an outpatient cardioversion after 4 weeks. Scr on discharge was 3.25. He was discharged on lasix  80mg  in the am and 40 in the pm.    He was seen in the office 04/08/22 and had not started his Eliquis . He was rate controlled. Plan to start Eliquis  and re-evaluate for cardioversion at follow-up.   Patient was seen 05/15/2022 and was in rate controlled A-fib.  Reported weight loss of 30 to 40 pounds with diet and exercise.  Patient was  set up for cardioversion.  Patient underwent successful cardioversion on 05/22/2022.   He was seen 06/10/22 and was back in afib and started on amiodarone . The patient was referred to EP, and pulmonology for sleep study.  Patient was seen in follow-up and was in normal sinus  rhythm.  Patient was last seen 06/17/2023 and remained in normal sinus rhythm on metoprolol , amiodarone , and Eliquis .  He was taking Lasix  80 mg in the morning and 40 mg in the evening.  Was recommended he take 40 mg twice daily.  Today,  ROS: ***  Studies Reviewed      *** Risk Assessment/Calculations {Does this patient have ATRIAL FIBRILLATION?:[phone]} No BP recorded.  {Refresh Note OR Click here to enter BP  :1}***       Physical Exam VS:  There were no vitals taken for this visit.       Wt Readings from Last 3 Encounters:  06/09/23 213 lb 2 oz (96.7 kg)  05/01/23 210 lb (95.3 kg)  10/24/22 216 lb (98 kg)    GEN: Well nourished, well developed in no acute distress NECK: No JVD; No carotid bruits CARDIAC: ***RRR, no murmurs, rubs, gallops RESPIRATORY:  Clear to auscultation without rales, wheezing or rhonchi  ABDOMEN: Soft, non-tender, non-distended EXTREMITIES:  No edema; No deformity   ASSESSMENT AND PLAN ***    {Are you ordering a CV Procedure (e.g. stress test, cath, DCCV, TEE, etc)?   Press F2        :[id number]}  Dispo: ***  Signed, Mekhai Venuto VEAR Fishman, PA-C

## 2024-04-12 DIAGNOSIS — M5416 Radiculopathy, lumbar region: Secondary | ICD-10-CM | POA: Diagnosis not present

## 2024-04-12 DIAGNOSIS — M47816 Spondylosis without myelopathy or radiculopathy, lumbar region: Secondary | ICD-10-CM | POA: Diagnosis not present

## 2024-04-12 DIAGNOSIS — M4807 Spinal stenosis, lumbosacral region: Secondary | ICD-10-CM | POA: Diagnosis not present

## 2024-04-12 DIAGNOSIS — M48061 Spinal stenosis, lumbar region without neurogenic claudication: Secondary | ICD-10-CM | POA: Diagnosis not present

## 2024-04-12 DIAGNOSIS — G039 Meningitis, unspecified: Secondary | ICD-10-CM | POA: Diagnosis not present

## 2024-04-12 DIAGNOSIS — M51379 Other intervertebral disc degeneration, lumbosacral region without mention of lumbar back pain or lower extremity pain: Secondary | ICD-10-CM | POA: Diagnosis not present

## 2024-04-12 DIAGNOSIS — M51369 Other intervertebral disc degeneration, lumbar region without mention of lumbar back pain or lower extremity pain: Secondary | ICD-10-CM | POA: Diagnosis not present

## 2024-04-15 DIAGNOSIS — M5416 Radiculopathy, lumbar region: Secondary | ICD-10-CM | POA: Diagnosis not present

## 2024-04-20 ENCOUNTER — Encounter: Payer: Self-pay | Admitting: Nurse Practitioner

## 2024-04-20 ENCOUNTER — Ambulatory Visit: Attending: Nurse Practitioner | Admitting: Nurse Practitioner

## 2024-04-20 VITALS — BP 130/80 | HR 63 | Ht 69.0 in | Wt 210.2 lb

## 2024-04-20 DIAGNOSIS — I4891 Unspecified atrial fibrillation: Secondary | ICD-10-CM | POA: Diagnosis not present

## 2024-04-20 DIAGNOSIS — E1142 Type 2 diabetes mellitus with diabetic polyneuropathy: Secondary | ICD-10-CM | POA: Diagnosis not present

## 2024-04-20 DIAGNOSIS — E782 Mixed hyperlipidemia: Secondary | ICD-10-CM | POA: Insufficient documentation

## 2024-04-20 DIAGNOSIS — I2510 Atherosclerotic heart disease of native coronary artery without angina pectoris: Secondary | ICD-10-CM | POA: Diagnosis present

## 2024-04-20 DIAGNOSIS — I480 Paroxysmal atrial fibrillation: Secondary | ICD-10-CM | POA: Diagnosis not present

## 2024-04-20 DIAGNOSIS — N184 Chronic kidney disease, stage 4 (severe): Secondary | ICD-10-CM | POA: Insufficient documentation

## 2024-04-20 DIAGNOSIS — I10 Essential (primary) hypertension: Secondary | ICD-10-CM | POA: Insufficient documentation

## 2024-04-20 DIAGNOSIS — Z720 Tobacco use: Secondary | ICD-10-CM | POA: Diagnosis not present

## 2024-04-20 DIAGNOSIS — I5032 Chronic diastolic (congestive) heart failure: Secondary | ICD-10-CM | POA: Insufficient documentation

## 2024-04-20 MED ORDER — NITROGLYCERIN 0.4 MG SL SUBL: 0.4000 mg | SUBLINGUAL_TABLET | SUBLINGUAL | 3 refills | Status: AC | PRN

## 2024-04-20 NOTE — Patient Instructions (Signed)
 Medication Instructions:   Your physician recommends that you continue on your current medications as directed. Please refer to the Current Medication list given to you today.   Nitroglycerin  0.4 mg Sublingual (Under tongue) every 5 minutes x 3 for chest pain.  If chest pain doesn't subside, call 911. *If you need a refill on your cardiac medications before your next appointment, please call your pharmacy*  Lab Work:  Your provider would like for you to have following labs drawn today TSH.    If you have labs (blood work) drawn today and your tests are completely normal, you will receive your results only by: MyChart Message (if you have MyChart) OR A paper copy in the mail If you have any lab test that is abnormal or we need to change your treatment, we will call you to review the results.  Testing/Procedures:  Your physician has requested that you have a carotid duplex. This test is an ultrasound of the carotid arteries in your neck. It looks at blood flow through these arteries that supply the brain with blood.   Allow one hour for this exam.  There are no restrictions or special instructions.  This will take place at [address] [facility] [address]) [address], [place] [address]  Please note: We ask at that you not bring children with you during ultrasound (echo/ vascular) testing. Due to room size and safety concerns, children are not allowed in the ultrasound rooms during exams. Our front office staff cannot provide observation of children in our lobby area while testing is being conducted. An adult accompanying a patient to their appointment will only be allowed in the ultrasound room at the discretion of the ultrasound technician under special circumstances. We apologize for any inconvenience.   Follow-Up:  At [facility], you and your health needs are our priority.  As part of our continuing mission to provide you with exceptional heart care, our providers are all  part of one team.  This team includes your primary Cardiologist (physician) and Advanced Practice Providers or APPs (Physician Assistants and Nurse Practitioners) who all work together to provide you with the care you need, when you need it.  Your next appointment:    3 - 4 month(s)  with Timothy Gollan, MD   We recommend signing up for the patient portal called MyChart.  Sign up information is provided on this After Visit Summary.  MyChart is used to connect with patients for Virtual Visits (Telemedicine).  Patients are able to view lab/test results, encounter notes, upcoming appointments, etc.  Non-urgent messages can be sent to your provider as well.   To learn more about what you can do with MyChart, go to [email or web address].

## 2024-04-20 NOTE — Progress Notes (Signed)
 Office Visit    Patient Name: Joshua Roy Date of Encounter: 04/20/2024  Primary Care Provider:  Valora Agent, MD Primary Cardiologist:  Evalene Lunger, MD    Chief Complaint    73 y.o. male with with a history of CAD status post four-vessel coronary artery bypass grafting in March 2021, carotid disease status post right CEA (09/2019), paroxysmal atrial fibrillation, hypertension, hyperlipidemia, type 2 diabetes mellitus, tobacco abuse, COPD, obesity, stage IV chronic kidney disease, HFpEF, and PSVT, presents for follow-up related to CAD and heart failure.   Past Medical History   Subjective   Past Medical History:  Diagnosis Date   (HFpEF) heart failure with preserved ejection fraction (HCC)    a. 07/2019 Echo: EF 60-65%. Mod LVH. Gr1 DD. No rwma. Nl RV size/fxn. Mildly dil LA; b. 11/2019 Echo: EF 55-60%, no rwma, mild LVH. Nl RV size/fxn. Mod dil LA; c. 03/2022 Echo: EF 60-65%, sev LVH, mildly reduced RV fxn, RVSP 78.66mmHg, triv MR, mild-mod TR, Ao root 38mm.   ACE-inhibitor cough    Anemia    Anxiety    Arrhythmia    atrial flutter   Bronchitis 06/27/2016   Carotid arterial disease    a. 09/2019 Carotid U/S: RICA 80-99%, LICA 1-39%; b. 09/2019 R CEA.   CKD (chronic kidney disease), stage IV (HCC)    COPD (chronic obstructive pulmonary disease) (HCC)    cath, mild inf. hypokinesis EF 49%, 100% ROA   Coronary artery disease    a. 2001 Cath: LM 10, LAD 60p/m, D1 30, LCX 20p, RCA 167m/d-->Med Rx; b. 05/2017 Ex Mv: Ex time 5:46, antlat twi @ rest, fixed infarct w/ mild peri-infarct ischemia. EF 38%; c. 08/2019 Cath: LM 40ost, 70d, LAD 6m, LCX 36m, RCA 80-90p, 174m; d. 09/2019 CABG x4: LIMA->LAD, VG->RI, VG->OM, VG->RPDA.   Diabetes mellitus type II    Hyperlipidemia    Hypertension    MI (myocardial infarction) (HCC) age 19   OA (osteoarthritis)    knee OA, injected 2012 by ortho   PAF (paroxysmal atrial fibrillation) (HCC)    a. 09/2019 post-op CABG-->Amio; b. 04/2022  s/p DCCV (150J).   Pneumonia    PSVT (paroxysmal supraventricular tachycardia)    a. 08/2019 Zio: 189 brief runs of SVT (fastest 174 x 6 beats, longest 15 beats @ 107).   Pulmonary hypertension (HCC)    a. 03/2022 Echo: RVSP 78.31mmHg.   Tobacco abuse    Past Surgical History:  Procedure Laterality Date   CARDIAC CATHETERIZATION  05/06/2000   @ [facility]   CARDIOVERSION N/A 05/22/2022   Procedure: CARDIOVERSION;  Surgeon: Lunger Evalene PARAS, MD;  Location: [facility] ORS;  Service: Cardiovascular;  Laterality: N/A;   COLONOSCOPY WITH PROPOFOL  N/A 04/20/2018   Procedure: COLONOSCOPY WITH PROPOFOL ;  Surgeon: Jinny Carmine, MD;  Location: [facility] ENDOSCOPY;  Service: Endoscopy;  Laterality: N/A;   COLONOSCOPY WITH PROPOFOL  N/A 09/02/2022   Procedure: COLONOSCOPY WITH PROPOFOL ;  Surgeon: Jinny Carmine, MD;  Location: [facility] ENDOSCOPY;  Service: Endoscopy;  Laterality: N/A;   CORONARY ARTERY BYPASS GRAFT N/A 10/07/2019   Procedure: CORONARY ARTERY BYPASS GRAFTING (CABG) using LIMA to LAD; Endoscopic harvesting of left greater saphenous vein: SVG to RAMUS; SVG to OM1; SVG to PDA.;  Surgeon: Lucas Dorise POUR, MD;  Location: [facility] OR;  Service: Open Heart Surgery;  Laterality: N/A;   ENDARTERECTOMY Right 10/07/2019   Procedure: ENDARTERECTOMY CAROTID;  Surgeon: Oris Krystal FALCON, MD;  Location: [facility] OR;  Service: Vascular;  Laterality: Right;   ENDOVEIN HARVEST  OF GREATER SAPHENOUS VEIN Left 10/07/2019   Procedure: Jethro Rubins Of Greater Saphenous Vein;  Surgeon: Lucas Dorise POUR, MD;  Location: [facility] OR;  Service: Open Heart Surgery;  Laterality: Left;   ESOPHAGOGASTRODUODENOSCOPY N/A 09/02/2022   Procedure: ESOPHAGOGASTRODUODENOSCOPY (EGD);  Surgeon: Jinny Carmine, MD;  Location: [facility] ENDOSCOPY;  Service: Endoscopy;  Laterality: N/A;   ESOPHAGOGASTRODUODENOSCOPY ENDOSCOPY     KNEE ARTHROSCOPY  07/25/04   left   KNEE SURGERY  1969   right, open surgery- repair   RIGHT/LEFT HEART CATH AND CORONARY ANGIOGRAPHY Bilateral 09/08/2019    Procedure: RIGHT/LEFT HEART CATH AND CORONARY ANGIOGRAPHY;  Surgeon: Perla Evalene PARAS, MD;  Location: [facility] INVASIVE CV LAB;  Service: Cardiovascular;  Laterality: Bilateral;   TEE WITHOUT CARDIOVERSION N/A 10/07/2019   Procedure: TRANSESOPHAGEAL ECHOCARDIOGRAM (TEE);  Surgeon: Lucas Dorise POUR, MD;  Location: [facility] OR;  Service: Open Heart Surgery;  Laterality: N/A;   TOTAL KNEE ARTHROPLASTY Left 07/14/2016   Procedure: LEFT TOTAL KNEE ARTHROPLASTY;  Surgeon: Dempsey Moan, MD;  Location: [facility] ORS;  Service: Orthopedics;  Laterality: Left;   TOTAL KNEE ARTHROPLASTY Right 07/13/2017   Procedure: RIGHT TOTAL KNEE ARTHROPLASTY;  Surgeon: Moan Dempsey, MD;  Location: [facility] ORS;  Service: Orthopedics;  Laterality: Right;    Allergies  Allergies  Allergen Reactions   Ramipril Cough and Other (See Comments)    REACTION: Cough  ramipril   Pseudoephedrine-Guaifenesin Er Other (See Comments) and Hypertension    Elevated BP, insomnia Elevated BP, insomnia       History of Present Illness      73 y.o. y/o male with a history of CAD status post four-vessel CABG, carotid disease status post right CEA, postoperative atrial fibrillation, hypertension, hyperlipidemia, type 2 diabetes mellitus, tobacco abuse, COPD, obesity, CKD IV, HFpEF, and PSVT. He initially underwent catheterization in 2001, revealing an occluded mid/distal RCA, and was medically managed. In January 2021, he had a prolonged episode of presyncope followed by a brief episode of syncope for which he was seen in the [facility] ED, but left AMA. He subsequently noted progressive weight gain, dyspnea, lower extremity and scrotal edema, and orthopnea. Echo showed normal LV function with grade 1 diastolic dysfunction and no significant valvular disease. He underwent diagnostic catheterization revealing severe, multivessel CAD and underwent CABG x4 in March 2021. Preop carotid Dopplers revealed severe right internal carotid artery stenosis and this was treated  with right carotid endarterectomy at the time of his CABG. Postoperative course was minimally complicated by A. fib, which was successfully managed with amiodarone .  He required readmission in the setting of volume overload and hypoxia in 11/2019. Echo during hospitalization continued showed normal LV function-55-60%, with mild LVH.   In January 2023, he was admitted with respiratory failure and COVID-pneumonia.  Echo showed EF of 55-60%.  He was readmitted in September 2023 with a 2-week history of dyspnea and edema.  ECG showed atrial flutter with variable conduction.  Echo showed an EF of 60 to 65% with severe LVH, RVSP of 78.4 mmHg, mild-moderate TR, aortic valve sclerosis, and borderline dilatation of the aortic root.  He was placed on beta-blocker and apixaban  therapy and subsequently underwent outpatient cardioversion in October 2023.   Patient was last in cardiology clinic in November 2024, at which time he was maintaining sinus rhythm on amiodarone  and metoprolol , but was bradycardic and therefore, metoprolol  dose was reduced to 12.5 mg twice a day.  Over the past year, for the most part, he has done well.  About 2 weeks  ago, he started noticing weight gain and within the span of a few days, he was up to 8 pounds and he was feeling somewhat weak with easy fatigability and increase in abdominal girth.  He was not experiencing lower extremity swelling.  After about 2 to 3 days, his weight came back down to prior baseline and symptoms resolved.  At no point did he experience chest pain.  He has had no recurrence of weight gain, dyspnea, fatigue and also denies edema, PND, orthopnea, dizziness, syncope, or early satiety.  He is unsure what might have sparked his weight gain.  He admits to eating a fair amount of processed food and deli meats but does not think that his intake had increased at all prior to or during weight gain.  He notes compliance with his medications including diuretic therapy.  He is  currently on prednisone  in the setting of back pain but this was started after his weight gain had already resolved. Objective   Home Medications    Current Outpatient Medications  Medication Sig Dispense Refill   ALPRAZolam  (XANAX ) 0.25 MG tablet Take 1 tablet (0.25 mg total) by mouth 2 (two) times daily as needed for anxiety. 60 tablet 0   amiodarone  (PACERONE ) 200 MG tablet TAKE 1 TABLET BY MOUTH EVERY DAY 90 tablet 3   apixaban  (ELIQUIS ) 5 MG TABS tablet Take 1 tablet (5 mg total) by mouth 2 (two) times daily. 180 tablet 3   atorvastatin  (LIPITOR ) 80 MG tablet TAKE 1 TABLET BY MOUTH EVERY DAY 90 tablet 3   cyanocobalamin  (VITAMIN B12) 1000 MCG tablet Take 1,000 mcg by mouth daily.     dapagliflozin  propanediol (FARXIGA ) 10 MG TABS tablet Take 1 tablet (10 mg total) by mouth daily at 2 PM. 90 tablet 3   ferrous sulfate  325 (65 FE) MG EC tablet Take 1 tablet (325 mg total) by mouth every other day. 45 tablet 2   furosemide  (LASIX ) 40 MG tablet TAKE 2 TABLETS IN THE MORNING AND 1 TABLET AT NIGHT THAT IS 80 MG IN THE MORNING AND 40 MG AT NIGHT 270 tablet 1   glipiZIDE  (GLUCOTROL ) 5 MG tablet Take 5 mg by mouth daily before breakfast.     glucose blood (TRUE METRIX BLOOD GLUCOSE TEST) test strip 1 each by Other route daily. Use as instructed 100 each 12   metoprolol  tartrate (LOPRESSOR ) 25 MG tablet TAKE 1 TABLET BY MOUTH TWICE A DAY 180 tablet 1   nitroGLYCERIN  (NITROSTAT ) 0.4 MG SL tablet Place 1 tablet (0.4 mg total) under the tongue every 5 (five) minutes as needed for chest pain. 25 tablet 3   pantoprazole  (PROTONIX ) 40 MG tablet TAKE 1 TABLET BY MOUTH DAILY BEFORE BREAKFAST 90 tablet 3   predniSONE  (STERAPRED UNI-PAK 48 TAB) 10 MG (48) TBPK tablet Take by mouth daily.     cyclobenzaprine (FLEXERIL) 5 MG tablet Take 5 mg by mouth 3 (three) times daily as needed. (Patient not taking: Reported on 04/20/2024)     traZODone  (DESYREL ) 50 MG tablet Take 50 mg by mouth at bedtime. (Patient not  taking: Reported on 04/20/2024)     No current facility-administered medications for this visit.     Physical Exam    VS:  BP 130/80 (BP Location: Left Arm, Patient Position: Sitting, Cuff Size: Normal)   Pulse 63   Ht 5' 9 (1.753 m)   Wt 210 lb 4 oz (95.4 kg)   SpO2 94%   BMI 31.05 kg/m  , BMI  Body mass index is 31.05 kg/m.          GEN: Well nourished, well developed, in no acute distress. HEENT: normal. Neck: Supple, no JVD, carotid bruits, or masses. Cardiac: RRR, no murmurs, rubs, or gallops. No clubbing, cyanosis, edema.  Radials 2+/PT 2+ and equal bilaterally.  Respiratory:  Respirations regular and unlabored, clear to auscultation bilaterally. GI: Soft, nontender, nondistended, BS + x 4. MS: no deformity or atrophy. Skin: warm and dry, no rash. Neuro:  Strength and sensation are intact. Psych: Normal affect.  Accessory Clinical Findings    ECG personally reviewed by me today - EKG Interpretation Date/Time:  Wednesday April 20 2024 14:05:21 EDT Ventricular Rate:  63 PR Interval:  226 QRS Duration:  114 QT Interval:  462 QTC Calculation: 472 R Axis:   91  Text Interpretation: Sinus rhythm with 1st degree A-V block Rightward axis Minimal voltage criteria for LVH, may be normal variant ( Cornell product ) Non-specific intra-ventricular conduction delay Septal infarct (cited on or before 09-Jun-2023) Confirmed by Vivienne Bruckner [phone]) on 04/20/2024 2:12:51 PM  - no acute changes.   Lab Results  Component Value Date   TSH 2.088 04/01/2022  Labs dated January 21, 2024 from Care Everywhere:  WBC [facility] Blood Cell Count) 4.1 - 10.2 10^3/uL 8.0  RBC (Red Blood Cell Count) 4.69 - 6.13 10^6/uL 6.25 High   Hemoglobin 14.1 - 18.1 gm/dL 81.8  Hematocrit 59.9 - 52.0 % 53.9 High   MCV (Mean Corpuscular Volume) 80.0 - 100.0 fl 86.2  MCH (Mean Corpuscular Hemoglobin) 27.0 - 31.2 pg 29.0  MCHC (Mean Corpuscular Hemoglobin Concentration) 32.0 - 36.0 gm/dL 66.3   Platelet Count 849 - 450 10^3/uL 170  RDW-CV (Red Cell Distribution Width) 11.6 - 14.8 % 15.0 High   MPV (Mean Platelet Volume) 9.4 - 12.4 fl 9.6  Neutrophils 1.50 - 7.80 10^3/uL 4.90  Lymphocytes 1.00 - 3.60 10^3/uL 2.30  Mixed Count 0.10 - 0.90 10^3/uL 0.80  Neutrophil % 32.0 - 70.0 % 60.6  Lymphocyte % 10.0 - 50.0 % 29.0  Mixed % 3.0 - 14.4 % 10.4   Component Ref Range & Units 3 mo ago Comments  Glucose 70 - 110 mg/dL 872 High    Sodium 863 - 145 mmol/L 139   Potassium 3.6 - 5.1 mmol/L 3.8   Chloride 97 - 109 mmol/L 99   Carbon Dioxide (CO2) 22.0 - 32.0 mmol/L 32.4 High    Urea  Nitrogen (BUN) 7 - 25 mg/dL 41 High    Creatinine 0.7 - 1.3 mg/dL 2.9 High    Glomerular Filtration Rate (eGFR) >60 mL/min/1.73sq m 22 Low  CKD-EPI (2021) does not include patient's race in the calculation of eGFR.  Monitoring changes of plasma creatinine and eGFR over time is useful for monitoring kidney function.  Interpretive Ranges for eGFR (CKD-EPI 2021):  eGFR:       >60 mL/min/1.73 sq. m - Normal eGFR:       30-59 mL/min/1.73 sq. m - Moderately Decreased eGFR:       15-29 mL/min/1.73 sq. m  - Severely Decreased eGFR:       < 15 mL/min/1.73 sq. m  - Kidney Failure   Note: These eGFR calculations do not apply in acute situations when eGFR is changing rapidly or patients on dialysis.  Calcium  8.7 - 10.3 mg/dL 9.4   AST 8 - 39 U/L 34   ALT 6 - 57 U/L 45   Alk Phos (alkaline Phosphatase) 34 - 104 U/L 75  Albumin  3.5 - 4.8 g/dL 4.4   Bilirubin, Total 0.3 - 1.2 mg/dL 0.9   Protein, Total 6.1 - 7.9 g/dL 6.9   A/G Ratio 1.0 - 5.0 gm/dL 1.8    Hemoglobin J8r 7.1  Total cholesterol 136, triglycerides 141, HDL 34.4, LDL 73    Assessment & Plan    1.  Chronic heart failure with preserved ejection fraction: Patient with prior history of HFpEF with a EF of 60-65% by echo in September 2023.  He is euvolemic on examination today but notes that about 2 weeks ago he had 8 pound  weight gain with increased abdominal girth, dyspnea, and fatigue.  He is unaware of any dietary indiscretions or missing any medications.  Symptoms resolved within a few days and have not recurred.  He continues to weigh himself daily.  Offered echocardiogram however at this time, he wishes to defer.  We discussed that if he has any recurrence of heart failure symptoms/volume overload, he should have a low threshold to contact us .  Heart rate and blood pressure stable today and he remains on Lasix  80 mg in the morning and 40 mg in the evening as well as beta-blocker therapy.  2.  Coronary artery disease: Status post CABG times 29 September 2019.  He has been doing well without chest pain.  He has been going to the gym and tolerating activity without limitations but due to back pain for which he is currently on prednisone  (started following resolution of weight gain discussed above), and has not been back to the gym but does plan to begin exercising again once back pain resolves.  Continue beta-blocker and statin therapy.  No aspirin  in the setting of Eliquis .  3.  Paroxysmal atrial fibrillation: Initially diagnosed following his CABG with more recent cardioversion October 2023.  Maintaining sinus rhythm on amiodarone  and beta-blocker.  He is anticoagulated with Eliquis .  He had labs in June but has not had a TSH in several years.  Following up today.  4.  Primary hypertension: Stable on beta-blocker and diuretic therapy.  5.  Hyperlipidemia: On atorvastatin  with LDL above goal and June 2025.  Strongly recommended lifestyle modifications as he is currently eating a lot of processed food and daily meat.  If no improvement, low threshold to add Zetia .  6.  Type 2 diabetes mellitus: Followed by primary care with A1c of 7.1 in June.  He is on Farxiga  therapy.  7.  Tobacco abuse: Still smoking up to a pack a day.  He is not currently contemplating quitting.  Complete cessation advised.  8.  Stage IV chronic  kidney disease: Creatinine was 2.9 in June.  Followed by nephrology  9.  Carotid arterial disease: Status post prior right carotid endarterectomy at the time of his bypass surgery in 2021.  No imaging since then.  Will arrange for carotid ultrasound.  He remains on statin therapy.  No aspirin  in the setting of chronic Eliquis .  10.  PSVT: Quiescent on beta-blocker therapy.  11.  Disposition: Follow-up TSH and carotid ultrasound.  Follow-up in clinic in 3 to 4 months or sooner if necessary.  Lonni Meager, NP 04/20/2024, 6:22 PM

## 2024-04-21 ENCOUNTER — Ambulatory Visit: Payer: Self-pay | Admitting: Nurse Practitioner

## 2024-04-21 DIAGNOSIS — I480 Paroxysmal atrial fibrillation: Secondary | ICD-10-CM

## 2024-04-21 DIAGNOSIS — Z79899 Other long term (current) drug therapy: Secondary | ICD-10-CM

## 2024-04-21 LAB — TSH: TSH: 0.378 u[IU]/mL — ABNORMAL LOW (ref 0.450–4.500)

## 2024-04-27 ENCOUNTER — Telehealth: Payer: Self-pay | Admitting: Cardiovascular Disease

## 2024-04-27 DIAGNOSIS — N184 Chronic kidney disease, stage 4 (severe): Secondary | ICD-10-CM | POA: Diagnosis not present

## 2024-04-27 DIAGNOSIS — E1122 Type 2 diabetes mellitus with diabetic chronic kidney disease: Secondary | ICD-10-CM | POA: Diagnosis not present

## 2024-04-27 DIAGNOSIS — I10 Essential (primary) hypertension: Secondary | ICD-10-CM | POA: Diagnosis not present

## 2024-04-27 NOTE — Telephone Encounter (Signed)
 Patient canceled his Carotid test and stated he wants to speak with Dr. Gollan first.

## 2024-04-27 NOTE — Telephone Encounter (Signed)
 Left message asking for additional details for the cancellation

## 2024-04-27 NOTE — Telephone Encounter (Signed)
 Left message to call back.

## 2024-05-02 DIAGNOSIS — Z7984 Long term (current) use of oral hypoglycemic drugs: Secondary | ICD-10-CM | POA: Diagnosis not present

## 2024-05-02 DIAGNOSIS — E1122 Type 2 diabetes mellitus with diabetic chronic kidney disease: Secondary | ICD-10-CM | POA: Diagnosis not present

## 2024-05-02 DIAGNOSIS — N184 Chronic kidney disease, stage 4 (severe): Secondary | ICD-10-CM | POA: Diagnosis not present

## 2024-05-02 DIAGNOSIS — N2581 Secondary hyperparathyroidism of renal origin: Secondary | ICD-10-CM | POA: Diagnosis not present

## 2024-05-02 DIAGNOSIS — D751 Secondary polycythemia: Secondary | ICD-10-CM | POA: Diagnosis not present

## 2024-05-02 DIAGNOSIS — I10 Essential (primary) hypertension: Secondary | ICD-10-CM | POA: Diagnosis not present

## 2024-05-04 ENCOUNTER — Encounter: Payer: Self-pay | Admitting: Oncology

## 2024-05-04 ENCOUNTER — Inpatient Hospital Stay: Attending: Oncology | Admitting: Oncology

## 2024-05-04 ENCOUNTER — Encounter (HOSPITAL_COMMUNITY)

## 2024-05-04 ENCOUNTER — Inpatient Hospital Stay

## 2024-05-04 VITALS — BP 122/62 | HR 61 | Temp 97.6°F | Resp 18 | Wt 212.4 lb

## 2024-05-04 DIAGNOSIS — R911 Solitary pulmonary nodule: Secondary | ICD-10-CM | POA: Insufficient documentation

## 2024-05-04 DIAGNOSIS — Z807 Family history of other malignant neoplasms of lymphoid, hematopoietic and related tissues: Secondary | ICD-10-CM | POA: Insufficient documentation

## 2024-05-04 DIAGNOSIS — Z122 Encounter for screening for malignant neoplasm of respiratory organs: Secondary | ICD-10-CM

## 2024-05-04 DIAGNOSIS — J439 Emphysema, unspecified: Secondary | ICD-10-CM | POA: Diagnosis not present

## 2024-05-04 DIAGNOSIS — D751 Secondary polycythemia: Secondary | ICD-10-CM

## 2024-05-04 DIAGNOSIS — N184 Chronic kidney disease, stage 4 (severe): Secondary | ICD-10-CM | POA: Diagnosis not present

## 2024-05-04 DIAGNOSIS — F17210 Nicotine dependence, cigarettes, uncomplicated: Secondary | ICD-10-CM | POA: Insufficient documentation

## 2024-05-04 DIAGNOSIS — Z800 Family history of malignant neoplasm of digestive organs: Secondary | ICD-10-CM | POA: Insufficient documentation

## 2024-05-04 DIAGNOSIS — Z720 Tobacco use: Secondary | ICD-10-CM

## 2024-05-04 LAB — CBC WITH DIFFERENTIAL/PLATELET
Abs Immature Granulocytes: 0.02 K/uL (ref 0.00–0.07)
Basophils Absolute: 0.1 K/uL (ref 0.0–0.1)
Basophils Relative: 1 %
Eosinophils Absolute: 0 K/uL (ref 0.0–0.5)
Eosinophils Relative: 0 %
HCT: 56.2 % — ABNORMAL HIGH (ref 39.0–52.0)
Hemoglobin: 18.8 g/dL — ABNORMAL HIGH (ref 13.0–17.0)
Immature Granulocytes: 0 %
Lymphocytes Relative: 26 %
Lymphs Abs: 1.9 K/uL (ref 0.7–4.0)
MCH: 28.5 pg (ref 26.0–34.0)
MCHC: 33.5 g/dL (ref 30.0–36.0)
MCV: 85.3 fL (ref 80.0–100.0)
Monocytes Absolute: 0.7 K/uL (ref 0.1–1.0)
Monocytes Relative: 10 %
Neutro Abs: 4.5 K/uL (ref 1.7–7.7)
Neutrophils Relative %: 63 %
Platelets: 123 K/uL — ABNORMAL LOW (ref 150–400)
RBC: 6.59 MIL/uL — ABNORMAL HIGH (ref 4.22–5.81)
RDW: 16.5 % — ABNORMAL HIGH (ref 11.5–15.5)
WBC: 7.2 K/uL (ref 4.0–10.5)
nRBC: 0 % (ref 0.0–0.2)

## 2024-05-04 NOTE — Assessment & Plan Note (Signed)
 Encourage oral hydration and avoid nephrotoxins.

## 2024-05-04 NOTE — Assessment & Plan Note (Signed)
 Recommend smoke cessation. Recommend CT chest low-dose lung cancer screening

## 2024-05-04 NOTE — Progress Notes (Signed)
 Hematology/Oncology Consult note [phone] [phone]        REFERRING PROVIDER: Lateef, Munsoor, MD   CHIEF COMPLAINTS/REASON FOR VISIT:  Evaluation of erythrocytosis   ASSESSMENT & PLAN:   Erythrocytosis Labs are reviewed and discussed with patient.  Erythrocytosis is an abnormal elevation of hemoglobin and/or hematocrit in peripheral blood, and this can be caused by primary etiology, ie myeloproliferative disease, or secondary etiology, ie sleep apnea, smoking, testosterone etc  or familiar condition.  I will check erythropoietin , carbo monoxide level, JAK2 with reflex to other mutations, BCR-ABL1 FISH.     Today's CBC showed hematocrit of 55, hemoglobin 18, I recommend small volume phlebotomy to avoid hyperviscosity syndrome. Awaiting above workup.  CKD (chronic kidney disease) stage 4, GFR 15-29 ml/min (HCC) Encourage oral hydration and avoid nephrotoxins.    Tobacco abuse Recommend smoke cessation. Recommend CT chest low-dose lung cancer screening   Orders Placed This Encounter  Procedures   CT Chest Wo Contrast    Standing Status:   Future    Expected Date:   05/11/2024    Expiration Date:   05/04/2025    Preferred imaging location?:   [facility]   CBC with Differential/Platelet    Standing Status:   Future    Number of Occurrences:   1    Expected Date:   05/04/2024    Expiration Date:   08/02/2024   BCR-ABL1 FISH    Standing Status:   Future    Number of Occurrences:   1    Expected Date:   05/04/2024    Expiration Date:   08/02/2024   Carbon monoxide, blood (performed at ref lab)    Standing Status:   Future    Number of Occurrences:   1    Expected Date:   05/04/2024    Expiration Date:   08/02/2024   Erythropoietin     Standing Status:   Future    Number of Occurrences:   1    Expected Date:   05/04/2024    Expiration Date:   08/02/2024   JAK2 V617F rfx CALR/MPL/E12-15    Standing Status:   Future    Number of Occurrences:   1     Expected Date:   05/04/2024    Expiration Date:   08/02/2024   Follow-up in a few weeks to discuss results. All questions were answered. The patient knows to call the clinic with any problems, questions or concerns.  Zelphia Cap, MD, PhD [facility] Hematology Oncology 05/04/2024   HISTORY OF PRESENTING ILLNESS:   Joshua Roy is a  73 y.o.  male with PMH listed below was seen in consultation at the request of  Lateef, Munsoor, MD  for evaluation of erythrocytosis.  Patient has a history of iron  deficiency anemia and previously was seen by Dr. Clista.  Patient received IV Feraheme  treatments with good response.  Had colonoscopy and upper endoscopy by Dr. Jinny in February 2024 which was unremarkable   Patient has a history of chronic kidney disease.  He follows up with Dr. Marcelino.   Patient was referred to establish care due to progressive erythrocytosis. 04/27/2024, CBC showed Hemoglobin of 19.2 with hematocrit of 58.8.  He has a significant smoking history and has attempted to reduce smoking. He also has a history of COPD. He has not undergone lung cancer screening CTs.  he underwent two sleep studies approximately two years ago, but the results were inconclusive. He has lost 50 pounds over the past  year and a half, which he believes has improved his sleep quality and reduced snoring. No current snoring or sleep apnea symptoms are reported, and he notes improved sleep quality after weight loss.   MEDICAL HISTORY:  Past Medical History:  Diagnosis Date   (HFpEF) heart failure with preserved ejection fraction (HCC)    a. 07/2019 Echo: EF 60-65%. Mod LVH. Gr1 DD. No rwma. Nl RV size/fxn. Mildly dil LA; b. 11/2019 Echo: EF 55-60%, no rwma, mild LVH. Nl RV size/fxn. Mod dil LA; c. 03/2022 Echo: EF 60-65%, sev LVH, mildly reduced RV fxn, RVSP 78.28mmHg, triv MR, mild-mod TR, Ao root 38mm.   ACE-inhibitor cough    Anemia    Anxiety    Arrhythmia    atrial flutter   Bronchitis  06/27/2016   Carotid arterial disease    a. 09/2019 Carotid U/S: RICA 80-99%, LICA 1-39%; b. 09/2019 R CEA.   CKD (chronic kidney disease), stage IV (HCC)    COPD (chronic obstructive pulmonary disease) (HCC)    cath, mild inf. hypokinesis EF 49%, 100% ROA   Coronary artery disease    a. 2001 Cath: LM 10, LAD 60p/m, D1 30, LCX 20p, RCA 124m/d-->Med Rx; b. 05/2017 Ex Mv: Ex time 5:46, antlat twi @ rest, fixed infarct w/ mild peri-infarct ischemia. EF 38%; c. 08/2019 Cath: LM 40ost, 70d, LAD 76m, LCX 25m, RCA 80-90p, 165m; d. 09/2019 CABG x4: LIMA->LAD, VG->RI, VG->OM, VG->RPDA.   Diabetes mellitus type II    Hyperlipidemia    Hypertension    MI (myocardial infarction) (HCC) age 42   OA (osteoarthritis)    knee OA, injected 2012 by ortho   PAF (paroxysmal atrial fibrillation) (HCC)    a. 09/2019 post-op CABG-->Amio; b. 04/2022 s/p DCCV (150J).   Pneumonia    PSVT (paroxysmal supraventricular tachycardia)    a. 08/2019 Zio: 189 brief runs of SVT (fastest 174 x 6 beats, longest 15 beats @ 107).   Pulmonary hypertension (HCC)    a. 03/2022 Echo: RVSP 78.33mmHg.   Tobacco abuse     SURGICAL HISTORY: Past Surgical History:  Procedure Laterality Date   CARDIAC CATHETERIZATION  05/06/2000   @ [facility]   CARDIOVERSION N/A 05/22/2022   Procedure: CARDIOVERSION;  Surgeon: Perla Evalene PARAS, MD;  Location: [facility] ORS;  Service: Cardiovascular;  Laterality: N/A;   COLONOSCOPY WITH PROPOFOL  N/A 04/20/2018   Procedure: COLONOSCOPY WITH PROPOFOL ;  Surgeon: Jinny Carmine, MD;  Location: [facility] ENDOSCOPY;  Service: Endoscopy;  Laterality: N/A;   COLONOSCOPY WITH PROPOFOL  N/A 09/02/2022   Procedure: COLONOSCOPY WITH PROPOFOL ;  Surgeon: Jinny Carmine, MD;  Location: [facility] ENDOSCOPY;  Service: Endoscopy;  Laterality: N/A;   CORONARY ARTERY BYPASS GRAFT N/A 10/07/2019   Procedure: CORONARY ARTERY BYPASS GRAFTING (CABG) using LIMA to LAD; Endoscopic harvesting of left greater saphenous vein: SVG to RAMUS; SVG to OM1; SVG to  PDA.;  Surgeon: Lucas Dorise POUR, MD;  Location: [facility] OR;  Service: Open Heart Surgery;  Laterality: N/A;   ENDARTERECTOMY Right 10/07/2019   Procedure: ENDARTERECTOMY CAROTID;  Surgeon: Oris Krystal FALCON, MD;  Location: [facility] OR;  Service: Vascular;  Laterality: Right;   ENDOVEIN HARVEST OF GREATER SAPHENOUS VEIN Left 10/07/2019   Procedure: Jethro Rubins Of Greater Saphenous Vein;  Surgeon: Lucas Dorise POUR, MD;  Location: [facility] OR;  Service: Open Heart Surgery;  Laterality: Left;   ESOPHAGOGASTRODUODENOSCOPY N/A 09/02/2022   Procedure: ESOPHAGOGASTRODUODENOSCOPY (EGD);  Surgeon: Jinny Carmine, MD;  Location: [facility] ENDOSCOPY;  Service: Endoscopy;  Laterality: N/A;   ESOPHAGOGASTRODUODENOSCOPY ENDOSCOPY  KNEE ARTHROSCOPY  07/25/04   left   KNEE SURGERY  1969   right, open surgery- repair   RIGHT/LEFT HEART CATH AND CORONARY ANGIOGRAPHY Bilateral 09/08/2019   Procedure: RIGHT/LEFT HEART CATH AND CORONARY ANGIOGRAPHY;  Surgeon: Perla Evalene PARAS, MD;  Location: [facility] INVASIVE CV LAB;  Service: Cardiovascular;  Laterality: Bilateral;   TEE WITHOUT CARDIOVERSION N/A 10/07/2019   Procedure: TRANSESOPHAGEAL ECHOCARDIOGRAM (TEE);  Surgeon: Lucas Dorise POUR, MD;  Location: [facility] OR;  Service: Open Heart Surgery;  Laterality: N/A;   TOTAL KNEE ARTHROPLASTY Left 07/14/2016   Procedure: LEFT TOTAL KNEE ARTHROPLASTY;  Surgeon: Dempsey Moan, MD;  Location: [facility] ORS;  Service: Orthopedics;  Laterality: Left;   TOTAL KNEE ARTHROPLASTY Right 07/13/2017   Procedure: RIGHT TOTAL KNEE ARTHROPLASTY;  Surgeon: Moan Dempsey, MD;  Location: [facility] ORS;  Service: Orthopedics;  Laterality: Right;    SOCIAL HISTORY: Social History   Socioeconomic History   Marital status: Married    Spouse name: Janie   Number of children: 3   Years of education: Not on file   Highest education level: Not on file  Occupational History   Occupation: Geneticist, molecular, Database administrator  Tobacco Use   Smoking status: Every Day    Current packs/day: 1.00     Average packs/day: 1 pack/day for 52.0 years (52.0 ttl pk-yrs)    Types: Cigarettes   Smokeless tobacco: Never  Vaping Use   Vaping status: Never Used  Substance and Sexual Activity   Alcohol use: Yes    Alcohol/week: 0.0 standard drinks of alcohol    Comment: 5-6 cocktails, 1 times a week   Drug use: No   Sexual activity: Not on file  Other Topics Concern   Not on file  Social History Narrative   From [place].  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   Social Drivers of Corporate investment banker Strain: Low Risk  (01/28/2024)   Received from [facility]   Overall Financial Resource Strain (CARDIA)    Difficulty of Paying Living Expenses: Not hard at all  Food Insecurity: No Food Insecurity (01/28/2024)   Received from [facility]   Hunger Vital Sign    Within the past 12 months, you worried that your food would run out before you got the money to buy more.: Never true    Within the past 12 months, the food you bought just didn't last and you didn't have money to get more.: Never true  Transportation Needs: No Transportation Needs (01/28/2024)   Received from [facility] - Transportation    In the past 12 months, has lack of transportation kept you from medical appointments or from getting medications?: No    Lack of Transportation (Non-Medical): No  Physical Activity: Insufficiently Active (06/14/2021)   Exercise Vital Sign    Days of Exercise per Week: 2 days    Minutes of Exercise per Session: 20 min  Stress: No Stress Concern Present (06/14/2021)   [employer] of Occupational Health - Occupational Stress Questionnaire    Feeling of Stress : Not at all  Social Connections: Moderately Isolated (06/14/2021)   Social Connection and Isolation Panel    Frequency of Communication with Friends and Family: More than three times a week    Frequency of Social Gatherings with Friends and Family: More than  three times a week    Attends Religious Services: Never    Database administrator or Organizations:  No    Attends Banker Meetings: Never    Marital Status: Married  Catering manager Violence: Not At Risk (06/14/2021)   Humiliation, Afraid, Rape, and Kick questionnaire    Fear of Current or Ex-Partner: No    Emotionally Abused: No    Physically Abused: No    Sexually Abused: No    FAMILY HISTORY: Family History  Problem Relation Age of Onset   Heart disease Father        CAD, MI x 3   Hypertension Father    Alcohol abuse Father    Diabetes Father    Diabetes Sister    Cancer Brother        lymphoma, in remission   Colon cancer Brother    Heart disease Sister        CABG x 3   Prostate cancer Neg Hx     ALLERGIES:  is allergic to ramipril and pseudoephedrine-guaifenesin er.  MEDICATIONS:  Current Outpatient Medications  Medication Sig Dispense Refill   ALPRAZolam  (XANAX ) 0.25 MG tablet Take 1 tablet (0.25 mg total) by mouth 2 (two) times daily as needed for anxiety. 60 tablet 0   amiodarone  (PACERONE ) 200 MG tablet TAKE 1 TABLET BY MOUTH EVERY DAY 90 tablet 3   apixaban  (ELIQUIS ) 5 MG TABS tablet Take 1 tablet (5 mg total) by mouth 2 (two) times daily. 180 tablet 3   atorvastatin  (LIPITOR ) 80 MG tablet TAKE 1 TABLET BY MOUTH EVERY DAY 90 tablet 3   cyanocobalamin  (VITAMIN B12) 1000 MCG tablet Take 1,000 mcg by mouth daily.     dapagliflozin  propanediol (FARXIGA ) 10 MG TABS tablet Take 1 tablet (10 mg total) by mouth daily at 2 PM. 90 tablet 3   ferrous sulfate  325 (65 FE) MG EC tablet Take 1 tablet (325 mg total) by mouth every other day. 45 tablet 2   furosemide  (LASIX ) 40 MG tablet TAKE 2 TABLETS IN THE MORNING AND 1 TABLET AT NIGHT THAT IS 80 MG IN THE MORNING AND 40 MG AT NIGHT 270 tablet 1   glipiZIDE  (GLUCOTROL ) 5 MG tablet Take 5 mg by mouth daily before breakfast.     glucose blood (TRUE METRIX BLOOD GLUCOSE TEST) test strip 1 each by Other route daily.  Use as instructed 100 each 12   metoprolol  tartrate (LOPRESSOR ) 25 MG tablet TAKE 1 TABLET BY MOUTH TWICE A DAY 180 tablet 1   nitroGLYCERIN  (NITROSTAT ) 0.4 MG SL tablet Place 1 tablet (0.4 mg total) under the tongue every 5 (five) minutes as needed for chest pain. 25 tablet 3   pantoprazole  (PROTONIX ) 40 MG tablet TAKE 1 TABLET BY MOUTH DAILY BEFORE BREAKFAST 90 tablet 3   predniSONE  (STERAPRED UNI-PAK 48 TAB) 10 MG (48) TBPK tablet Take by mouth daily.     cyclobenzaprine (FLEXERIL) 5 MG tablet Take 5 mg by mouth 3 (three) times daily as needed. (Patient not taking: Reported on 05/04/2024)     traZODone  (DESYREL ) 50 MG tablet Take 50 mg by mouth at bedtime. (Patient not taking: Reported on 05/04/2024)     No current facility-administered medications for this visit.    Review of Systems  Constitutional:  Positive for fatigue. Negative for appetite change, chills, fever and unexpected weight change.  HENT:   Negative for hearing loss and voice change.   Eyes:  Negative for eye problems and icterus.  Respiratory:  Negative for chest tightness, cough and shortness of breath.   Cardiovascular:  Negative for chest pain and  leg swelling.  Gastrointestinal:  Negative for abdominal distention and abdominal pain.  Endocrine: Negative for hot flashes.  Genitourinary:  Negative for difficulty urinating, dysuria and frequency.   Musculoskeletal:  Negative for arthralgias.  Skin:  Negative for itching and rash.  Neurological:  Negative for light-headedness and numbness.  Hematological:  Negative for adenopathy. Does not bruise/bleed easily.  Psychiatric/Behavioral:  Negative for confusion.    PHYSICAL EXAMINATION:  Vitals:   05/04/24 1530  BP: 122/62  Pulse: 61  Resp: 18  Temp: 97.6 F (36.4 C)  SpO2: 93%   Filed Weights   05/04/24 1530  Weight: 212 lb 6.4 oz (96.3 kg)    Physical Exam Constitutional:      General: He is not in acute distress.    Appearance: He is obese.  HENT:      Head: Normocephalic and atraumatic.  Eyes:     General: No scleral icterus. Cardiovascular:     Rate and Rhythm: Normal rate and regular rhythm.     Heart sounds: Normal heart sounds.  Pulmonary:     Effort: Pulmonary effort is normal. No respiratory distress.     Breath sounds: No wheezing.  Abdominal:     General: Bowel sounds are normal. There is no distension.     Palpations: Abdomen is soft.  Musculoskeletal:        General: No deformity. Normal range of motion.     Cervical back: Normal range of motion and neck supple.  Skin:    General: Skin is warm and dry.     Findings: No erythema or rash.  Neurological:     Mental Status: He is alert and oriented to person, place, and time. Mental status is at baseline.  Psychiatric:        Mood and Affect: Mood normal.     LABORATORY DATA:  I have reviewed the data as listed    Latest Ref Rng & Units 05/04/2024    4:09 PM 05/01/2023   10:34 AM 10/24/2022   10:35 AM  CBC  WBC 4.0 - 10.5 K/uL 7.2  7.9  7.5   Hemoglobin 13.0 - 17.0 g/dL 81.1  83.1  82.9   Hematocrit 39.0 - 52.0 % 56.2  50.5  51.3   Platelets 150 - 400 K/uL 123  190  206       Latest Ref Rng & Units 05/19/2022   10:55 AM 04/04/2022    4:23 AM 04/03/2022    5:11 AM  CMP  Glucose 70 - 99 mg/dL 852  850  856   BUN 8 - 23 mg/dL 36  57  62   Creatinine 0.61 - 1.24 mg/dL 7.43  6.74  6.48   Sodium 135 - 145 mmol/L 140  139  138   Potassium 3.5 - 5.1 mmol/L 3.7  4.1  4.4   Chloride 98 - 111 mmol/L 98  95  94   CO2 22 - 32 mmol/L 30  37  33   Calcium  8.9 - 10.3 mg/dL 9.5  9.2  9.6       RADIOGRAPHIC STUDIES: I have personally reviewed the radiological images as listed and agreed with the findings in the report. No results found.

## 2024-05-04 NOTE — Assessment & Plan Note (Signed)
 Labs are reviewed and discussed with patient.  Erythrocytosis is an abnormal elevation of hemoglobin and/or hematocrit in peripheral blood, and this can be caused by primary etiology, ie myeloproliferative disease, or secondary etiology, ie sleep apnea, smoking, testosterone etc  or familiar condition.  I will check erythropoietin , carbo monoxide level, JAK2 with reflex to other mutations, BCR-ABL1 FISH.     Today's CBC showed hematocrit of 55, hemoglobin 18, I recommend small volume phlebotomy to avoid hyperviscosity syndrome. Awaiting above workup.

## 2024-05-05 ENCOUNTER — Telehealth: Payer: Self-pay | Admitting: Oncology

## 2024-05-05 ENCOUNTER — Encounter: Payer: Self-pay | Admitting: Oncology

## 2024-05-05 LAB — ERYTHROPOIETIN: Erythropoietin: 19.4 m[IU]/mL — ABNORMAL HIGH (ref 2.6–18.5)

## 2024-05-05 NOTE — Telephone Encounter (Signed)
 Pt called back and confirmed MD appt

## 2024-05-05 NOTE — Telephone Encounter (Signed)
 Left vm for pt about appt date/time in 4 weeks.   Scheduling phone number provided if this date/time does not work.

## 2024-05-09 LAB — CARBON MONOXIDE, BLOOD (PERFORMED AT REF LAB): Carbon Monoxide, Blood: 7.9 % — ABNORMAL HIGH (ref 0.0–3.6)

## 2024-05-09 LAB — BCR-ABL1 FISH
Cells Analyzed: 200
Cells Counted: 200

## 2024-05-10 DIAGNOSIS — Z23 Encounter for immunization: Secondary | ICD-10-CM | POA: Diagnosis not present

## 2024-05-11 ENCOUNTER — Ambulatory Visit
Admission: RE | Admit: 2024-05-11 | Discharge: 2024-05-11 | Disposition: A | Discharge status: Home / Self Care | Source: Ambulatory Visit | Attending: Oncology | Admitting: Oncology

## 2024-05-11 DIAGNOSIS — I700 Atherosclerosis of aorta: Secondary | ICD-10-CM | POA: Diagnosis not present

## 2024-05-11 DIAGNOSIS — R0609 Other forms of dyspnea: Secondary | ICD-10-CM | POA: Diagnosis not present

## 2024-05-11 DIAGNOSIS — J439 Emphysema, unspecified: Secondary | ICD-10-CM | POA: Diagnosis not present

## 2024-05-11 DIAGNOSIS — R911 Solitary pulmonary nodule: Secondary | ICD-10-CM | POA: Diagnosis not present

## 2024-05-11 DIAGNOSIS — Z720 Tobacco use: Secondary | ICD-10-CM | POA: Diagnosis not present

## 2024-05-11 LAB — JAK2 V617F RFX CALR/MPL/E12-15

## 2024-05-11 LAB — CALR +MPL + E12-E15  (REFLEX)

## 2024-05-25 ENCOUNTER — Inpatient Hospital Stay: Admitting: Oncology

## 2024-05-26 ENCOUNTER — Encounter: Payer: Self-pay | Admitting: Oncology

## 2024-05-26 ENCOUNTER — Inpatient Hospital Stay (HOSPITAL_BASED_OUTPATIENT_CLINIC_OR_DEPARTMENT_OTHER): Admitting: Oncology

## 2024-05-26 VITALS — BP 145/67 | HR 62 | Temp 95.7°F | Resp 18 | Wt 221.1 lb

## 2024-05-26 DIAGNOSIS — Z807 Family history of other malignant neoplasms of lymphoid, hematopoietic and related tissues: Secondary | ICD-10-CM | POA: Diagnosis not present

## 2024-05-26 DIAGNOSIS — D751 Secondary polycythemia: Secondary | ICD-10-CM

## 2024-05-26 DIAGNOSIS — Z800 Family history of malignant neoplasm of digestive organs: Secondary | ICD-10-CM | POA: Diagnosis not present

## 2024-05-26 DIAGNOSIS — N184 Chronic kidney disease, stage 4 (severe): Secondary | ICD-10-CM | POA: Diagnosis not present

## 2024-05-26 DIAGNOSIS — Z720 Tobacco use: Secondary | ICD-10-CM | POA: Diagnosis not present

## 2024-05-26 DIAGNOSIS — F17210 Nicotine dependence, cigarettes, uncomplicated: Secondary | ICD-10-CM | POA: Diagnosis not present

## 2024-05-26 DIAGNOSIS — R911 Solitary pulmonary nodule: Secondary | ICD-10-CM | POA: Diagnosis not present

## 2024-05-26 NOTE — Assessment & Plan Note (Addendum)
 Recommend smoke cessation.

## 2024-05-26 NOTE — Assessment & Plan Note (Addendum)
 Labs are reviewed and discussed with patient.  Erythrocytosis is secondary.  Increased carbo monoxide and epo level. negative JAK2 with reflex to other mutations,negative BCR-ABL1 FISH.     Recommend phlebotomy if Hct>=55.  Repeat blood work in 6 week +/- phlebotomy. Follow up in 3 months

## 2024-05-26 NOTE — Assessment & Plan Note (Signed)
 Follow-up CT in 1 year

## 2024-05-26 NOTE — Progress Notes (Signed)
 Hematology/Oncology Consult note [phone] [phone]        REFERRING PROVIDER: Valora Lynwood FALCON, MD   CHIEF COMPLAINTS/REASON FOR VISIT:  Secondary erythrocytosis   ASSESSMENT & PLAN:   Erythrocytosis Labs are reviewed and discussed with patient.  Erythrocytosis is secondary.  Increased carbo monoxide and epo level. negative JAK2 with reflex to other mutations,negative BCR-ABL1 FISH.     Recommend phlebotomy if Hct>=55.  Repeat blood work in 6 week +/- phlebotomy. Follow up in 3 months  Tobacco abuse Recommend smoke cessation.   Lung nodule Follow up CT in 1 year   Orders Placed This Encounter  Procedures   CBC (Cancer Center Only)    Standing Status:   Future    Expected Date:   08/26/2024    Expiration Date:   11/24/2024   Hemoglobin and Hematocrit (Cancer Center Only)    Standing Status:   Future    Expected Date:   07/14/2024    Expiration Date:   10/12/2024   Hemoglobin and Hematocrit (Cancer Center Only)    Standing Status:   Future    Expected Date:   06/02/2024    Expiration Date:   08/31/2024   Follow-up 3 months All questions were answered. The patient knows to call the clinic with any problems, questions or concerns.  Zelphia Cap, MD, PhD [facility] Hematology Oncology 05/26/2024   HISTORY OF PRESENTING ILLNESS:   Joshua Roy is a  73 y.o.  male with PMH listed below was seen in consultation at the request of  Valora Lynwood FALCON, MD  for evaluation of erythrocytosis.  Patient has a history of iron  deficiency anemia and previously was seen by Dr. Clista.  Patient received IV Feraheme  treatments with good response.  Had colonoscopy and upper endoscopy by Dr. Jinny in February 2024 which was unremarkable   Patient has a history of chronic kidney disease.  He follows up with Dr. Marcelino.   Patient was referred to establish care due to progressive erythrocytosis. 04/27/2024, CBC showed Hemoglobin of 19.2 with hematocrit of  58.8.  He has a significant smoking history and has attempted to reduce smoking. He also has a history of COPD. He has not undergone lung cancer screening CTs.  he underwent two sleep studies approximately two years ago, but the results were inconclusive. He has lost 50 pounds over the past year and a half, which he believes has improved his sleep quality and reduced snoring. No current snoring or sleep apnea symptoms are reported, and he notes improved sleep quality after weight loss.   .INTERVAL HISTORY Joshua Roy is a 73 y.o. male who has above history reviewed by me today presents for follow up visit for secondary erythrocytosis.  He has cut back on smoking. 10 cigarettes per day  MEDICAL HISTORY:  Past Medical History:  Diagnosis Date   (HFpEF) heart failure with preserved ejection fraction (HCC)    a. 07/2019 Echo: EF 60-65%. Mod LVH. Gr1 DD. No rwma. Nl RV size/fxn. Mildly dil LA; b. 11/2019 Echo: EF 55-60%, no rwma, mild LVH. Nl RV size/fxn. Mod dil LA; c. 03/2022 Echo: EF 60-65%, sev LVH, mildly reduced RV fxn, RVSP 78.59mmHg, triv MR, mild-mod TR, Ao root 38mm.   ACE-inhibitor cough    Anemia    Anxiety    Arrhythmia    atrial flutter   Bronchitis 06/27/2016   Carotid arterial disease    a. 09/2019 Carotid U/S: RICA 80-99%, LICA 1-39%; b. 09/2019 R CEA.  CKD (chronic kidney disease), stage IV (HCC)    COPD (chronic obstructive pulmonary disease) (HCC)    cath, mild inf. hypokinesis EF 49%, 100% ROA   Coronary artery disease    a. 2001 Cath: LM 10, LAD 60p/m, D1 30, LCX 20p, RCA 125m/d-->Med Rx; b. 05/2017 Ex Mv: Ex time 5:46, antlat twi @ rest, fixed infarct w/ mild peri-infarct ischemia. EF 38%; c. 08/2019 Cath: LM 40ost, 70d, LAD 44m, LCX 71m, RCA 80-90p, 168m; d. 09/2019 CABG x4: LIMA->LAD, VG->RI, VG->OM, VG->RPDA.   Diabetes mellitus type II    Hyperlipidemia    Hypertension    MI (myocardial infarction) (HCC) age 54   OA (osteoarthritis)    knee OA, injected 2012  by ortho   PAF (paroxysmal atrial fibrillation) (HCC)    a. 09/2019 post-op CABG-->Amio; b. 04/2022 s/p DCCV (150J).   Pneumonia    PSVT (paroxysmal supraventricular tachycardia)    a. 08/2019 Zio: 189 brief runs of SVT (fastest 174 x 6 beats, longest 15 beats @ 107).   Pulmonary hypertension (HCC)    a. 03/2022 Echo: RVSP 78.51mmHg.   Tobacco abuse     SURGICAL HISTORY: Past Surgical History:  Procedure Laterality Date   CARDIAC CATHETERIZATION  05/06/2000   @ [facility]   CARDIOVERSION N/A 05/22/2022   Procedure: CARDIOVERSION;  Surgeon: Perla Evalene PARAS, MD;  Location: [facility] ORS;  Service: Cardiovascular;  Laterality: N/A;   COLONOSCOPY WITH PROPOFOL  N/A 04/20/2018   Procedure: COLONOSCOPY WITH PROPOFOL ;  Surgeon: Jinny Carmine, MD;  Location: [facility] ENDOSCOPY;  Service: Endoscopy;  Laterality: N/A;   COLONOSCOPY WITH PROPOFOL  N/A 09/02/2022   Procedure: COLONOSCOPY WITH PROPOFOL ;  Surgeon: Jinny Carmine, MD;  Location: [facility] ENDOSCOPY;  Service: Endoscopy;  Laterality: N/A;   CORONARY ARTERY BYPASS GRAFT N/A 10/07/2019   Procedure: CORONARY ARTERY BYPASS GRAFTING (CABG) using LIMA to LAD; Endoscopic harvesting of left greater saphenous vein: SVG to RAMUS; SVG to OM1; SVG to PDA.;  Surgeon: Lucas Dorise POUR, MD;  Location: [facility] OR;  Service: Open Heart Surgery;  Laterality: N/A;   ENDARTERECTOMY Right 10/07/2019   Procedure: ENDARTERECTOMY CAROTID;  Surgeon: Oris Krystal FALCON, MD;  Location: [facility] OR;  Service: Vascular;  Laterality: Right;   ENDOVEIN HARVEST OF GREATER SAPHENOUS VEIN Left 10/07/2019   Procedure: Jethro Rubins Of Greater Saphenous Vein;  Surgeon: Lucas Dorise POUR, MD;  Location: [facility] OR;  Service: Open Heart Surgery;  Laterality: Left;   ESOPHAGOGASTRODUODENOSCOPY N/A 09/02/2022   Procedure: ESOPHAGOGASTRODUODENOSCOPY (EGD);  Surgeon: Jinny Carmine, MD;  Location: [facility] ENDOSCOPY;  Service: Endoscopy;  Laterality: N/A;   ESOPHAGOGASTRODUODENOSCOPY ENDOSCOPY     KNEE ARTHROSCOPY  07/25/04   left    KNEE SURGERY  1969   right, open surgery- repair   RIGHT/LEFT HEART CATH AND CORONARY ANGIOGRAPHY Bilateral 09/08/2019   Procedure: RIGHT/LEFT HEART CATH AND CORONARY ANGIOGRAPHY;  Surgeon: Perla Evalene PARAS, MD;  Location: [facility] INVASIVE CV LAB;  Service: Cardiovascular;  Laterality: Bilateral;   TEE WITHOUT CARDIOVERSION N/A 10/07/2019   Procedure: TRANSESOPHAGEAL ECHOCARDIOGRAM (TEE);  Surgeon: Lucas Dorise POUR, MD;  Location: [facility] OR;  Service: Open Heart Surgery;  Laterality: N/A;   TOTAL KNEE ARTHROPLASTY Left 07/14/2016   Procedure: LEFT TOTAL KNEE ARTHROPLASTY;  Surgeon: Dempsey Moan, MD;  Location: [facility] ORS;  Service: Orthopedics;  Laterality: Left;   TOTAL KNEE ARTHROPLASTY Right 07/13/2017   Procedure: RIGHT TOTAL KNEE ARTHROPLASTY;  Surgeon: Moan Dempsey, MD;  Location: [facility] ORS;  Service: Orthopedics;  Laterality: Right;    SOCIAL HISTORY: Social History  Socioeconomic History   Marital status: Married    Spouse name: Corky   Number of children: 3   Years of education: Not on file   Highest education level: Not on file  Occupational History   Occupation: Geneticist, molecular, database administrator  Tobacco Use   Smoking status: Every Day    Current packs/day: 1.00    Average packs/day: 1 pack/day for 52.0 years (52.0 ttl pk-yrs)    Types: Cigarettes   Smokeless tobacco: Never  Vaping Use   Vaping status: Never Used  Substance and Sexual Activity   Alcohol use: Yes    Alcohol/week: 0.0 standard drinks of alcohol    Comment: 5-6 cocktails, 1 times a week   Drug use: No   Sexual activity: Not on file  Other Topics Concern   Not on file  Social History Narrative   From [place].  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   Social Drivers of Corporate Investment Banker Strain: Low Risk  (01/28/2024)   Received from [facility]   Overall Financial Resource Strain (CARDIA)    Difficulty of Paying Living Expenses: Not hard at all  Food Insecurity: No Food  Insecurity (01/28/2024)   Received from [facility]   Hunger Vital Sign    Within the past 12 months, you worried that your food would run out before you got the money to buy more.: Never true    Within the past 12 months, the food you bought just didn't last and you didn't have money to get more.: Never true  Transportation Needs: No Transportation Needs (01/28/2024)   Received from [facility] - Transportation    In the past 12 months, has lack of transportation kept you from medical appointments or from getting medications?: No    Lack of Transportation (Non-Medical): No  Physical Activity: Insufficiently Active (06/14/2021)   Exercise Vital Sign    Days of Exercise per Week: 2 days    Minutes of Exercise per Session: 20 min  Stress: No Stress Concern Present (06/14/2021)   [employer] of Occupational Health - Occupational Stress Questionnaire    Feeling of Stress : Not at all  Social Connections: Moderately Isolated (06/14/2021)   Social Connection and Isolation Panel    Frequency of Communication with Friends and Family: More than three times a week    Frequency of Social Gatherings with Friends and Family: More than three times a week    Attends Religious Services: Never    Database Administrator or Organizations: No    Attends Banker Meetings: Never    Marital Status: Married  Catering Manager Violence: Not At Risk (06/14/2021)   Humiliation, Afraid, Rape, and Kick questionnaire    Fear of Current or Ex-Partner: No    Emotionally Abused: No    Physically Abused: No    Sexually Abused: No    FAMILY HISTORY: Family History  Problem Relation Age of Onset   Heart disease Father        CAD, MI x 3   Hypertension Father    Alcohol abuse Father    Diabetes Father    Diabetes Sister    Cancer Brother        lymphoma, in remission   Colon cancer Brother    Heart disease Sister        CABG x 3   Prostate cancer  Neg Hx  ALLERGIES:  is allergic to ramipril and pseudoephedrine-guaifenesin er.  MEDICATIONS:  Current Outpatient Medications  Medication Sig Dispense Refill   ALPRAZolam  (XANAX ) 0.25 MG tablet Take 1 tablet (0.25 mg total) by mouth 2 (two) times daily as needed for anxiety. 60 tablet 0   amiodarone  (PACERONE ) 200 MG tablet TAKE 1 TABLET BY MOUTH EVERY DAY 90 tablet 3   apixaban  (ELIQUIS ) 5 MG TABS tablet Take 1 tablet (5 mg total) by mouth 2 (two) times daily. 180 tablet 3   atorvastatin  (LIPITOR ) 80 MG tablet TAKE 1 TABLET BY MOUTH EVERY DAY 90 tablet 3   cyanocobalamin  (VITAMIN B12) 1000 MCG tablet Take 1,000 mcg by mouth daily.     dapagliflozin  propanediol (FARXIGA ) 10 MG TABS tablet Take 1 tablet (10 mg total) by mouth daily at 2 PM. 90 tablet 3   ferrous sulfate  325 (65 FE) MG EC tablet Take 1 tablet (325 mg total) by mouth every other day. 45 tablet 2   furosemide  (LASIX ) 40 MG tablet TAKE 2 TABLETS IN THE MORNING AND 1 TABLET AT NIGHT THAT IS 80 MG IN THE MORNING AND 40 MG AT NIGHT 270 tablet 1   glipiZIDE  (GLUCOTROL ) 5 MG tablet Take 5 mg by mouth daily before breakfast.     glucose blood (TRUE METRIX BLOOD GLUCOSE TEST) test strip 1 each by Other route daily. Use as instructed 100 each 12   metoprolol  tartrate (LOPRESSOR ) 25 MG tablet TAKE 1 TABLET BY MOUTH TWICE A DAY 180 tablet 1   nitroGLYCERIN  (NITROSTAT ) 0.4 MG SL tablet Place 1 tablet (0.4 mg total) under the tongue every 5 (five) minutes as needed for chest pain. 25 tablet 3   pantoprazole  (PROTONIX ) 40 MG tablet TAKE 1 TABLET BY MOUTH DAILY BEFORE BREAKFAST 90 tablet 3   cyclobenzaprine (FLEXERIL) 5 MG tablet Take 5 mg by mouth 3 (three) times daily as needed. (Patient not taking: Reported on 05/26/2024)     predniSONE  (STERAPRED UNI-PAK 48 TAB) 10 MG (48) TBPK tablet Take by mouth daily. (Patient not taking: Reported on 05/26/2024)     traZODone  (DESYREL ) 50 MG tablet Take 50 mg by mouth at bedtime. (Patient not taking:  Reported on 05/26/2024)     No current facility-administered medications for this visit.    Review of Systems  Constitutional:  Positive for fatigue. Negative for appetite change, chills, fever and unexpected weight change.  HENT:   Negative for hearing loss and voice change.   Eyes:  Negative for eye problems and icterus.  Respiratory:  Negative for chest tightness, cough and shortness of breath.   Cardiovascular:  Negative for chest pain and leg swelling.  Gastrointestinal:  Negative for abdominal distention and abdominal pain.  Endocrine: Negative for hot flashes.  Genitourinary:  Negative for difficulty urinating, dysuria and frequency.   Musculoskeletal:  Negative for arthralgias.  Skin:  Negative for itching and rash.  Neurological:  Negative for light-headedness and numbness.  Hematological:  Negative for adenopathy. Does not bruise/bleed easily.  Psychiatric/Behavioral:  Negative for confusion.    PHYSICAL EXAMINATION:  Vitals:   05/26/24 1033  BP: (!) 145/67  Pulse: 62  Resp: 18  Temp: (!) 95.7 F (35.4 C)  SpO2: 93%   Filed Weights   05/26/24 1033  Weight: 221 lb 1.6 oz (100.3 kg)    Physical Exam Constitutional:      General: He is not in acute distress.    Appearance: He is obese.  HENT:     Head: Normocephalic and  atraumatic.  Eyes:     General: No scleral icterus. Cardiovascular:     Rate and Rhythm: Normal rate.  Pulmonary:     Effort: Pulmonary effort is normal. No respiratory distress.  Abdominal:     General: There is no distension.  Musculoskeletal:        General: Normal range of motion.     Cervical back: Normal range of motion and neck supple.  Neurological:     Mental Status: He is alert and oriented to person, place, and time. Mental status is at baseline.  Psychiatric:        Mood and Affect: Mood normal.     LABORATORY DATA:  I have reviewed the data as listed    Latest Ref Rng & Units 05/04/2024    4:09 PM 05/01/2023   10:34 AM  10/24/2022   10:35 AM  CBC  WBC 4.0 - 10.5 K/uL 7.2  7.9  7.5   Hemoglobin 13.0 - 17.0 g/dL 81.1  83.1  82.9   Hematocrit 39.0 - 52.0 % 56.2  50.5  51.3   Platelets 150 - 400 K/uL 123  190  206       Latest Ref Rng & Units 05/19/2022   10:55 AM 04/04/2022    4:23 AM 04/03/2022    5:11 AM  CMP  Glucose 70 - 99 mg/dL 852  850  856   BUN 8 - 23 mg/dL 36  57  62   Creatinine 0.61 - 1.24 mg/dL 7.43  6.74  6.48   Sodium 135 - 145 mmol/L 140  139  138   Potassium 3.5 - 5.1 mmol/L 3.7  4.1  4.4   Chloride 98 - 111 mmol/L 98  95  94   CO2 22 - 32 mmol/L 30  37  33   Calcium  8.9 - 10.3 mg/dL 9.5  9.2  9.6       RADIOGRAPHIC STUDIES: I have personally reviewed the radiological images as listed and agreed with the findings in the report. CT Chest Wo Contrast Result Date: 05/15/2024 CLINICAL DATA:  Dyspnea on exertion for 2-3 months, tobacco abuse EXAM: CT CHEST WITHOUT CONTRAST TECHNIQUE: Multidetector CT imaging of the chest was performed following the standard protocol without IV contrast. RADIATION DOSE REDUCTION: This exam was performed according to the departmental dose-optimization program which includes automated exposure control, adjustment of the mA and/or kV according to patient size and/or use of iterative reconstruction technique. COMPARISON:  08/09/2021 FINDINGS: Cardiovascular: Unenhanced imaging of the heart is unremarkable without pericardial effusion. Normal caliber of the thoracic aorta. Postsurgical changes from prior CABG. Atherosclerosis throughout the aorta and native coronary vessels. Assessment of the vascular lumen cannot be performed without intravenous contrast. Mediastinum/Nodes: No enlarged mediastinal or axillary lymph nodes. Thyroid  gland, trachea, and esophagus demonstrate no significant findings. Lungs/Pleura: No acute airspace disease, effusion, or pneumothorax. Minimal subpleural scarring with slight upper lobe predominance. There is a 4 x 5 mm right apical  ground-glass nodule image 24/3. Upper Abdomen: No acute abnormality. Musculoskeletal: No acute or destructive bony abnormalities. Reconstructed images demonstrate no additional findings. IMPRESSION: 1. Right ground-glass pulmonary nodule within the upper lobe measuring 5 mm. Per Fleischner Society Guidelines, no routine follow-up imaging is recommended. These guidelines do not apply to immunocompromised patients and patients with cancer. Follow up in patients with significant comorbidities as clinically warranted. For lung cancer screening, adhere to Lung-RADS guidelines. Reference: Radiology. 2017; 284(1):228-43. 2. No acute intrathoracic process. 3. Aortic Atherosclerosis (ICD10-I70.0). Coronary artery atherosclerosis.  Electronically Signed   By: Ozell Daring M.D.   On: 05/15/2024 19:56

## 2024-06-02 ENCOUNTER — Inpatient Hospital Stay

## 2024-06-02 ENCOUNTER — Telehealth (HOSPITAL_BASED_OUTPATIENT_CLINIC_OR_DEPARTMENT_OTHER): Payer: Self-pay | Admitting: *Deleted

## 2024-06-02 ENCOUNTER — Inpatient Hospital Stay: Attending: Oncology

## 2024-06-02 DIAGNOSIS — D751 Secondary polycythemia: Secondary | ICD-10-CM | POA: Diagnosis not present

## 2024-06-02 LAB — HEMOGLOBIN AND HEMATOCRIT (CANCER CENTER ONLY)
HCT: 52 % (ref 39.0–52.0)
Hemoglobin: 17.5 g/dL — ABNORMAL HIGH (ref 13.0–17.0)

## 2024-06-02 NOTE — Telephone Encounter (Signed)
   Pre-operative Risk Assessment    Patient Name: Joshua Roy  DOB: [birth year] MRN: [id number]   Date of last office visit: 04/20/24 MEDFORD MEAGER, FNP Date of next office visit: NONE   Request for Surgical Clearance    Procedure:  LUMBAR EPIDURAL STEROID INJECTION  Date of Surgery:  Clearance TBD                                Surgeon: NOT LISTED Surgeon's Group or Practice Name:  [facility] Phone number:  [phone] [facility] Fax number:  [phone]   Type of Clearance Requested:   - Medical  - Pharmacy:  Hold Apixaban  (Eliquis ) x 3 DAYS PRIOR AND RESUME 24 HOURS POST PROCEDURE   Type of Anesthesia:  Not Indicated   Additional requests/questions:    Bonney Niels Jest   06/02/2024, 2:18 PM

## 2024-06-02 NOTE — Progress Notes (Signed)
 No phlebotomy today HCT 52 and goal is 55 or greater.

## 2024-06-03 NOTE — Telephone Encounter (Signed)
 Pharmacy please advise on holding Eliquis  prior to LUMBAR EPIDURAL STEROID INJECTION scheduled for TBD.Last labs (CBC 05/26/2024) (Renal Function 04/27/2024) Thank you.

## 2024-06-03 NOTE — Telephone Encounter (Signed)
 Medford,  You saw this patient on 04/20/2024. Per protocol we request that you comment on his cardiac risk to proceed with LUMBAR EPIDURAL STEROID INJECTION since it has been less than 2 months since evaluated in the office. Please send your comment to P CV Pre-Op Pool.  Thank you, Lamarr Satterfield DNP, ANP, AACC.

## 2024-06-06 NOTE — Telephone Encounter (Signed)
   Patient Name: Joshua Roy  DOB: [birth year] MRN: [id number]  Primary Cardiologist: Evalene Lunger, MD  Chart reviewed as part of pre-operative protocol coverage. Given past medical history and time since last visit, based on ACC/AHA guidelines, [name] is at acceptable risk for the planned procedure without further cardiovascular testing.   Regarding his oral anticoagulation, [name] may hold his eliquis  for 72 hours prior to the planned epidural injection with recommendation to resume post-procedure when felt to be feasible by surgical team.  Please call with questions.  Lonni Meager, NP 06/06/2024, 7:53 AM

## 2024-06-20 NOTE — Telephone Encounter (Signed)
 Patient ask that we re fax the pre opp clearance over to the office, states they havent received it yet

## 2024-06-28 ENCOUNTER — Ambulatory Visit: Admitting: Urology

## 2024-06-28 ENCOUNTER — Encounter: Payer: Self-pay | Admitting: Urology

## 2024-06-28 VITALS — BP 133/67 | HR 61 | Ht 70.0 in | Wt 210.0 lb

## 2024-06-28 DIAGNOSIS — N5201 Erectile dysfunction due to arterial insufficiency: Secondary | ICD-10-CM

## 2024-06-28 MED ORDER — AMBULATORY NON FORMULARY MEDICATION: 0 refills | Status: AC

## 2024-06-28 NOTE — Progress Notes (Unsigned)
 06/28/2024 11:02 AM   Joshua Roy 1951-03-20 [id number]  Referring provider: Valora Lynwood FALCON, MD [address] [facility] [place],  [place] [address]  Chief Complaint  Patient presents with   Erectile Dysfunction    HPI: Joshua Roy is a 73 y.o. male referred for evaluation of erectile dysfunction.  States his wife became ill ~3 years ago and he was his primary caregiver and not sexually active.   She passed away several months ago and he has a new partner but has been experiencing significant ED.  He denies partial erections.  No history of pain or curvature with erections He was given Rx sildenafil  by his PCP which has not been effective.  At 1 point he tried 300 mg of sildenafil  and had a partial erection which was not firm enough for penetration Significant organic risk factors including heart failure, coronary artery disease, carotid artery disease, diabetes mellitus, hyperlipidemia, hypertension, tobacco abuse (>50 pack years and current smoker) and antihypertensive/beta-blocker medication   PMH: Past Medical History:  Diagnosis Date   (HFpEF) heart failure with preserved ejection fraction (HCC)    a. 07/2019 Echo: EF 60-65%. Mod LVH. Gr1 DD. No rwma. Nl RV size/fxn. Mildly dil LA; b. 11/2019 Echo: EF 55-60%, no rwma, mild LVH. Nl RV size/fxn. Mod dil LA; c. 03/2022 Echo: EF 60-65%, sev LVH, mildly reduced RV fxn, RVSP 78.23mmHg, triv MR, mild-mod TR, Ao root 38mm.   ACE-inhibitor cough    Anemia    Anxiety    Arrhythmia    atrial flutter   Bronchitis 06/27/2016   Carotid arterial disease    a. 09/2019 Carotid U/S: RICA 80-99%, LICA 1-39%; b. 09/2019 R CEA.   CKD (chronic kidney disease), stage IV (HCC)    COPD (chronic obstructive pulmonary disease) (HCC)    cath, mild inf. hypokinesis EF 49%, 100% ROA   Coronary artery disease    a. 2001 Cath: LM 10, LAD 60p/m, D1 30, LCX 20p, RCA 115m/d-->Med Rx; b. 05/2017 Ex Mv: Ex time 5:46, antlat twi @  rest, fixed infarct w/ mild peri-infarct ischemia. EF 38%; c. 08/2019 Cath: LM 40ost, 70d, LAD 10m, LCX 80m, RCA 80-90p, 122m; d. 09/2019 CABG x4: LIMA->LAD, VG->RI, VG->OM, VG->RPDA.   Diabetes mellitus type II    Hyperlipidemia    Hypertension    MI (myocardial infarction) (HCC) age 45   OA (osteoarthritis)    knee OA, injected 2012 by ortho   PAF (paroxysmal atrial fibrillation) (HCC)    a. 09/2019 post-op CABG-->Amio; b. 04/2022 s/p DCCV (150J).   Pneumonia    PSVT (paroxysmal supraventricular tachycardia)    a. 08/2019 Zio: 189 brief runs of SVT (fastest 174 x 6 beats, longest 15 beats @ 107).   Pulmonary hypertension (HCC)    a. 03/2022 Echo: RVSP 78.106mmHg.   Tobacco abuse     Surgical History: Past Surgical History:  Procedure Laterality Date   CARDIAC CATHETERIZATION  05/06/2000   @ [facility]   CARDIOVERSION N/A 05/22/2022   Procedure: CARDIOVERSION;  Surgeon: Perla Evalene PARAS, MD;  Location: [facility] ORS;  Service: Cardiovascular;  Laterality: N/A;   COLONOSCOPY WITH PROPOFOL  N/A 04/20/2018   Procedure: COLONOSCOPY WITH PROPOFOL ;  Surgeon: Jinny Carmine, MD;  Location: [facility] ENDOSCOPY;  Service: Endoscopy;  Laterality: N/A;   COLONOSCOPY WITH PROPOFOL  N/A 09/02/2022   Procedure: COLONOSCOPY WITH PROPOFOL ;  Surgeon: Jinny Carmine, MD;  Location: [facility] ENDOSCOPY;  Service: Endoscopy;  Laterality: N/A;   CORONARY ARTERY BYPASS GRAFT N/A 10/07/2019  Procedure: CORONARY ARTERY BYPASS GRAFTING (CABG) using LIMA to LAD; Endoscopic harvesting of left greater saphenous vein: SVG to RAMUS; SVG to OM1; SVG to PDA.;  Surgeon: Lucas Dorise POUR, MD;  Location: [facility] OR;  Service: Open Heart Surgery;  Laterality: N/A;   ENDARTERECTOMY Right 10/07/2019   Procedure: ENDARTERECTOMY CAROTID;  Surgeon: Oris Krystal FALCON, MD;  Location: [facility] OR;  Service: Vascular;  Laterality: Right;   ENDOVEIN HARVEST OF GREATER SAPHENOUS VEIN Left 10/07/2019   Procedure: Jethro Rubins Of Greater Saphenous Vein;  Surgeon: Lucas Dorise POUR,  MD;  Location: [facility] OR;  Service: Open Heart Surgery;  Laterality: Left;   ESOPHAGOGASTRODUODENOSCOPY N/A 09/02/2022   Procedure: ESOPHAGOGASTRODUODENOSCOPY (EGD);  Surgeon: Jinny Carmine, MD;  Location: [facility] ENDOSCOPY;  Service: Endoscopy;  Laterality: N/A;   ESOPHAGOGASTRODUODENOSCOPY ENDOSCOPY     KNEE ARTHROSCOPY  07/25/04   left   KNEE SURGERY  1969   right, open surgery- repair   RIGHT/LEFT HEART CATH AND CORONARY ANGIOGRAPHY Bilateral 09/08/2019   Procedure: RIGHT/LEFT HEART CATH AND CORONARY ANGIOGRAPHY;  Surgeon: Perla Evalene PARAS, MD;  Location: [facility] INVASIVE CV LAB;  Service: Cardiovascular;  Laterality: Bilateral;   TEE WITHOUT CARDIOVERSION N/A 10/07/2019   Procedure: TRANSESOPHAGEAL ECHOCARDIOGRAM (TEE);  Surgeon: Lucas Dorise POUR, MD;  Location: [facility] OR;  Service: Open Heart Surgery;  Laterality: N/A;   TOTAL KNEE ARTHROPLASTY Left 07/14/2016   Procedure: LEFT TOTAL KNEE ARTHROPLASTY;  Surgeon: Dempsey Moan, MD;  Location: [facility] ORS;  Service: Orthopedics;  Laterality: Left;   TOTAL KNEE ARTHROPLASTY Right 07/13/2017   Procedure: RIGHT TOTAL KNEE ARTHROPLASTY;  Surgeon: Moan Dempsey, MD;  Location: [facility] ORS;  Service: Orthopedics;  Laterality: Right;    Home Medications:  Allergies as of 06/28/2024       Reactions   Ramipril Cough, Other (See Comments)   REACTION: Cough ramipril   Pseudoephedrine-guaifenesin Er Other (See Comments), Hypertension   Elevated BP, insomnia Elevated BP, insomnia        Medication List        Accurate as of June 28, 2024 11:02 AM. If you have any questions, ask your nurse or doctor.          STOP taking these medications    cyclobenzaprine 5 MG tablet Commonly known as: FLEXERIL Stopped by: Hoa Deriso C Taeshaun Rames   traZODone  50 MG tablet Commonly known as: DESYREL  Stopped by: Glendia JAYSON Barba       TAKE these medications    ALPRAZolam  0.25 MG tablet Commonly known as: XANAX  Take 1 tablet (0.25 mg total) by mouth 2 (two) times daily  as needed for anxiety.   amiodarone  200 MG tablet Commonly known as: PACERONE  TAKE 1 TABLET BY MOUTH EVERY DAY   apixaban  5 MG Tabs tablet Commonly known as: ELIQUIS  Take 1 tablet (5 mg total) by mouth 2 (two) times daily.   atorvastatin  80 MG tablet Commonly known as: LIPITOR  TAKE 1 TABLET BY MOUTH EVERY DAY   cyanocobalamin  1000 MCG tablet Commonly known as: VITAMIN B12 Take 1,000 mcg by mouth daily.   dapagliflozin  propanediol 10 MG Tabs tablet Commonly known as: Farxiga  Take 1 tablet (10 mg total) by mouth daily at 2 PM.   ferrous sulfate  325 (65 FE) MG EC tablet Take 1 tablet (325 mg total) by mouth every other day.   furosemide  40 MG tablet Commonly known as: LASIX  TAKE 2 TABLETS IN THE MORNING AND 1 TABLET AT NIGHT THAT IS 80 MG IN THE MORNING AND 40 MG AT NIGHT  glipiZIDE  5 MG tablet Commonly known as: GLUCOTROL  Take 5 mg by mouth daily before breakfast.   metoprolol  tartrate 25 MG tablet Commonly known as: LOPRESSOR  TAKE 1 TABLET BY MOUTH TWICE A DAY   nitroGLYCERIN  0.4 MG SL tablet Commonly known as: NITROSTAT  Place 1 tablet (0.4 mg total) under the tongue every 5 (five) minutes as needed for chest pain.   pantoprazole  40 MG tablet Commonly known as: PROTONIX  TAKE 1 TABLET BY MOUTH DAILY BEFORE BREAKFAST   predniSONE  10 MG (48) Tbpk tablet Commonly known as: STERAPRED UNI-PAK 48 TAB Take by mouth daily.   sildenafil  100 MG tablet Commonly known as: VIAGRA  Take 100 mg by mouth daily as needed.   True Metrix Blood Glucose Test test strip Generic drug: glucose blood 1 each by Other route daily. Use as instructed        Allergies:  Allergies  Allergen Reactions   Ramipril Cough and Other (See Comments)    REACTION: Cough  ramipril   Pseudoephedrine-Guaifenesin Er Other (See Comments) and Hypertension    Elevated BP, insomnia Elevated BP, insomnia    Family History: Family History  Problem Relation Age of Onset   Heart disease Father         CAD, MI x 3   Hypertension Father    Alcohol abuse Father    Diabetes Father    Diabetes Sister    Cancer Brother        lymphoma, in remission   Colon cancer Brother    Heart disease Sister        CABG x 3   Prostate cancer Neg Hx     Social History:  reports that he has been smoking cigarettes. He has a 52 pack-year smoking history. He has never used smokeless tobacco. He reports current alcohol use. He reports that he does not use drugs.   Physical Exam: BP 133/67   Pulse 61   Ht 5' 10 (1.778 m)   Wt 210 lb (95.3 kg)   BMI 30.13 kg/m   Constitutional:  Alert, No acute distress. HEENT: [place] AT Respiratory: Normal respiratory effort, no increased work of breathing. Psychiatric: Normal mood and affect.   Assessment & Plan:    1.  Erectile dysfunction We discussed ~70% of the ED is secondary to arterial insufficiency and he has significant vascular risk factors He tried sildenafil  at 300 mg and would not recommend any additional PDE 5 inhibitor as it is unlikely to be effective We discussed second line options of intracavernosal injections and vacuum erection devices.  We discussed the most common side effects of intracavernosal injections including priapism and corporal scarring He is not interested in vacuum erection devices but would like to pursue intracavernosal injections Rx Trimix was sent to [facility].  A PA appointment was made for injection training and he was instructed to bring the medication to that visit.   Glendia JAYSON Barba, MD  [facility] [address] [place], [place] [address] [phone]

## 2024-06-29 ENCOUNTER — Telehealth: Payer: Self-pay

## 2024-06-29 ENCOUNTER — Other Ambulatory Visit: Payer: Self-pay | Admitting: Cardiovascular Disease

## 2024-06-29 NOTE — Telephone Encounter (Signed)
 Pt LM on triage line-   Requesting a return call from Brittany regarding an appt.

## 2024-06-30 ENCOUNTER — Encounter: Payer: Self-pay | Admitting: Urology

## 2024-07-05 NOTE — Progress Notes (Signed)
° °  07/13/2024 10:12 AM  [name] [birth year] [id number]   Referring provider: Valora Lynwood FALCON, MD [address] [place],  [place] [address]  Urological history: 1.  Erectile dysfunction - Failed PDE 5 inhibitors  Chief Complaint  Patient presents with    trimix teaching   HPI: Joshua Roy is a 73 y.o. male who presents today for ICI titration.    Previous records reviewed.     He has been experiencing issues with ED for > 3 years.   He is having difficulty with achieving and maintaining erections.  He is no longer having nocturnal tumescence or having morning erections.  He reports persistent ED despite the use of PDE5i's.  No history of priapism, Peyronie's disease or penile trauma.     Physical Exam:  BP (!) 148/79   Pulse 60   Ht 5' 10 (1.778 m)   Wt 210 lb (95.3 kg)   SpO2 96%   BMI 30.13 kg/m   Constitutional:  Well nourished. Alert and oriented, No acute distress. GU: No CVA tenderness.  No bladder fullness or masses.  Patient with uncircumcised phallus.  Foreskin easily retracted  Urethral meatus is patent.  No penile discharge. No penile lesions or rashes.  Psychiatric: Normal mood and affect.   Procedure  Patient's left corpus cavernosum is identified.  An area near the base of the penis is cleansed with rubbing alcohol.  Careful to avoid the dorsal vein, 2 mcg of Trimix (papaverine  30 mg, phentolamine 1 mg and prostaglandin E1 10 mcg, Lot # [id number]  exp # [id number] is injected at a 90 degree angle into the left corpus cavernosum near the base of the penis.  Patient experienced penile fullness in 15 minutes.    Patient's right corpus cavernosum is identified.  An area near the base of the penis is cleansed with rubbing alcohol.  Careful to avoid the dorsal vein, 2 mcg of Trimix (papaverine  30 mg, phentolamine 1 mg and prostaglandin E1 10 mcg, Lot # [id number]$MzfnczAzqnmzIZPI_mrZlmOvyjwlOldrYbqZEDoQFbSFBtsUP$$MzfnczAzqnmzIZPI_mrZlmOvyjwlOldrYbqZEDoQFbSFBtsUP$  exp # [id number] is injected at a 90 degree angle into the right  corpus cavernosum near the base of the penis.  Patient experienced a semi erection in 15 minutes.    Patient's left corpus cavernosum is identified.  An area near the base of the penis is cleansed with rubbing alcohol.  Careful to avoid the dorsal vein, 2 mcg of Trimix (papaverine  30 mg, phentolamine 1 mg and prostaglandin E1 10 mcg, Lot # [id number]$MzfnczAzqnmzIZPI_zwoIeIkCsiRSAywEQjFIMYEMbfNDLRZp$$MzfnczAzqnmzIZPI_zwoIeIkCsiRSAywEQjFIMYEMbfNDLRZp$  exp # [id number] is injected at a 90 degree angle into the left corpus cavernosum near the base of the penis.  Patient experienced a firm erection in 15 minutes.    Assessment & Plan:    1.  Erectile dysfunction - taught proper injection technique, including sterile handling, correct anatomical location and dosing - Advised not to inject more than 0.6 mcg every other day - advised to use no more than once in 48-72 hours; instructed to seek care if erection persists beyond 4 hours    Return for Patient to call back with Trimix dose.  Clotilda Cornwall, PA-C   [facility] [address] [place], [place] [address] [phone]  Time Spent: Total time spent on the day of the encounter to include pre-visit record review, face-to-face time with the patient, and post-visit ordering of tests.  15 minutes.

## 2024-07-13 ENCOUNTER — Ambulatory Visit: Admitting: Urology

## 2024-07-13 ENCOUNTER — Encounter: Payer: Self-pay | Admitting: Urology

## 2024-07-13 VITALS — BP 148/79 | HR 60 | Ht 70.0 in | Wt 210.0 lb

## 2024-07-13 DIAGNOSIS — N5201 Erectile dysfunction due to arterial insufficiency: Secondary | ICD-10-CM

## 2024-07-13 MED ORDER — AMBULATORY NON FORMULARY MEDICATION: 0 refills | Status: AC

## 2024-07-13 NOTE — Patient Instructions (Signed)
 TRIMIX SELF-INJECTION INSTRUCTIONS    DETAILED PROCEDURE  1. GETTING SET UP  A. Proper hygiene is important. Wash your hands and keep the penis clean.  B. Assemble the following:  - Bottle of Trimix  - Alcohol pad  - Syringe  C. Keep the Trimix cold by returning the bottle to the refrigerator, or by placing the bottle in a cup of ice.   2. PREPARE THE SYRINGE  A. Wipe the rubber top of the vial with an alcohol pad.  B. After removing the cap of the needle, pull the plunger back to the desired dosage, (0.6) filling this volume with air. Use a new needle and syringe each time.  C. Insert the needle through the rubber top and inject the air into the vial.  D. Turn the vial with needle and syringe inserted upside down. Pull back on the syringe plunger in a slow and steady motion until the desired dosage is achieved.  E. Tap the side of the syringe (1cc tuberculin syringe with a 29 gauge needle) to allow any air bubbles to float towards the needle. Avoid having these air bubbles in the syringe when self-injecting by first injecting out the collected bubbles that may form.  F. Remove the needle from the bottle and replace the protective cap on the needle.    3. SELECT AND PREPARE THE SITE FOR INJECTION  A. The proper location for injection is at the 9-11 and 1-3 o'clock positions, between the base and mid-portion of the penis.(see diagram) Avoid the midline because of potential for injury to the urethra (6 o'clock; for urinary passage) and the penile arteries and nerves (near 12 o'clock). Avoid any visible veins or arteries on the surface.  B. Grasp and pull the head of the penis toward the side of your leg with the index finger and thumb (use the left hand, if right handed). While maintaining light tension, select a site for injection.  C. Clean the site with an alcohol pad.   4: INJECT TRIMIX AND APPLY COMPRESSION  A. With a steady and continuous motion, penetrate the skin with the needle at a  90 o angle. The needle should then be advanced to the hub. Slight resistance is encountered as the needle passes into the proper position within the erectile tissue (corporeal body).  B. Inject the Trimix over approximately 4 seconds. Withdraw the needle from the penis and apply compression to the injection site for approximately 1 minute. Several minutes of compression may be required to avoid bleeding, especially if you are an aspirin  user.  C. Replace the cap on the needle and dispose of properly.   If you experience a painful erection that will not go down, take four (30 mg) tablets of pseudoephedrine (Sudafed-not the extended release) and if the erection does not go down in the next hour or increases in pain, contact the office immediately or seek treatment in the ED

## 2024-07-14 ENCOUNTER — Inpatient Hospital Stay

## 2024-07-25 ENCOUNTER — Telehealth: Payer: Self-pay | Admitting: *Deleted

## 2024-07-25 NOTE — Telephone Encounter (Signed)
 Patient called in today and wanted to know if he  injected  his penis with trimix can his partner taste it. Advised patient it is a very small needle to inject into his penis ,Plus it does take time for the penis to raise. It could if the force of the sucking is very hard. If she gets any in the mouth, please spit and don't swallow. Also advised  to use a condom if he is worry about getting the medication in her vagina. Talked with Sam about how  to advise patient.

## 2024-08-02 ENCOUNTER — Telehealth: Payer: Self-pay | Admitting: Emergency Medicine

## 2024-08-02 NOTE — Telephone Encounter (Signed)
 Received a faxed request for an Eliquis  refill from [facility] ([place]). Contacted the patient to inform him that we are unable to fax prescriptions to pharmacies in [place]. A voicemail message was left requesting a return call.

## 2024-08-03 ENCOUNTER — Telehealth: Payer: Self-pay | Admitting: Oncology

## 2024-08-03 ENCOUNTER — Telehealth: Payer: Self-pay | Admitting: Cardiovascular Disease

## 2024-08-03 DIAGNOSIS — I4891 Unspecified atrial fibrillation: Secondary | ICD-10-CM

## 2024-08-03 NOTE — Telephone Encounter (Signed)
 Patient called to verify appointment after getting text reminder. He stated he didn't know of these appointments and this has happened several times in the past. He chooses to reschedule lab/phlebotomy on 1/14 due to being out of town. I rescheduled that to 1/20 and informed him of the previously scheduled lab/md/phlebotomy appointments on 1/28. He then requested to cancel 1/20 appointments and just do everything on 1/28.  Appointments cancelled as requested.  He ask to be called when appointments are being made due to working out of town and not knowing when he will be her for appointments.   Thank you

## 2024-08-03 NOTE — Telephone Encounter (Signed)
" °*  STAT* If patient is at the pharmacy, call can be transferred to refill team.   1. Which medications need to be refilled? (please list name of each medication and dose if known) apixaban  (ELIQUIS ) 5 MG TABS tablet    2. Would you like to learn more about the convenience, safety, & potential cost savings by using the [facility]? No    3. Are you open to using the [facility] (Type [facility]. ). No    4. Which pharmacy/location (including street and city if local pharmacy) is medication to be sent to?Your [place] [facility]    5. Do they need a 30 day or 90 day supply? 90 day   "

## 2024-08-04 ENCOUNTER — Other Ambulatory Visit: Payer: Self-pay | Admitting: Cardiovascular Disease

## 2024-08-04 ENCOUNTER — Ambulatory Visit: Admitting: Urology

## 2024-08-04 NOTE — Telephone Encounter (Signed)
 Prescription refill request for Eliquis  received. Indication: A FIB Last office visit: 04/20/24 Scr: 2.36 Age: 74 Weight: 210 lbs.

## 2024-08-04 NOTE — Telephone Encounter (Signed)
 Error

## 2024-08-05 NOTE — Telephone Encounter (Signed)
 Called patient and left message for call back.

## 2024-08-08 NOTE — Progress Notes (Unsigned)
 "    08/09/2024 9:44 AM   Joshua Roy January 09, 1951 [id number]  Referring provider: Valora Lynwood FALCON, MD [address] [place],  [place] [address]  Urological history: 1.  Erectile dysfunction - failed PDE5i's  - Trimix (30/1/10) 0.6 cc prn   No chief complaint on file.  HPI: Joshua Roy is a 74 y.o. man who presents today for erectile dysfunction.   Previous records reviewed.  Hemoglobin A1c (07/2024) 8.0  Serum creatinine (07/2024) 2.6, eGFR 25   PMH: Past Medical History:  Diagnosis Date   (HFpEF) heart failure with preserved ejection fraction (HCC)    a. 07/2019 Echo: EF 60-65%. Mod LVH. Gr1 DD. No rwma. Nl RV size/fxn. Mildly dil LA; b. 11/2019 Echo: EF 55-60%, no rwma, mild LVH. Nl RV size/fxn. Mod dil LA; c. 03/2022 Echo: EF 60-65%, sev LVH, mildly reduced RV fxn, RVSP 78.37mmHg, triv MR, mild-mod TR, Ao root 38mm.   ACE-inhibitor cough    Anemia    Anxiety    Arrhythmia    atrial flutter   Bronchitis 06/27/2016   Carotid arterial disease    a. 09/2019 Carotid U/S: RICA 80-99%, LICA 1-39%; b. 09/2019 R CEA.   CKD (chronic kidney disease), stage IV (HCC)    COPD (chronic obstructive pulmonary disease) (HCC)    cath, mild inf. hypokinesis EF 49%, 100% ROA   Coronary artery disease    a. 2001 Cath: LM 10, LAD 60p/m, D1 30, LCX 20p, RCA 110m/d-->Med Rx; b. 05/2017 Ex Mv: Ex time 5:46, antlat twi @ rest, fixed infarct w/ mild peri-infarct ischemia. EF 38%; c. 08/2019 Cath: LM 40ost, 70d, LAD 104m, LCX 45m, RCA 80-90p, 110m; d. 09/2019 CABG x4: LIMA->LAD, VG->RI, VG->OM, VG->RPDA.   Diabetes mellitus type II    Hyperlipidemia    Hypertension    MI (myocardial infarction) (HCC) age 9   OA (osteoarthritis)    knee OA, injected 2012 by ortho   PAF (paroxysmal atrial fibrillation) (HCC)    a. 09/2019 post-op CABG-->Amio; b. 04/2022 s/p DCCV (150J).   Pneumonia    PSVT (paroxysmal supraventricular tachycardia)    a. 08/2019 Zio: 189 brief runs  of SVT (fastest 174 x 6 beats, longest 15 beats @ 107).   Pulmonary hypertension (HCC)    a. 03/2022 Echo: RVSP 78.41mmHg.   Tobacco abuse     Surgical History: Past Surgical History:  Procedure Laterality Date   CARDIAC CATHETERIZATION  05/06/2000   @ [facility]   CARDIOVERSION N/A 05/22/2022   Procedure: CARDIOVERSION;  Surgeon: Perla Evalene PARAS, MD;  Location: [facility] ORS;  Service: Cardiovascular;  Laterality: N/A;   COLONOSCOPY WITH PROPOFOL  N/A 04/20/2018   Procedure: COLONOSCOPY WITH PROPOFOL ;  Surgeon: Jinny Carmine, MD;  Location: [facility] ENDOSCOPY;  Service: Endoscopy;  Laterality: N/A;   COLONOSCOPY WITH PROPOFOL  N/A 09/02/2022   Procedure: COLONOSCOPY WITH PROPOFOL ;  Surgeon: Jinny Carmine, MD;  Location: [facility] ENDOSCOPY;  Service: Endoscopy;  Laterality: N/A;   CORONARY ARTERY BYPASS GRAFT N/A 10/07/2019   Procedure: CORONARY ARTERY BYPASS GRAFTING (CABG) using LIMA to LAD; Endoscopic harvesting of left greater saphenous vein: SVG to RAMUS; SVG to OM1; SVG to PDA.;  Surgeon: Lucas Dorise POUR, MD;  Location: [facility] OR;  Service: Open Heart Surgery;  Laterality: N/A;   ENDARTERECTOMY Right 10/07/2019   Procedure: ENDARTERECTOMY CAROTID;  Surgeon: Oris Krystal FALCON, MD;  Location: [facility] OR;  Service: Vascular;  Laterality: Right;   ENDOVEIN HARVEST OF GREATER SAPHENOUS VEIN Left 10/07/2019   Procedure:  Jethro Rubins Of Greater Saphenous Vein;  Surgeon: Lucas Dorise POUR, MD;  Location: [facility] OR;  Service: Open Heart Surgery;  Laterality: Left;   ESOPHAGOGASTRODUODENOSCOPY N/A 09/02/2022   Procedure: ESOPHAGOGASTRODUODENOSCOPY (EGD);  Surgeon: Jinny Carmine, MD;  Location: [facility] ENDOSCOPY;  Service: Endoscopy;  Laterality: N/A;   ESOPHAGOGASTRODUODENOSCOPY ENDOSCOPY     KNEE ARTHROSCOPY  07/25/04   left   KNEE SURGERY  1969   right, open surgery- repair   RIGHT/LEFT HEART CATH AND CORONARY ANGIOGRAPHY Bilateral 09/08/2019   Procedure: RIGHT/LEFT HEART CATH AND CORONARY ANGIOGRAPHY;  Surgeon: Perla Evalene PARAS, MD;   Location: [facility] INVASIVE CV LAB;  Service: Cardiovascular;  Laterality: Bilateral;   TEE WITHOUT CARDIOVERSION N/A 10/07/2019   Procedure: TRANSESOPHAGEAL ECHOCARDIOGRAM (TEE);  Surgeon: Lucas Dorise POUR, MD;  Location: [facility] OR;  Service: Open Heart Surgery;  Laterality: N/A;   TOTAL KNEE ARTHROPLASTY Left 07/14/2016   Procedure: LEFT TOTAL KNEE ARTHROPLASTY;  Surgeon: Dempsey Moan, MD;  Location: [facility] ORS;  Service: Orthopedics;  Laterality: Left;   TOTAL KNEE ARTHROPLASTY Right 07/13/2017   Procedure: RIGHT TOTAL KNEE ARTHROPLASTY;  Surgeon: Moan Dempsey, MD;  Location: [facility] ORS;  Service: Orthopedics;  Laterality: Right;    Home Medications:  Allergies as of 08/09/2024       Reactions   Ramipril Cough, Other (See Comments)   REACTION: Cough ramipril   Pseudoephedrine-guaifenesin Er Other (See Comments), Hypertension   Elevated BP, insomnia Elevated BP, insomnia        Medication List        Accurate as of August 08, 2024  9:44 AM. If you have any questions, ask your nurse or doctor.          ALPRAZolam  0.25 MG tablet Commonly known as: XANAX  Take 1 tablet (0.25 mg total) by mouth 2 (two) times daily as needed for anxiety.   AMBULATORY NON FORMULARY MEDICATION Trimix (30/1/10)-(Pap/Phent/PGE)  Test Dose  1ml vial   Qty #3 Refills 0  [facility] [phone] Fax [phone]   AMBULATORY NON FORMULARY MEDICATION Medication Name: Trimix (30/1/10)-(Pap/Phent/PGE)  Dosage: Inject 0.6 cc per injection   Vial 1ml  Qty #10 Refills 6  [facility] [phone] Fax [phone]   amiodarone  200 MG tablet Commonly known as: PACERONE  TAKE 1 TABLET BY MOUTH EVERY DAY   apixaban  5 MG Tabs tablet Commonly known as: ELIQUIS  Take 1 tablet (5 mg total) by mouth 2 (two) times daily.   atorvastatin  80 MG tablet Commonly known as: LIPITOR  TAKE 1 TABLET BY MOUTH EVERY DAY   cyanocobalamin  1000 MCG tablet Commonly known as: VITAMIN B12 Take 1,000 mcg  by mouth daily.   dapagliflozin  propanediol 10 MG Tabs tablet Commonly known as: Farxiga  Take 1 tablet (10 mg total) by mouth daily at 2 PM.   ferrous sulfate  325 (65 FE) MG EC tablet Take 1 tablet (325 mg total) by mouth every other day.   furosemide  40 MG tablet Commonly known as: LASIX  TAKE 2 TABLETS IN THE MORNING AND 1 TAB AT NIGHT (THAT IS 80 MG IN THE MORNING AND 40 MG AT NIGHT)   glipiZIDE  5 MG tablet Commonly known as: GLUCOTROL  Take 5 mg by mouth daily before breakfast.   metoprolol  tartrate 25 MG tablet Commonly known as: LOPRESSOR  TAKE 1 TABLET BY MOUTH TWICE A DAY   nitroGLYCERIN  0.4 MG SL tablet Commonly known as: NITROSTAT  Place 1 tablet (0.4 mg total) under the tongue every 5 (five) minutes as needed for chest pain.   pantoprazole  40 MG  tablet Commonly known as: PROTONIX  TAKE 1 TABLET BY MOUTH DAILY BEFORE BREAKFAST   predniSONE  10 MG (48) Tbpk tablet Commonly known as: STERAPRED UNI-PAK 48 TAB Take by mouth daily.   sildenafil  100 MG tablet Commonly known as: VIAGRA  Take 100 mg by mouth daily as needed.   True Metrix Blood Glucose Test test strip Generic drug: glucose blood 1 each by Other route daily. Use as instructed        Allergies: Allergies[1]  Family History: Family History  Problem Relation Age of Onset   Heart disease Father        CAD, MI x 3   Hypertension Father    Alcohol abuse Father    Diabetes Father    Diabetes Sister    Cancer Brother        lymphoma, in remission   Colon cancer Brother    Heart disease Sister        CABG x 3   Prostate cancer Neg Hx     Social History:  reports that he has been smoking cigarettes. He has a 52 pack-year smoking history. He has never used smokeless tobacco. He reports current alcohol use. He reports that he does not use drugs.  ROS: Pertinent ROS in HPI  Physical Exam: There were no vitals taken for this visit.  Constitutional:  Well nourished. Alert and oriented, No acute  distress. HEENT: [place] AT, moist mucus membranes.  Trachea midline, no masses. Cardiovascular: No clubbing, cyanosis, or edema. Respiratory: Normal respiratory effort, no increased work of breathing. GI: Abdomen is soft, non tender, non distended, no abdominal masses. Liver and spleen not palpable.  No hernias appreciated.  Stool sample for occult testing is not indicated.   GU: No CVA tenderness.  No bladder fullness or masses.  Patient with circumcised/uncircumcised phallus. ***Foreskin easily retracted***  Urethral meatus is patent.  No penile discharge. No penile lesions or rashes. Scrotum without lesions, cysts, rashes and/or edema.  Testicles are located scrotally bilaterally. No masses are appreciated in the testicles. Left and right epididymis are normal. Rectal: Patient with  normal sphincter tone. Anus and perineum without scarring or rashes. No rectal masses are appreciated. Prostate is approximately *** grams, *** nodules are appreciated. Seminal vesicles are normal. Skin: No rashes, bruises or suspicious lesions. Lymph: No cervical or inguinal adenopathy. Neurologic: Grossly intact, no focal deficits, moving all 4 extremities. Psychiatric: Normal mood and affect.  Laboratory Data: See Epic and HPI   I have reviewed the labs.   Pertinent Imaging: N/A  Assessment & Plan:  ***  1.  Erectile dysfunction - ***  No follow-ups on file.  These notes generated with voice recognition software. I apologize for typographical errors.  CLOTILDA HELON RIGGERS  [facility] [address]  [address] [place], [place] [address] [phone]     [1]  Allergies Allergen Reactions   Ramipril Cough and Other (See Comments)    REACTION: Cough  ramipril   Pseudoephedrine-Guaifenesin Er Other (See Comments) and Hypertension    Elevated BP, insomnia Elevated BP, insomnia   "

## 2024-08-08 NOTE — Telephone Encounter (Signed)
" °*  STAT* If patient is at the pharmacy, call can be transferred to refill team.   1. Which medications need to be refilled? (please list name of each medication and dose if known)   apixaban  (ELIQUIS ) 5 MG TABS tablet   2. Would you like to learn more about the convenience, safety, & potential cost savings by using the [facility]?   3. Are you open to using the [facility] (Type [facility]. ).  4. Which pharmacy/location (including street and city if local pharmacy) is medication to be sent to?  Your [facility] Ph# [phone] of [phone]  5. Do they need a 30 day or 90 day supply?   90 day   Patient stated he still has some medication.  Patient has appointment scheduled with Dr. Gollan on 1/19. "

## 2024-08-09 ENCOUNTER — Ambulatory Visit: Admitting: Urology

## 2024-08-09 DIAGNOSIS — N5201 Erectile dysfunction due to arterial insufficiency: Secondary | ICD-10-CM

## 2024-08-09 NOTE — Telephone Encounter (Signed)
 Was going to send in a refill for Eliquis , however, the pharmacy information below, cannot be found in our system by name, or phone, or fax #.  I left the pt a message to let him know and asked him to call back..  it may be that prescription has to be printed and given to pt to mail?    Prescription refill request for Eliquis  received. Indication: AFIB Last office visit: 03/2024 , SCHEDULED 08/15/24 Scr:  2.6 (08/03/24) Age: 74 Weight: 95.3 KG

## 2024-08-10 ENCOUNTER — Inpatient Hospital Stay

## 2024-08-12 MED ORDER — APIXABAN 5 MG PO TABS: 5.0000 mg | ORAL_TABLET | Freq: Two times a day (BID) | ORAL | 3 refills | Status: DC

## 2024-08-12 NOTE — Progress Notes (Signed)
 This cardiology Office Note  Date:  08/15/2024   ID:  Joshua Roy, DOB 01-Jan-1951, MRN [id number]  PCP:  Valora Lynwood FALCON, MD   Chief Complaint  Patient presents with   4 month follow up     Patient denies any chest pain or shortness of breath.     HPI:  Joshua Roy is a 74 year old gentleman with a history of  coronary artery disease,  occluded mid to distal RCA  by cardiac catheterization in 2001,  residual 60% proximal to mid LAD disease at that time, 30% diagonal disease, 10% left main disease, 20% proximal left circumflex disease.  bypass surgery March 2021,  Active smoker, <1 ppd hyperlipidemia,  obesity and diabetes  PAD, carotid stenosis, s/p carotid endarterectomy March 2021, postoperative atrial fibrillation Admission to the hospital for CHF August 2023 Diabetes Chronic renal sufficiency, followed by nephrology, creatinine greater than 3 He presents today for follow-up of his coronary artery disease, CHF, hx of CABG  Last seen by myself in clinic 05/2023  Lost wife <2 years ago, pulm fibrosis Since that time has started exercise program, trying to be more social Exercise, gym, lost 50 pounds No sx from bradycardia Has met a lady friend down in [place] , visits her periodically  Asymptomatic bradycardia Tolerating amiodarone  200 daily with metoprolol  to tartrate 25 twice daily Heart rate typically running mid 50s up to 60 at home Rare dizziness when sitting upwards, no significant orthostasis  Smoking less <12 cigarettes per day Remains on Lasix  80 in the morning 40 in the afternoon  Cardioversion for afib 05/22/22  Lab work reviewed CR 2.26, stable BUN 39 LDL 73, total chol 136  CT lung screeening: aortic athero Results reviewed  EKG personally reviewed by myself on todays visit EKG Interpretation Date/Time:  Monday August 15 2024 10:28:57 EST Ventricular Rate:  52 PR Interval:  290 QRS Duration:  118 QT Interval:  500 QTC  Calculation: 465 R Axis:   101  Text Interpretation: Sinus bradycardia with sinus arrhythmia with 1st degree A-V block Rightward axis Septal infarct (cited on or before 09-Jun-2023) When compared with ECG of 20-Apr-2024 14:05, No significant change was found Confirmed by Perla Lye ([id number]) on 08/15/2024 11:04:38 AM   Other past medical history reviewed 8/23 hospital admission for acute on chronic diastolic CHF gained approximately 20 pounds.    echocardiogram 04/01/2022-ejection fraction 60 to 65%.  Severe LVH.  Right ventricular systolic function is mildly reduced.  Severely elevated pulmonary artery systolic pressure.  CKD stage IV-creatinine 3.25 from 3.51 from 3.62 from 3.94 on Lasix  drip.  His creatinine was 3.25 on discharge.    long hospital course April 2021 Multiorgan failure   Past medical history reviewed  May 2021, CHF exacerbation, sent to the hospital multiorgan failure COPD, anemia, acute on chronic kidney failure, difficulty with diuresis in the setting of CHF,  Management limited by his anxiety and desire to go home Chronic hypoxia saturations in the 80s on 10 L oxygen  throughout his hospital course -Also treated for bronchitis Poorly controlled diabetes   PMH:   has a past medical history of (HFpEF) heart failure with preserved ejection fraction (HCC), ACE-inhibitor cough, Anemia, Anxiety, Arrhythmia, Bronchitis (06/27/2016), Carotid arterial disease, CKD (chronic kidney disease), stage IV (HCC), COPD (chronic obstructive pulmonary disease) (HCC), Coronary artery disease, Diabetes mellitus type II, Hyperlipidemia, Hypertension, MI (myocardial infarction) (HCC) (age 81), OA (osteoarthritis), PAF (paroxysmal atrial fibrillation) (HCC), Pneumonia, PSVT (paroxysmal supraventricular tachycardia), Pulmonary hypertension (HCC), and Tobacco abuse.  PSH:    Past Surgical History:  Procedure Laterality Date   CARDIAC CATHETERIZATION  05/06/2000   @ [facility]   CARDIOVERSION N/A  05/22/2022   Procedure: CARDIOVERSION;  Surgeon: Perla Evalene PARAS, MD;  Location: [facility] ORS;  Service: Cardiovascular;  Laterality: N/A;   COLONOSCOPY WITH PROPOFOL  N/A 04/20/2018   Procedure: COLONOSCOPY WITH PROPOFOL ;  Surgeon: Jinny Carmine, MD;  Location: [facility] ENDOSCOPY;  Service: Endoscopy;  Laterality: N/A;   COLONOSCOPY WITH PROPOFOL  N/A 09/02/2022   Procedure: COLONOSCOPY WITH PROPOFOL ;  Surgeon: Jinny Carmine, MD;  Location: [facility] ENDOSCOPY;  Service: Endoscopy;  Laterality: N/A;   CORONARY ARTERY BYPASS GRAFT N/A 10/07/2019   Procedure: CORONARY ARTERY BYPASS GRAFTING (CABG) using LIMA to LAD; Endoscopic harvesting of left greater saphenous vein: SVG to RAMUS; SVG to OM1; SVG to PDA.;  Surgeon: Lucas Dorise POUR, MD;  Location: [facility] OR;  Service: Open Heart Surgery;  Laterality: N/A;   ENDARTERECTOMY Right 10/07/2019   Procedure: ENDARTERECTOMY CAROTID;  Surgeon: Oris Krystal FALCON, MD;  [facility] OR;  Service: Vascular;  Laterality: Right;   ENDOVEIN HARVEST OF GREATER SAPHENOUS VEIN Left 10/07/2019   Procedure: Jethro Rubins Of Greater Saphenous Vein;  Surgeon: Lucas Dorise POUR, MD;  Location: [facility] OR;  Service: Open Heart Surgery;  Laterality: Left;   ESOPHAGOGASTRODUODENOSCOPY N/A 09/02/2022   Procedure: ESOPHAGOGASTRODUODENOSCOPY (EGD);  Surgeon: Jinny Carmine, MD;  Location: [facility] ENDOSCOPY;  Service: Endoscopy;  Laterality: N/A;   ESOPHAGOGASTRODUODENOSCOPY ENDOSCOPY     KNEE ARTHROSCOPY  07/25/04   left   KNEE SURGERY  1969   right, open surgery- repair   RIGHT/LEFT HEART CATH AND CORONARY ANGIOGRAPHY Bilateral 09/08/2019   Procedure: RIGHT/LEFT HEART CATH AND CORONARY ANGIOGRAPHY;  Surgeon: Perla Evalene PARAS, MD;  Location: [facility] INVASIVE CV LAB;  Service: Cardiovascular;  Laterality: Bilateral;   TEE WITHOUT CARDIOVERSION N/A 10/07/2019   Procedure: TRANSESOPHAGEAL ECHOCARDIOGRAM (TEE);  Surgeon: Lucas Dorise POUR, MD;  Location: [facility] OR;  Service: Open Heart Surgery;  Laterality: N/A;   TOTAL KNEE  ARTHROPLASTY Left 07/14/2016   Procedure: LEFT TOTAL KNEE ARTHROPLASTY;  Surgeon: Dempsey Moan, MD;  Location: [facility] ORS;  Service: Orthopedics;  Laterality: Left;   TOTAL KNEE ARTHROPLASTY Right 07/13/2017   Procedure: RIGHT TOTAL KNEE ARTHROPLASTY;  Surgeon: Moan Dempsey, MD;  Location: [facility] ORS;  Service: Orthopedics;  Laterality: Right;    Current Outpatient Medications  Medication Sig Dispense Refill   ALPRAZolam  (XANAX ) 0.25 MG tablet Take 1 tablet (0.25 mg total) by mouth 2 (two) times daily as needed for anxiety. 60 tablet 0   AMBULATORY NON FORMULARY MEDICATION Trimix (30/1/10)-(Pap/Phent/PGE)  Test Dose  1ml vial   Qty #3 Refills 0  [facility] [phone] Fax [phone] 3 vial 0   AMBULATORY NON FORMULARY MEDICATION Medication Name: Trimix (30/1/10)-(Pap/Phent/PGE)  Dosage: Inject 0.6 cc per injection   Vial 1ml  Qty #10 Refills 6  [facility] [phone] Fax [phone] 10 mL 0   amiodarone  (PACERONE ) 200 MG tablet TAKE 1 TABLET BY MOUTH EVERY DAY 90 tablet 3   apixaban  (ELIQUIS ) 5 MG TABS tablet Take 1 tablet (5 mg total) by mouth 2 (two) times daily. 180 tablet 3   atorvastatin  (LIPITOR ) 80 MG tablet TAKE 1 TABLET BY MOUTH EVERY DAY 90 tablet 3   cyanocobalamin  (VITAMIN B12) 1000 MCG tablet Take 1,000 mcg by mouth daily.     dapagliflozin  propanediol (FARXIGA ) 10 MG TABS tablet Take 1 tablet (10 mg total) by mouth daily at 2 PM. 90 tablet 3  ferrous sulfate  325 (65 FE) MG EC tablet Take 1 tablet (325 mg total) by mouth every other day. 45 tablet 2   furosemide  (LASIX ) 40 MG tablet TAKE 2 TABLETS IN THE MORNING AND 1 TAB AT NIGHT (THAT IS 80 MG IN THE MORNING AND 40 MG AT NIGHT) 270 tablet 0   glipiZIDE  (GLUCOTROL ) 5 MG tablet Take 5 mg by mouth daily before breakfast.     glucose blood (TRUE METRIX BLOOD GLUCOSE TEST) test strip 1 each by Other route daily. Use as instructed 100 each 12   metoprolol  tartrate (LOPRESSOR ) 25 MG tablet TAKE 1  TABLET BY MOUTH TWICE A DAY 180 tablet 1   nitroGLYCERIN  (NITROSTAT ) 0.4 MG SL tablet Place 1 tablet (0.4 mg total) under the tongue every 5 (five) minutes as needed for chest pain. 25 tablet 3   pantoprazole  (PROTONIX ) 40 MG tablet TAKE 1 TABLET BY MOUTH DAILY BEFORE BREAKFAST 90 tablet 3   predniSONE  (STERAPRED UNI-PAK 48 TAB) 10 MG (48) TBPK tablet Take by mouth daily.     sildenafil  (VIAGRA ) 100 MG tablet Take 100 mg by mouth daily as needed.     No current facility-administered medications for this visit.    Allergies:   Ramipril and Pseudoephedrine-guaifenesin er   Social History:  The patient  reports that he has been smoking cigarettes. He has a 52 pack-year smoking history. He has never used smokeless tobacco. He reports current alcohol use. He reports that he does not use drugs.   Family History:   family history includes Alcohol abuse in his father; Cancer in his brother; Colon cancer in his brother; Diabetes in his father and sister; Heart disease in his father and sister; Hypertension in his father.    Review of Systems: Review of Systems  Constitutional: Negative.   HENT: Negative.    Respiratory: Negative.    Cardiovascular: Negative.   Gastrointestinal: Negative.   Musculoskeletal: Negative.   Neurological: Negative.   Psychiatric/Behavioral: Negative.    All other systems reviewed and are negative.  PHYSICAL EXAM: VS:  BP (!) 112/58 (BP Location: Left Arm, Patient Position: Sitting, Cuff Size: Normal)   Pulse (!) 52   Ht 5' 9 (1.753 m)   Wt 220 lb 8 oz (100 kg)   SpO2 96%   BMI 32.56 kg/m  , BMI Body mass index is 32.56 kg/m. Constitutional:  oriented to person, place, and time. No distress.  HENT:  Head: Grossly normal Eyes:  no discharge. No scleral icterus.  Neck: No JVD, no carotid bruits  Cardiovascular: Regular rate and rhythm, no murmurs appreciated Pulmonary/Chest: Clear to auscultation bilaterally, no wheezes or rales Abdominal: Soft.  no  distension.  no tenderness.  Musculoskeletal: Normal range of motion Neurological:  normal muscle tone. Coordination normal. No atrophy Skin: Skin warm and dry Psychiatric: normal affect, pleasant   Recent Labs: 04/20/2024: TSH 0.378 05/04/2024: Platelets 123 06/02/2024: Hemoglobin 17.5    Lipid Panel Lab Results  Component Value Date   CHOL 124 06/13/2021   HDL 39 (L) 06/13/2021   LDLCALC 63 06/13/2021   TRIG 138 06/13/2021    Wt Readings from Last 3 Encounters:  08/15/24 220 lb 8 oz (100 kg)  07/13/24 210 lb (95.3 kg)  06/28/24 210 lb (95.3 kg)     ASSESSMENT AND PLAN:  Atrial flutter/atrial fibrillation cardioversion October 2023, maintaining normal sinus rhythm -Asymptomatic bradycardia, for now recommend he continue metoprolol  25 twice daily, amiodarone  200 daily, Eliquis  5 twice daily -For worsening orthostasis  symptoms may need to cut metoprolol  tartrate down to 12.5 twice daily  Coronary artery disease of native artery of native heart with stable angina pectoris [facility]) bypass surgery 2021 Currently with no symptoms of angina. No further workup at this time. Continue current medication regimen.  Smoking cessation recommended  type 2 diabetes mellitus with circulatory disorder  Weight trending downward A1c has jumped up 8.0 Recommend continued diet restriction, weight loss Working with primary care  Chronic diastolic CHF Appears euvolemic On high-dose diuretic Lasix  80 in the morning 40 in the evening, renal function elevated but stable  Chronic renal insufficiency Followed by nephrology Creatinine 2.6, stable  Mixed hyperlipidemia Cholesterol is at goal on the current lipid regimen. No changes to the medications were made.  Essential hypertension Blood pressure is well controlled on today's visit. No changes made to the medications.  DISORDER, TOBACCO USE Continues to smoke less than 1 pack/day We have encouraged him to continue to work on weaning his  cigarettes and smoking cessation. He will continue to work on this and does not want any assistance with chantix.    Centrilobular emphysema (HCC)  COPD,  Smoking cessation recommended  obesity (HCC) Weight trending downward through dietary changes    Orders Placed This Encounter  Procedures   EKG 12-Lead     Signed, Velinda Lunger, M.D., Ph.D. 08/15/2024  [facility] [place], [place] [phone]

## 2024-08-12 NOTE — Telephone Encounter (Signed)
 Called and spoke with patient. Informed patient that we are not allowed to fax prescription outside of the country. Informed patient that I can print the prescription and he can pick it to take to his preferred pharmacy. Patient verbalizes understanding.

## 2024-08-12 NOTE — Telephone Encounter (Signed)
 Prescription placed in office for pickup.

## 2024-08-12 NOTE — Telephone Encounter (Signed)
 Patient returned call with fax number the [facility] pharmacy [phone] please advise

## 2024-08-15 ENCOUNTER — Encounter: Payer: Self-pay | Admitting: Cardiovascular Disease

## 2024-08-15 ENCOUNTER — Ambulatory Visit: Attending: Cardiovascular Disease | Admitting: Cardiovascular Disease

## 2024-08-15 VITALS — BP 112/58 | HR 52 | Ht 69.0 in | Wt 220.5 lb

## 2024-08-15 DIAGNOSIS — E1142 Type 2 diabetes mellitus with diabetic polyneuropathy: Secondary | ICD-10-CM | POA: Insufficient documentation

## 2024-08-15 DIAGNOSIS — I483 Typical atrial flutter: Secondary | ICD-10-CM | POA: Diagnosis not present

## 2024-08-15 DIAGNOSIS — E782 Mixed hyperlipidemia: Secondary | ICD-10-CM | POA: Insufficient documentation

## 2024-08-15 DIAGNOSIS — N184 Chronic kidney disease, stage 4 (severe): Secondary | ICD-10-CM | POA: Diagnosis not present

## 2024-08-15 DIAGNOSIS — I25118 Atherosclerotic heart disease of native coronary artery with other forms of angina pectoris: Secondary | ICD-10-CM | POA: Insufficient documentation

## 2024-08-15 DIAGNOSIS — I4891 Unspecified atrial fibrillation: Secondary | ICD-10-CM | POA: Diagnosis not present

## 2024-08-15 DIAGNOSIS — I5032 Chronic diastolic (congestive) heart failure: Secondary | ICD-10-CM | POA: Insufficient documentation

## 2024-08-15 DIAGNOSIS — I10 Essential (primary) hypertension: Secondary | ICD-10-CM | POA: Insufficient documentation

## 2024-08-15 MED ORDER — METOPROLOL TARTRATE 25 MG PO TABS: 25.0000 mg | ORAL_TABLET | Freq: Two times a day (BID) | ORAL | 3 refills | Status: AC

## 2024-08-15 MED ORDER — AMIODARONE HCL 200 MG PO TABS: 200.0000 mg | ORAL_TABLET | Freq: Every day | ORAL | 3 refills | Status: AC

## 2024-08-15 MED ORDER — DAPAGLIFLOZIN PROPANEDIOL 10 MG PO TABS: 10.0000 mg | ORAL_TABLET | Freq: Every day | ORAL | 3 refills | Status: AC

## 2024-08-15 MED ORDER — ATORVASTATIN CALCIUM 80 MG PO TABS: 80.0000 mg | ORAL_TABLET | Freq: Every day | ORAL | 3 refills | Status: AC

## 2024-08-15 MED ORDER — APIXABAN 5 MG PO TABS: 5.0000 mg | ORAL_TABLET | Freq: Two times a day (BID) | ORAL | 3 refills | Status: AC

## 2024-08-15 MED ORDER — FUROSEMIDE 40 MG PO TABS: ORAL_TABLET | ORAL | 0 refills | Status: AC

## 2024-08-15 NOTE — Patient Instructions (Addendum)
 Medication Instructions:  Your physician recommends that you continue on your current medications as directed. Please refer to the Current Medication list given to you today.   *If you need a refill on your cardiac medications before your next appointment, please call your pharmacy*  Lab Work: No labs ordered today  If you have labs (blood work) drawn today and your tests are completely normal, you will receive your results only by: MyChart Message (if you have MyChart) OR A paper copy in the mail If you have any lab test that is abnormal or we need to change your treatment, we will call you to review the results.  Testing/Procedures: No test ordered today   Follow-Up: At [facility], you and your health needs are our priority.  As part of our continuing mission to provide you with exceptional heart care, our providers are all part of one team.  This team includes your primary Cardiologist (physician) and Advanced Practice Providers or APPs (Physician Assistants and Nurse Practitioners) who all work together to provide you with the care you need, when you need it.  Your next appointment:   12 month(s)  Provider:   You may see Timothy Gollan, MD or one of the following Advanced Practice Providers on your designated Care Team:   Lonni Meager, NP Lesley Maffucci, PA-C Bernardino Bring, PA-C Cadence Lisbon, PA-C Tylene Lunch, NP Barnie Hila, NP

## 2024-08-15 NOTE — Patient Instructions (Signed)

## 2024-08-16 ENCOUNTER — Inpatient Hospital Stay

## 2024-08-21 NOTE — Progress Notes (Unsigned)
 "    08/23/2024 7:02 AM   Joshua Roy March 18, 1951 [id number]  Referring provider: Valora Lynwood FALCON, MD [address] [place],  [place] [address]  Urological history: 1.  Erectile dysfunction - failed PDE5i's  - failed Trimix (30/1/10) 0.6 cc prn   No chief complaint on file.  HPI: Joshua Roy is a 74 y.o. man who presents today for erectile dysfunction.   Previous records reviewed.  Hemoglobin A1c (07/2024) 8.0  Serum creatinine (07/2024) 2.6, eGFR 25   PMH: Past Medical History:  Diagnosis Date   (HFpEF) heart failure with preserved ejection fraction (HCC)    a. 07/2019 Echo: EF 60-65%. Mod LVH. Gr1 DD. No rwma. Nl RV size/fxn. Mildly dil LA; b. 11/2019 Echo: EF 55-60%, no rwma, mild LVH. Nl RV size/fxn. Mod dil LA; c. 03/2022 Echo: EF 60-65%, sev LVH, mildly reduced RV fxn, RVSP 78.23mmHg, triv MR, mild-mod TR, Ao root 38mm.   ACE-inhibitor cough    Anemia    Anxiety    Arrhythmia    atrial flutter   Bronchitis 06/27/2016   Carotid arterial disease    a. 09/2019 Carotid U/S: RICA 80-99%, LICA 1-39%; b. 09/2019 R CEA.   CKD (chronic kidney disease), stage IV (HCC)    COPD (chronic obstructive pulmonary disease) (HCC)    cath, mild inf. hypokinesis EF 49%, 100% ROA   Coronary artery disease    a. 2001 Cath: LM 10, LAD 60p/m, D1 30, LCX 20p, RCA 131m/d-->Med Rx; b. 05/2017 Ex Mv: Ex time 5:46, antlat twi @ rest, fixed infarct w/ mild peri-infarct ischemia. EF 38%; c. 08/2019 Cath: LM 40ost, 70d, LAD 80m, LCX 51m, RCA 80-90p, 180m; d. 09/2019 CABG x4: LIMA->LAD, VG->RI, VG->OM, VG->RPDA.   Diabetes mellitus type II    Hyperlipidemia    Hypertension    MI (myocardial infarction) (HCC) age 43   OA (osteoarthritis)    knee OA, injected 2012 by ortho   PAF (paroxysmal atrial fibrillation) (HCC)    a. 09/2019 post-op CABG-->Amio; b. 04/2022 s/p DCCV (150J).   Pneumonia    PSVT (paroxysmal supraventricular tachycardia)    a. 08/2019 Zio: 189  brief runs of SVT (fastest 174 x 6 beats, longest 15 beats @ 107).   Pulmonary hypertension (HCC)    a. 03/2022 Echo: RVSP 78.68mmHg.   Tobacco abuse     Surgical History: Past Surgical History:  Procedure Laterality Date   CARDIAC CATHETERIZATION  05/06/2000   @ [facility]   CARDIOVERSION N/A 05/22/2022   Procedure: CARDIOVERSION;  Surgeon: Perla Evalene PARAS, MD;  Location: [facility] ORS;  Service: Cardiovascular;  Laterality: N/A;   COLONOSCOPY WITH PROPOFOL  N/A 04/20/2018   Procedure: COLONOSCOPY WITH PROPOFOL ;  Surgeon: Jinny Carmine, MD;  Location: [facility] ENDOSCOPY;  Service: Endoscopy;  Laterality: N/A;   COLONOSCOPY WITH PROPOFOL  N/A 09/02/2022   Procedure: COLONOSCOPY WITH PROPOFOL ;  Surgeon: Jinny Carmine, MD;  Location: [facility] ENDOSCOPY;  Service: Endoscopy;  Laterality: N/A;   CORONARY ARTERY BYPASS GRAFT N/A 10/07/2019   Procedure: CORONARY ARTERY BYPASS GRAFTING (CABG) using LIMA to LAD; Endoscopic harvesting of left greater saphenous vein: SVG to RAMUS; SVG to OM1; SVG to PDA.;  Surgeon: Lucas Dorise POUR, MD;  Location: [facility] OR;  Service: Open Heart Surgery;  Laterality: N/A;   ENDARTERECTOMY Right 10/07/2019   Procedure: ENDARTERECTOMY CAROTID;  Surgeon: Oris Krystal FALCON, MD;  Location: [facility] OR;  Service: Vascular;  Laterality: Right;   ENDOVEIN HARVEST OF GREATER SAPHENOUS VEIN Left 10/07/2019  Procedure: Jethro Rubins Of Greater Saphenous Vein;  Surgeon: Lucas Dorise POUR, MD;  Location: [facility] OR;  Service: Open Heart Surgery;  Laterality: Left;   ESOPHAGOGASTRODUODENOSCOPY N/A 09/02/2022   Procedure: ESOPHAGOGASTRODUODENOSCOPY (EGD);  Surgeon: Jinny Carmine, MD;  Location: [facility] ENDOSCOPY;  Service: Endoscopy;  Laterality: N/A;   ESOPHAGOGASTRODUODENOSCOPY ENDOSCOPY     KNEE ARTHROSCOPY  07/25/04   left   KNEE SURGERY  1969   right, open surgery- repair   RIGHT/LEFT HEART CATH AND CORONARY ANGIOGRAPHY Bilateral 09/08/2019   Procedure: RIGHT/LEFT HEART CATH AND CORONARY ANGIOGRAPHY;  Surgeon: Perla Evalene PARAS, MD;  Location: [facility] INVASIVE CV LAB;  Service: Cardiovascular;  Laterality: Bilateral;   TEE WITHOUT CARDIOVERSION N/A 10/07/2019   Procedure: TRANSESOPHAGEAL ECHOCARDIOGRAM (TEE);  Surgeon: Lucas Dorise POUR, MD;  Location: [facility] OR;  Service: Open Heart Surgery;  Laterality: N/A;   TOTAL KNEE ARTHROPLASTY Left 07/14/2016   Procedure: LEFT TOTAL KNEE ARTHROPLASTY;  Surgeon: Dempsey Moan, MD;  Location: [facility] ORS;  Service: Orthopedics;  Laterality: Left;   TOTAL KNEE ARTHROPLASTY Right 07/13/2017   Procedure: RIGHT TOTAL KNEE ARTHROPLASTY;  Surgeon: Moan Dempsey, MD;  Location: [facility] ORS;  Service: Orthopedics;  Laterality: Right;    Home Medications:  Allergies as of 08/23/2024       Reactions   Ramipril Cough, Other (See Comments)   REACTION: Cough ramipril   Pseudoephedrine-guaifenesin Er Other (See Comments), Hypertension   Elevated BP, insomnia Elevated BP, insomnia        Medication List        Accurate as of August 21, 2024  7:02 AM. If you have any questions, ask your nurse or doctor.          ALPRAZolam  0.25 MG tablet Commonly known as: XANAX  Take 1 tablet (0.25 mg total) by mouth 2 (two) times daily as needed for anxiety.   AMBULATORY NON FORMULARY MEDICATION Trimix (30/1/10)-(Pap/Phent/PGE)  Test Dose  1ml vial   Qty #3 Refills 0  [facility] [phone] Fax [phone]   AMBULATORY NON FORMULARY MEDICATION Medication Name: Trimix (30/1/10)-(Pap/Phent/PGE)  Dosage: Inject 0.6 cc per injection   Vial 1ml  Qty #10 Refills 6  [facility] [phone] Fax [phone]   amiodarone  200 MG tablet Commonly known as: PACERONE  Take 1 tablet (200 mg total) by mouth daily.   apixaban  5 MG Tabs tablet Commonly known as: ELIQUIS  Take 1 tablet (5 mg total) by mouth 2 (two) times daily.   atorvastatin  80 MG tablet Commonly known as: LIPITOR  Take 1 tablet (80 mg total) by mouth daily.   cyanocobalamin  1000 MCG  tablet Commonly known as: VITAMIN B12 Take 1,000 mcg by mouth daily.   dapagliflozin  propanediol 10 MG Tabs tablet Commonly known as: Farxiga  Take 1 tablet (10 mg total) by mouth daily at 2 PM.   ferrous sulfate  325 (65 FE) MG EC tablet Take 1 tablet (325 mg total) by mouth every other day.   furosemide  40 MG tablet Commonly known as: LASIX  TAKE 2 TABLETS IN THE MORNING AND 1 TABLET AT NIGHT ( 80 MG IN THE AM, 40 MG IN PM)   glipiZIDE  5 MG tablet Commonly known as: GLUCOTROL  Take 5 mg by mouth daily before breakfast.   metoprolol  tartrate 25 MG tablet Commonly known as: LOPRESSOR  Take 1 tablet (25 mg total) by mouth 2 (two) times daily.   nitroGLYCERIN  0.4 MG SL tablet Commonly known as: NITROSTAT  Place 1 tablet (0.4 mg total) under the tongue every 5 (five) minutes as needed for  chest pain.   pantoprazole  40 MG tablet Commonly known as: PROTONIX  TAKE 1 TABLET BY MOUTH DAILY BEFORE BREAKFAST   predniSONE  10 MG (48) Tbpk tablet Commonly known as: STERAPRED UNI-PAK 48 TAB Take by mouth daily.   sildenafil  100 MG tablet Commonly known as: VIAGRA  Take 100 mg by mouth daily as needed.   True Metrix Blood Glucose Test test strip Generic drug: glucose blood 1 each by Other route daily. Use as instructed        Allergies: Allergies[1]  Family History: Family History  Problem Relation Age of Onset   Heart disease Father        CAD, MI x 3   Hypertension Father    Alcohol abuse Father    Diabetes Father    Diabetes Sister    Cancer Brother        lymphoma, in remission   Colon cancer Brother    Heart disease Sister        CABG x 3   Prostate cancer Neg Hx     Social History:  reports that he has been smoking cigarettes. He has a 52 pack-year smoking history. He has never used smokeless tobacco. He reports current alcohol use. He reports that he does not use drugs.  ROS: Pertinent ROS in HPI  Physical Exam: There were no vitals taken for this visit.   Constitutional:  Well nourished. Alert and oriented, No acute distress. HEENT: [place] AT, moist mucus membranes.  Trachea midline, no masses. Cardiovascular: No clubbing, cyanosis, or edema. Respiratory: Normal respiratory effort, no increased work of breathing. GI: Abdomen is soft, non tender, non distended, no abdominal masses. Liver and spleen not palpable.  No hernias appreciated.  Stool sample for occult testing is not indicated.   GU: No CVA tenderness.  No bladder fullness or masses.  Patient with circumcised/uncircumcised phallus. ***Foreskin easily retracted***  Urethral meatus is patent.  No penile discharge. No penile lesions or rashes. Scrotum without lesions, cysts, rashes and/or edema.  Testicles are located scrotally bilaterally. No masses are appreciated in the testicles. Left and right epididymis are normal. Rectal: Patient with  normal sphincter tone. Anus and perineum without scarring or rashes. No rectal masses are appreciated. Prostate is approximately *** grams, *** nodules are appreciated. Seminal vesicles are normal. Skin: No rashes, bruises or suspicious lesions. Lymph: No cervical or inguinal adenopathy. Neurologic: Grossly intact, no focal deficits, moving all 4 extremities. Psychiatric: Normal mood and affect.  Laboratory Data: See Epic and HPI   I have reviewed the labs.   Pertinent Imaging: N/A  Assessment & Plan:  ***  1.  Erectile dysfunction - Optimize reversible factors (glycemic control, BP control, weight). *** - Continue / initiate PDE5 inhibitor unless contraindicated.*** - If inadequate response: discuss vacuum device, ICI therapy, or penile prosthesis evaluation.***    No follow-ups on file.  These notes generated with voice recognition software. I apologize for typographical errors.  CLOTILDA HELON RIGGERS  [facility] [address]  [address] [place], [place] [address] [phone]     [1]   Allergies Allergen Reactions   Ramipril Cough and Other (See Comments)    REACTION: Cough  ramipril   Pseudoephedrine-Guaifenesin Er Other (See Comments) and Hypertension    Elevated BP, insomnia Elevated BP, insomnia   "

## 2024-08-23 ENCOUNTER — Ambulatory Visit: Admitting: Urology

## 2024-08-24 ENCOUNTER — Inpatient Hospital Stay

## 2024-08-24 ENCOUNTER — Inpatient Hospital Stay: Admitting: Oncology

## 2024-08-24 ENCOUNTER — Inpatient Hospital Stay: Attending: Oncology

## 2024-08-24 ENCOUNTER — Encounter: Payer: Self-pay | Admitting: Oncology

## 2024-08-24 VITALS — BP 137/75 | HR 53 | Temp 96.9°F | Resp 18 | Wt 218.7 lb

## 2024-08-24 VITALS — BP 142/70 | HR 54 | Resp 18

## 2024-08-24 DIAGNOSIS — Z800 Family history of malignant neoplasm of digestive organs: Secondary | ICD-10-CM | POA: Insufficient documentation

## 2024-08-24 DIAGNOSIS — Z807 Family history of other malignant neoplasms of lymphoid, hematopoietic and related tissues: Secondary | ICD-10-CM | POA: Insufficient documentation

## 2024-08-24 DIAGNOSIS — Z720 Tobacco use: Secondary | ICD-10-CM | POA: Diagnosis not present

## 2024-08-24 DIAGNOSIS — F17210 Nicotine dependence, cigarettes, uncomplicated: Secondary | ICD-10-CM | POA: Diagnosis not present

## 2024-08-24 DIAGNOSIS — R911 Solitary pulmonary nodule: Secondary | ICD-10-CM | POA: Diagnosis not present

## 2024-08-24 DIAGNOSIS — D751 Secondary polycythemia: Secondary | ICD-10-CM | POA: Insufficient documentation

## 2024-08-24 LAB — CBC (CANCER CENTER ONLY)
HCT: 57.7 % — ABNORMAL HIGH (ref 39.0–52.0)
Hemoglobin: 19.1 g/dL — ABNORMAL HIGH (ref 13.0–17.0)
MCH: 28.2 pg (ref 26.0–34.0)
MCHC: 33.1 g/dL (ref 30.0–36.0)
MCV: 85.2 fL (ref 80.0–100.0)
Platelet Count: 155 10*3/uL (ref 150–400)
RBC: 6.77 MIL/uL — ABNORMAL HIGH (ref 4.22–5.81)
RDW: 15.6 % — ABNORMAL HIGH (ref 11.5–15.5)
WBC Count: 7.4 10*3/uL (ref 4.0–10.5)
nRBC: 0 % (ref 0.0–0.2)

## 2024-08-24 NOTE — Patient Instructions (Signed)

## 2024-08-24 NOTE — Progress Notes (Signed)
 " Hematology/Oncology Progress note [phone] [phone]           REFERRING PROVIDER: Valora Lynwood FALCON, MD   CHIEF COMPLAINTS/REASON FOR VISIT:  Secondary erythrocytosis   ASSESSMENT & PLAN:   Erythrocytosis Labs are reviewed and discussed with patient.  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 negative JAK2 with reflex to other mutations,negative BCR-ABL1 FISH.     Recommend phlebotomy if Hct>=55.  Follow up in 3 months Recommend sleep study  Tobacco abuse Recommend smoke cessation.    Orders Placed This Encounter  Procedures   CBC (Cancer Center Only)    Standing Status:   Future    Expected Date:   11/22/2024    Expiration Date:   02/20/2025   Follow-up 3 months All questions were answered. The patient knows to call the clinic with any problems, questions or concerns.  Zelphia Cap, MD, PhD [facility] Hematology Oncology 08/24/2024   HISTORY OF PRESENTING ILLNESS:   Joshua Roy is a  74 y.o.  male with PMH listed below was seen in consultation at the request of  Valora Lynwood FALCON, MD  for evaluation of erythrocytosis.  Patient has a history of iron  deficiency anemia and previously was seen by Dr. Clista.  Patient received IV Feraheme  treatments with good response.  Had colonoscopy and upper endoscopy by Dr. Jinny in February 2024 which was unremarkable   Patient has a history of chronic kidney disease.  He follows up with Dr. Marcelino.   Patient was referred to establish care due to progressive erythrocytosis. 04/27/2024, CBC showed Hemoglobin of 19.2 with hematocrit of 58.8.  He has a significant smoking history and has attempted to reduce smoking. He also has a history of COPD. He has not undergone lung cancer screening CTs.  he underwent two sleep studies approximately two years ago, but the results were inconclusive. He has lost 50 pounds over the past year  and a half, which he believes has improved his sleep quality and reduced snoring. No current snoring or sleep apnea symptoms are reported, and he notes improved sleep quality after weight loss.   .INTERVAL HISTORY Joshua Roy is a 74 y.o. male who has above history reviewed by me today presents for follow up visit for secondary erythrocytosis.  He has cut back on smoking. 10 cigarettes per day  MEDICAL HISTORY:  Past Medical History:  Diagnosis Date   (HFpEF) heart failure with preserved ejection fraction (HCC)    a. 07/2019 Echo: EF 60-65%. Mod LVH. Gr1 DD. No rwma. Nl RV size/fxn. Mildly dil LA; b. 11/2019 Echo: EF 55-60%, no rwma, mild LVH. Nl RV size/fxn. Mod dil LA; c. 03/2022 Echo: EF 60-65%, sev LVH, mildly reduced RV fxn, RVSP 78.25mmHg, triv MR, mild-mod TR, Ao root 38mm.   ACE-inhibitor cough    Anemia    Anxiety    Arrhythmia    atrial flutter   Bronchitis 06/27/2016   Carotid arterial disease    a. 09/2019 Carotid U/S: RICA 80-99%, LICA 1-39%; b. 09/2019 R CEA.   CKD (chronic kidney disease), stage IV (HCC)    COPD (chronic obstructive pulmonary disease) (HCC)    cath, mild inf. hypokinesis EF 49%, 100% ROA   Coronary artery disease    a. 2001 Cath: LM 10, LAD 60p/m, D1 30, LCX 20p, RCA 169m/d-->Med Rx; b. 05/2017 Ex Mv: Ex time 5:46, antlat twi @ rest, fixed infarct w/ mild peri-infarct ischemia.  EF 38%; c. 08/2019 Cath: LM 40ost, 70d, LAD 59m, LCX 66m, RCA 80-90p, 166m; d. 09/2019 CABG x4: LIMA->LAD, VG->RI, VG->OM, VG->RPDA.   Diabetes mellitus type II    Hyperlipidemia    Hypertension    MI (myocardial infarction) (HCC) age 69   OA (osteoarthritis)    knee OA, injected 2012 by ortho   PAF (paroxysmal atrial fibrillation) (HCC)    a. 09/2019 post-op CABG-->Amio; b. 04/2022 s/p DCCV (150J).   Pneumonia    PSVT (paroxysmal supraventricular tachycardia)    a. 08/2019 Zio: 189 brief runs of SVT (fastest 174 x 6 beats, longest 15 beats @ 107).   Pulmonary hypertension  (HCC)    a. 03/2022 Echo: RVSP 78.74mmHg.   Tobacco abuse     SURGICAL HISTORY: Past Surgical History:  Procedure Laterality Date   CARDIAC CATHETERIZATION  05/06/2000   @ [facility]   CARDIOVERSION N/A 05/22/2022   Procedure: CARDIOVERSION;  Surgeon: Perla Evalene PARAS, MD;  Location: [facility] ORS;  Service: Cardiovascular;  Laterality: N/A;   COLONOSCOPY WITH PROPOFOL  N/A 04/20/2018   Procedure: COLONOSCOPY WITH PROPOFOL ;  Surgeon: Jinny Carmine, MD;  Location: [facility] ENDOSCOPY;  Service: Endoscopy;  Laterality: N/A;   COLONOSCOPY WITH PROPOFOL  N/A 09/02/2022   Procedure: COLONOSCOPY WITH PROPOFOL ;  Surgeon: Jinny Carmine, MD;  Location: [facility] ENDOSCOPY;  Service: Endoscopy;  Laterality: N/A;   CORONARY ARTERY BYPASS GRAFT N/A 10/07/2019   Procedure: CORONARY ARTERY BYPASS GRAFTING (CABG) using LIMA to LAD; Endoscopic harvesting of left greater saphenous vein: SVG to RAMUS; SVG to OM1; SVG to PDA.;  Surgeon: Lucas Dorise POUR, MD;  Location: [facility] OR;  Service: Open Heart Surgery;  Laterality: N/A;   ENDARTERECTOMY Right 10/07/2019   Procedure: ENDARTERECTOMY CAROTID;  Surgeon: Oris Krystal FALCON, MD;  Location: [facility] OR;  Service: Vascular;  Laterality: Right;   ENDOVEIN HARVEST OF GREATER SAPHENOUS VEIN Left 10/07/2019   Procedure: Jethro Rubins Of Greater Saphenous Vein;  Surgeon: Lucas Dorise POUR, MD;  Location: [facility] OR;  Service: Open Heart Surgery;  Laterality: Left;   ESOPHAGOGASTRODUODENOSCOPY N/A 09/02/2022   Procedure: ESOPHAGOGASTRODUODENOSCOPY (EGD);  Surgeon: Jinny Carmine, MD;  Location: [facility] ENDOSCOPY;  Service: Endoscopy;  Laterality: N/A;   ESOPHAGOGASTRODUODENOSCOPY ENDOSCOPY     KNEE ARTHROSCOPY  07/25/04   left   KNEE SURGERY  1969   right, open surgery- repair   RIGHT/LEFT HEART CATH AND CORONARY ANGIOGRAPHY Bilateral 09/08/2019   Procedure: RIGHT/LEFT HEART CATH AND CORONARY ANGIOGRAPHY;  Surgeon: Perla Evalene PARAS, MD;  Location: [facility] INVASIVE CV LAB;  Service: Cardiovascular;  Laterality: Bilateral;    TEE WITHOUT CARDIOVERSION N/A 10/07/2019   Procedure: TRANSESOPHAGEAL ECHOCARDIOGRAM (TEE);  Surgeon: Lucas Dorise POUR, MD;  Location: [facility] OR;  Service: Open Heart Surgery;  Laterality: N/A;   TOTAL KNEE ARTHROPLASTY Left 07/14/2016   Procedure: LEFT TOTAL KNEE ARTHROPLASTY;  Surgeon: Dempsey Moan, MD;  Location: [facility] ORS;  Service: Orthopedics;  Laterality: Left;   TOTAL KNEE ARTHROPLASTY Right 07/13/2017   Procedure: RIGHT TOTAL KNEE ARTHROPLASTY;  Surgeon: Moan Dempsey, MD;  Location: [facility] ORS;  Service: Orthopedics;  Laterality: Right;    SOCIAL HISTORY: Social History   Socioeconomic History   Marital status: Married    Spouse name: Janie   Number of children: 3   Years of education: Not on file   Highest education level: Not on file  Occupational History   Occupation: Geneticist, molecular, database administrator  Tobacco Use   Smoking status: Every Day    Current packs/day: 1.00  Average packs/day: 1 pack/day for 52.0 years (52.0 ttl pk-yrs)    Types: Cigarettes   Smokeless tobacco: Never  Vaping Use   Vaping status: Never Used  Substance and Sexual Activity   Alcohol use: Yes    Alcohol/week: 0.0 standard drinks of alcohol    Comment: 5-6 cocktails, 1 times a week   Drug use: No   Sexual activity: Not on file  Other Topics Concern   Not on file  Social History Narrative   From [place].  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   Social Drivers of Health   Tobacco Use: High Risk (08/24/2024)   Patient History    Smoking Tobacco Use: Every Day    Smokeless Tobacco Use: Never    Passive Exposure: Not on file  Financial Resource Strain: Low Risk  (01/28/2024)   Received from [facility]   Overall Financial Resource Strain (CARDIA)    Difficulty of Paying Living Expenses: Not hard at all  Food Insecurity: No Food Insecurity (01/28/2024)   Received from [facility]   Epic    Within the past 12 months, you worried that your food would run out  before you got the money to buy more.: Never true    Within the past 12 months, the food you bought just didn't last and you didn't have money to get more.: Never true  Transportation Needs: No Transportation Needs (01/28/2024)   Received from [facility] - Transportation    In the past 12 months, has lack of transportation kept you from medical appointments or from getting medications?: No    Lack of Transportation (Non-Medical): No  Physical Activity: Not on file  Stress: Not on file  Social Connections: Not on file  Intimate Partner Violence: Not on file  Depression (EYV7-0): Not on file  Alcohol Screen: Not on file  Housing: Low Risk  (01/28/2024)   Received from [facility]   Epic    In the last 12 months, was there a time when you were not able to pay the mortgage or rent on time?: No    In the past 12 months, how many times have you moved where you were living?: 0    At any time in the past 12 months, were you homeless or living in a shelter (including now)?: No  Utilities: Not At Risk (01/28/2024)   Received from [facility]   Epic    In the past 12 months has the electric, gas, oil, or water company threatened to shut off services in your home?: No  Health Literacy: Not on file    FAMILY HISTORY: Family History  Problem Relation Age of Onset   Heart disease Father        CAD, MI x 3   Hypertension Father    Alcohol abuse Father    Diabetes Father    Diabetes Sister    Cancer Brother        lymphoma, in remission   Colon cancer Brother    Heart disease Sister        CABG x 3   Prostate cancer Neg Hx     ALLERGIES:  is allergic to ramipril and pseudoephedrine-guaifenesin er.  MEDICATIONS:  Current Outpatient Medications  Medication Sig Dispense Refill   ALPRAZolam  (XANAX ) 0.25 MG tablet Take 1 tablet (0.25 mg total) by mouth 2 (two) times daily as needed for anxiety. 60 tablet 0  AMBULATORY NON  FORMULARY MEDICATION Trimix (30/1/10)-(Pap/Phent/PGE)  Test Dose  1ml vial   Qty #3 Refills 0  [facility] [phone] Fax [phone] 3 vial 0   AMBULATORY NON FORMULARY MEDICATION Medication Name: Trimix (30/1/10)-(Pap/Phent/PGE)  Dosage: Inject 0.6 cc per injection   Vial 1ml  Qty #10 Refills 6  [facility] [phone] Fax [phone] 10 mL 0   amiodarone  (PACERONE ) 200 MG tablet Take 1 tablet (200 mg total) by mouth daily. 90 tablet 3   apixaban  (ELIQUIS ) 5 MG TABS tablet Take 1 tablet (5 mg total) by mouth 2 (two) times daily. 180 tablet 3   atorvastatin  (LIPITOR ) 80 MG tablet Take 1 tablet (80 mg total) by mouth daily. 90 tablet 3   cyanocobalamin  (VITAMIN B12) 1000 MCG tablet Take 1,000 mcg by mouth daily.     dapagliflozin  propanediol (FARXIGA ) 10 MG TABS tablet Take 1 tablet (10 mg total) by mouth daily at 2 PM. 90 tablet 3   ferrous sulfate  325 (65 FE) MG EC tablet Take 1 tablet (325 mg total) by mouth every other day. 45 tablet 2   furosemide  (LASIX ) 40 MG tablet TAKE 2 TABLETS IN THE MORNING AND 1 TABLET AT NIGHT ( 80 MG IN THE AM, 40 MG IN PM) 270 tablet 0   glipiZIDE  (GLUCOTROL ) 5 MG tablet Take 5 mg by mouth daily before breakfast.     glucose blood (TRUE METRIX BLOOD GLUCOSE TEST) test strip 1 each by Other route daily. Use as instructed 100 each 12   metoprolol  tartrate (LOPRESSOR ) 25 MG tablet Take 1 tablet (25 mg total) by mouth 2 (two) times daily. 180 tablet 3   nitroGLYCERIN  (NITROSTAT ) 0.4 MG SL tablet Place 1 tablet (0.4 mg total) under the tongue every 5 (five) minutes as needed for chest pain. 25 tablet 3   pantoprazole  (PROTONIX ) 40 MG tablet TAKE 1 TABLET BY MOUTH DAILY BEFORE BREAKFAST 90 tablet 3   predniSONE  (STERAPRED UNI-PAK 48 TAB) 10 MG (48) TBPK tablet Take by mouth daily.     sildenafil  (VIAGRA ) 100 MG tablet Take 100 mg by mouth daily as needed.     No current facility-administered medications for this visit.    Review  of Systems  Constitutional:  Positive for fatigue. Negative for appetite change, chills, fever and unexpected weight change.  HENT:   Negative for hearing loss and voice change.   Eyes:  Negative for eye problems and icterus.  Respiratory:  Negative for chest tightness, cough and shortness of breath.   Cardiovascular:  Negative for chest pain and leg swelling.  Gastrointestinal:  Negative for abdominal distention and abdominal pain.  Endocrine: Negative for hot flashes.  Genitourinary:  Negative for difficulty urinating, dysuria and frequency.   Musculoskeletal:  Negative for arthralgias.  Skin:  Negative for itching and rash.  Neurological:  Negative for light-headedness and numbness.  Hematological:  Negative for adenopathy. Does not bruise/bleed easily.  Psychiatric/Behavioral:  Negative for confusion.    PHYSICAL EXAMINATION:  Vitals:   08/24/24 1028  BP: 137/75  Pulse: (!) 53  Resp: 18  Temp: (!) 96.9 F (36.1 C)  SpO2: 98%   Filed Weights   08/24/24 1028  Weight: 218 lb 11.2 oz (99.2 kg)    Physical Exam Constitutional:      General: He is not in acute distress.    Appearance: He is obese.  HENT:     Head: Normocephalic and atraumatic.  Eyes:     General: No scleral icterus. Cardiovascular:  Rate and Rhythm: Normal rate.  Pulmonary:     Effort: Pulmonary effort is normal. No respiratory distress.  Abdominal:     General: There is no distension.  Musculoskeletal:        General: Normal range of motion.     Cervical back: Normal range of motion and neck supple.  Neurological:     Mental Status: He is alert and oriented to person, place, and time. Mental status is at baseline.  Psychiatric:        Mood and Affect: Mood normal.     LABORATORY DATA:  I have reviewed the data as listed    Latest Ref Rng & Units 08/24/2024   10:10 AM 06/02/2024   12:42 PM 05/04/2024    4:09 PM  CBC  WBC 4.0 - 10.5 K/uL 7.4   7.2   Hemoglobin 13.0 - 17.0 g/dL 80.8  82.4   81.1   Hematocrit 39.0 - 52.0 % 57.7  52.0  56.2   Platelets 150 - 400 K/uL 155   123       Latest Ref Rng & Units 05/19/2022   10:55 AM 04/04/2022    4:23 AM 04/03/2022    5:11 AM  CMP  Glucose 70 - 99 mg/dL 852  850  856   BUN 8 - 23 mg/dL 36  57  62   Creatinine 0.61 - 1.24 mg/dL 7.43  6.74  6.48   Sodium 135 - 145 mmol/L 140  139  138   Potassium 3.5 - 5.1 mmol/L 3.7  4.1  4.4   Chloride 98 - 111 mmol/L 98  95  94   CO2 22 - 32 mmol/L 30  37  33   Calcium  8.9 - 10.3 mg/dL 9.5  9.2  9.6       RADIOGRAPHIC STUDIES: I have personally reviewed the radiological images as listed and agreed with the findings in the report. No results found.           "

## 2024-08-24 NOTE — Assessment & Plan Note (Signed)
Annual lung cancer screening.

## 2024-08-24 NOTE — Progress Notes (Signed)
 Joshua Roy presents today for phlebotomy per MD orders. Phlebotomy procedure started at 1100 and ended at 1115. 300 mls removed. Patient tolerated procedure well. IV needle removed intact.

## 2024-08-24 NOTE — Assessment & Plan Note (Signed)
 Recommend smoke cessation.

## 2024-08-24 NOTE — Assessment & Plan Note (Addendum)
 Labs are reviewed and discussed with patient.  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 negative JAK2 with reflex to other mutations,negative BCR-ABL1 FISH.     Recommend phlebotomy if Hct>=55.  Follow up in 3 months Recommend sleep study

## 2024-11-25 ENCOUNTER — Inpatient Hospital Stay

## 2024-11-25 ENCOUNTER — Inpatient Hospital Stay: Admitting: Oncology

## 2024-11-29 ENCOUNTER — Inpatient Hospital Stay

## 2024-11-29 ENCOUNTER — Inpatient Hospital Stay: Admitting: Oncology
